# Patient Record
Sex: Male | Born: 1940
Health system: Southern US, Community
[De-identification: ages and names within clinical notes are randomized; demographics above are authoritative.]

## PROBLEM LIST (undated history)

## (undated) DIAGNOSIS — D689 Coagulation defect, unspecified: Secondary | ICD-10-CM

## (undated) DIAGNOSIS — H409 Unspecified glaucoma: Secondary | ICD-10-CM

## (undated) DIAGNOSIS — C801 Malignant (primary) neoplasm, unspecified: Secondary | ICD-10-CM

## (undated) DIAGNOSIS — E785 Hyperlipidemia, unspecified: Secondary | ICD-10-CM

## (undated) DIAGNOSIS — M1990 Unspecified osteoarthritis, unspecified site: Secondary | ICD-10-CM

## (undated) DIAGNOSIS — I509 Heart failure, unspecified: Secondary | ICD-10-CM

## (undated) DIAGNOSIS — I10 Essential (primary) hypertension: Secondary | ICD-10-CM

## (undated) DIAGNOSIS — K219 Gastro-esophageal reflux disease without esophagitis: Secondary | ICD-10-CM

## (undated) DIAGNOSIS — T7840XA Allergy, unspecified, initial encounter: Secondary | ICD-10-CM

## (undated) HISTORY — PX: TRANSURETHRAL RESECTION OF PROSTATE: SHX73

## (undated) HISTORY — DX: Coagulation defect, unspecified: D68.9

## (undated) HISTORY — PX: COLONOSCOPY: SHX174

## (undated) HISTORY — DX: Hyperlipidemia, unspecified: E78.5

## (undated) HISTORY — DX: Allergy, unspecified, initial encounter: T78.40XA

## (undated) HISTORY — PX: NECK SURGERY: SHX720

## (undated) HISTORY — PX: PROSTATE SURGERY: SHX751

## (undated) HISTORY — PX: EYE SURGERY: SHX253

## (undated) HISTORY — PX: BACK SURGERY: SHX140

## (undated) HISTORY — DX: Heart failure, unspecified: I50.9

---

## 2004-05-04 ENCOUNTER — Inpatient Hospital Stay (HOSPITAL_COMMUNITY): Admission: EM | Admit: 2004-05-04 | Discharge: 2004-05-06 | Payer: Self-pay | Admitting: Emergency Medicine

## 2004-05-13 ENCOUNTER — Encounter: Admission: RE | Admit: 2004-05-13 | Discharge: 2004-05-13 | Payer: Self-pay | Admitting: Family Medicine

## 2005-06-09 ENCOUNTER — Inpatient Hospital Stay (HOSPITAL_COMMUNITY): Admission: RE | Admit: 2005-06-09 | Discharge: 2005-06-10 | Payer: Self-pay | Admitting: Neurosurgery

## 2006-01-22 ENCOUNTER — Inpatient Hospital Stay (HOSPITAL_COMMUNITY): Admission: RE | Admit: 2006-01-22 | Discharge: 2006-01-24 | Payer: Self-pay | Admitting: Neurosurgery

## 2008-09-30 ENCOUNTER — Emergency Department (HOSPITAL_COMMUNITY): Admission: EM | Admit: 2008-09-30 | Discharge: 2008-09-30 | Payer: Self-pay | Admitting: Emergency Medicine

## 2008-10-10 ENCOUNTER — Ambulatory Visit (HOSPITAL_BASED_OUTPATIENT_CLINIC_OR_DEPARTMENT_OTHER): Admission: RE | Admit: 2008-10-10 | Discharge: 2008-10-10 | Payer: Self-pay | Admitting: Urology

## 2009-04-28 ENCOUNTER — Emergency Department (HOSPITAL_COMMUNITY): Admission: EM | Admit: 2009-04-28 | Discharge: 2009-04-28 | Payer: Self-pay | Admitting: Emergency Medicine

## 2009-11-06 ENCOUNTER — Ambulatory Visit (HOSPITAL_BASED_OUTPATIENT_CLINIC_OR_DEPARTMENT_OTHER): Admission: RE | Admit: 2009-11-06 | Discharge: 2009-11-06 | Payer: Self-pay | Admitting: Surgery

## 2009-11-06 HISTORY — PX: HERNIA REPAIR: SHX51

## 2010-07-24 LAB — CBC AND DIFFERENTIAL
HCT: 46 % (ref 41–53)
Hemoglobin: 15.1 g/dL (ref 13.5–17.5)
Platelets: 247 10*3/uL (ref 150–399)

## 2010-07-24 LAB — BASIC METABOLIC PANEL
Glucose: 87 mg/dL
Sodium: 139 mmol/L (ref 137–147)

## 2010-07-24 LAB — LIPID PANEL
HDL: 72 mg/dL — AB (ref 35–70)
LDL Cholesterol: 137 mg/dL
Triglycerides: 74 mg/dL (ref 40–160)

## 2010-07-24 LAB — VITAMIN D 25 HYDROXY (VIT D DEFICIENCY, FRACTURES): Vit D, 25-Hydroxy: 37

## 2010-07-24 LAB — PSA, TOTAL AND FREE: PSA: 5.7 ng/mL

## 2010-07-24 LAB — HEPATIC FUNCTION PANEL
AST: 16 U/L (ref 14–40)
Alkaline Phosphatase: 107 U/L (ref 25–125)

## 2010-11-25 ENCOUNTER — Ambulatory Visit
Admission: RE | Admit: 2010-11-25 | Discharge: 2010-12-17 | Payer: Self-pay | Source: Home / Self Care | Attending: Radiation Oncology | Admitting: Radiation Oncology

## 2010-11-25 ENCOUNTER — Ambulatory Visit (HOSPITAL_COMMUNITY): Admission: RE | Admit: 2010-11-25 | Payer: Self-pay | Source: Home / Self Care | Admitting: Radiation Oncology

## 2010-12-13 ENCOUNTER — Encounter
Admission: RE | Admit: 2010-12-13 | Discharge: 2010-12-13 | Payer: Self-pay | Source: Home / Self Care | Attending: Urology | Admitting: Urology

## 2010-12-18 ENCOUNTER — Ambulatory Visit: Payer: Medicare Other | Admitting: Radiation Oncology

## 2010-12-31 LAB — CBC
HCT: 45.4 % (ref 39.0–52.0)
Hemoglobin: 16 g/dL (ref 13.0–17.0)
MCH: 32.8 pg (ref 26.0–34.0)
MCV: 93 fL (ref 78.0–100.0)
RBC: 4.88 MIL/uL (ref 4.22–5.81)

## 2010-12-31 LAB — COMPREHENSIVE METABOLIC PANEL
ALT: 19 U/L (ref 0–53)
Albumin: 4 g/dL (ref 3.5–5.2)
BUN: 17 mg/dL (ref 6–23)
CO2: 30 mEq/L (ref 19–32)
Calcium: 10.5 mg/dL (ref 8.4–10.5)
Chloride: 103 mEq/L (ref 96–112)
Creatinine, Ser: 1.28 mg/dL (ref 0.4–1.5)
Potassium: 4.7 mEq/L (ref 3.5–5.1)
Sodium: 141 mEq/L (ref 135–145)
Total Protein: 7.4 g/dL (ref 6.0–8.3)

## 2010-12-31 LAB — PROTIME-INR: Prothrombin Time: 13.2 seconds (ref 11.6–15.2)

## 2011-01-07 ENCOUNTER — Ambulatory Visit (HOSPITAL_BASED_OUTPATIENT_CLINIC_OR_DEPARTMENT_OTHER)
Admission: RE | Admit: 2011-01-07 | Discharge: 2011-01-07 | Disposition: A | Discharge status: Home / Self Care | Payer: Medicare Other | Attending: Urology | Admitting: Urology

## 2011-01-07 ENCOUNTER — Ambulatory Visit (HOSPITAL_COMMUNITY): Payer: Medicare Other | Attending: Urology

## 2011-01-07 ENCOUNTER — Ambulatory Visit (HOSPITAL_COMMUNITY): Payer: Medicare Other

## 2011-01-07 DIAGNOSIS — Z01812 Encounter for preprocedural laboratory examination: Secondary | ICD-10-CM | POA: Insufficient documentation

## 2011-01-07 DIAGNOSIS — J9819 Other pulmonary collapse: Secondary | ICD-10-CM | POA: Insufficient documentation

## 2011-01-07 DIAGNOSIS — Z9889 Other specified postprocedural states: Secondary | ICD-10-CM | POA: Insufficient documentation

## 2011-01-07 DIAGNOSIS — Z01818 Encounter for other preprocedural examination: Secondary | ICD-10-CM | POA: Insufficient documentation

## 2011-01-07 DIAGNOSIS — C61 Malignant neoplasm of prostate: Secondary | ICD-10-CM | POA: Insufficient documentation

## 2011-01-09 ENCOUNTER — Emergency Department (HOSPITAL_COMMUNITY): Payer: Medicare Other

## 2011-01-09 ENCOUNTER — Emergency Department (HOSPITAL_COMMUNITY)
Admission: EM | Admit: 2011-01-09 | Discharge: 2011-01-09 | Disposition: A | Discharge status: Home / Self Care | Payer: Medicare Other | Attending: Emergency Medicine | Admitting: Emergency Medicine

## 2011-01-09 DIAGNOSIS — I749 Embolism and thrombosis of unspecified artery: Secondary | ICD-10-CM | POA: Insufficient documentation

## 2011-01-09 DIAGNOSIS — I10 Essential (primary) hypertension: Secondary | ICD-10-CM | POA: Insufficient documentation

## 2011-01-09 DIAGNOSIS — R079 Chest pain, unspecified: Secondary | ICD-10-CM | POA: Insufficient documentation

## 2011-01-09 DIAGNOSIS — C61 Malignant neoplasm of prostate: Secondary | ICD-10-CM | POA: Insufficient documentation

## 2011-01-09 DIAGNOSIS — Y849 Medical procedure, unspecified as the cause of abnormal reaction of the patient, or of later complication, without mention of misadventure at the time of the procedure: Secondary | ICD-10-CM | POA: Insufficient documentation

## 2011-01-09 DIAGNOSIS — R911 Solitary pulmonary nodule: Secondary | ICD-10-CM | POA: Insufficient documentation

## 2011-01-09 DIAGNOSIS — R05 Cough: Secondary | ICD-10-CM | POA: Insufficient documentation

## 2011-01-09 DIAGNOSIS — T85898A Other specified complication of other internal prosthetic devices, implants and grafts, initial encounter: Secondary | ICD-10-CM | POA: Insufficient documentation

## 2011-01-09 DIAGNOSIS — R059 Cough, unspecified: Secondary | ICD-10-CM | POA: Insufficient documentation

## 2011-01-09 LAB — URINALYSIS, ROUTINE W REFLEX MICROSCOPIC
Bilirubin Urine: NEGATIVE
Ketones, ur: NEGATIVE mg/dL
Leukocytes, UA: NEGATIVE
Nitrite: NEGATIVE
Protein, ur: NEGATIVE mg/dL
Specific Gravity, Urine: 1.012 (ref 1.005–1.030)
Urine Glucose, Fasting: NEGATIVE mg/dL
Urobilinogen, UA: 0.2 mg/dL (ref 0.0–1.0)
pH: 7 (ref 5.0–8.0)

## 2011-01-09 LAB — CBC
HCT: 44.3 % (ref 39.0–52.0)
Hemoglobin: 15.1 g/dL (ref 13.0–17.0)
MCH: 32.3 pg (ref 26.0–34.0)
MCHC: 34.1 g/dL (ref 30.0–36.0)
MCV: 94.9 fL (ref 78.0–100.0)
Platelets: 225 K/uL (ref 150–400)
RBC: 4.67 MIL/uL (ref 4.22–5.81)
RDW: 14.7 % (ref 11.5–15.5)
WBC: 10 K/uL (ref 4.0–10.5)

## 2011-01-09 LAB — DIFFERENTIAL
Eosinophils Absolute: 0.2 10*3/uL (ref 0.0–0.7)
Neutrophils Relative %: 60 % (ref 43–77)

## 2011-01-09 LAB — LIPASE, BLOOD: Lipase: 17 U/L (ref 11–59)

## 2011-01-09 LAB — COMPREHENSIVE METABOLIC PANEL WITH GFR
ALT: 17 U/L (ref 0–53)
AST: 24 U/L (ref 0–37)
Albumin: 3.6 g/dL (ref 3.5–5.2)
Alkaline Phosphatase: 87 U/L (ref 39–117)
BUN: 11 mg/dL (ref 6–23)
CO2: 29 meq/L (ref 19–32)
Calcium: 9.6 mg/dL (ref 8.4–10.5)
Chloride: 103 meq/L (ref 96–112)
Creatinine, Ser: 1.16 mg/dL (ref 0.4–1.5)
GFR calc Af Amer: >60 mL/min (ref >60)
GFR calc non Af Amer: >60 mL/min (ref >60)
Glucose, Bld: 88 mg/dL (ref 70–99)
Potassium: 3.9 meq/L (ref 3.5–5.1)
Sodium: 138 meq/L (ref 135–145)
Total Bilirubin: 1 mg/dL (ref 0.3–1.2)
Total Protein: 7.3 g/dL (ref 6.0–8.3)

## 2011-01-09 LAB — URINE MICROSCOPIC-ADD ON

## 2011-01-09 MED ORDER — IOHEXOL 300 MG/ML  SOLN: 100.0000 mL | Freq: Once | INTRAMUSCULAR | Status: DC | PRN

## 2011-01-09 NOTE — Op Note (Signed)
NAME:  Joshua Compton, Joshua Compton NO.:  [id number]  MEDICAL RECORD NO.:  [id number]           PATIENT TYPE:  LOCATION:                                 FACILITY:  PHYSICIAN:  Excell Seltzer. Annabell Howells, M.D.    DATE OF BIRTH:  1941/06/02  DATE OF PROCEDURE:  01/08/2011 DATE OF DISCHARGE:                              OPERATIVE REPORT   PROCEDURE:  Prostate seed implantation.  PREOPERATIVE DIAGNOSIS:  T2A Gleason 6 adenocarcinoma of the prostate.  POSTOPERATIVE DIAGNOSIS:  T2A Gleason 6 adenocarcinoma of the prostate.  SURGEON:  Excell Seltzer. Annabell Howells, M.D.  ANESTHESIA:  General.  DRAIN:  Foley catheter.  COMPLICATIONS:  None.  INDICATIONS:  Mr. Joshua Compton is a 70 year old African-American male with a T28 Gleason 6 adenocarcinoma the prostate.  His prostate measures 40 cc and he has elected seed implantation.  FINDINGS OF PROCEDURE:  He was given Cipro and taken to the operating room where he was fitted with PAS hose.  A general anesthetic wasinduced.  He was placed in a lithotomy position.  His genitalia was prepped with Betadine solution and a Foley catheter was inserted.  The balloon was filled with dilute contrast.  A dressing was applied to the genitalia to reflect the scrotum out of the perineum.  The perineum was then clipped and a red rubber rectal catheter was placed.  The ultrasound probe was assembled and inserted and secured to the fixed base.  Once in proper position, the perineum was prepped with Betadine solution and a sterile needle grid was placed on the ultrasound carrier.  At this point, Dr. Roselind Messier and the radiation physicist performed prostate imaging with contouring of the prostate to perform the treatment plan with a Nucletron.  During this procedure, there were some issues imaging the base the prostate, so I was asked to assist.  The bladder was filled with approximately 100 cc of fluid which helped to better delineate the base of the prostate.  He had an  intravesical protrusion that was anterior from the prostate which made imaging of the base difficult until the fluid was in the bladder.  However, once this maneuver had been completed, the contour was performed successfully and the treatment plan was created.  At this point, the patient was draped in usual sterile fashion and the seed implant was performed.  Of note, stabilization needles had been placed prior to contouring.  A total of 26 needles were used with total of 71 iodine-125 seeds were placed according to the intraoperative treatment plan.  The implant was uneventful.  At completion of the implant, the stabilization needles were removed. The ultrasound probe was removed and fluoroscopic imaging was obtained to determine the initial seed placement.  Once this had been completed, the genital drapes were removed.  The penis was reprepped after Foley had been removed and cystoscopy was performed.  This study revealed a normal urethra.  The external sphincter was intact.  The prostatic urethra had no evidence of bleeding or seeds.  There was some bilobar hyperplasia with mild obstruction and an anterior middle lobe.  Examination of bladder revealed mild trabeculation.  No tumors, stones or retained  seeds were noted. Ureteral orifices were unremarkable.  The cystoscope was removed and a fresh 16-French Foley catheter was inserted.  Balloon was filled with 10 cc of sterile fluid and the catheter was placed to straight drainage.  At this point, the patient was taken down from lithotomy position.  His anesthetic was reversed. He was taken to recovery room in stable condition.  There were no complications during the procedure but the imaging was somewhat difficult requiring the maneuver noted above.     Excell Seltzer. Annabell Howells, M.D.     JJW/MEDQ  D:  01/08/2011  T:  01/08/2011  Job:  [id number]  cc:   Billie Lade, M.D. Fax: [phone]  Electronically Signed by Bjorn Pippin M.D. on  01/09/2011 08:20:07 AM

## 2011-02-03 ENCOUNTER — Ambulatory Visit: Payer: Medicare Other | Attending: Radiation Oncology | Admitting: Radiation Oncology

## 2011-02-04 ENCOUNTER — Encounter: Payer: Self-pay | Admitting: Family Medicine

## 2011-02-04 DIAGNOSIS — C61 Malignant neoplasm of prostate: Secondary | ICD-10-CM | POA: Insufficient documentation

## 2011-02-04 DIAGNOSIS — K922 Gastrointestinal hemorrhage, unspecified: Secondary | ICD-10-CM | POA: Insufficient documentation

## 2011-02-04 DIAGNOSIS — I10 Essential (primary) hypertension: Secondary | ICD-10-CM | POA: Insufficient documentation

## 2011-02-04 DIAGNOSIS — Z8546 Personal history of malignant neoplasm of prostate: Secondary | ICD-10-CM | POA: Insufficient documentation

## 2011-02-04 DIAGNOSIS — IMO0002 Reserved for concepts with insufficient information to code with codable children: Secondary | ICD-10-CM | POA: Insufficient documentation

## 2011-02-04 DIAGNOSIS — D649 Anemia, unspecified: Secondary | ICD-10-CM | POA: Insufficient documentation

## 2011-02-17 ENCOUNTER — Ambulatory Visit: Payer: Medicare Other | Attending: Radiation Oncology | Admitting: Radiation Oncology

## 2011-02-17 DIAGNOSIS — C61 Malignant neoplasm of prostate: Secondary | ICD-10-CM | POA: Insufficient documentation

## 2011-02-17 DIAGNOSIS — J984 Other disorders of lung: Secondary | ICD-10-CM | POA: Insufficient documentation

## 2011-02-17 LAB — BASIC METABOLIC PANEL
BUN: 14 mg/dL (ref 6–23)
CO2: 29 mEq/L (ref 19–32)
Calcium: 9.7 mg/dL (ref 8.4–10.5)
Chloride: 100 mEq/L (ref 96–112)
Creatinine, Ser: 0.99 mg/dL (ref 0.4–1.5)
GFR calc Af Amer: >60 mL/min (ref >60)
GFR calc non Af Amer: >60 mL/min (ref >60)
Glucose, Bld: 96 mg/dL (ref 70–99)
Potassium: 4.6 mEq/L (ref 3.5–5.1)
Sodium: 137 mEq/L (ref 135–145)

## 2011-02-17 LAB — POCT HEMOGLOBIN-HEMACUE: Hemoglobin: 14.1 g/dL (ref 13.0–17.0)

## 2011-04-01 NOTE — Op Note (Signed)
NAME:  Joshua Compton, Joshua Compton           ACCOUNT NO.:  [id number]   MEDICAL RECORD NO.:  [id number]          PATIENT TYPE:  AMB   LOCATION:  [facility]                         FACILITY:  [facility]   PHYSICIAN:  Excell Seltzer. Annabell Howells, M.D.    DATE OF BIRTH:  August 07, 1941   DATE OF PROCEDURE:  10/10/2008  DATE OF DISCHARGE:                               OPERATIVE REPORT   PROCEDURE:  Transurethral electrovaporization of the prostate.   PREOPERATIVE DIAGNOSES:  1. Benign prostatic hypertrophy.  2. Bladder outlet obstruction.   POSTOPERATIVE DIAGNOSES:  1. Benign prostatic hypertrophy.  2. Bladder outlet obstruction.   SURGEON:  Dr. Bjorn Pippin.   ANESTHESIA:  General.   DRAIN:  A 20-French Foley catheter.   COMPLICATIONS:  None.   INDICATIONS:  Joshua Compton is a 70 year old African American male with  urinary retention who was found to have a tight bladder neck with  obstruction, and it was felt that he would benefit from TUIP or possibly  TURP.  He elected surgical therapy over medical therapy.   FINDINGS AND PROCEDURE:  The patient was taken to the operating room.  He was given Cipro.  A general anesthetic was induced.  He was placed in  lithotomy position.  His Foley catheter was removed.  He was prepped  with Betadine solution and draped in the usual sterile fashion.  His  urethra was calibrated to 30-French with Sissy Hoff sounds and a 28-  French continuous flow resectoscope sheath was inserted without  difficulty.  This was fitted with an Wandra Scot handle with a Gyrus button  electrode and a 12 degrees lens.  The irrigant was saline.  Inspection  revealed a tight bladder neck as noted above.  He had some catheter  irritation in the bladder, particularly on the posterior wall.  The  ureteral orifices were well away from the bladder neck.   Once inspection had been completed, the resection was initiated with an  initial pass at 6 o'clock.  This was carried down to the bladder neck  fibers and the  lateral lobes then fell in and I felt simple TUIP was not  to be adequate for opening the channel.  He then underwent  electrovaporization of the right and left lateral lobes, sufficient to  create an adequate channel for urine flow.  The posterior channel was  deepened from the bladder neck fibers and carried out to alongside the  verumontanum on each side.  Once an adequate channel had been created  with electrovaporization, the ablated surface was cauterized on the  cautery setting to ensure hemostasis.  At this point, the resectoscope  was removed and pressure on the bladder produced an excellent stream.  A  20-French Foley catheter was inserted.  The  balloon was filled with 10 mL of sterile fluid.  The catheter was  irrigated with an Asepto syringe with clear return.  A B and O  suppository was placed.  The patient was taken down from lithotomy  position.  His anesthetic was reversed.  He was moved to the recovery  room in stable condition.  There were no complications.  Excell Seltzer. Annabell Howells, M.D.  Electronically Signed     JJW/MEDQ  D:  10/10/2008  T:  10/11/2008  Job:  [id number]

## 2011-08-20 LAB — URINALYSIS, ROUTINE W REFLEX MICROSCOPIC
Bilirubin Urine: NEGATIVE
Glucose, UA: NEGATIVE
Hgb urine dipstick: NEGATIVE
Ketones, ur: NEGATIVE
Nitrite: NEGATIVE
Protein, ur: NEGATIVE
Specific Gravity, Urine: 1.014
Urobilinogen, UA: 0.2
pH: 6.5

## 2011-08-20 LAB — DIFFERENTIAL
Basophils Absolute: 0.1
Basophils Relative: 1
Eosinophils Absolute: 0.2
Eosinophils Relative: 3
Lymphocytes Relative: 36
Lymphs Abs: 2.5
Monocytes Absolute: 0.5
Monocytes Relative: 7
Neutro Abs: 3.8
Neutrophils Relative %: 53

## 2011-08-20 LAB — COMPREHENSIVE METABOLIC PANEL
ALT: 14
AST: 20
Albumin: 3.9
Alkaline Phosphatase: 90
BUN: 9
CO2: 26
Calcium: 9.9
Chloride: 102
Creatinine, Ser: 1.15
GFR calc Af Amer: >60
GFR calc non Af Amer: >60
Glucose, Bld: 93
Potassium: 3.8
Sodium: 137
Total Bilirubin: 0.9
Total Protein: 6.9

## 2011-08-20 LAB — LIPASE, BLOOD: Lipase: 15

## 2011-08-20 LAB — CBC
HCT: 43.5
Hemoglobin: 14.3
MCHC: 32.8
MCV: 96.3
Platelets: 278
RBC: 4.52
RDW: 14.7
WBC: 7.2

## 2011-08-20 LAB — POCT I-STAT, CHEM 8
BUN: 16
Calcium, Ion: 1.17
Chloride: 104
Creatinine, Ser: 1.1
Glucose, Bld: 84
HCT: 45
Hemoglobin: 15.3
Potassium: 4
Sodium: 140
TCO2: 29

## 2011-08-25 ENCOUNTER — Ambulatory Visit: Payer: Medicare Other | Admitting: Radiation Oncology

## 2011-10-27 DIAGNOSIS — H401190 Primary open-angle glaucoma, unspecified eye, stage unspecified: Secondary | ICD-10-CM | POA: Insufficient documentation

## 2012-03-26 DIAGNOSIS — H2510 Age-related nuclear cataract, unspecified eye: Secondary | ICD-10-CM | POA: Insufficient documentation

## 2012-03-26 DIAGNOSIS — H409 Unspecified glaucoma: Secondary | ICD-10-CM | POA: Insufficient documentation

## 2012-04-29 ENCOUNTER — Other Ambulatory Visit: Payer: Self-pay | Admitting: Neurosurgery

## 2012-04-29 DIAGNOSIS — IMO0002 Reserved for concepts with insufficient information to code with codable children: Secondary | ICD-10-CM

## 2012-05-12 ENCOUNTER — Ambulatory Visit
Admission: RE | Admit: 2012-05-12 | Discharge: 2012-05-12 | Disposition: A | Discharge status: Home / Self Care | Payer: Medicare Other | Source: Ambulatory Visit | Attending: Neurosurgery | Admitting: Neurosurgery

## 2012-05-12 DIAGNOSIS — IMO0002 Reserved for concepts with insufficient information to code with codable children: Secondary | ICD-10-CM

## 2012-05-12 MED ORDER — GADOBENATE DIMEGLUMINE 529 MG/ML IV SOLN
19.0000 mL | Freq: Once | INTRAVENOUS | Status: AC | PRN
  Administered 2012-05-12: 19 mL via INTRAVENOUS

## 2012-05-17 ENCOUNTER — Other Ambulatory Visit: Payer: Self-pay | Admitting: Neurosurgery

## 2012-06-03 ENCOUNTER — Encounter (HOSPITAL_COMMUNITY)
Admission: RE | Admit: 2012-06-03 | Discharge: 2012-06-03 | Disposition: A | Discharge status: Home / Self Care | Payer: Medicare Other | Source: Ambulatory Visit | Attending: Neurosurgery | Admitting: Neurosurgery

## 2012-06-03 ENCOUNTER — Encounter (HOSPITAL_COMMUNITY): Payer: Self-pay

## 2012-06-03 ENCOUNTER — Other Ambulatory Visit: Payer: Self-pay | Admitting: Neurosurgery

## 2012-06-03 DIAGNOSIS — M48061 Spinal stenosis, lumbar region without neurogenic claudication: Secondary | ICD-10-CM

## 2012-06-03 DIAGNOSIS — M47817 Spondylosis without myelopathy or radiculopathy, lumbosacral region: Secondary | ICD-10-CM

## 2012-06-03 DIAGNOSIS — M5137 Other intervertebral disc degeneration, lumbosacral region: Secondary | ICD-10-CM

## 2012-06-03 DIAGNOSIS — M51379 Other intervertebral disc degeneration, lumbosacral region without mention of lumbar back pain or lower extremity pain: Secondary | ICD-10-CM

## 2012-06-03 HISTORY — DX: Unspecified glaucoma: H40.9

## 2012-06-03 HISTORY — DX: Essential (primary) hypertension: I10

## 2012-06-03 HISTORY — DX: Malignant (primary) neoplasm, unspecified: C80.1

## 2012-06-03 HISTORY — DX: Unspecified osteoarthritis, unspecified site: M19.90

## 2012-06-03 LAB — TYPE AND SCREEN
ABO/RH(D): O POS
Antibody Screen: NEGATIVE

## 2012-06-03 LAB — CBC
HCT: 42.4 % (ref 39.0–52.0)
Hemoglobin: 14.6 g/dL (ref 13.0–17.0)
MCH: 31.4 pg (ref 26.0–34.0)
MCHC: 34.4 g/dL (ref 30.0–36.0)
MCV: 91.2 fL (ref 78.0–100.0)
Platelets: 258 10*3/uL (ref 150–400)
RBC: 4.65 MIL/uL (ref 4.22–5.81)
RDW: 14.5 % (ref 11.5–15.5)
WBC: 6.9 10*3/uL (ref 4.0–10.5)

## 2012-06-03 LAB — SURGICAL PCR SCREEN
MRSA, PCR: NEGATIVE
Staphylococcus aureus: NEGATIVE

## 2012-06-03 LAB — BASIC METABOLIC PANEL
BUN: 20 mg/dL (ref 6–23)
CO2: 26 mEq/L (ref 19–32)
Calcium: 9.8 mg/dL (ref 8.4–10.5)
Chloride: 101 mEq/L (ref 96–112)
Creatinine, Ser: 1.01 mg/dL (ref 0.50–1.35)
GFR calc Af Amer: 84 mL/min — ABNORMAL LOW (ref >90)
GFR calc non Af Amer: 73 mL/min — ABNORMAL LOW (ref >90)
Glucose, Bld: 82 mg/dL (ref 70–99)
Potassium: 4 mEq/L (ref 3.5–5.1)
Sodium: 137 mEq/L (ref 135–145)

## 2012-06-03 LAB — ABO/RH: ABO/RH(D): O POS

## 2012-06-03 NOTE — Pre-Procedure Instructions (Signed)
20 Joshua Compton  06/03/2012   Your procedure is scheduled on:  06/09/12  Report to [facility] at 630 AM.  Call this number if you have problems the morning of surgery: [phone]   Remember:   Do not eat food:After Midnight.  May have clear liquids:until Midnight .  Clear liquids include soda, tea, black coffee, apple or grape juice, broth.  Take these medicines the morning of surgery with A SIP OF WATER: plendil   Do not wear jewelry, make-up or nail polish.  Do not wear lotions, powders, or perfumes. You may wear deodorant.  Do not shave 48 hours prior to surgery. Men may shave face and neck.  Do not bring valuables to the hospital.  Contacts, dentures or bridgework may not be worn into surgery.  Leave suitcase in the car. After surgery it may be brought to your room.  For patients admitted to the hospital, checkout time is 11:00 AM the day of discharge.   Patients discharged the day of surgery will not be allowed to drive home.  Name and phone number of your driver: family  Special Instructions: CHG Shower Use Special Wash: 1/2 bottle night before surgery and 1/2 bottle morning of surgery.   Please read over the following fact sheets that you were given: Pain Booklet, Coughing and Deep Breathing, Blood Transfusion Information, MRSA Information and Surgical Site Infection Prevention

## 2012-06-04 ENCOUNTER — Ambulatory Visit
Admission: RE | Admit: 2012-06-04 | Discharge: 2012-06-04 | Disposition: A | Discharge status: Home / Self Care | Payer: Medicare Other | Source: Ambulatory Visit | Attending: Neurosurgery | Admitting: Neurosurgery

## 2012-06-04 DIAGNOSIS — M48061 Spinal stenosis, lumbar region without neurogenic claudication: Secondary | ICD-10-CM

## 2012-06-04 DIAGNOSIS — M5137 Other intervertebral disc degeneration, lumbosacral region: Secondary | ICD-10-CM

## 2012-06-04 DIAGNOSIS — M47817 Spondylosis without myelopathy or radiculopathy, lumbosacral region: Secondary | ICD-10-CM

## 2012-06-08 MED ORDER — CEFAZOLIN SODIUM-DEXTROSE 2-3 GM-% IV SOLR
2.0000 g | INTRAVENOUS | Status: AC
  Administered 2012-06-09 (×2): 2 g via INTRAVENOUS
  Filled 2012-06-08: qty 50

## 2012-06-09 ENCOUNTER — Encounter (HOSPITAL_COMMUNITY)
Admission: RE | Disposition: A | Discharge status: Home / Self Care | Payer: Self-pay | Source: Ambulatory Visit | Attending: Neurosurgery

## 2012-06-09 ENCOUNTER — Encounter (HOSPITAL_COMMUNITY): Payer: Self-pay | Admitting: Certified Registered"

## 2012-06-09 ENCOUNTER — Inpatient Hospital Stay (HOSPITAL_COMMUNITY)
Admission: RE | Admit: 2012-06-09 | Discharge: 2012-06-10 | DRG: 460 | Disposition: A | Discharge status: Home / Self Care | Payer: Medicare Other | Source: Ambulatory Visit | Attending: Neurosurgery | Admitting: Neurosurgery

## 2012-06-09 ENCOUNTER — Encounter (HOSPITAL_COMMUNITY): Payer: Self-pay | Admitting: *Deleted

## 2012-06-09 ENCOUNTER — Inpatient Hospital Stay (HOSPITAL_COMMUNITY): Payer: Medicare Other

## 2012-06-09 ENCOUNTER — Inpatient Hospital Stay (HOSPITAL_COMMUNITY): Payer: Medicare Other | Admitting: Certified Registered"

## 2012-06-09 DIAGNOSIS — K219 Gastro-esophageal reflux disease without esophagitis: Secondary | ICD-10-CM | POA: Diagnosis present

## 2012-06-09 DIAGNOSIS — Z01812 Encounter for preprocedural laboratory examination: Secondary | ICD-10-CM

## 2012-06-09 DIAGNOSIS — I10 Essential (primary) hypertension: Secondary | ICD-10-CM | POA: Diagnosis present

## 2012-06-09 DIAGNOSIS — Z8546 Personal history of malignant neoplasm of prostate: Secondary | ICD-10-CM

## 2012-06-09 DIAGNOSIS — Z87891 Personal history of nicotine dependence: Secondary | ICD-10-CM

## 2012-06-09 DIAGNOSIS — M5137 Other intervertebral disc degeneration, lumbosacral region: Principal | ICD-10-CM | POA: Diagnosis present

## 2012-06-09 DIAGNOSIS — M51379 Other intervertebral disc degeneration, lumbosacral region without mention of lumbar back pain or lower extremity pain: Principal | ICD-10-CM | POA: Diagnosis present

## 2012-06-09 SURGERY — POSTERIOR LUMBAR FUSION 1 LEVEL
Anesthesia: General | Site: Back | Wound class: Clean

## 2012-06-09 MED ORDER — ENALAPRIL-HYDROCHLOROTHIAZIDE 5-12.5 MG PO TABS: 1.0000 | ORAL_TABLET | Freq: Every day | ORAL | Status: DC

## 2012-06-09 MED ORDER — KETOROLAC TROMETHAMINE 30 MG/ML IJ SOLN
INTRAMUSCULAR | Status: AC
  Filled 2012-06-09: qty 1

## 2012-06-09 MED ORDER — PHENYLEPHRINE HCL 10 MG/ML IJ SOLN
INTRAMUSCULAR | Status: DC | PRN
  Administered 2012-06-09 (×4): 40 ug via INTRAVENOUS

## 2012-06-09 MED ORDER — HETASTARCH-ELECTROLYTES 6 % IV SOLN
INTRAVENOUS | Status: DC | PRN
  Administered 2012-06-09: 11:00:00 via INTRAVENOUS

## 2012-06-09 MED ORDER — OXYCODONE HCL 5 MG PO TABS: 5.0000 mg | ORAL_TABLET | ORAL | Status: DC | PRN

## 2012-06-09 MED ORDER — TIMOLOL HEMIHYDRATE 0.5 % OP SOLN
1.0000 [drp] | Freq: Every day | OPHTHALMIC | Status: DC
Start: 2012-06-09 — End: 2012-06-09

## 2012-06-09 MED ORDER — LIDOCAINE HCL (CARDIAC) 20 MG/ML IV SOLN
INTRAVENOUS | Status: DC | PRN
  Administered 2012-06-09: 60 mg via INTRAVENOUS

## 2012-06-09 MED ORDER — FENTANYL CITRATE 0.05 MG/ML IJ SOLN
INTRAMUSCULAR | Status: DC | PRN
Start: 2012-06-09 — End: 2012-06-09
  Administered 2012-06-09: 50 ug via INTRAVENOUS
  Administered 2012-06-09: 100 ug via INTRAVENOUS
  Administered 2012-06-09 (×4): 50 ug via INTRAVENOUS
  Administered 2012-06-09: 100 ug via INTRAVENOUS
  Administered 2012-06-09 (×2): 50 ug via INTRAVENOUS

## 2012-06-09 MED ORDER — ENALAPRIL MALEATE 5 MG PO TABS
5.0000 mg | ORAL_TABLET | Freq: Every day | ORAL | Status: DC
  Filled 2012-06-09: qty 1

## 2012-06-09 MED ORDER — BACITRACIN 50000 UNITS IM SOLR
INTRAMUSCULAR | Status: AC
  Filled 2012-06-09: qty 1

## 2012-06-09 MED ORDER — THROMBIN 20000 UNITS EX KIT
PACK | CUTANEOUS | Status: DC | PRN
  Administered 2012-06-09: 20000 [IU] via TOPICAL

## 2012-06-09 MED ORDER — OXYCODONE-ACETAMINOPHEN 5-325 MG PO TABS: 1.0000 | ORAL_TABLET | ORAL | Status: DC | PRN

## 2012-06-09 MED ORDER — SODIUM CHLORIDE 0.9 % IJ SOLN: 3.0000 mL | INTRAMUSCULAR | Status: DC | PRN

## 2012-06-09 MED ORDER — ACETAMINOPHEN 325 MG PO TABS: 650.0000 mg | ORAL_TABLET | ORAL | Status: DC | PRN

## 2012-06-09 MED ORDER — ALUM & MAG HYDROXIDE-SIMETH 200-200-20 MG/5ML PO SUSP
30.0000 mL | Freq: Four times a day (QID) | ORAL | Status: DC | PRN
  Administered 2012-06-10: 30 mL via ORAL
  Filled 2012-06-09: qty 30

## 2012-06-09 MED ORDER — PROPOFOL 10 MG/ML IV EMUL
INTRAVENOUS | Status: DC | PRN
  Administered 2012-06-09: 200 mg via INTRAVENOUS

## 2012-06-09 MED ORDER — SODIUM CHLORIDE 0.9 % IV SOLN
INTRAVENOUS | Status: AC
  Filled 2012-06-09: qty 500

## 2012-06-09 MED ORDER — ACETAMINOPHEN 10 MG/ML IV SOLN
INTRAVENOUS | Status: AC
  Filled 2012-06-09: qty 100

## 2012-06-09 MED ORDER — HYDROCHLOROTHIAZIDE 25 MG PO TABS
12.5000 mg | ORAL_TABLET | Freq: Every day | ORAL | Status: DC
  Filled 2012-06-09: qty 0.5

## 2012-06-09 MED ORDER — SODIUM CHLORIDE 0.9 % IR SOLN
Status: DC | PRN
  Administered 2012-06-09: 09:00:00

## 2012-06-09 MED ORDER — PHENOL 1.4 % MT LIQD: 1.0000 | OROMUCOSAL | Status: DC | PRN

## 2012-06-09 MED ORDER — HYDROXYZINE HCL 25 MG PO TABS: 50.0000 mg | ORAL_TABLET | ORAL | Status: DC | PRN

## 2012-06-09 MED ORDER — LACTATED RINGERS IV SOLN
INTRAVENOUS | Status: DC | PRN
  Administered 2012-06-09 (×2): via INTRAVENOUS

## 2012-06-09 MED ORDER — MAGNESIUM HYDROXIDE 400 MG/5ML PO SUSP: 30.0000 mL | Freq: Every day | ORAL | Status: DC | PRN

## 2012-06-09 MED ORDER — HYDROMORPHONE HCL PF 1 MG/ML IJ SOLN
INTRAMUSCULAR | Status: AC
  Filled 2012-06-09: qty 1

## 2012-06-09 MED ORDER — HYDROMORPHONE HCL PF 1 MG/ML IJ SOLN
0.2500 mg | INTRAMUSCULAR | Status: DC | PRN
  Administered 2012-06-09: 0.5 mg via INTRAVENOUS

## 2012-06-09 MED ORDER — BISACODYL 10 MG RE SUPP: 10.0000 mg | Freq: Every day | RECTAL | Status: DC | PRN

## 2012-06-09 MED ORDER — KETOROLAC TROMETHAMINE 30 MG/ML IJ SOLN
30.0000 mg | Freq: Once | INTRAMUSCULAR | Status: AC
  Administered 2012-06-09: 30 mg via INTRAVENOUS

## 2012-06-09 MED ORDER — SIMVASTATIN 5 MG PO TABS
5.0000 mg | ORAL_TABLET | Freq: Every day | ORAL | Status: DC
  Filled 2012-06-09: qty 1

## 2012-06-09 MED ORDER — MORPHINE SULFATE 4 MG/ML IJ SOLN: 4.0000 mg | INTRAMUSCULAR | Status: DC | PRN

## 2012-06-09 MED ORDER — HYDROXYZINE HCL 50 MG/ML IM SOLN: 50.0000 mg | INTRAMUSCULAR | Status: DC | PRN

## 2012-06-09 MED ORDER — CEFAZOLIN SODIUM-DEXTROSE 2-3 GM-% IV SOLR
INTRAVENOUS | Status: AC
  Filled 2012-06-09: qty 50

## 2012-06-09 MED ORDER — FELODIPINE ER 5 MG PO TB24
5.0000 mg | ORAL_TABLET | Freq: Every day | ORAL | Status: DC
  Filled 2012-06-09: qty 1

## 2012-06-09 MED ORDER — SODIUM CHLORIDE 0.9 % IV SOLN
INTRAVENOUS | Status: DC | PRN
  Administered 2012-06-09: 09:00:00 via INTRAVENOUS

## 2012-06-09 MED ORDER — CYCLOBENZAPRINE HCL 10 MG PO TABS: 10.0000 mg | ORAL_TABLET | Freq: Three times a day (TID) | ORAL | Status: DC | PRN

## 2012-06-09 MED ORDER — BIMATOPROST 0.01 % OP SOLN
1.0000 [drp] | Freq: Every day | OPHTHALMIC | Status: DC
  Administered 2012-06-09: 1 [drp] via OPHTHALMIC
  Filled 2012-06-09: qty 2.5

## 2012-06-09 MED ORDER — BIMATOPROST 0.03 % OP SOLN
1.0000 [drp] | Freq: Every day | OPHTHALMIC | Status: DC
  Filled 2012-06-09: qty 2.5

## 2012-06-09 MED ORDER — ACETAMINOPHEN 10 MG/ML IV SOLN
1000.0000 mg | Freq: Four times a day (QID) | INTRAVENOUS | Status: DC
  Administered 2012-06-09 – 2012-06-10 (×3): 1000 mg via INTRAVENOUS
  Filled 2012-06-09 (×4): qty 100

## 2012-06-09 MED ORDER — ACETAMINOPHEN 650 MG RE SUPP: 650.0000 mg | RECTAL | Status: DC | PRN

## 2012-06-09 MED ORDER — ACETAMINOPHEN 10 MG/ML IV SOLN
INTRAVENOUS | Status: DC | PRN
  Administered 2012-06-09: 1000 mg via INTRAVENOUS

## 2012-06-09 MED ORDER — KCL IN DEXTROSE-NACL 20-5-0.45 MEQ/L-%-% IV SOLN
INTRAVENOUS | Status: DC
  Administered 2012-06-09: 1000 mL via INTRAVENOUS
  Filled 2012-06-09 (×5): qty 1000

## 2012-06-09 MED ORDER — BRIMONIDINE TARTRATE 0.2 % OP SOLN
1.0000 [drp] | Freq: Every day | OPHTHALMIC | Status: DC
  Administered 2012-06-09 – 2012-06-10 (×2): 1 [drp] via OPHTHALMIC
  Filled 2012-06-09: qty 5

## 2012-06-09 MED ORDER — MIDAZOLAM HCL 5 MG/5ML IJ SOLN
INTRAMUSCULAR | Status: DC | PRN
  Administered 2012-06-09 (×2): 1 mg via INTRAVENOUS

## 2012-06-09 MED ORDER — ROCURONIUM BROMIDE 100 MG/10ML IV SOLN
INTRAVENOUS | Status: DC | PRN
  Administered 2012-06-09: 20 mg via INTRAVENOUS
  Administered 2012-06-09 (×2): 50 mg via INTRAVENOUS
  Administered 2012-06-09: 20 mg via INTRAVENOUS

## 2012-06-09 MED ORDER — MENTHOL 3 MG MT LOZG: 1.0000 | LOZENGE | OROMUCOSAL | Status: DC | PRN

## 2012-06-09 MED ORDER — KETOROLAC TROMETHAMINE 30 MG/ML IJ SOLN
30.0000 mg | Freq: Four times a day (QID) | INTRAMUSCULAR | Status: DC
  Administered 2012-06-09 – 2012-06-10 (×3): 30 mg via INTRAVENOUS
  Filled 2012-06-09 (×7): qty 1

## 2012-06-09 MED ORDER — KCL IN DEXTROSE-NACL 20-5-0.45 MEQ/L-%-% IV SOLN
INTRAVENOUS | Status: AC
  Filled 2012-06-09: qty 1000

## 2012-06-09 MED ORDER — HEMOSTATIC AGENTS (NO CHARGE) OPTIME
TOPICAL | Status: DC | PRN
  Administered 2012-06-09: 1 via TOPICAL

## 2012-06-09 MED ORDER — GLYCOPYRROLATE 0.2 MG/ML IJ SOLN
INTRAMUSCULAR | Status: DC | PRN
  Administered 2012-06-09: .7 mg via INTRAVENOUS

## 2012-06-09 MED ORDER — SODIUM CHLORIDE 0.9 % IJ SOLN
3.0000 mL | Freq: Two times a day (BID) | INTRAMUSCULAR | Status: DC
  Administered 2012-06-09 – 2012-06-10 (×2): 3 mL via INTRAVENOUS

## 2012-06-09 MED ORDER — TIMOLOL MALEATE 0.5 % OP SOLN
1.0000 [drp] | Freq: Every day | OPHTHALMIC | Status: DC
  Administered 2012-06-09 – 2012-06-10 (×2): 1 [drp] via OPHTHALMIC
  Filled 2012-06-09: qty 5

## 2012-06-09 MED ORDER — SODIUM CHLORIDE 0.9 % IV SOLN: 250.0000 mL | INTRAVENOUS | Status: DC

## 2012-06-09 MED ORDER — LIDOCAINE-EPINEPHRINE 1 %-1:100000 IJ SOLN
INTRAMUSCULAR | Status: DC | PRN
  Administered 2012-06-09: 20 mL

## 2012-06-09 MED ORDER — BUPIVACAINE HCL (PF) 0.5 % IJ SOLN
INTRAMUSCULAR | Status: DC | PRN
  Administered 2012-06-09: 50 mL

## 2012-06-09 MED ORDER — ONDANSETRON HCL 4 MG/2ML IJ SOLN
INTRAMUSCULAR | Status: DC | PRN
  Administered 2012-06-09: 4 mg via INTRAVENOUS

## 2012-06-09 MED ORDER — ZOLPIDEM TARTRATE 5 MG PO TABS: 5.0000 mg | ORAL_TABLET | Freq: Every evening | ORAL | Status: DC | PRN

## 2012-06-09 MED ORDER — HYDROCODONE-ACETAMINOPHEN 5-325 MG PO TABS: 1.0000 | ORAL_TABLET | ORAL | Status: DC | PRN

## 2012-06-09 MED ORDER — NEOSTIGMINE METHYLSULFATE 1 MG/ML IJ SOLN
INTRAMUSCULAR | Status: DC | PRN
  Administered 2012-06-09: 4 mg via INTRAVENOUS

## 2012-06-09 MED ORDER — 0.9 % SODIUM CHLORIDE (POUR BTL) OPTIME
TOPICAL | Status: DC | PRN
  Administered 2012-06-09: 1000 mL

## 2012-06-09 SURGICAL SUPPLY — 80 items
BAG DECANTER FOR FLEXI CONT (MISCELLANEOUS) ×2 IMPLANT
BLADE SURG ROTATE 9660 (MISCELLANEOUS) IMPLANT
BRUSH SCRUB EZ PLAIN DRY (MISCELLANEOUS) ×2 IMPLANT
BUR ACRON 5.0MM COATED (BURR) ×4 IMPLANT
BUR MATCHSTICK NEURO 3.0 LAGG (BURR) ×2 IMPLANT
CANISTER SUCTION 2500CC (MISCELLANEOUS) ×2 IMPLANT
CAP LCK SPNE (Orthopedic Implant) ×4 IMPLANT
CAP LOCK SPINE RADIUS (Orthopedic Implant) ×4 IMPLANT
CAP LOCKING (Orthopedic Implant) ×4 IMPLANT
CLOTH BEACON ORANGE TIMEOUT ST (SAFETY) ×2 IMPLANT
CONT SPEC 4OZ CLIKSEAL STRL BL (MISCELLANEOUS) ×2 IMPLANT
COVER BACK TABLE 24X17X13 BIG (DRAPES) IMPLANT
COVER TABLE BACK 60X90 (DRAPES) ×2 IMPLANT
CROSSLINK MEDIUM (Orthopedic Implant) ×2 IMPLANT
CROSSLINK VARIABLE M-A (Orthopedic Implant) ×2 IMPLANT
DERMABOND ADVANCED (GAUZE/BANDAGES/DRESSINGS) ×3
DERMABOND ADVANCED .7 DNX12 (GAUZE/BANDAGES/DRESSINGS) ×3 IMPLANT
DRAPE C-ARM 42X72 X-RAY (DRAPES) ×4 IMPLANT
DRAPE LAPAROTOMY 100X72X124 (DRAPES) ×2 IMPLANT
DRAPE MICROSCOPE LEICA (MISCELLANEOUS) ×2 IMPLANT
DRAPE POUCH INSTRU U-SHP 10X18 (DRAPES) ×2 IMPLANT
DRAPE PROXIMA HALF (DRAPES) IMPLANT
DRSG EMULSION OIL 3X3 NADH (GAUZE/BANDAGES/DRESSINGS) IMPLANT
DURAFORM COLLAGEN 1X1 5-PACK (Neuro Prosthesis/Implant) ×2 IMPLANT
ELECT REM PT RETURN 9FT ADLT (ELECTROSURGICAL) ×2
ELECTRODE REM PT RTRN 9FT ADLT (ELECTROSURGICAL) ×1 IMPLANT
GAUZE SPONGE 4X4 16PLY XRAY LF (GAUZE/BANDAGES/DRESSINGS) ×2 IMPLANT
GLOVE BIO SURGEON STRL SZ8 (GLOVE) ×4 IMPLANT
GLOVE BIOGEL PI IND STRL 8 (GLOVE) ×2 IMPLANT
GLOVE BIOGEL PI INDICATOR 8 (GLOVE) ×2
GLOVE ECLIPSE 7.5 STRL STRAW (GLOVE) ×10 IMPLANT
GLOVE EXAM NITRILE LRG STRL (GLOVE) IMPLANT
GLOVE EXAM NITRILE MD LF STRL (GLOVE) ×4 IMPLANT
GLOVE EXAM NITRILE XL STR (GLOVE) IMPLANT
GLOVE EXAM NITRILE XS STR PU (GLOVE) IMPLANT
GLOVE INDICATOR 7.0 STRL GRN (GLOVE) ×2 IMPLANT
GLOVE INDICATOR 7.5 STRL GRN (GLOVE) ×6 IMPLANT
GLOVE SKINSENSE NS SZ6.5 (GLOVE) ×2
GLOVE SKINSENSE STRL SZ6.5 (GLOVE) ×2 IMPLANT
GOWN BRE IMP SLV AUR LG STRL (GOWN DISPOSABLE) ×2 IMPLANT
GOWN BRE IMP SLV AUR XL STRL (GOWN DISPOSABLE) ×8 IMPLANT
GOWN STRL REIN 2XL LVL4 (GOWN DISPOSABLE) ×4 IMPLANT
KIT BASIN OR (CUSTOM PROCEDURE TRAY) ×2 IMPLANT
KIT INFUSE SMALL (Orthopedic Implant) ×2 IMPLANT
KIT ROOM TURNOVER OR (KITS) ×2 IMPLANT
MILL MEDIUM DISP (BLADE) ×2 IMPLANT
NEEDLE BONE MARROW 8GAX6 (NEEDLE) ×2 IMPLANT
NEEDLE HYPO 25X1 1.5 SAFETY (NEEDLE) ×2 IMPLANT
NEEDLE SPNL 18GX3.5 QUINCKE PK (NEEDLE) IMPLANT
NS IRRIG 1000ML POUR BTL (IV SOLUTION) ×2 IMPLANT
PACK LAMINECTOMY NEURO (CUSTOM PROCEDURE TRAY) ×2 IMPLANT
PACK VITOSS BIOACTIVE 10CC (Neuro Prosthesis/Implant) ×2 IMPLANT
PACK VITOSS BIOACTIVE 5CC (Neuro Prosthesis/Implant) ×2 IMPLANT
PAD ARMBOARD 7.5X6 YLW CONV (MISCELLANEOUS) ×6 IMPLANT
PATTIES SURGICAL .5 X.5 (GAUZE/BANDAGES/DRESSINGS) ×2 IMPLANT
PATTIES SURGICAL .5 X1 (DISPOSABLE) IMPLANT
PATTIES SURGICAL 1X1 (DISPOSABLE) ×2 IMPLANT
PEEK PLIF AVS 12X25X4 (Peek) ×2 IMPLANT
PEEK PLIF AVS 9X25X4 (Peek) ×2 IMPLANT
ROD RADIUS 40MM (Neuro Prosthesis/Implant) ×1 IMPLANT
ROD RADIUS 45MM (Rod) ×1 IMPLANT
ROD SPNL 40X5.5XNS TI RDS (Neuro Prosthesis/Implant) ×1 IMPLANT
ROD SPNL 45X5.5XNS TI RDS (Rod) ×1 IMPLANT
RUBBERBAND STERILE (MISCELLANEOUS) ×2 IMPLANT
SCREW 5.75X50MM (Screw) ×4 IMPLANT
SPONGE GAUZE 4X4 12PLY (GAUZE/BANDAGES/DRESSINGS) ×2 IMPLANT
SPONGE LAP 4X18 X RAY DECT (DISPOSABLE) IMPLANT
SPONGE NEURO XRAY DETECT 1X3 (DISPOSABLE) IMPLANT
SPONGE SURGIFOAM ABS GEL 100 (HEMOSTASIS) ×2 IMPLANT
SUT PROLENE 6 0 BV (SUTURE) ×4 IMPLANT
SUT VIC AB 1 CT1 18XBRD ANBCTR (SUTURE) ×2 IMPLANT
SUT VIC AB 1 CT1 8-18 (SUTURE) ×2
SUT VIC AB 2-0 CP2 18 (SUTURE) ×6 IMPLANT
SYR 20ML ECCENTRIC (SYRINGE) ×2 IMPLANT
SYR CONTROL 10ML LL (SYRINGE) ×2 IMPLANT
TAPE CLOTH SURG 4X10 WHT LF (GAUZE/BANDAGES/DRESSINGS) ×2 IMPLANT
TOWEL OR 17X24 6PK STRL BLUE (TOWEL DISPOSABLE) ×2 IMPLANT
TOWEL OR 17X26 10 PK STRL BLUE (TOWEL DISPOSABLE) ×2 IMPLANT
TRAY FOLEY CATH 14FRSI W/METER (CATHETERS) ×2 IMPLANT
WATER STERILE IRR 1000ML POUR (IV SOLUTION) ×2 IMPLANT

## 2012-06-09 NOTE — Anesthesia Procedure Notes (Signed)
Procedure Name: Intubation Date/Time: 06/09/2012 8:39 AM Performed by: Ellin Goodie Pre-anesthesia Checklist: Patient identified, Emergency Drugs available, Suction available, Patient being monitored and Timeout performed Patient Re-evaluated:Patient Re-evaluated prior to inductionOxygen Delivery Method: Circle system utilized Preoxygenation: Pre-oxygenation with 100% oxygen Intubation Type: IV induction Ventilation: Mask ventilation without difficulty Laryngoscope Size: Mac and 4 Grade View: Grade I Tube type: Oral Tube size: 7.5 mm Number of attempts: 1 Airway Equipment and Method: Stylet Placement Confirmation: ETT inserted through vocal cords under direct vision,  positive ETCO2 and breath sounds checked- equal and bilateral Secured at: 23 cm Tube secured with: Tape Dental Injury: Teeth and Oropharynx as per pre-operative assessment

## 2012-06-09 NOTE — Transfer of Care (Addendum)
Immediate Anesthesia Transfer of Care Note  Patient: Joshua Compton  Procedure(s) Performed: Procedure(s) (LRB): POSTERIOR LUMBAR FUSION 1 LEVEL (N/A)  Patient Location: PACU  Anesthesia Type: General  Level of Consciousness: awake  Airway & Oxygen Therapy: Patient Spontanous Breathing  Post-op Assessment: Report given to PACU RN  Post vital signs: Reviewed  Complications: No apparent anesthesia complications

## 2012-06-09 NOTE — Preoperative (Signed)
Beta Blockers   Reason not to administer Beta Blockers:Not Applicable 

## 2012-06-09 NOTE — Progress Notes (Signed)
Filed Vitals:   06/09/12 1345 06/09/12 1400 06/09/12 1430 06/09/12 1605  BP: 125/65 131/73 110/71 125/61  Pulse: 64 65 59 80  Temp:  97 F (36.1 C) 97.7 F (36.5 C) 97.4 F (36.3 C)  TempSrc:   Oral Oral  Resp: 12 19 18 20   SpO2: 96% 93% 96% 96%     Patient comfortable. Has already ambulated in the halls. Foley DC'd at 1530, no voided yet, nursing staff to monitor with bladder scan is in PVRs as needed. Moving all 4 short as well. Dressing clean and dry.   Plan: Doing well for the initial postoperative course, continue progress.  Hewitt Shorts, MD 06/09/2012, 7:41 PM

## 2012-06-09 NOTE — Anesthesia Preprocedure Evaluation (Addendum)
Anesthesia Evaluation  Patient identified by MRN, date of birth, ID band Patient awake    Reviewed: Allergy & Precautions, H&P , NPO status , Patient's Chart, lab work & pertinent test results, reviewed documented beta blocker date and time   Airway Mallampati: II TM Distance: <3 FB     Dental  (+) Teeth Intact and Dental Advisory Given   Pulmonary neg pulmonary ROS, former smoker,  breath sounds clear to auscultation        Cardiovascular hypertension, Pt. on medications Rhythm:Regular Rate:Normal     Neuro/Psych negative neurological ROS     GI/Hepatic Neg liver ROS, GERD-  Controlled,  Endo/Other  negative endocrine ROS  Renal/GU negative Renal ROS     Musculoskeletal   Abdominal (+)  Abdomen: soft. Bowel sounds: normal.  Peds  Hematology   Anesthesia Other Findings   Reproductive/Obstetrics                         Anesthesia Physical Anesthesia Plan  ASA: III  Anesthesia Plan:    Post-op Pain Management:    Induction: Intravenous  Airway Management Planned: Oral ETT  Additional Equipment:   Intra-op Plan:   Post-operative Plan: Extubation in OR  Informed Consent: I have reviewed the patients History and Physical, chart, labs and discussed the procedure including the risks, benefits and alternatives for the proposed anesthesia with the patient or authorized representative who has indicated his/her understanding and acceptance.     Plan Discussed with: CRNA, Anesthesiologist and Surgeon  Anesthesia Plan Comments:         Anesthesia Quick Evaluation

## 2012-06-09 NOTE — H&P (Signed)
Subjective: Patient is a 71 y.o. male who is admitted for treatment of multifactorial severe lumbar stenosis at the L4-5 level, above the previous L5-S1 arthrodesis. The patient presented with progressively worsening neurogenic claudication with pain in the buttocks, posterior thigh, and legs bilaterally. He has been bothersome with standing. Less so with walking, and is typically relieved by sitting or laying. CT shows a solid fusion at the previous L5-S1 level.   Patient Active Problem List   Diagnosis Date Noted  . Hypertension 02/04/2011  . Degenerative disc disease 02/04/2011  . Gastrointestinal bleed 02/04/2011  . Anemia 02/04/2011  . Prostate cancer 02/04/2011   Past Medical History  Diagnosis Date  . Arthritis   . Glaucoma   . Cancer     prostate ca   seed implants  . Hypertension     dr Modesto Charon     Caroleen Hamman    Past Surgical History  Procedure Date  . Back surgery     x2  . Neck surgery     Prescriptions prior to admission  Medication Sig Dispense Refill  . bimatoprost (LUMIGAN) 0.03 % ophthalmic solution Place 1 drop into both eyes at bedtime.      . brimonidine (ALPHAGAN) 0.2 % ophthalmic solution Place 1 drop into both eyes daily.      . Enalapril-Hydrochlorothiazide 5-12.5 MG per tablet Take 1 tablet by mouth daily.      . felodipine (PLENDIL) 5 MG 24 hr tablet Take 5 mg by mouth daily.        . pravastatin (PRAVACHOL) 10 MG tablet Take 10 mg by mouth at bedtime.      . timolol (BETIMOL) 0.5 % ophthalmic solution Place 1 drop into both eyes daily.       No Known Allergies  History  Substance Use Topics  . Smoking status: Former Smoker    Types: Cigarettes    Quit date: 06/04/2011  . Smokeless tobacco: Not on file  . Alcohol Use: No    History reviewed. No pertinent family history.   Review of Systems A comprehensive review of systems was negative.  Objective: Vital signs in last 24 hours: Temp:  [98 F (36.7 C)] 98 F (36.7 C) (07/24 0644) Pulse  Rate:  [70] 70  (07/24 0644) Resp:  [18] 18  (07/24 0644) BP: (121)/(76) 121/76 mmHg (07/24 0644) SpO2:  [95 %] 95 % (07/24 0644)  EXAM: Patient is a well-developed well-nourished black male in no acute distress. Lungs are clear to auscultation , the patient has symmetrical respiratory excursion. Heart has a regular rate and rhythm normal S1 and S2 no murmur.   Abdomen is soft nontender nondistended bowel sounds are present. Extremity examination shows no clubbing cyanosis or edema. Musculoskeletal examination shows noticed a patient of the lumbar spinous process her car musculature. He is able to flex to 90 and able to extend to 10. Straight leg raising is negative bilaterally. Motor examination shows 5 over 5 strength in the lower extremities including the iliopsoas quadriceps dorsiflexor extensor hallicus  longus and plantar flexor bilaterally. Sensation is intact to pinprick in the distal lower extremities. Reflexes are symmetrical bilaterally. No pathologic reflexes are present. Patient has a normal gait and stance.   Data Review:CBC    Component Value Date/Time   WBC 6.9 06/03/2012 1020   WBC 6.8 07/24/2010   RBC 4.65 06/03/2012 1020   HGB 14.6 06/03/2012 1020   HCT 42.4 06/03/2012 1020   PLT 258 06/03/2012 1020  MCV 91.2 06/03/2012 1020   MCH 31.4 06/03/2012 1020   MCHC 34.4 06/03/2012 1020   RDW 14.5 06/03/2012 1020   LYMPHSABS 2.9 01/09/2011 1251   MONOABS 0.8 01/09/2011 1251   EOSABS 0.2 01/09/2011 1251   BASOSABS 0.1 01/09/2011 1251                          BMET    Component Value Date/Time   NA 137 06/03/2012 1020   NA 139 07/24/2010   K 4.0 06/03/2012 1020   CL 101 06/03/2012 1020   CO2 26 06/03/2012 1020   GLUCOSE 82 06/03/2012 1020   BUN 20 06/03/2012 1020   BUN 18 07/24/2010   CREATININE 1.01 06/03/2012 1020   CREATININE 1.0 07/24/2010   CALCIUM 9.8 06/03/2012 1020   GFRNONAA 73* 06/03/2012 1020   GFRAA 84* 06/03/2012 1020     Assessment/Plan:  Patient with severe lumbar stenosis  at the L4-5 level. Patient had 4 decompression and stabilization at the L4-5 level. We'll plan an L4-5 lumbar decompression, posterior lumbar interbody arthrodesis, and posterior lateral arthrodesis. I've discussed with the patient the nature of his condition, the nature the surgical procedure, the typical length of surgery, hospital stay, and overall recuperation, the limitations postoperatively, and risks of surgery. I discussed risks including risks of infection, bleeding, possibly need for transfusion, the risk of nerve root dysfunction with pain, weakness, numbness, or paresthesias, the risk of dural tear and CSF leakage and possible need for further surgery, the risk of failure of the arthrodesis and possibly for further surgery, the risk of anesthetic complications including myocardial infarction, stroke, pneumonia, and death. We discussed the need for postoperative immobilization in a lumbar brace. Understanding all this the patient does wish to proceed with surgery and is admitted for such.     Hewitt Shorts, MD 06/09/2012 8:17 AM

## 2012-06-09 NOTE — Op Note (Signed)
06/09/2012  1:31 PM  PATIENT:  Joshua Compton  71 y.o. male  PRE-OPERATIVE DIAGNOSIS:  lumbar stenosis, lumbar spondylosis, lumbar degenerative disc disease, neurogenic claudication  POST-OPERATIVE DIAGNOSIS:  lumbar stenosis, lumbar spondylosis, lumbar degenerative disc disease, neurogenic claudication  PROCEDURE:  Procedure(s): POSTERIOR LUMBAR FUSION 1 LEVEL: Bilateral L4-5 lumbar decompression including laminectomy, facetectomy, and foraminotomy, with decompression of the exiting L4 and L5 nerve roots bilaterally, with decompression beyond that required for interbody arthrodesis, with microdissection, microsurgical technique, and the operating microscope, bilateral L4-5 posterior lumbar interbody arthrodesis with AVS peek interbody implants, Vitoss BA with bone marrow aspirate, and infuse, and bilateral L4-5 posterolateral arthrodesis with radius posterior instrumentation, Vitoss BA with bone marrow aspirate, and infuse, with removal of the previously placed screws at S1 bilaterally.  SURGEON:  Surgeon(s): Hewitt Shorts, MD Mariam Dollar, MD  ASSISTANTS: Donalee Citrin, M.D.  ANESTHESIA:   general  EBL:  Total I/O In: 3375 [I.V.:2700; Blood:175; IV Piggyback:500] Out: 1235 [Urine:735; Blood:500]  BLOOD ADMINISTERED:175 CC CELLSAVER  COUNT: Correct per nursing staff  DICTATION: Patient is brought to the operating room placed under general endotracheal anesthesia. The patient was turned to prone position the lumbar region was prepped with Betadine soap and solution and draped in a sterile fashion. The midline was infiltrated with local anesthesia with epinephrine. A midline incision was made extending the previous midline incision rostrally and was carried down through the subcutaneous tissue, bipolar cautery and electrocautery were used to maintain hemostasis. Dissection was carried down to the lumbar fascia. The fascia was incised bilaterally and the paraspinal muscles were dissected  with a spinous process and lamina in a subperiosteal fashion. Dissection was then carried out laterally and we identified the patient's previous hardware at the L5-S1 level. We dissected around the previous instrumentation and were able to remove the locking caps and rods, and then removed the screws at S1. The screw holes were packed with Gelfoam with thrombin. We identified the L4-5 interlaminar space and dissected laterally over the facet complex and the transverse processes of L4 and L5 were exposed and decorticated. Decompression via laminectomy was performed using the high-speed drill and Kerrison punches. Dissection was carried out laterally including facetectomy and foraminotomies with decompression of the stenotic compression of the L4 and L5 nerve roots. Once the decompression stenotic compression of the thecal sac and exiting nerve roots was completed we proceeded with the posterior lumbar interbody arthrodesis. The annulus was incised bilaterally and the disc space entered. A thorough discectomy was performed using pituitary rongeurs and curettes. Once the discectomy was completed we began to prepare the endplate surfaces removing the cartilaginous endplates surface with paddle curettes. While doing the decompression, dural incursion occurred. The operating microscope was draped, the edges of the dura were identified, and the incursion was closed with 2 figure-of-eight 6-0 Prolene sutures. We tested the closure against a Valsalva to 30 cm of water, and no CSF leakage was seen. At the conclusion of the procedure we laid a pledget of DuraGen over the dural closure. We then measured the height of the intervertebral disc space. There is a significant discrepancy in the intravertebral height between the left and right side consistent with his degenerative scoliosis. We selected a 12 x 25 x 4 mm AVS peek interbody implant for the left side and a 9 x 25 x 4 mm AVS peek in the right implant for the right  side.  The C-arm fluoroscope was then draped and brought in the field and we identified  the pedicle entry points bilaterally at the L4 level. Each of the 2 pedicles was probed, we aspirated bone marrow aspirate from the vertebral bodies, this was injected over a 10 cc and 5 cc strip of Vitoss BA. Then each of the pedicles was examined with the ball probe good bony surfaces were found and no bony cuts were found. Each of the pedicles was then tapped with a 5.25 mm tap, again examined with the ball probe good threading was found and no bony cuts were found. We then placed 5.75 by 50 millimeter screws bilaterally at each level.  We then packed the AVS peek interbody implants with Vitoss BA with bone marrow aspirateand infuse, and then placed the first implant on the right side, carefully retracting the thecal sac and nerve root medially. We then went back to the left side and packed the midline with additional Vitoss with bone marrow aspirate and infuse and then placed a second implant and on the left side again retracting the thecal sac and nerve root medially. Additional Vitoss BA with bone marrow aspirate and infuse was packed lateral to the implants.  We then packed the lateral gutter over the transverse processes and intertransverse space with Vitoss BA with bone marrow aspirate and infuse. We then selected a 45 mm rod for the left side and a 40 mm rod for the right side, they were placed within the screw heads and secured with locking caps once all 4 locking caps were placed final tightening was performed against a counter torque. We then selected a variable multi-angle cross connector which was secured to the rods and each side, and then the 2 center locking collars were tightened down.  The wound had been irrigated multiple times during the procedure with saline solution and bacitracin solution, good hemostasis was established with a combination of bipolar cautery and Gelfoam with thrombin. Once good  hemostasis was confirmed we proceeded with closure paraspinal muscles deep fascia and Scarpa's fascia were closed with interrupted undyed 1 Vicryl sutures the subcutaneous and subcuticular closed with interrupted inverted 2-0 undyed Vicryl sutures the skin edges were approximated with Dermabond.  Following surgery the patient was turned back to the supine position to be reversed and the anesthetic extubated and transferred to the recovery room for further care.   PLAN OF CARE: Admit to inpatient   PATIENT DISPOSITION:  PACU - hemodynamically stable.   Delay start of Pharmacological VTE agent (>24hrs) due to surgical blood loss or risk of bleeding:  yes

## 2012-06-09 NOTE — Anesthesia Postprocedure Evaluation (Signed)
  Anesthesia Post-op Note  Patient: Joshua Compton  Procedure(s) Performed: Procedure(s) (LRB): POSTERIOR LUMBAR FUSION 1 LEVEL (N/A)  Patient Location: PACU  Anesthesia Type: General  Level of Consciousness: awake  Airway and Oxygen Therapy: Patient Spontanous Breathing  Post-op Pain: mild  Post-op Assessment: Post-op Vital signs reviewed  Post-op Vital Signs: Reviewed  Complications: No apparent anesthesia complications

## 2012-06-10 MED ORDER — OXYCODONE-ACETAMINOPHEN 5-325 MG PO TABS: 1.0000 | ORAL_TABLET | ORAL | Status: AC | PRN

## 2012-06-10 MED ORDER — HYDROCHLOROTHIAZIDE 12.5 MG PO CAPS
12.5000 mg | ORAL_CAPSULE | Freq: Every day | ORAL | Status: DC
  Filled 2012-06-10: qty 1

## 2012-06-10 MED FILL — Heparin Sodium (Porcine) Inj 1000 Unit/ML: INTRAMUSCULAR | Qty: 30 | Status: AC

## 2012-06-10 MED FILL — Sodium Chloride IV Soln 0.9%: INTRAVENOUS | Qty: 2000 | Status: AC

## 2012-06-10 NOTE — Progress Notes (Signed)
Pt and wife given D/C instructions with Rx, Pt verbalized understanding. Pt D/C'd home via wheelchair @ 1100 per MD order. Rema Fendt, RN

## 2012-06-10 NOTE — Discharge Summary (Signed)
Physician Discharge Summary  Patient ID: Joshua Compton MRN: [id number] DOB/AGE: January 17, 1941 71 y.o.  Admit date: 06/09/2012 Discharge date: 06/10/2012  Admission Diagnoses:  Lumbar stenosis, lumbar spondylosis, lumbar degenerative disc disease, neurogenic claudication  Discharge Diagnoses:  Lumbar stenosis, lumbar spondylosis, lumbar degenerative disc disease, neurogenic claudication  Discharged Condition: good  Hospital Course: Patient was admitted underwent a bilateral L4-5 lumbar decompression and PLIF and PLA. He has done well following surgery and he's been up and ambulating actively, he is not use any pain medication other than for IV acetaminophen and Toradol. He is voiding well. He is asking to be discharged to home. He has been given instructions regarding wound care and activities. He is to return for followup with me in 3 weeks. Dressing was removed and the wound is clean and dry.  Discharge Exam: Blood pressure 106/66, pulse 58, temperature 98.6 F (37 C), temperature source Oral, resp. rate 16, SpO2 92.00%.  Disposition: Home   Medication List  As of 06/10/2012 10:17 AM   TAKE these medications         bimatoprost 0.03 % ophthalmic solution   Commonly known as: LUMIGAN   Place 1 drop into both eyes at bedtime.      brimonidine 0.2 % ophthalmic solution   Commonly known as: ALPHAGAN   Place 1 drop into both eyes daily.      Enalapril-Hydrochlorothiazide 5-12.5 MG per tablet   Take 1 tablet by mouth daily.      felodipine 5 MG 24 hr tablet   Commonly known as: PLENDIL   Take 5 mg by mouth daily.      oxyCODONE-acetaminophen 5-325 MG per tablet   Commonly known as: PERCOCET/ROXICET   Take 1-2 tablets by mouth every 4 (four) hours as needed for pain.      pravastatin 10 MG tablet   Commonly known as: PRAVACHOL   Take 10 mg by mouth at bedtime.      timolol 0.5 % ophthalmic solution   Commonly known as: BETIMOL   Place 1 drop into both eyes daily.             Signed: Hewitt Shorts, MD 06/10/2012, 10:17 AM

## 2012-06-10 NOTE — Progress Notes (Signed)
UR COMPLETED  

## 2013-02-09 ENCOUNTER — Ambulatory Visit (INDEPENDENT_AMBULATORY_CARE_PROVIDER_SITE_OTHER): Payer: Medicare Other | Admitting: Physician Assistant

## 2013-02-09 VITALS — BP 162/80 | HR 66 | Temp 97.2°F | Ht 72.0 in | Wt 206.0 lb

## 2013-02-09 DIAGNOSIS — N23 Unspecified renal colic: Secondary | ICD-10-CM

## 2013-02-09 DIAGNOSIS — R109 Unspecified abdominal pain: Secondary | ICD-10-CM

## 2013-02-09 DIAGNOSIS — E1059 Type 1 diabetes mellitus with other circulatory complications: Secondary | ICD-10-CM

## 2013-02-09 LAB — POCT URINALYSIS DIPSTICK
Glucose, UA: NEGATIVE
Ketones, UA: NEGATIVE
Spec Grav, UA: >=1.03

## 2013-02-09 LAB — POCT UA - MICROALBUMIN: Microalbumin Ur, POC: 50 mg/dL

## 2013-02-09 MED ORDER — TAMSULOSIN HCL 0.4 MG PO CAPS: 0.4000 mg | ORAL_CAPSULE | Freq: Two times a day (BID) | ORAL | Status: DC | PRN

## 2013-02-09 MED ORDER — HYDROCODONE-ACETAMINOPHEN 5-300 MG PO TABS: ORAL_TABLET | ORAL | Status: DC

## 2013-02-09 NOTE — Progress Notes (Signed)
  Subjective:    Patient ID: Joshua Compton, male    DOB: Mar 22, 1941, 72 y.o.   MRN: [id number]  HPI    Review of Systems  Genitourinary:       Left flank pain started 1 day ago; difficulty sleeping  Musculoskeletal:       Pain in left groin for several weeks  All other systems reviewed and are negative.       Objective:   Physical Exam  Vitals reviewed. Constitutional: He appears well-developed and well-nourished.  HENT:  Head: Normocephalic and atraumatic.  Right Ear: External ear normal.  Left Ear: External ear normal.  Nose: Nose normal.  Mouth/Throat: Oropharynx is clear and moist.  Eyes: Conjunctivae and EOM are normal. Pupils are equal, round, and reactive to light.  Cardiovascular: Normal rate, regular rhythm and normal heart sounds.   No murmur heard. Pulmonary/Chest: Effort normal and breath sounds normal.  Abdominal: Soft. Bowel sounds are normal.  Genitourinary:  Hematuria Left CVA tenderness  Musculoskeletal:  Left groin ache  Skin: Skin is warm and dry.          Assessment & Plan:  Flank pain - Plan: POCT UA - Microalbumin, POCT urinalysis dipstick, tamsulosin (FLOMAX) 0.4 MG CAPS, Hydrocodone-Acetaminophen 5-300 MG TABS  Renal colic - Plan: tamsulosin (FLOMAX) 0.4 MG CAPS, Hydrocodone-Acetaminophen 5-300 MG TABS  Type I (juvenile type) diabetes mellitus with peripheral circulatory disorders, not stated as uncontrolled(250.71) - Plan: Microalbumin, urine

## 2013-02-10 LAB — MICROALBUMIN, URINE: Microalb, Ur: 3.14 mg/dL — ABNORMAL HIGH (ref 0.00–1.89)

## 2013-02-11 ENCOUNTER — Encounter: Payer: Self-pay | Admitting: *Deleted

## 2013-02-15 ENCOUNTER — Other Ambulatory Visit (INDEPENDENT_AMBULATORY_CARE_PROVIDER_SITE_OTHER): Payer: Medicare Other

## 2013-02-15 DIAGNOSIS — C61 Malignant neoplasm of prostate: Secondary | ICD-10-CM

## 2013-02-15 NOTE — Progress Notes (Signed)
Patient came in for labs ordered by dr.wrenn but seen here by dr.wong

## 2013-02-16 LAB — PSA: PSA: 0.37 ng/mL (ref <=4.00)

## 2013-02-16 NOTE — Progress Notes (Signed)
Quick Note:  Call patient. Labs normal. No change in plan. Copy of PSA to his Urologist please. ______

## 2013-02-28 ENCOUNTER — Ambulatory Visit: Payer: Self-pay | Admitting: Family Medicine

## 2013-04-29 ENCOUNTER — Emergency Department (HOSPITAL_COMMUNITY): Payer: Medicare Other

## 2013-04-29 ENCOUNTER — Emergency Department (HOSPITAL_COMMUNITY)
Admission: EM | Admit: 2013-04-29 | Discharge: 2013-04-29 | Disposition: A | Discharge status: Home / Self Care | Payer: Medicare Other | Attending: Emergency Medicine | Admitting: Emergency Medicine

## 2013-04-29 ENCOUNTER — Encounter (HOSPITAL_COMMUNITY): Payer: Self-pay | Admitting: Emergency Medicine

## 2013-04-29 DIAGNOSIS — Z87891 Personal history of nicotine dependence: Secondary | ICD-10-CM | POA: Insufficient documentation

## 2013-04-29 DIAGNOSIS — R109 Unspecified abdominal pain: Secondary | ICD-10-CM

## 2013-04-29 DIAGNOSIS — M129 Arthropathy, unspecified: Secondary | ICD-10-CM | POA: Insufficient documentation

## 2013-04-29 DIAGNOSIS — Z8546 Personal history of malignant neoplasm of prostate: Secondary | ICD-10-CM | POA: Insufficient documentation

## 2013-04-29 DIAGNOSIS — Z79899 Other long term (current) drug therapy: Secondary | ICD-10-CM | POA: Insufficient documentation

## 2013-04-29 DIAGNOSIS — R1012 Left upper quadrant pain: Secondary | ICD-10-CM | POA: Insufficient documentation

## 2013-04-29 DIAGNOSIS — R52 Pain, unspecified: Secondary | ICD-10-CM | POA: Insufficient documentation

## 2013-04-29 DIAGNOSIS — H409 Unspecified glaucoma: Secondary | ICD-10-CM | POA: Insufficient documentation

## 2013-04-29 DIAGNOSIS — I10 Essential (primary) hypertension: Secondary | ICD-10-CM | POA: Insufficient documentation

## 2013-04-29 LAB — COMPREHENSIVE METABOLIC PANEL
AST: 19 U/L (ref 0–37)
Albumin: 3.6 g/dL (ref 3.5–5.2)
Alkaline Phosphatase: 98 U/L (ref 39–117)
Chloride: 106 mEq/L (ref 96–112)
Potassium: 3.6 mEq/L (ref 3.5–5.1)
Total Bilirubin: 0.4 mg/dL (ref 0.3–1.2)

## 2013-04-29 LAB — CBC WITH DIFFERENTIAL/PLATELET
Basophils Relative: 1 % (ref 0–1)
Eosinophils Absolute: 0.3 10*3/uL (ref 0.0–0.7)
HCT: 36.1 % — ABNORMAL LOW (ref 39.0–52.0)
Hemoglobin: 12.4 g/dL — ABNORMAL LOW (ref 13.0–17.0)
MCH: 30.8 pg (ref 26.0–34.0)
MCHC: 34.3 g/dL (ref 30.0–36.0)
Monocytes Absolute: 0.5 10*3/uL (ref 0.1–1.0)
Monocytes Relative: 8 % (ref 3–12)
Neutro Abs: 3.2 10*3/uL (ref 1.7–7.7)

## 2013-04-29 LAB — URINALYSIS, ROUTINE W REFLEX MICROSCOPIC
Bilirubin Urine: NEGATIVE
Glucose, UA: NEGATIVE mg/dL
Ketones, ur: NEGATIVE mg/dL
pH: 6 (ref 5.0–8.0)

## 2013-04-29 MED ORDER — ONDANSETRON HCL 4 MG/2ML IJ SOLN
4.0000 mg | Freq: Once | INTRAMUSCULAR | Status: DC | PRN
  Filled 2013-04-29: qty 2

## 2013-04-29 MED ORDER — IOHEXOL 300 MG/ML  SOLN
100.0000 mL | Freq: Once | INTRAMUSCULAR | Status: AC | PRN
  Administered 2013-04-29: 100 mL via INTRAVENOUS

## 2013-04-29 MED ORDER — HYDROMORPHONE HCL PF 1 MG/ML IJ SOLN
0.5000 mg | INTRAMUSCULAR | Status: DC | PRN
  Filled 2013-04-29: qty 1

## 2013-04-29 MED ORDER — IOHEXOL 300 MG/ML  SOLN
25.0000 mL | Freq: Once | INTRAMUSCULAR | Status: AC | PRN
  Administered 2013-04-29: 25 mL via ORAL

## 2013-04-29 MED ORDER — SODIUM CHLORIDE 0.9 % IV SOLN
1000.0000 mL | INTRAVENOUS | Status: DC
  Administered 2013-04-29: 1000 mL via INTRAVENOUS

## 2013-04-29 MED ORDER — SODIUM CHLORIDE 0.9 % IV SOLN
1000.0000 mL | Freq: Once | INTRAVENOUS | Status: AC
  Administered 2013-04-29: 1000 mL via INTRAVENOUS

## 2013-04-29 NOTE — ED Provider Notes (Signed)
History    CSN: [id number] Arrival date & time 04/29/13  0356  First MD Initiated Contact with Patient 04/29/13 0408      Chief Complaint  Patient presents with  . Abdominal Pain    Patient is a 72 y.o. male presenting with abdominal pain. The history is provided by the patient.  Abdominal Pain This is a new problem. The current episode started 1 to 2 hours ago. The problem occurs constantly. The problem has not changed since onset.Associated symptoms include abdominal pain. Pertinent negatives include no chest pain and no shortness of breath. Nothing aggravates the symptoms. Nothing relieves the symptoms. He has tried nothing for the symptoms.   Pt went to bed last night and felt fine.  He woke up this morning with acute pain that woke him up.No prior history of same.  THe pain is in the LUQ.  The pain seems to be getting better at this point.  He does have history of kidney stones but this does not seem similar.  Past Medical History  Diagnosis Date  . Arthritis   . Glaucoma   . Cancer     prostate ca   seed implants  . Hypertension     dr Modesto Charon     Caroleen Hamman    Past Surgical History  Procedure Laterality Date  . Back surgery      x2  . Neck surgery      Family History  Problem Relation Age of Onset  . Cancer Mother     breast  . Heart disease Father     History  Substance Use Topics  . Smoking status: Former Smoker    Types: Cigarettes    Quit date: 06/04/2011  . Smokeless tobacco: Not on file  . Alcohol Use: No      Review of Systems  Respiratory: Negative for shortness of breath.   Cardiovascular: Negative for chest pain.  Gastrointestinal: Positive for abdominal pain.  All other systems reviewed and are negative.    Allergies  Review of patient's allergies indicates no known allergies.  Home Medications   Current Outpatient Rx  Name  Route  Sig  Dispense  Refill  . bimatoprost (LUMIGAN) 0.03 % ophthalmic solution   Both Eyes   Place 1 drop  into both eyes at bedtime.         . brimonidine (ALPHAGAN) 0.2 % ophthalmic solution   Both Eyes   Place 1 drop into both eyes daily.         . Enalapril-Hydrochlorothiazide 5-12.5 MG per tablet   Oral   Take 1 tablet by mouth daily.         . felodipine (PLENDIL) 5 MG 24 hr tablet   Oral   Take 5 mg by mouth daily.           Marland Kitchen ibuprofen (ADVIL,MOTRIN) 200 MG tablet   Oral   Take 200 mg by mouth every 6 (six) hours as needed for pain.         Marland Kitchen timolol (BETIMOL) 0.5 % ophthalmic solution   Both Eyes   Place 1 drop into both eyes daily.           BP 159/85  Pulse 54  Temp(Src) 97.5 F (36.4 C) (Oral)  Resp 19  SpO2 94%  Physical Exam  Nursing note and vitals reviewed. Constitutional: He appears well-developed and well-nourished. No distress.  HENT:  Head: Normocephalic and atraumatic.  Right Ear: External ear normal.  Left Ear: External ear normal.  Eyes: Conjunctivae are normal. Right eye exhibits no discharge. Left eye exhibits no discharge. No scleral icterus.  Neck: Neck supple. No tracheal deviation present.  Cardiovascular: Normal rate, regular rhythm and intact distal pulses.   Pulmonary/Chest: Effort normal and breath sounds normal. No stridor. No respiratory distress. He has no wheezes. He has no rales.  Abdominal: Soft. Bowel sounds are normal. He exhibits no distension. There is no tenderness. There is no rebound and no guarding.  Musculoskeletal: He exhibits no edema and no tenderness.  Neurological: He is alert. He has normal strength. No sensory deficit. Cranial nerve deficit:  no gross defecits noted. He exhibits normal muscle tone. He displays no seizure activity. Coordination normal.  Skin: Skin is warm and dry. No rash noted.  Psychiatric: He has a normal mood and affect.    ED Course  Procedures (including critical care time)  Labs Reviewed  COMPREHENSIVE METABOLIC PANEL - Abnormal; Notable for the following:    GFR calc non Af Amer  71 (*)    GFR calc Af Amer 82 (*)    All other components within normal limits  CBC WITH DIFFERENTIAL - Abnormal; Notable for the following:    RBC 4.02 (*)    Hemoglobin 12.4 (*)    HCT 36.1 (*)    All other components within normal limits  LIPASE, BLOOD  URINALYSIS, ROUTINE W REFLEX MICROSCOPIC   No results found.   MDM  Pt without obvious etiology for his pain.  Considering his age and the severity of the symptoms.  Will ct to evaluate further.        Celene Kras, MD 04/29/13 [phone]

## 2013-04-29 NOTE — ED Notes (Addendum)
Per Pt: Pt states he started having abdominal pain in the L quadrant of his abdomen. Pt states the pain started at 0315 and came on all of the sudden. Pt's abdomen is tender to palpation. States pain was initially 10/10 but is now a 5/10. Pt had some associated nausea. Pt is alertx4, NAD.

## 2013-04-29 NOTE — ED Notes (Signed)
Patient transported to CT 

## 2013-04-29 NOTE — ED Notes (Signed)
Family at bedside. 

## 2013-04-29 NOTE — ED Notes (Signed)
Pt given a urinal and aware urine sample is needed. Unable to void at this time.

## 2013-04-29 NOTE — ED Provider Notes (Signed)
BP 157/74  Pulse 58  Temp(Src) 97.5 F (36.4 C) (Oral)  Resp 20  SpO2 94% Pt improved He has no further abd pain and no focal tenderness Discussed CT findings Discussed strict return precautions Stable for d/c Pt denies active CP/SOB  Joya Gaskins, MD 04/29/13 0900

## 2013-04-29 NOTE — ED Notes (Signed)
Reminded pt we needed a urine sample, still unable to go.

## 2013-04-29 NOTE — ED Notes (Signed)
Returned from ct scan 

## 2013-05-13 ENCOUNTER — Telehealth: Payer: Self-pay | Admitting: Family Medicine

## 2013-05-16 MED ORDER — ENALAPRIL-HYDROCHLOROTHIAZIDE 5-12.5 MG PO TABS: 1.0000 | ORAL_TABLET | Freq: Every day | ORAL | Status: DC

## 2013-05-16 MED ORDER — FELODIPINE ER 5 MG PO TB24: 5.0000 mg | ORAL_TABLET | Freq: Every day | ORAL | Status: DC

## 2013-05-16 NOTE — Telephone Encounter (Signed)
done

## 2013-06-17 ENCOUNTER — Ambulatory Visit: Payer: Medicare Other | Admitting: Family Medicine

## 2013-07-08 ENCOUNTER — Ambulatory Visit: Payer: Self-pay | Admitting: Family Medicine

## 2013-07-14 ENCOUNTER — Encounter (INDEPENDENT_AMBULATORY_CARE_PROVIDER_SITE_OTHER): Payer: Medicare Other

## 2013-07-25 ENCOUNTER — Encounter: Payer: Self-pay | Admitting: Family Medicine

## 2013-07-25 ENCOUNTER — Ambulatory Visit (INDEPENDENT_AMBULATORY_CARE_PROVIDER_SITE_OTHER): Payer: Medicare Other | Admitting: Family Medicine

## 2013-07-25 VITALS — BP 136/79 | HR 62 | Temp 97.7°F | Ht 72.0 in | Wt 203.6 lb

## 2013-07-25 DIAGNOSIS — M1990 Unspecified osteoarthritis, unspecified site: Secondary | ICD-10-CM | POA: Insufficient documentation

## 2013-07-25 DIAGNOSIS — E785 Hyperlipidemia, unspecified: Secondary | ICD-10-CM | POA: Insufficient documentation

## 2013-07-25 DIAGNOSIS — I10 Essential (primary) hypertension: Secondary | ICD-10-CM

## 2013-07-25 DIAGNOSIS — M129 Arthropathy, unspecified: Secondary | ICD-10-CM

## 2013-07-25 DIAGNOSIS — C61 Malignant neoplasm of prostate: Secondary | ICD-10-CM

## 2013-07-25 DIAGNOSIS — IMO0002 Reserved for concepts with insufficient information to code with codable children: Secondary | ICD-10-CM

## 2013-07-25 DIAGNOSIS — K922 Gastrointestinal hemorrhage, unspecified: Secondary | ICD-10-CM

## 2013-07-25 DIAGNOSIS — C801 Malignant (primary) neoplasm, unspecified: Secondary | ICD-10-CM | POA: Insufficient documentation

## 2013-07-25 DIAGNOSIS — D649 Anemia, unspecified: Secondary | ICD-10-CM

## 2013-07-25 LAB — POCT CBC
Granulocyte percent: 53.1 %G (ref 37–80)
HCT, POC: 45.2 % (ref 43.5–53.7)
Hemoglobin: 14.9 g/dL (ref 14.1–18.1)
Lymph, poc: 2.2 (ref 0.6–3.4)
MCH, POC: 30.3 pg (ref 27–31.2)
MCHC: 33.1 g/dL (ref 31.8–35.4)
MCV: 91.7 fL (ref 80–97)
MPV: 8.2 fL (ref 0–99.8)
POC Granulocyte: 2.9 (ref 2–6.9)
POC LYMPH PERCENT: 40.3 %L (ref 10–50)
Platelet Count, POC: 228 10*3/uL (ref 142–424)
RBC: 4.9 M/uL (ref 4.69–6.13)
RDW, POC: 14.9 %
WBC: 5.4 10*3/uL (ref 4.6–10.2)

## 2013-07-25 MED ORDER — FELODIPINE ER 5 MG PO TB24: 5.0000 mg | ORAL_TABLET | Freq: Every day | ORAL | Status: DC

## 2013-07-25 MED ORDER — ENALAPRIL-HYDROCHLOROTHIAZIDE 5-12.5 MG PO TABS: 1.0000 | ORAL_TABLET | Freq: Every day | ORAL | Status: DC

## 2013-07-25 NOTE — Progress Notes (Signed)
Patient ID: Joshua Compton, male   DOB: [birth year], 72 y.o.   MRN: [id number] SUBJECTIVE: CC: Chief Complaint  Patient presents with  . Follow-up    refills / bp ck no complaints per pt    HPI:  Patient is here for follow up of hypertension: denies Headache;deniesChest Pain;denies weakness;denies Shortness of Breath or Orthopnea;denies Visual changes;denies palpitations;denies cough;denies pedal edema;denies symptoms of TIA or stroke; admits to Compliance with medications. denies Problems with medications.  Prostate cancer,; he needs a PSA  Done and sent to Dr Annabell Howells.  Hyperlipidemia: asymptomatic.  Former smoker: not smoking.   Past Medical History  Diagnosis Date  . Arthritis   . Glaucoma   . Cancer     prostate ca   seed implants  . Hypertension     dr Mod[place] Charon     Caroleen Hamman   Past Surgical History  Procedure Laterality Date  . Back surgery      x2  . Neck surgery    . Transurethral resection of prostate    . Prostate surgery      prsatated cancer  . Hernia repair  11-06-2009    left inguinal repair    History   Social History  . Marital Status: Married    Spouse Name: N/A    Number of Children: N/A  . Years of Education: N/A   Occupational History  . Not on file.   Social History Main Topics  . Smoking status: Former Smoker    Types: Cigarettes    Quit date: 06/04/2011  . Smokeless tobacco: Not on file  . Alcohol Use: No  . Drug Use: No  . Sexual Activity: Not on file   Other Topics Concern  . Not on file   Social History Narrative  . No narrative on file   Family History  Problem Relation Age of Onset  . Cancer Mother     breast  . Heart disease Father    Current Outpatient Prescriptions on File Prior to Visit  Medication Sig Dispense Refill  . bimatoprost (LUMIGAN) 0.03 % ophthalmic solution Place 1 drop into both eyes at bedtime.      . brimonidine (ALPHAGAN) 0.2 % ophthalmic solution Place 1 drop into both eyes daily.      .  Enalapril-Hydrochlorothiazide 5-12.5 MG per tablet Take 1 tablet by mouth daily.  30 tablet  2  . felodipine (PLENDIL) 5 MG 24 hr tablet Take 1 tablet (5 mg total) by mouth daily.  30 tablet  2  . ibuprofen (ADVIL,MOTRIN) 200 MG tablet Take 200 mg by mouth every 6 (six) hours as needed for pain.      Marland Kitchen timolol (BETIMOL) 0.5 % ophthalmic solution Place 1 drop into both eyes daily.       No current facility-administered medications on file prior to visit.   No Known Allergies  There is no immunization history on file for this patient. Prior to Admission medications   Medication Sig Start Date End Date Taking? Authorizing Provider  bimatoprost (LUMIGAN) 0.03 % ophthalmic solution Place 1 drop into both eyes at bedtime.   Yes Historical Provider, MD  brimonidine (ALPHAGAN) 0.2 % ophthalmic solution Place 1 drop into both eyes daily.   Yes Historical Provider, MD  Enalapril-Hydrochlorothiazide 5-12.5 MG per tablet Take 1 tablet by mouth daily. 05/16/13  Yes Ernestina Penna, MD  felodipine (PLENDIL) 5 MG 24 hr tablet Take 1 tablet (5 mg total) by mouth daily. 05/16/13  Yes Suella Grove  Christell Constant, MD  ibuprofen (ADVIL,MOTRIN) 200 MG tablet Take 200 mg by mouth every 6 (six) hours as needed for pain.   Yes Historical Provider, MD  timolol (BETIMOL) 0.5 % ophthalmic solution Place 1 drop into both eyes daily.   Yes Historical Provider, MD     ROS: As above in the HPI. All other systems are stable or negative.  OBJECTIVE: APPEARANCE:  Patient in no acute distress.The patient appeared well nourished and normally developed. Acyanotic. Waist: VITAL SIGNS:BP 136/79  Pulse 62  Temp(Src) 97.7 F (36.5 C) (Oral)  Ht 6' (1.829 m)  Wt 203 lb 9.6 oz (92.352 kg)  BMI 27.61 kg/m2 AAM   SKIN: warm and  Dry without overt rashes, tattoos and scars  HEAD and Neck: without JVD, Head and scalp: normal Eyes:No scleral icterus. Fundi normal, eye movements normal. Ears: Auricle normal, canal normal, Tympanic  membranes normal, insufflation normal. Nose: normal Throat: normal Neck & thyroid: normal  CHEST & LUNGS: Chest wall: normal Lungs: Clear  CVS: Reveals the PMI to be normally located. Regular rhythm, First and Second Heart sounds are normal,  absence of murmurs, rubs or gallops. Peripheral vasculature: Radial pulses: normal Dorsal pedis pulses: normal Posterior pulses: normal  ABDOMEN:  Appearance: normal Benign, no organomegaly, no masses, no Abdominal Aortic enlargement. No Guarding , no rebound. No Bruits. Bowel sounds: normal  RECTAL: N/A GU: N/A  EXTREMETIES: nonedematous.  MUSCULOSKELETAL:  Spine: normal Joints: intact  NEUROLOGIC: oriented to time,place and person; nonfocal. Strength is normal Sensory is normal Reflexes are normal Cranial Nerves are normal.   Results for orders placed during the hospital encounter of 04/29/13  COMPREHENSIVE METABOLIC PANEL      Result Value Range   Sodium 140  135 - 145 mEq/L   Potassium 3.6  3.5 - 5.1 mEq/L   Chloride 106  96 - 112 mEq/L   CO2 25  19 - 32 mEq/L   Glucose, Bld 95  70 - 99 mg/dL   BUN 20  6 - 23 mg/dL   Creatinine, Ser 0.98  0.50 - 1.35 mg/dL   Calcium 9.1  8.4 - 11.9 mg/dL   Total Protein 6.8  6.0 - 8.3 g/dL   Albumin 3.6  3.5 - 5.2 g/dL   AST 19  0 - 37 U/L   ALT 17  0 - 53 U/L   Alkaline Phosphatase 98  39 - 117 U/L   Total Bilirubin 0.4  0.3 - 1.2 mg/dL   GFR calc non Af Amer 71 (*) >90 mL/min   GFR calc Af Amer 82 (*) >90 mL/min  LIPASE, BLOOD      Result Value Range   Lipase 20  11 - 59 U/L  URINALYSIS, ROUTINE W REFLEX MICROSCOPIC      Result Value Range   Color, Urine YELLOW  YELLOW   APPearance CLEAR  CLEAR   Specific Gravity, Urine 1.016  1.005 - 1.030   pH 6.0  5.0 - 8.0   Glucose, UA NEGATIVE  NEGATIVE mg/dL   Hgb urine dipstick SMALL (*) NEGATIVE   Bilirubin Urine NEGATIVE  NEGATIVE   Ketones, ur NEGATIVE  NEGATIVE mg/dL   Protein, ur NEGATIVE  NEGATIVE mg/dL   Urobilinogen,  UA 0.2  0.0 - 1.0 mg/dL   Nitrite NEGATIVE  NEGATIVE   Leukocytes, UA NEGATIVE  NEGATIVE  CBC WITH DIFFERENTIAL      Result Value Range   WBC 5.7  4.0 - 10.5 K/uL   RBC 4.02 (*) 4.22 -  5.81 MIL/uL   Hemoglobin 12.4 (*) 13.0 - 17.0 g/dL   HCT 91.4 (*) 78.2 - 95.6 %   MCV 89.8  78.0 - 100.0 fL   MCH 30.8  26.0 - 34.0 pg   MCHC 34.3  30.0 - 36.0 g/dL   RDW 21.3  08.6 - 57.8 %   Platelets 219  150 - 400 K/uL   Neutrophils Relative % 56  43 - 77 %   Neutro Abs 3.2  1.7 - 7.7 K/uL   Lymphocytes Relative 31  12 - 46 %   Lymphs Abs 1.8  0.7 - 4.0 K/uL   Monocytes Relative 8  3 - 12 %   Monocytes Absolute 0.5  0.1 - 1.0 K/uL   Eosinophils Relative 4  0 - 5 %   Eosinophils Absolute 0.3  0.0 - 0.7 K/uL   Basophils Relative 1  0 - 1 %   Basophils Absolute 0.1  0.0 - 0.1 K/uL  URINE MICROSCOPIC-ADD ON      Result Value Range   Squamous Epithelial / LPF RARE  RARE   RBC / HPF 0-2  <3 RBC/hpf   Bacteria, UA RARE  RARE    ASSESSMENT: Hypertension - Plan: Enalapril-Hydrochlorothiazide 5-12.5 MG per tablet, felodipine (PLENDIL) 5 MG 24 hr tablet, CMP14+EGFR  Anemia - Plan: POCT CBC  Arthritis  Degenerative disc disease  Gastrointestinal bleed  Hyperlipidemia - Plan: CMP14+EGFR, NMR, lipoprofile  Prostate cancer - Plan: PSA, total and free   PLAN:  Orders Placed This Encounter  Procedures  . CMP14+EGFR  . NMR, lipoprofile  . PSA, total and free  . POCT CBC    Meds ordered this encounter  Medications  . Enalapril-Hydrochlorothiazide 5-12.5 MG per tablet    Sig: Take 1 tablet by mouth daily.    Dispense:  30 tablet    Refill:  5  . felodipine (PLENDIL) 5 MG 24 hr tablet    Sig: Take 1 tablet (5 mg total) by mouth daily.    Dispense:  30 tablet    Refill:  5        Dr Woodroe Mode Recommendations  For nutrition information, I recommend books:  1).Eat to Live by Dr Monico Hoar. 2).Prevent and Reverse Heart Disease by Dr Suzzette Righter. 3) Dr Katherina Right  Book:  Program to Reverse Diabetes  Exercise recommendations are:  If unable to walk, then the patient can exercise in a chair 3 times a day. By flapping arms like a bird gently and raising legs outwards to the front.  If ambulatory, the patient can go for walks for 30 minutes 3 times a week. Then increase the intensity and duration as tolerated.  Goal is to try to attain exercise frequency to 5 times a week.  If applicable: Best to perform resistance exercises (machines or weights) 2 days a week and cardio type exercises 3 days per week. Marland Kitchen DASH DIET handout in the AVS.  Return in about 4 months (around 11/24/2013) for Recheck medical problems.  Rechelle Niebla P. [place] Charon, M.D.

## 2013-07-25 NOTE — Patient Instructions (Signed)
    Dr Bellamarie Pflug's Recommendations  For nutrition information, I recommend books:  1).Eat to Live by Dr Joel Fuhrman. 2).Prevent and Reverse Heart Disease by Dr Caldwell Esselstyn. 3) Dr Neal Barnard's Book:  Program to Reverse Diabetes  Exercise recommendations are:  If unable to walk, then the patient can exercise in a chair 3 times a day. By flapping arms like a bird gently and raising legs outwards to the front.  If ambulatory, the patient can go for walks for 30 minutes 3 times a week. Then increase the intensity and duration as tolerated.  Goal is to try to attain exercise frequency to 5 times a week.  If applicable: Best to perform resistance exercises (machines or weights) 2 days a week and cardio type exercises 3 days per week.  DASH Diet The DASH diet stands for "Dietary Approaches to Stop Hypertension." It is a healthy eating plan that has been shown to reduce high blood pressure (hypertension) in as little as 14 days, while also possibly providing other significant health benefits. These other health benefits include reducing the risk of breast cancer after menopause and reducing the risk of type 2 diabetes, heart disease, colon cancer, and stroke. Health benefits also include weight loss and slowing kidney failure in patients with chronic kidney disease.  DIET GUIDELINES  Limit salt (sodium). Your diet should contain less than 1500 mg of sodium daily.  Limit refined or processed carbohydrates. Your diet should include mostly whole grains. Desserts and added sugars should be used sparingly.  Include small amounts of heart-healthy fats. These types of fats include nuts, oils, and tub margarine. Limit saturated and trans fats. These fats have been shown to be harmful in the body. CHOOSING FOODS  The following food groups are based on a 2000 calorie diet. See your Registered Dietitian for individual calorie needs. Grains and Grain Products (6 to 8 servings daily)  Eat More  Often: Whole-wheat bread, brown rice, whole-grain or wheat pasta, quinoa, popcorn without added fat or salt (air popped).  Eat Less Often: White bread, white pasta, white rice, cornbread. Vegetables (4 to 5 servings daily)  Eat More Often: Fresh, frozen, and canned vegetables. Vegetables may be raw, steamed, roasted, or grilled with a minimal amount of fat.  Eat Less Often/Avoid: Creamed or fried vegetables. Vegetables in a cheese sauce. Fruit (4 to 5 servings daily)  Eat More Often: All fresh, canned (in natural juice), or frozen fruits. Dried fruits without added sugar. One hundred percent fruit juice ( cup [237 mL] daily).  Eat Less Often: Dried fruits with added sugar. Canned fruit in light or heavy syrup. Lean Meats, Fish, and Poultry (2 servings or less daily. One serving is 3 to 4 oz [85-114 g]).  Eat More Often: Ninety percent or leaner ground beef, tenderloin, sirloin. Round cuts of beef, chicken breast, turkey breast. All fish. Grill, bake, or broil your meat. Nothing should be fried.  Eat Less Often/Avoid: Fatty cuts of meat, turkey, or chicken leg, thigh, or wing. Fried cuts of meat or fish. Dairy (2 to 3 servings)  Eat More Often: Low-fat or fat-free milk, low-fat plain or light yogurt, reduced-fat or part-skim cheese.  Eat Less Often/Avoid: Milk (whole, 2%).Whole milk yogurt. Full-fat cheeses. Nuts, Seeds, and Legumes (4 to 5 servings per week)  Eat More Often: All without added salt.  Eat Less Often/Avoid: Salted nuts and seeds, canned beans with added salt. Fats and Sweets (limited)  Eat More Often: Vegetable oils, tub margarines   without trans fats, sugar-free gelatin. Mayonnaise and salad dressings.  Eat Less Often/Avoid: Coconut oils, palm oils, butter, stick margarine, cream, half and half, cookies, candy, pie. FOR MORE INFORMATION The Dash Diet Eating Plan: www.dashdiet.org Document Released: 10/23/2011 Document Revised: 01/26/2012 Document Reviewed:  10/23/2011 [employer] Patient Information 2014 [employer].  

## 2013-07-27 ENCOUNTER — Other Ambulatory Visit: Payer: Self-pay | Admitting: Family Medicine

## 2013-07-27 LAB — CMP14+EGFR
ALT: 19 IU/L (ref 0–44)
AST: 20 IU/L (ref 0–40)
Albumin/Globulin Ratio: 1.7 (ref 1.1–2.5)
Albumin: 4.5 g/dL (ref 3.5–4.8)
Alkaline Phosphatase: 103 IU/L (ref 39–117)
BUN/Creatinine Ratio: 17 (ref 10–22)
BUN: 17 mg/dL (ref 8–27)
CO2: 26 mmol/L (ref 18–29)
Calcium: 10 mg/dL (ref 8.6–10.2)
Chloride: 98 mmol/L (ref 97–108)
Creatinine, Ser: 1.02 mg/dL (ref 0.76–1.27)
GFR calc Af Amer: 84 mL/min/{1.73_m2} (ref >59)
GFR calc non Af Amer: 73 mL/min/{1.73_m2} (ref >59)
Globulin, Total: 2.6 g/dL (ref 1.5–4.5)
Glucose: 87 mg/dL (ref 65–99)
Potassium: 4.3 mmol/L (ref 3.5–5.2)
Sodium: 139 mmol/L (ref 134–144)
Total Bilirubin: 0.4 mg/dL (ref 0.0–1.2)
Total Protein: 7.1 g/dL (ref 6.0–8.5)

## 2013-07-27 LAB — NMR, LIPOPROFILE
Cholesterol: 220 mg/dL — ABNORMAL HIGH (ref <200)
HDL Cholesterol by NMR: 49 mg/dL (ref >=40)
HDL Particle Number: 34.4 umol/L (ref >=30.5)
LDL Particle Number: 1869 nmol/L — ABNORMAL HIGH (ref <1000)
LDL Size: 21 nm (ref >20.5)
LDLC SERPL CALC-MCNC: 136 mg/dL — ABNORMAL HIGH (ref <100)
LP-IR Score: 43 (ref <=45)
Small LDL Particle Number: 806 nmol/L — ABNORMAL HIGH (ref <=527)
Triglycerides by NMR: 175 mg/dL — ABNORMAL HIGH (ref <150)

## 2013-07-27 LAB — PSA, TOTAL AND FREE
PSA, Free Pct: 16.7 %
PSA, Free: 0.05 ng/mL
PSA: 0.3 ng/mL (ref 0.0–4.0)

## 2013-07-27 MED ORDER — PRAVASTATIN SODIUM 20 MG PO TABS: 20.0000 mg | ORAL_TABLET | Freq: Every day | ORAL | Status: DC

## 2013-09-13 ENCOUNTER — Telehealth: Payer: Self-pay | Admitting: Family Medicine

## 2013-09-13 ENCOUNTER — Other Ambulatory Visit: Payer: Self-pay | Admitting: Family Medicine

## 2013-09-19 ENCOUNTER — Ambulatory Visit (INDEPENDENT_AMBULATORY_CARE_PROVIDER_SITE_OTHER): Payer: Medicare Other | Admitting: Family Medicine

## 2013-09-19 DIAGNOSIS — Z23 Encounter for immunization: Secondary | ICD-10-CM

## 2013-11-25 ENCOUNTER — Ambulatory Visit (INDEPENDENT_AMBULATORY_CARE_PROVIDER_SITE_OTHER): Payer: Medicare HMO | Admitting: Family Medicine

## 2013-11-25 ENCOUNTER — Ambulatory Visit: Payer: Medicare Other | Admitting: Family Medicine

## 2013-11-25 ENCOUNTER — Telehealth: Payer: Self-pay | Admitting: Family Medicine

## 2013-11-25 ENCOUNTER — Encounter: Payer: Self-pay | Admitting: Family Medicine

## 2013-11-25 VITALS — BP 112/65 | HR 68 | Temp 98.8°F | Ht 72.0 in | Wt 200.0 lb

## 2013-11-25 DIAGNOSIS — J209 Acute bronchitis, unspecified: Secondary | ICD-10-CM

## 2013-11-25 MED ORDER — HYDROCODONE-HOMATROPINE 5-1.5 MG/5ML PO SYRP: 5.0000 mL | ORAL_SOLUTION | Freq: Three times a day (TID) | ORAL | Status: DC | PRN

## 2013-11-25 MED ORDER — AZITHROMYCIN 250 MG PO TABS: ORAL_TABLET | ORAL | Status: DC

## 2013-11-25 NOTE — Patient Instructions (Signed)

## 2013-11-25 NOTE — Telephone Encounter (Signed)
appt scheduled

## 2013-11-25 NOTE — Progress Notes (Signed)
   Subjective:    Patient ID: Sabatino Williard, male    DOB: [birth year], 74 y.o.   MRN: [id number]  HPI This 73 y.o. male presents for evaluation of URI sx's.  He has been having persistent cough.   Review of Systems No chest pain, SOB, HA, dizziness, vision change, N/V, diarrhea, constipation, dysuria, urinary urgency or frequency, myalgias, arthralgias or rash.     Objective:   Physical Exam Vital signs noted  Well developed well nourished male.  HEENT - Head atraumatic Normocephalic                Eyes - PERRLA, Conjuctiva - clear Sclera- Clear EOMI                Ears - EAC's Wnl TM's Wnl Gross Hearing WNL                 Throat - oropharanx wnl Respiratory - Lungs CTA bilateral Cardiac - RRR S1 and S2 without murmur GI - Abdomen soft Nontender and bowel sounds active x 4 Extremities - No edema. Neuro - Grossly intact.       Assessment & Plan:  Acute bronchitis - Plan: azithromycin (ZITHROMAX) 250 MG tablet, HYDROcodone-homatropine (HYCODAN) 5-1.5 MG/5ML syrup  Push po fluids, rest, tylenol and motrin otc prn as directed for fever, arthralgias, and myalgias.  Follow up prn if sx's continue or persist.  Lysbeth Penner FNP

## 2013-11-28 ENCOUNTER — Ambulatory Visit: Payer: Self-pay | Admitting: Sports Medicine

## 2013-12-08 ENCOUNTER — Ambulatory Visit (INDEPENDENT_AMBULATORY_CARE_PROVIDER_SITE_OTHER): Payer: Medicare HMO | Admitting: Family Medicine

## 2013-12-08 ENCOUNTER — Ambulatory Visit (INDEPENDENT_AMBULATORY_CARE_PROVIDER_SITE_OTHER): Payer: Medicare HMO

## 2013-12-08 ENCOUNTER — Encounter: Payer: Self-pay | Admitting: Family Medicine

## 2013-12-08 VITALS — BP 145/87 | HR 65 | Temp 98.4°F | Ht 72.0 in | Wt 200.6 lb

## 2013-12-08 DIAGNOSIS — I10 Essential (primary) hypertension: Secondary | ICD-10-CM

## 2013-12-08 DIAGNOSIS — H409 Unspecified glaucoma: Secondary | ICD-10-CM

## 2013-12-08 DIAGNOSIS — IMO0002 Reserved for concepts with insufficient information to code with codable children: Secondary | ICD-10-CM

## 2013-12-08 DIAGNOSIS — D649 Anemia, unspecified: Secondary | ICD-10-CM

## 2013-12-08 DIAGNOSIS — R059 Cough, unspecified: Secondary | ICD-10-CM

## 2013-12-08 DIAGNOSIS — C61 Malignant neoplasm of prostate: Secondary | ICD-10-CM

## 2013-12-08 DIAGNOSIS — R05 Cough: Secondary | ICD-10-CM

## 2013-12-08 DIAGNOSIS — E785 Hyperlipidemia, unspecified: Secondary | ICD-10-CM

## 2013-12-08 DIAGNOSIS — J310 Chronic rhinitis: Secondary | ICD-10-CM

## 2013-12-08 DIAGNOSIS — D492 Neoplasm of unspecified behavior of bone, soft tissue, and skin: Secondary | ICD-10-CM

## 2013-12-08 LAB — POCT CBC
Granulocyte percent: 62.3 %G (ref 37–80)
HCT, POC: 43.5 % (ref 43.5–53.7)
Hemoglobin: 14 g/dL — AB (ref 14.1–18.1)
Lymph, poc: 2.4 (ref 0.6–3.4)
MCH, POC: 29.8 pg (ref 27–31.2)
MCHC: 32.2 g/dL (ref 31.8–35.4)
MCV: 92.7 fL (ref 80–97)
MPV: 7.9 fL (ref 0–99.8)
POC Granulocyte: 4.5 (ref 2–6.9)
POC LYMPH PERCENT: 33.2 %L (ref 10–50)
Platelet Count, POC: 321 10*3/uL (ref 142–424)
RBC: 4.7 M/uL (ref 4.69–6.13)
RDW, POC: 14.6 %
WBC: 7.3 10*3/uL (ref 4.6–10.2)

## 2013-12-08 MED ORDER — FELODIPINE ER 5 MG PO TB24: 5.0000 mg | ORAL_TABLET | Freq: Every day | ORAL | Status: DC

## 2013-12-08 MED ORDER — ENALAPRIL-HYDROCHLOROTHIAZIDE 5-12.5 MG PO TABS: 1.0000 | ORAL_TABLET | Freq: Every day | ORAL | Status: DC

## 2013-12-08 MED ORDER — FLUTICASONE PROPIONATE 50 MCG/ACT NA SUSP: 2.0000 | Freq: Every day | NASAL | Status: DC

## 2013-12-08 NOTE — Progress Notes (Signed)
Patient ID: Joshua Compton, male   DOB: 30-May-1941, 73 y.o.   MRN: [id number] SUBJECTIVE: CC: Chief Complaint  Patient presents with  . Follow-up    4 month follow up chronic problems check mole on rt occipital area      HPI:  Patient is here for follow up of hyperlipidemia/HTN/arthritis: denies Headache;denies Chest Pain;denies weakness;denies Shortness of Breath and orthopnea;denies Visual changes;denies palpitations;denies cough;denies pedal edema;denies symptoms of TIA or stroke;deniesClaudication symptoms. admits to Compliance with medications; denies Problems with medications.  Bronchitis residual cough especially in the evening  Prostate cancer: doing fine. Has appointment next month with Dr Jeffie Pollock.   Past Medical History  Diagnosis Date  . Hypertension     dr Jacelyn Grip     [place]   fm  . Hyperlipidemia   . Glaucoma   . Cancer     prostate ca   seed implants  . Arthritis    Past Surgical History  Procedure Laterality Date  . Back surgery      x2  . Neck surgery    . Transurethral resection of prostate    . Prostate surgery      prsatated cancer  . Hernia repair  11-06-2009    left inguinal repair    History   Social History  . Marital Status: Married    Spouse Name: N/A    Number of Children: N/A  . Years of Education: N/A   Occupational History  . Not on file.   Social History Main Topics  . Smoking status: Former Smoker    Types: Cigarettes    Quit date: 06/04/2011  . Smokeless tobacco: Not on file  . Alcohol Use: No  . Drug Use: No  . Sexual Activity: Not on file   Other Topics Concern  . Not on file   Social History Narrative  . No narrative on file   Family History  Problem Relation Age of Onset  . Cancer Mother     breast  . Heart disease Father    Current Outpatient Prescriptions on File Prior to Visit  Medication Sig Dispense Refill  . azithromycin (ZITHROMAX) 250 MG tablet Take 2 po first day and then one po qd x 4 days  6  tablet  1  . bimatoprost (LUMIGAN) 0.03 % ophthalmic solution Place 1 drop into both eyes at bedtime.      . brimonidine (ALPHAGAN) 0.2 % ophthalmic solution Place 1 drop into both eyes daily.      Marland Kitchen HYDROcodone-homatropine (HYCODAN) 5-1.5 MG/5ML syrup Take 5 mLs by mouth every 8 (eight) hours as needed for cough.  120 mL  0  . ibuprofen (ADVIL,MOTRIN) 200 MG tablet Take 200 mg by mouth every 6 (six) hours as needed for pain.      Marland Kitchen timolol (BETIMOL) 0.5 % ophthalmic solution Place 1 drop into both eyes daily.       No current facility-administered medications on file prior to visit.   Allergies  Allergen Reactions  . Pravastatin Sodium     Muscle aches   Immunization History  Administered Date(s) Administered  . Influenza,inj,Quad PF,36+ Mos 09/19/2013  . Pneumococcal Polysaccharide-23 07/19/2011  . Tdap 07/19/2011   Prior to Admission medications   Medication Sig Start Date End Date Taking? Authorizing Provider  azithromycin (ZITHROMAX) 250 MG tablet Take 2 po first day and then one po qd x 4 days 11/25/13   Lysbeth Penner, FNP  bimatoprost (LUMIGAN) 0.03 % ophthalmic solution Place 1 drop into both  eyes at bedtime.    Historical Provider, MD  brimonidine (ALPHAGAN) 0.2 % ophthalmic solution Place 1 drop into both eyes daily.    Historical Provider, MD  Enalapril-Hydrochlorothiazide 5-12.5 MG per tablet Take 1 tablet by mouth daily. 07/25/13   Vernie Shanks, MD  felodipine (PLENDIL) 5 MG 24 hr tablet Take 1 tablet (5 mg total) by mouth daily. 07/25/13   Vernie Shanks, MD  HYDROcodone-homatropine [facility]) 5-1.5 MG/5ML syrup Take 5 mLs by mouth every 8 (eight) hours as needed for cough. 11/25/13   Lysbeth Penner, FNP  ibuprofen (ADVIL,MOTRIN) 200 MG tablet Take 200 mg by mouth every 6 (six) hours as needed for pain.    Historical Provider, MD  timolol (BETIMOL) 0.5 % ophthalmic solution Place 1 drop into both eyes daily.    Historical Provider, MD     ROS: As above in the HPI. All  other systems are stable or negative.  OBJECTIVE: APPEARANCE:  Patient in no acute distress.The patient appeared well nourished and normally developed. Acyanotic. Waist: VITAL SIGNS:BP 145/87  Pulse 65  Temp(Src) 98.4 F (36.9 C) (Oral)  Ht 6' (1.829 m)  Wt 200 lb 9.6 oz (90.992 kg)  BMI 27.20 kg/m2 AAM  SKIN: warm and  Dry without overt rashes, tattoos and scars. Thee is a 3 mm verrucous lesion of the right occiput.  HEAD and Neck: without JVD, Head and scalp: normal Eyes:No scleral icterus. Fundi normal, eye movements normal. Ears: Auricle normal, canal normal, Tympanic membranes normal, insufflation normal. Nose: normal Throat: normal Neck & thyroid: normal  CHEST & LUNGS: Chest wall: normal Lungs: Clear  CVS: Reveals the PMI to be normally located. Regular rhythm, First and Second Heart sounds are normal,  absence of murmurs, rubs or gallops. Peripheral vasculature: Radial pulses: normal Dorsal pedis pulses: normal Posterior pulses: normal  ABDOMEN:  Appearance: normal Benign, no organomegaly, no masses, no Abdominal Aortic enlargement. No Guarding , no rebound. No Bruits. Bowel sounds: normal  RECTAL: N/A GU: N/A  EXTREMETIES: nonedematous.  MUSCULOSKELETAL:  Spine: normal Joints: intact  NEUROLOGIC: oriented to time,place and person; nonfocal. Strength is normal Sensory is normal Reflexes are normal Cranial Nerves are normal. Results for orders placed in visit on 12/08/13  POCT CBC      Result Value Range   WBC 7.3  4.6 - 10.2 K/uL   Lymph, poc 2.4  0.6 - 3.4   POC LYMPH PERCENT 33.2  10 - 50 %L   POC Granulocyte 4.5  2 - 6.9   Granulocyte percent 62.3  37 - 80 %G   RBC 4.7  4.69 - 6.13 M/uL   Hemoglobin 14.0 (*) 14.1 - 18.1 g/dL   HCT, POC 43.5  43.5 - 53.7 %   MCV 92.7  80 - 97 fL   MCH, POC 29.8  27 - 31.2 pg   MCHC 32.2  31.8 - 35.4 g/dL   RDW, POC 14.6     Platelet Count, POC 321.0  142 - 424 K/uL   MPV 7.9  0 - 99.8 fL     ASSESSMENT:  Cough - Plan: DG Chest 2 View  Anemia - Plan: POCT CBC  Degenerative disc disease  Glaucoma  Hyperlipidemia - Plan: CMP14+EGFR, NMR, lipoprofile  Hypertension - Plan: CMP14+EGFR, Enalapril-Hydrochlorothiazide 5-12.5 MG per tablet, felodipine (PLENDIL) 5 MG 24 hr tablet  Prostate cancer  Rhinitis - Plan: fluticasone (FLONASE) 50 MCG/ACT nasal spray  Neoplasm of scalp  PLAN:      Dr Dub Mikes  Joshua Compton's Recommendations  For nutrition information, I recommend books:  1).Eat to Live by Dr Excell Seltzer. 2).Prevent and Reverse Heart Disease by Dr Karl Luke. 3) Dr Janene Harvey Book:  Program to Reverse Diabetes  Exercise recommendations are:  If unable to walk, then the patient can exercise in a chair 3 times a day. By flapping arms like a bird gently and raising legs outwards to the front.  If ambulatory, the patient can go for walks for 30 minutes 3 times a week. Then increase the intensity and duration as tolerated.  Goal is to try to attain exercise frequency to 5 times a week.  If applicable: Best to perform resistance exercises (machines or weights) 2 days a week and cardio type exercises 3 days per week.  Orders Placed This Encounter  Procedures  . DG Chest 2 View    Standing Status: Future     Number of Occurrences: 1     Standing Expiration Date: 02/06/2015    Order Specific Question:  Reason for Exam (SYMPTOM  OR DIAGNOSIS REQUIRED)    Answer:  cough    Order Specific Question:  Preferred imaging location?    Answer:  Internal  . CMP14+EGFR  . NMR, lipoprofile  . POCT CBC  [facility] reading (PRIMARY) by  Dr. Jacelyn Grip: chronic changes.                                   Meds ordered this encounter  Medications  . Enalapril-Hydrochlorothiazide 5-12.5 MG per tablet    Sig: Take 1 tablet by mouth daily.    Dispense:  30 tablet    Refill:  5  . felodipine (PLENDIL) 5 MG 24 hr tablet    Sig: Take 1 tablet (5 mg total) by mouth daily.     Dispense:  30 tablet    Refill:  5  . fluticasone (FLONASE) 50 MCG/ACT nasal spray    Sig: Place 2 sprays into both nostrils daily.    Dispense:  16 g    Refill:  3   Medications Discontinued During This Encounter  Medication Reason  . Enalapril-Hydrochlorothiazide 5-12.5 MG per tablet Reorder  . felodipine (PLENDIL) 5 MG 24 hr tablet Reorder   Return in about 2 weeks (around 12/22/2013) for to excise skin tumor of scalp, 2 line appointment.  Joshua Compton P. Jacelyn Grip, M.D.

## 2013-12-08 NOTE — Patient Instructions (Signed)
      Dr Jourdyn Ferrin's Recommendations  For nutrition information, I recommend books:  1).Eat to Live by Dr Joel Fuhrman. 2).Prevent and Reverse Heart Disease by Dr Caldwell Esselstyn. 3) Dr Neal Barnard's Book:  Program to Reverse Diabetes  Exercise recommendations are:  If unable to walk, then the patient can exercise in a chair 3 times a day. By flapping arms like a bird gently and raising legs outwards to the front.  If ambulatory, the patient can go for walks for 30 minutes 3 times a week. Then increase the intensity and duration as tolerated.  Goal is to try to attain exercise frequency to 5 times a week.  If applicable: Best to perform resistance exercises (machines or weights) 2 days a week and cardio type exercises 3 days per week.  

## 2013-12-09 ENCOUNTER — Other Ambulatory Visit: Payer: Self-pay | Admitting: Family Medicine

## 2013-12-09 ENCOUNTER — Telehealth: Payer: Self-pay | Admitting: Family Medicine

## 2013-12-09 DIAGNOSIS — J209 Acute bronchitis, unspecified: Secondary | ICD-10-CM

## 2013-12-09 MED ORDER — HYDROCODONE-HOMATROPINE 5-1.5 MG/5ML PO SYRP: 5.0000 mL | ORAL_SOLUTION | Freq: Three times a day (TID) | ORAL | Status: DC | PRN

## 2013-12-09 NOTE — Telephone Encounter (Signed)
Rx ready for pick up. 

## 2013-12-09 NOTE — Telephone Encounter (Signed)
Pt aware rx ready for pick up. 

## 2013-12-10 LAB — CMP14+EGFR
ALT: 18 IU/L (ref 0–44)
AST: 21 IU/L (ref 0–40)
Albumin/Globulin Ratio: 1.4 (ref 1.1–2.5)
Albumin: 4.4 g/dL (ref 3.5–4.8)
Alkaline Phosphatase: 104 IU/L (ref 39–117)
BUN/Creatinine Ratio: 13 (ref 10–22)
BUN: 13 mg/dL (ref 8–27)
CO2: 22 mmol/L (ref 18–29)
Calcium: 9.9 mg/dL (ref 8.6–10.2)
Chloride: 100 mmol/L (ref 97–108)
Creatinine, Ser: 1.01 mg/dL (ref 0.76–1.27)
GFR calc Af Amer: 86 mL/min/{1.73_m2} (ref >59)
GFR calc non Af Amer: 74 mL/min/{1.73_m2} (ref >59)
Globulin, Total: 3.1 g/dL (ref 1.5–4.5)
Glucose: 80 mg/dL (ref 65–99)
Potassium: 4.1 mmol/L (ref 3.5–5.2)
Sodium: 139 mmol/L (ref 134–144)
Total Bilirubin: 0.5 mg/dL (ref 0.0–1.2)
Total Protein: 7.5 g/dL (ref 6.0–8.5)

## 2013-12-10 LAB — NMR, LIPOPROFILE
Cholesterol: 193 mg/dL (ref <200)
HDL Cholesterol by NMR: 45 mg/dL (ref >=40)
HDL Particle Number: 28.1 umol/L — ABNORMAL LOW (ref >=30.5)
LDL Particle Number: 1532 nmol/L — ABNORMAL HIGH (ref <1000)
LDL Size: 21.1 nm (ref >20.5)
LDLC SERPL CALC-MCNC: 118 mg/dL — ABNORMAL HIGH (ref <100)
LP-IR Score: 45 (ref <=45)
Small LDL Particle Number: 578 nmol/L — ABNORMAL HIGH (ref <=527)
Triglycerides by NMR: 152 mg/dL — ABNORMAL HIGH (ref <150)

## 2013-12-27 ENCOUNTER — Encounter: Payer: Self-pay | Admitting: Family Medicine

## 2013-12-27 ENCOUNTER — Ambulatory Visit (INDEPENDENT_AMBULATORY_CARE_PROVIDER_SITE_OTHER): Payer: Medicare HMO | Admitting: Family Medicine

## 2013-12-27 VITALS — BP 148/72 | HR 65 | Temp 97.5°F | Ht 72.0 in | Wt 203.2 lb

## 2013-12-27 DIAGNOSIS — G893 Neoplasm related pain (acute) (chronic): Secondary | ICD-10-CM

## 2013-12-27 DIAGNOSIS — L989 Disorder of the skin and subcutaneous tissue, unspecified: Secondary | ICD-10-CM

## 2013-12-27 NOTE — Progress Notes (Signed)
Patient ID: Joshua Compton, male   DOB: [birth year], 73 y.o.   MRN: [id number] SUBJECTIVE: CC: Chief Complaint  Patient presents with  . Follow-up    wants scalp lesion excised    HPI: Here to have scalp lesion removed.  Past Medical History  Diagnosis Date  . Hypertension     dr Jacelyn Grip     [place]   fm  . Hyperlipidemia   . Glaucoma   . Cancer     prostate ca   seed implants  . Arthritis    Past Surgical History  Procedure Laterality Date  . Back surgery      x2  . Neck surgery    . Transurethral resection of prostate    . Prostate surgery      prsatated cancer  . Hernia repair  11-06-2009    left inguinal repair    History   Social History  . Marital Status: Married    Spouse Name: N/A    Number of Children: N/A  . Years of Education: N/A   Occupational History  . Not on file.   Social History Main Topics  . Smoking status: Former Smoker    Types: Cigarettes    Quit date: 06/04/2011  . Smokeless tobacco: Not on file  . Alcohol Use: No  . Drug Use: No  . Sexual Activity: Not on file   Other Topics Concern  . Not on file   Social History Narrative  . No narrative on file   Family History  Problem Relation Age of Onset  . Cancer Mother     breast  . Heart disease Father    Current Outpatient Prescriptions on File Prior to Visit  Medication Sig Dispense Refill  . bimatoprost (LUMIGAN) 0.03 % ophthalmic solution Place 1 drop into both eyes at bedtime.      . brimonidine (ALPHAGAN) 0.2 % ophthalmic solution Place 1 drop into both eyes daily.      . Enalapril-Hydrochlorothiazide 5-12.5 MG per tablet Take 1 tablet by mouth daily.  30 tablet  5  . felodipine (PLENDIL) 5 MG 24 hr tablet Take 1 tablet (5 mg total) by mouth daily.  30 tablet  5  . fluticasone (FLONASE) 50 MCG/ACT nasal spray Place 2 sprays into both nostrils daily.  16 g  3  . HYDROcodone-homatropine (HYCODAN) 5-1.5 MG/5ML syrup Take 5 mLs by mouth every 8 (eight) hours as needed for  cough.  120 mL  0  . ibuprofen (ADVIL,MOTRIN) 200 MG tablet Take 200 mg by mouth every 6 (six) hours as needed for pain.      Marland Kitchen timolol (BETIMOL) 0.5 % ophthalmic solution Place 1 drop into both eyes daily.      Marland Kitchen azithromycin (ZITHROMAX) 250 MG tablet Take 2 po first day and then one po qd x 4 days  6 tablet  1   No current facility-administered medications on file prior to visit.   Allergies  Allergen Reactions  . Pravastatin Sodium     Muscle aches   Immunization History  Administered Date(s) Administered  . Influenza,inj,Quad PF,36+ Mos 09/19/2013  . Pneumococcal Polysaccharide-23 07/19/2011  . Tdap 07/19/2011   Prior to Admission medications   Medication Sig Start Date End Date Taking? Authorizing Provider  bimatoprost (LUMIGAN) 0.03 % ophthalmic solution Place 1 drop into both eyes at bedtime.   Yes Historical Provider, MD  brimonidine (ALPHAGAN) 0.2 % ophthalmic solution Place 1 drop into both eyes daily.   Yes Historical Provider, MD  Enalapril-Hydrochlorothiazide 5-12.5 MG per tablet Take 1 tablet by mouth daily. 12/08/13  Yes Vernie Shanks, MD  felodipine (PLENDIL) 5 MG 24 hr tablet Take 1 tablet (5 mg total) by mouth daily. 12/08/13  Yes Vernie Shanks, MD  fluticasone (FLONASE) 50 MCG/ACT nasal spray Place 2 sprays into both nostrils daily. 12/08/13  Yes Vernie Shanks, MD  HYDROcodone-homatropine [facility]) 5-1.5 MG/5ML syrup Take 5 mLs by mouth every 8 (eight) hours as needed for cough. 12/09/13  Yes Vernie Shanks, MD  ibuprofen (ADVIL,MOTRIN) 200 MG tablet Take 200 mg by mouth every 6 (six) hours as needed for pain.   Yes Historical Provider, MD  timolol (BETIMOL) 0.5 % ophthalmic solution Place 1 drop into both eyes daily.   Yes Historical Provider, MD  timolol (TIMOPTIC) 0.5 % ophthalmic solution  12/20/13  Yes Historical Provider, MD  azithromycin (ZITHROMAX) 250 MG tablet Take 2 po first day and then one po qd x 4 days 11/25/13   Lysbeth Penner, FNP     ROS: As above  in the HPI. All other systems are stable or negative.  OBJECTIVE: APPEARANCE:  Patient in no acute distress.The patient appeared well nourished and normally developed. Acyanotic. Waist: VITAL SIGNS:BP 148/72  Pulse 65  Temp(Src) 97.5 F (36.4 C) (Oral)  Ht 6' (1.829 m)  Wt 203 lb 3.2 oz (92.171 kg)  BMI 27.55 kg/m2 AAM   SKIN: warm and  Dry without overt rashes, tattoos and scars. 5 mm fleshy papular tumor on the right parieto-occipital area Area was cleaned and draped with sterile drape. 2cc of Lido with epi was used for local anesthesia with excellent effect. A punch Biopsy was used to remove the lesion in total and the specimen sent for pathology. Silver nitrate was used for initial hemostasis temporarily while 2 3-0 sutres was used to cles the wound with excellent closure and hemostasis.   Patient tolerATED THE PROCEDURE WITHOUT DISCOMFORT EXCEPT FOR THE INITIAL ANESTHESIA.  BLOOD LOSS FEW DROPS. bacitacin applied to wound.  ASSESSMENT: Neoplasm related pain (acute) (chronic) - Plan: Pathology  PLAN: Specimen sent  To pathology. Keep clean and dry. If Washing scalp with shampoo and water just running through and pad dry. Do not irritate the sutures to avoid them coming apart. Call if any problems.  No orders of the defined types were placed in this encounter.   Meds ordered this encounter  Medications  . timolol (TIMOPTIC) 0.5 % ophthalmic solution    Sig:    There are no discontinued medications. Return in about 9 days (around 01/05/2014) for suture removal.  Apostolos Blagg P. Jacelyn Grip, M.D.

## 2013-12-29 LAB — PATHOLOGY

## 2013-12-31 NOTE — Progress Notes (Signed)
Quick Note:  Call Patient Labs that are abnormal: The excisional biopsy is a wart, not a cancer Recommendations: No changes.   ______

## 2014-01-06 ENCOUNTER — Encounter (INDEPENDENT_AMBULATORY_CARE_PROVIDER_SITE_OTHER): Payer: Self-pay

## 2014-01-06 ENCOUNTER — Ambulatory Visit (INDEPENDENT_AMBULATORY_CARE_PROVIDER_SITE_OTHER): Payer: Medicare HMO | Admitting: Family Medicine

## 2014-01-06 DIAGNOSIS — Z4802 Encounter for removal of sutures: Secondary | ICD-10-CM

## 2014-01-06 NOTE — Progress Notes (Signed)
Patient ID: Joshua Compton, male   DOB: [birth year], 73 y.o.   MRN: [id number] SUBJECTIVE: CC: Chief Complaint  Patient presents with  . Suture / Staple Removal    HPI: Had a punch excisional Bx of the the scalp. The area has healed nicely. No problems.  Past Medical History  Diagnosis Date  . Hypertension     dr Jacelyn Grip     [place]   fm  . Hyperlipidemia   . Glaucoma   . Cancer     prostate ca   seed implants  . Arthritis    Past Surgical History  Procedure Laterality Date  . Back surgery      x2  . Neck surgery    . Transurethral resection of prostate    . Prostate surgery      prsatated cancer  . Hernia repair  11-06-2009    left inguinal repair    History   Social History  . Marital Status: Married    Spouse Name: N/A    Number of Children: N/A  . Years of Education: N/A   Occupational History  . Not on file.   Social History Main Topics  . Smoking status: Former Smoker    Types: Cigarettes    Quit date: 06/04/2011  . Smokeless tobacco: Not on file  . Alcohol Use: No  . Drug Use: No  . Sexual Activity: Not on file   Other Topics Concern  . Not on file   Social History Narrative  . No narrative on file   Family History  Problem Relation Age of Onset  . Cancer Mother     breast  . Heart disease Father    Current Outpatient Prescriptions on File Prior to Visit  Medication Sig Dispense Refill  . azithromycin (ZITHROMAX) 250 MG tablet Take 2 po first day and then one po qd x 4 days  6 tablet  1  . bimatoprost (LUMIGAN) 0.03 % ophthalmic solution Place 1 drop into both eyes at bedtime.      . brimonidine (ALPHAGAN) 0.2 % ophthalmic solution Place 1 drop into both eyes daily.      . Enalapril-Hydrochlorothiazide 5-12.5 MG per tablet Take 1 tablet by mouth daily.  30 tablet  5  . felodipine (PLENDIL) 5 MG 24 hr tablet Take 1 tablet (5 mg total) by mouth daily.  30 tablet  5  . fluticasone (FLONASE) 50 MCG/ACT nasal spray Place 2 sprays into both  nostrils daily.  16 g  3  . HYDROcodone-homatropine (HYCODAN) 5-1.5 MG/5ML syrup Take 5 mLs by mouth every 8 (eight) hours as needed for cough.  120 mL  0  . ibuprofen (ADVIL,MOTRIN) 200 MG tablet Take 200 mg by mouth every 6 (six) hours as needed for pain.      Marland Kitchen timolol (BETIMOL) 0.5 % ophthalmic solution Place 1 drop into both eyes daily.      . timolol (TIMOPTIC) 0.5 % ophthalmic solution        No current facility-administered medications on file prior to visit.   Allergies  Allergen Reactions  . Pravastatin Sodium     Muscle aches   Immunization History  Administered Date(s) Administered  . Influenza,inj,Quad PF,36+ Mos 09/19/2013  . Pneumococcal Polysaccharide-23 07/19/2011  . Tdap 07/19/2011   Prior to Admission medications   Medication Sig Start Date End Date Taking? Authorizing Provider  azithromycin (ZITHROMAX) 250 MG tablet Take 2 po first day and then one po qd x 4 days 11/25/13   Gwyndolyn Saxon  J Oxford, FNP  bimatoprost (LUMIGAN) 0.03 % ophthalmic solution Place 1 drop into both eyes at bedtime.    Historical Provider, MD  brimonidine (ALPHAGAN) 0.2 % ophthalmic solution Place 1 drop into both eyes daily.    Historical Provider, MD  Enalapril-Hydrochlorothiazide 5-12.5 MG per tablet Take 1 tablet by mouth daily. 12/08/13   Vernie Shanks, MD  felodipine (PLENDIL) 5 MG 24 hr tablet Take 1 tablet (5 mg total) by mouth daily. 12/08/13   Vernie Shanks, MD  fluticasone (FLONASE) 50 MCG/ACT nasal spray Place 2 sprays into both nostrils daily. 12/08/13   Vernie Shanks, MD  HYDROcodone-homatropine [facility]) 5-1.5 MG/5ML syrup Take 5 mLs by mouth every 8 (eight) hours as needed for cough. 12/09/13   Vernie Shanks, MD  ibuprofen (ADVIL,MOTRIN) 200 MG tablet Take 200 mg by mouth every 6 (six) hours as needed for pain.    Historical Provider, MD  timolol (BETIMOL) 0.5 % ophthalmic solution Place 1 drop into both eyes daily.    Historical Provider, MD  timolol (TIMOPTIC) 0.5 % ophthalmic  solution  12/20/13   Historical Provider, MD     ROS: As above in the HPI. All other systems are stable or negative.  OBJECTIVE: APPEARANCE:  Patient in no acute distress.The patient appeared well nourished and normally developed. Acyanotic. Waist: VITAL SIGNS:There were no vitals taken for this visit. AAM NAD  SKIN: warm and  Dry without overt rashes, tattoos. The area of the biopsy has healed and the 2 sutures were removed. Without problems.   HEAD and Neck: without JVD, Head and scalp: normal   Results for orders placed in visit on 12/27/13  PATHOLOGY      Result Value Ref Range   PATH REPORT.SITE OF ORIGIN SPEC Comment     PATH REPORT.FINAL DX SPEC Comment     PATH REPORT.FINAL DX SPEC Comment     SIGNED OUT BY: Comment     GROSS DESCRIPTION: Comment     PATHOLOGIST PROVIDED ICD9: Comment     PAYMENT PROCEDURE Comment     Verrucous wart   ASSESSMENT:  Encounter for removal of sutures  PLAN: Sutures removed. Routine follow up.  No orders of the defined types were placed in this encounter.   No orders of the defined types were placed in this encounter.   There are no discontinued medications. Return in about 10 weeks (around 03/17/2014) for Recheck medical problems.  Auri Jahnke P. Jacelyn Grip, M.D.

## 2014-03-17 ENCOUNTER — Ambulatory Visit (INDEPENDENT_AMBULATORY_CARE_PROVIDER_SITE_OTHER): Payer: Medicare HMO | Admitting: Family Medicine

## 2014-03-17 ENCOUNTER — Encounter: Payer: Self-pay | Admitting: Family Medicine

## 2014-03-17 VITALS — BP 135/80 | HR 54 | Temp 97.0°F | Ht 72.0 in | Wt 200.0 lb

## 2014-03-17 DIAGNOSIS — E785 Hyperlipidemia, unspecified: Secondary | ICD-10-CM

## 2014-03-17 DIAGNOSIS — IMO0002 Reserved for concepts with insufficient information to code with codable children: Secondary | ICD-10-CM

## 2014-03-17 DIAGNOSIS — I10 Essential (primary) hypertension: Secondary | ICD-10-CM

## 2014-03-17 DIAGNOSIS — H409 Unspecified glaucoma: Secondary | ICD-10-CM

## 2014-03-17 MED ORDER — FELODIPINE ER 5 MG PO TB24: 5.0000 mg | ORAL_TABLET | Freq: Every day | ORAL | Status: DC

## 2014-03-17 MED ORDER — ENALAPRIL-HYDROCHLOROTHIAZIDE 5-12.5 MG PO TABS: 1.0000 | ORAL_TABLET | Freq: Every day | ORAL | Status: DC

## 2014-03-17 NOTE — Patient Instructions (Signed)
DASH Diet  The DASH diet stands for "Dietary Approaches to Stop Hypertension." It is a healthy eating plan that has been shown to reduce high blood pressure (hypertension) in as little as 14 days, while also possibly providing other significant health benefits. These other health benefits include reducing the risk of breast cancer after menopause and reducing the risk of type 2 diabetes, heart disease, colon cancer, and stroke. Health benefits also include weight loss and slowing kidney failure in patients with chronic kidney disease.   DIET GUIDELINES  · Limit salt (sodium). Your diet should contain less than 1500 mg of sodium daily.  · Limit refined or processed carbohydrates. Your diet should include mostly whole grains. Desserts and added sugars should be used sparingly.  · Include small amounts of heart-healthy fats. These types of fats include nuts, oils, and tub margarine. Limit saturated and trans fats. These fats have been shown to be harmful in the body.  CHOOSING FOODS   The following food groups are based on a 2000 calorie diet. See your Registered Dietitian for individual calorie needs.  Grains and Grain Products (6 to 8 servings daily)  · Eat More Often: Whole-wheat bread, brown rice, whole-grain or wheat pasta, quinoa, popcorn without added fat or salt (air popped).  · Eat Less Often: White bread, white pasta, white rice, cornbread.  Vegetables (4 to 5 servings daily)  · Eat More Often: Fresh, frozen, and canned vegetables. Vegetables may be raw, steamed, roasted, or grilled with a minimal amount of fat.  · Eat Less Often/Avoid: Creamed or fried vegetables. Vegetables in a cheese sauce.  Fruit (4 to 5 servings daily)  · Eat More Often: All fresh, canned (in natural juice), or frozen fruits. Dried fruits without added sugar. One hundred percent fruit juice (½ cup [237 mL] daily).  · Eat Less Often: Dried fruits with added sugar. Canned fruit in light or heavy syrup.  Lean Meats, Fish, and Poultry (2  servings or less daily. One serving is 3 to 4 oz [85-114 g]).  · Eat More Often: Ninety percent or leaner ground beef, tenderloin, sirloin. Round cuts of beef, chicken breast, turkey breast. All fish. Grill, bake, or broil your meat. Nothing should be fried.  · Eat Less Often/Avoid: Fatty cuts of meat, turkey, or chicken leg, thigh, or wing. Fried cuts of meat or fish.  Dairy (2 to 3 servings)  · Eat More Often: Low-fat or fat-free milk, low-fat plain or light yogurt, reduced-fat or part-skim cheese.  · Eat Less Often/Avoid: Milk (whole, 2%). Whole milk yogurt. Full-fat cheeses.  Nuts, Seeds, and Legumes (4 to 5 servings per week)  · Eat More Often: All without added salt.  · Eat Less Often/Avoid: Salted nuts and seeds, canned beans with added salt.  Fats and Sweets (limited)  · Eat More Often: Vegetable oils, tub margarines without trans fats, sugar-free gelatin. Mayonnaise and salad dressings.  · Eat Less Often/Avoid: Coconut oils, palm oils, butter, stick margarine, cream, half and half, cookies, candy, pie.  FOR MORE INFORMATION  The Dash Diet Eating Plan: www.dashdiet.org  Document Released: 10/23/2011 Document Revised: 01/26/2012 Document Reviewed: 10/23/2011  [employer]® Patient Information ©2014 [employer].

## 2014-03-17 NOTE — Progress Notes (Signed)
Patient ID: Joshua Compton, male   DOB: 11-Mar-1941, 73 y.o.   MRN: [id number] SUBJECTIVE: CC: Chief Complaint  Patient presents with  . Medical Management of Chronic Issues    10 week f/u on chronic medical conditions    HPI: Patient is here for follow up of hyperlipidemia/HTN: denies Headache;denies Chest Pain;denies weakness;denies Shortness of Breath and orthopnea;denies Visual changes;denies palpitations;denies cough;denies pedal edema;denies symptoms of TIA or stroke;deniesClaudication symptoms. admits to Compliance with medications; denies Problems with medications.   doing well. No complaints.  Glaucoma  Doing Okay.  Mild back ache today due to rain and cold weather.  Past Medical History  Diagnosis Date  . Hypertension     dr Jacelyn Grip     [place]   fm  . Hyperlipidemia   . Glaucoma   . Cancer     prostate ca   seed implants  . Arthritis    Past Surgical History  Procedure Laterality Date  . Back surgery      x2  . Neck surgery    . Transurethral resection of prostate    . Prostate surgery      prsatated cancer  . Hernia repair  11-06-2009    left inguinal repair    History   Social History  . Marital Status: Married    Spouse Name: N/A    Number of Children: N/A  . Years of Education: N/A   Occupational History  . Not on file.   Social History Main Topics  . Smoking status: Former Smoker    Types: Cigarettes    Quit date: 06/04/2011  . Smokeless tobacco: Not on file  . Alcohol Use: No  . Drug Use: No  . Sexual Activity: Not on file   Other Topics Concern  . Not on file   Social History Narrative  . No narrative on file   Family History  Problem Relation Age of Onset  . Cancer Mother     breast  . Heart disease Father    Current Outpatient Prescriptions on File Prior to Visit  Medication Sig Dispense Refill  . bimatoprost (LUMIGAN) 0.03 % ophthalmic solution Place 1 drop into both eyes at bedtime.      . brimonidine (ALPHAGAN) 0.2 %  ophthalmic solution Place 1 drop into both eyes daily.      Marland Kitchen ibuprofen (ADVIL,MOTRIN) 200 MG tablet Take 200 mg by mouth every 6 (six) hours as needed for pain.      Marland Kitchen timolol (BETIMOL) 0.5 % ophthalmic solution Place 1 drop into both eyes daily.      . timolol (TIMOPTIC) 0.5 % ophthalmic solution        No current facility-administered medications on file prior to visit.   Allergies  Allergen Reactions  . Pravastatin Sodium     Muscle aches   Immunization History  Administered Date(s) Administered  . Influenza,inj,Quad PF,36+ Mos 09/19/2013  . Pneumococcal Polysaccharide-23 07/19/2011  . Tdap 07/19/2011   Prior to Admission medications   Medication Sig Start Date End Date Taking? Authorizing Provider  bimatoprost (LUMIGAN) 0.03 % ophthalmic solution Place 1 drop into both eyes at bedtime.   Yes Historical Provider, MD  brimonidine (ALPHAGAN) 0.2 % ophthalmic solution Place 1 drop into both eyes daily.   Yes Historical Provider, MD  Enalapril-Hydrochlorothiazide 5-12.5 MG per tablet Take 1 tablet by mouth daily. 12/08/13  Yes Vernie Shanks, MD  felodipine (PLENDIL) 5 MG 24 hr tablet Take 1 tablet (5 mg total) by mouth daily. 12/08/13  Yes Vernie Shanks, MD  ibuprofen (ADVIL,MOTRIN) 200 MG tablet Take 200 mg by mouth every 6 (six) hours as needed for pain.   Yes Historical Provider, MD  timolol (BETIMOL) 0.5 % ophthalmic solution Place 1 drop into both eyes daily.   Yes Historical Provider, MD  timolol (TIMOPTIC) 0.5 % ophthalmic solution  12/20/13  Yes Historical Provider, MD     ROS: As above in the HPI. All other systems are stable or negative.  OBJECTIVE: APPEARANCE:  Patient in no acute distress.The patient appeared well nourished and normally developed. Acyanotic. Waist: VITAL SIGNS:BP 135/80  Pulse 54  Temp(Src) 97 F (36.1 C) (Oral)  Ht 6' (1.829 m)  Wt 200 lb (90.719 kg)  BMI 27.12 kg/m2 AAM   SKIN: warm and  Dry without overt rashes, tattoos and scars  HEAD  and Neck: without JVD, Head and scalp: normal Eyes:No scleral icterus. Fundi normal, eye movements normal. Ears: Auricle normal, canal normal, Tympanic membranes normal, insufflation normal. Nose: normal Throat: normal Neck & thyroid: normal  CHEST & LUNGS: Chest wall: normal Lungs: Clear  CVS: Reveals the PMI to be normally located. Regular rhythm, First and Second Heart sounds are normal,  absence of murmurs, rubs or gallops. Peripheral vasculature: Radial pulses: normal Dorsal pedis pulses: normal Posterior pulses: normal  ABDOMEN:  Appearance: normal Benign, no organomegaly, no masses, no Abdominal Aortic enlargement. No Guarding , no rebound. No Bruits. Bowel sounds: normal  RECTAL: N/A GU: N/A  EXTREMETIES: nonedematous.  MUSCULOSKELETAL:  Spine: normal Joints: intact  NEUROLOGIC: oriented to time,place and person; nonfocal. Strength is normal Sensory is normal Reflexes are normal Cranial Nerves are normal.   ASSESSMENT: Hypertension - Plan: CMP14+EGFR, Enalapril-Hydrochlorothiazide 5-12.5 MG per tablet, felodipine (PLENDIL) 5 MG 24 hr tablet  Hyperlipidemia - Plan: NMR, lipoprofile  Glaucoma  Degenerative disc disease Stable  PLAN: Dash diet in the AVS.  Orders Placed This Encounter  Procedures  . CMP14+EGFR  . NMR, lipoprofile   Meds ordered this encounter  Medications  . Enalapril-Hydrochlorothiazide 5-12.5 MG per tablet    Sig: Take 1 tablet by mouth daily.    Dispense:  30 tablet    Refill:  5  . felodipine (PLENDIL) 5 MG 24 hr tablet    Sig: Take 1 tablet (5 mg total) by mouth daily.    Dispense:  30 tablet    Refill:  5   Medications Discontinued During This Encounter  Medication Reason  . azithromycin (ZITHROMAX) 250 MG tablet Completed Course  . fluticasone (FLONASE) 50 MCG/ACT nasal spray Patient has not taken in last 30 days  . HYDROcodone-homatropine (HYCODAN) 5-1.5 MG/5ML syrup Completed Course  .  Enalapril-Hydrochlorothiazide 5-12.5 MG per tablet Reorder  . felodipine (PLENDIL) 5 MG 24 hr tablet Reorder   Return in about 3 months (around 06/17/2014) for Recheck medical problems.  Burna Atlas P. Jacelyn Grip, M.D.

## 2014-03-18 LAB — CMP14+EGFR
ALT: 17 IU/L (ref 0–44)
AST: 18 IU/L (ref 0–40)
Albumin/Globulin Ratio: 1.7 (ref 1.1–2.5)
Albumin: 4.7 g/dL (ref 3.5–4.8)
Alkaline Phosphatase: 108 IU/L (ref 39–117)
BUN/Creatinine Ratio: 16 (ref 10–22)
BUN: 18 mg/dL (ref 8–27)
CO2: 25 mmol/L (ref 18–29)
Calcium: 9.8 mg/dL (ref 8.6–10.2)
Chloride: 98 mmol/L (ref 97–108)
Creatinine, Ser: 1.11 mg/dL (ref 0.76–1.27)
GFR calc Af Amer: 76 mL/min/{1.73_m2} (ref >59)
GFR calc non Af Amer: 66 mL/min/{1.73_m2} (ref >59)
Globulin, Total: 2.7 g/dL (ref 1.5–4.5)
Glucose: 85 mg/dL (ref 65–99)
Potassium: 4 mmol/L (ref 3.5–5.2)
Sodium: 139 mmol/L (ref 134–144)
Total Bilirubin: 0.4 mg/dL (ref 0.0–1.2)
Total Protein: 7.4 g/dL (ref 6.0–8.5)

## 2014-03-18 LAB — NMR, LIPOPROFILE
Cholesterol: 225 mg/dL — ABNORMAL HIGH (ref <200)
HDL Cholesterol by NMR: 52 mg/dL (ref >=40)
HDL Particle Number: 33.2 umol/L (ref >=30.5)
LDL Particle Number: 1862 nmol/L — ABNORMAL HIGH (ref <1000)
LDL Size: 21.2 nm (ref >20.5)
LDLC SERPL CALC-MCNC: 144 mg/dL — ABNORMAL HIGH (ref <100)
LP-IR Score: 65 — ABNORMAL HIGH (ref <=45)
Small LDL Particle Number: 679 nmol/L — ABNORMAL HIGH (ref <=527)
Triglycerides by NMR: 143 mg/dL (ref <150)

## 2014-03-19 NOTE — Progress Notes (Signed)
Quick Note:  Lipids are not at goal. He had a problem with pravastatin Need to see Clinical Pharmacist/Tammy for patient education, medication review and adjustment to achieve goals. ______

## 2014-03-28 ENCOUNTER — Telehealth: Payer: Self-pay | Admitting: Family Medicine

## 2014-03-28 NOTE — Telephone Encounter (Signed)
Message copied by Cline Crock on Tue Mar 28, 2014  2:08 PM ------      Message from: Vernie Shanks      Created: Sun Mar 19, 2014  8:52 PM       Lipids are not at goal. He had a problem with pravastatin      Need to see  Clinical Pharmacist/Tammy for patient education, medication review and adjustment to achieve goals. ------

## 2014-04-13 ENCOUNTER — Encounter: Payer: Self-pay | Admitting: *Deleted

## 2014-07-26 ENCOUNTER — Ambulatory Visit (INDEPENDENT_AMBULATORY_CARE_PROVIDER_SITE_OTHER): Payer: Medicare Other | Admitting: Family Medicine

## 2014-07-26 ENCOUNTER — Encounter: Payer: Self-pay | Admitting: Family Medicine

## 2014-07-26 VITALS — BP 137/76 | HR 58 | Temp 97.6°F | Ht 72.0 in | Wt 204.0 lb

## 2014-07-26 DIAGNOSIS — I10 Essential (primary) hypertension: Secondary | ICD-10-CM

## 2014-07-26 DIAGNOSIS — M25519 Pain in unspecified shoulder: Secondary | ICD-10-CM

## 2014-07-26 DIAGNOSIS — Z125 Encounter for screening for malignant neoplasm of prostate: Secondary | ICD-10-CM

## 2014-07-26 DIAGNOSIS — E785 Hyperlipidemia, unspecified: Secondary | ICD-10-CM

## 2014-07-26 MED ORDER — ENALAPRIL-HYDROCHLOROTHIAZIDE 5-12.5 MG PO TABS: 1.0000 | ORAL_TABLET | Freq: Every day | ORAL | Status: DC

## 2014-07-26 MED ORDER — FELODIPINE ER 5 MG PO TB24: 5.0000 mg | ORAL_TABLET | Freq: Every day | ORAL | Status: DC

## 2014-07-26 NOTE — Progress Notes (Signed)
   Subjective:    Patient ID: Joshua Compton, male    DOB: 1941/01/09, 73 y.o.   MRN: [id number]  HPI  73 year old gentleman who is here to check his blood pressure and left shoulder pain. He has no side effects from his blood pressure medicine it is doing well and I think we should probably continue same. Regarding his shoulder, he was playing golf and felt some discomfort. There is no history of fall or trauma. Since he felt the symptom initially it has improved. He can now raise his arms above shoulder height.    Review of Systems  Musculoskeletal:       L shoulder pain  All other systems reviewed and are negative.      Objective:   Physical Exam  Constitutional: He is oriented to person, place, and time. He appears well-developed and well-nourished.  HENT:  Head: Normocephalic.  Right Ear: External ear normal.  Left Ear: External ear normal.  Nose: Nose normal.  Mouth/Throat: Oropharynx is clear and moist.  Eyes: Conjunctivae and EOM are normal. Pupils are equal, round, and reactive to light.  Neck: Normal range of motion. Neck supple.  Cardiovascular: Normal rate, regular rhythm, normal heart sounds and intact distal pulses.   Pulmonary/Chest: Effort normal and breath sounds normal.  Abdominal: Soft. Bowel sounds are normal.  Musculoskeletal: Normal range of motion.  L shoulder  Nl ROM, strength  Neurological: He is alert and oriented to person, place, and time.  Skin: Skin is warm and dry.  Psychiatric: He has a normal mood and affect. His behavior is normal. Judgment and thought content normal.    BP 137/76  Pulse 58  Temp(Src) 97.6 F (36.4 C) (Oral)  Ht 6' (1.829 m)  Wt 204 lb (92.534 kg)  BMI 27.66 kg/m2      Assessment & Plan:  1. Essential hypertension Doing well; no change needed  2. Hyperlipidemia Will follow at next visit; no med  3. Shoulder pain Probable deltoid tendon strain; imprved; will follow  Wardell Honour MD

## 2014-07-26 NOTE — Addendum Note (Signed)
Addended by: Pollyann Kennedy F on: 07/26/2014 03:14 PM   Modules accepted: Orders

## 2014-07-27 LAB — BMP8+EGFR
BUN/Creatinine Ratio: 19 (ref 10–22)
BUN: 22 mg/dL (ref 8–27)
CALCIUM: 10 mg/dL (ref 8.6–10.2)
CHLORIDE: 101 mmol/L (ref 97–108)
CO2: 24 mmol/L (ref 18–29)
CREATININE: 1.17 mg/dL (ref 0.76–1.27)
GFR calc Af Amer: 71 mL/min/{1.73_m2} (ref >59)
GFR calc non Af Amer: 61 mL/min/{1.73_m2} (ref >59)
GLUCOSE: 90 mg/dL (ref 65–99)
Potassium: 4.4 mmol/L (ref 3.5–5.2)
Sodium: 141 mmol/L (ref 134–144)

## 2014-07-27 LAB — LIPID PANEL
CHOL/HDL RATIO: 4.9 ratio (ref 0.0–5.0)
Cholesterol, Total: 247 mg/dL — ABNORMAL HIGH (ref 100–199)
HDL: 50 mg/dL (ref >39)
LDL CALC: 169 mg/dL — AB (ref 0–99)
TRIGLYCERIDES: 141 mg/dL (ref 0–149)
VLDL CHOLESTEROL CAL: 28 mg/dL (ref 5–40)

## 2014-07-27 LAB — PSA, TOTAL AND FREE
PSA, Free Pct: >10 %
PSA, Free: 0.01 ng/mL

## 2014-07-27 LAB — HEPATIC FUNCTION PANEL
ALBUMIN: 4.4 g/dL (ref 3.5–4.8)
ALT: 20 IU/L (ref 0–44)
AST: 19 IU/L (ref 0–40)
Alkaline Phosphatase: 93 IU/L (ref 39–117)
BILIRUBIN TOTAL: 0.4 mg/dL (ref 0.0–1.2)
Bilirubin, Direct: 0.11 mg/dL (ref 0.00–0.40)
TOTAL PROTEIN: 7.4 g/dL (ref 6.0–8.5)

## 2014-08-11 ENCOUNTER — Ambulatory Visit (INDEPENDENT_AMBULATORY_CARE_PROVIDER_SITE_OTHER): Payer: Medicare Other | Admitting: Family Medicine

## 2014-08-11 ENCOUNTER — Encounter: Payer: Self-pay | Admitting: Family Medicine

## 2014-08-11 VITALS — BP 101/62 | HR 60 | Temp 98.6°F | Ht 72.0 in | Wt 204.8 lb

## 2014-08-11 DIAGNOSIS — M25519 Pain in unspecified shoulder: Secondary | ICD-10-CM

## 2014-08-11 DIAGNOSIS — M25512 Pain in left shoulder: Secondary | ICD-10-CM

## 2014-08-11 NOTE — Progress Notes (Signed)
Patient ID: Joshua Compton, male   DOB: [birth year], 73 y.o.   MRN: [id number] S: L shoulder pain: at last visit complained of pain but stated that symptoms were improving   Now pain is worse  Seems to be in anterolat aspect  O: L shoulder nl ROM strength; nl int/ext rotation                         Drop arm negative  A: Likekly tendonitis vs bursitis  P: Inject with 1 cc depoMedrol and Marcaine. Pt tolerated     Do gentle ROM and stretching     If no better, consider MRI  Wardell Honour MD

## 2014-08-30 ENCOUNTER — Other Ambulatory Visit: Payer: Self-pay | Admitting: Family Medicine

## 2014-08-30 ENCOUNTER — Telehealth: Payer: Self-pay | Admitting: Family Medicine

## 2014-08-30 MED ORDER — EZETIMIBE 10 MG PO TABS: 10.0000 mg | ORAL_TABLET | Freq: Every day | ORAL | Status: DC

## 2014-08-30 NOTE — Telephone Encounter (Signed)
Message copied by Waverly Ferrari on Wed Aug 30, 2014  2:08 PM ------      Message from: Wardell Honour      Created: Wed Aug 30, 2014  1:06 PM       Could try Zetia 10 mg/day ------

## 2014-10-26 ENCOUNTER — Ambulatory Visit: Payer: Medicare Other | Admitting: Nurse Practitioner

## 2014-11-02 ENCOUNTER — Ambulatory Visit (INDEPENDENT_AMBULATORY_CARE_PROVIDER_SITE_OTHER): Payer: Medicare Other | Admitting: Family Medicine

## 2014-11-02 ENCOUNTER — Encounter: Payer: Self-pay | Admitting: Family Medicine

## 2014-11-02 VITALS — BP 139/79 | HR 71 | Temp 98.2°F | Ht 72.0 in | Wt 202.2 lb

## 2014-11-02 DIAGNOSIS — I10 Essential (primary) hypertension: Secondary | ICD-10-CM

## 2014-11-02 DIAGNOSIS — S43422D Sprain of left rotator cuff capsule, subsequent encounter: Secondary | ICD-10-CM

## 2014-11-02 MED ORDER — EZETIMIBE 10 MG PO TABS: 10.0000 mg | ORAL_TABLET | Freq: Every day | ORAL | Status: DC

## 2014-11-02 MED ORDER — FELODIPINE ER 5 MG PO TB24: 5.0000 mg | ORAL_TABLET | Freq: Every day | ORAL | Status: DC

## 2014-11-02 MED ORDER — ENALAPRIL-HYDROCHLOROTHIAZIDE 5-12.5 MG PO TABS: 1.0000 | ORAL_TABLET | Freq: Every day | ORAL | Status: DC

## 2014-11-02 NOTE — Progress Notes (Signed)
   Subjective:    Patient ID: Joshua Compton, male    DOB: 12-11-1940, 73 y.o.   MRN: [id number]  HPI follow-up visit for blood pressure and lipids. He has been on Zetia for about one month. Would like to repeat lipids at next visit. Still complaining of left shoulder pain. Has seen chiropractor recently without relief. It does affect his quality of sleep at night.    Review of Systems  HENT: Negative.   Respiratory: Negative.   Cardiovascular: Negative.   Gastrointestinal: Negative.   Genitourinary: Negative.   Musculoskeletal: Positive for arthralgias.  Psychiatric/Behavioral: Negative.        Objective:   Physical Exam  Musculoskeletal:  L shoulder: pain with int rotATION; no efeect from injection Suspect rotator cuff injury  Skin: There is pallor.   BP 139/79 mmHg  Pulse 71  Temp(Src) 98.2 F (36.8 C) (Oral)  Ht 6' (1.829 m)  Wt 202 lb 3.2 oz (91.717 kg)  BMI 27.42 kg/m2       Assessment & Plan:  Rotator cuff (capsule) sprain, left, subsequent encounter - Plan: Ambulatory referral to Sports Medicine  Essential hypertension

## 2014-11-14 ENCOUNTER — Encounter: Payer: Self-pay | Admitting: Sports Medicine

## 2014-11-14 ENCOUNTER — Ambulatory Visit (INDEPENDENT_AMBULATORY_CARE_PROVIDER_SITE_OTHER): Payer: Medicare Other | Admitting: Sports Medicine

## 2014-11-14 VITALS — BP 137/68 | HR 57 | Ht 69.0 in | Wt 204.0 lb

## 2014-11-14 DIAGNOSIS — M25512 Pain in left shoulder: Secondary | ICD-10-CM

## 2014-11-14 NOTE — Progress Notes (Signed)
   Subjective:    Patient ID: Joshua Compton, male    DOB: Apr 18, 1941, 73 y.o.   MRN: [id number]  HPI chief complaint: Left shoulder pain  Very pleasant 73 year old right-hand-dominant male comes in today complaining of 2 months of left shoulder pain. Pain began suddenly while golfing. While on his back swing he felt a pop and some pain in the posterior aspect of his shoulder. Over the past 2 months his pain has improved but not resolved. He describes catching and painful popping in the shoulder with certain motions. He is having difficulty reaching out away from his body or reaching overhead. Some difficulty sleeping at night as well. He did not notice any swelling or bruising after his injury. He denies any problems with his shoulder prior to his injury. No neck pain. No associated numbness or tingling. He has had x-rays done at a local chiropractor's office which showed some "mild arthritis "per his report. He has also had a subacromial cortisone injection administered by his primary care physician which did help him temporarily.  Past medical history reviewed Medications reviewed Allergies reviewed    Review of Systems     Objective:   Physical Exam Well-developed, well-nourished. No acute distress. Sitting In the exam room. Vital signs reviewed.  Left shoulder: Full range of motion. No tenderness to palpation along the clavicle nor over the ac joint. No tenderness over the bicipital groove. No gross deformity. Patient has 4/5 strength with resisted supraspinatus but only mild pain. 5/5 strength with resisted internal rotation and external rotation. Positive O'Brien. Positive clunk. No significant crepitus with passive internal and external rotation. Neurovascularly intact distally.  Right shoulder: Full painless range of motion. Rotator cuff strength 5/5. No signs of impingement. Negative O'Brien's. Neurovascularly intact distally.       Assessment & Plan:  Left shoulder pain  likely secondary to degenerative labral tear versus glenohumeral osteoarthritis-rule out rotator cuff tear  Patient has failed conservative treatment to date including a cortisone injection. Going to proceed with an MRI scan to rule out rotator cuff pathology as well as a possible labral tear. Patient will follow-up with me in the office 1-2 days after that study to discuss those results and delineate further treatment.

## 2014-11-17 HISTORY — PX: ROTATOR CUFF REPAIR: SHX139

## 2014-11-25 ENCOUNTER — Other Ambulatory Visit: Payer: Medicare Other

## 2014-11-25 ENCOUNTER — Ambulatory Visit
Admission: RE | Admit: 2014-11-25 | Discharge: 2014-11-25 | Disposition: A | Discharge status: Home / Self Care | Payer: Medicare Other | Source: Ambulatory Visit | Attending: Sports Medicine | Admitting: Sports Medicine

## 2014-11-25 DIAGNOSIS — M19012 Primary osteoarthritis, left shoulder: Secondary | ICD-10-CM | POA: Diagnosis not present

## 2014-11-25 DIAGNOSIS — M25512 Pain in left shoulder: Secondary | ICD-10-CM

## 2014-11-25 DIAGNOSIS — M7592 Shoulder lesion, unspecified, left shoulder: Secondary | ICD-10-CM | POA: Diagnosis not present

## 2014-11-25 DIAGNOSIS — M75112 Incomplete rotator cuff tear or rupture of left shoulder, not specified as traumatic: Secondary | ICD-10-CM | POA: Diagnosis not present

## 2014-11-27 ENCOUNTER — Encounter: Payer: Self-pay | Admitting: Sports Medicine

## 2014-11-27 ENCOUNTER — Ambulatory Visit (INDEPENDENT_AMBULATORY_CARE_PROVIDER_SITE_OTHER): Payer: Medicare Other | Admitting: Sports Medicine

## 2014-11-27 VITALS — BP 107/66 | Ht 72.0 in | Wt 204.0 lb

## 2014-11-27 DIAGNOSIS — M75102 Unspecified rotator cuff tear or rupture of left shoulder, not specified as traumatic: Secondary | ICD-10-CM | POA: Diagnosis not present

## 2014-11-27 NOTE — Progress Notes (Signed)
Patient ID: Joshua Compton, male   DOB: [birth year], 74 y.o.   MRN: [id number]   Patient comes in today to discuss MRI findings of his left shoulder. MRI does show a full-thickness tear of the supraspinatus tendon 2 cm from the peripheral insertion. There is 13 mm of retraction. No obvious labral tear. Significant AC joint DJD and mild glenohumeral OA. Based on these findings I recommended consultation with Dr.Landau to discuss treatment options. Patient is very active and wants to be able to return to golf. I will defer further treatment to the discretion of Dr. Mardelle Matte.

## 2014-11-27 NOTE — Patient Instructions (Signed)
DR [facility] [place] [phone] [facility] 11/29/14 AT 930AM ARRIVE AT 915AM

## 2014-11-29 DIAGNOSIS — M75102 Unspecified rotator cuff tear or rupture of left shoulder, not specified as traumatic: Secondary | ICD-10-CM | POA: Diagnosis not present

## 2014-12-05 ENCOUNTER — Encounter: Payer: Self-pay | Admitting: *Deleted

## 2014-12-14 DIAGNOSIS — S46012A Strain of muscle(s) and tendon(s) of the rotator cuff of left shoulder, initial encounter: Secondary | ICD-10-CM | POA: Diagnosis not present

## 2014-12-14 DIAGNOSIS — M7541 Impingement syndrome of right shoulder: Secondary | ICD-10-CM | POA: Diagnosis not present

## 2014-12-14 DIAGNOSIS — M75122 Complete rotator cuff tear or rupture of left shoulder, not specified as traumatic: Secondary | ICD-10-CM | POA: Diagnosis not present

## 2014-12-14 DIAGNOSIS — Y929 Unspecified place or not applicable: Secondary | ICD-10-CM | POA: Diagnosis not present

## 2014-12-14 DIAGNOSIS — M19012 Primary osteoarthritis, left shoulder: Secondary | ICD-10-CM | POA: Diagnosis not present

## 2014-12-14 DIAGNOSIS — S46011A Strain of muscle(s) and tendon(s) of the rotator cuff of right shoulder, initial encounter: Secondary | ICD-10-CM | POA: Diagnosis not present

## 2014-12-14 DIAGNOSIS — M7542 Impingement syndrome of left shoulder: Secondary | ICD-10-CM | POA: Diagnosis not present

## 2014-12-14 DIAGNOSIS — G8918 Other acute postprocedural pain: Secondary | ICD-10-CM | POA: Diagnosis not present

## 2014-12-14 DIAGNOSIS — X58XXXA Exposure to other specified factors, initial encounter: Secondary | ICD-10-CM | POA: Diagnosis not present

## 2014-12-21 DIAGNOSIS — M75122 Complete rotator cuff tear or rupture of left shoulder, not specified as traumatic: Secondary | ICD-10-CM | POA: Diagnosis not present

## 2015-01-02 DIAGNOSIS — H4011X3 Primary open-angle glaucoma, severe stage: Secondary | ICD-10-CM | POA: Diagnosis not present

## 2015-01-02 DIAGNOSIS — H47232 Glaucomatous optic atrophy, left eye: Secondary | ICD-10-CM | POA: Diagnosis not present

## 2015-01-18 DIAGNOSIS — M75122 Complete rotator cuff tear or rupture of left shoulder, not specified as traumatic: Secondary | ICD-10-CM | POA: Diagnosis not present

## 2015-01-23 ENCOUNTER — Ambulatory Visit: Payer: Medicare Other | Attending: Orthopedic Surgery | Admitting: Physical Therapy

## 2015-01-23 ENCOUNTER — Encounter: Payer: Self-pay | Admitting: Physical Therapy

## 2015-01-23 DIAGNOSIS — M25612 Stiffness of left shoulder, not elsewhere classified: Secondary | ICD-10-CM | POA: Diagnosis not present

## 2015-01-23 DIAGNOSIS — M25512 Pain in left shoulder: Secondary | ICD-10-CM

## 2015-01-23 NOTE — Therapy (Signed)
[facility] [place], [place], [address] Phone: [phone]   Fax:  [phone]  Physical Therapy Evaluation  Patient Details  Name: Joshua Compton MRN: [id number] Date of Birth: April 21, 1941 Referring Provider:  Marchia Bond, MD  Encounter Date: 01/23/2015      PT End of Session - 01/23/15 1420    Visit Number 1   Number of Visits 12   Date for PT Re-Evaluation 03/20/15   PT Start Time 0150   PT Stop Time 0220   PT Time Calculation (min) 30 min   Activity Tolerance Patient tolerated treatment well   Behavior During Therapy [facility] for tasks assessed/performed      History reviewed. No pertinent past medical history.  History reviewed. No pertinent past surgical history.  There were no vitals taken for this visit.  Visit Diagnosis:  Left shoulder pain - Plan: PT plan of care cert/re-cert  Shoulder stiffness, left - Plan: PT plan of care cert/re-cert      Subjective Assessment - 01/23/15 1355    Symptoms 12/14/14 surgery date  Been doing home exercises.  Lifting arm alot.  Cautioned him to avoid to as to not reinjure.   Patient Stated Goals Return to golf.   Currently in Pain? Yes   Pain Score 2    Pain Location Shoulder   Pain Orientation Left   Pain Descriptors / Indicators Aching   Pain Type Surgical pain   Pain Onset More than a month ago   Pain Frequency Intermittent   Aggravating Factors  Moving arm against gravity into abduction which I instructed him to avoid.   Pain Relieving Factors Rest.   Effect of Pain on Daily Activities Cannot golf.   Multiple Pain Sites Yes          [facility] PT Assessment - 01/23/15 0001    Assessment   Medical Diagnosis Left shoulder s/p RTC repair   Onset Date 12/14/14   Precautions   Precautions Shoulder   Type of Shoulder Precautions Per Dr. Luanna Cole protocol.   Balance Screen   Has the patient fallen in the past 6 months No   Has the patient had a decrease in  activity level because of a fear of falling?  No   Is the patient reluctant to leave their home because of a fear of falling?  No   Posture/Postural Control   Posture Comments Not wearing sling today.   ROM / Strength   AROM / PROM / Strength PROM   PROM   PROM Assessment Site Shoulder   Right/Left Shoulder Left   Left Shoulder Flexion 105 Degrees   Left Shoulder External Rotation 58 Degrees   Palpation   Palpation --  No palpable pain today.                  [facility] Adult PT Treatment/Exercise - 01/23/15 0001    Manual Therapy   Manual Therapy Passive ROM   Passive ROM Gentle PROM into left shoulder flexion and ER x 8 minutes.                     PT Long Term Goals - 01/23/15 1427    PT LONG TERM GOAL #1   Title Ind with HEP.   Time 8   Period Weeks   Status New   PT LONG TERM GOAL #2   Title ncrease left shoulder active flexion to 155 degrees.   Time 8   Period Weeks  Status New   PT LONG TERM GOAL #3   Title Increase left shoulder ER to 80 degrees   Time 8   Period Weeks   Status New   PT LONG TERM GOAL #4   Title Touch L2 with left index finger.   Time 8   Period Weeks   Status New   PT LONG TERM GOAL #5   Title Increase left shoulder strength to 5/5   Time 8   Period Weeks   Status New   Additional Long Term Goals   Additional Long Term Goals Yes   PT LONG TERM GOAL #6   Title Perform ADL's with pain not > 2/10   Time 8   Period Weeks   Status New               Plan - 02/05/2015 1421    Clinical Impression Statement The patient underwent a left shoulder RTC repair surgery on 12/14/14.  He has been using his arm actively against gravity.  I instructed him to avoid this as we willfollow a protocol provided by Dr. Mardelle Matte.  Patient's resting pain-level is a low 2/10.   Pt will benefit from skilled therapeutic intervention in order to improve on the following deficits Impaired flexibility;Decreased activity tolerance;Pain   Rehab  Potential Excellent   PT Frequency 2x / week   PT Duration 8 weeks   PT Treatment/Interventions ADLs/Self Care Home Management;Cryotherapy;Electrical Stimulation;Moist Heat;Therapeutic activities;Patient/family education;Passive range of motion;Therapeutic exercise   PT Next Visit Plan PROM to increase left shoulder flexion and ER.  Supine cane exercises.  Please follow protocol.   Consulted and Agree with Plan of Care Patient          G-Codes - 02/05/2015 1430    Functional Assessment Tool Used FOTO   Functional Limitation Mobility: Walking and moving around   Mobility: Walking and Moving Around Current Status [phone]) At least 20 percent but less than 40 percent impaired, limited or restricted   Mobility: Walking and Moving Around Goal Status (Q3009) At least 1 percent but less than 20 percent impaired, limited or restricted       Problem List There are no active problems to display for this patient.   APPLEGATE, [place] MPT February 05, 2015, 2:33 PM  [facility] [address] [place], [place], [address] Phone: [phone]   Fax:  [phone]

## 2015-01-30 ENCOUNTER — Encounter: Payer: Medicare Other | Admitting: Physical Therapy

## 2015-01-31 ENCOUNTER — Ambulatory Visit: Payer: Medicare Other | Admitting: Physical Therapy

## 2015-01-31 DIAGNOSIS — M25612 Stiffness of left shoulder, not elsewhere classified: Secondary | ICD-10-CM

## 2015-01-31 DIAGNOSIS — M25512 Pain in left shoulder: Secondary | ICD-10-CM | POA: Diagnosis not present

## 2015-01-31 NOTE — Therapy (Signed)
[facility] [place], [place], [address] Phone: [phone]   Fax:  [phone]  Physical Therapy Treatment  Patient Details  Name: Joshua Compton MRN: [id number] Date of Birth: [birth year] Referring Provider:  Marchia Bond, MD  Encounter Date: 01/31/2015      PT End of Session - 01/31/15 1153    Visit Number 2   Number of Visits 12   Date for PT Re-Evaluation 03/20/15   PT Start Time 1324   PT Stop Time 1157   PT Time Calculation (min) 33 min   Activity Tolerance Patient tolerated treatment well   Behavior During Therapy [facility] for tasks assessed/performed      No past medical history on file.  No past surgical history on file.  There were no vitals filed for this visit.  Visit Diagnosis:  Left shoulder pain  Shoulder stiffness, left      Subjective Assessment - 01/31/15 1128    Symptoms no pain reports just sore.pt said no sling per MD   Patient Stated Goals Return to golf.   Currently in Pain? No/denies            [facility] PT Assessment - 01/31/15 0001    PROM   PROM Assessment Site Shoulder   Right/Left Shoulder Left   Left Shoulder Flexion 135 Degrees   Left Shoulder External Rotation 75 Degrees                   [facility] Adult PT Treatment/Exercise - 01/31/15 0001    Exercises   Exercises Shoulder   Shoulder Exercises: Supine   Other Supine Exercises cane ex's for flexion AAROM 2x10   Shoulder Exercises: Standing   Other Standing Exercises UE ranger for elevation and circles 2x10   Shoulder Exercises: Pulleys   Flexion 3 minutes   Manual Therapy   Manual Therapy Passive ROM   Passive ROM Gentle PROM into left shoulder flexion and ER x 8 minutes.                     PT Long Term Goals - 01/23/15 1427    PT LONG TERM GOAL #1   Title Ind with HEP.   Time 8   Period Weeks   Status New   PT LONG TERM GOAL #2   Title ncrease left shoulder active flexion to 155  degrees.   Time 8   Period Weeks   Status New   PT LONG TERM GOAL #3   Title Increase left shoulder ER to 80 degrees   Time 8   Period Weeks   Status New   PT LONG TERM GOAL #4   Title Touch L2 with left index finger.   Time 8   Period Weeks   Status New   PT LONG TERM GOAL #5   Title Increase left shoulder strength to 5/5   Time 8   Period Weeks   Status New   Additional Long Term Goals   Additional Long Term Goals Yes   PT LONG TERM GOAL #6   Title Perform ADL's with pain not > 2/10   Time 8   Period Weeks   Status New               Plan - 01/31/15 1154    Clinical Impression Statement pt tolerated tx with no pain increase, has improved PROM today. pt understands the importance of RTC protocol to prevent re-injury.Goals ongoing. started AAROM per MPT -  pt tolerated well( surgury date1/28/16)   Pt will benefit from skilled therapeutic intervention in order to improve on the following deficits Impaired flexibility;Decreased activity tolerance;Pain   Rehab Potential Excellent   PT Frequency 2x / week   PT Duration 8 weeks   PT Treatment/Interventions ADLs/Self Care Home Management;Cryotherapy;Electrical Stimulation;Moist Heat;Therapeutic activities;Patient/family education;Passive range of motion;Therapeutic exercise   PT Next Visit Plan cont with POC per MPT/protocol   Consulted and Agree with Plan of Care Patient        Problem List There are no active problems to display for this patient.   Phillips Climes, PTA 01/31/2015, 11:59 AM  [facility] [place], [place], [address] Phone: [phone]   Fax:  [phone]

## 2015-02-06 ENCOUNTER — Ambulatory Visit: Payer: Medicare Other | Admitting: *Deleted

## 2015-02-06 ENCOUNTER — Encounter: Payer: Self-pay | Admitting: *Deleted

## 2015-02-06 DIAGNOSIS — M25512 Pain in left shoulder: Secondary | ICD-10-CM

## 2015-02-06 DIAGNOSIS — M25612 Stiffness of left shoulder, not elsewhere classified: Secondary | ICD-10-CM | POA: Diagnosis not present

## 2015-02-06 NOTE — Therapy (Addendum)
[facility] [place], [place], [address] Phone: [phone]   Fax:  [phone]  Physical Therapy Treatment  Patient Details  Name: Joshua Compton MRN: [id number] Date of Birth: 1941/09/25 Referring Provider:  Marchia Bond, MD  Encounter Date: 02/06/2015    Past Medical History:  Diagnosis Date  . Arthritis   . Cancer [facility])    prostate ca   seed implants  . Glaucoma   . Hyperlipidemia   . Hypertension    dr Jacelyn Grip     [place]   fm    Past Surgical History:  Procedure Laterality Date  . BACK SURGERY     x2  . HERNIA REPAIR  11-06-2009   left inguinal repair   . NECK SURGERY    . PROSTATE SURGERY     prsatated cancer  . ROTATOR CUFF REPAIR Left jan 2016  . TRANSURETHRAL RESECTION OF PROSTATE      There were no vitals filed for this visit.  Visit Diagnosis:  Left shoulder pain  Shoulder stiffness, left                                  PT Long Term Goals - 01/23/15 1427      PT LONG TERM GOAL #1   Title Ind with HEP.   Time 8   Period Weeks   Status New     PT LONG TERM GOAL #2   Title ncrease left shoulder active flexion to 155 degrees.   Time 8   Period Weeks   Status New     PT LONG TERM GOAL #3   Title Increase left shoulder ER to 80 degrees   Time 8   Period Weeks   Status New     PT LONG TERM GOAL #4   Title Touch L2 with left index finger.   Time 8   Period Weeks   Status New     PT LONG TERM GOAL #5   Title Increase left shoulder strength to 5/5   Time 8   Period Weeks   Status New     Additional Long Term Goals   Additional Long Term Goals Yes     PT LONG TERM GOAL #6   Title Perform ADL's with pain not > 2/10   Time 8   Period Weeks   Status New               Problem List Patient Active Problem List   Diagnosis Date Noted  . Neoplasm of scalp 12/08/2013  . Rhinitis 12/08/2013  . Cough 12/08/2013  . Hyperlipidemia   .  Glaucoma   . Cancer ([place])   . Arthritis   . Hypertension 02/04/2011  . Degenerative disc disease 02/04/2011  . Gastrointestinal bleed 02/04/2011  . Anemia 02/04/2011  . Prostate cancer ([place]) 02/04/2011    APPLEGATE, [place], PTA 09/15/2016, 6:54 PM  [facility] [address] [place], [place], [address] Phone: [phone]   Fax:  [phone]  PHYSICAL THERAPY DISCHARGE SUMMARY  Visits from Start of Care: 3.  Current functional level related to goals / functional outcomes: Please see above.   Remaining deficits: Continued left shoulder pain.   Education / Equipment: HEP. Plan: Patient agrees to discharge.  Patient goals were not met. Patient is being discharged due to not returning since the last visit.  ?????  Mali Applegate MPT

## 2015-02-11 ENCOUNTER — Encounter: Payer: Self-pay | Admitting: Sports Medicine

## 2015-02-20 ENCOUNTER — Ambulatory Visit: Payer: Self-pay | Admitting: Family Medicine

## 2015-02-21 DIAGNOSIS — S46012D Strain of muscle(s) and tendon(s) of the rotator cuff of left shoulder, subsequent encounter: Secondary | ICD-10-CM | POA: Diagnosis not present

## 2015-02-22 ENCOUNTER — Ambulatory Visit: Payer: Medicare Other | Admitting: Family Medicine

## 2015-03-08 ENCOUNTER — Encounter: Payer: Self-pay | Admitting: Family Medicine

## 2015-03-08 ENCOUNTER — Ambulatory Visit (INDEPENDENT_AMBULATORY_CARE_PROVIDER_SITE_OTHER): Payer: Medicare Other | Admitting: Family Medicine

## 2015-03-08 VITALS — BP 112/68 | HR 66 | Temp 98.2°F | Ht 72.0 in | Wt 203.0 lb

## 2015-03-08 DIAGNOSIS — E785 Hyperlipidemia, unspecified: Secondary | ICD-10-CM | POA: Diagnosis not present

## 2015-03-08 DIAGNOSIS — I10 Essential (primary) hypertension: Secondary | ICD-10-CM | POA: Diagnosis not present

## 2015-03-08 MED ORDER — ATORVASTATIN CALCIUM 20 MG PO TABS: ORAL_TABLET | ORAL | Status: DC

## 2015-03-08 NOTE — Progress Notes (Signed)
   Subjective:    Patient ID: Joshua Compton, male    DOB: Jul 17, 1941, 74 y.o.   MRN: [id number]  HPI 74 year old gentleman here to follow-up rotator cuff tear. At his last visit he was thought to have rotator cuff tear was sent to sports medicine and then ordered MRI and send him to orthopedist for repair.  He had some physical therapy but seemed to stop prematurely as he was not seeing benefit.  Also his coronary heart disease risk is greater than 10% and LDL cholesterol when last checked was 169. He had been taken Zetia rather than pravastatin, the latter causing some myalgias. He cannot really afford the Zetia.  Chief Complaint  Patient presents with  . Hypertension  . Hyperlipidemia     Patient Active Problem List   Diagnosis Date Noted  . Neoplasm of scalp 12/08/2013  . Rhinitis 12/08/2013  . Cough 12/08/2013  . Hyperlipidemia   . Glaucoma   . Cancer   . Arthritis   . Hypertension 02/04/2011  . Degenerative disc disease 02/04/2011  . Gastrointestinal bleed 02/04/2011  . Anemia 02/04/2011  . Prostate cancer 02/04/2011   Outpatient Encounter Prescriptions as of 03/08/2015  Medication Sig  . bimatoprost (LUMIGAN) 0.03 % ophthalmic solution Place 1 drop into both eyes at bedtime.  . brimonidine (ALPHAGAN) 0.2 % ophthalmic solution Place 1 drop into both eyes daily.  . COMBIGAN 0.2-0.5 % ophthalmic solution   . Enalapril-Hydrochlorothiazide 5-12.5 MG per tablet Take 1 tablet by mouth daily.  Marland Kitchen ezetimibe (ZETIA) 10 MG tablet Take 1 tablet (10 mg total) by mouth daily.  . felodipine (PLENDIL) 5 MG 24 hr tablet Take 1 tablet (5 mg total) by mouth daily.  Marland Kitchen ibuprofen (ADVIL,MOTRIN) 200 MG tablet Take 200 mg by mouth every 6 (six) hours as needed for pain.  Marland Kitchen timolol (BETIMOL) 0.5 % ophthalmic solution Place 1 drop into both eyes daily.  . timolol (TIMOPTIC) 0.5 % ophthalmic solution   . [DISCONTINUED] FLUZONE HIGH-DOSE 0.5 ML SUSY   . [DISCONTINUED] PREVNAR 13 SUSP injection         Review of Systems  Constitutional: Negative.   Respiratory: Negative.   Cardiovascular: Negative.   Gastrointestinal: Negative.   Genitourinary: Negative for frequency.  Psychiatric/Behavioral: Negative.        Objective:   Physical Exam  Constitutional: He is oriented to person, place, and time. He appears well-developed and well-nourished.  Cardiovascular: Normal rate and regular rhythm.   Pulmonary/Chest: Effort normal and breath sounds normal.  Musculoskeletal: Normal range of motion.  Neurological: He is alert and oriented to person, place, and time.  Psychiatric: He has a normal mood and affect. His behavior is normal.    BP 112/68 mmHg  Pulse 66  Temp(Src) 98.2 F (36.8 C) (Oral)  Ht 6' (1.829 m)  Wt 203 lb (92.08 kg)  BMI 27.53 kg/m2       Assessment & Plan:  1. Essential hypertension Blood pressure is well controlled with enalapril hydrochlorothiazide continue same  2. Hyperlipidemia As noted in present illness with increase CHD risk and level of LDL at 169 I think he would benefit from [place]. He did not tolerate older statin but will try atorvastatin 20 mg every other day and repeat lipids in 2 months

## 2015-03-29 ENCOUNTER — Encounter: Payer: Self-pay | Admitting: *Deleted

## 2015-03-29 ENCOUNTER — Ambulatory Visit (INDEPENDENT_AMBULATORY_CARE_PROVIDER_SITE_OTHER): Payer: Medicare Other | Admitting: *Deleted

## 2015-03-29 VITALS — BP 111/70 | HR 53 | Ht 71.5 in | Wt 203.0 lb

## 2015-03-29 DIAGNOSIS — Z0000 Encounter for general adult medical examination without abnormal findings: Secondary | ICD-10-CM | POA: Diagnosis not present

## 2015-03-29 DIAGNOSIS — Z1211 Encounter for screening for malignant neoplasm of colon: Secondary | ICD-10-CM

## 2015-03-29 NOTE — Progress Notes (Signed)
Patient ID: Joshua Compton, male   DOB: 03/28/1941, 74 y.o.   MRN: [id number]    Subjective:   Vernor Monnig is a 74 y.o. male who presents for an Initial Medicare Annual Wellness Visit. Mr Savant is married and lives at home with his wife. He has a history of prostate cancer and is seen by Dr Jeffie Pollock every 6 months. He is also followed by Dr Herbert Deaner every 6 months for glaucoma.   Review of Systems   Cardiac Risk Factors include: advanced age (>72men, >72 women);hypertension;male gender;dyslipidemia Musculoskeletal:Right shoulder pain and stiffness. Noticed this after starting Lipitor.  All other systems negative   Objective:    Today's Vitals   03/29/15 1138  BP: 111/70  Pulse: 53  Height: 5' 11.5" (1.816 m)  Weight: 203 lb (92.08 kg)  BMI            27.92  Current Medications (verified) Outpatient Encounter Prescriptions as of 03/29/2015  Medication Sig  . atorvastatin (LIPITOR) 20 MG tablet Take 1 tab qod(HS)  . bimatoprost (LUMIGAN) 0.03 % ophthalmic solution Place 1 drop into both eyes at bedtime.  . brimonidine (ALPHAGAN) 0.2 % ophthalmic solution Place 1 drop into both eyes daily.  . COMBIGAN 0.2-0.5 % ophthalmic solution   . Enalapril-Hydrochlorothiazide 5-12.5 MG per tablet Take 1 tablet by mouth daily.  . felodipine (PLENDIL) 5 MG 24 hr tablet Take 1 tablet (5 mg total) by mouth daily.  Marland Kitchen ibuprofen (ADVIL,MOTRIN) 200 MG tablet Take 200 mg by mouth every 6 (six) hours as needed for pain.  Marland Kitchen timolol (TIMOPTIC) 0.5 % ophthalmic solution   . [DISCONTINUED] timolol (BETIMOL) 0.5 % ophthalmic solution Place 1 drop into both eyes daily.  . [DISCONTINUED] ezetimibe (ZETIA) 10 MG tablet Take 1 tablet (10 mg total) by mouth daily.   No facility-administered encounter medications on file as of 03/29/2015.    Allergies (verified) Pravastatin sodium -Myalgia  History: Past Medical History  Diagnosis Date  . Hypertension     dr Jacelyn Grip     [place]   fm  . Hyperlipidemia     . Glaucoma   . Cancer     prostate ca   seed implants  . Arthritis    Past Surgical History  Procedure Laterality Date  . Back surgery      x2  . Neck surgery    . Transurethral resection of prostate    . Prostate surgery      prsatated cancer  . Hernia repair  11-06-2009    left inguinal repair   . Rotator cuff repair Left jan 2016   Family History  Problem Relation Age of Onset  . Cancer Mother     breast  . Heart disease Father    Social History   Occupational History  . Not on file.   Social History Main Topics  . Smoking status: Former Smoker    Quit date: 03/28/2005  . Smokeless tobacco: Never Used  . Alcohol Use: No  . Drug Use: No  . Sexual Activity: Not on file    Activities of Daily Living In your present state of health, do you have any difficulty performing the following activities: 03/29/2015  Hearing? N  Vision? Y  Difficulty concentrating or making decisions? N  Walking or climbing stairs? N  Dressing or bathing? N  Doing errands, shopping? N  Preparing Food and eating ? N  Using the Toilet? N  In the past six months, have you accidently leaked urine? N  Do you have problems with loss of bowel control? N  Managing your Medications? N  Managing your Finances? N  Housekeeping or managing your Housekeeping? N    Immunizations and Health Maintenance Immunization History  Administered Date(s) Administered  . Influenza, High Dose Seasonal PF 08/15/2014  . Influenza,inj,Quad PF,36+ Mos 09/19/2013  . Pneumococcal Conjugate-13 08/15/2014  . Pneumococcal Polysaccharide-23 07/19/2011  . Tdap 07/19/2011   Health Maintenance Due  Topic Date Due  . ZOSTAVAX  02/03/2001  . COLONOSCOPY  11/17/2013   Colonoscopy referral to Dr Collene Mares placed. Last one was in 2005 and was normal. We will let him know if she finds it necessary to repeat this.   Patient Care Team: Wardell Honour, MD as PCP - General (Family Medicine) Juanita Craver, MD as Consulting  Physician (Gastroenterology) Jovita Gamma, MD as Consulting Physician (Neurosurgery) Irine Seal, MD as Attending Physician (Urology) Monna Fam, MD as Consulting Physician (Opthalmology)  Assessment:   This is a routine wellness examination for Joshua Compton.   Hearing/Vision screen No hearing or vision deficit noted during visit  Dietary issues and exercise activities discussed: Current Exercise Habits:: Home exercise routine, Type of exercise: treadmill, Time (Minutes): 40, Frequency (Times/Week): 6, Weekly Exercise (Minutes/Week): 240, Intensity: Moderate Patient hasn't used treadmill since having rotator cuff surgery but intends on resuming this routine.  Goals    . Exercise 150 minutes per week (moderate activity)     Walk for at least 30 minutes 3 times per week      Depression Screen PHQ 2/9 Scores 03/29/2015 03/08/2015 11/14/2014 12/08/2013  PHQ - 2 Score 0 0 0 0    Fall Risk Fall Risk  03/29/2015 11/14/2014 12/08/2013  Falls in the past year? No No No    Cognitive Function: MMSE - Mini Mental State Exam 03/29/2015  Orientation to time 5  Orientation to Place 5  Registration 3  Attention/ Calculation 5  Recall 2  Language- name 2 objects 2  Language- repeat 1  Language- follow 3 step command 3  Language- read & follow direction 1  Write a sentence 1  Copy design 1  Total score 29    Screening Tests Health Maintenance  Topic Date Due  . ZOSTAVAX  02/03/2001  . COLONOSCOPY  11/17/2013  . INFLUENZA VACCINE  06/18/2015  . TETANUS/TDAP  07/18/2021  . PNA vac Low Risk Adult  Completed        Plan:  Keep follow up with Dr Sabra Heck on 05/08/15. Evaluate continued need for hypertension meds.  Keep scheduled follow up with Dr Herbert Deaner for eye exam Keep scheduled follow up appointment with Dr Jeffie Pollock in Aug 2016. Patient would like to hold Lipitor for one week and see if arm/shoulder pain improves. He will call back with this information.   During the course of the  visit [place] was educated and counseled about the following appropriate screening and preventive services:   Vaccines to include Pneumoccal-complete, Influenza-suggested annually, Tdap-up to date, Zostavax-declined  Electrocardiogram-Last one was in 2013. Suggested repeat at next office visit.   Colorectal cancer screening-FOBT given. Will see if repeat colonoscopy is needed. Last 2005.  Cardiovascular disease screening-Lipids done at routine office visit  Diabetes screening-Glucose checked at routine office visit  Nutrition counseling-Balanced diet with lean protein, fruits, and vegetables  Exercise-Start back with walking at least 30 minutes 3 days per week  Patient Instructions (the written plan) were given to the patient.   Chong Sicilian, RN   03/29/2015  I have reviewed and agree with the above AWV documentation.  [name], M.D.

## 2015-03-29 NOTE — Patient Instructions (Addendum)
Keep follow up appointment with Dr Sabra Heck on 05/08/15 at 10:00  Follow up scheduled with Dr Jeffie Pollock in the [place] office for 07/06/15 at 8:15 [address], [place] [phone]  We will contact you about your colonoscopy referral.  Walk for at least 30 minutes 3 times per week Eat a balanced diet with lean proteins, fruits, and vegetables   Call back in one week and report if shoulder pain improves   Health Maintenance A healthy lifestyle and preventative care can promote health and wellness.  Maintain regular health, dental, and eye exams.  Eat a healthy diet. Foods like vegetables, fruits, whole grains, low-fat dairy products, and lean protein foods contain the nutrients you need and are low in calories. Decrease your intake of foods high in solid fats, added sugars, and salt. Get information about a proper diet from your health care provider, if necessary.  Regular physical exercise is one of the most important things you can do for your health. Most adults should get at least 150 minutes of moderate-intensity exercise (any activity that increases your heart rate and causes you to sweat) each week. In addition, most adults need muscle-strengthening exercises on 2 or more days a week.   Maintain a healthy weight. The body mass index (BMI) is a screening tool to identify possible weight problems. It provides an estimate of body fat based on height and weight. Your health care provider can find your BMI and can help you achieve or maintain a healthy weight. For males 20 years and older:  A BMI below 18.5 is considered underweight.  A BMI of 18.5 to 24.9 is normal.  A BMI of 25 to 29.9 is considered overweight.  A BMI of 30 and above is considered obese.  Maintain normal blood lipids and cholesterol by exercising and minimizing your intake of saturated fat. Eat a balanced diet with plenty of fruits and vegetables. Blood tests for lipids and cholesterol should begin  at age 69 and be repeated every 5 years. If your lipid or cholesterol levels are high, you are over age 64, or you are at high risk for heart disease, you may need your cholesterol levels checked more frequently.Ongoing high lipid and cholesterol levels should be treated with medicines if diet and exercise are not working.  If you smoke, find out from your health care provider how to quit. If you do not use tobacco, do not start.  Lung cancer screening is recommended for adults aged 40-80 years who are at high risk for developing lung cancer because of a history of smoking. A yearly low-dose CT scan of the lungs is recommended for people who have at least a 30-pack-year history of smoking and are current smokers or have quit within the past 15 years. A pack year of smoking is smoking an average of 1 pack of cigarettes a day for 1 year (for example, a 30-pack-year history of smoking could mean smoking 1 pack a day for 30 years or 2 packs a day for 15 years). Yearly screening should continue until the smoker has stopped smoking for at least 15 years. Yearly screening should be stopped for people who develop a health problem that would prevent them from having lung cancer treatment.  If you choose to drink alcohol, do not have more than 2 drinks per day. One drink is considered to be 12 oz (360 mL) of beer, 5 oz (150 mL) of wine, or 1.5 oz (45 mL) of liquor.  Avoid the use of street drugs. Do not share needles with anyone. Ask for help if you need support or instructions about stopping the use of drugs.  High blood pressure causes heart disease and increases the risk of stroke. Blood pressure should be checked at least every 1-2 years. Ongoing high blood pressure should be treated with medicines if weight loss and exercise are not effective.  If you are 48-50 years old, ask your health care provider if you should take aspirin to prevent heart disease.  Diabetes screening involves taking a blood sample to  check your fasting blood sugar level. This should be done once every 3 years after age 72 if you are at a normal weight and without risk factors for diabetes. Testing should be considered at a younger age or be carried out more frequently if you are overweight and have at least 1 risk factor for diabetes.  Colorectal cancer can be detected and often prevented. Most routine colorectal cancer screening begins at the age of 59 and continues through age 63. However, your health care provider may recommend screening at an earlier age if you have risk factors for colon cancer. On a yearly basis, your health care provider may provide home test kits to check for hidden blood in the stool. A small camera at the end of a tube may be used to directly examine the colon (sigmoidoscopy or colonoscopy) to detect the earliest forms of colorectal cancer. Talk to your health care provider about this at age 62 when routine screening begins. A direct exam of the colon should be repeated every 5-10 years through age 30, unless early forms of precancerous polyps or small growths are found.  People who are at an increased risk for hepatitis B should be screened for this virus. You are considered at high risk for hepatitis B if:  You were born in a country where hepatitis B occurs often. Talk with your health care provider about which countries are considered high risk.  Your parents were born in a high-risk country and you have not received a shot to protect against hepatitis B (hepatitis B vaccine).  You have HIV or AIDS.  You use needles to inject street drugs.  You live with, or have sex with, someone who has hepatitis B.  You are a man who has sex with other men (MSM).  You get hemodialysis treatment.  You take certain medicines for conditions like cancer, organ transplantation, and autoimmune conditions.  Hepatitis C blood testing is recommended for all people born from [birth year] through 1965 and any individual with  known risk factors for hepatitis C.  Healthy men should no longer receive prostate-specific antigen (PSA) blood tests as part of routine cancer screening. Talk to your health care provider about prostate cancer screening.  Testicular cancer screening is not recommended for adolescents or adult males who have no symptoms. Screening includes self-exam, a health care provider exam, and other screening tests. Consult with your health care provider about any symptoms you have or any concerns you have about testicular cancer.  Practice safe sex. Use condoms and avoid high-risk sexual practices to reduce the spread of sexually transmitted infections (STIs).  You should be screened for STIs, including gonorrhea and chlamydia if:  You are sexually active and are younger than 24 years.  You are older than 24 years, and your health care provider tells you that you are at risk for this type of infection.  Your sexual activity has changed since you  were last screened, and you are at an increased risk for chlamydia or gonorrhea. Ask your health care provider if you are at risk.  If you are at risk of being infected with HIV, it is recommended that you take a prescription medicine daily to prevent HIV infection. This is called pre-exposure prophylaxis (PrEP). You are considered at risk if:  You are a man who has sex with other men (MSM).  You are a heterosexual man who is sexually active with multiple partners.  You take drugs by injection.  You are sexually active with a partner who has HIV.  Talk with your health care provider about whether you are at high risk of being infected with HIV. If you choose to begin PrEP, you should first be tested for HIV. You should then be tested every 3 months for as long as you are taking PrEP.  Use sunscreen. Apply sunscreen liberally and repeatedly throughout the day. You should seek shade when your shadow is shorter than you. Protect yourself by wearing long  sleeves, pants, a wide-brimmed hat, and sunglasses year round whenever you are outdoors.  Tell your health care provider of new moles or changes in moles, especially if there is a change in shape or color. Also, tell your health care provider if a mole is larger than the size of a pencil eraser.  A one-time screening for abdominal aortic aneurysm (AAA) and surgical repair of large AAAs by ultrasound is recommended for men aged 50-75 years who are current or former smokers.  Stay current with your vaccines (immunizations). Document Released: 05/01/2008 Document Revised: 11/08/2013 Document Reviewed: 03/31/2011 [facility] Patient Information 2015 [place], [place]. This information is not intended to replace advice given to you by your health care provider. Make sure you discuss any questions you have with your health care provider.

## 2015-04-23 ENCOUNTER — Other Ambulatory Visit: Payer: Self-pay | Admitting: *Deleted

## 2015-04-23 MED ORDER — ATORVASTATIN CALCIUM 20 MG PO TABS: ORAL_TABLET | ORAL | Status: DC

## 2015-05-08 ENCOUNTER — Encounter (INDEPENDENT_AMBULATORY_CARE_PROVIDER_SITE_OTHER): Payer: Self-pay

## 2015-05-08 ENCOUNTER — Encounter: Payer: Self-pay | Admitting: Family Medicine

## 2015-05-08 ENCOUNTER — Ambulatory Visit (INDEPENDENT_AMBULATORY_CARE_PROVIDER_SITE_OTHER): Payer: Medicare Other | Admitting: Family Medicine

## 2015-05-08 VITALS — BP 111/68 | HR 62 | Temp 98.4°F | Ht 71.5 in | Wt 204.0 lb

## 2015-05-08 DIAGNOSIS — I10 Essential (primary) hypertension: Secondary | ICD-10-CM

## 2015-05-08 DIAGNOSIS — E785 Hyperlipidemia, unspecified: Secondary | ICD-10-CM

## 2015-05-08 NOTE — Progress Notes (Signed)
   Subjective:    Patient ID: Joshua Compton, male    DOB: 08/18/1941, 74 y.o.   MRN: [id number]  HPI 74 year old gentleman with hypertension and hyperlipidemia. Blood pressure has been well controlled on enalapril and felodipine but we've had problems finding a statin that he tolerates. When last checked in September, his LDL was 169. He had previously tried pravastatin and most recently a atorvastatin and suffered myalgias with both.  Patient Active Problem List   Diagnosis Date Noted  . Neoplasm of scalp 12/08/2013  . Rhinitis 12/08/2013  . Cough 12/08/2013  . Hyperlipidemia   . Glaucoma   . Cancer   . Arthritis   . Hypertension 02/04/2011  . Degenerative disc disease 02/04/2011  . Gastrointestinal bleed 02/04/2011  . Anemia 02/04/2011  . Prostate cancer 02/04/2011   Outpatient Encounter Prescriptions as of 05/08/2015  Medication Sig  . atorvastatin (LIPITOR) 20 MG tablet Take 1 tab qod(HS)  . bimatoprost (LUMIGAN) 0.03 % ophthalmic solution Place 1 drop into both eyes at bedtime.  . brimonidine (ALPHAGAN) 0.2 % ophthalmic solution Place 1 drop into both eyes daily.  . COMBIGAN 0.2-0.5 % ophthalmic solution   . Enalapril-Hydrochlorothiazide 5-12.5 MG per tablet Take 1 tablet by mouth daily.  . felodipine (PLENDIL) 5 MG 24 hr tablet Take 1 tablet (5 mg total) by mouth daily.  Marland Kitchen ibuprofen (ADVIL,MOTRIN) 200 MG tablet Take 200 mg by mouth every 6 (six) hours as needed for pain.  Marland Kitchen timolol (TIMOPTIC) 0.5 % ophthalmic solution    No facility-administered encounter medications on file as of 05/08/2015.       Review of Systems  Constitutional: Negative.   Respiratory: Negative.   Cardiovascular: Negative.   Gastrointestinal: Negative.   Musculoskeletal: Positive for myalgias.  Psychiatric/Behavioral: Negative.        Objective:   Physical Exam  Constitutional: He is oriented to person, place, and time. He appears well-developed and well-nourished.  Cardiovascular: Normal  rate and regular rhythm.   Pulmonary/Chest: Effort normal and breath sounds normal.  Abdominal: Soft. There is no tenderness.  Musculoskeletal: Normal range of motion.  Neurological: He is alert and oriented to person, place, and time.    BP 111/68 mmHg  Pulse 62  Temp(Src) 98.4 F (36.9 C) (Oral)  Ht 5' 11.5" (1.816 m)  Wt 204 lb (92.534 kg)  BMI 28.06 kg/m2       Assessment & Plan:   1. Essential hypertension Blood pressure is well controlled on current regimen and he should continue same  2. Hyperlipidemia His surgeon for statin that he can take I have given him samples of Livalo.  He will call back after 2 weeks and let me know how that's tolerated if this does not work I would try Zetia or possibly cholestyramine  Wardell Honour MD

## 2015-05-08 NOTE — Patient Instructions (Signed)
Appt with Dr Jeffie Pollock in the [place] office if 07/06/15 at 8:15. [facility] [place], [address] [phone]

## 2015-07-06 ENCOUNTER — Ambulatory Visit (INDEPENDENT_AMBULATORY_CARE_PROVIDER_SITE_OTHER): Payer: Medicare Other | Admitting: Urology

## 2015-07-06 DIAGNOSIS — C61 Malignant neoplasm of prostate: Secondary | ICD-10-CM | POA: Diagnosis not present

## 2015-07-06 DIAGNOSIS — Z8546 Personal history of malignant neoplasm of prostate: Secondary | ICD-10-CM

## 2015-07-06 DIAGNOSIS — N5201 Erectile dysfunction due to arterial insufficiency: Secondary | ICD-10-CM | POA: Diagnosis not present

## 2015-07-06 DIAGNOSIS — R312 Other microscopic hematuria: Secondary | ICD-10-CM

## 2015-07-06 DIAGNOSIS — R3912 Poor urinary stream: Secondary | ICD-10-CM

## 2015-07-06 DIAGNOSIS — N401 Enlarged prostate with lower urinary tract symptoms: Secondary | ICD-10-CM

## 2015-07-10 DIAGNOSIS — H2513 Age-related nuclear cataract, bilateral: Secondary | ICD-10-CM | POA: Diagnosis not present

## 2015-07-10 DIAGNOSIS — H4011X3 Primary open-angle glaucoma, severe stage: Secondary | ICD-10-CM | POA: Diagnosis not present

## 2015-07-10 DIAGNOSIS — H47232 Glaucomatous optic atrophy, left eye: Secondary | ICD-10-CM | POA: Diagnosis not present

## 2015-07-10 DIAGNOSIS — H04123 Dry eye syndrome of bilateral lacrimal glands: Secondary | ICD-10-CM | POA: Diagnosis not present

## 2015-08-07 DIAGNOSIS — H4011X3 Primary open-angle glaucoma, severe stage: Secondary | ICD-10-CM | POA: Diagnosis not present

## 2015-08-09 ENCOUNTER — Other Ambulatory Visit: Payer: Self-pay | Admitting: Family Medicine

## 2015-08-16 ENCOUNTER — Encounter: Payer: Self-pay | Admitting: Family Medicine

## 2015-08-16 ENCOUNTER — Ambulatory Visit (INDEPENDENT_AMBULATORY_CARE_PROVIDER_SITE_OTHER): Payer: Medicare Other | Admitting: Family Medicine

## 2015-08-16 VITALS — BP 115/70 | HR 58 | Temp 97.1°F | Ht 71.5 in | Wt 206.0 lb

## 2015-08-16 DIAGNOSIS — E785 Hyperlipidemia, unspecified: Secondary | ICD-10-CM

## 2015-08-16 DIAGNOSIS — I10 Essential (primary) hypertension: Secondary | ICD-10-CM

## 2015-08-16 MED ORDER — LORAZEPAM 1 MG PO TABS: 1.0000 mg | ORAL_TABLET | Freq: Every day | ORAL | Status: DC

## 2015-08-16 MED ORDER — FELODIPINE ER 5 MG PO TB24: 5.0000 mg | ORAL_TABLET | Freq: Every day | ORAL | Status: DC

## 2015-08-16 NOTE — Patient Instructions (Signed)
Medicare Annual Wellness Visit  [place] and the medical providers at [facility] strive to bring you the best medical care.  In doing so we not only want to address your current medical conditions and concerns but also to detect new conditions early and prevent illness, disease and health-related problems.    Medicare offers a yearly Wellness Visit which allows our clinical staff to assess your need for preventative services including immunizations, lifestyle education, counseling to decrease risk of preventable diseases and screening for fall risk and other medical concerns.    This visit is provided free of charge (no copay) for all Medicare recipients. The clinical pharmacists at [facility] have begun to conduct these Wellness Visits which will also include a thorough review of all your medications.    As you primary medical provider recommend that you make an appointment for your Annual Wellness Visit if you have not done so already this year.  You may set up this appointment before you leave today or you may call back ([phone]) and schedule an appointment.  Please make sure when you call that you mention that you are scheduling your Annual Wellness Visit with the clinical pharmacist so that the appointment may be made for the proper length of time.     Continue current medications. Continue good therapeutic lifestyle changes which include good diet and exercise. Fall precautions discussed with patient. If an FOBT was given today- please return it to our front desk. If you are over 50 years old - you may need Prevnar 13 or the adult Pneumonia vaccine.  **Flu shots will be available soon--- please call and schedule a FLU-CLINIC appointment**  After your visit with us today you will receive a survey in the mail or online from [employer] regarding your care with us. Please take a moment to fill this out. Your feedback is  very important to us as you can help us better understand your patient needs as well as improve your experience and satisfaction. WE CARE ABOUT YOU!!!   **Please join us SEPT.[email or web address], 2016 from 5:00 to 7:00pm for our OPEN HOUSE! Come out and meet our NEW providers**  

## 2015-08-16 NOTE — Progress Notes (Signed)
Subjective:    Patient ID: Joshua Compton, male    DOB: [birth year], 74 y.o.   MRN: [id number]  HPI Pt here for follow up and management of chronic medical problems which includes hypertension and hyperlipidemia. He is taking medication regularly.  He is not taking medicine for cholesterol as he had some myalgias. Had given him samples of a new statin but does not cause muscle aches but he stopped taking after he ran out of the sample. He probably should be on cholesterol medicine with LDL of almost 170. Blood pressure is doing good today 115/70. He takes Plendil. His chief complaint today is trouble sleeping. By history he goes to sleep but wakes up and then cannot go back to sleep. He admits to some naps in the daytime and I cautioned him about napping. He formerly had some sort of sleeping pill or aid that he really felt helped and he says he would like that again.      Patient Active Problem List   Diagnosis Date Noted  . Neoplasm of scalp 12/08/2013  . Rhinitis 12/08/2013  . Cough 12/08/2013  . Hyperlipidemia   . Glaucoma   . Cancer   . Arthritis   . Hypertension 02/04/2011  . Degenerative disc disease 02/04/2011  . Gastrointestinal bleed 02/04/2011  . Anemia 02/04/2011  . Prostate cancer 02/04/2011   Outpatient Encounter Prescriptions as of 08/16/2015  Medication Sig  . atorvastatin (LIPITOR) 20 MG tablet Take 1 tab qod(HS)  . bimatoprost (LUMIGAN) 0.03 % ophthalmic solution Place 1 drop into both eyes at bedtime.  . brimonidine (ALPHAGAN) 0.2 % ophthalmic solution Place 1 drop into both eyes daily.  . COMBIGAN 0.2-0.5 % ophthalmic solution   . felodipine (PLENDIL) 5 MG 24 hr tablet Take 1 tablet (5 mg total) by mouth daily.  Marland Kitchen ibuprofen (ADVIL,MOTRIN) 200 MG tablet Take 200 mg by mouth every 6 (six) hours as needed for pain.  Marland Kitchen timolol (TIMOPTIC) 0.5 % ophthalmic solution   . [DISCONTINUED] Enalapril-Hydrochlorothiazide 5-12.5 MG per tablet TAKE ONE TABLET BY MOUTH ONE  TIME DAILY  . [DISCONTINUED] felodipine (PLENDIL) 5 MG 24 hr tablet Take 1 tablet (5 mg total) by mouth daily.   No facility-administered encounter medications on file as of 08/16/2015.     Review of Systems  Constitutional: Negative.        Trouble sleeping  HENT: Negative.   Eyes: Negative.   Respiratory: Negative.   Cardiovascular: Negative.   Gastrointestinal: Negative.   Endocrine: Negative.   Genitourinary: Negative.   Musculoskeletal: Negative.   Skin: Negative.   Allergic/Immunologic: Negative.   Neurological: Negative.   Hematological: Negative.   Psychiatric/Behavioral: Negative.        Objective:   Physical Exam  Constitutional: He is oriented to person, place, and time. He appears well-developed and well-nourished.  Cardiovascular: Normal rate.   Pulmonary/Chest: Effort normal and breath sounds normal.  Neurological: He is alert and oriented to person, place, and time.  Psychiatric: He has a normal mood and affect. Thought content normal.    BP 115/70 mmHg  Pulse 58  Temp(Src) 97.1 F (36.2 C) (Oral)  Ht 5' 11.5" (1.816 m)  Wt 206 lb (93.441 kg)  BMI 28.33 kg/m2       Assessment & Plan:  1. Essential hypertension Blood pressure is well controlled with current medicine  2. Hyperlipidemia Soft medicine but if LDL is still as elevated as before would like to consider Livalo - Lipid panel Wardell Honour MD

## 2015-08-17 LAB — LIPID PANEL
CHOL/HDL RATIO: 4.3 ratio (ref 0.0–5.0)
CHOLESTEROL TOTAL: 220 mg/dL — AB (ref 100–199)
HDL: 51 mg/dL (ref >39)
LDL CALC: 145 mg/dL — AB (ref 0–99)
TRIGLYCERIDES: 122 mg/dL (ref 0–149)
VLDL Cholesterol Cal: 24 mg/dL (ref 5–40)

## 2015-10-23 DIAGNOSIS — H401133 Primary open-angle glaucoma, bilateral, severe stage: Secondary | ICD-10-CM | POA: Diagnosis not present

## 2015-11-08 ENCOUNTER — Other Ambulatory Visit: Payer: Self-pay | Admitting: Family Medicine

## 2015-11-16 ENCOUNTER — Telehealth: Payer: Self-pay | Admitting: Family Medicine

## 2015-11-16 NOTE — Telephone Encounter (Signed)
Pt advised to try otw delsym or robitussin for cough that pt would need to be seen for the cough. Pt also advised that he needed to go to the urgent care if he needed to be seen or it gets worse.

## 2015-11-20 ENCOUNTER — Ambulatory Visit (INDEPENDENT_AMBULATORY_CARE_PROVIDER_SITE_OTHER): Payer: Medicare Other | Admitting: Nurse Practitioner

## 2015-11-20 VITALS — BP 141/75 | HR 56 | Temp 97.8°F | Ht 71.0 in | Wt 207.0 lb

## 2015-11-20 DIAGNOSIS — J208 Acute bronchitis due to other specified organisms: Secondary | ICD-10-CM | POA: Diagnosis not present

## 2015-11-20 MED ORDER — HYDROCODONE-HOMATROPINE 5-1.5 MG/5ML PO SYRP: 5.0000 mL | ORAL_SOLUTION | Freq: Four times a day (QID) | ORAL | Status: DC | PRN

## 2015-11-20 MED ORDER — METHYLPREDNISOLONE ACETATE 80 MG/ML IJ SUSP
80.0000 mg | Freq: Once | INTRAMUSCULAR | Status: AC
  Administered 2015-11-20: 80 mg via INTRAMUSCULAR

## 2015-11-20 NOTE — Progress Notes (Signed)
  Subjective:     Joshua Compton is a 75 y.o. male here for evaluation of a cough. Onset of symptoms was 5 days ago. Symptoms have been gradually worsening since that time. The cough is barky, hoarse and productive and is aggravated by nothing. Associated symptoms include: chills, fever, shortness of breath and sputum production. Patient does not have a history of asthma. Patient does not have a history of environmental allergens. Patient has not traveled recently. Patient does not have a history of smoking. Patient has had a previous chest x-ray. Patient has not had a PPD done.  The following portions of the patient's history were reviewed and updated as appropriate: allergies, current medications, past family history, past medical history, past social history, past surgical history and problem list.  Review of Systems Pertinent items are noted in HPI.    Objective:     BP 141/75 mmHg  Pulse 56  Temp(Src) 97.8 F (36.6 C) (Oral)  Ht 5\' 11"  (1.803 m)  Wt 207 lb (93.895 kg)  BMI 28.88 kg/m2 General appearance: alert and cooperative Eyes: conjunctivae/corneas clear. PERRL, EOM's intact. Fundi benign. Ears: normal TM's and external ear canals both ears Nose: clear discharge, moderate congestion, turbinates red Neck: no adenopathy, no carotid bruit, no JVD, supple, symmetrical, trachea midline and thyroid not enlarged, symmetric, no tenderness/mass/nodules Lungs: clear to auscultation bilaterally and deep dry cough Heart: regular rate and rhythm, S1, S2 normal, no murmur, click, rub or gallop    Assessment:    Acute Bronchitis    Plan:   1. Take meds as prescribed 2. Use a cool mist humidifier especially during the winter months and when heat has been humid. 3. Use saline nose sprays frequently 4. Saline irrigations of the nose can be very helpful if done frequently.  * 4X daily for 1 week*  * Use of a nettie pot can be helpful with this. Follow directions with this* 5. Drink  plenty of fluids 6. Keep thermostat turn down low 7.For any cough or congestion  Use plain Mucinex- regular strength or max strength is fine   * Children- consult with Pharmacist for dosing 8. For fever or aces or pains- take tylenol or ibuprofen appropriate for age and weight.  * for fevers greater than 101 orally you may alternate ibuprofen and tylenol every  3 hours.   Meds ordered this encounter  Medications  . HYDROcodone-homatropine (HYCODAN) 5-1.5 MG/5ML syrup    Sig: Take 5 mLs by mouth every 6 (six) hours as needed for cough.    Dispense:  120 mL    Refill:  0    Order Specific Question:  Supervising Provider    Answer:  Chipper Herb [[id number]]  . methylPREDNISolone acetate (DEPO-MEDROL) injection 80 mg    Sig:    Mary-Margaret Hassell Done, FNP

## 2015-11-20 NOTE — Patient Instructions (Signed)

## 2015-11-22 ENCOUNTER — Encounter: Payer: Self-pay | Admitting: Family Medicine

## 2015-11-22 ENCOUNTER — Ambulatory Visit (INDEPENDENT_AMBULATORY_CARE_PROVIDER_SITE_OTHER): Payer: Medicare Other | Admitting: Family Medicine

## 2015-11-22 VITALS — BP 137/84 | HR 64 | Temp 98.3°F | Ht 71.0 in | Wt 206.0 lb

## 2015-11-22 DIAGNOSIS — I10 Essential (primary) hypertension: Secondary | ICD-10-CM | POA: Diagnosis not present

## 2015-11-22 NOTE — Progress Notes (Signed)
   Subjective:    Patient ID: Joshua Compton, male    DOB: Apr 30, 1941, 75 y.o.   MRN: [id number]  HPI 75 year old gentleman here to follow-up prostate cancer, hypertension, and more recent cough and congestion for the past 2 weeks. He was seen in this office 2 days ago and given injection and some cough medicine and feels like he may be improving some cough is productive of white or clear sputum. There is some head congestion and he has not heard any wheezing or experienced any shortness of breath.  Patient Active Problem List   Diagnosis Date Noted  . Neoplasm of scalp 12/08/2013  . Rhinitis 12/08/2013  . Cough 12/08/2013  . Hyperlipidemia   . Glaucoma   . Cancer ([place])   . Arthritis   . Hypertension 02/04/2011  . Degenerative disc disease 02/04/2011  . Gastrointestinal bleed 02/04/2011  . Anemia 02/04/2011  . Prostate cancer ([place]) 02/04/2011   Outpatient Encounter Prescriptions as of 11/22/2015  Medication Sig  . bimatoprost (LUMIGAN) 0.03 % ophthalmic solution Place 1 drop into both eyes at bedtime.  . brimonidine (ALPHAGAN) 0.2 % ophthalmic solution Place 1 drop into both eyes daily.  . COMBIGAN 0.2-0.5 % ophthalmic solution   . Enalapril-Hydrochlorothiazide 5-12.5 MG tablet TAKE ONE TABLET BY MOUTH ONE  TIME DAILY  . HYDROcodone-homatropine (HYCODAN) 5-1.5 MG/5ML syrup Take 5 mLs by mouth every 6 (six) hours as needed for cough.  Marland Kitchen ibuprofen (ADVIL,MOTRIN) 200 MG tablet Take 200 mg by mouth every 6 (six) hours as needed for pain.  Marland Kitchen timolol (TIMOPTIC) 0.5 % ophthalmic solution   . atorvastatin (LIPITOR) 20 MG tablet Take 1 tab qod(HS) (Patient not taking: Reported on 11/22/2015)   No facility-administered encounter medications on file as of 11/22/2015.      Review of Systems  Constitutional: Negative.   Respiratory: Positive for cough.   Cardiovascular: Negative.   Genitourinary: Negative.   Neurological: Negative.        Objective:   Physical Exam  Constitutional: He  is oriented to person, place, and time. He appears well-developed and well-nourished.  HENT:  Head: Normocephalic.  Mouth/Throat: Oropharynx is clear and moist.  Cardiovascular: Normal rate and regular rhythm.   Pulmonary/Chest: Effort normal.  Neurological: He is alert and oriented to person, place, and time.          Assessment & Plan:  1. Essential hypertension Blood pressure is well controlled on current regimen of felodipine and enalapril. Will continue same line may check his lipids when he returns in 4 months. LDL was not quite at goal when last checked in September 2016   2 prostate cancer This is followed by urology. He has been seen in the past 3 or 4 months. He is checked yearly mammogram and his last PSA was barely detectable.  Wardell Honour MD

## 2016-02-14 ENCOUNTER — Other Ambulatory Visit: Payer: Self-pay | Admitting: *Deleted

## 2016-02-14 MED ORDER — FELODIPINE ER 5 MG PO TB24: 5.0000 mg | ORAL_TABLET | Freq: Every day | ORAL | Status: DC

## 2016-02-14 MED ORDER — ENALAPRIL-HYDROCHLOROTHIAZIDE 5-12.5 MG PO TABS: ORAL_TABLET | ORAL | Status: DC

## 2016-03-27 ENCOUNTER — Ambulatory Visit (INDEPENDENT_AMBULATORY_CARE_PROVIDER_SITE_OTHER): Payer: Medicare Other | Admitting: Family Medicine

## 2016-03-27 ENCOUNTER — Encounter: Payer: Self-pay | Admitting: Family Medicine

## 2016-03-27 VITALS — BP 150/81 | HR 68 | Temp 98.1°F | Ht 71.0 in | Wt 203.0 lb

## 2016-03-27 DIAGNOSIS — E785 Hyperlipidemia, unspecified: Secondary | ICD-10-CM | POA: Diagnosis not present

## 2016-03-27 DIAGNOSIS — I10 Essential (primary) hypertension: Secondary | ICD-10-CM

## 2016-03-27 NOTE — Progress Notes (Signed)
   Subjective:    Patient ID: Joshua Compton, male    DOB: Apr 10, 1941, 75 y.o.   MRN: [id number]  HPI Pt here for follow up and management of chronic medical problems which includes hypertension and hyperlipidemia. He is taking medications regularly. Blood pressure is up some today but he was out fishing last night at 2:00aM.  He sees urologist once a year for follow-up prostate cancer. He has not had any symptoms such as incontinence. He had brachytherapy. There are no other complaints or symptoms today     Patient Active Problem List   Diagnosis Date Noted  . Neoplasm of scalp 12/08/2013  . Rhinitis 12/08/2013  . Cough 12/08/2013  . Hyperlipidemia   . Glaucoma   . Cancer ([place])   . Arthritis   . Hypertension 02/04/2011  . Degenerative disc disease 02/04/2011  . Gastrointestinal bleed 02/04/2011  . Anemia 02/04/2011  . Prostate cancer ([place]) 02/04/2011   Outpatient Encounter Prescriptions as of 03/27/2016  Medication Sig  . bimatoprost (LUMIGAN) 0.03 % ophthalmic solution Place 1 drop into both eyes at bedtime.  . brimonidine (ALPHAGAN) 0.2 % ophthalmic solution Place 1 drop into both eyes daily.  . COMBIGAN 0.2-0.5 % ophthalmic solution   . Enalapril-Hydrochlorothiazide 5-12.5 MG tablet TAKE ONE TABLET BY MOUTH ONE  TIME DAILY  . felodipine (PLENDIL) 5 MG 24 hr tablet Take 1 tablet (5 mg total) by mouth daily.  Marland Kitchen ibuprofen (ADVIL,MOTRIN) 200 MG tablet Take 200 mg by mouth every 6 (six) hours as needed for pain.  Marland Kitchen timolol (TIMOPTIC) 0.5 % ophthalmic solution   . [DISCONTINUED] atorvastatin (LIPITOR) 20 MG tablet Take 1 tab qod(HS) (Patient not taking: Reported on 11/22/2015)  . [DISCONTINUED] HYDROcodone-homatropine (HYCODAN) 5-1.5 MG/5ML syrup Take 5 mLs by mouth every 6 (six) hours as needed for cough.   No facility-administered encounter medications on file as of 03/27/2016.     Review of Systems  Constitutional: Negative.   HENT: Negative.   Eyes: Negative.     Respiratory: Negative.   Cardiovascular: Negative.   Gastrointestinal: Negative.   Endocrine: Negative.   Genitourinary: Negative.   Musculoskeletal: Negative.   Skin: Negative.   Allergic/Immunologic: Negative.   Neurological: Negative.   Hematological: Negative.   Psychiatric/Behavioral: Negative.        Objective:   Physical Exam  Constitutional: He is oriented to person, place, and time. He appears well-developed and well-nourished.  HENT:  Head: Normocephalic.  Cardiovascular: Normal rate, regular rhythm, normal heart sounds and intact distal pulses.   Pulmonary/Chest: Effort normal and breath sounds normal.  Musculoskeletal: Normal range of motion.  Neurological: He is alert and oriented to person, place, and time.    BP 150/81 mmHg  Pulse 68  Temp(Src) 98.1 F (36.7 C) (Oral)  Ht '5\' 11"'$  (1.803 m)  Wt 203 lb (92.08 kg)  BMI 28.33 kg/m2       Assessment & Plan:  1. Essential hypertension Sugars usually well controlled on current regimen. Continue same - CMP14+EGFR  2. Hyperlipidemia Patient patient could not tolerate statins has been off for some time if LDL has gone back up consider twice-weekly atorvastatin  Wardell Honour MD - Lipid panel

## 2016-03-27 NOTE — Patient Instructions (Signed)
Medicare Annual Wellness Visit  [place] and the medical providers at [facility] strive to bring you the best medical care.  In doing so we not only want to address your current medical conditions and concerns but also to detect new conditions early and prevent illness, disease and health-related problems.    Medicare offers a yearly Wellness Visit which allows our clinical staff to assess your need for preventative services including immunizations, lifestyle education, counseling to decrease risk of preventable diseases and screening for fall risk and other medical concerns.    This visit is provided free of charge (no copay) for all Medicare recipients. The clinical pharmacists at [facility] have begun to conduct these Wellness Visits which will also include a thorough review of all your medications.    As you primary medical provider recommend that you make an appointment for your Annual Wellness Visit if you have not done so already this year.  You may set up this appointment before you leave today or you may call back ([phone]) and schedule an appointment.  Please make sure when you call that you mention that you are scheduling your Annual Wellness Visit with the clinical pharmacist so that the appointment may be made for the proper length of time.     Continue current medications. Continue good therapeutic lifestyle changes which include good diet and exercise. Fall precautions discussed with patient. If an FOBT was given today- please return it to our front desk. If you are over 50 years old - you may need Prevnar 13 or the adult Pneumonia vaccine.  **Flu shots are available--- please call and schedule a FLU-CLINIC appointment**  After your visit with us today you will receive a survey in the mail or online from [employer] regarding your care with us. Please take a moment to fill this out. Your feedback is very  important to us as you can help us better understand your patient needs as well as improve your experience and satisfaction. WE CARE ABOUT YOU!!!    

## 2016-03-28 LAB — CMP14+EGFR
ALBUMIN: 4.4 g/dL (ref 3.5–4.8)
ALK PHOS: 95 IU/L (ref 39–117)
ALT: 17 IU/L (ref 0–44)
AST: 17 IU/L (ref 0–40)
Albumin/Globulin Ratio: 1.6 (ref 1.2–2.2)
BILIRUBIN TOTAL: 0.3 mg/dL (ref 0.0–1.2)
BUN / CREAT RATIO: 18 (ref 10–24)
BUN: 21 mg/dL (ref 8–27)
CHLORIDE: 99 mmol/L (ref 96–106)
CO2: 23 mmol/L (ref 18–29)
Calcium: 9.9 mg/dL (ref 8.6–10.2)
Creatinine, Ser: 1.15 mg/dL (ref 0.76–1.27)
GFR calc Af Amer: 72 mL/min/{1.73_m2} (ref >59)
GFR calc non Af Amer: 62 mL/min/{1.73_m2} (ref >59)
GLUCOSE: 121 mg/dL — AB (ref 65–99)
Globulin, Total: 2.8 g/dL (ref 1.5–4.5)
Potassium: 4 mmol/L (ref 3.5–5.2)
Sodium: 140 mmol/L (ref 134–144)
Total Protein: 7.2 g/dL (ref 6.0–8.5)

## 2016-03-28 LAB — LIPID PANEL
CHOL/HDL RATIO: 4 ratio (ref 0.0–5.0)
Cholesterol, Total: 215 mg/dL — ABNORMAL HIGH (ref 100–199)
HDL: 54 mg/dL (ref >39)
LDL CALC: 133 mg/dL — AB (ref 0–99)
Triglycerides: 138 mg/dL (ref 0–149)
VLDL CHOLESTEROL CAL: 28 mg/dL (ref 5–40)

## 2016-05-12 ENCOUNTER — Other Ambulatory Visit: Payer: Self-pay | Admitting: Family Medicine

## 2016-06-12 DIAGNOSIS — H401123 Primary open-angle glaucoma, left eye, severe stage: Secondary | ICD-10-CM | POA: Diagnosis not present

## 2016-06-12 DIAGNOSIS — H40052 Ocular hypertension, left eye: Secondary | ICD-10-CM | POA: Diagnosis not present

## 2016-06-12 DIAGNOSIS — H401113 Primary open-angle glaucoma, right eye, severe stage: Secondary | ICD-10-CM | POA: Diagnosis not present

## 2016-07-11 ENCOUNTER — Ambulatory Visit (INDEPENDENT_AMBULATORY_CARE_PROVIDER_SITE_OTHER): Payer: Medicare Other | Admitting: Urology

## 2016-07-11 ENCOUNTER — Other Ambulatory Visit (HOSPITAL_COMMUNITY)
Admission: AD | Admit: 2016-07-11 | Discharge: 2016-07-11 | Disposition: A | Discharge status: Home / Self Care | Payer: Medicare Other | Source: Other Acute Inpatient Hospital | Attending: Urology | Admitting: Urology

## 2016-07-11 DIAGNOSIS — R319 Hematuria, unspecified: Secondary | ICD-10-CM | POA: Insufficient documentation

## 2016-07-11 DIAGNOSIS — R3121 Asymptomatic microscopic hematuria: Secondary | ICD-10-CM

## 2016-07-11 DIAGNOSIS — Z8546 Personal history of malignant neoplasm of prostate: Secondary | ICD-10-CM | POA: Diagnosis not present

## 2016-07-11 DIAGNOSIS — N5201 Erectile dysfunction due to arterial insufficiency: Secondary | ICD-10-CM

## 2016-07-11 LAB — URINALYSIS, ROUTINE W REFLEX MICROSCOPIC
BILIRUBIN URINE: NEGATIVE
Glucose, UA: NEGATIVE mg/dL
Ketones, ur: NEGATIVE mg/dL
LEUKOCYTES UA: NEGATIVE
NITRITE: NEGATIVE
PH: 5 (ref 5.0–8.0)
Protein, ur: NEGATIVE mg/dL

## 2016-07-11 LAB — URINE MICROSCOPIC-ADD ON

## 2016-07-23 ENCOUNTER — Encounter: Payer: Self-pay | Admitting: Family Medicine

## 2016-07-23 ENCOUNTER — Ambulatory Visit (INDEPENDENT_AMBULATORY_CARE_PROVIDER_SITE_OTHER): Payer: Medicare Other | Admitting: Family Medicine

## 2016-07-23 VITALS — BP 126/65 | HR 56 | Temp 97.7°F | Ht 71.0 in | Wt 203.0 lb

## 2016-07-23 DIAGNOSIS — I10 Essential (primary) hypertension: Secondary | ICD-10-CM

## 2016-07-23 DIAGNOSIS — E785 Hyperlipidemia, unspecified: Secondary | ICD-10-CM

## 2016-07-23 MED ORDER — ENALAPRIL-HYDROCHLOROTHIAZIDE 5-12.5 MG PO TABS: 1.0000 | ORAL_TABLET | Freq: Every day | ORAL | 3 refills | Status: DC

## 2016-07-23 NOTE — Patient Instructions (Signed)
Medicare Annual Wellness Visit  [place] and the medical providers at [facility] strive to bring you the best medical care.  In doing so we not only want to address your current medical conditions and concerns but also to detect new conditions early and prevent illness, disease and health-related problems.    Medicare offers a yearly Wellness Visit which allows our clinical staff to assess your need for preventative services including immunizations, lifestyle education, counseling to decrease risk of preventable diseases and screening for fall risk and other medical concerns.    This visit is provided free of charge (no copay) for all Medicare recipients. The clinical pharmacists at [facility] have begun to conduct these Wellness Visits which will also include a thorough review of all your medications.    As you primary medical provider recommend that you make an appointment for your Annual Wellness Visit if you have not done so already this year.  You may set up this appointment before you leave today or you may call back ([phone]) and schedule an appointment.  Please make sure when you call that you mention that you are scheduling your Annual Wellness Visit with the clinical pharmacist so that the appointment may be made for the proper length of time.     Continue current medications. Continue good therapeutic lifestyle changes which include good diet and exercise. Fall precautions discussed with patient. If an FOBT was given today- please return it to our front desk. If you are over 50 years old - you may need Prevnar 13 or the adult Pneumonia vaccine.  After your visit with us today you will receive a survey in the mail or online from [employer] regarding your care with us. Please take a moment to fill this out. Your feedback is very important to us as you can help us better understand your patient needs as well as improve  your experience and satisfaction. WE CARE ABOUT YOU!!!    

## 2016-07-23 NOTE — Progress Notes (Signed)
   Subjective:    Patient ID: Joshua Compton, male    DOB: 01-12-1941, 75 y.o.   MRN: [id number]  HPI Pt here for follow up and management of chronic medical problems which includes hypertension and hyperlipidemia. He is taking medications regularly.  He saw his urologist last week and PSA is good 0.1. He has no aches or other complaints today. There are no side effects or issues with his medications. In addition to his blood pressure pills he has some eyedrops but complains about the cost of those so I'm not sure which ones he is actually taking. Labs were checked 4 months ago and were all in order. Cholesterol is borderline elevated. He had previously tried a cholesterol pill but was intolerant of statin.   Patient Active Problem List   Diagnosis Date Noted  . Neoplasm of scalp 12/08/2013  . Rhinitis 12/08/2013  . Cough 12/08/2013  . Hyperlipidemia   . Glaucoma   . Cancer ([place])   . Arthritis   . Hypertension 02/04/2011  . Degenerative disc disease 02/04/2011  . Gastrointestinal bleed 02/04/2011  . Anemia 02/04/2011  . Prostate cancer ([place]) 02/04/2011   Outpatient Encounter Prescriptions as of 07/23/2016  Medication Sig  . bimatoprost (LUMIGAN) 0.03 % ophthalmic solution Place 1 drop into both eyes at bedtime.  . brimonidine (ALPHAGAN) 0.2 % ophthalmic solution Place 1 drop into both eyes daily.  . COMBIGAN 0.2-0.5 % ophthalmic solution   . Enalapril-Hydrochlorothiazide 5-12.5 MG tablet TAKE ONE TABLET BY MOUTH ONE TIME DAILY  . felodipine (PLENDIL) 5 MG 24 hr tablet TAKE 1 TABLET DAILY  . ibuprofen (ADVIL,MOTRIN) 200 MG tablet Take 200 mg by mouth every 6 (six) hours as needed for pain.  Marland Kitchen timolol (TIMOPTIC) 0.5 % ophthalmic solution    No facility-administered encounter medications on file as of 07/23/2016.       Review of Systems  Constitutional: Negative.   HENT: Negative.   Eyes: Negative.   Respiratory: Negative.   Cardiovascular: Negative.   Gastrointestinal:  Negative.   Endocrine: Negative.   Genitourinary: Negative.   Musculoskeletal: Negative.   Skin: Negative.   Allergic/Immunologic: Negative.   Neurological: Negative.   Hematological: Negative.   Psychiatric/Behavioral: Negative.        Objective:   Physical Exam  Constitutional: He is oriented to person, place, and time. He appears well-developed and well-nourished.  Cardiovascular: Normal rate, regular rhythm and normal heart sounds.   Pulmonary/Chest: Effort normal and breath sounds normal.  Neurological: He is alert and oriented to person, place, and time.  Psychiatric: He has a normal mood and affect. His behavior is normal.   BP 126/65 (BP Location: Left Arm)   Pulse (!) 56   Temp 97.7 F (36.5 C) (Oral)   Ht 5\' 11"  (1.803 m)   Wt 203 lb (92.1 kg)   BMI 28.31 kg/m         Assessment & Plan:  1. Essential hypertension Continue enalapril hydrochlorothiazide and Plendil  2. Hyperlipidemia We'll follow lipids LDL at 133 is borderline elevated he has a sufficient level of cholesterol.  Wardell Honour MD

## 2016-08-28 ENCOUNTER — Ambulatory Visit (INDEPENDENT_AMBULATORY_CARE_PROVIDER_SITE_OTHER): Payer: Medicare Other | Admitting: Family Medicine

## 2016-08-28 ENCOUNTER — Encounter: Payer: Self-pay | Admitting: Family Medicine

## 2016-08-28 VITALS — BP 110/66 | HR 77 | Temp 98.6°F | Ht 71.0 in | Wt 201.0 lb

## 2016-08-28 DIAGNOSIS — J0100 Acute maxillary sinusitis, unspecified: Secondary | ICD-10-CM

## 2016-08-28 MED ORDER — AZITHROMYCIN 250 MG PO TABS: ORAL_TABLET | ORAL | 0 refills | Status: DC

## 2016-08-28 MED ORDER — HYDROCODONE-HOMATROPINE 5-1.5 MG/5ML PO SYRP: 5.0000 mL | ORAL_SOLUTION | Freq: Three times a day (TID) | ORAL | 0 refills | Status: DC | PRN

## 2016-08-28 NOTE — Progress Notes (Signed)
   Subjective:    Patient ID: Joshua Compton, male    DOB: 12/06/1940, 75 y.o.   MRN: [id number]  HPI  75 year old gentleman whose had cough almost continually and congestion for the past 3-4 days. He denies any fever or chills. He is a nonsmoker. Sputum is described as yellow.  Patient Active Problem List   Diagnosis Date Noted  . Neoplasm of scalp 12/08/2013  . Rhinitis 12/08/2013  . Cough 12/08/2013  . Hyperlipidemia   . Glaucoma   . Cancer ([place])   . Arthritis   . Hypertension 02/04/2011  . Degenerative disc disease 02/04/2011  . Gastrointestinal bleed 02/04/2011  . Anemia 02/04/2011  . Prostate cancer ([place]) 02/04/2011   Outpatient Encounter Prescriptions as of 08/28/2016  Medication Sig  . bimatoprost (LUMIGAN) 0.03 % ophthalmic solution Place 1 drop into both eyes at bedtime.  . brimonidine (ALPHAGAN) 0.2 % ophthalmic solution Place 1 drop into both eyes daily.  . COMBIGAN 0.2-0.5 % ophthalmic solution   . dorzolamide-timolol (COSOPT) 22.3-6.8 MG/ML ophthalmic solution   . Enalapril-Hydrochlorothiazide 5-12.5 MG tablet Take 1 tablet by mouth daily.  . felodipine (PLENDIL) 5 MG 24 hr tablet TAKE 1 TABLET DAILY  . ibuprofen (ADVIL,MOTRIN) 200 MG tablet Take 200 mg by mouth every 6 (six) hours as needed for pain.  Marland Kitchen timolol (TIMOPTIC) 0.5 % ophthalmic solution    No facility-administered encounter medications on file as of 08/28/2016.       Review of Systems  Constitutional: Negative.   HENT: Positive for congestion and postnasal drip.   Respiratory: Positive for cough.   Cardiovascular: Negative.        Objective:   Physical Exam  Constitutional: He is oriented to person, place, and time. He appears well-developed and well-nourished.  HENT:  Mouth/Throat: Oropharynx is clear and moist.  Eyes: Pupils are equal, round, and reactive to light.  Cardiovascular: Normal rate and regular rhythm.   Pulmonary/Chest: Effort normal and breath sounds normal.    Neurological: He is alert and oriented to person, place, and time.   BP 110/66   Pulse 77   Temp 98.6 F (37 C) (Oral)   Ht 5\' 11"  (1.803 m)   Wt 201 lb (91.2 kg)   BMI 28.03 kg/m         Assessment & Plan:  1. Acute maxillary sinusitis, recurrence not specified We will treat with Z-Pak, Mucinex, and Hycodan to take at bedtime for cough suppression. Also encouraged to drink plenty of fluids.  Wardell Honour MD

## 2016-09-09 ENCOUNTER — Ambulatory Visit (INDEPENDENT_AMBULATORY_CARE_PROVIDER_SITE_OTHER): Payer: Medicare Other | Admitting: *Deleted

## 2016-09-09 VITALS — BP 122/71 | HR 60 | Ht 71.0 in | Wt 202.0 lb

## 2016-09-09 DIAGNOSIS — Z0000 Encounter for general adult medical examination without abnormal findings: Secondary | ICD-10-CM | POA: Diagnosis not present

## 2016-09-09 NOTE — Progress Notes (Signed)
Subjective:   Jamori Hegewald is a 75 y.o. male who presents for an Subsequent Medicare Annual Wellness Visit. Mr Sentman is retired from [employer]. He enjoys playing golf twice a week and is very active around his home. He is an active member of his church and participates in many church groups and activities. He lives at home with his wife. He has 2 adult children and 6 grandchildren.   Review of Systems   Cardiac Risk Factors include: advanced age (>74men, >22 women);hypertension;male gender  All systems negative.  Patient reports that his health is about the same as last year.    Objective:    Today's Vitals   09/09/16 1004  BP: 122/71  Pulse: 60  Weight: 202 lb (91.6 kg)  Height: 5\' 11"  (1.803 m)   Body mass index is 28.17 kg/m.  Current Medications (verified) Outpatient Encounter Prescriptions as of 09/09/2016  Medication Sig  . dorzolamide-timolol (COSOPT) 22.3-6.8 MG/ML ophthalmic solution   . Enalapril-Hydrochlorothiazide 5-12.5 MG tablet Take 1 tablet by mouth daily.  Marland Kitchen ibuprofen (ADVIL,MOTRIN) 200 MG tablet Take 200 mg by mouth every 6 (six) hours as needed for pain.  . [DISCONTINUED] azithromycin (ZITHROMAX Z-PAK) 250 MG tablet As directed  . [DISCONTINUED] bimatoprost (LUMIGAN) 0.03 % ophthalmic solution Place 1 drop into both eyes at bedtime.  . [DISCONTINUED] brimonidine (ALPHAGAN) 0.2 % ophthalmic solution Place 1 drop into both eyes daily.  . [DISCONTINUED] COMBIGAN 0.2-0.5 % ophthalmic solution   . [DISCONTINUED] felodipine (PLENDIL) 5 MG 24 hr tablet TAKE 1 TABLET DAILY  . [DISCONTINUED] HYDROcodone-homatropine (HYCODAN) 5-1.5 MG/5ML syrup Take 5 mLs by mouth every 8 (eight) hours as needed for cough.  . [DISCONTINUED] timolol (TIMOPTIC) 0.5 % ophthalmic solution    No facility-administered encounter medications on file as of 09/09/2016.     Allergies (verified) Pravastatin sodium   History: Past Medical History:  Diagnosis Date  . Arthritis   .  Cancer [facility])    prostate ca   seed implants  . Glaucoma   . Hyperlipidemia   . Hypertension    dr Jacelyn Grip     Willeen Niece   fm   Past Surgical History:  Procedure Laterality Date  . BACK SURGERY     x2  . HERNIA REPAIR  11-06-2009   left inguinal repair   . NECK SURGERY    . PROSTATE SURGERY     prsatated cancer  . ROTATOR CUFF REPAIR Left jan 2016  . TRANSURETHRAL RESECTION OF PROSTATE     Family History  Problem Relation Age of Onset  . Cancer Mother     breast  . Heart disease Father   . Cancer Brother   . Cancer Brother    Social History   Occupational History  . Not on file.   Social History Main Topics  . Smoking status: Former Smoker    Quit date: 03/28/2005  . Smokeless tobacco: Never Used  . Alcohol use No  . Drug use: No  . Sexual activity: Not on file   Tobacco Counseling No tobacco use  Activities of Daily Living In your present state of health, do you have any difficulty performing the following activities: 09/09/2016  Hearing? Y  Vision? Y  Difficulty concentrating or making decisions? N  Walking or climbing stairs? N  Dressing or bathing? N  Doing errands, shopping? N  Preparing Food and eating ? N  Using the Toilet? N  In the past six months, have you accidently leaked urine? N  Do  you have problems with loss of bowel control? N  Managing your Medications? N  Managing your Finances? N  Housekeeping or managing your Housekeeping? N  Some recent data might be hidden    Immunizations and Health Maintenance Immunization History  Administered Date(s) Administered  . Influenza, High Dose Seasonal PF 08/15/2014  . Influenza,inj,Quad PF,36+ Mos 09/19/2013  . Pneumococcal Conjugate-13 08/15/2014  . Pneumococcal Polysaccharide-23 07/19/2011  . Tdap 07/19/2011    Patient Care Team: Wardell Honour, MD as PCP - General (Family Medicine) Juanita Craver, MD as Consulting Physician (Gastroenterology) Jovita Gamma, MD as Consulting Physician  (Neurosurgery) Irine Seal, MD as Attending Physician (Urology) Monna Fam, MD as Consulting Physician (Ophthalmology)      Assessment:   This is a routine wellness examination for Joshua Compton.   Hearing/Vision screen Vision exam up to date. No apparent deficits noted in hearing or vision. Patient does report some decrease in hearing but does not wish to f/u with audiology at this time. Will schedule if it worsens.   Dietary issues and exercise activities discussed: Current Exercise Habits: Home exercise routine, Frequency (Times/Week): 2, Intensity: Moderate, Exercise limited by: None identified   Mr Aubry reports eating 1 good meal on most days. He does eat lighter meals or snacks throughout the day. He eats to satisfy hunger and feels this is sufficient for him.   Goals Continue to stay active and incorporate walking into daily routine  Depression Screen PHQ 2/9 Scores 09/09/2016 08/28/2016 07/23/2016 03/27/2016  PHQ - 2 Score 0 0 0 0    Fall Risk Fall Risk  09/09/2016 07/23/2016 03/27/2016 08/16/2015 05/08/2015  Falls in the past year? No No No No No      Screening Tests Health Maintenance  Topic Date Due  . ZOSTAVAX  10/27/2016 (Originally 02/03/2001)  . INFLUENZA VACCINE  02/14/2017 (Originally 06/17/2016)  . COLONOSCOPY  09/09/2017 (Originally 11/17/2013)  . TETANUS/TDAP  07/18/2021  . PNA vac Low Risk Adult  Completed        Plan:  CPE scheduled with Dr Sabra Heck for 01/20/17.  During the course of the visit Kaci was educated and counseled about the following appropriate screening and preventive services:   Vaccines: Pneumoccal-up to date, Influenza,-declined today but will call back to schedule, Tdap-Up to date, Zostavax-declined due to cost  Colorectal cancer screening-declined  Diabetes screening-with routine labs   Glaucoma screening-yearly with Dr Herbert Deaner. Most recent visit was 04/2016.  Nutrition counseling  Patient Instructions (the written plan) were given  to the patient.   Chong Sicilian, RN   09/09/2016     I have reviewed and agree with the above AWV documentation.  Wardell Honour MD

## 2016-09-09 NOTE — Patient Instructions (Signed)
  Joshua Compton , Thank you for taking time to come for your Medicare Wellness Visit. I appreciate your ongoing commitment to your health goals. Please review the following plan we discussed and let me know if I can assist you in the future.   These are the goals we discussed: Goals    None      This is a list of the screening recommended for you and due dates:  Health Maintenance  Topic Date Due  . Shingles Vaccine  10/27/2016*  . Flu Shot  02/14/2017*  . Colon Cancer Screening  09/09/2017*  . Tetanus Vaccine  07/18/2021  . Pneumonia vaccines  Completed  *Topic was postponed. The date shown is not the original due date.

## 2016-10-13 ENCOUNTER — Ambulatory Visit (INDEPENDENT_AMBULATORY_CARE_PROVIDER_SITE_OTHER): Payer: Medicare Other

## 2016-10-13 DIAGNOSIS — Z23 Encounter for immunization: Secondary | ICD-10-CM

## 2016-10-16 DIAGNOSIS — H04123 Dry eye syndrome of bilateral lacrimal glands: Secondary | ICD-10-CM | POA: Diagnosis not present

## 2016-10-16 DIAGNOSIS — H40052 Ocular hypertension, left eye: Secondary | ICD-10-CM | POA: Diagnosis not present

## 2016-10-16 DIAGNOSIS — H401133 Primary open-angle glaucoma, bilateral, severe stage: Secondary | ICD-10-CM | POA: Diagnosis not present

## 2016-11-06 ENCOUNTER — Other Ambulatory Visit: Payer: Self-pay | Admitting: Family Medicine

## 2016-11-06 ENCOUNTER — Telehealth: Payer: Self-pay | Admitting: *Deleted

## 2016-11-06 MED ORDER — FELODIPINE ER 5 MG PO TB24: 5.0000 mg | ORAL_TABLET | Freq: Every day | ORAL | 0 refills | Status: DC

## 2016-11-06 MED ORDER — ENALAPRIL-HYDROCHLOROTHIAZIDE 5-12.5 MG PO TABS: 1.0000 | ORAL_TABLET | Freq: Every day | ORAL | 0 refills | Status: DC

## 2016-11-06 NOTE — Telephone Encounter (Signed)
done

## 2017-01-08 ENCOUNTER — Telehealth: Payer: Self-pay | Admitting: Family Medicine

## 2017-01-08 NOTE — Telephone Encounter (Signed)
What symptoms do you have? Head congestion, cough   How long have you been sick? One week  Have you been seen for this problem? No   If your provider decides to give you a prescription, which pharmacy would you like for it to be sent to? [facility].   Patient informed that this information will be sent to the clinical staff for review and that they should receive a follow up call.

## 2017-01-08 NOTE — Telephone Encounter (Signed)
Appointment made for 01/09/17 8:15 am with Dr. Evette Doffing

## 2017-01-09 ENCOUNTER — Encounter: Payer: Self-pay | Admitting: Pediatrics

## 2017-01-09 ENCOUNTER — Ambulatory Visit (INDEPENDENT_AMBULATORY_CARE_PROVIDER_SITE_OTHER): Payer: Medicare Other | Admitting: Pediatrics

## 2017-01-09 VITALS — BP 133/77 | HR 79 | Temp 97.7°F | Ht 71.0 in | Wt 203.2 lb

## 2017-01-09 DIAGNOSIS — J069 Acute upper respiratory infection, unspecified: Secondary | ICD-10-CM | POA: Diagnosis not present

## 2017-01-09 DIAGNOSIS — J019 Acute sinusitis, unspecified: Secondary | ICD-10-CM

## 2017-01-09 MED ORDER — AZITHROMYCIN 250 MG PO TABS: ORAL_TABLET | ORAL | 0 refills | Status: DC

## 2017-01-09 MED ORDER — FLUTICASONE PROPIONATE 50 MCG/ACT NA SUSP: 2.0000 | Freq: Every day | NASAL | 6 refills | Status: DC

## 2017-01-09 MED ORDER — CETIRIZINE HCL 10 MG PO TABS: 10.0000 mg | ORAL_TABLET | Freq: Every day | ORAL | 11 refills | Status: DC

## 2017-01-09 NOTE — Progress Notes (Signed)
  Subjective:   Patient ID: Joshua Compton, male    DOB: [birth year], 76 y.o.   MRN: [id number] CC: Cough and Nasal Congestion  HPI: Joshua Compton is a 76 y.o. male presenting for Cough and Nasal Congestion  Ongoing for 7 days No fevers Appetite normal Feels congested in head Coughing some, sometimes coughing up white-clear sputum Some headaches mostly in the temples Not feeling worse over the past few days, just not feeling better No sore throat Had some pressure in his ears a few days ago, none now  Relevant past medical, surgical, family and social history reviewed. Allergies and medications reviewed and updated. History  Smoking Status  . Former Smoker  . Quit date: 03/28/2005  Smokeless Tobacco  . Never Used   ROS: Per HPI   Objective:    BP 133/77   Pulse 79   Temp 97.7 F (36.5 C) (Oral)   Ht 5\' 11"  (1.803 m)   Wt 203 lb 3.2 oz (92.2 kg)   BMI 28.34 kg/m   Wt Readings from Last 3 Encounters:  01/09/17 203 lb 3.2 oz (92.2 kg)  09/09/16 202 lb (91.6 kg)  08/28/16 201 lb (91.2 kg)    Gen: NAD, alert, cooperative with exam, NCAT EYES: EOMI, no conjunctival injection, or no icterus ENT:  TMs slightly pink, nl LR, OP with mild erythema, no TTP over sinuses LYMPH: no cervical LAD CV: NRRR, normal S1/S2, no murmur, distal pulses 2+ b/l Resp: CTABL, no wheezes, normal WOB Abd: +BS, soft, NTND. no guarding or organomegaly Ext: No edema, warm Neuro: Alert and oriented, strength equal b/l UE and LE, coordination grossly normal MSK: normal muscle bulk  Assessment & Plan:  Joshua Compton was seen today for cough and nasal congestion.  Diagnoses and all orders for this visit:  Acute URI Ongoing apprx a week No worsening, no improvement Discussed symptomatic care -     cetirizine (ZYRTEC) 10 MG tablet; Take 1 tablet (10 mg total) by mouth daily. -     fluticasone (FLONASE) 50 MCG/ACT nasal spray; Place 2 sprays into both nostrils daily.  Acute sinusitis,  recurrence not specified, unspecified location If not improving over next few days or if feeling worse start below -     azithromycin (ZITHROMAX) 250 MG tablet; Take 2 the first day and then one each day after.   Follow up plan: prn Assunta Found, MD [place]

## 2017-01-09 NOTE — Patient Instructions (Signed)
Netipot with distilled water 2-3 times a day to clear out sinuses Or Normal saline nasal spray Flonase steroid nasal spray Antihistamine daily such as cetirizine Tylenol as needed Lots of fluids  Start antibiotic if feeling worse over the next 2-3 days

## 2017-01-19 NOTE — Progress Notes (Signed)
Subjective:    Patient ID: Joshua Compton, male    DOB: November 30, 1940, 76 y.o.   MRN: [id number]  HPI 76 year old gentleman here to follow-up hypertension. He also has a history of prostate cancer. He questions PSA today. He does not think urologist is checking that. He is asymptomatic with no complaints today.  Patient Active Problem List   Diagnosis Date Noted  . Neoplasm of scalp 12/08/2013  . Rhinitis 12/08/2013  . Cough 12/08/2013  . Hyperlipidemia   . Glaucoma   . Cancer ([place])   . Arthritis   . Hypertension 02/04/2011  . Degenerative disc disease 02/04/2011  . Gastrointestinal bleed 02/04/2011  . Anemia 02/04/2011  . Prostate cancer ([place]) 02/04/2011   Outpatient Encounter Prescriptions as of 01/20/2017  Medication Sig  . cetirizine (ZYRTEC) 10 MG tablet Take 1 tablet (10 mg total) by mouth daily.  . Enalapril-Hydrochlorothiazide 5-12.5 MG tablet Take 1 tablet by mouth daily.  . felodipine (PLENDIL) 5 MG 24 hr tablet Take 1 tablet (5 mg total) by mouth daily.  . fluticasone (FLONASE) 50 MCG/ACT nasal spray Place 2 sprays into both nostrils daily.  . [DISCONTINUED] Enalapril-Hydrochlorothiazide 5-12.5 MG tablet Take 1 tablet by mouth daily.  . [DISCONTINUED] felodipine (PLENDIL) 5 MG 24 hr tablet Take 1 tablet (5 mg total) by mouth daily.  . dorzolamide-timolol (COSOPT) 22.3-6.8 MG/ML ophthalmic solution   . ibuprofen (ADVIL,MOTRIN) 200 MG tablet Take 200 mg by mouth every 6 (six) hours as needed for pain.  . [DISCONTINUED] azithromycin (ZITHROMAX) 250 MG tablet Take 2 the first day and then one each day after.   No facility-administered encounter medications on file as of 01/20/2017.       Review of Systems  Constitutional: Negative.   HENT: Negative.   Eyes: Negative.   Respiratory: Negative.  Negative for shortness of breath.   Cardiovascular: Negative.  Negative for chest pain and leg swelling.  Gastrointestinal: Negative.   Genitourinary: Negative.     Musculoskeletal: Negative.   Skin: Negative.   Neurological: Negative.   Psychiatric/Behavioral: Negative.   All other systems reviewed and are negative.      Objective:   Physical Exam  Constitutional: He is oriented to person, place, and time. He appears well-developed and well-nourished.  HENT:  Right Ear: External ear normal.  Left Ear: External ear normal.  Mouth/Throat: Oropharynx is clear and moist.  Cardiovascular: Normal rate, regular rhythm and normal heart sounds.   Pulmonary/Chest: Effort normal and breath sounds normal.  Musculoskeletal: Normal range of motion.  Neurological: He is alert and oriented to person, place, and time.  Psychiatric: He has a normal mood and affect.   BP 137/75   Pulse 65   Temp 97.7 F (36.5 C) (Oral)   Ht 5' 11" (1.803 m)   Wt 202 lb 6.4 oz (91.8 kg)   BMI 28.23 kg/m         Assessment & Plan:  1. Prostate cancer [facility]) Patient had brachytherapy about 10 years ago - PSA, total and free  2. Hyperlipidemia, unspecified hyperlipidemia type Lipids were in the borderline range. Need to follow - Lipid panel  3. Essential hypertension Patient is on ace and diuretic and calcium channel blocker - CMP14+EGFR  4. Annual physical exam Exam within normal limits.  5. Cold feet Pulses are present. Sensation is intact. - CBC with Differential/Platelet  Wardell Honour MD

## 2017-01-20 ENCOUNTER — Encounter: Payer: Self-pay | Admitting: Family Medicine

## 2017-01-20 ENCOUNTER — Ambulatory Visit (INDEPENDENT_AMBULATORY_CARE_PROVIDER_SITE_OTHER): Payer: Medicare Other | Admitting: Family Medicine

## 2017-01-20 VITALS — BP 137/75 | HR 65 | Temp 97.7°F | Ht 71.0 in | Wt 202.4 lb

## 2017-01-20 DIAGNOSIS — R209 Unspecified disturbances of skin sensation: Secondary | ICD-10-CM

## 2017-01-20 DIAGNOSIS — I10 Essential (primary) hypertension: Secondary | ICD-10-CM

## 2017-01-20 DIAGNOSIS — Z0000 Encounter for general adult medical examination without abnormal findings: Secondary | ICD-10-CM

## 2017-01-20 DIAGNOSIS — E785 Hyperlipidemia, unspecified: Secondary | ICD-10-CM

## 2017-01-20 DIAGNOSIS — C61 Malignant neoplasm of prostate: Secondary | ICD-10-CM

## 2017-01-20 MED ORDER — ENALAPRIL-HYDROCHLOROTHIAZIDE 5-12.5 MG PO TABS: 1.0000 | ORAL_TABLET | Freq: Every day | ORAL | 1 refills | Status: DC

## 2017-01-20 MED ORDER — FELODIPINE ER 5 MG PO TB24: 5.0000 mg | ORAL_TABLET | Freq: Every day | ORAL | 1 refills | Status: DC

## 2017-01-21 LAB — CBC WITH DIFFERENTIAL/PLATELET
BASOS: 1 %
Basophils Absolute: 0.1 10*3/uL (ref 0.0–0.2)
EOS (ABSOLUTE): 0.2 10*3/uL (ref 0.0–0.4)
EOS: 4 %
HEMATOCRIT: 41.8 % (ref 37.5–51.0)
HEMOGLOBIN: 13.9 g/dL (ref 13.0–17.7)
IMMATURE GRANS (ABS): 0 10*3/uL (ref 0.0–0.1)
Immature Granulocytes: 1 %
LYMPHS ABS: 2.3 10*3/uL (ref 0.7–3.1)
LYMPHS: 37 %
MCH: 30.3 pg (ref 26.6–33.0)
MCHC: 33.3 g/dL (ref 31.5–35.7)
MCV: 91 fL (ref 79–97)
MONOCYTES: 7 %
Monocytes Absolute: 0.5 10*3/uL (ref 0.1–0.9)
NEUTROS ABS: 3.1 10*3/uL (ref 1.4–7.0)
Neutrophils: 50 %
Platelets: 326 10*3/uL (ref 150–379)
RBC: 4.59 x10E6/uL (ref 4.14–5.80)
RDW: 14.9 % (ref 12.3–15.4)
WBC: 6.2 10*3/uL (ref 3.4–10.8)

## 2017-01-21 LAB — PSA, TOTAL AND FREE

## 2017-01-21 LAB — CMP14+EGFR
ALBUMIN: 4.1 g/dL (ref 3.5–4.8)
ALK PHOS: 93 IU/L (ref 39–117)
ALT: 20 IU/L (ref 0–44)
AST: 21 IU/L (ref 0–40)
Albumin/Globulin Ratio: 1.2 (ref 1.2–2.2)
BILIRUBIN TOTAL: 0.4 mg/dL (ref 0.0–1.2)
BUN / CREAT RATIO: 13 (ref 10–24)
BUN: 16 mg/dL (ref 8–27)
CHLORIDE: 97 mmol/L (ref 96–106)
CO2: 24 mmol/L (ref 18–29)
Calcium: 9.7 mg/dL (ref 8.6–10.2)
Creatinine, Ser: 1.2 mg/dL (ref 0.76–1.27)
GFR calc non Af Amer: 59 mL/min/{1.73_m2} — ABNORMAL LOW (ref >59)
GFR, EST AFRICAN AMERICAN: 68 mL/min/{1.73_m2} (ref >59)
GLOBULIN, TOTAL: 3.3 g/dL (ref 1.5–4.5)
GLUCOSE: 91 mg/dL (ref 65–99)
Potassium: 4.2 mmol/L (ref 3.5–5.2)
SODIUM: 138 mmol/L (ref 134–144)
TOTAL PROTEIN: 7.4 g/dL (ref 6.0–8.5)

## 2017-01-21 LAB — LIPID PANEL
CHOLESTEROL TOTAL: 186 mg/dL (ref 100–199)
Chol/HDL Ratio: 4.2 ratio units (ref 0.0–5.0)
HDL: 44 mg/dL (ref >39)
LDL CALC: 123 mg/dL — AB (ref 0–99)
Triglycerides: 94 mg/dL (ref 0–149)
VLDL CHOLESTEROL CAL: 19 mg/dL (ref 5–40)

## 2017-04-15 DIAGNOSIS — H401123 Primary open-angle glaucoma, left eye, severe stage: Secondary | ICD-10-CM | POA: Diagnosis not present

## 2017-04-15 DIAGNOSIS — H04123 Dry eye syndrome of bilateral lacrimal glands: Secondary | ICD-10-CM | POA: Diagnosis not present

## 2017-04-15 DIAGNOSIS — H40052 Ocular hypertension, left eye: Secondary | ICD-10-CM | POA: Diagnosis not present

## 2017-04-15 DIAGNOSIS — H401112 Primary open-angle glaucoma, right eye, moderate stage: Secondary | ICD-10-CM | POA: Diagnosis not present

## 2017-04-24 ENCOUNTER — Ambulatory Visit (INDEPENDENT_AMBULATORY_CARE_PROVIDER_SITE_OTHER): Payer: Medicare Other | Admitting: Pediatrics

## 2017-04-24 ENCOUNTER — Encounter: Payer: Self-pay | Admitting: Pediatrics

## 2017-04-24 VITALS — BP 135/66 | HR 73 | Temp 98.3°F | Ht 71.0 in | Wt 202.4 lb

## 2017-04-24 DIAGNOSIS — G4700 Insomnia, unspecified: Secondary | ICD-10-CM | POA: Diagnosis not present

## 2017-04-24 DIAGNOSIS — J069 Acute upper respiratory infection, unspecified: Secondary | ICD-10-CM

## 2017-04-24 MED ORDER — FLUTICASONE PROPIONATE 50 MCG/ACT NA SUSP: 2.0000 | Freq: Every day | NASAL | 6 refills | Status: DC

## 2017-04-24 MED ORDER — CETIRIZINE HCL 10 MG PO TABS: 10.0000 mg | ORAL_TABLET | Freq: Every day | ORAL | 11 refills | Status: DC

## 2017-04-24 MED ORDER — AMOXICILLIN 500 MG PO CAPS: 500.0000 mg | ORAL_CAPSULE | Freq: Two times a day (BID) | ORAL | 0 refills | Status: DC

## 2017-04-24 MED ORDER — MELATONIN 5 MG PO TABS: 5.0000 mg | ORAL_TABLET | Freq: Every evening | ORAL | 0 refills | Status: DC | PRN

## 2017-04-24 NOTE — Patient Instructions (Signed)
Work on sleep hygiene changes as well, no TV after 8 pm, no naps during the day, no TV, cell phone or tablet in the bedroom. If not falling asleep within 15-20 minutes should get up and do something that makes you sleepy, like reading. No TV. Set alarm every morning for the same time, get up every morning same time whether you feel like you got sleep the night before or not so you are retraining yourself to sleep at the right times.   Can try melatonin 6-9mg  30 min before you lie down

## 2017-04-24 NOTE — Progress Notes (Signed)
  Subjective:   Patient ID: Joshua Compton, male    DOB: 1941/01/29, 76 y.o.   MRN: [id number] CC: Cough and Nasal Congestion  HPI: Joshua Compton is a 76 y.o. male presenting for Cough and Nasal Congestion  Past three days coughing more, no sore throat Sometimes coughing things up Ongoing hoarseness since started feeling sick No fevers Eating fine [place] much [place] No abd pain  Having trouble sleeping sometimes at night Rarely naps during day, no more than 10-3min Stays fairly active during the day until it gets hot in the summer  Relevant past medical, surgical, family and social history reviewed. Allergies and medications reviewed and updated. History  Smoking Status  . Former Smoker  . Quit date: 03/28/2005  Smokeless Tobacco  . Never Used   ROS: Per HPI   Objective:    BP 135/66   Pulse 73   Temp 98.3 F (36.8 C) (Oral)   Ht 5\' 11"  (1.803 m)   Wt 202 lb 6.4 oz (91.8 kg)   BMI 28.23 kg/m   Wt Readings from Last 3 Encounters:  04/24/17 202 lb 6.4 oz (91.8 kg)  01/20/17 202 lb 6.4 oz (91.8 kg)  01/09/17 203 lb 3.2 oz (92.2 kg)    Gen: NAD, alert, cooperative with exam, NCAT, congested EYES: EOMI, no conjunctival injection, or no icterus ENT:  TMs slightly pink b/l, OP without erythema LYMPH: no cervical LAD CV: NRRR, normal S1/S2, no murmur, distal pulses 2+ b/l Resp: CTABL, no wheezes, normal WOB Abd: soft, NTND. Ext: No edema, warm Neuro: Alert and oriented, strength equal b/l UE and LE, coordination grossly normal MSK: normal muscle bulk  Assessment & Plan:  Joshua Compton was seen today for cough and nasal congestion.  Diagnoses and all orders for this visit:  Insomnia, unspecified type Discussed sleep hygiene Trial of below -     Melatonin 5 MG TABS; Take 1 tablet (5 mg total) by mouth at bedtime as needed.  Acute URI Discussed symptom care If not improving over next few days can start below antibiotic -     cetirizine  (ZYRTEC) 10 MG tablet; Take 1 tablet (10 mg total) by mouth daily. -     fluticasone (FLONASE) 50 MCG/ACT nasal spray; Place 2 sprays into both nostrils daily.  Other orders -     amoxicillin (AMOXIL) 500 MG capsule; Take 1 capsule (500 mg total) by mouth 2 (two) times daily.   Follow up plan: As scheduled Assunta Found, MD [place]

## 2017-05-08 ENCOUNTER — Telehealth: Payer: Self-pay | Admitting: Family Medicine

## 2017-05-08 NOTE — Telephone Encounter (Signed)
Has refills, he is aware

## 2017-05-19 ENCOUNTER — Encounter: Payer: Self-pay | Admitting: Family Medicine

## 2017-05-19 ENCOUNTER — Ambulatory Visit (INDEPENDENT_AMBULATORY_CARE_PROVIDER_SITE_OTHER): Payer: Medicare Other | Admitting: Family Medicine

## 2017-05-19 VITALS — BP 124/68 | HR 59 | Temp 97.6°F | Ht 71.0 in | Wt 202.0 lb

## 2017-05-19 DIAGNOSIS — E010 Iodine-deficiency related diffuse (endemic) goiter: Secondary | ICD-10-CM

## 2017-05-19 DIAGNOSIS — I10 Essential (primary) hypertension: Secondary | ICD-10-CM

## 2017-05-19 DIAGNOSIS — M1990 Unspecified osteoarthritis, unspecified site: Secondary | ICD-10-CM | POA: Diagnosis not present

## 2017-05-19 DIAGNOSIS — E785 Hyperlipidemia, unspecified: Secondary | ICD-10-CM | POA: Diagnosis not present

## 2017-05-19 DIAGNOSIS — C61 Malignant neoplasm of prostate: Secondary | ICD-10-CM

## 2017-05-19 NOTE — Progress Notes (Signed)
Subjective:  Patient ID: Joshua Compton,  male    DOB: 1941/10/20  Age: 76 y.o.    CC: Follow-up (pt here today for routine follow up of his chronic medical conditions)   HPI Joshua Compton presents for  follow-up of hypertension. Patient has no history of headache chest pain or shortness of breath or recent cough. Patient also denies symptoms of TIA such as numbness weakness lateralizing. Patient checks  blood pressure at home. Recent readings have been good Patient denies side effects from medication. States taking it regularly.  Patient also  in for follow-up of elevated cholesterol. Doing well without complaints on current medication. Denies side effects of statin including myalgia and arthralgia and nausea. Also in today for liver function testing. Currently no chest pain, shortness of breath or other cardiovascular related symptoms noted.  History Joshua Compton has a past medical history of Arthritis; Cancer ([place]); Glaucoma; Hyperlipidemia; and Hypertension.   He has a past surgical history that includes Back surgery; Neck surgery; Transurethral resection of prostate; Prostate surgery; Hernia repair (11-06-2009); and Rotator cuff repair (Left, jan 2016).   His family history includes Cancer in his brother, brother, and mother; Heart disease in his father.He reports that he quit smoking about 12 years ago. He has never used smokeless tobacco. He reports that he does not drink alcohol or use drugs.  Current Outpatient Prescriptions on File Prior to Visit  Medication Sig Dispense Refill  . cetirizine (ZYRTEC) 10 MG tablet Take 1 tablet (10 mg total) by mouth daily. 30 tablet 11  . dorzolamide-timolol (COSOPT) 22.3-6.8 MG/ML ophthalmic solution     . Enalapril-Hydrochlorothiazide 5-12.5 MG tablet Take 1 tablet by mouth daily. 90 tablet 1  . felodipine (PLENDIL) 5 MG 24 hr tablet Take 1 tablet (5 mg total) by mouth daily. 90 tablet 1  . fluticasone (FLONASE) 50 MCG/ACT nasal spray  Place 2 sprays into both nostrils daily. 16 g 6  . ibuprofen (ADVIL,MOTRIN) 200 MG tablet Take 200 mg by mouth every 6 (six) hours as needed for pain.    . Melatonin 5 MG TABS Take 1 tablet (5 mg total) by mouth at bedtime as needed. 30 each 0   No current facility-administered medications on file prior to visit.     ROS Review of Systems  Constitutional: Negative for chills, diaphoresis, fever and unexpected weight change.  HENT: Positive for postnasal drip. Negative for congestion, hearing loss, rhinorrhea and sore throat.   Eyes: Negative for visual disturbance.  Respiratory: Negative for cough and shortness of breath.   Cardiovascular: Negative for chest pain.  Gastrointestinal: Negative for abdominal pain, constipation and diarrhea.  Genitourinary: Negative for dysuria and flank pain.  Musculoskeletal: Negative for arthralgias and joint swelling.  Skin: Negative for rash.  Neurological: Negative for dizziness and headaches.  Psychiatric/Behavioral: Negative for dysphoric mood and sleep disturbance.    Objective:  BP 124/68   Pulse (!) 59   Temp 97.6 F (36.4 C) (Oral)   Ht 5' 11" (1.803 m)   Wt 202 lb (91.6 kg)   BMI 28.17 kg/m   BP Readings from Last 3 Encounters:  05/19/17 124/68  04/24/17 135/66  01/20/17 137/75    Wt Readings from Last 3 Encounters:  05/19/17 202 lb (91.6 kg)  04/24/17 202 lb 6.4 oz (91.8 kg)  01/20/17 202 lb 6.4 oz (91.8 kg)     Physical Exam  Constitutional: He is oriented to person, place, and time. He appears well-developed and well-nourished. No distress.  HENT:  Head: Normocephalic and atraumatic.  Right Ear: External ear normal.  Left Ear: External ear normal.  Nose: Nose normal.  Mouth/Throat: Oropharynx is clear and moist.  Eyes: Conjunctivae and EOM are normal. Pupils are equal, round, and reactive to light.  Neck: Normal range of motion. Neck supple. Thyromegaly (R>L 1 cm approx) present.  Cardiovascular: Normal rate, regular  rhythm and normal heart sounds.   No murmur heard. Pulmonary/Chest: Effort normal and breath sounds normal. No respiratory distress. He has no wheezes. He has no rales.  Abdominal: Soft. Bowel sounds are normal. He exhibits no distension. There is no tenderness.  Lymphadenopathy:    He has no cervical adenopathy.  Neurological: He is alert and oriented to person, place, and time. He has normal reflexes.  Skin: Skin is warm and dry.  Psychiatric: He has a normal mood and affect. His behavior is normal. Judgment and thought content normal.    Diabetic Foot Exam - Simple   No data filed        Assessment & Plan:   Joshua Compton was seen today for follow-up.  Diagnoses and all orders for this visit:  Essential hypertension -     CBC with Differential/Platelet  Hyperlipidemia, unspecified hyperlipidemia type  Arthritis -     CBC with Differential/Platelet  Thyromegaly -     TSH -     US SOFT TISSUE HEAD AND NECK; Future  Prostate cancer ([place]) -     PSA Total (Reflex To Free)  Other orders -     Cancel: CMP14+EGFR -     Cancel: Lipid panel   I have discontinued Joshua Compton amoxicillin. I am also having him maintain his ibuprofen, dorzolamide-timolol, felodipine, Enalapril-Hydrochlorothiazide, cetirizine, fluticasone, and Melatonin.  No orders of the defined types were placed in this encounter.    Follow-up: Return in about 6 months (around 11/19/2017).  [name], M.D.

## 2017-05-20 LAB — CBC WITH DIFFERENTIAL/PLATELET
BASOS ABS: 0.1 10*3/uL (ref 0.0–0.2)
Basos: 2 %
EOS (ABSOLUTE): 0.2 10*3/uL (ref 0.0–0.4)
EOS: 4 %
HEMATOCRIT: 41.7 % (ref 37.5–51.0)
Hemoglobin: 14.1 g/dL (ref 13.0–17.7)
IMMATURE GRANULOCYTES: 0 %
Immature Grans (Abs): 0 10*3/uL (ref 0.0–0.1)
LYMPHS ABS: 2.3 10*3/uL (ref 0.7–3.1)
Lymphs: 42 %
MCH: 31.1 pg (ref 26.6–33.0)
MCHC: 33.8 g/dL (ref 31.5–35.7)
MCV: 92 fL (ref 79–97)
MONOCYTES: 8 %
Monocytes Absolute: 0.5 10*3/uL (ref 0.1–0.9)
NEUTROS PCT: 44 %
Neutrophils Absolute: 2.5 10*3/uL (ref 1.4–7.0)
Platelets: 238 10*3/uL (ref 150–379)
RBC: 4.54 x10E6/uL (ref 4.14–5.80)
RDW: 15.6 % — ABNORMAL HIGH (ref 12.3–15.4)
WBC: 5.5 10*3/uL (ref 3.4–10.8)

## 2017-05-20 LAB — TSH: TSH: 1.39 u[IU]/mL (ref 0.450–4.500)

## 2017-05-20 LAB — PSA TOTAL (REFLEX TO FREE)

## 2017-05-25 ENCOUNTER — Ambulatory Visit (HOSPITAL_COMMUNITY): Payer: Medicare Other

## 2017-05-28 ENCOUNTER — Ambulatory Visit (HOSPITAL_COMMUNITY)
Admission: RE | Admit: 2017-05-28 | Discharge: 2017-05-28 | Disposition: A | Discharge status: Home / Self Care | Payer: Medicare Other | Source: Ambulatory Visit | Attending: Family Medicine | Admitting: Family Medicine

## 2017-05-28 DIAGNOSIS — E041 Nontoxic single thyroid nodule: Secondary | ICD-10-CM | POA: Diagnosis not present

## 2017-05-28 DIAGNOSIS — E010 Iodine-deficiency related diffuse (endemic) goiter: Secondary | ICD-10-CM

## 2017-06-01 ENCOUNTER — Other Ambulatory Visit: Payer: Self-pay | Admitting: Family Medicine

## 2017-06-01 DIAGNOSIS — E010 Iodine-deficiency related diffuse (endemic) goiter: Secondary | ICD-10-CM

## 2017-06-03 NOTE — Progress Notes (Signed)
Order for US guided biopsy entered in Epic Pt aware of results per Dr Livia Snellen

## 2017-06-11 ENCOUNTER — Telehealth: Payer: Self-pay

## 2017-06-11 NOTE — Telephone Encounter (Signed)
Patient thought he was suppose to get a thyroid BX?    Calling to see whats going on

## 2017-06-12 ENCOUNTER — Other Ambulatory Visit: Payer: Self-pay

## 2017-06-12 ENCOUNTER — Other Ambulatory Visit: Payer: Self-pay | Admitting: Family Medicine

## 2017-06-12 DIAGNOSIS — E041 Nontoxic single thyroid nodule: Secondary | ICD-10-CM

## 2017-06-12 NOTE — Telephone Encounter (Signed)
Please contact the patient . I have ordered the test. Should be done in the next week. Thanks, WS

## 2017-06-12 NOTE — Telephone Encounter (Signed)
Aware. 

## 2017-06-18 ENCOUNTER — Encounter (HOSPITAL_COMMUNITY): Payer: Self-pay

## 2017-06-18 ENCOUNTER — Ambulatory Visit (HOSPITAL_COMMUNITY)
Admission: RE | Admit: 2017-06-18 | Discharge: 2017-06-18 | Disposition: A | Discharge status: Home / Self Care | Payer: Medicare Other | Source: Ambulatory Visit | Attending: Family Medicine | Admitting: Family Medicine

## 2017-06-18 DIAGNOSIS — E0789 Other specified disorders of thyroid: Secondary | ICD-10-CM | POA: Diagnosis not present

## 2017-06-18 DIAGNOSIS — E041 Nontoxic single thyroid nodule: Secondary | ICD-10-CM | POA: Insufficient documentation

## 2017-06-18 MED ORDER — LIDOCAINE HCL (PF) 2 % IJ SOLN
INTRAMUSCULAR | Status: AC
  Administered 2017-06-18: 10 mL
  Filled 2017-06-18: qty 10

## 2017-06-18 NOTE — Discharge Instructions (Signed)
Thyroid Biopsy °The thyroid gland is a butterfly-shaped gland located in the front of the neck. It produces hormones that affect metabolism, growth and development, and body temperature. Thyroid biopsy is a procedure in which small samples of tissue or fluid are removed from the thyroid gland. The samples are then looked at under a microscope to check for abnormalities. This procedure is done to determine the cause of thyroid problems. It may be done to check for infection, cancer, or other thyroid problems. °Two methods may be used for a thyroid biopsy. In one method, a thin needle is inserted through the skin and into the thyroid gland. In the other method, an open incision is made through the skin. °Tell a health care provider about: °· Any allergies you have. °· All medicines you are taking, including vitamins, herbs, eye drops, creams, and over-the-counter medicines. °· Any problems you or family members have had with anesthetic medicines. °· Any blood disorders you have. °· Any surgeries you have had. °· Any medical conditions you have. °What are the risks? °Generally, this is a safe procedure. However, problems can occur and include: °· Bleeding from the procedure site. °· Infection. °· Injury to structures near the thyroid gland. ° °What happens before the procedure? °· Ask your health care provider about: °? Changing or stopping your regular medicines. This is especially important if you are taking diabetes medicines or blood thinners. °? Taking medicines such as aspirin and ibuprofen. These medicines can thin your blood. Do not take these medicines before your procedure if your health care provider asks you not to. °· Do not eat or drink anything after midnight on the night before the procedure or as directed by your health care provider. °· You may have a blood sample taken. °What happens during the procedure? °Either of these methods may be used to perform a thyroid biopsy: °· Fine needle biopsy. You may  be given medicine to help you relax (sedative). You will be asked to lie on your back with your head tipped backward to extend your neck. An area on your neck will be cleaned. A needle will then be inserted through the skin of your neck. You may be asked to avoid coughing, talking, swallowing, or making sounds during some portions of the procedure. The needle will be withdrawn once the tissue or fluid samples have been removed. Pressure may be applied to your neck to reduce swelling and ensure that bleeding has stopped. The samples will be sent to a lab for examination. °· Open biopsy. You will be given medicine to make you sleep (general anesthetic). An incision will be made in your neck. A sample of thyroid tissue will be removed using surgical tools. The tissue sample will be sent for examination. In some cases, the sample may be examined during the biopsy. If that is done and cancer cells are found, some or all of the thyroid gland may be removed. The incision will be closed with stitches. ° °What happens after the procedure? °· Your recovery will be assessed and monitored. °· You may have soreness and tenderness at the site of the biopsy. This should go away after a few days. °· If you had an open biopsy, you may have a hoarse voice or sore throat for a couple days. °· It is your responsibility to get your test results. °This information is not intended to replace advice given to you by your health care provider. Make sure you discuss any questions you have with   your health care provider. °Document Released: 08/31/2007 Document Revised: 07/06/2016 Document Reviewed: 01/26/2014 °Elsevier Interactive Patient Education © 2018 [employer] ° °

## 2017-08-06 ENCOUNTER — Other Ambulatory Visit: Payer: Self-pay | Admitting: Family Medicine

## 2017-08-14 ENCOUNTER — Ambulatory Visit (INDEPENDENT_AMBULATORY_CARE_PROVIDER_SITE_OTHER): Payer: Medicare Other | Admitting: Urology

## 2017-08-14 ENCOUNTER — Other Ambulatory Visit (HOSPITAL_COMMUNITY)
Admission: AD | Admit: 2017-08-14 | Discharge: 2017-08-14 | Disposition: A | Discharge status: Home / Self Care | Payer: Medicare Other | Source: Other Acute Inpatient Hospital | Attending: Urology | Admitting: Urology

## 2017-08-14 DIAGNOSIS — R3121 Asymptomatic microscopic hematuria: Secondary | ICD-10-CM | POA: Insufficient documentation

## 2017-08-14 DIAGNOSIS — N5201 Erectile dysfunction due to arterial insufficiency: Secondary | ICD-10-CM

## 2017-08-14 DIAGNOSIS — Z8546 Personal history of malignant neoplasm of prostate: Secondary | ICD-10-CM | POA: Insufficient documentation

## 2017-08-14 LAB — URINALYSIS, COMPLETE (UACMP) WITH MICROSCOPIC
Bacteria, UA: NONE SEEN
Bilirubin Urine: NEGATIVE
GLUCOSE, UA: NEGATIVE mg/dL
Ketones, ur: NEGATIVE mg/dL
Leukocytes, UA: NEGATIVE
Nitrite: NEGATIVE
PH: 5 (ref 5.0–8.0)
Protein, ur: NEGATIVE mg/dL
Specific Gravity, Urine: 1.018 (ref 1.005–1.030)

## 2017-09-10 ENCOUNTER — Ambulatory Visit (INDEPENDENT_AMBULATORY_CARE_PROVIDER_SITE_OTHER): Payer: Medicare Other

## 2017-09-10 DIAGNOSIS — Z23 Encounter for immunization: Secondary | ICD-10-CM | POA: Diagnosis not present

## 2017-10-20 DIAGNOSIS — H40052 Ocular hypertension, left eye: Secondary | ICD-10-CM | POA: Diagnosis not present

## 2017-10-20 DIAGNOSIS — H04123 Dry eye syndrome of bilateral lacrimal glands: Secondary | ICD-10-CM | POA: Diagnosis not present

## 2017-10-20 DIAGNOSIS — H401112 Primary open-angle glaucoma, right eye, moderate stage: Secondary | ICD-10-CM | POA: Diagnosis not present

## 2017-10-20 DIAGNOSIS — H401123 Primary open-angle glaucoma, left eye, severe stage: Secondary | ICD-10-CM | POA: Diagnosis not present

## 2017-11-09 ENCOUNTER — Emergency Department (HOSPITAL_COMMUNITY): Admission: EM | Admit: 2017-11-09 | Payer: Medicare Other | Source: Home / Self Care

## 2017-11-11 ENCOUNTER — Ambulatory Visit (INDEPENDENT_AMBULATORY_CARE_PROVIDER_SITE_OTHER): Payer: Medicare Other | Admitting: Family Medicine

## 2017-11-11 ENCOUNTER — Encounter: Payer: Self-pay | Admitting: Family Medicine

## 2017-11-11 VITALS — BP 119/61 | HR 62 | Temp 97.7°F | Ht 71.0 in | Wt 202.0 lb

## 2017-11-11 DIAGNOSIS — M545 Low back pain: Secondary | ICD-10-CM | POA: Diagnosis not present

## 2017-11-11 DIAGNOSIS — M549 Dorsalgia, unspecified: Secondary | ICD-10-CM | POA: Diagnosis not present

## 2017-11-11 LAB — URINALYSIS
Bilirubin, UA: NEGATIVE
Glucose, UA: NEGATIVE
KETONES UA: NEGATIVE
LEUKOCYTES UA: NEGATIVE
NITRITE UA: NEGATIVE
Protein, UA: NEGATIVE
SPEC GRAV UA: 1.02 (ref 1.005–1.030)
Urobilinogen, Ur: 0.2 mg/dL (ref 0.2–1.0)
pH, UA: 6.5 (ref 5.0–7.5)

## 2017-11-11 MED ORDER — CYCLOBENZAPRINE HCL 5 MG PO TABS: 5.0000 mg | ORAL_TABLET | Freq: Three times a day (TID) | ORAL | 0 refills | Status: DC | PRN

## 2017-11-11 MED ORDER — PREDNISONE 10 MG (48) PO TBPK: ORAL_TABLET | ORAL | 0 refills | Status: DC

## 2017-11-11 NOTE — Progress Notes (Signed)
Chief Complaint  Patient presents with  . Back Pain    pt here today c/o back pain     HPI  Patient presents today for Onset of left lower back pain 8 days ago. Getting worse over time. He describes it as sharp. It does not cramp. It is sore. Moderate intensity. Increases with movements, particularly twisting torso side to side. There is no known injury. Pt. Denies dysuria. Some radiation anteriorly, but not past the midaxillary line.   PMH: Smoking status noted ROS: Per HPI  Objective: BP 119/61   Pulse 62   Temp 97.7 F (36.5 C) (Oral)   Ht 5\' 11"  (1.803 m)   Wt 202 lb (91.6 kg)   BMI 28.17 kg/m  Gen: NAD, alert, cooperative with exam HEENT: NCAT, EOMI, PERRL CV: RRR, good S1/S2, no murmur Resp: CTABL, no wheezes, non-labored Abd: SNTND, BS present, no guarding or organomegaly Ext: No edema, warm. Mild to moderate left lumbar tenderness. No palpable spasm. Neuro: Alert and oriented, No gross deficits  Assessment and plan:  1. Musculoskeletal back pain     Meds ordered this encounter  Medications  . predniSONE (STERAPRED UNI-PAK 48 TAB) 10 MG (48) TBPK tablet    Sig: Take as directed    Dispense:  48 tablet    Refill:  0  . cyclobenzaprine (FLEXERIL) 5 MG tablet    Sig: Take 1 tablet (5 mg total) by mouth 3 (three) times daily as needed for muscle spasms.    Dispense:  90 tablet    Refill:  0    Orders Placed This Encounter  Procedures  . Urinalysis    Follow up as needed.  [name], MD

## 2017-11-11 NOTE — Patient Instructions (Signed)

## 2017-11-17 ENCOUNTER — Encounter: Payer: Self-pay | Admitting: Family Medicine

## 2017-11-18 ENCOUNTER — Encounter: Payer: Self-pay | Admitting: Family Medicine

## 2017-11-18 ENCOUNTER — Ambulatory Visit (INDEPENDENT_AMBULATORY_CARE_PROVIDER_SITE_OTHER): Payer: Medicare Other | Admitting: Family Medicine

## 2017-11-18 VITALS — BP 128/67 | HR 66 | Temp 96.9°F | Ht 71.0 in | Wt 203.0 lb

## 2017-11-18 DIAGNOSIS — I10 Essential (primary) hypertension: Secondary | ICD-10-CM | POA: Diagnosis not present

## 2017-11-18 DIAGNOSIS — M1990 Unspecified osteoarthritis, unspecified site: Secondary | ICD-10-CM | POA: Diagnosis not present

## 2017-11-18 LAB — CMP14+EGFR
ALT: 26 IU/L (ref 0–44)
AST: 19 IU/L (ref 0–40)
Albumin/Globulin Ratio: 1.4 (ref 1.2–2.2)
Albumin: 4.3 g/dL (ref 3.5–4.8)
Alkaline Phosphatase: 100 IU/L (ref 39–117)
BUN/Creatinine Ratio: 19 (ref 10–24)
BUN: 19 mg/dL (ref 8–27)
Bilirubin Total: 0.4 mg/dL (ref 0.0–1.2)
CALCIUM: 10.1 mg/dL (ref 8.6–10.2)
CO2: 25 mmol/L (ref 20–29)
CREATININE: 1.02 mg/dL (ref 0.76–1.27)
Chloride: 98 mmol/L (ref 96–106)
GFR, EST AFRICAN AMERICAN: 82 mL/min/{1.73_m2} (ref >59)
GFR, EST NON AFRICAN AMERICAN: 71 mL/min/{1.73_m2} (ref >59)
GLUCOSE: 85 mg/dL (ref 65–99)
Globulin, Total: 3.1 g/dL (ref 1.5–4.5)
Potassium: 4.3 mmol/L (ref 3.5–5.2)
Sodium: 139 mmol/L (ref 134–144)
TOTAL PROTEIN: 7.4 g/dL (ref 6.0–8.5)

## 2017-11-18 LAB — CBC WITH DIFFERENTIAL/PLATELET
BASOS: 0 %
Basophils Absolute: 0 10*3/uL (ref 0.0–0.2)
EOS (ABSOLUTE): 0.1 10*3/uL (ref 0.0–0.4)
Eos: 1 %
Hematocrit: 42.6 % (ref 37.5–51.0)
Hemoglobin: 14.4 g/dL (ref 13.0–17.7)
IMMATURE GRANS (ABS): 0.1 10*3/uL (ref 0.0–0.1)
IMMATURE GRANULOCYTES: 1 %
LYMPHS: 26 %
Lymphocytes Absolute: 2.4 10*3/uL (ref 0.7–3.1)
MCH: 30.6 pg (ref 26.6–33.0)
MCHC: 33.8 g/dL (ref 31.5–35.7)
MCV: 90 fL (ref 79–97)
MONOS ABS: 0.9 10*3/uL (ref 0.1–0.9)
Monocytes: 10 %
Neutrophils Absolute: 5.8 10*3/uL (ref 1.4–7.0)
Neutrophils: 62 %
PLATELETS: 261 10*3/uL (ref 150–379)
RBC: 4.71 x10E6/uL (ref 4.14–5.80)
RDW: 14.9 % (ref 12.3–15.4)
WBC: 9.4 10*3/uL (ref 3.4–10.8)

## 2017-11-18 MED ORDER — FELODIPINE ER 5 MG PO TB24: 5.0000 mg | ORAL_TABLET | Freq: Every day | ORAL | 1 refills | Status: DC

## 2017-11-18 MED ORDER — ENALAPRIL-HYDROCHLOROTHIAZIDE 5-12.5 MG PO TABS: 1.0000 | ORAL_TABLET | Freq: Every day | ORAL | 1 refills | Status: DC

## 2017-11-18 NOTE — Progress Notes (Signed)
Subjective:  Patient ID: Joshua Guiles., male    DOB: Jul 25, 1941  Age: 77 y.o. MRN: [id number]  CC: Hypertension (pt here today for routine follow up of his chronic medical conditions and also has questions regarding what he thinks is "moles" on his face)   HPI Joshua Compton. presents for  follow-up of hypertension. Patient has no history of headache chest pain or shortness of breath or recent cough. Patient also denies symptoms of TIA such as focal numbness or weakness. Patient checks  blood pressure at home. Readings recently have been pretty good.  In fact so good that he wonders if he really needs the blood pressure medicine and has considered taking himself off of it.  Patient denies side effects from medication. States taking it regularly. Patient seen recently for back problems still having some soreness on the left side near the flank.  See my note of approximately 2 weeks ago.  That was reviewed with him.  Of note is there is no radiation along a dermatomal pattern.  Slight radiation toward the flank from the left lower back is noted.  History Joshua Compton has a past medical history of Arthritis, Cancer ([place]), Glaucoma, Hyperlipidemia, and Hypertension.   He has a past surgical history that includes Back surgery; Neck surgery; Transurethral resection of prostate; Prostate surgery; Hernia repair (11-06-2009); and Rotator cuff repair (Left, jan 2016).   His family history includes Cancer in his brother, brother, and mother; Heart disease in his father.He reports that he quit smoking about 12 years ago. he has never used smokeless tobacco. He reports that he does not drink alcohol or use drugs.  Current Outpatient Medications on File Prior to Visit  Medication Sig Dispense Refill  . cetirizine (ZYRTEC) 10 MG tablet Take 1 tablet (10 mg total) by mouth daily. 30 tablet 11  . cyclobenzaprine (FLEXERIL) 5 MG tablet Take 1 tablet (5 mg total) by mouth 3 (three) times daily as  needed for muscle spasms. 90 tablet 0  . dorzolamide-timolol (COSOPT) 22.3-6.8 MG/ML ophthalmic solution     . fluticasone (FLONASE) 50 MCG/ACT nasal spray Place 2 sprays into both nostrils daily. 16 g 6  . ibuprofen (ADVIL,MOTRIN) 200 MG tablet Take 200 mg by mouth every 6 (six) hours as needed for pain.    . Melatonin 5 MG TABS Take 1 tablet (5 mg total) by mouth at bedtime as needed. 30 each 0  . predniSONE (STERAPRED UNI-PAK 48 TAB) 10 MG (48) TBPK tablet Take as directed 48 tablet 0   No current facility-administered medications on file prior to visit.     ROS Review of Systems  Constitutional: Negative for chills, diaphoresis, fever and unexpected weight change.  HENT: Negative for congestion, hearing loss, rhinorrhea and sore throat.   Eyes: Negative for visual disturbance.  Respiratory: Negative for cough and shortness of breath.   Cardiovascular: Negative for chest pain.  Gastrointestinal: Negative for abdominal pain, constipation and diarrhea.  Genitourinary: Negative for dysuria and flank pain.  Musculoskeletal: Positive for arthralgias (He has been told he had spinal arthritis in the past..  Currently occasional joint discomfort with back as noted above etc.) and back pain. Negative for gait problem and joint swelling.  Skin: Negative for rash.  Neurological: Negative for dizziness and headaches.  Psychiatric/Behavioral: Negative for dysphoric mood and sleep disturbance.    Objective:  BP 128/67   Pulse 66   Temp (!) 96.9 F (36.1 C) (Oral)   Ht '5\' 11"'  (1.803 m)  Wt 203 lb (92.1 kg)   BMI 28.31 kg/m   BP Readings from Last 3 Encounters:  11/18/17 128/67  11/11/17 119/61  06/18/17 134/72    Wt Readings from Last 3 Encounters:  11/18/17 203 lb (92.1 kg)  11/11/17 202 lb (91.6 kg)  05/19/17 202 lb (91.6 kg)     Physical Exam  Constitutional: He is oriented to person, place, and time. He appears well-developed and well-nourished. No distress.  HENT:  Head:  Normocephalic and atraumatic.  Right Ear: External ear normal.  Left Ear: External ear normal.  Nose: Nose normal.  Mouth/Throat: Oropharynx is clear and moist.  Eyes: Conjunctivae and EOM are normal. Pupils are equal, round, and reactive to light.  Neck: Normal range of motion. Neck supple. No thyromegaly present.  Cardiovascular: Normal rate, regular rhythm and normal heart sounds.  No murmur heard. Pulmonary/Chest: Effort normal and breath sounds normal. No respiratory distress. He has no wheezes. He has no rales.  Abdominal: Soft. Bowel sounds are normal. He exhibits no distension. There is no tenderness.  Lymphadenopathy:    He has no cervical adenopathy.  Neurological: He is alert and oriented to person, place, and time. He has normal reflexes.  Skin: Skin is warm and dry.  Psychiatric: He has a normal mood and affect. His behavior is normal. Judgment and thought content normal.      Assessment & Plan:   Joshua Compton was seen today for hypertension.  Diagnoses and all orders for this visit:  Essential hypertension -     CBC with Differential/Platelet -     CMP14+EGFR  Arthritis  Other orders -     Enalapril-Hydrochlorothiazide 5-12.5 MG tablet; Take 1 tablet by mouth daily. -     felodipine (PLENDIL) 5 MG 24 hr tablet; Take 1 tablet (5 mg total) by mouth daily.   Allergies as of 11/18/2017      Reactions   Pravastatin Sodium    Muscle aches      Medication List        Accurate as of 11/18/17  9:47 AM. Always use your most recent med list.          cetirizine 10 MG tablet Commonly known as:  ZYRTEC Take 1 tablet (10 mg total) by mouth daily.   cyclobenzaprine 5 MG tablet Commonly known as:  FLEXERIL Take 1 tablet (5 mg total) by mouth 3 (three) times daily as needed for muscle spasms.   dorzolamide-timolol 22.3-6.8 MG/ML ophthalmic solution Commonly known as:  COSOPT   Enalapril-Hydrochlorothiazide 5-12.5 MG tablet Take 1 tablet by mouth daily.     felodipine 5 MG 24 hr tablet Commonly known as:  PLENDIL Take 1 tablet (5 mg total) by mouth daily.   fluticasone 50 MCG/ACT nasal spray Commonly known as:  FLONASE Place 2 sprays into both nostrils daily.   ibuprofen 200 MG tablet Commonly known as:  ADVIL,MOTRIN Take 200 mg by mouth every 6 (six) hours as needed for pain.   Melatonin 5 MG Tabs Take 1 tablet (5 mg total) by mouth at bedtime as needed.   predniSONE 10 MG (48) Tbpk tablet Commonly known as:  STERAPRED UNI-PAK 48 TAB Take as directed       Meds ordered this encounter  Medications  . Enalapril-Hydrochlorothiazide 5-12.5 MG tablet    Sig: Take 1 tablet by mouth daily.    Dispense:  90 tablet    Refill:  1  . felodipine (PLENDIL) 5 MG 24 hr tablet    Sig:  Take 1 tablet (5 mg total) by mouth daily.    Dispense:  90 tablet    Refill:  1    Patient to continue meds as is for now.  However if he wants to try to wean off of his blood pressure medicines he should first commit to a DASH eating program.  He should start with the enalapril HCTZ going to 1/2 pill with a first.  Continues to commit to checking his blood pressure daily at home and monitoring for arise from baseline after monitoring it daily for at least a week on his current regimen.  Follow-up: Return in about 6 months (around 05/18/2018).  [name], M.D.

## 2017-11-25 IMAGING — US US SOFT TISSUE HEAD/NECK
1 series · 13 of 25 positions shown · non-contrast
Comparison: None.

CLINICAL DATA: Feels something in throat x years

EXAM:
THYROID ULTRASOUND
TECHNIQUE: Ultrasound examination of the thyroid gland and adjacent soft
tissues was performed.

[Series 1: us soft tissue head/neck · 0.07mm/px · 13 of 55 slices shown]
[im 1/55]
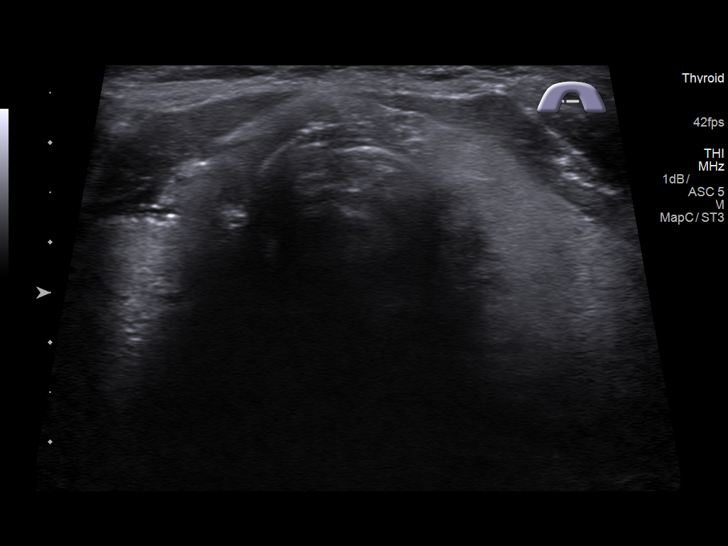
[im 5/55]
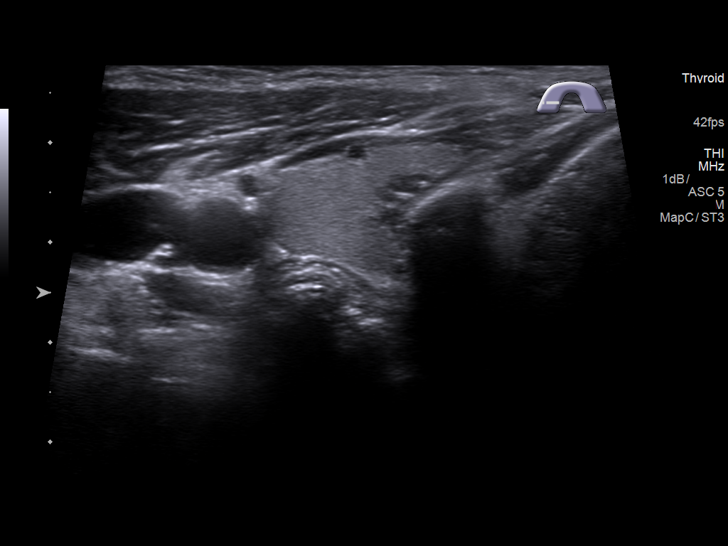
[im 10/55]
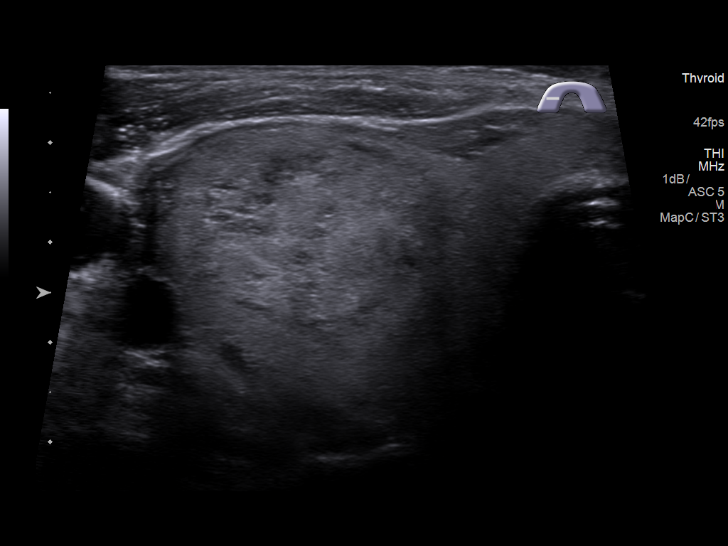
[im 14/55]
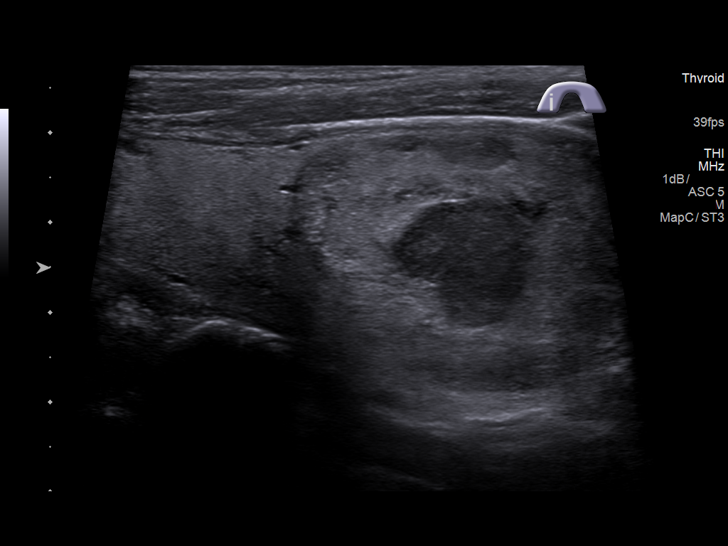
[im 19/55]
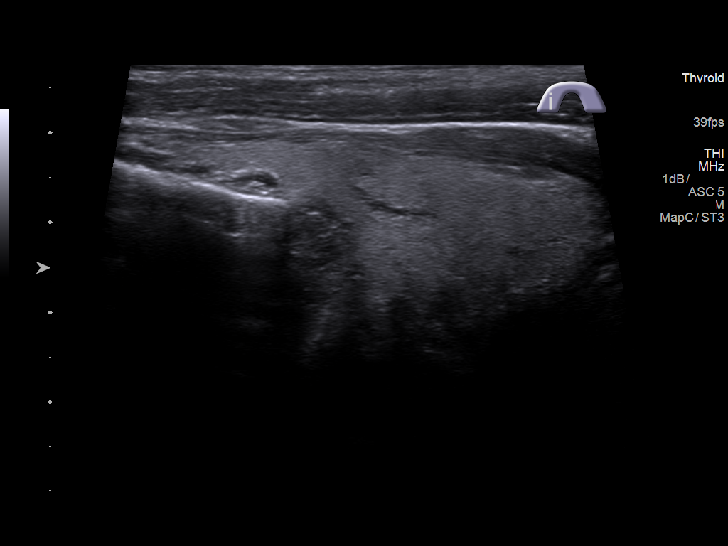
[im 23/55]
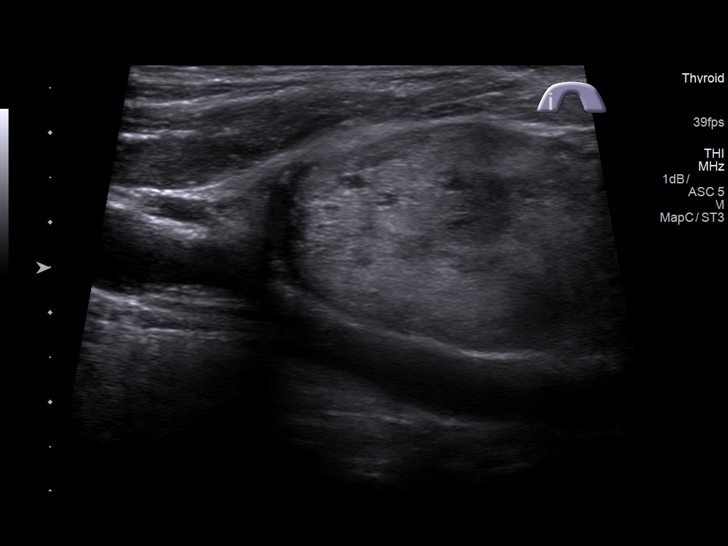
[im 28/55]
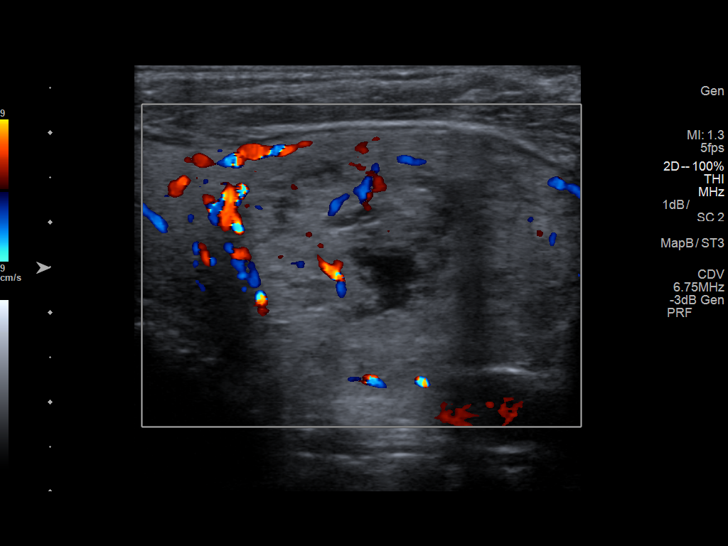
[im 32/55]
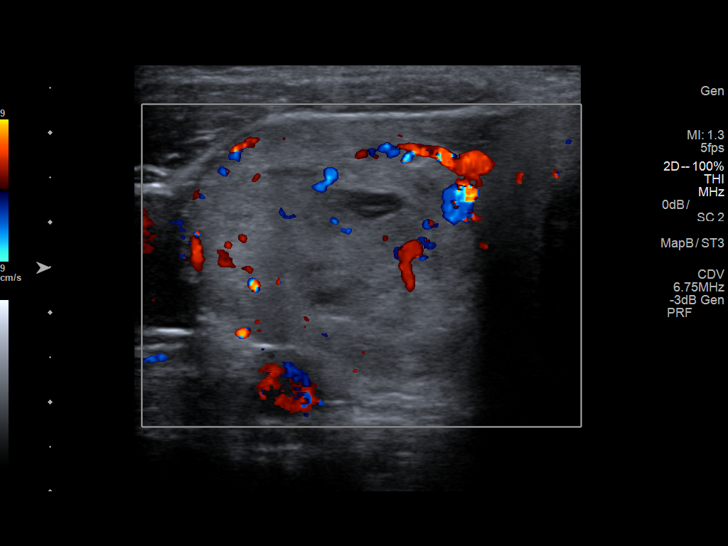
[im 37/55]
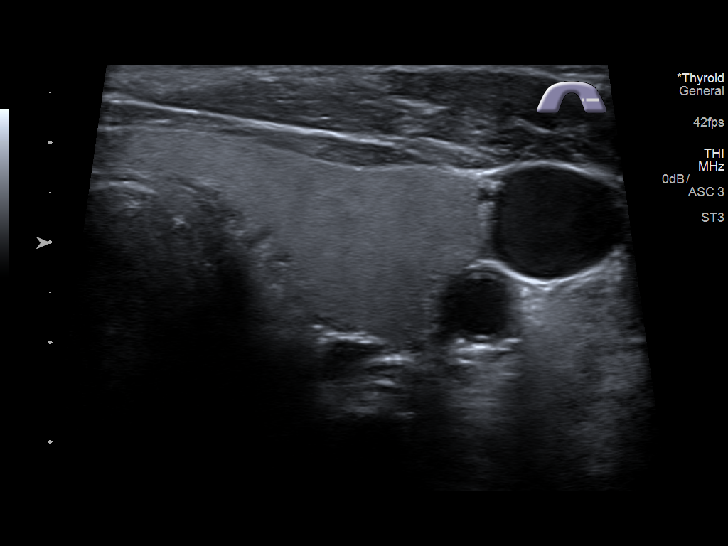
[im 41/55]
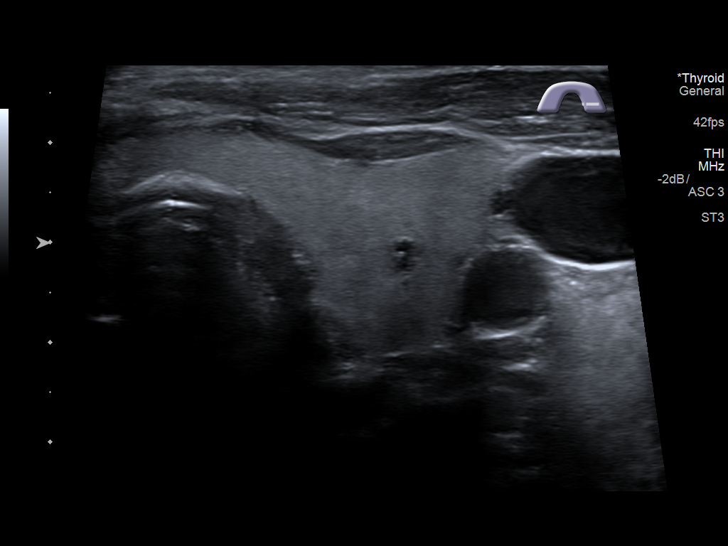
[im 46/55]
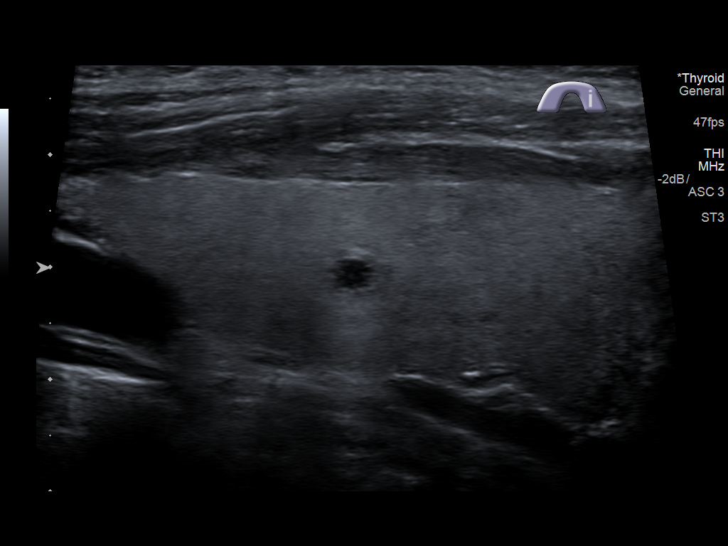
[im 50/55]
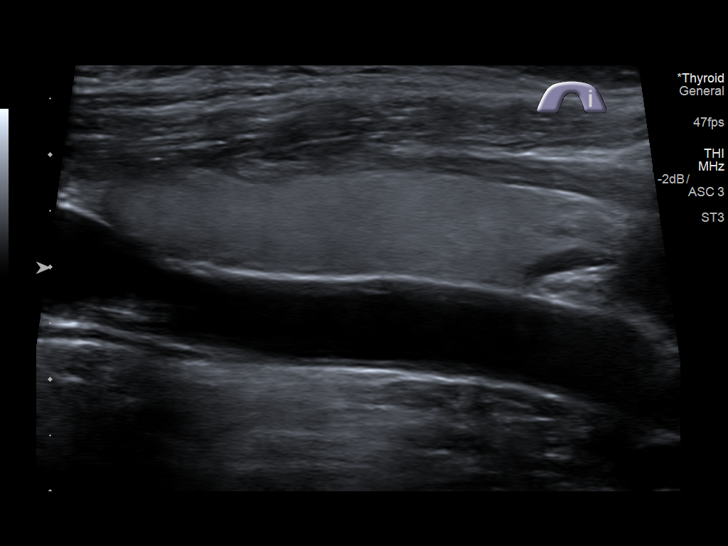
[im 55/55]
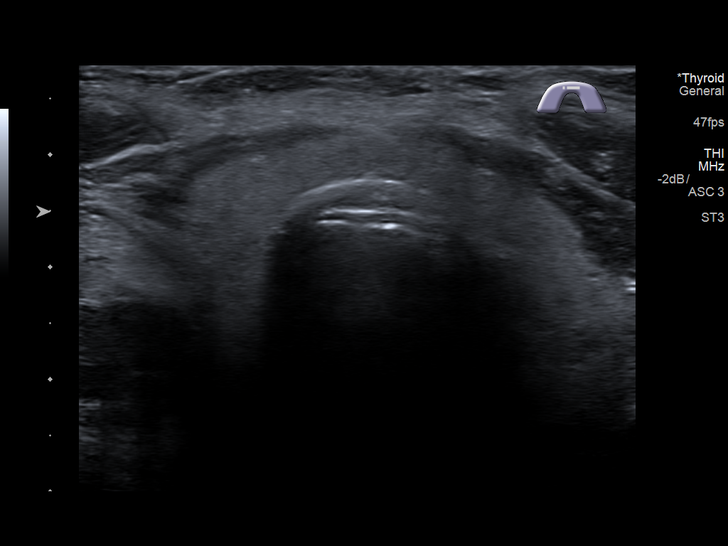

[13 of 25 positions shown; findings below may reference images not displayed]

FINDINGS: Parenchymal Echotexture: Mildly heterogenous

Isthmus: 0.4 cm thickness

Right lobe: 7.8 x 4.1 x 3.9 cm

Left lobe: 5.4 x 1.7 x 2.5 cm

_________________________________________________________

Estimated total number of nodules >/= 1 cm: 1

Number of spongiform nodules >/=  2 cm not described below (TR1): 0

Number of mixed cystic and solid nodules >/= 1.5 cm not described
below (TR2): 0

_________________________________________________________

Nodule # 1:

Location: Right; Inferior

Maximum size: 4.2 cm; Other 2 dimensions: 2.7 x 3.6 cm

Composition: solid/almost completely solid (2)

Echogenicity: isoechoic (1)

Shape: not taller-than-wide (0)

Margins: ill-defined (0)

Echogenic foci: none (0)

ACR TI-RADS total points: 3.

ACR TI-RADS risk category: TR3 (3 points).

ACR TI-RADS recommendations:

**Given size (>/= 2.5 cm) and appearance, fine needle aspiration of
this mildly suspicious nodule should be considered based on TI-RADS
criteria.

_________________________________________________________
IMPRESSION: 1. Thyromegaly with solitary 4.2 cm inferior right nodule, mildly
suspicious. Recommend FNA biopsy.

The above is in keeping with the ACR TI-RADS recommendations - [HOSPITAL] 7127;[DATE].

## 2018-04-20 DIAGNOSIS — H401112 Primary open-angle glaucoma, right eye, moderate stage: Secondary | ICD-10-CM | POA: Diagnosis not present

## 2018-04-20 DIAGNOSIS — H40052 Ocular hypertension, left eye: Secondary | ICD-10-CM | POA: Diagnosis not present

## 2018-04-20 DIAGNOSIS — H401123 Primary open-angle glaucoma, left eye, severe stage: Secondary | ICD-10-CM | POA: Diagnosis not present

## 2018-05-25 ENCOUNTER — Ambulatory Visit (INDEPENDENT_AMBULATORY_CARE_PROVIDER_SITE_OTHER): Payer: Medicare Other | Admitting: Family Medicine

## 2018-05-25 ENCOUNTER — Other Ambulatory Visit: Payer: Self-pay | Admitting: Family Medicine

## 2018-05-25 ENCOUNTER — Encounter: Payer: Self-pay | Admitting: Family Medicine

## 2018-05-25 ENCOUNTER — Ambulatory Visit (INDEPENDENT_AMBULATORY_CARE_PROVIDER_SITE_OTHER): Payer: Medicare Other

## 2018-05-25 VITALS — BP 113/63 | HR 58 | Temp 97.0°F | Ht 71.0 in | Wt 200.0 lb

## 2018-05-25 DIAGNOSIS — Z125 Encounter for screening for malignant neoplasm of prostate: Secondary | ICD-10-CM | POA: Diagnosis not present

## 2018-05-25 DIAGNOSIS — K2100 Gastro-esophageal reflux disease with esophagitis, without bleeding: Secondary | ICD-10-CM

## 2018-05-25 DIAGNOSIS — M1990 Unspecified osteoarthritis, unspecified site: Secondary | ICD-10-CM

## 2018-05-25 DIAGNOSIS — M25552 Pain in left hip: Secondary | ICD-10-CM

## 2018-05-25 DIAGNOSIS — M1612 Unilateral primary osteoarthritis, left hip: Secondary | ICD-10-CM | POA: Diagnosis not present

## 2018-05-25 DIAGNOSIS — E782 Mixed hyperlipidemia: Secondary | ICD-10-CM | POA: Diagnosis not present

## 2018-05-25 DIAGNOSIS — I10 Essential (primary) hypertension: Secondary | ICD-10-CM | POA: Diagnosis not present

## 2018-05-25 DIAGNOSIS — E010 Iodine-deficiency related diffuse (endemic) goiter: Secondary | ICD-10-CM

## 2018-05-25 MED ORDER — PANTOPRAZOLE SODIUM 40 MG PO TBEC: 40.0000 mg | DELAYED_RELEASE_TABLET | Freq: Every day | ORAL | 1 refills | Status: DC

## 2018-05-25 MED ORDER — FELODIPINE ER 5 MG PO TB24: 5.0000 mg | ORAL_TABLET | Freq: Every day | ORAL | 1 refills | Status: DC

## 2018-05-25 MED ORDER — ENALAPRIL-HYDROCHLOROTHIAZIDE 5-12.5 MG PO TABS: 1.0000 | ORAL_TABLET | Freq: Every day | ORAL | 1 refills | Status: DC

## 2018-05-25 MED ORDER — MELOXICAM 15 MG PO TABS: 15.0000 mg | ORAL_TABLET | Freq: Every day | ORAL | 1 refills | Status: DC

## 2018-05-25 NOTE — Progress Notes (Signed)
Subjective:  Patient ID: Joshua Compton., male    DOB: October 10, 1941  Age: 77 y.o. MRN: [id number]  CC: Medical Management of Chronic Issues   HPI Eisen Robenson. presents for  follow-up of hypertension. Patient has no history of headache chest pain or shortness of breath or recent cough. Patient also denies symptoms of TIA such as focal numbness or weakness. Patient denies side effects from medication. States taking it regularly. Patient in for follow-up of elevated cholesterol. Doing well without complaints on current medication. Denies side effects of statin including myalgia and arthralgia and nausea. Also in today for liver function testing. Currently no chest pain, shortness of breath or other cardiovascular related symptoms noted.  Patient is seeing his prostate doctor periodically.  He is due for PSA.  So far no recurrence is been detected.  Patient has a significant pain in the left hip.  He is having aches and pains elsewhere.  It interferes with his ambulation.  History [name] has a past medical history of Arthritis, Cancer ([place]), Glaucoma, Hyperlipidemia, and Hypertension.   He has a past surgical history that includes Back surgery; Neck surgery; Transurethral resection of prostate; Prostate surgery; Hernia repair (11-06-2009); and Rotator cuff repair (Left, jan 2016).   His family history includes Cancer in his brother, brother, and mother; Heart disease in his father.He reports that he quit smoking about 13 years ago. He has never used smokeless tobacco. He reports that he does not drink alcohol or use drugs.  Current Outpatient Medications on File Prior to Visit  Medication Sig Dispense Refill  . dorzolamide-timolol (COSOPT) 22.3-6.8 MG/ML ophthalmic solution      No current facility-administered medications on file prior to visit.     ROS Review of Systems  Constitutional: Negative.   HENT: Negative.   Eyes: Negative for visual disturbance.  Respiratory:  Negative for cough and shortness of breath.   Cardiovascular: Negative for chest pain and leg swelling.  Gastrointestinal: Positive for abdominal pain (He reports frequent heartburn generally exacerbated by foods he gives the example of sweet potatoes being a common source.). Negative for diarrhea, nausea and vomiting.  Genitourinary: Negative for difficulty urinating.  Musculoskeletal: Positive for arthralgias. Negative for myalgias.  Skin: Negative for rash.  Neurological: Negative for headaches.  Psychiatric/Behavioral: Negative for sleep disturbance.    Objective:  BP 113/63   Pulse (!) 58   Temp (!) 97 F (36.1 C) (Oral)   Ht '5\' 11"'  (1.803 m)   Wt 200 lb (90.7 kg)   BMI 27.89 kg/m   BP Readings from Last 3 Encounters:  05/25/18 113/63  11/18/17 128/67  11/11/17 119/61    Wt Readings from Last 3 Encounters:  05/25/18 200 lb (90.7 kg)  11/18/17 203 lb (92.1 kg)  11/11/17 202 lb (91.6 kg)     Physical Exam  Constitutional: He is oriented to person, place, and time. He appears well-developed and well-nourished. No distress.  HENT:  Head: Normocephalic and atraumatic.  Right Ear: External ear normal.  Left Ear: External ear normal.  Nose: Nose normal.  Mouth/Throat: Oropharynx is clear and moist.  Eyes: Pupils are equal, round, and reactive to light. Conjunctivae and EOM are normal.  Neck: Normal range of motion. Neck supple.  Cardiovascular: Normal rate, regular rhythm and normal heart sounds.  No murmur heard. Pulmonary/Chest: Effort normal and breath sounds normal. No respiratory distress. He has no wheezes. He has no rales.  Abdominal: Soft. There is no tenderness.  Musculoskeletal: Normal range of  motion. He exhibits tenderness.  Neurological: He is alert and oriented to person, place, and time. He has normal reflexes.  Skin: Skin is warm and dry.  Psychiatric: He has a normal mood and affect. His behavior is normal. Judgment and thought content normal.       Assessment & Plan:   Garrus was seen today for medical management of chronic issues.  Diagnoses and all orders for this visit:  Essential hypertension  Thyromegaly  Mixed hyperlipidemia -     CBC with Differential/Platelet -     CMP14+EGFR -     Lipid panel  Special screening for malignant neoplasm of prostate -     PSA, total and free  Left hip pain -     DG HIP UNILAT W OR W/O PELVIS 2-3 VIEWS LEFT; Future  Arthritis  Gastroesophageal reflux disease with esophagitis  Other orders -     Enalapril-hydroCHLOROthiazide 5-12.5 MG tablet; Take 1 tablet by mouth daily. -     felodipine (PLENDIL) 5 MG 24 hr tablet; Take 1 tablet (5 mg total) by mouth daily. -     meloxicam (MOBIC) 15 MG tablet; Take 1 tablet (15 mg total) by mouth daily. -     pantoprazole (PROTONIX) 40 MG tablet; Take 1 tablet (40 mg total) by mouth daily.   Allergies as of 05/25/2018      Reactions   Pravastatin Sodium    Muscle aches      Medication List        Accurate as of 05/25/18 10:12 PM. Always use your most recent med list.          dorzolamide-timolol 22.3-6.8 MG/ML ophthalmic solution Commonly known as:  COSOPT   Enalapril-hydroCHLOROthiazide 5-12.5 MG tablet Take 1 tablet by mouth daily.   felodipine 5 MG 24 hr tablet Commonly known as:  PLENDIL Take 1 tablet (5 mg total) by mouth daily.   meloxicam 15 MG tablet Commonly known as:  MOBIC Take 1 tablet (15 mg total) by mouth daily.   pantoprazole 40 MG tablet Commonly known as:  PROTONIX Take 1 tablet (40 mg total) by mouth daily.       Meds ordered this encounter  Medications  . Enalapril-hydroCHLOROthiazide 5-12.5 MG tablet    Sig: Take 1 tablet by mouth daily.    Dispense:  90 tablet    Refill:  1  . felodipine (PLENDIL) 5 MG 24 hr tablet    Sig: Take 1 tablet (5 mg total) by mouth daily.    Dispense:  90 tablet    Refill:  1  . meloxicam (MOBIC) 15 MG tablet    Sig: Take 1 tablet (15 mg total) by mouth  daily.    Dispense:  90 tablet    Refill:  1  . pantoprazole (PROTONIX) 40 MG tablet    Sig: Take 1 tablet (40 mg total) by mouth daily.    Dispense:  90 tablet    Refill:  1    Follow-up: Return in about 6 months (around 11/25/2018).  [name], M.D.

## 2018-05-25 NOTE — Patient Instructions (Signed)
Do not use Advil!!!  Tylenol is okay.    Heart-Healthy Eating Plan Heart-healthy meal planning includes:  Limiting unhealthy fats.  Increasing healthy fats.  Making other small dietary changes.  You may need to talk with your doctor or a diet specialist (dietitian) to create an eating plan that is right for you. What types of fat should I choose?  Choose healthy fats. These include olive oil and canola oil, flaxseeds, walnuts, almonds, and seeds.  Eat more omega-3 fats. These include salmon, mackerel, sardines, tuna, flaxseed oil, and ground flaxseeds. Try to eat fish at least twice each week.  Limit saturated fats. ? Saturated fats are often found in animal products, such as meats, butter, and cream. ? Plant sources of saturated fats include palm oil, palm kernel oil, and coconut oil.  Avoid foods with partially hydrogenated oils in them. These include stick margarine, some tub margarines, cookies, crackers, and other baked goods. These contain trans fats. What general guidelines do I need to follow?  Check food labels carefully. Identify foods with trans fats or high amounts of saturated fat.  Fill one half of your plate with vegetables and green salads. Eat 4-5 servings of vegetables per day. A serving of vegetables is: ? 1 cup of raw leafy vegetables. ?  cup of raw or cooked cut-up vegetables. ?  cup of vegetable juice.  Fill one fourth of your plate with whole grains. Look for the word "whole" as the first word in the ingredient list.  Fill one fourth of your plate with lean protein foods.  Eat 4-5 servings of fruit per day. A serving of fruit is: ? One medium whole fruit. ?  cup of dried fruit. ?  cup of fresh, frozen, or canned fruit. ?  cup of 100% fruit juice.  Eat more foods that contain soluble fiber. These include apples, broccoli, carrots, beans, peas, and barley. Try to get 20-30 g of fiber per day.  Eat more home-cooked food. Eat less restaurant,  buffet, and fast food.  Limit or avoid alcohol.  Limit foods high in starch and sugar.  Avoid fried foods.  Avoid frying your food. Try baking, boiling, grilling, or broiling it instead. You can also reduce fat by: ? Removing the skin from poultry. ? Removing all visible fats from meats. ? Skimming the fat off of stews, soups, and gravies before serving them. ? Steaming vegetables in water or broth.  Lose weight if you are overweight.  Eat 4-5 servings of nuts, legumes, and seeds per week: ? One serving of dried beans or legumes equals  cup after being cooked. ? One serving of nuts equals 1 ounces. ? One serving of seeds equals  ounce or one tablespoon.  You may need to keep track of how much salt or sodium you eat. This is especially true if you have high blood pressure. Talk with your doctor or dietitian to get more information. What foods can I eat? Grains Breads, including [place], white, pita, wheat, raisin, rye, oatmeal, and [place]. Tortillas that are neither fried nor made with lard or trans fat. Low-fat rolls, including hotdog and hamburger buns and English muffins. Biscuits. Muffins. Waffles. Pancakes. Light popcorn. Whole-grain cereals. Flatbread. Melba toast. Pretzels. Breadsticks. Rusks. Low-fat snacks. Low-fat crackers, including oyster, saltine, matzo, graham, animal, and rye. Rice and pasta, including brown rice and pastas that are made with whole wheat. Vegetables All vegetables. Fruits All fruits, but limit coconut. Meats and Other Protein Sources Lean, well-trimmed beef, veal, pork,  and lamb. Chicken and [place] without skin. All fish and shellfish. Wild duck, rabbit, pheasant, and venison. Egg whites or low-cholesterol egg substitutes. Dried beans, peas, lentils, and tofu. Seeds and most nuts. Dairy Low-fat or nonfat cheeses, including ricotta, string, and mozzarella. Skim or 1% milk that is liquid, powdered, or evaporated. Buttermilk that is made with low-fat  milk. Nonfat or low-fat yogurt. Beverages Mineral water. Diet carbonated beverages. Sweets and Desserts Sherbets and fruit ices. Honey, jam, marmalade, jelly, and syrups. Meringues and gelatins. Pure sugar candy, such as hard candy, jelly beans, gumdrops, mints, marshmallows, and small amounts of dark chocolate. [employer] Eat all sweets and desserts in moderation. Fats and Oils Nonhydrogenated (trans-free) margarines. Vegetable oils, including soybean, sesame, sunflower, olive, peanut, safflower, corn, canola, and cottonseed. Salad dressings or mayonnaise made with a vegetable oil. Limit added fats and oils that you use for cooking, baking, salads, and as spreads. Other Cocoa powder. Coffee and tea. All seasonings and condiments. The items listed above may not be a complete list of recommended foods or beverages. Contact your dietitian for more options. What foods are not recommended? Grains Breads that are made with saturated or trans fats, oils, or whole milk. Croissants. Butter rolls. Cheese breads. Sweet rolls. Donuts. Buttered popcorn. Chow mein noodles. High-fat crackers, such as cheese or butter crackers. Meats and Other Protein Sources Fatty meats, such as hotdogs, short ribs, sausage, spareribs, bacon, rib eye roast or steak, and mutton. High-fat deli meats, such as salami and bologna. Caviar. Domestic duck and goose. Organ meats, such as kidney, liver, sweetbreads, and heart. Dairy Cream, sour cream, cream cheese, and creamed cottage cheese. Whole-milk cheeses, including blue (bleu), Monte[place] Jack, [place], [place], American, [place], Swiss, cheddar, [place], and [place]. Whole or 2% milk that is liquid, evaporated, or condensed. Whole buttermilk. Cream sauce or high-fat cheese sauce. Yogurt that is made from whole milk. Beverages Regular sodas and juice drinks with added sugar. Sweets and Desserts Frosting. Pudding. Cookies. Cakes other than angel food cake. Candy that has milk  chocolate or white chocolate, hydrogenated fat, butter, coconut, or unknown ingredients. Buttered syrups. Full-fat ice cream or ice cream drinks. Fats and Oils Gravy that has suet, meat fat, or shortening. Cocoa butter, hydrogenated oils, palm oil, coconut oil, palm kernel oil. These can often be found in baked products, candy, fried foods, nondairy creamers, and whipped toppings. Solid fats and shortenings, including bacon fat, salt pork, lard, and butter. Nondairy cream substitutes, such as coffee creamers and sour cream substitutes. Salad dressings that are made of unknown oils, cheese, or sour cream. The items listed above may not be a complete list of foods and beverages to avoid. Contact your dietitian for more information. This information is not intended to replace advice given to you by your health care provider. Make sure you discuss any questions you have with your health care provider. Document Released: 05/04/2012 Document Revised: 04/10/2016 Document Reviewed: 04/27/2014 Elsevier Interactive Patient Education  [employer].

## 2018-05-26 ENCOUNTER — Other Ambulatory Visit: Payer: Self-pay | Admitting: Family Medicine

## 2018-05-26 LAB — CMP14+EGFR
A/G RATIO: 1.4 (ref 1.2–2.2)
ALK PHOS: 91 IU/L (ref 39–117)
ALT: 17 IU/L (ref 0–44)
AST: 18 IU/L (ref 0–40)
Albumin: 4.4 g/dL (ref 3.5–4.8)
BUN/Creatinine Ratio: 19 (ref 10–24)
BUN: 22 mg/dL (ref 8–27)
Bilirubin Total: 0.5 mg/dL (ref 0.0–1.2)
CO2: 26 mmol/L (ref 20–29)
CREATININE: 1.18 mg/dL (ref 0.76–1.27)
Calcium: 10 mg/dL (ref 8.6–10.2)
Chloride: 100 mmol/L (ref 96–106)
GFR calc Af Amer: 68 mL/min/{1.73_m2} (ref >59)
GFR calc non Af Amer: 59 mL/min/{1.73_m2} — ABNORMAL LOW (ref >59)
GLOBULIN, TOTAL: 3.2 g/dL (ref 1.5–4.5)
Glucose: 90 mg/dL (ref 65–99)
POTASSIUM: 4.1 mmol/L (ref 3.5–5.2)
SODIUM: 140 mmol/L (ref 134–144)
Total Protein: 7.6 g/dL (ref 6.0–8.5)

## 2018-05-26 LAB — CBC WITH DIFFERENTIAL/PLATELET
Basophils Absolute: 0.1 10*3/uL (ref 0.0–0.2)
Basos: 1 %
EOS (ABSOLUTE): 0.2 10*3/uL (ref 0.0–0.4)
EOS: 4 %
HEMATOCRIT: 43.1 % (ref 37.5–51.0)
HEMOGLOBIN: 13.9 g/dL (ref 13.0–17.7)
Immature Grans (Abs): 0 10*3/uL (ref 0.0–0.1)
Immature Granulocytes: 0 %
LYMPHS ABS: 2.1 10*3/uL (ref 0.7–3.1)
Lymphs: 43 %
MCH: 30.1 pg (ref 26.6–33.0)
MCHC: 32.3 g/dL (ref 31.5–35.7)
MCV: 93 fL (ref 79–97)
MONOCYTES: 7 %
Monocytes Absolute: 0.4 10*3/uL (ref 0.1–0.9)
NEUTROS ABS: 2.2 10*3/uL (ref 1.4–7.0)
Neutrophils: 45 %
Platelets: 252 10*3/uL (ref 150–450)
RBC: 4.62 x10E6/uL (ref 4.14–5.80)
RDW: 15 % (ref 12.3–15.4)
WBC: 4.9 10*3/uL (ref 3.4–10.8)

## 2018-05-26 LAB — LIPID PANEL
CHOLESTEROL TOTAL: 216 mg/dL — AB (ref 100–199)
Chol/HDL Ratio: 4.1 ratio (ref 0.0–5.0)
HDL: 53 mg/dL (ref >39)
LDL Calculated: 145 mg/dL — ABNORMAL HIGH (ref 0–99)
TRIGLYCERIDES: 91 mg/dL (ref 0–149)
VLDL Cholesterol Cal: 18 mg/dL (ref 5–40)

## 2018-05-26 LAB — PSA, TOTAL AND FREE: Prostate Specific Ag, Serum: <0.1 ng/mL (ref 0.0–4.0)

## 2018-05-26 MED ORDER — ROSUVASTATIN CALCIUM 5 MG PO TABS: 5.0000 mg | ORAL_TABLET | Freq: Every day | ORAL | 3 refills | Status: DC

## 2018-06-01 ENCOUNTER — Encounter: Payer: Self-pay | Admitting: *Deleted

## 2018-06-01 ENCOUNTER — Ambulatory Visit (INDEPENDENT_AMBULATORY_CARE_PROVIDER_SITE_OTHER): Payer: Medicare Other | Admitting: *Deleted

## 2018-06-01 VITALS — BP 140/71 | HR 52 | Ht 71.0 in | Wt 202.0 lb

## 2018-06-01 DIAGNOSIS — Z0000 Encounter for general adult medical examination without abnormal findings: Secondary | ICD-10-CM | POA: Diagnosis not present

## 2018-06-01 NOTE — Progress Notes (Addendum)
Subjective:   Joshua Compton. is a 77 y.o. male who presents for a Medicare Annual Wellness Visit. [name]. 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 He is active playing golf twice a week and works in his garden. He has a treadmill and used to walk on it twice a day for an hour each but has not done that recently. He has four adult daughters and one adult son.    Review of Systems    Patient reports that his overall health is unchanged compared to last year.  Cardiac Risk Factors include: male gender;hypertension;sedentary lifestyle;smoking/ tobacco exposure;advanced age (>55men, >56 women)  GI: Lower abdominal pain today. Denies any dysuria or frequency or abnormal BMs.   All other systems negative       Current Medications (verified) Outpatient Encounter Medications as of 06/01/2018  Medication Sig  . dorzolamide-timolol (COSOPT) 22.3-6.8 MG/ML ophthalmic solution   . Enalapril-hydroCHLOROthiazide 5-12.5 MG tablet Take 1 tablet by mouth daily.  . felodipine (PLENDIL) 5 MG 24 hr tablet Take 1 tablet (5 mg total) by mouth daily.  . meloxicam (MOBIC) 15 MG tablet Take 1 tablet (15 mg total) by mouth daily.  . pantoprazole (PROTONIX) 40 MG tablet Take 1 tablet (40 mg total) by mouth daily.  . rosuvastatin (CRESTOR) 5 MG tablet Take 1 tablet (5 mg total) by mouth daily. For cholesterol   No facility-administered encounter medications on file as of 06/01/2018.     Allergies (verified) Pravastatin sodium   History: Past Medical History:  Diagnosis Date  . Arthritis   . Cancer [facility])    prostate ca   seed implants  . Glaucoma   . Hyperlipidemia   . Hypertension    dr Jacelyn Grip     [facility] Niece   fm   Past Surgical History:  Procedure Laterality Date  . BACK SURGERY     x2  . HERNIA REPAIR  11-06-2009   left inguinal repair   . NECK SURGERY    . PROSTATE SURGERY     prsatated cancer  . ROTATOR CUFF REPAIR Left jan 2016  . TRANSURETHRAL  RESECTION OF PROSTATE     Family History  Problem Relation Age of Onset  . Cancer Mother        breast  . Heart disease Father   . Cancer Brother   . Cancer Brother    Social History   Socioeconomic History  . Marital status: Married    Spouse name: Not on file  . Number of children: 5  . Years of education: 102  . Highest education level: 11th grade  Occupational History  . Occupation: Artist: retired  Scientific laboratory technician  . Financial resource strain: Not hard at all  . Food insecurity:    Worry: Never true    Inability: Never true  . Transportation needs:    Medical: No    Non-medical: No  Tobacco Use  . Smoking status: Former Smoker    Packs/day: 0.50    Years: 30.00    Pack years: 15.00    Types: Cigarettes    Last attempt to quit: 03/28/2005    Years since quitting: 13.1  . Smokeless tobacco: Never Used  Substance and Sexual Activity  . Alcohol use: No    Alcohol/week: 0.0 oz  . Drug use: No  . Sexual activity: Yes  Lifestyle  . Physical activity:    Days per week: 0 days  Minutes per session: 0 min  . Stress: Not at all  Relationships  . Social connections:    Talks on phone: More than three times a week    Gets together: More than three times a week    Attends religious service: More than 4 times per year    Active member of club or organization: Yes    Attends meetings of clubs or organizations: More than 4 times per year    Relationship status: Married  Other Topics Concern  . Not on file  Social History Narrative   ** Merged History Encounter **        Tobacco Use No.  Clinical Intake:     Pain : No/denies pain     Nutritional Status: BMI 25 -29 Overweight Diabetes: No  How often do you need to have someone help you when you read instructions, pamphlets, or other written materials from your doctor or pharmacy?: 1 - Never What is the last grade level you completed in school?: 11     Information entered by ::  Chong Sicilian, RN  Activities of Daily Living In your present state of health, do you have any difficulty performing the following activities: 06/01/2018  Hearing? N  Comment Has some difficulty with hearing recenlty, especially when people talk softly  Vision? N  Comment Has routine eye exams. Sees Dr every 6 months for glaucoma  Difficulty concentrating or making decisions? N  Walking or climbing stairs? N  Dressing or bathing? N  Doing errands, shopping? N  Preparing Food and eating ? N  Using the Toilet? N  In the past six months, have you accidently leaked urine? N  Do you have problems with loss of bowel control? N  Managing your Medications? N  Managing your Finances? N  Housekeeping or managing your Housekeeping? N  Some recent data might be hidden     Diet 2 cups of coffee for breakfast, snack at lunch, and eats supper. Mostly at home but eats out occassionally.  Drinks lemonade and water  Exercise Current Exercise Habits: The patient does not participate in regular exercise at present(works in garden and golfs twice a week. does not exercise routinely now but used to), Exercise limited by: None identified    Depression Screen PHQ 2/9 Scores 06/01/2018 05/25/2018 11/18/2017 11/11/2017  PHQ - 2 Score 0 0 0 0     Fall Risk Fall Risk  06/01/2018 05/25/2018 11/18/2017 11/11/2017 05/19/2017  Falls in the past year? No No No No No    Safety Is the patient's home free of loose throw rugs in walkways, pet beds, electrical cords, etc?   no      Grab bars in the bathroom? no      Walkin shower? yes      Shower Seat? no      Handrails on the stairs?   yes      Adequate lighting?   yes  Patient Care Team: [name], MD as PCP - General (Family Medicine) Juanita Craver, MD as Consulting Physician (Gastroenterology) Jovita Gamma, MD as Consulting Physician (Neurosurgery) Irine Seal, MD as Attending Physician (Urology) Monna Fam, MD as Consulting Physician  (Ophthalmology) Irine Seal, MD as Attending Physician (Urology)  Hospitalizations, surgeries, and ER visits in previous 12 months No hospitalizations, ER visits, or surgeries this past year.  Objective:    Today's Vitals   06/01/18 1521  BP: 140/71  Pulse: (!) 52  Weight: 202 lb (91.6 kg)  Height: 5\' 11"  (  1.803 m)   Body mass index is 28.17 kg/m.  Advanced Directives 06/01/2018 03/29/2015 02/06/2015 01/23/2015 11/14/2014 06/09/2012 06/03/2012  Does Patient Have a Medical Advance Directive? No No No No No Patient does not have advance directive;Patient would like information -  Would patient like information on creating a medical advance directive? No - Patient declined Yes - Scientist, clinical (histocompatibility and immunogenetics) given - - No - patient declined information Advance directive packet given -  Pre-existing out of facility DNR order (yellow form or pink MOST form) - - - - - - No    Hearing/Vision  normal or No deficits noted during visit.   Cognitive Function: MMSE - Mini Mental State Exam 06/01/2018 03/29/2015  Orientation to time 5 5  Orientation to Place 4 5  Registration 3 3  Attention/ Calculation 4 5  Recall 2 2  Language- name 2 objects 2 2  Language- repeat 1 1  Language- follow 3 step command 3 3  Language- read & follow direction 1 1  Write a sentence 1 1  Copy design 1 1  Total score 27 29       Normal Cognitive Function Screening: Yes    Immunizations and Health Maintenance Immunization History  Administered Date(s) Administered  . Influenza, High Dose Seasonal PF 08/15/2014, 10/13/2016, 09/10/2017  . Influenza,inj,Quad PF,6+ Mos 09/19/2013  . Pneumococcal Conjugate-13 08/15/2014  . Pneumococcal Polysaccharide-23 07/19/2011  . Tdap 07/19/2011   There are no preventive care reminders to display for this patient. Health Maintenance  Topic Date Due  . INFLUENZA VACCINE  06/17/2018  . TETANUS/TDAP  07/18/2021  . PNA vac Low Risk Adult  Completed        Assessment:   This  is a routine wellness examination for Maleik.      Plan:    Goals    . Exercise 150 min/wk Moderate Activity        Health Maintenance Recommendations: No recommendations today  Additional Screening Recommendations: Lung: Low Dose CT Chest recommended if Age 68-80 years, 30 pack-year currently smoking OR have quit w/in 15years. Patient does not qualify. Hepatitis C Screening recommended: no  Today's Orders No orders of the defined types were placed in this encounter.   Keep f/u with [name], MD and any other specialty appointments you may have Continue current medications Move carefully to avoid falls. Use assistive devices like a cane or walker if needed. Resume exercise. Aim for at least 150 minutes of moderate activity a week. Using the treadmill indoors during the summer months is a good way to exercise and avoid the heat.  Reading or puzzles are a good way to exercise your brain Stay connected with friends and family. Social connections are beneficial to your emotional and mental health.   I have personally reviewed and noted the following in the patient's chart:   . Medical and social history . Use of alcohol, tobacco or illicit drugs  . Current medications and supplements . Functional ability and status . Nutritional status . Physical activity . Advanced directives . List of other physicians . Hospitalizations, surgeries, and ER visits in previous 12 months . Vitals . Screenings to include cognitive, depression, and falls . Referrals and appointments  In addition, I have reviewed and discussed with patient certain preventive protocols, quality metrics, and best practice recommendations. A written personalized care plan for preventive services as well as general preventive health recommendations were provided to patient.     Chong Sicilian, RN   06/01/2018  I have reviewed and agree with the above AWV documentation.  [name], M.D.

## 2018-06-01 NOTE — Patient Instructions (Addendum)
  Joshua Compton , Thank you for taking time to come for your Medicare Wellness Visit. I appreciate your ongoing commitment to your health goals. Please review the following plan we discussed and let me know if I can assist you in the future.   These are the goals we discussed: Goals    . Exercise 150 min/wk Moderate Activity       This is a list of the screening recommended for you and due dates:  Health Maintenance  Topic Date Due  . Flu Shot  06/17/2018  . Tetanus Vaccine  07/18/2021  . Pneumonia vaccines  Completed

## 2018-10-19 ENCOUNTER — Ambulatory Visit (INDEPENDENT_AMBULATORY_CARE_PROVIDER_SITE_OTHER): Payer: Medicare Other | Admitting: *Deleted

## 2018-10-19 DIAGNOSIS — Z23 Encounter for immunization: Secondary | ICD-10-CM | POA: Diagnosis not present

## 2018-10-28 DIAGNOSIS — H401112 Primary open-angle glaucoma, right eye, moderate stage: Secondary | ICD-10-CM | POA: Diagnosis not present

## 2018-10-28 DIAGNOSIS — H2513 Age-related nuclear cataract, bilateral: Secondary | ICD-10-CM | POA: Diagnosis not present

## 2018-10-28 DIAGNOSIS — H40052 Ocular hypertension, left eye: Secondary | ICD-10-CM | POA: Diagnosis not present

## 2018-10-28 DIAGNOSIS — H401123 Primary open-angle glaucoma, left eye, severe stage: Secondary | ICD-10-CM | POA: Diagnosis not present

## 2018-11-04 ENCOUNTER — Other Ambulatory Visit: Payer: Self-pay | Admitting: Family Medicine

## 2018-11-23 ENCOUNTER — Ambulatory Visit (INDEPENDENT_AMBULATORY_CARE_PROVIDER_SITE_OTHER): Payer: Medicare Other | Admitting: Family Medicine

## 2018-11-23 ENCOUNTER — Telehealth: Payer: Self-pay | Admitting: Family Medicine

## 2018-11-23 ENCOUNTER — Encounter: Payer: Self-pay | Admitting: Family Medicine

## 2018-11-23 VITALS — BP 176/80 | HR 62 | Temp 97.4°F | Ht 71.0 in | Wt 202.2 lb

## 2018-11-23 DIAGNOSIS — J40 Bronchitis, not specified as acute or chronic: Secondary | ICD-10-CM

## 2018-11-23 MED ORDER — PANTOPRAZOLE SODIUM 40 MG PO TBEC: 40.0000 mg | DELAYED_RELEASE_TABLET | Freq: Every day | ORAL | 0 refills | Status: DC

## 2018-11-23 MED ORDER — FELODIPINE ER 5 MG PO TB24: 5.0000 mg | ORAL_TABLET | Freq: Every day | ORAL | 1 refills | Status: DC

## 2018-11-23 MED ORDER — FLUTICASONE PROPIONATE 50 MCG/ACT NA SUSP: 1.0000 | Freq: Two times a day (BID) | NASAL | 6 refills | Status: DC | PRN

## 2018-11-23 MED ORDER — ALBUTEROL SULFATE HFA 108 (90 BASE) MCG/ACT IN AERS: 2.0000 | INHALATION_SPRAY | Freq: Four times a day (QID) | RESPIRATORY_TRACT | 0 refills | Status: DC | PRN

## 2018-11-23 MED ORDER — ROSUVASTATIN CALCIUM 5 MG PO TABS: 5.0000 mg | ORAL_TABLET | Freq: Every day | ORAL | 3 refills | Status: DC

## 2018-11-23 MED ORDER — MELOXICAM 15 MG PO TABS: 15.0000 mg | ORAL_TABLET | Freq: Every day | ORAL | 0 refills | Status: DC

## 2018-11-23 MED ORDER — HYDROCODONE-HOMATROPINE 5-1.5 MG/5ML PO SYRP: 5.0000 mL | ORAL_SOLUTION | Freq: Four times a day (QID) | ORAL | 0 refills | Status: DC | PRN

## 2018-11-23 NOTE — Progress Notes (Signed)
BP (!) 176/80   Pulse 62   Temp (!) 97.4 F (36.3 C) (Oral)   Ht 5\' 11"  (1.803 m)   Wt 202 lb 3.2 oz (91.7 kg)   SpO2 94%   BMI 28.20 kg/m    Subjective:    Patient ID: Joshua Guiles., male    DOB: 01-27-1941, 78 y.o.   MRN: [id number]  HPI: Joshua Moga. is a 78 y.o. male presenting on 11/23/2018 for Cough (x 2 weeks) and Nasal Congestion   HPI Cough and congestion Patient comes in today complaining of cough and chest congestion is been going on for couple weeks.  He says that he gets like this usually about once a year and this time it started just after Christmas and he feels like it has gotten down into his chest and is just not able to clear it.  He denies any fevers or chills or shortness of breath or wheezing.  He denies any worsening but is just not improving.  His cough will especially keep him up at night and then he goes into these coughing spells.  He feels like it is like glue in his chest and he just cannot clear it up.  He has tried Mucinex but did not get any relief from Mucinex.  He has tried a sinus and cold medication without any relief as well.  He says he usually gets like this until he finally comes to the doctor and is treated usually with a cough medicine and then it improves.  He does admit to having a smoking history but not currently  Relevant past medical, surgical, family and social history reviewed and updated as indicated. Interim medical history since our last visit reviewed. Allergies and medications reviewed and updated.  Review of Systems  Constitutional: Negative for chills and fever.  HENT: Positive for congestion, postnasal drip, rhinorrhea and sore throat. Negative for ear discharge, ear pain, sinus pressure, sneezing and voice change.   Eyes: Negative for pain, discharge, redness and visual disturbance.  Respiratory: Positive for cough. Negative for shortness of breath and wheezing.   Cardiovascular: Negative for chest pain and leg  swelling.  Musculoskeletal: Negative for gait problem.  Skin: Negative for rash.  All other systems reviewed and are negative.   Per HPI unless specifically indicated above   Allergies as of 11/23/2018      Reactions   Pravastatin Sodium    Muscle aches      Medication List       Accurate as of November 23, 2018  4:21 PM. Always use your most recent med list.        albuterol 108 (90 Base) MCG/ACT inhaler Commonly known as:  PROVENTIL HFA;VENTOLIN HFA Inhale 2 puffs into the lungs every 6 (six) hours as needed for wheezing or shortness of breath.   dorzolamide-timolol 22.3-6.8 MG/ML ophthalmic solution Commonly known as:  COSOPT   Enalapril-hydroCHLOROthiazide 5-12.5 MG tablet Take 1 tablet by mouth daily.   felodipine 5 MG 24 hr tablet Commonly known as:  PLENDIL Take 1 tablet (5 mg total) by mouth daily.   fluticasone 50 MCG/ACT nasal spray Commonly known as:  FLONASE Place 1 spray into both nostrils 2 (two) times daily as needed for allergies or rhinitis.   HYDROcodone-homatropine 5-1.5 MG/5ML syrup Commonly known as:  HYCODAN Take 5 mLs by mouth every 6 (six) hours as needed for cough.   meloxicam 15 MG tablet Commonly known as:  MOBIC Take 1 tablet (15 mg  total) by mouth daily.   pantoprazole 40 MG tablet Commonly known as:  PROTONIX Take 1 tablet (40 mg total) by mouth daily.   rosuvastatin 5 MG tablet Commonly known as:  CRESTOR Take 1 tablet (5 mg total) by mouth daily. For cholesterol          Objective:    BP (!) 176/80   Pulse 62   Temp (!) 97.4 F (36.3 C) (Oral)   Ht 5\' 11"  (1.803 m)   Wt 202 lb 3.2 oz (91.7 kg)   SpO2 94%   BMI 28.20 kg/m   Wt Readings from Last 3 Encounters:  11/23/18 202 lb 3.2 oz (91.7 kg)  06/01/18 202 lb (91.6 kg)  05/25/18 200 lb (90.7 kg)    Physical Exam Vitals signs and nursing note reviewed.  Constitutional:      General: He is not in acute distress.    Appearance: He is well-developed. He is not  diaphoretic.  HENT:     Right Ear: Tympanic membrane, ear canal and external ear normal.     Left Ear: Tympanic membrane, ear canal and external ear normal.     Nose: Mucosal edema and rhinorrhea present.     Right Sinus: Maxillary sinus tenderness present. No frontal sinus tenderness.     Left Sinus: Maxillary sinus tenderness present. No frontal sinus tenderness.     Mouth/Throat:     Pharynx: Uvula midline. No oropharyngeal exudate or posterior oropharyngeal erythema.     Tonsils: No tonsillar abscesses.  Eyes:     General: No scleral icterus.       Right eye: No discharge.     Conjunctiva/sclera: Conjunctivae normal.     Pupils: Pupils are equal, round, and reactive to light.  Neck:     Musculoskeletal: Neck supple.     Thyroid: No thyromegaly.  Cardiovascular:     Rate and Rhythm: Normal rate and regular rhythm.     Heart sounds: Normal heart sounds. No murmur.  Pulmonary:     Effort: Pulmonary effort is normal. No respiratory distress.     Breath sounds: Rhonchi (Rhonchi throughout) present. No wheezing or rales.  Musculoskeletal: Normal range of motion.  Lymphadenopathy:     Cervical: No cervical adenopathy.  Skin:    General: Skin is warm and dry.     Findings: No rash.  Neurological:     Mental Status: He is alert and oriented to person, place, and time.     Coordination: Coordination normal.  Psychiatric:        Behavior: Behavior normal.         Assessment & Plan:   Problem List Items Addressed This Visit    None    Visit Diagnoses    Bronchitis    -  Primary   Relevant Medications   HYDROcodone-homatropine (HYCODAN) 5-1.5 MG/5ML syrup   fluticasone (FLONASE) 50 MCG/ACT nasal spray   albuterol (PROVENTIL HFA;VENTOLIN HFA) 108 (90 Base) MCG/ACT inhaler       Follow up plan: Return if symptoms worsen or fail to improve.  Counseling provided for all of the vaccine components No orders of the defined types were placed in this encounter.   Caryl Pina, MD [facility] 11/23/2018, 4:21 PM

## 2018-11-23 NOTE — Telephone Encounter (Signed)
PT is scheduled to see Dr Livia Snellen on 1/20 for 6 month recheck pt is out of medicine can we send in refills until his apt? felodipine (PLENDIL) 5 MG 24 hr tablet pantoprazole (PROTONIX) 40 MG tablet rosuvastatin (CRESTOR) 5 MG tablet meloxicam (MOBIC) 15 MG tablet   [place]

## 2018-11-23 NOTE — Telephone Encounter (Signed)
meds refilled at [facility]

## 2018-11-26 ENCOUNTER — Ambulatory Visit: Payer: Medicare Other | Admitting: Family Medicine

## 2018-11-26 DIAGNOSIS — H401123 Primary open-angle glaucoma, left eye, severe stage: Secondary | ICD-10-CM | POA: Diagnosis not present

## 2018-11-26 DIAGNOSIS — H2512 Age-related nuclear cataract, left eye: Secondary | ICD-10-CM | POA: Diagnosis not present

## 2018-11-26 DIAGNOSIS — H401112 Primary open-angle glaucoma, right eye, moderate stage: Secondary | ICD-10-CM | POA: Diagnosis not present

## 2018-11-26 DIAGNOSIS — H2513 Age-related nuclear cataract, bilateral: Secondary | ICD-10-CM | POA: Diagnosis not present

## 2018-11-26 DIAGNOSIS — H25013 Cortical age-related cataract, bilateral: Secondary | ICD-10-CM | POA: Diagnosis not present

## 2018-12-06 ENCOUNTER — Encounter: Payer: Self-pay | Admitting: Family Medicine

## 2018-12-06 ENCOUNTER — Ambulatory Visit (INDEPENDENT_AMBULATORY_CARE_PROVIDER_SITE_OTHER): Payer: Medicare Other | Admitting: Family Medicine

## 2018-12-06 VITALS — BP 136/60 | HR 66 | Temp 97.0°F | Ht 71.0 in | Wt 202.4 lb

## 2018-12-06 DIAGNOSIS — I10 Essential (primary) hypertension: Secondary | ICD-10-CM | POA: Diagnosis not present

## 2018-12-06 DIAGNOSIS — D508 Other iron deficiency anemias: Secondary | ICD-10-CM

## 2018-12-06 DIAGNOSIS — J40 Bronchitis, not specified as acute or chronic: Secondary | ICD-10-CM

## 2018-12-06 DIAGNOSIS — E782 Mixed hyperlipidemia: Secondary | ICD-10-CM

## 2018-12-06 MED ORDER — PANTOPRAZOLE SODIUM 40 MG PO TBEC: 40.0000 mg | DELAYED_RELEASE_TABLET | Freq: Every day | ORAL | 1 refills | Status: DC

## 2018-12-06 MED ORDER — FLUTICASONE PROPIONATE 50 MCG/ACT NA SUSP: 1.0000 | Freq: Two times a day (BID) | NASAL | 6 refills | Status: DC | PRN

## 2018-12-06 MED ORDER — ALBUTEROL SULFATE HFA 108 (90 BASE) MCG/ACT IN AERS: 2.0000 | INHALATION_SPRAY | Freq: Four times a day (QID) | RESPIRATORY_TRACT | 5 refills | Status: DC | PRN

## 2018-12-06 MED ORDER — ENALAPRIL-HYDROCHLOROTHIAZIDE 5-12.5 MG PO TABS: 1.0000 | ORAL_TABLET | Freq: Every day | ORAL | 1 refills | Status: DC

## 2018-12-06 MED ORDER — MELOXICAM 15 MG PO TABS: 15.0000 mg | ORAL_TABLET | Freq: Every day | ORAL | 1 refills | Status: DC

## 2018-12-06 NOTE — Progress Notes (Signed)
Subjective:  Patient ID: Joshua Guiles., male    DOB: 1941-08-15  Age: 78 y.o. MRN: [id number]  CC: Medical Management of Chronic Issues   HPI Joshua Compton. presents for  follow-up of hypertension. Patient has no history of headache chest pain or shortness of breath or recent cough. Patient also denies symptoms of TIA such as focal numbness or weakness. Patient denies side effects from medication. States taking it regularly. Patient in for follow-up of elevated cholesterol. Doing well without complaints on current medication. Denies side effects of statin including myalgia and arthralgia and nausea. Also in today for liver function testing. Currently no chest pain, shortness of breath or other cardiovascular related symptoms noted.  Patient also has a history of cataract and he is having that repaired surgically in 2 days.  Patient also requesting handicap sticker.  This is due to his arthritis and difficulty walking extended distances.  He does get short of breath occasionally.  He could not afford his inhalers this month.  He only uses it as needed. History [place] has a past medical history of Arthritis, Cancer ([place]), Glaucoma, Hyperlipidemia, and Hypertension.   He has a past surgical history that includes Back surgery; Neck surgery; Transurethral resection of prostate; Prostate surgery; Hernia repair (11-06-2009); and Rotator cuff repair (Left, jan 2016).   His family history includes Cancer in his brother, brother, and mother; Heart disease in his father.He reports that he quit smoking about 13 years ago. His smoking use included cigarettes. He has a 15.00 pack-year smoking history. He has never used smokeless tobacco. He reports that he does not drink alcohol or use drugs.  Current Outpatient Medications on File Prior to Visit  Medication Sig Dispense Refill  . dorzolamide-timolol (COSOPT) 22.3-6.8 MG/ML ophthalmic solution     . felodipine (PLENDIL) 5 MG 24 hr tablet  Take 1 tablet (5 mg total) by mouth daily. 90 tablet 1  . rosuvastatin (CRESTOR) 5 MG tablet Take 1 tablet (5 mg total) by mouth daily. For cholesterol 90 tablet 3   No current facility-administered medications on file prior to visit.     ROS Review of Systems  Constitutional: Negative.  Negative for activity change, appetite change, chills and fever.  HENT: Positive for congestion and postnasal drip. Negative for ear discharge, ear pain, hearing loss, nosebleeds, rhinorrhea, sinus pressure, sneezing and trouble swallowing.   Eyes: Negative for visual disturbance.  Respiratory: Negative for cough, chest tightness and shortness of breath.   Cardiovascular: Negative for chest pain, palpitations and leg swelling.  Gastrointestinal: Negative for abdominal pain, diarrhea, nausea and vomiting.  Genitourinary: Negative for difficulty urinating.  Musculoskeletal: Negative for arthralgias and myalgias.  Skin: Negative for rash.  Neurological: Negative for headaches.  Psychiatric/Behavioral: Negative for sleep disturbance.    Objective:  BP 136/60   Pulse 66   Temp (!) 97 F (36.1 C) (Oral)   Ht _0  (1.803 m)   Wt 202 lb 6 oz (91.8 kg)   BMI 28.23 kg/m   BP Readings from Last 3 Encounters:  12/06/18 136/60  11/23/18 (!) 176/80  06/01/18 140/71    Wt Readings from Last 3 Encounters:  12/06/18 202 lb 6 oz (91.8 kg)  11/23/18 202 lb 3.2 oz (91.7 kg)  06/01/18 202 lb (91.6 kg)     Physical Exam Constitutional:      General: He is not in acute distress.    Appearance: He is well-developed.  HENT:     Head: Normocephalic and atraumatic.  Right Ear: External ear normal.     Left Ear: External ear normal.     Nose: Nose normal.  Eyes:     Conjunctiva/sclera: Conjunctivae normal.     Pupils: Pupils are equal, round, and reactive to light.  Neck:     Musculoskeletal: Normal range of motion and neck supple.  Cardiovascular:     Rate and Rhythm: Normal rate and regular  rhythm.     Heart sounds: Normal heart sounds. No murmur.  Pulmonary:     Effort: Pulmonary effort is normal. No respiratory distress.     Breath sounds: Normal breath sounds. No wheezing or rales.  Abdominal:     Palpations: Abdomen is soft.     Tenderness: There is no abdominal tenderness.  Musculoskeletal: Normal range of motion.  Skin:    General: Skin is warm and dry.  Neurological:     Mental Status: He is alert and oriented to person, place, and time.     Deep Tendon Reflexes: Reflexes are normal and symmetric.  Psychiatric:        Behavior: Behavior normal.        Thought Content: Thought content normal.        Judgment: Judgment normal.       Assessment & Plan:   Joshua Compton was seen today for medical management of chronic issues.  Diagnoses and all orders for this visit:  Mixed hyperlipidemia -     CBC with Differential/Platelet -     CMP14+EGFR -     Lipid panel  Bronchitis -     albuterol (PROVENTIL HFA;VENTOLIN HFA) 108 (90 Base) MCG/ACT inhaler; Inhale 2 puffs into the lungs every 6 (six) hours as needed for wheezing or shortness of breath. -     fluticasone (FLONASE) 50 MCG/ACT nasal spray; Place 1 spray into both nostrils 2 (two) times daily as needed for allergies or rhinitis.  Essential hypertension  Other iron deficiency anemia  Other orders -     Enalapril-hydroCHLOROthiazide 5-12.5 MG tablet; Take 1 tablet by mouth daily. -     meloxicam (MOBIC) 15 MG tablet; Take 1 tablet (15 mg total) by mouth daily. -     pantoprazole (PROTONIX) 40 MG tablet; Take 1 tablet (40 mg total) by mouth daily.   Allergies as of 12/06/2018      Reactions   Pravastatin Sodium    Muscle aches      Medication List       Accurate as of December 06, 2018  4:13 PM. Always use your most recent med list.        albuterol 108 (90 Base) MCG/ACT inhaler Commonly known as:  PROVENTIL HFA;VENTOLIN HFA Inhale 2 puffs into the lungs every 6 (six) hours as needed for wheezing  or shortness of breath.   dorzolamide-timolol 22.3-6.8 MG/ML ophthalmic solution Commonly known as:  COSOPT   Enalapril-hydroCHLOROthiazide 5-12.5 MG tablet Take 1 tablet by mouth daily.   felodipine 5 MG 24 hr tablet Commonly known as:  PLENDIL Take 1 tablet (5 mg total) by mouth daily.   fluticasone 50 MCG/ACT nasal spray Commonly known as:  FLONASE Place 1 spray into both nostrils 2 (two) times daily as needed for allergies or rhinitis.   meloxicam 15 MG tablet Commonly known as:  MOBIC Take 1 tablet (15 mg total) by mouth daily.   pantoprazole 40 MG tablet Commonly known as:  PROTONIX Take 1 tablet (40 mg total) by mouth daily.   rosuvastatin 5 MG tablet Commonly known  as:  CRESTOR Take 1 tablet (5 mg total) by mouth daily. For cholesterol       Meds ordered this encounter  Medications  . albuterol (PROVENTIL HFA;VENTOLIN HFA) 108 (90 Base) MCG/ACT inhaler    Sig: Inhale 2 puffs into the lungs every 6 (six) hours as needed for wheezing or shortness of breath.    Dispense:  1 Inhaler    Refill:  5  . fluticasone (FLONASE) 50 MCG/ACT nasal spray    Sig: Place 1 spray into both nostrils 2 (two) times daily as needed for allergies or rhinitis.    Dispense:  16 g    Refill:  6  . Enalapril-hydroCHLOROthiazide 5-12.5 MG tablet    Sig: Take 1 tablet by mouth daily.    Dispense:  90 tablet    Refill:  1  . meloxicam (MOBIC) 15 MG tablet    Sig: Take 1 tablet (15 mg total) by mouth daily.    Dispense:  90 tablet    Refill:  1  . pantoprazole (PROTONIX) 40 MG tablet    Sig: Take 1 tablet (40 mg total) by mouth daily.    Dispense:  90 tablet    Refill:  1   Handicap sticker completed for the patient. Follow-up: Return in about 6 months (around 06/06/2019) for hypertension, cholesterol.  [name], M.D.

## 2018-12-07 ENCOUNTER — Other Ambulatory Visit: Payer: Medicare Other

## 2018-12-07 DIAGNOSIS — E782 Mixed hyperlipidemia: Secondary | ICD-10-CM | POA: Diagnosis not present

## 2018-12-07 LAB — LIPID PANEL
Chol/HDL Ratio: 2.4 ratio (ref 0.0–5.0)
Cholesterol, Total: 140 mg/dL (ref 100–199)
HDL: 59 mg/dL (ref >39)
LDL Calculated: 67 mg/dL (ref 0–99)
Triglycerides: 71 mg/dL (ref 0–149)
VLDL Cholesterol Cal: 14 mg/dL (ref 5–40)

## 2018-12-07 LAB — CBC WITH DIFFERENTIAL/PLATELET
Basophils Absolute: 0.1 10*3/uL (ref 0.0–0.2)
Basos: 2 %
EOS (ABSOLUTE): 0.3 10*3/uL (ref 0.0–0.4)
Eos: 5 %
Hematocrit: 43.7 % (ref 37.5–51.0)
Hemoglobin: 14.4 g/dL (ref 13.0–17.7)
Immature Grans (Abs): 0 10*3/uL (ref 0.0–0.1)
Immature Granulocytes: 0 %
Lymphocytes Absolute: 2.2 10*3/uL (ref 0.7–3.1)
Lymphs: 37 %
MCH: 30.6 pg (ref 26.6–33.0)
MCHC: 33 g/dL (ref 31.5–35.7)
MCV: 93 fL (ref 79–97)
Monocytes Absolute: 0.6 10*3/uL (ref 0.1–0.9)
Monocytes: 10 %
Neutrophils Absolute: 2.9 10*3/uL (ref 1.4–7.0)
Neutrophils: 46 %
PLATELETS: 271 10*3/uL (ref 150–450)
RBC: 4.7 x10E6/uL (ref 4.14–5.80)
RDW: 13 % (ref 11.6–15.4)
WBC: 6.1 10*3/uL (ref 3.4–10.8)

## 2018-12-07 LAB — CMP14+EGFR
A/G RATIO: 1.5 (ref 1.2–2.2)
ALT: 19 IU/L (ref 0–44)
AST: 20 IU/L (ref 0–40)
Albumin: 4.2 g/dL (ref 3.7–4.7)
Alkaline Phosphatase: 102 IU/L (ref 39–117)
BUN/Creatinine Ratio: 17 (ref 10–24)
BUN: 20 mg/dL (ref 8–27)
Bilirubin Total: 0.6 mg/dL (ref 0.0–1.2)
CO2: 26 mmol/L (ref 20–29)
Calcium: 10.2 mg/dL (ref 8.6–10.2)
Chloride: 101 mmol/L (ref 96–106)
Creatinine, Ser: 1.17 mg/dL (ref 0.76–1.27)
GFR calc Af Amer: 69 mL/min/{1.73_m2} (ref >59)
GFR calc non Af Amer: 60 mL/min/{1.73_m2} (ref >59)
Globulin, Total: 2.8 g/dL (ref 1.5–4.5)
Glucose: 91 mg/dL (ref 65–99)
Potassium: 4.6 mmol/L (ref 3.5–5.2)
Sodium: 143 mmol/L (ref 134–144)
Total Protein: 7 g/dL (ref 6.0–8.5)

## 2018-12-08 DIAGNOSIS — H2512 Age-related nuclear cataract, left eye: Secondary | ICD-10-CM | POA: Diagnosis not present

## 2018-12-08 DIAGNOSIS — H401122 Primary open-angle glaucoma, left eye, moderate stage: Secondary | ICD-10-CM | POA: Diagnosis not present

## 2018-12-08 DIAGNOSIS — H25812 Combined forms of age-related cataract, left eye: Secondary | ICD-10-CM | POA: Diagnosis not present

## 2018-12-08 DIAGNOSIS — H401123 Primary open-angle glaucoma, left eye, severe stage: Secondary | ICD-10-CM | POA: Diagnosis not present

## 2018-12-08 DIAGNOSIS — H5703 Miosis: Secondary | ICD-10-CM | POA: Diagnosis not present

## 2018-12-08 NOTE — Progress Notes (Signed)
Hello Bladimir,  Your lab result is normal.Some minor variations that are not significant are commonly marked abnormal, but do not represent any medical problem for you.  Best regards, [name], M.D.

## 2018-12-17 ENCOUNTER — Encounter: Payer: Self-pay | Admitting: *Deleted

## 2019-01-31 ENCOUNTER — Other Ambulatory Visit: Payer: Self-pay | Admitting: Podiatry

## 2019-01-31 ENCOUNTER — Ambulatory Visit: Payer: Medicare Other | Admitting: Podiatry

## 2019-01-31 ENCOUNTER — Ambulatory Visit (INDEPENDENT_AMBULATORY_CARE_PROVIDER_SITE_OTHER): Payer: Medicare Other

## 2019-01-31 ENCOUNTER — Encounter: Payer: Self-pay | Admitting: Podiatry

## 2019-01-31 ENCOUNTER — Other Ambulatory Visit: Payer: Self-pay

## 2019-01-31 VITALS — BP 161/80 | HR 54

## 2019-01-31 DIAGNOSIS — M722 Plantar fascial fibromatosis: Secondary | ICD-10-CM

## 2019-01-31 DIAGNOSIS — M79671 Pain in right foot: Secondary | ICD-10-CM

## 2019-01-31 MED ORDER — MELOXICAM 15 MG PO TABS: 15.0000 mg | ORAL_TABLET | Freq: Every day | ORAL | 1 refills | Status: DC

## 2019-02-02 NOTE — Progress Notes (Signed)
   Subjective: 78 year old male presenting today as a new patient with a chief complaint of throbbing, sharp pain of the posterior and plantar right heel that began 2-3 weeks ago. He reports associated tightness of the heel in the morning time. Walking and flexing the foot increases the pain. He has not done anything for treatment. Patient is here for further evaluation and treatment.   Past Medical History:  Diagnosis Date  . Arthritis   . Cancer [facility])    prostate ca   seed implants  . Glaucoma   . Hyperlipidemia   . Hypertension    dr Jacelyn Grip     [facility] Niece   fm     Objective: Physical Exam General: The patient is alert and oriented x3 in no acute distress.  Dermatology: Skin is warm, dry and supple bilateral lower extremities. Negative for open lesions or macerations bilateral.   Vascular: Dorsalis Pedis and Posterior Tibial pulses palpable bilateral.  Capillary fill time is immediate to all digits.  Neurological: Epicritic and protective threshold intact bilateral.   Musculoskeletal: Tenderness to palpation to the plantar aspect of the right heel along the plantar fascia. All other joints range of motion within normal limits bilateral. Strength 5/5 in all groups bilateral.   Radiographic exam: Normal osseous mineralization. Joint spaces preserved. No fracture/dislocation/boney destruction. No other soft tissue abnormalities or radiopaque foreign bodies.   Assessment: 1. Plantar fasciitis right  Plan of Care:  1. Patient evaluated. Xrays reviewed.   2. Injection of 0.5cc Celestone soluspan injected into the right plantar fascia  3. Rx for Meloxicam ordered for patient. 4. Plantar fascial band(s) dispensed 5. Instructed patient regarding therapies and modalities at home to alleviate symptoms.  6. Recommended good shoe gear.  7. Return to clinic in 4 weeks.     Edrick Kins, DPM [facility]  Dr. Edrick Kins, DPM    [address], [place] [address]                Office [phone]  Fax [phone]

## 2019-03-02 ENCOUNTER — Ambulatory Visit: Payer: Medicare Other | Admitting: Podiatry

## 2019-03-05 ENCOUNTER — Ambulatory Visit: Payer: Medicare Other | Admitting: Podiatry

## 2019-04-07 ENCOUNTER — Telehealth: Payer: Self-pay | Admitting: Family Medicine

## 2019-04-07 ENCOUNTER — Ambulatory Visit (INDEPENDENT_AMBULATORY_CARE_PROVIDER_SITE_OTHER): Payer: Medicare Other | Admitting: Family

## 2019-04-07 ENCOUNTER — Encounter: Payer: Self-pay | Admitting: Family

## 2019-04-07 ENCOUNTER — Other Ambulatory Visit: Payer: Self-pay

## 2019-04-07 DIAGNOSIS — J40 Bronchitis, not specified as acute or chronic: Secondary | ICD-10-CM | POA: Diagnosis not present

## 2019-04-07 MED ORDER — PREDNISONE 10 MG (21) PO TBPK: ORAL_TABLET | ORAL | 0 refills | Status: DC

## 2019-04-07 MED ORDER — FLUTICASONE PROPIONATE 50 MCG/ACT NA SUSP: 1.0000 | Freq: Two times a day (BID) | NASAL | 6 refills | Status: DC | PRN

## 2019-04-07 NOTE — Progress Notes (Signed)
   Virtual Visit via telephone Note  I connected with Joshua Compton. on 04/07/19 at 9:35 AM  by telephone and verified that I am speaking with the correct person using two identifiers. Joshua Compton. is currently located at home and wife is currently with her during visit. The provider, Evelina Dun, FNP is located in their office at time of visit.  I discussed the limitations, risks, security and privacy concerns of performing an evaluation and management service by telephone and the availability of in person appointments. I also discussed with the patient that there may be a patient responsible charge related to this service. The patient expressed understanding and agreed to proceed.   History and Present Illness:  Cough  This is a new problem. The current episode started in the past 7 days. The problem has been unchanged. The problem occurs every few minutes. The cough is productive of sputum and productive of purulent sputum. Associated symptoms include chills. Pertinent negatives include no ear congestion, ear pain, fever, headaches, myalgias, nasal congestion, postnasal drip, rhinorrhea, sore throat, shortness of breath or wheezing. The symptoms are aggravated by lying down. He has tried rest (tylenol) for the symptoms. The treatment provided mild relief.      Review of Systems  Constitutional: Positive for chills. Negative for fever.  HENT: Negative for ear pain, postnasal drip, rhinorrhea and sore throat.   Respiratory: Positive for cough. Negative for shortness of breath and wheezing.   Musculoskeletal: Negative for myalgias.  Neurological: Negative for headaches.     Observations/Objective: No SOB or distress noted, hoarse voice noted  Assessment and Plan: 1. Bronchitis - Take meds as prescribed - Use a cool mist humidifier  -Use saline nose sprays frequently -Force fluids -For any cough or congestion  Use plain Mucinex- regular strength or max strength is  fine -For fever or aces or pains- take tylenol or ibuprofen. -Throat lozenges if help -Call office if symptoms worsens or do not improve over the next 2-3 days - fluticasone (FLONASE) 50 MCG/ACT nasal spray; Place 1 spray into both nostrils 2 (two) times daily as needed for allergies or rhinitis.  Dispense: 16 g; Refill: 6 - predniSONE (STERAPRED UNI-PAK 21 TAB) 10 MG (21) TBPK tablet; Use as directed  Dispense: 21 tablet; Refill: 0     I discussed the assessment and treatment plan with the patient. The patient was provided an opportunity to ask questions and all were answered. The patient agreed with the plan and demonstrated an understanding of the instructions.   The patient was advised to call back or seek an in-person evaluation if the symptoms worsen or if the condition fails to improve as anticipated.  The above assessment and management plan was discussed with the patient. The patient verbalized understanding of and has agreed to the management plan. Patient is aware to call the clinic if symptoms persist or worsen. Patient is aware when to return to the clinic for a follow-up visit. Patient educated on when it is appropriate to go to the emergency department.   Time call ended:  9:44 AM  I provided 9 minutes of non-face-to-face time during this encounter.    Evelina Dun, FNP

## 2019-04-07 NOTE — Telephone Encounter (Signed)
TTC the pt and didn't get an answer, so called the pharmacy and advised per [facility] note today pt was to get Plain Mucinex (regular or max strength) for cough/congestion. She wasn't sending in rx cough medicine. Rhonda at the pharmacy states she will let the pt know since he was in the pharmacy.

## 2019-04-08 MED ORDER — PROMETHAZINE-DM 6.25-15 MG/5ML PO SYRP: 5.0000 mL | ORAL_SOLUTION | Freq: Three times a day (TID) | ORAL | 0 refills | Status: DC | PRN

## 2019-04-08 NOTE — Telephone Encounter (Signed)
Patient aware.

## 2019-04-08 NOTE — Telephone Encounter (Signed)
Cough syrup Prescription sent to pharmacy  

## 2019-04-15 ENCOUNTER — Other Ambulatory Visit: Payer: Self-pay

## 2019-04-15 ENCOUNTER — Encounter: Payer: Self-pay | Admitting: Family Medicine

## 2019-04-15 ENCOUNTER — Ambulatory Visit (INDEPENDENT_AMBULATORY_CARE_PROVIDER_SITE_OTHER): Payer: Medicare Other | Admitting: Family Medicine

## 2019-04-15 DIAGNOSIS — J40 Bronchitis, not specified as acute or chronic: Secondary | ICD-10-CM | POA: Diagnosis not present

## 2019-04-15 DIAGNOSIS — J329 Chronic sinusitis, unspecified: Secondary | ICD-10-CM | POA: Diagnosis not present

## 2019-04-15 MED ORDER — FELODIPINE ER 5 MG PO TB24: 5.0000 mg | ORAL_TABLET | Freq: Every day | ORAL | 1 refills | Status: DC

## 2019-04-15 MED ORDER — PANTOPRAZOLE SODIUM 40 MG PO TBEC: 40.0000 mg | DELAYED_RELEASE_TABLET | Freq: Every day | ORAL | 1 refills | Status: DC

## 2019-04-15 MED ORDER — LEVOFLOXACIN 500 MG PO TABS: 500.0000 mg | ORAL_TABLET | Freq: Every day | ORAL | 0 refills | Status: DC

## 2019-04-15 NOTE — Progress Notes (Signed)
No chief complaint on file.   HPI  Patient presents today for Patient presents with upper respiratory congestion. Hoarse.  There is moderate sore throat. Patient reports coughing frequently as well.  Scant sputum noted. There is no fever, chills or sweats. The patient denies being short of breath. Onset was 3-5 days ago. Gradually worsening. Tried prednisone. No better.    Due for check up, but almost out of meds for BP, heartburn - they work well, denies adverse effect.  PMH: Smoking status noted ROS: Per HPI  Objective: There were no vitals taken for this visit. Gen: NAD, alert, Very hoarse over phone Exam deferred. Pt. Harboring due to COVID 19. Phone visit performed.  Assessment and plan:  1. Sinobronchitis     Meds ordered this encounter  Medications  . levofloxacin (LEVAQUIN) 500 MG tablet    Sig: Take 1 tablet (500 mg total) by mouth daily.    Dispense:  7 tablet    Refill:  0  . felodipine (PLENDIL) 5 MG 24 hr tablet    Sig: Take 1 tablet (5 mg total) by mouth daily. For blood pressure    Dispense:  90 tablet    Refill:  1  . pantoprazole (PROTONIX) 40 MG tablet    Sig: Take 1 tablet (40 mg total) by mouth daily. For heartburn    Dispense:  90 tablet    Refill:  1    No orders of the defined types were placed in this encounter.   Follow up as needed.  [name], MD

## 2019-05-03 DIAGNOSIS — H401123 Primary open-angle glaucoma, left eye, severe stage: Secondary | ICD-10-CM | POA: Diagnosis not present

## 2019-05-03 DIAGNOSIS — H401112 Primary open-angle glaucoma, right eye, moderate stage: Secondary | ICD-10-CM | POA: Diagnosis not present

## 2019-05-30 ENCOUNTER — Other Ambulatory Visit: Payer: Self-pay | Admitting: Family Medicine

## 2019-05-30 DIAGNOSIS — J40 Bronchitis, not specified as acute or chronic: Secondary | ICD-10-CM

## 2019-05-30 MED ORDER — ENALAPRIL-HYDROCHLOROTHIAZIDE 5-12.5 MG PO TABS: 1.0000 | ORAL_TABLET | Freq: Every day | ORAL | 0 refills | Status: DC

## 2019-05-30 MED ORDER — ALBUTEROL SULFATE HFA 108 (90 BASE) MCG/ACT IN AERS: 2.0000 | INHALATION_SPRAY | Freq: Four times a day (QID) | RESPIRATORY_TRACT | 0 refills | Status: DC | PRN

## 2019-05-30 NOTE — Telephone Encounter (Signed)
Refills sent to pharmacy.  Patient aware to keep appointment

## 2019-06-01 ENCOUNTER — Ambulatory Visit (INDEPENDENT_AMBULATORY_CARE_PROVIDER_SITE_OTHER): Payer: Medicare Other | Admitting: Family Medicine

## 2019-06-01 ENCOUNTER — Encounter: Payer: Self-pay | Admitting: Family Medicine

## 2019-06-01 DIAGNOSIS — M19029 Primary osteoarthritis, unspecified elbow: Secondary | ICD-10-CM

## 2019-06-01 DIAGNOSIS — M1612 Unilateral primary osteoarthritis, left hip: Secondary | ICD-10-CM | POA: Diagnosis not present

## 2019-06-01 DIAGNOSIS — K219 Gastro-esophageal reflux disease without esophagitis: Secondary | ICD-10-CM | POA: Diagnosis not present

## 2019-06-01 DIAGNOSIS — I10 Essential (primary) hypertension: Secondary | ICD-10-CM | POA: Diagnosis not present

## 2019-06-01 DIAGNOSIS — E782 Mixed hyperlipidemia: Secondary | ICD-10-CM

## 2019-06-01 MED ORDER — FELODIPINE ER 5 MG PO TB24: 5.0000 mg | ORAL_TABLET | Freq: Every day | ORAL | 1 refills | Status: DC

## 2019-06-01 MED ORDER — PANTOPRAZOLE SODIUM 40 MG PO TBEC: 40.0000 mg | DELAYED_RELEASE_TABLET | Freq: Every day | ORAL | 1 refills | Status: DC

## 2019-06-01 MED ORDER — ENALAPRIL-HYDROCHLOROTHIAZIDE 5-12.5 MG PO TABS: 1.0000 | ORAL_TABLET | Freq: Every day | ORAL | 1 refills | Status: DC

## 2019-06-01 NOTE — Progress Notes (Signed)
Subjective:    Patient ID: Joshua Guiles., male    DOB: [birth year], 78 y.o.   MRN: [id number]   HPI: Joshua Bruso. is a 78 y.o. male presenting for  in for follow-up of elevated cholesterol. Doing well without complaints on current medication. Denies side effects of statin including myalgia and arthralgia and nausea. Currently no chest pain, shortness of breath or other cardiovascular related symptoms noted.   presents for  follow-up of hypertension. Patient has no history of headache chest pain or shortness of breath or recent cough. Patient also denies symptoms of TIA such as focal numbness or weakness. Patient denies side effects from medication. States taking it regularly.    Depression screen [facility] 2/9 12/06/2018 11/23/2018 06/01/2018 05/25/2018 11/18/2017  Decreased Interest 0 0 0 0 0  Down, Depressed, Hopeless 0 0 0 0 0  PHQ - 2 Score 0 0 0 0 0     Relevant past medical, surgical, family and social history reviewed and updated as indicated.  Interim medical history since our last visit reviewed. Allergies and medications reviewed and updated.  ROS:  Review of Systems   Social History   Tobacco Use  Smoking Status Former Smoker  . Packs/day: 0.50  . Years: 30.00  . Pack years: 15.00  . Types: Cigarettes  . Quit date: 03/28/2005  . Years since quitting: 14.1  Smokeless Tobacco Never Used       Objective:     Wt Readings from Last 3 Encounters:  12/06/18 202 lb 6 oz (91.8 kg)  11/23/18 202 lb 3.2 oz (91.7 kg)  06/01/18 202 lb (91.6 kg)     Exam deferred. Pt. Harboring due to COVID 19. Phone visit performed.   Assessment & Plan:   1. Essential hypertension   2. Mixed hyperlipidemia   3. Gastroesophageal reflux disease, esophagitis presence not specified   4. Elbow arthritis, left   5. Arthritis of left hip     Meds ordered this encounter  Medications  . Enalapril-hydroCHLOROthiazide 5-12.5 MG tablet    Sig: Take 1 tablet by mouth daily.   Dispense:  90 tablet    Refill:  1  . felodipine (PLENDIL) 5 MG 24 hr tablet    Sig: Take 1 tablet (5 mg total) by mouth daily. For blood pressure    Dispense:  90 tablet    Refill:  1  . pantoprazole (PROTONIX) 40 MG tablet    Sig: Take 1 tablet (40 mg total) by mouth daily. For heartburn    Dispense:  90 tablet    Refill:  1    Orders Placed This Encounter  Procedures  . Ambulatory referral to Orthopedics    Referral Priority:   Routine    Referral Type:   Consultation    Number of Visits Requested:   1      Diagnoses and all orders for this visit:  Essential hypertension -     Enalapril-hydroCHLOROthiazide 5-12.5 MG tablet; Take 1 tablet by mouth daily. -     felodipine (PLENDIL) 5 MG 24 hr tablet; Take 1 tablet (5 mg total) by mouth daily. For blood pressure  Mixed hyperlipidemia  Gastroesophageal reflux disease, esophagitis presence not specified -     pantoprazole (PROTONIX) 40 MG tablet; Take 1 tablet (40 mg total) by mouth daily. For heartburn  Elbow arthritis, left -     Ambulatory referral to Orthopedics  Arthritis of left hip -     Ambulatory referral to Orthopedics  Virtual Visit via telephone Note  I discussed the limitations, risks, security and privacy concerns of performing an evaluation and management service by telephone and the availability of in person appointments. The patient was identified with two identifiers. Pt.expressed understanding and agreed to proceed. Pt. Is at home. Dr. Livia Snellen is in his office.  Follow Up Instructions:   I discussed the assessment and treatment plan with the patient. The patient was provided an opportunity to ask questions and all were answered. The patient agreed with the plan and demonstrated an understanding of the instructions.   The patient was advised to call back or seek an in-person evaluation if the symptoms worsen or if the condition fails to improve as anticipated.   Total minutes including chart review  and phone contact time: 20   Follow up plan: Return in about 6 months (around 12/02/2019) for CPE, Arthritis, cholesterol.  [name], MD [place]

## 2019-06-06 ENCOUNTER — Ambulatory Visit: Payer: Medicare Other | Admitting: Family Medicine

## 2019-06-07 ENCOUNTER — Ambulatory Visit: Payer: Self-pay

## 2019-06-07 ENCOUNTER — Ambulatory Visit (INDEPENDENT_AMBULATORY_CARE_PROVIDER_SITE_OTHER): Payer: Medicare Other | Admitting: Orthopaedic Surgery

## 2019-06-07 ENCOUNTER — Encounter: Payer: Self-pay | Admitting: Orthopaedic Surgery

## 2019-06-07 VITALS — Ht 71.0 in | Wt 202.4 lb

## 2019-06-07 DIAGNOSIS — M25522 Pain in left elbow: Secondary | ICD-10-CM

## 2019-06-07 DIAGNOSIS — M1612 Unilateral primary osteoarthritis, left hip: Secondary | ICD-10-CM | POA: Diagnosis not present

## 2019-06-07 MED ORDER — CELECOXIB 200 MG PO CAPS: 200.0000 mg | ORAL_CAPSULE | Freq: Two times a day (BID) | ORAL | 3 refills | Status: DC

## 2019-06-07 NOTE — Progress Notes (Signed)
Office Visit Note   Patient: Joshua Compton.           Date of Birth: 05/22/1941           MRN: [id number] Visit Date: 06/07/2019              Requested by: [name], MD [place],  [place] [id number] PCP: [name], MD   Assessment & Plan: Visit Diagnoses:  1. Primary osteoarthritis of left hip   2. Pain in left elbow     Plan: Impression is left hip advanced degenerative joint disease and mild left elbow osteoarthritis.  Fortunately his quality of life is still well-preserved despite the advanced hip arthrosis.  He is not really interested in cortisone injections at this time.  He will continue to take Advil but would like to try Celebrex.  He knows that he has option of getting a hip injection in the future if his pain worsens.  Questions encouraged and answered today.  We will see him back as needed.  Follow-Up Instructions: Return if symptoms worsen or fail to improve.   Orders:  Orders Placed This Encounter  Procedures  . XR Elbow 2 Views Left  . XR HIP UNILAT W OR W/O PELVIS 2-3 VIEWS LEFT   Meds ordered this encounter  Medications  . celecoxib (CELEBREX) 200 MG capsule    Sig: Take 1 capsule (200 mg total) by mouth 2 (two) times daily.    Dispense:  30 capsule    Refill:  3      Procedures: No procedures performed   Clinical Data: No additional findings.   Subjective: Chief Complaint  Patient presents with  . Left Elbow - Pain  . Left Hip - Pain    Mr. Joshua Compton is a very active 78 year old gentleman comes in for evaluation of left hip and left elbow pain.  In regards to the left hip he states that this is been bothering him for several years but the pain is relatively mild and he has still able to be very active.  Occasionally will make him limp and he will get some sharp pain when he is walking but Advil does relieve the pain.  He denies any back pain or radicular symptoms.  In regards to the left elbow he denies any injuries he  states that he feels stiff when he flexes and extends the elbow.  He denies any point tenderness.  The pain and discomfort is deep-seated within the elbow.  Denies any numbness and tingling.   Review of Systems  Constitutional: Negative.   All other systems reviewed and are negative.    Objective: Vital Signs: Ht 5\' 11"  (1.803 m)   Wt 202 lb 6.1 oz (91.8 kg)   BMI 28.23 kg/m   Physical Exam Vitals signs and nursing note reviewed.  Constitutional:      Appearance: He is well-developed.  HENT:     Head: Normocephalic and atraumatic.  Eyes:     Pupils: Pupils are equal, round, and reactive to light.  Neck:     Musculoskeletal: Neck supple.  Pulmonary:     Effort: Pulmonary effort is normal.  Abdominal:     Palpations: Abdomen is soft.  Musculoskeletal: Normal range of motion.  Skin:    General: Skin is warm.  Neurological:     Mental Status: He is alert and oriented to person, place, and time.  Psychiatric:        Behavior: Behavior normal.  Thought Content: Thought content normal.        Judgment: Judgment normal.     Ortho Exam Left hip exam shows positive FADIR and positive Stinchfield.  Trochanteric bursa is nontender.  He is able to ambulate with a very mild limp. Left elbow exam shows full range of motion with some discomfort with flexion extension.  No point tenderness.  Collaterals are stable. Specialty Comments:  No specialty comments available.  Imaging: Xr Elbow 2 Views Left  Result Date: 06/07/2019 Mild osteoarthritis  Xr Hip Unilat W Or W/o Pelvis 2-3 Views Left  Result Date: 06/07/2019 Advanced bone-on-bone joint space narrowing of the left hip joint.     PMFS History: Patient Active Problem List   Diagnosis Date Noted  . Thyromegaly 05/19/2017  . Neoplasm of scalp 12/08/2013  . Rhinitis 12/08/2013  . Cough 12/08/2013  . Hyperlipidemia   . Cancer ([place])   . Arthritis   . Severe stage glaucoma 03/26/2012  . Nuclear sclerosis  03/26/2012  . Primary open angle glaucoma 10/27/2011  . Essential hypertension 02/04/2011  . Degenerative disc disease 02/04/2011  . Gastrointestinal bleed 02/04/2011  . Anemia 02/04/2011  . Prostate cancer ([place]) 02/04/2011   Past Medical History:  Diagnosis Date  . Arthritis   . Cancer [facility])    prostate ca   seed implants  . Glaucoma   . Hyperlipidemia   . Hypertension    dr Jacelyn Grip     [place] Niece   fm    Family History  Problem Relation Age of Onset  . Cancer Mother        breast  . Heart disease Father   . Cancer Brother   . Cancer Brother     Past Surgical History:  Procedure Laterality Date  . BACK SURGERY     x2  . HERNIA REPAIR  11-06-2009   left inguinal repair   . NECK SURGERY    . PROSTATE SURGERY     prsatated cancer  . ROTATOR CUFF REPAIR Left jan 2016  . TRANSURETHRAL RESECTION OF PROSTATE     Social History   Occupational History  . Occupation: Artist: retired  Tobacco Use  . Smoking status: Former Smoker    Packs/day: 0.50    Years: 30.00    Pack years: 15.00    Types: Cigarettes    Quit date: 03/28/2005    Years since quitting: 14.2  . Smokeless tobacco: Never Used  Substance and Sexual Activity  . Alcohol use: No    Alcohol/week: 0.0 standard drinks  . Drug use: No  . Sexual activity: Yes

## 2019-08-12 ENCOUNTER — Ambulatory Visit (INDEPENDENT_AMBULATORY_CARE_PROVIDER_SITE_OTHER): Payer: Medicare Other

## 2019-08-12 ENCOUNTER — Other Ambulatory Visit: Payer: Self-pay

## 2019-08-12 DIAGNOSIS — Z23 Encounter for immunization: Secondary | ICD-10-CM

## 2019-11-01 LAB — HM DIABETES EYE EXAM

## 2019-11-28 ENCOUNTER — Other Ambulatory Visit: Payer: Self-pay | Admitting: Family Medicine

## 2019-12-15 ENCOUNTER — Ambulatory Visit: Payer: Self-pay

## 2019-12-16 ENCOUNTER — Other Ambulatory Visit: Payer: Self-pay

## 2019-12-16 ENCOUNTER — Encounter: Payer: Self-pay | Admitting: Family Medicine

## 2019-12-16 ENCOUNTER — Ambulatory Visit (INDEPENDENT_AMBULATORY_CARE_PROVIDER_SITE_OTHER): Payer: Medicare Other | Admitting: Family Medicine

## 2019-12-16 VITALS — BP 136/74 | HR 68 | Temp 97.8°F | Ht 71.0 in | Wt 205.0 lb

## 2019-12-16 DIAGNOSIS — I739 Peripheral vascular disease, unspecified: Secondary | ICD-10-CM | POA: Diagnosis not present

## 2019-12-16 DIAGNOSIS — G4769 Other sleep related movement disorders: Secondary | ICD-10-CM | POA: Diagnosis not present

## 2019-12-16 MED ORDER — ROPINIROLE HCL 1 MG PO TABS: 1.0000 mg | ORAL_TABLET | Freq: Every day | ORAL | 5 refills | Status: DC

## 2019-12-16 NOTE — Progress Notes (Signed)
Subjective:  Patient ID: Joshua Compton., male    DOB: 03-14-1941  Age: 79 y.o. MRN: [id number]  CC: Cold Feet (Bilateral)   HPI Joshua Compton. presents for increasing coldness in his toes.  They feel stiff at times.  Cramps going up the legs particularly at night.  The calves will wake him and he will have to walk it off.  This is been increasing for several months.  He denies any swelling or lesion.  He says he put a heating pad at the bottom of his bed last night and that helped some but was insufficient.  He does have ramping through the day but he does not related back to ambulation.  He is a bit vague at this point  Depression screen [facility] 2/9 12/16/2019 12/06/2018 11/23/2018  Decreased Interest 0 0 0  Down, Depressed, Hopeless 0 0 0  PHQ - 2 Score 0 0 0    History Joshua Compton has a past medical history of Arthritis, Cancer ([place]), Glaucoma, Hyperlipidemia, and Hypertension.   He has a past surgical history that includes Back surgery; Neck surgery; Transurethral resection of prostate; Prostate surgery; Hernia repair (11-06-2009); and Rotator cuff repair (Left, jan 2016).   His family history includes Cancer in his brother, brother, and mother; Heart disease in his father.He reports that he quit smoking about 14 years ago. His smoking use included cigarettes. He has a 15.00 pack-year smoking history. He has never used smokeless tobacco. He reports that he does not drink alcohol or use drugs.    ROS Review of Systems  Constitutional: Negative for fever.  Respiratory: Negative for shortness of breath.   Cardiovascular: Negative for chest pain.  Musculoskeletal: Negative for arthralgias.  Skin: Negative for rash.    Objective:  BP 136/74   Pulse 68   Temp 97.8 F (36.6 C) (Temporal)   Ht 5\' 11"  (1.803 m)   Wt 205 lb (93 kg)   BMI 28.59 kg/m   BP Readings from Last 3 Encounters:  12/16/19 136/74  01/31/19 (!) 161/80  12/06/18 136/60    Wt Readings from Last 3  Encounters:  12/16/19 205 lb (93 kg)  06/07/19 202 lb 6.1 oz (91.8 kg)  12/06/18 202 lb 6 oz (91.8 kg)     Physical Exam Vitals reviewed.  Constitutional:      Appearance: He is well-developed.  HENT:     Head: Normocephalic and atraumatic.     Right Ear: External ear normal.     Left Ear: External ear normal.     Mouth/Throat:     Pharynx: No oropharyngeal exudate or posterior oropharyngeal erythema.  Eyes:     Pupils: Pupils are equal, round, and reactive to light.  Cardiovascular:     Rate and Rhythm: Normal rate and regular rhythm.     Heart sounds: No murmur.  Pulmonary:     Effort: No respiratory distress.     Breath sounds: Normal breath sounds.  Musculoskeletal:        General: No swelling, tenderness, deformity or signs of injury. Normal range of motion.     Cervical back: Normal range of motion and neck supple.  Neurological:     Mental Status: He is alert and oriented to person, place, and time.       Assessment & Plan:   [name] was seen today for cold feet.  Diagnoses and all orders for this visit:  Intermittent claudication ([place]) -     VAS [place] ABI WITH/WO TBI;  Future  Nocturnal myoclonus -     rOPINIRole (REQUIP) 1 MG tablet; Take 1 tablet (1 mg total) by mouth at bedtime. For leg cramps   Although this sounds more like myoclonus is an element of cold feet which may be coming from circular.  Therefore with the cramping in the vague history I think it best to evaluate him for claudication see if that is helpful as well.    I am having Joshua Compton. start on rOPINIRole. I am also having him maintain his dorzolamide-timolol, fluticasone, Enalapril-hydroCHLOROthiazide, felodipine, pantoprazole, celecoxib, and rosuvastatin.  Allergies as of 12/16/2019      Reactions   Pravastatin Sodium    Muscle aches      Medication List       Accurate as of December 16, 2019  7:03 PM. If you have any questions, ask your nurse or doctor.          celecoxib 200 MG capsule Commonly known as: CELEBREX Take 1 capsule (200 mg total) by mouth 2 (two) times daily.   dorzolamide-timolol 22.3-6.8 MG/ML ophthalmic solution Commonly known as: COSOPT   Enalapril-hydroCHLOROthiazide 5-12.5 MG tablet Take 1 tablet by mouth daily.   felodipine 5 MG 24 hr tablet Commonly known as: PLENDIL Take 1 tablet (5 mg total) by mouth daily. For blood pressure   fluticasone 50 MCG/ACT nasal spray Commonly known as: FLONASE Place 1 spray into both nostrils 2 (two) times daily as needed for allergies or rhinitis.   pantoprazole 40 MG tablet Commonly known as: PROTONIX Take 1 tablet (40 mg total) by mouth daily. For heartburn   rOPINIRole 1 MG tablet Commonly known as: REQUIP Take 1 tablet (1 mg total) by mouth at bedtime. For leg cramps Started by: [name], MD   rosuvastatin 5 MG tablet Commonly known as: CRESTOR Take 1 tablet (5 mg total) by mouth daily. (Needs to be seen before next refill)        Follow-up: Return in about 1 month (around 01/15/2020).  [name], M.D.

## 2019-12-22 ENCOUNTER — Ambulatory Visit: Payer: Medicare Other | Attending: Internal Medicine

## 2019-12-22 DIAGNOSIS — Z23 Encounter for immunization: Secondary | ICD-10-CM

## 2019-12-22 NOTE — Progress Notes (Signed)
   [id number]  Name:  Cade Swayzer.    MRN: [id number] DOB: 16-Aug-1941  12/22/2019  Mr. Metzker was observed post Covid-19 immunization for 15 minutes without incidence. He was provided with Vaccine Information Sheet and instruction to access the V-Safe system.   Mr. Pitoniak was instructed to call 911 with any severe reactions post vaccine: Marland Kitchen Difficulty breathing  . Swelling of your face and throat  . A fast heartbeat  . A bad rash all over your body  . Dizziness and weakness    Immunizations Administered    Name Date Dose VIS Date Route   Moderna COVID-19 Vaccine 12/22/2019 10:30 AM 0.5 mL 10/18/2019 Intramuscular   Manufacturer: Moderna   Lot: [id number]   [id number]

## 2019-12-28 ENCOUNTER — Telehealth: Payer: Self-pay | Admitting: Family Medicine

## 2019-12-28 NOTE — Telephone Encounter (Signed)
REFERRAL REQUEST Telephone Note  What type of referral do you need? Vein Specialist  Have you been seen at our office for this problem? Yes, last week (Advise that they will likely need an appointment with their PCP before a referral can be done)  Is there a particular doctor or location that you prefer? [place]  Patient notified that referrals can take up to a week or longer to process. If they haven't heard anything within a week they should call back and speak with the referral department.   Flow of feet to legs.  Stacks' pt.  Please call him tomorrow.

## 2019-12-29 ENCOUNTER — Other Ambulatory Visit: Payer: Self-pay | Admitting: Family Medicine

## 2019-12-29 DIAGNOSIS — I739 Peripheral vascular disease, unspecified: Secondary | ICD-10-CM

## 2020-01-13 ENCOUNTER — Other Ambulatory Visit: Payer: Self-pay

## 2020-01-16 ENCOUNTER — Encounter: Payer: Self-pay | Admitting: Family Medicine

## 2020-01-16 ENCOUNTER — Other Ambulatory Visit: Payer: Self-pay

## 2020-01-16 ENCOUNTER — Ambulatory Visit (INDEPENDENT_AMBULATORY_CARE_PROVIDER_SITE_OTHER): Payer: Medicare Other | Admitting: Family Medicine

## 2020-01-16 DIAGNOSIS — G4769 Other sleep related movement disorders: Secondary | ICD-10-CM | POA: Diagnosis not present

## 2020-01-16 MED ORDER — ROPINIROLE HCL 1 MG PO TABS: 1.0000 mg | ORAL_TABLET | Freq: Two times a day (BID) | ORAL | 5 refills | Status: DC

## 2020-01-16 NOTE — Progress Notes (Signed)
Subjective:  Patient ID: Joshua Compton., male    DOB: 09-02-1941  Age: 79 y.o. MRN: [id number]  CC: Follow-up (1 month)   HPI Joshua Compton. presents for Patient is waking up in the mornings and getting up and for the first half dozen steps his legs will be sore.  He notes that when he moves around in bed he will have cramps in his legs as well.  Mostly through the day if he sits still for a while he will get sore when he gets up to move around.  No overt cramps during the day at this point.  He is not highly active though.  He does get more soreness later in the day as well.  No numbness or weakness noted.  He has an appointment with vascular surgery, Dr. Sherren Mocha early, for about 1 month from now.   Depression screen [facility] 2/9 01/16/2020 12/16/2019 12/06/2018  Decreased Interest 0 0 0  Down, Depressed, Hopeless 0 0 0  PHQ - 2 Score 0 0 0    History Khaaliq has a past medical history of Arthritis, Cancer ([place]), Glaucoma, Hyperlipidemia, and Hypertension.   He has a past surgical history that includes Back surgery; Neck surgery; Transurethral resection of prostate; Prostate surgery; Hernia repair (11-06-2009); and Rotator cuff repair (Left, jan 2016).   His family history includes Cancer in his brother, brother, and mother; Heart disease in his father.He reports that he quit smoking about 14 years ago. His smoking use included cigarettes. He has a 15.00 pack-year smoking history. He has never used smokeless tobacco. He reports that he does not drink alcohol or use drugs.    ROS Review of Systems  Constitutional: Negative for fever.  Respiratory: Negative for shortness of breath.   Cardiovascular: Negative for chest pain.  Musculoskeletal: Negative for arthralgias.  Skin: Negative for rash.    Objective:  BP 130/73   Pulse 66   Temp 98.2 F (36.8 C) (Temporal)   Ht 5\' 11"  (1.803 m)   Wt 205 lb 9.6 oz (93.3 kg)   BMI 28.68 kg/m   BP Readings from Last 3 Encounters:    01/16/20 130/73  12/16/19 136/74  01/31/19 (!) 161/80    Wt Readings from Last 3 Encounters:  01/16/20 205 lb 9.6 oz (93.3 kg)  12/16/19 205 lb (93 kg)  06/07/19 202 lb 6.1 oz (91.8 kg)     Physical Exam Vitals reviewed.  Constitutional:      Appearance: He is well-developed.  HENT:     Head: Normocephalic and atraumatic.     Right Ear: External ear normal.     Left Ear: External ear normal.     Mouth/Throat:     Pharynx: No oropharyngeal exudate or posterior oropharyngeal erythema.  Eyes:     Pupils: Pupils are equal, round, and reactive to light.  Cardiovascular:     Rate and Rhythm: Normal rate and regular rhythm.     Heart sounds: No murmur.  Pulmonary:     Effort: No respiratory distress.     Breath sounds: Normal breath sounds.  Musculoskeletal:     Cervical back: Normal range of motion and neck supple.  Neurological:     Mental Status: He is alert and oriented to person, place, and time.       Assessment & Plan:   [name] was seen today for follow-up.  Diagnoses and all orders for this visit:  Nocturnal myoclonus -     rOPINIRole (REQUIP) 1 MG  tablet; Take 1 tablet (1 mg total) by mouth in the morning and at bedtime. For leg cramps       I have changed [name]'s rOPINIRole. I am also having him maintain his dorzolamide-timolol, fluticasone, Enalapril-hydroCHLOROthiazide, felodipine, pantoprazole, celecoxib, and rosuvastatin.  Allergies as of 01/16/2020      Reactions   Pravastatin Sodium    Muscle aches      Medication List       Accurate as of January 16, 2020  4:25 PM. If you have any questions, ask your nurse or doctor.        celecoxib 200 MG capsule Commonly known as: CELEBREX Take 1 capsule (200 mg total) by mouth 2 (two) times daily.   dorzolamide-timolol 22.3-6.8 MG/ML ophthalmic solution Commonly known as: COSOPT   Enalapril-hydroCHLOROthiazide 5-12.5 MG tablet Take 1 tablet by mouth daily.   felodipine 5 MG 24  hr tablet Commonly known as: PLENDIL Take 1 tablet (5 mg total) by mouth daily. For blood pressure   fluticasone 50 MCG/ACT nasal spray Commonly known as: FLONASE Place 1 spray into both nostrils 2 (two) times daily as needed for allergies or rhinitis.   pantoprazole 40 MG tablet Commonly known as: PROTONIX Take 1 tablet (40 mg total) by mouth daily. For heartburn   rOPINIRole 1 MG tablet Commonly known as: REQUIP Take 1 tablet (1 mg total) by mouth in the morning and at bedtime. For leg cramps What changed: when to take this Changed by: [name], MD   rosuvastatin 5 MG tablet Commonly known as: CRESTOR Take 1 tablet (5 mg total) by mouth daily. (Needs to be seen before next refill)      Patient seems to have improved significantly this month based on decreased cramping and the soreness seems to have decreased as well.  There is still significant cramping during the night though.  As result, I advised him to increase the ropinirole to twice daily.  That should help with both soreness and the cramping.  In the meantime he has an appoint with Dr. Sherren Mocha early for evaluation from the vascular standpoint to make sure this pain is not from claudication of the arteries.  That appointment is set for April 6.  Follow-up: Return in about 3 months (around 04/17/2020).  [name], M.D.

## 2020-01-20 ENCOUNTER — Telehealth: Payer: Self-pay | Admitting: Family Medicine

## 2020-01-20 NOTE — Telephone Encounter (Signed)
Pt aware you are out of office today and that it would be Monday before you see this and he is ok with that.

## 2020-01-23 ENCOUNTER — Ambulatory Visit: Payer: Medicare Other | Attending: Internal Medicine

## 2020-01-23 DIAGNOSIS — Z23 Encounter for immunization: Secondary | ICD-10-CM

## 2020-01-23 NOTE — Progress Notes (Signed)
   [id number]  Name:  Obinna Francke.    MRN: [id number] DOB: May 20, 1941  01/23/2020  Mr. Norcia was observed post Covid-19 immunization for 15 minutes without incident. He was provided with Vaccine Information Sheet and instruction to access the V-Safe system.   Mr. Boeh was instructed to call 911 with any severe reactions post vaccine: Marland Kitchen Difficulty breathing  . Swelling of face and throat  . A fast heartbeat  . A bad rash all over body  . Dizziness and weakness   Immunizations Administered    Name Date Dose VIS Date Route   Moderna COVID-19 Vaccine 01/23/2020 10:49 AM 0.5 mL 10/18/2019 Intramuscular   Manufacturer: Moderna   Lot: [id number]   [place]:[id number]

## 2020-01-23 NOTE — Telephone Encounter (Signed)
Patient states he has been taking it BID and it is not helping.

## 2020-01-23 NOTE — Telephone Encounter (Signed)
Please contact the patient He should increase the ropinirole to img BID (From q day) Thanks, WS

## 2020-01-29 ENCOUNTER — Other Ambulatory Visit: Payer: Self-pay

## 2020-01-29 ENCOUNTER — Emergency Department (HOSPITAL_COMMUNITY)
Admission: EM | Admit: 2020-01-29 | Discharge: 2020-01-29 | Disposition: A | Discharge status: Home / Self Care | Payer: Medicare Other | Attending: Emergency Medicine | Admitting: Emergency Medicine

## 2020-01-29 ENCOUNTER — Emergency Department (HOSPITAL_COMMUNITY): Payer: Medicare Other

## 2020-01-29 DIAGNOSIS — Z79899 Other long term (current) drug therapy: Secondary | ICD-10-CM | POA: Diagnosis not present

## 2020-01-29 DIAGNOSIS — M25552 Pain in left hip: Secondary | ICD-10-CM | POA: Diagnosis not present

## 2020-01-29 DIAGNOSIS — Z87891 Personal history of nicotine dependence: Secondary | ICD-10-CM | POA: Insufficient documentation

## 2020-01-29 DIAGNOSIS — I10 Essential (primary) hypertension: Secondary | ICD-10-CM | POA: Diagnosis not present

## 2020-01-29 DIAGNOSIS — S79912A Unspecified injury of left hip, initial encounter: Secondary | ICD-10-CM | POA: Diagnosis not present

## 2020-01-29 MED ORDER — HYDROMORPHONE HCL 1 MG/ML IJ SOLN
0.5000 mg | Freq: Once | INTRAMUSCULAR | Status: AC
  Administered 2020-01-29: 0.5 mg via INTRAMUSCULAR
  Filled 2020-01-29: qty 1

## 2020-01-29 MED ORDER — PREDNISONE 10 MG PO TABS: 10.0000 mg | ORAL_TABLET | Freq: Every day | ORAL | 0 refills | Status: DC

## 2020-01-29 MED ORDER — NAPROXEN 375 MG PO TABS: 375.0000 mg | ORAL_TABLET | Freq: Two times a day (BID) | ORAL | 0 refills | Status: DC

## 2020-01-29 NOTE — ED Notes (Signed)
Patient transported to X-ray 

## 2020-01-29 NOTE — ED Provider Notes (Addendum)
[facility]Y [facility]MENT Provider Note   CSN: [id number] Arrival date & time: 01/29/20  1120     History Chief Complaint  Patient presents with  . Hip Pain    Joshua Compton. is a 79 y.o. male.  Persistent left hip pain for several months.  No trauma or injury.  He was seen by Dr. Erlinda Hong on 06/07/2019 with a diagnosis of osteoarthritis of the left hip.  He was offered a steroid injection at that time, but did not take it.  Patient is able to ambulate but with pain.  No fever or chills.        Past Medical History:  Diagnosis Date  . Arthritis   . Cancer [facility])    prostate ca   seed implants  . Glaucoma   . Hyperlipidemia   . Hypertension    dr Mariane Duval   fm    Patient Active Problem List   Diagnosis Date Noted  . Thyromegaly 05/19/2017  . Neoplasm of scalp 12/08/2013  . Rhinitis 12/08/2013  . Cough 12/08/2013  . Hyperlipidemia   . Cancer ([place])   . Arthritis   . Severe stage glaucoma 03/26/2012  . Nuclear sclerosis 03/26/2012  . Primary open angle glaucoma 10/27/2011  . Essential hypertension 02/04/2011  . Degenerative disc disease 02/04/2011  . Gastrointestinal bleed 02/04/2011  . Anemia 02/04/2011  . Prostate cancer ([place]) 02/04/2011    Past Surgical History:  Procedure Laterality Date  . BACK SURGERY     x2  . HERNIA REPAIR  11-06-2009   left inguinal repair   . NECK SURGERY    . PROSTATE SURGERY     prsatated cancer  . ROTATOR CUFF REPAIR Left jan 2016  . TRANSURETHRAL RESECTION OF PROSTATE         Family History  Problem Relation Age of Onset  . Cancer Mother        breast  . Heart disease Father   . Cancer Brother   . Cancer Brother     Social History   Tobacco Use  . Smoking status: Former Smoker    Packs/day: 0.50    Years: 30.00    Pack years: 15.00    Types: Cigarettes    Quit date: 03/28/2005    Years since quitting: 14.8  . Smokeless tobacco: Never Used  Substance Use Topics  .  Alcohol use: No    Alcohol/week: 0.0 standard drinks  . Drug use: No    Home Medications Prior to Admission medications   Medication Sig Start Date End Date Taking? Authorizing Provider  celecoxib (CELEBREX) 200 MG capsule Take 1 capsule (200 mg total) by mouth 2 (two) times daily. 06/07/19   Leandrew Koyanagi, MD  dorzolamide-timolol (COSOPT) 22.3-6.8 MG/ML ophthalmic solution  08/04/16   [provider]  Enalapril-hydroCHLOROthiazide 5-12.5 MG tablet Take 1 tablet by mouth daily. 06/01/19   [name], MD  felodipine (PLENDIL) 5 MG 24 hr tablet Take 1 tablet (5 mg total) by mouth daily. For blood pressure 06/01/19   [name], MD  fluticasone [facility]) 50 MCG/ACT nasal spray Place 1 spray into both nostrils 2 (two) times daily as needed for allergies or rhinitis. 04/07/19   Evelina Dun A, FNP  naproxen (NAPROSYN) 375 MG tablet Take 1 tablet (375 mg total) by mouth 2 (two) times daily. 01/29/20   Nat Christen, MD  pantoprazole (PROTONIX) 40 MG tablet Take 1 tablet (40 mg total) by mouth daily. For  heartburn 06/01/19   [name], MD  predniSONE (DELTASONE) 10 MG tablet Take 1 tablet (10 mg total) by mouth daily with breakfast. 2 tablets for 4 days, 1 tablet for 4 days 01/29/20   Nat Christen, MD  rOPINIRole (REQUIP) 1 MG tablet Take 1 tablet (1 mg total) by mouth in the morning and at bedtime. For leg cramps 01/16/20   [name], MD  rosuvastatin (CRESTOR) 5 MG tablet Take 1 tablet (5 mg total) by mouth daily. (Needs to be seen before next refill) 11/29/19   [name], MD    Allergies    Pravastatin sodium  Review of Systems   Review of Systems  All other systems reviewed and are negative.   Physical Exam Updated Vital Signs BP (!) 159/74 (BP Location: Right Arm)   Pulse 71   Temp 98.3 F (36.8 C) (Oral)   Resp 16   Ht 6' (1.829 m)   Wt 93 kg   SpO2 93%   BMI 27.80 kg/m   Physical Exam Vitals and nursing note reviewed.  Constitutional:      Appearance:  He is well-developed.  HENT:     Head: Normocephalic and atraumatic.  Eyes:     Conjunctiva/sclera: Conjunctivae normal.  Cardiovascular:     Rate and Rhythm: Normal rate.  Pulmonary:     Effort: Pulmonary effort is normal.  Musculoskeletal:     Cervical back: Neck supple.     Comments: Left hip: Tender in the posterior lateral area of the hip.  Minimal pain with range of motion.  Skin:    General: Skin is warm and dry.  Neurological:     General: No focal deficit present.     Mental Status: He is alert and oriented to person, place, and time.  Psychiatric:        Behavior: Behavior normal.     ED Results / Procedures / Treatments   Labs (all labs ordered are listed, but only abnormal results are displayed) Labs Reviewed - No data to display  EKG None  Radiology DG Hip Unilat W or Wo Pelvis 2-3 Views Left  Result Date: 01/29/2020 CLINICAL DATA:  Fall with left hip and leg pain EXAM: DG HIP (WITH OR WITHOUT PELVIS) 2-3V LEFT COMPARISON:  05/25/2018 FINDINGS: Advanced left hip osteoarthritis with superolateral joint narrowing and subchondral cysts that have developed since prior. No evidence of fracture or bone lesion. Prostate brachytherapy seeds. Lower lumbar fusion IMPRESSION: 1. No acute finding. 2. Advanced left hip osteoarthritis that has progressed from 2019. Electronically Signed   By: Monte Fantasia M.D.   On: 01/29/2020 12:58    Procedures Procedures (including critical care time)  Medications Ordered in ED Medications  HYDROmorphone (DILAUDID) injection 0.5 mg (0.5 mg Intramuscular Given 01/29/20 1418)    ED Course  I have reviewed the triage vital signs and the nursing notes.  Pertinent labs & imaging results that were available during my care of the patient were reviewed by me and considered in my medical decision making (see chart for details).    MDM Rules/Calculators/A&P                      Suspect persistent arthritis in the left hip joint.  Will  obtain plain films.  Referral to orthopedics.  1415: X-rays reviewed.  Suspect worsening osteoarthritis of left hip.  Will recommend a steroid injection via his orthopedic surgeon.  Discharge medication oral prednisone and Naprosyn 375 mg Final Clinical Impression(s) /  ED Diagnoses Final diagnoses:  Left hip pain    Rx / DC Orders ED Discharge Orders         Ordered    predniSONE (DELTASONE) 10 MG tablet  Daily with breakfast     01/29/20 1419    naproxen (NAPROSYN) 375 MG tablet  2 times daily     01/29/20 1419           Nat Christen, MD 01/29/20 1223    Nat Christen, MD 01/29/20 1424

## 2020-01-29 NOTE — ED Triage Notes (Signed)
From home, left hip pain x2 months. Denies recent trauma. Has been seen by PCP for same, pt states no fracture hx. Pt ambulatory. No obvious deformity noted. Has ortho appt in April.

## 2020-01-29 NOTE — Discharge Instructions (Addendum)
You have persistent arthritis in your left hip.  Will need an injection in the hip joint.  This can be coordinated by your orthopedic doctor (Dr. Erlinda Hong).  Call for an appointment.  Prescription for pain medicine and anti-inflammatory medicine called to your pharmacy

## 2020-02-15 ENCOUNTER — Encounter: Payer: Self-pay | Admitting: *Deleted

## 2020-02-16 ENCOUNTER — Other Ambulatory Visit: Payer: Self-pay | Admitting: *Deleted

## 2020-02-16 DIAGNOSIS — M25569 Pain in unspecified knee: Secondary | ICD-10-CM

## 2020-02-20 ENCOUNTER — Telehealth (HOSPITAL_COMMUNITY): Payer: Self-pay

## 2020-02-20 NOTE — Telephone Encounter (Signed)

## 2020-02-21 ENCOUNTER — Ambulatory Visit (INDEPENDENT_AMBULATORY_CARE_PROVIDER_SITE_OTHER): Payer: Medicare Other | Admitting: Vascular Surgery

## 2020-02-21 ENCOUNTER — Other Ambulatory Visit: Payer: Self-pay

## 2020-02-21 ENCOUNTER — Encounter: Payer: Self-pay | Admitting: Vascular Surgery

## 2020-02-21 ENCOUNTER — Ambulatory Visit (HOSPITAL_COMMUNITY)
Admission: RE | Admit: 2020-02-21 | Discharge: 2020-02-21 | Disposition: A | Discharge status: Home / Self Care | Payer: Medicare Other | Source: Ambulatory Visit | Attending: Vascular Surgery | Admitting: Vascular Surgery

## 2020-02-21 VITALS — BP 138/73 | HR 67 | Temp 98.0°F | Resp 20 | Ht 72.0 in | Wt 203.0 lb

## 2020-02-21 DIAGNOSIS — M25569 Pain in unspecified knee: Secondary | ICD-10-CM

## 2020-02-21 NOTE — Progress Notes (Signed)
[facility]ist of [place]  Patient name: Joshua [facility]. MRN: [id number] DOB: [birth year] Sex: male  REASON FOR CONSULT: Evaluate lower extremity discomfort  HPI: [name]. is a 79 y.o. male, who is here today for evaluation of lower extremity discomfort.  He reports having left hip discomfort in August 2020 and was felt to have a primary arthritic discomfort.  This has resolved with ibuprofen.  He reports that over the past several weeks he is having discomfort in both lower extremities.  He reprise this as muscle soreness bilaterally in his thighs extending into his calf.  He does not feel that this is related to activity.  He has no history of lower extremity tissue loss  Past Medical History:  Diagnosis Date  . Arthritis   . Cancer [facility])    prostate ca   seed implants  . Glaucoma   . Hyperlipidemia   . Hypertension    dr Jacelyn Grip     Willeen Niece   fm    Family History  Problem Relation Age of Onset  . Cancer Mother        breast  . Heart disease Father   . Cancer Brother   . Cancer Brother     SOCIAL HISTORY: Social History   Socioeconomic History  . Marital status: Married    Spouse name: Not on file  . Number of children: 5  . Years of education: 34  . Highest education level: 11th grade  Occupational History  . Occupation: Artist: retired  Tobacco Use  . Smoking status: Former Smoker    Packs/day: 0.50    Years: 30.00    Pack years: 15.00    Types: Cigarettes    Quit date: 03/28/2005    Years since quitting: 14.9  . Smokeless tobacco: Never Used  Substance and Sexual Activity  . Alcohol use: No    Alcohol/week: 0.0 standard drinks  . Drug use: No  . Sexual activity: Yes  Other Topics Concern  . Not on file  Social History Narrative   ** Merged History Encounter **       Social Determinants of Health   Financial Resource Strain:   . Difficulty of Paying Living  Expenses:   Food Insecurity:   . Worried About Charity fundraiser in the Last Year:   . Arboriculturist in the Last Year:   Transportation Needs:   . Film/video editor (Medical):   Marland Kitchen Lack of Transportation (Non-Medical):   Physical Activity:   . Days of Exercise per Week:   . Minutes of Exercise per Session:   Stress:   . Feeling of Stress :   Social Connections:   . Frequency of Communication with Friends and Family:   . Frequency of Social Gatherings with Friends and Family:   . Attends Religious Services:   . Active Member of Clubs or Organizations:   . Attends Archivist Meetings:   Marland Kitchen Marital Status:   Intimate Partner Violence:   . Fear of Current or Ex-Partner:   . Emotionally Abused:   Marland Kitchen Physically Abused:   . Sexually Abused:     Allergies  Allergen Reactions  . Pravastatin Sodium     Muscle aches    Current Outpatient Medications  Medication Sig Dispense Refill  . celecoxib (CELEBREX) 200 MG capsule Take 1 capsule (200 mg total) by mouth 2 (two) times daily. 30 capsule 3  . dorzolamide-timolol (  COSOPT) 22.3-6.8 MG/ML ophthalmic solution     . Enalapril-hydroCHLOROthiazide 5-12.5 MG tablet Take 1 tablet by mouth daily. 90 tablet 1  . felodipine (PLENDIL) 5 MG 24 hr tablet Take 1 tablet (5 mg total) by mouth daily. For blood pressure 90 tablet 1  . fluticasone (FLONASE) 50 MCG/ACT nasal spray Place 1 spray into both nostrils 2 (two) times daily as needed for allergies or rhinitis. 16 g 6  . naproxen (NAPROSYN) 375 MG tablet Take 1 tablet (375 mg total) by mouth 2 (two) times daily. 20 tablet 0  . pantoprazole (PROTONIX) 40 MG tablet Take 1 tablet (40 mg total) by mouth daily. For heartburn 90 tablet 1  . predniSONE (DELTASONE) 10 MG tablet Take 1 tablet (10 mg total) by mouth daily with breakfast. 2 tablets for 4 days, 1 tablet for 4 days 12 tablet 0  . rOPINIRole (REQUIP) 1 MG tablet Take 1 tablet (1 mg total) by mouth in the morning and at bedtime.  For leg cramps 60 tablet 5  . rosuvastatin (CRESTOR) 5 MG tablet Take 1 tablet (5 mg total) by mouth daily. (Needs to be seen before next refill) 30 tablet 0   No current facility-administered medications for this visit.    REVIEW OF SYSTEMS:  [X]  denotes positive finding, [ ]  denotes negative finding Cardiac  Comments:  Chest pain or chest pressure:    Shortness of breath upon exertion:    Short of breath when lying flat:    Irregular heart rhythm:        Vascular    Pain in calf, thigh, or hip brought on by ambulation:    Pain in feet at night that wakes you up from your sleep:     Blood clot in your veins:    Leg swelling:         Pulmonary    Oxygen at home:    Productive cough:     Wheezing:         Neurologic    Sudden weakness in arms or legs:     Sudden numbness in arms or legs:     Sudden onset of difficulty speaking or slurred speech:    Temporary loss of vision in one eye:     Problems with dizziness:         Gastrointestinal    Blood in stool:     Vomited blood:         Genitourinary    Burning when urinating:     Blood in urine:        Psychiatric    Major depression:         Hematologic    Bleeding problems:    Problems with blood clotting too easily:        Skin    Rashes or ulcers:        Constitutional    Fever or chills:      PHYSICAL EXAM: Vitals:   02/21/20 0918  BP: 138/73  Pulse: 67  Resp: 20  Temp: 98 F (36.7 C)  SpO2: 93%  Weight: 203 lb (92.1 kg)  Height: 6' (1.829 m)    GENERAL: The patient is a well-nourished male, in no acute distress. The vital signs are documented above. CARDIOVASCULAR: 2+ radial pulses bilaterally.  Carotid arteries without bruits bilaterally.  He has 2+ femoral 2+ popliteal 2+ dorsalis pedis and 2+ posterior tibial pulses bilaterally PULMONARY: There is good air exchange  ABDOMEN: Soft and non-tender  MUSCULOSKELETAL: There  are no major deformities or cyanosis. NEUROLOGIC: No focal weakness or  paresthesias are detected. SKIN: There are no ulcers or rashes noted. PSYCHIATRIC: The patient has a normal affect.  DATA:  He underwent noninvasive laboratory studies in our office today.  This revealed normal ankle arm index bilaterally and normal triphasic waveforms in his tibial arteries bilaterally  MEDICAL ISSUES: Gust these findings with the patient.  Feel that we have ruled out arterial cause of his symptoms.  He was reassured with this discussion will see [place] again on an as-needed basis   Rosetta Posner, MD [facility] Tel [phone] Pager [phone]

## 2020-02-23 ENCOUNTER — Telehealth: Payer: Self-pay | Admitting: Family Medicine

## 2020-02-23 DIAGNOSIS — M79606 Pain in leg, unspecified: Secondary | ICD-10-CM

## 2020-02-23 NOTE — Telephone Encounter (Signed)
Patient willing to go to orthopedics.  His pain is bad.  Would like referral to some one close by but will take first appointment available.

## 2020-02-23 NOTE — Telephone Encounter (Signed)
  REFERRAL REQUEST Telephone Note 02/23/2020  What type of referral do you need? Hip pain--had a referral to see specialist on 02/21/2020. No relief and was not given pain medication  Have you been seen at our office for this problem? Yes--he says that he has talked to [facility] about this (Advise that they may need an appointment with their PCP before a referral can be done)  Is there a particular doctor or location that you prefer? No  Patient notified that referrals can take up to a week or longer to process. If they haven't heard anything within a week they should call back and speak with the referral department.

## 2020-02-23 NOTE — Telephone Encounter (Signed)
Please contact the patient. He should see ortho next. The doctor he saw, Dr. Donnetta Hutching said his circulation is good. Next step is to check out the joints and ligaments, etc.

## 2020-02-24 NOTE — Telephone Encounter (Signed)
Covering PCP- please advise  

## 2020-02-28 ENCOUNTER — Telehealth: Payer: Self-pay | Admitting: Family Medicine

## 2020-02-28 NOTE — Chronic Care Management (AMB) (Signed)
  Chronic Care Management   Note  02/28/2020 Name: Waylyn Tenbrink. MRN: [id number] DOB: 1941-11-03  Osamu Olguin. is a 79 y.o. year old male who is a primary care patient of [facility], Cletus Gash, MD. I reached out to [employer]. by phone today in response to a referral sent by Mr. [name]'s health plan.     Mr. Solivan was given information about Chronic Care Management services today including:  1. CCM service includes personalized support from designated clinical staff supervised by his physician, including individualized plan of care and coordination with other care providers 2. 24/7 contact phone numbers for assistance for urgent and routine care needs. 3. Service will only be billed when office clinical staff spend 20 minutes or more in a month to coordinate care. 4. Only one practitioner may furnish and bill the service in a calendar month. 5. The patient may stop CCM services at any time (effective at the end of the month) by phone call to the office staff. 6. The patient will be responsible for cost sharing (co-pay) of up to 20% of the service fee (after annual deductible is met).  Patient agreed to services and verbal consent obtained.   Follow up plan: Telephone appointment with care management team member scheduled for: 03/28/2020  [facility]  [place], [place] [address] Direct Dial: [email or web address] Website: [email or web address]

## 2020-03-02 ENCOUNTER — Ambulatory Visit (INDEPENDENT_AMBULATORY_CARE_PROVIDER_SITE_OTHER): Payer: Medicare Other | Admitting: Orthopaedic Surgery

## 2020-03-02 ENCOUNTER — Other Ambulatory Visit: Payer: Self-pay | Admitting: Family Medicine

## 2020-03-02 ENCOUNTER — Other Ambulatory Visit: Payer: Self-pay

## 2020-03-02 ENCOUNTER — Encounter: Payer: Self-pay | Admitting: Orthopaedic Surgery

## 2020-03-02 ENCOUNTER — Ambulatory Visit: Payer: Self-pay

## 2020-03-02 ENCOUNTER — Telehealth: Payer: Self-pay | Admitting: Orthopaedic Surgery

## 2020-03-02 DIAGNOSIS — M160 Bilateral primary osteoarthritis of hip: Secondary | ICD-10-CM | POA: Diagnosis not present

## 2020-03-02 NOTE — Progress Notes (Signed)
Office Visit Note   Patient: Joshua [facility].           Date of Birth: August 28, 1941           MRN: [id number] Visit Date: 03/02/2020              Requested by: Chevis Pretty, [place] [address] [place],  [place] [address] PCP: [name], MD   Assessment & Plan: Visit Diagnoses:  1. Bilateral primary osteoarthritis of hip     Plan: Impression is bilateral hip advanced degenerative joint disease and lumbar radiculopathy.  I believe the majority of the patient symptoms are coming from his hip.  The right does appear to be worse even though radiographically the left is more arthritic.  We will refer him to Dr. Junius Roads for ultrasound-guided cortisone injection to the right hip today.  We will follow up with [place] as needed.  Follow-Up Instructions: Return if symptoms worsen or fail to improve.   Orders:  Orders Placed This Encounter  Procedures  . XR HIP UNILAT W OR W/O PELVIS 2-3 VIEWS RIGHT  . US Guided Needle Placement - No Linked Charges   No orders of the defined types were placed in this encounter.     Procedures: No procedures performed   Clinical Data: No additional findings.   Subjective: Chief Complaint  Patient presents with  . Right Hip - Pain  . Left Hip - Pain    HPI patient is a very pleasant 79 year old gentleman who presents our clinic today with bilateral hip pain right greater than left.  No known injury or change in activity.  He does have a history of advanced degenerative changes to the left hip.  The pain he is having is primarily to the groin and anterior thigh but occasionally notes pain down the entire right leg and into the foot.  He has recently noticed weakness to the right lower extremity.  His pain is aggravated with going from a seated to standing position as well as when he is up moving around for period of time.  He has been taking Advil with mild relief of symptoms.  He does note occasional burning and tingling sensations to  the right lower extremity.  He also notes 2-3 previous lumbar surgeries by Dr. Lise Auer years ago.  He denies any bowel or bladder change and no saddle paresthesias.  Review of Systems as detailed in HPI.  All others reviewed and are negative.   Objective: Vital Signs: There were no vitals taken for this visit.  Physical Exam well-developed well-nourished gentleman in no acute distress.  Alert and oriented x3.  Ortho Exam examination of both hips reveals a positive logroll and positive FADIR.  Negative straight leg raise.  He is neurovascular intact distally.  Specialty Comments:  No specialty comments available.  Imaging: XR HIP UNILAT W OR W/O PELVIS 2-3 VIEWS RIGHT  Result Date: 03/02/2020 X-rays demonstrate marked degenerative changes to both hips worse on the left    PMFS History: Patient Active Problem List   Diagnosis Date Noted  . Thyromegaly 05/19/2017  . Neoplasm of scalp 12/08/2013  . Rhinitis 12/08/2013  . Cough 12/08/2013  . Hyperlipidemia   . Cancer ([place])   . Arthritis   . Severe stage glaucoma 03/26/2012  . Nuclear sclerosis 03/26/2012  . Primary open angle glaucoma 10/27/2011  . Essential hypertension 02/04/2011  . Degenerative disc disease 02/04/2011  . Gastrointestinal bleed 02/04/2011  . Anemia 02/04/2011  . Prostate cancer ([place])  02/04/2011   Past Medical History:  Diagnosis Date  . Arthritis   . Cancer [facility])    prostate ca   seed implants  . Glaucoma   . Hyperlipidemia   . Hypertension    dr Jacelyn Grip     Willeen Niece   fm    Family History  Problem Relation Age of Onset  . Cancer Mother        breast  . Heart disease Father   . Cancer Brother   . Cancer Brother     Past Surgical History:  Procedure Laterality Date  . BACK SURGERY     x2  . HERNIA REPAIR  11-06-2009   left inguinal repair   . NECK SURGERY    . PROSTATE SURGERY     prsatated cancer  . ROTATOR CUFF REPAIR Left jan 2016  . TRANSURETHRAL RESECTION OF PROSTATE     Social  History   Occupational History  . Occupation: Artist: retired  Tobacco Use  . Smoking status: Former Smoker    Packs/day: 0.50    Years: 30.00    Pack years: 15.00    Types: Cigarettes    Quit date: 03/28/2005    Years since quitting: 14.9  . Smokeless tobacco: Never Used  Substance and Sexual Activity  . Alcohol use: No    Alcohol/week: 0.0 standard drinks  . Drug use: No  . Sexual activity: Yes

## 2020-03-02 NOTE — Progress Notes (Signed)
Subjective: Patient is here for ultrasound-guided intra-articular right hip injection.   Severe groin pain due to DJD.  Objective:  Very limited ROM.  Procedure: Ultrasound-guided right hip injection: After sterile prep with Betadine, injected 8 cc 1% lidocaine without epinephrine and 40 mg methylprednisolone using a 22-gauge spinal needle, passing the needle through the iliofemoral ligament into the femoral head/neck junction.  Injectate seen filling joint capsule.  Return as directed.

## 2020-03-02 NOTE — Telephone Encounter (Signed)
Patient called. He says he needs some pain medication called in for him. Asked patient what pain medication he would like, he states any kind would do. His call back number is [phone]

## 2020-03-05 ENCOUNTER — Other Ambulatory Visit: Payer: Self-pay | Admitting: Physician Assistant

## 2020-03-05 MED ORDER — TRAMADOL HCL 50 MG PO TABS: 50.0000 mg | ORAL_TABLET | Freq: Three times a day (TID) | ORAL | 1 refills | Status: DC | PRN

## 2020-03-05 NOTE — Telephone Encounter (Signed)
Sent in tramadol

## 2020-03-05 NOTE — Telephone Encounter (Signed)
Called patient no answer LMOM-rx sent into pharm.

## 2020-03-12 DIAGNOSIS — M25551 Pain in right hip: Secondary | ICD-10-CM | POA: Diagnosis not present

## 2020-03-27 ENCOUNTER — Ambulatory Visit: Payer: Medicare Other | Admitting: Nurse Practitioner

## 2020-03-28 ENCOUNTER — Telehealth: Payer: Self-pay | Admitting: *Deleted

## 2020-03-28 ENCOUNTER — Ambulatory Visit (INDEPENDENT_AMBULATORY_CARE_PROVIDER_SITE_OTHER): Payer: Medicare Other | Admitting: Licensed Clinical Social Worker

## 2020-03-28 DIAGNOSIS — E782 Mixed hyperlipidemia: Secondary | ICD-10-CM

## 2020-03-28 DIAGNOSIS — I10 Essential (primary) hypertension: Secondary | ICD-10-CM

## 2020-03-28 DIAGNOSIS — D508 Other iron deficiency anemias: Secondary | ICD-10-CM

## 2020-03-28 DIAGNOSIS — C801 Malignant (primary) neoplasm, unspecified: Secondary | ICD-10-CM

## 2020-03-28 NOTE — Chronic Care Management (AMB) (Signed)
Chronic Care Management    Clinical Social Work Follow Up Note  03/28/2020 Name: Joshua Compton. MRN: [id number] DOB: 1941/01/30  Joshua Folkerts. is a 79 y.o. year old male who is a primary care patient of Stacks, Cletus Gash, MD. The CCM team was consulted for assistance with [employer] .   Review of patient status, including review of consultants reports, other relevant assessments, and collaboration with appropriate care team members and the patient's provider was performed as part of comprehensive patient evaluation and provision of chronic care management services.    SDOH (Social Determinants of Health) assessments performed: Yes; risk for depression; risk for stress    Chronic Care Management from 03/28/2020 in [place]  PHQ-9 Total Score  5     GAD 7 : Generalized Anxiety Score 03/28/2020  Nervous, Anxious, on Edge 0  Control/stop worrying 0  Worry too much - different things 0  Trouble relaxing 1  Restless 1  Easily annoyed or irritable 0  Afraid - awful might happen 0  Total GAD 7 Score 2  Anxiety Difficulty Somewhat difficult    Outpatient Encounter Medications as of 03/28/2020  Medication Sig Note  . celecoxib (CELEBREX) 200 MG capsule Take 1 capsule (200 mg total) by mouth 2 (two) times daily.   . dorzolamide-timolol (COSOPT) 22.3-6.8 MG/ML ophthalmic solution  08/28/2016: Received from: External Pharmacy  . Enalapril-hydroCHLOROthiazide 5-12.5 MG tablet Take 1 tablet by mouth daily.   . felodipine (PLENDIL) 5 MG 24 hr tablet Take 1 tablet (5 mg total) by mouth daily. For blood pressure   . fluticasone (FLONASE) 50 MCG/ACT nasal spray Place 1 spray into both nostrils 2 (two) times daily as needed for allergies or rhinitis.   . naproxen (NAPROSYN) 375 MG tablet Take 1 tablet (375 mg total) by mouth 2 (two) times daily.   . pantoprazole (PROTONIX) 40 MG tablet Take 1 tablet (40 mg total) by mouth daily. For heartburn   .  predniSONE (DELTASONE) 10 MG tablet Take 1 tablet (10 mg total) by mouth daily with breakfast. 2 tablets for 4 days, 1 tablet for 4 days   . rOPINIRole (REQUIP) 1 MG tablet Take 1 tablet (1 mg total) by mouth in the morning and at bedtime. For leg cramps   . rosuvastatin (CRESTOR) 5 MG tablet Take 1 tablet (5 mg total) by mouth daily. (Needs to be seen before next refill)   . traMADol (ULTRAM) 50 MG tablet Take 1 tablet (50 mg total) by mouth 3 (three) times daily as needed.    No facility-administered encounter medications on file as of 03/28/2020.    Goals Addressed            This Visit's Progress   . Client will talk with LCSW in next 30 days to discuss pain issues of client and client completion of daily activities (pt-stated)       [place] (see longitudinal plan of care for additional care plan information)  Current Barriers:  . Pain issues(pain in right leg) of client with Chronic Diagnoses of HLD, HTN, DDD, anemia, Cancer . Sleep issues  Clinical Social Work Clinical Goal(s):  Marland Kitchen LCSW will call client in next 30 days to discuss client pain issues and client completion of ADLs  Interventions: . Talked with client about CCM program . Talked with client about client appetite . Talked with client about pain issues (client said he has pain in groin area and pain in right leg) . Talked with  client about social support network (has support from his spouse and brother) . Talked with Joshua Compton about his upcoming medical appointments . Talked with Joshua Compton about DME in his home . Encouraged Joshua Compton to talk with [facility] about nursing needs of client . Talked with Joshua Compton about ambulation needs of client . Talked with Joshua Compton about right leg issues (he said his right leg feels heavy and it is more difficult to dress or drive sometimes due to right leg mobility.) . Talked with client about medication procurement (receives  prescribed medications  through [facility] in [place],  [place])  [place] with [facility] Triage Nurse about nursing needs of client   Patient Self Care Activities:  Does ADLs independently Takes medications as prescribed  Patient Self Care Deficits:  . Pain issues . Sleeping issues  Initial goal documentation      Follow Up Plan: LCSW to call client in next 4 weeks to talk with client about pain management issues for client and to talk about client completion of daily ADLs  [name].Tauno Falotico MSW, LCSW Licensed Clinical Social Worker [facility] Care Management [phone]

## 2020-03-28 NOTE — Patient Instructions (Addendum)
Licensed Clinical Social Worker Visit Information  Goals we discussed today:  Goals Addressed            This Visit's Progress   . Client will talk with LCSW in next 30 days to discuss pain issues of client and client completion of daily activities (pt-stated)       [place] (see longitudinal plan of care for additional care plan information)  Current Barriers:  . Pain issues(pain in right leg) of client with Chronic Diagnoses of HLD, HTN, DDD, Anemia, Cancer . Sleep issues  Clinical Social Work Clinical Goal(s):  Marland Kitchen LCSW will call client in next 30 days to discuss client pain issues and client completion of ADLs  Interventions: . Talked with client about CCM program . Talked with client about client appetite . Talked with client about pain issues (client said he has pain in groin area and pain in right leg) . Talked with client about social support network (has support from his spouse and brother) . Talked with [name] about his upcoming medical appointments . Talked with [name] about DME in his home . Encouraged [name] to talk with [facility] about nursing needs of client . Talked with [name] about ambulation needs of client . Talked with [name] about right leg issues (he said his right leg feels heavy and it is more difficult to dress or drive sometimes due to right leg mobility.) . Talked with client about medication procurement (receives  prescribed medications  through [facility] in [place], [place])  [place] with [facility] Triage Nurse about nursing needs of client   Patient Self Care Activities:  Does ADLs independently Takes medications as prescribed  Patient Self Care Deficits:  . Pain issues . Sleeping issues  Initial goal documentation        Materials Provided: No  Follow Up Plan: LCSW to call client in next 4 weeks to talk with client about pain management challenges of client and to talk with him about his completion of ADLs.  The patient verbalized  understanding of instructions provided today and declined a print copy of patient instruction materials.   [name] MSW, LCSW Licensed Clinical Social Worker [facility] Care Management [phone]

## 2020-03-28 NOTE — Telephone Encounter (Signed)
Received phone call from clinical social worker [facility] stating the he spoke with patient today and patient is complaining of right groin and right leg pain along with weakness/heaviness right leg.  Patient is having trouble bathing, dressing and sleeping due to pain.  Patient is also having difficulties driving when coming to a stop he has to physically pick up leg with hand and move over to brakes.  Patient has recently seen vascular and ortho.  Called patient to see if he had any further information for this nurse.  Patient states that he has seen vascular and ortho.  Joshua Compton states that this pain and heaviness has been going on for over a month.  Patient has an appt with PCP in 3 weeks and will call ortho to see if he can be seen in the meantime.

## 2020-03-28 NOTE — Telephone Encounter (Signed)
We can definitely see him. He may benefit from a hip replacement.  Thanks.

## 2020-04-04 ENCOUNTER — Encounter: Payer: Self-pay | Admitting: Orthopaedic Surgery

## 2020-04-04 ENCOUNTER — Other Ambulatory Visit: Payer: Self-pay

## 2020-04-04 ENCOUNTER — Ambulatory Visit: Payer: Medicare Other | Admitting: Orthopaedic Surgery

## 2020-04-04 VITALS — Ht 72.0 in | Wt 203.0 lb

## 2020-04-04 DIAGNOSIS — M1611 Unilateral primary osteoarthritis, right hip: Secondary | ICD-10-CM

## 2020-04-04 NOTE — Progress Notes (Signed)
Office Visit Note   Patient: Joshua Compton.           Date of Birth: 1941-01-27           MRN: [id number] Visit Date: 04/04/2020              Requested by: [name], MD [place],  [place] [address] PCP: [name], MD   Assessment & Plan: Visit Diagnoses:  1. Primary osteoarthritis of right hip     Plan: Impression is end-stage right hip DJD.  Unfortunately he has not received much relief from cortisone injections and any other form of conservative treatment.  After a thorough discussion on treatment options he has elected to proceed with a right total hip replacement in the near future.  We discussed at length the risk benefits rehab recovery.  All questions answered to his satisfaction.  We look forward to treating him in the operating room.  Follow-Up Instructions: Return for 2 week postop visit.   Orders:  No orders of the defined types were placed in this encounter.  No orders of the defined types were placed in this encounter.     Procedures: No procedures performed   Clinical Data: No additional findings.   Subjective: Chief Complaint  Patient presents with  . Right Hip - Pain    Joshua Compton comes back today with his wife for follow-up of bilateral hip pain worse on the right.  His previous cortisone injection did not help significantly.  Continues to have severe pain in the groin that radiates down the thigh.  Walking makes it worse and the pain is severe and constant.  No lower back pain.  The pain is causing his like to feel weak.   Review of Systems  Constitutional: Negative.   All other systems reviewed and are negative.    Objective: Vital Signs: Ht 6' (1.829 m)   Wt 203 lb (92.1 kg)   BMI 27.53 kg/m   Physical Exam Vitals and nursing note reviewed.  Constitutional:      Appearance: He is well-developed.  Pulmonary:     Effort: Pulmonary effort is normal.  Abdominal:     Palpations: Abdomen is soft.  Skin:  General: Skin is warm.  Neurological:     Mental Status: He is alert and oriented to person, place, and time.  Psychiatric:        Behavior: Behavior normal.        Thought Content: Thought content normal.        Judgment: Judgment normal.     Ortho Exam Right hip exam is unchanged. Specialty Comments:  No specialty comments available.  Imaging: No results found.   PMFS History: Patient Active Problem List   Diagnosis Date Noted  . Primary osteoarthritis of right hip 04/04/2020  . Thyromegaly 05/19/2017  . Neoplasm of scalp 12/08/2013  . Rhinitis 12/08/2013  . Cough 12/08/2013  . Hyperlipidemia   . Cancer ([place])   . Arthritis   . Severe stage glaucoma 03/26/2012  . Nuclear sclerosis 03/26/2012  . Primary open angle glaucoma 10/27/2011  . Essential hypertension 02/04/2011  . Degenerative disc disease 02/04/2011  . Gastrointestinal bleed 02/04/2011  . Anemia 02/04/2011  . Prostate cancer ([place]) 02/04/2011   Past Medical History:  Diagnosis Date  . Arthritis   . Cancer [facility])    prostate ca   seed implants  . Glaucoma   . Hyperlipidemia   . Hypertension    dr Jacelyn Grip  [place]   fm    Family History  Problem Relation Age of Onset  . Cancer Mother        breast  . Heart disease Father   . Cancer Brother   . Cancer Brother     Past Surgical History:  Procedure Laterality Date  . BACK SURGERY     x2  . HERNIA REPAIR  11-06-2009   left inguinal repair   . NECK SURGERY    . PROSTATE SURGERY     prsatated cancer  . ROTATOR CUFF REPAIR Left jan 2016  . TRANSURETHRAL RESECTION OF PROSTATE     Social History   Occupational History  . Occupation: Artist: retired  Tobacco Use  . Smoking status: Former Smoker    Packs/day: 0.50    Years: 30.00    Pack years: 15.00    Types: Cigarettes    Quit date: 03/28/2005    Years since quitting: 15.0  . Smokeless tobacco: Never Used  Substance and Sexual Activity  . Alcohol use: No     Alcohol/week: 0.0 standard drinks  . Drug use: No  . Sexual activity: Yes

## 2020-04-05 ENCOUNTER — Telehealth: Payer: Self-pay | Admitting: Orthopaedic Surgery

## 2020-04-05 MED ORDER — TRAMADOL HCL 50 MG PO TABS: 50.0000 mg | ORAL_TABLET | Freq: Every day | ORAL | 0 refills | Status: DC | PRN

## 2020-04-05 NOTE — Telephone Encounter (Signed)
Patient called asked if Dr Erlinda Hong called him any pain medicine? The number to contact patient is [phone] or [phone]

## 2020-04-05 NOTE — Telephone Encounter (Signed)
Pls advise. Thanks.  

## 2020-04-06 ENCOUNTER — Other Ambulatory Visit: Payer: Self-pay

## 2020-04-11 ENCOUNTER — Other Ambulatory Visit: Payer: Self-pay | Admitting: Family

## 2020-04-13 NOTE — Progress Notes (Signed)
[place], [address] [place] [address] Phone: [phone] Fax: [place], [address] [place] [address] Phone: [phone] Fax: [phone]      Your procedure is scheduled on Friday 04/20/2020.  Report to [facility] Main Entrance "A" at 11:00 A.M., and check in at the Admitting office.  Call this number if you have problems the morning of surgery:  [phone]  Call [phone] if you have any questions prior to your surgery date Monday-Friday 8am-4pm    Remember:  Do not eat after midnight the night before your surgery  You may drink clear liquids until 10:00 the morning of your surgery.   Clear liquids allowed are: Water, Non-Citrus Juices (without pulp), Carbonated Beverages, Clear Tea, Black Coffee Only, and Gatorade   Enhanced Recovery after Surgery for Orthopedics Enhanced Recovery after Surgery is a protocol used to improve the stress on your body and your recovery after surgery.  Patient Instructions   The night before surgery:  o No food after midnight. ONLY clear liquids after midnight     The day of surgery (if you do NOT have diabetes):  o Drink ONE (1) Pre-Surgery Clear Ensure as directed.   o This drink was given to you during your hospital  pre-op appointment visit. o The pre-op nurse will instruct you on the time to drink the  Pre-Surgery Ensure depending on your surgery time. o Finish the drink at the designated time by the pre-op nurse.  o Nothing else to drink after completing the  Pre-Surgery Clear Ensure.          If you have questions, please contact your surgeons office.     Take these medicines the morning of surgery with A SIP OF: Dorzolamide-timolol (Cosopt) Felodipine (Plendil) Pantoprazole (Protonix) Rosuvastatin (Crestor)  As of today, STOP taking any Aspirin (unless otherwise instructed by your surgeon)  and Aspirin containing products, Aleve, Naproxen, Ibuprofen, Motrin, Advil, Goody's, BC's, all herbal medications, fish oil, and all vitamins.                      Do not wear jewelry            Do not wear lotions, powders, colognes, or deodorant.            Men may shave face and neck.            Do not bring valuables to the hospital.            [facility] is not responsible for any belongings or valuables.  Do NOT Smoke (Tobacco/Vapping) or drink Alcohol 24 hours prior to your procedure  If you use a CPAP at night, you may bring all equipment for your overnight stay.   Contacts, glasses, dentures or bridgework may not be worn into surgery.      For patients admitted to the hospital, discharge time will be determined by your treatment team.   Patients discharged the day of surgery will not be allowed to drive home, and someone needs to stay with them for 24 hours.    Special instructions:   [facility]- Preparing For Surgery  Before surgery, you can play an important role. Because skin is not sterile, your skin needs to be as free of germs as possible. You can reduce the number of germs on your skin by washing with CHG (chlorahexidine  gluconate) Soap before surgery.  CHG is an antiseptic cleaner which kills germs and bonds with the skin to continue killing germs even after washing.    Oral Hygiene is also important to reduce your risk of infection.  Remember - BRUSH YOUR TEETH THE MORNING OF SURGERY WITH YOUR REGULAR TOOTHPASTE  Please do not use if you have an allergy to CHG or antibacterial soaps. If your skin becomes reddened/irritated stop using the CHG.  Do not shave (including legs and underarms) for at least 48 hours prior to first CHG shower. It is OK to shave your face.  Please follow these instructions carefully.   1. Shower the NIGHT BEFORE SURGERY and the MORNING OF SURGERY with CHG Soap.   2. If you chose to wash your hair, wash your hair first as usual with your  normal shampoo.  3. After you shampoo, rinse your hair and body thoroughly to remove the shampoo.  4. Use CHG as you would any other liquid soap. You can apply CHG directly to the skin and wash gently with a scrungie or a clean washcloth.   5. Apply the CHG Soap to your body ONLY FROM THE NECK DOWN.  Do not use on open wounds or open sores. Avoid contact with your eyes, ears, mouth and genitals (private parts). Wash Face and genitals (private parts)  with your normal soap.   6. Wash thoroughly, paying special attention to the area where your surgery will be performed.  7. Thoroughly rinse your body with warm water from the neck down.  8. DO NOT shower/wash with your normal soap after using and rinsing off the CHG Soap.  9. Pat yourself dry with a CLEAN TOWEL.  10. Wear CLEAN PAJAMAS to bed the night before surgery, wear comfortable clothes the morning of surgery  11. Place CLEAN SHEETS on your bed the night of your first shower and DO NOT SLEEP WITH PETS.   Day of Surgery: Shower with CHG soap as directed Do not apply any deodorants/lotions.  Please wear clean clothes to the hospital/surgery center.   Remember to brush your teeth WITH YOUR REGULAR TOOTHPASTE.   Please read over the following fact sheets that you were given.

## 2020-04-17 ENCOUNTER — Encounter (HOSPITAL_COMMUNITY): Payer: Self-pay

## 2020-04-17 ENCOUNTER — Other Ambulatory Visit: Payer: Self-pay

## 2020-04-17 ENCOUNTER — Other Ambulatory Visit (HOSPITAL_COMMUNITY)
Admission: RE | Admit: 2020-04-17 | Discharge: 2020-04-17 | Disposition: A | Discharge status: Home / Self Care | Payer: Medicare Other | Source: Ambulatory Visit | Attending: Orthopaedic Surgery | Admitting: Orthopaedic Surgery

## 2020-04-17 ENCOUNTER — Encounter (HOSPITAL_COMMUNITY)
Admission: RE | Admit: 2020-04-17 | Discharge: 2020-04-17 | Disposition: A | Discharge status: Home / Self Care | Payer: Medicare Other | Source: Ambulatory Visit | Attending: Orthopaedic Surgery | Admitting: Orthopaedic Surgery

## 2020-04-17 ENCOUNTER — Ambulatory Visit (HOSPITAL_COMMUNITY)
Admission: RE | Admit: 2020-04-17 | Discharge: 2020-04-17 | Disposition: A | Discharge status: Home / Self Care | Payer: Medicare Other | Source: Ambulatory Visit | Attending: Family | Admitting: Family

## 2020-04-17 DIAGNOSIS — N62 Hypertrophy of breast: Secondary | ICD-10-CM | POA: Diagnosis not present

## 2020-04-17 DIAGNOSIS — Z01818 Encounter for other preprocedural examination: Secondary | ICD-10-CM | POA: Diagnosis not present

## 2020-04-17 DIAGNOSIS — Z01811 Encounter for preprocedural respiratory examination: Secondary | ICD-10-CM

## 2020-04-17 DIAGNOSIS — Z20822 Contact with and (suspected) exposure to covid-19: Secondary | ICD-10-CM | POA: Insufficient documentation

## 2020-04-17 HISTORY — DX: Gastro-esophageal reflux disease without esophagitis: K21.9

## 2020-04-17 LAB — CBC
HCT: 45.4 % (ref 39.0–52.0)
Hemoglobin: 14.3 g/dL (ref 13.0–17.0)
MCH: 30.3 pg (ref 26.0–34.0)
MCHC: 31.5 g/dL (ref 30.0–36.0)
MCV: 96.2 fL (ref 80.0–100.0)
Platelets: 268 10*3/uL (ref 150–400)
RBC: 4.72 MIL/uL (ref 4.22–5.81)
RDW: 14.1 % (ref 11.5–15.5)
WBC: 6.7 10*3/uL (ref 4.0–10.5)
nRBC: 0 % (ref 0.0–0.2)

## 2020-04-17 LAB — PROTIME-INR
INR: 1.1 (ref 0.8–1.2)
Prothrombin Time: 13.4 seconds (ref 11.4–15.2)

## 2020-04-17 LAB — APTT: aPTT: 33 seconds (ref 24–36)

## 2020-04-17 LAB — URINALYSIS, ROUTINE W REFLEX MICROSCOPIC
Bacteria, UA: NONE SEEN
Bilirubin Urine: NEGATIVE
Glucose, UA: NEGATIVE mg/dL
Ketones, ur: NEGATIVE mg/dL
Leukocytes,Ua: NEGATIVE
Nitrite: NEGATIVE
Protein, ur: 30 mg/dL — AB
Specific Gravity, Urine: 1.015 (ref 1.005–1.030)
pH: 6 (ref 5.0–8.0)

## 2020-04-17 LAB — COMPREHENSIVE METABOLIC PANEL
ALT: 22 U/L (ref 0–44)
AST: 23 U/L (ref 15–41)
Albumin: 4 g/dL (ref 3.5–5.0)
Alkaline Phosphatase: 101 U/L (ref 38–126)
Anion gap: 13 (ref 5–15)
BUN: 15 mg/dL (ref 8–23)
CO2: 26 mmol/L (ref 22–32)
Calcium: 9.8 mg/dL (ref 8.9–10.3)
Chloride: 103 mmol/L (ref 98–111)
Creatinine, Ser: 1.24 mg/dL (ref 0.61–1.24)
GFR calc Af Amer: >60 mL/min (ref >60)
GFR calc non Af Amer: 55 mL/min — ABNORMAL LOW (ref >60)
Glucose, Bld: 98 mg/dL (ref 70–99)
Potassium: 3.3 mmol/L — ABNORMAL LOW (ref 3.5–5.1)
Sodium: 142 mmol/L (ref 135–145)
Total Bilirubin: 0.6 mg/dL (ref 0.3–1.2)
Total Protein: 7.8 g/dL (ref 6.5–8.1)

## 2020-04-17 LAB — SURGICAL PCR SCREEN
MRSA, PCR: NEGATIVE
Staphylococcus aureus: NEGATIVE

## 2020-04-17 LAB — SARS CORONAVIRUS 2 (TAT 6-24 HRS): SARS Coronavirus 2: NEGATIVE

## 2020-04-17 NOTE — Progress Notes (Signed)
Your procedure is scheduled on Friday 04/20/2020.  Report to [facility] Main Entrance "A" at 11:00 A.M., and check in at the Admitting office.  Call this number if you have problems the morning of surgery:  [phone]  Call [phone] if you have any questions prior to your surgery date Monday-Friday 8am-4pm    Remember:  Do not eat after midnight the night before your surgery  You may drink clear liquids until 10:00 the morning of your surgery.   Clear liquids allowed are: Water, Non-Citrus Juices (without pulp), Carbonated Beverages, Clear Tea, Black Coffee Only, and Gatorade   Enhanced Recovery after Surgery for Orthopedics Enhanced Recovery after Surgery is a protocol used to improve the stress on your body and your recovery after surgery.  Patient Instructions  . The night before surgery:  o No food after midnight. ONLY clear liquids after midnight  .  Marland Kitchen The day of surgery (if you do NOT have diabetes):  o Drink ONE (1) Pre-Surgery Clear Ensure as directed.   o This drink was given to you during your hospital  pre-op appointment visit. o The pre-op nurse will instruct you on the time to drink the  Pre-Surgery Ensure depending on your surgery time. o Finish the drink at the designated time by the pre-op nurse.  o Nothing else to drink after completing the  Pre-Surgery Clear Ensure.          If you have questions, please contact your surgeon's office.     Take these medicines the morning of surgery with A SIP OF: Dorzolamide-timolol (Cosopt) Felodipine (Plendil) Pantoprazole (Protonix) Rosuvastatin (Crestor)  As of today, STOP taking any Aspirin (unless otherwise instructed by your surgeon) and Aspirin containing products, Aleve, Naproxen, Ibuprofen, Motrin, Advil, Goody's, BC's, all herbal medications, fish oil, and all vitamins.       Special instructions:   [place]- Preparing For Surgery  Before surgery, you can play an important role. Because skin  is not sterile, your skin needs to be as free of germs as possible. You can reduce the number of germs on your skin by washing with CHG (chlorahexidine gluconate) Soap before surgery.  CHG is an antiseptic cleaner which kills germs and bonds with the skin to continue killing germs even after washing.    Oral Hygiene is also important to reduce your risk of infection.  Remember - BRUSH YOUR TEETH THE MORNING OF SURGERY WITH YOUR REGULAR TOOTHPASTE  Please do not use if you have an allergy to CHG or antibacterial soaps. If your skin becomes reddened/irritated stop using the CHG.  Do not shave (including legs and underarms) for at least 48 hours prior to first CHG shower. It is OK to shave your face.  Please follow these instructions carefully.   1. Shower the NIGHT BEFORE SURGERY and the MORNING OF SURGERY with CHG Soap.   2. If you chose to wash your hair, wash your hair first as usual with your normal shampoo.  3. After you shampoo,wash your face and private area with the soap you use at home, then rinse your hair and body thoroughly to remove the shampoo and soap  4. Use CHG as you would any other liquid soap. You can apply CHG directly to the skin and wash gently with a scrungie or a clean washcloth.   Apply the CHG Soap to your body ONLY FROM THE NECK DOWN.  Do not use on open wounds or open sores. Avoid contact with your  eyes, ears, mouth and genitals (private parts).  5. Wash thoroughly, paying special attention to the area where your surgery will be performed.  6. Thoroughly rinse your body with warm water from the neck down.  7. DO NOT shower/wash with your normal soap after using and rinsing off the CHG Soap.  8. Pat yourself dry with a CLEAN TOWEL.  9. Wear CLEAN PAJAMAS to bed the night before surgery, wear comfortable clothes the morning of surgery  Place CLEAN SHEETS on your bed the night of your first shower and DO NOT SLEEP WITH PETS.  Day of Surgery: Shower as instructed  above. Do not apply any deodorants/lotions.  Please wear clean clothes to the hospital/surgery center.   Remember to brush your teeth WITH YOUR REGULAR TOOTHPASTE. Do Not wear jewelry, makeup. Do not bring any valuable to the hospital with you [facility] is not responsible for valuables or personnel belongings.  Contacts, Dentures, Partials, Hearing Aids are not allowed in the Operating Room, Please bring a case for glasses/ contacts.  Please read over the following fact sheets that you were given.

## 2020-04-17 NOTE — Progress Notes (Addendum)
PCP - Dr, [name]  Cardiologist - no  Chest x-ray - 04/17/2020  EKG - 04/17/2020  Stress Test - no   ECHO - no  Cardiac Cath - no  Sleep Study - no CPAP - no  LABS- CBC, CMP,  PT, PTT, UA, PCR  ASA-no  ERAS- Yes-   HA1C-NA Fasting Blood Sugar - NA Checks Blood Sugar ___NA__ times a day  Anesthesia-  Pt denies having chest pain, sob, or fever at this time. All instructions explained to the pt, with a verbal understanding of the material. Pt agrees to go over the instructions while at home for a better understanding. Pt also instructed to self quarantine after being tested for COVID-19. The opportunity to ask questions was provided.

## 2020-04-18 ENCOUNTER — Ambulatory Visit (INDEPENDENT_AMBULATORY_CARE_PROVIDER_SITE_OTHER): Payer: Medicare Other | Admitting: Family Medicine

## 2020-04-18 ENCOUNTER — Encounter: Payer: Self-pay | Admitting: Family Medicine

## 2020-04-18 VITALS — BP 131/74 | HR 69 | Temp 98.0°F | Ht 72.0 in | Wt 200.6 lb

## 2020-04-18 DIAGNOSIS — I10 Essential (primary) hypertension: Secondary | ICD-10-CM

## 2020-04-18 DIAGNOSIS — E782 Mixed hyperlipidemia: Secondary | ICD-10-CM | POA: Diagnosis not present

## 2020-04-18 DIAGNOSIS — K219 Gastro-esophageal reflux disease without esophagitis: Secondary | ICD-10-CM | POA: Diagnosis not present

## 2020-04-18 DIAGNOSIS — M1611 Unilateral primary osteoarthritis, right hip: Secondary | ICD-10-CM | POA: Diagnosis not present

## 2020-04-18 DIAGNOSIS — M1990 Unspecified osteoarthritis, unspecified site: Secondary | ICD-10-CM | POA: Diagnosis not present

## 2020-04-18 MED ORDER — ROSUVASTATIN CALCIUM 5 MG PO TABS: 5.0000 mg | ORAL_TABLET | Freq: Every day | ORAL | 0 refills | Status: DC

## 2020-04-18 MED ORDER — FELODIPINE ER 5 MG PO TB24: 5.0000 mg | ORAL_TABLET | Freq: Every day | ORAL | 1 refills | Status: DC

## 2020-04-18 MED ORDER — PANTOPRAZOLE SODIUM 40 MG PO TBEC: 40.0000 mg | DELAYED_RELEASE_TABLET | Freq: Every day | ORAL | 1 refills | Status: DC

## 2020-04-18 MED ORDER — ENALAPRIL-HYDROCHLOROTHIAZIDE 5-12.5 MG PO TABS: 1.0000 | ORAL_TABLET | Freq: Every day | ORAL | 1 refills | Status: DC

## 2020-04-18 NOTE — Progress Notes (Signed)
Subjective:  Patient ID: Joshua Guiles., male    DOB: 1941-05-23  Age: 79 y.o. MRN: [id number]  CC: Follow-up (3 month)   HPI Joshua Compton. presents for  follow-up of hypertension. Patient has no history of headache chest pain or shortness of breath or recent cough. Patient also denies symptoms of TIA such as focal numbness or weakness. Patient denies side effects from medication. States taking it regularly except that he ran out of one..  Right hip replacement coming up in just 2 days.  He has discontinued all of his NSAIDs etc.  He does continue to take his antihypertensives and is cholesterol pill, Crestor as well as the Protonix.  Everything else has been DC'd.  He got a clear report from vascular with regard to claudication and was further referred to Ortho and they found that his pain was related to arthritis in the hips therefore surgery was planned.  He will eventually get a left hip replacement as well.  Patient in for follow-up of GERD. Currently asymptomatic taking  PPI daily. There is no chest pain or heartburn. No hematemesis and no melena. No dysphagia or choking. Onset is remote. Progression is stable. Complicating factors, none.  Patient in for follow-up of elevated cholesterol. Doing well without complaints on current medication. Denies side effects of statin including myalgia and arthralgia and nausea. Also in today for liver function testing. Currently no chest pain, shortness of breath or other cardiovascular related symptoms noted.Marland Kitchen   History Joshua Compton has a past medical history of Arthritis, Cancer ([place]), GERD (gastroesophageal reflux disease), Glaucoma, Hyperlipidemia, and Hypertension.   He has a past surgical history that includes Back surgery; Neck surgery; Transurethral resection of prostate; Hernia repair (11-06-2009); Rotator cuff repair (Left, jan 2016); and Colonoscopy.   His family history includes Cancer in his brother, brother, and mother; Heart  disease in his father.He reports that he quit smoking about 15 years ago. His smoking use included cigarettes. He has a 15.00 pack-year smoking history. He has never used smokeless tobacco. He reports that he does not drink alcohol or use drugs.  Current Outpatient Medications on File Prior to Visit  Medication Sig Dispense Refill  . dorzolamide-timolol (COSOPT) 22.3-6.8 MG/ML ophthalmic solution Place 1 drop into both eyes daily.     . fluticasone (FLONASE) 50 MCG/ACT nasal spray Place 1 spray into both nostrils 2 (two) times daily as needed for allergies or rhinitis. (Patient not taking: Reported on 04/10/2020) 16 g 6   No current facility-administered medications on file prior to visit.    ROS Review of Systems  Constitutional: Negative for fever.  Respiratory: Negative for shortness of breath.   Cardiovascular: Negative for chest pain.  Gastrointestinal: Negative for abdominal pain.  Musculoskeletal: Positive for arthralgias (hip and leg pain).  Skin: Negative for rash.    Objective:  BP 131/74   Pulse 69   Temp 98 F (36.7 C) (Temporal)   Ht 6' (1.829 m)   Wt 200 lb 9.6 oz (91 kg)   BMI 27.21 kg/m   BP Readings from Last 3 Encounters:  04/18/20 131/74  04/17/20 (!) 144/71  02/21/20 138/73    Wt Readings from Last 3 Encounters:  04/18/20 200 lb 9.6 oz (91 kg)  04/17/20 198 lb 6.4 oz (90 kg)  04/04/20 203 lb (92.1 kg)     Physical Exam Constitutional:      General: He is not in acute distress.    Appearance: He is well-developed.  HENT:  Head: Normocephalic and atraumatic.     Right Ear: External ear normal.     Left Ear: External ear normal.     Nose: Nose normal.  Eyes:     Conjunctiva/sclera: Conjunctivae normal.     Pupils: Pupils are equal, round, and reactive to light.  Cardiovascular:     Rate and Rhythm: Normal rate and regular rhythm.     Heart sounds: Normal heart sounds. No murmur.  Pulmonary:     Effort: Pulmonary effort is normal. No  respiratory distress.     Breath sounds: Normal breath sounds. No wheezing or rales.  Abdominal:     Palpations: Abdomen is soft.     Tenderness: There is no abdominal tenderness.  Musculoskeletal:     Cervical back: Normal range of motion and neck supple.     Comments: Antalgic gait with limp   Skin:    General: Skin is warm and dry.  Neurological:     Mental Status: He is alert and oriented to person, place, and time.     Deep Tendon Reflexes: Reflexes are normal and symmetric.  Psychiatric:        Behavior: Behavior normal.        Thought Content: Thought content normal.        Judgment: Judgment normal.       Assessment & Plan:   Kell was seen today for follow-up.  Diagnoses and all orders for this visit:  Essential hypertension -     Enalapril-hydroCHLOROthiazide 5-12.5 MG tablet; Take 1 tablet by mouth daily. -     felodipine (PLENDIL) 5 MG 24 hr tablet; Take 1 tablet (5 mg total) by mouth daily. For blood pressure  Mixed hyperlipidemia  Primary osteoarthritis of right hip  Arthritis  Gastroesophageal reflux disease -     pantoprazole (PROTONIX) 40 MG tablet; Take 1 tablet (40 mg total) by mouth daily. For heartburn  Other orders -     rosuvastatin (CRESTOR) 5 MG tablet; Take 1 tablet (5 mg total) by mouth daily. (Needs to be seen before next refill)   Allergies as of 04/18/2020      Reactions   Pravastatin Sodium    Muscle aches      Medication List       Accurate as of April 18, 2020  8:44 AM. If you have any questions, ask your nurse or doctor.        STOP taking these medications   celecoxib 200 MG capsule Commonly known as: CELEBREX Stopped by: [name], MD   naproxen 375 MG tablet Commonly known as: NAPROSYN Stopped by: [name], MD   predniSONE 10 MG tablet Commonly known as: DELTASONE Stopped by: [name], MD   rOPINIRole 1 MG tablet Commonly known as: REQUIP Stopped by: [name], MD   traMADol 50 MG  tablet Commonly known as: ULTRAM Stopped by: [name], MD     TAKE these medications   dorzolamide-timolol 22.3-6.8 MG/ML ophthalmic solution Commonly known as: COSOPT Place 1 drop into both eyes daily.   Enalapril-hydroCHLOROthiazide 5-12.5 MG tablet Take 1 tablet by mouth daily.   felodipine 5 MG 24 hr tablet Commonly known as: PLENDIL Take 1 tablet (5 mg total) by mouth daily. For blood pressure   fluticasone 50 MCG/ACT nasal spray Commonly known as: FLONASE Place 1 spray into both nostrils 2 (two) times daily as needed for allergies or rhinitis.   pantoprazole 40 MG tablet Commonly known as: PROTONIX Take 1 tablet (40 mg total)  by mouth daily. For heartburn   rosuvastatin 5 MG tablet Commonly known as: CRESTOR Take 1 tablet (5 mg total) by mouth daily. (Needs to be seen before next refill)       Meds ordered this encounter  Medications  . Enalapril-hydroCHLOROthiazide 5-12.5 MG tablet    Sig: Take 1 tablet by mouth daily.    Dispense:  90 tablet    Refill:  1  . felodipine (PLENDIL) 5 MG 24 hr tablet    Sig: Take 1 tablet (5 mg total) by mouth daily. For blood pressure    Dispense:  90 tablet    Refill:  1  . pantoprazole (PROTONIX) 40 MG tablet    Sig: Take 1 tablet (40 mg total) by mouth daily. For heartburn    Dispense:  90 tablet    Refill:  1  . rosuvastatin (CRESTOR) 5 MG tablet    Sig: Take 1 tablet (5 mg total) by mouth daily. (Needs to be seen before next refill)    Dispense:  30 tablet    Refill:  0    His hip replacement should make a big difference for his pain.  We will follow him up in 6 weeks to reassess whether he will need arthritis medicines and look again at his use of tramadol.  Follow-up: Return in about 6 weeks (around 05/30/2020).  [name], M.D.

## 2020-04-19 ENCOUNTER — Telehealth: Payer: Self-pay | Admitting: *Deleted

## 2020-04-19 ENCOUNTER — Other Ambulatory Visit: Payer: Self-pay | Admitting: Orthopaedic Surgery

## 2020-04-19 MED ORDER — ASPIRIN EC 81 MG PO TBEC
81.0000 mg | DELAYED_RELEASE_TABLET | Freq: Two times a day (BID) | ORAL | 0 refills | Status: DC
Start: 2020-04-19 — End: 2020-12-11

## 2020-04-19 MED ORDER — METHOCARBAMOL 750 MG PO TABS
750.0000 mg | ORAL_TABLET | Freq: Two times a day (BID) | ORAL | 3 refills | Status: DC | PRN
Start: 2020-04-19 — End: 2020-05-04

## 2020-04-19 MED ORDER — ONDANSETRON HCL 4 MG PO TABS: 4.0000 mg | ORAL_TABLET | Freq: Three times a day (TID) | ORAL | 0 refills | Status: DC | PRN

## 2020-04-19 MED ORDER — OXYCODONE HCL 5 MG PO TABS: 5.0000 mg | ORAL_TABLET | Freq: Three times a day (TID) | ORAL | 0 refills | Status: DC | PRN

## 2020-04-19 MED ORDER — BUPIVACAINE LIPOSOME 1.3 % IJ SUSP
20.0000 mL | Freq: Once | INTRAMUSCULAR | Status: DC
  Filled 2020-04-19: qty 20

## 2020-04-19 MED ORDER — TRANEXAMIC ACID 1000 MG/10ML IV SOLN
2000.0000 mg | INTRAVENOUS | Status: DC
  Filled 2020-04-19: qty 20

## 2020-04-19 NOTE — Care Plan (Signed)
RNCM call to patient pre-operatively to discuss his upcoming Right THA with Dr. Erlinda Hong. Patient is an Ortho bundle patient through [facility] and is agreeable to case management. Reviewed all post-op care instructions with him and his wife. His wife will be available to assist him at home after a brief hospital stay. Anticipate HHPT will be needed. Referral made [facility] after choice provided. Patient has handicap toilets, but will need a FWW. Referral made to [facility]. Will continue to follow for all CM needs.

## 2020-04-19 NOTE — Discharge Instructions (Signed)

## 2020-04-19 NOTE — Telephone Encounter (Signed)
Ortho bundle pre-op call completed. 

## 2020-04-20 ENCOUNTER — Observation Stay (HOSPITAL_COMMUNITY)
Admission: RE | Admit: 2020-04-20 | Discharge: 2020-04-21 | Disposition: A | Discharge status: Home Health | Payer: Medicare Other | Attending: Orthopaedic Surgery | Admitting: Orthopaedic Surgery

## 2020-04-20 ENCOUNTER — Other Ambulatory Visit: Payer: Self-pay | Admitting: *Deleted

## 2020-04-20 ENCOUNTER — Ambulatory Visit (HOSPITAL_COMMUNITY): Payer: Medicare Other | Admitting: Anesthesiology

## 2020-04-20 ENCOUNTER — Encounter (HOSPITAL_COMMUNITY): Payer: Self-pay | Admitting: Orthopaedic Surgery

## 2020-04-20 ENCOUNTER — Ambulatory Visit (HOSPITAL_COMMUNITY): Payer: Medicare Other

## 2020-04-20 ENCOUNTER — Observation Stay (HOSPITAL_COMMUNITY): Payer: Medicare Other

## 2020-04-20 ENCOUNTER — Other Ambulatory Visit: Payer: Self-pay

## 2020-04-20 ENCOUNTER — Encounter (HOSPITAL_COMMUNITY)
Admission: RE | Disposition: A | Discharge status: Home Health | Payer: Self-pay | Source: Home / Self Care | Attending: Orthopaedic Surgery

## 2020-04-20 DIAGNOSIS — M169 Osteoarthritis of hip, unspecified: Secondary | ICD-10-CM | POA: Diagnosis present

## 2020-04-20 DIAGNOSIS — Z8546 Personal history of malignant neoplasm of prostate: Secondary | ICD-10-CM | POA: Insufficient documentation

## 2020-04-20 DIAGNOSIS — Z87891 Personal history of nicotine dependence: Secondary | ICD-10-CM | POA: Diagnosis not present

## 2020-04-20 DIAGNOSIS — M1611 Unilateral primary osteoarthritis, right hip: Secondary | ICD-10-CM

## 2020-04-20 DIAGNOSIS — I10 Essential (primary) hypertension: Secondary | ICD-10-CM | POA: Insufficient documentation

## 2020-04-20 DIAGNOSIS — Z471 Aftercare following joint replacement surgery: Secondary | ICD-10-CM | POA: Diagnosis not present

## 2020-04-20 DIAGNOSIS — Z79899 Other long term (current) drug therapy: Secondary | ICD-10-CM | POA: Insufficient documentation

## 2020-04-20 DIAGNOSIS — H409 Unspecified glaucoma: Secondary | ICD-10-CM | POA: Diagnosis not present

## 2020-04-20 DIAGNOSIS — Z7982 Long term (current) use of aspirin: Secondary | ICD-10-CM | POA: Insufficient documentation

## 2020-04-20 DIAGNOSIS — Z96641 Presence of right artificial hip joint: Secondary | ICD-10-CM | POA: Diagnosis present

## 2020-04-20 DIAGNOSIS — D62 Acute posthemorrhagic anemia: Secondary | ICD-10-CM | POA: Diagnosis not present

## 2020-04-20 DIAGNOSIS — D649 Anemia, unspecified: Secondary | ICD-10-CM | POA: Diagnosis not present

## 2020-04-20 DIAGNOSIS — Z96649 Presence of unspecified artificial hip joint: Secondary | ICD-10-CM

## 2020-04-20 DIAGNOSIS — Z419 Encounter for procedure for purposes other than remedying health state, unspecified: Secondary | ICD-10-CM

## 2020-04-20 DIAGNOSIS — K219 Gastro-esophageal reflux disease without esophagitis: Secondary | ICD-10-CM | POA: Diagnosis not present

## 2020-04-20 DIAGNOSIS — E785 Hyperlipidemia, unspecified: Secondary | ICD-10-CM | POA: Diagnosis not present

## 2020-04-20 HISTORY — PX: TOTAL HIP ARTHROPLASTY: SHX124

## 2020-04-20 SURGERY — ARTHROPLASTY, HIP, TOTAL, ANTERIOR APPROACH
Anesthesia: Spinal | Site: Hip | Laterality: Right

## 2020-04-20 MED ORDER — ASPIRIN 81 MG PO CHEW
81.0000 mg | CHEWABLE_TABLET | Freq: Two times a day (BID) | ORAL | Status: DC
  Administered 2020-04-21: 81 mg via ORAL
  Filled 2020-04-20: qty 1

## 2020-04-20 MED ORDER — OXYCODONE HCL 5 MG PO TABS: 5.0000 mg | ORAL_TABLET | Freq: Once | ORAL | Status: DC | PRN

## 2020-04-20 MED ORDER — DIPHENHYDRAMINE HCL 12.5 MG/5ML PO ELIX
25.0000 mg | ORAL_SOLUTION | ORAL | Status: DC | PRN
  Administered 2020-04-20: 25 mg via ORAL
  Filled 2020-04-20: qty 10

## 2020-04-20 MED ORDER — MENTHOL 3 MG MT LOZG: 1.0000 | LOZENGE | OROMUCOSAL | Status: DC | PRN

## 2020-04-20 MED ORDER — METOCLOPRAMIDE HCL 5 MG PO TABS: 5.0000 mg | ORAL_TABLET | Freq: Three times a day (TID) | ORAL | Status: DC | PRN

## 2020-04-20 MED ORDER — HYDROMORPHONE HCL 1 MG/ML IJ SOLN
INTRAMUSCULAR | Status: AC
  Filled 2020-04-20: qty 1

## 2020-04-20 MED ORDER — GABAPENTIN 300 MG PO CAPS
300.0000 mg | ORAL_CAPSULE | Freq: Three times a day (TID) | ORAL | Status: DC
  Administered 2020-04-20 – 2020-04-21 (×2): 300 mg via ORAL
  Filled 2020-04-20 (×2): qty 1

## 2020-04-20 MED ORDER — ROSUVASTATIN CALCIUM 5 MG PO TABS
5.0000 mg | ORAL_TABLET | Freq: Every day | ORAL | Status: DC
  Administered 2020-04-21: 5 mg via ORAL
  Filled 2020-04-20: qty 1

## 2020-04-20 MED ORDER — PHENYLEPHRINE HCL-NACL 10-0.9 MG/250ML-% IV SOLN
INTRAVENOUS | Status: DC | PRN
Start: 2020-04-20 — End: 2020-04-20
  Administered 2020-04-20: 20 ug/min via INTRAVENOUS

## 2020-04-20 MED ORDER — BUPIVACAINE HCL (PF) 0.5 % IJ SOLN
INTRAMUSCULAR | Status: DC | PRN
  Administered 2020-04-20: 20 mL

## 2020-04-20 MED ORDER — POLYETHYLENE GLYCOL 3350 17 G PO PACK: 17.0000 g | PACK | Freq: Every day | ORAL | Status: DC | PRN

## 2020-04-20 MED ORDER — ACETAMINOPHEN 325 MG PO TABS
325.0000 mg | ORAL_TABLET | Freq: Four times a day (QID) | ORAL | Status: DC | PRN
  Administered 2020-04-20: 650 mg via ORAL

## 2020-04-20 MED ORDER — OXYCODONE HCL 5 MG PO TABS: 5.0000 mg | ORAL_TABLET | ORAL | Status: DC | PRN

## 2020-04-20 MED ORDER — PROPOFOL 500 MG/50ML IV EMUL
INTRAVENOUS | Status: DC | PRN
  Administered 2020-04-20: 75 ug/kg/min via INTRAVENOUS

## 2020-04-20 MED ORDER — PROPOFOL 10 MG/ML IV BOLUS
INTRAVENOUS | Status: AC
  Filled 2020-04-20: qty 20

## 2020-04-20 MED ORDER — ACETAMINOPHEN 500 MG PO TABS
1000.0000 mg | ORAL_TABLET | Freq: Four times a day (QID) | ORAL | Status: DC
  Administered 2020-04-20 – 2020-04-21 (×3): 1000 mg via ORAL
  Filled 2020-04-20 (×3): qty 2

## 2020-04-20 MED ORDER — SORBITOL 70 % SOLN: 30.0000 mL | Freq: Every day | Status: DC | PRN

## 2020-04-20 MED ORDER — PANTOPRAZOLE SODIUM 40 MG PO TBEC
40.0000 mg | DELAYED_RELEASE_TABLET | Freq: Every day | ORAL | Status: DC
  Administered 2020-04-21: 40 mg via ORAL
  Filled 2020-04-20: qty 1

## 2020-04-20 MED ORDER — ENALAPRIL-HYDROCHLOROTHIAZIDE 5-12.5 MG PO TABS: 1.0000 | ORAL_TABLET | Freq: Every day | ORAL | Status: DC

## 2020-04-20 MED ORDER — TRANEXAMIC ACID 1000 MG/10ML IV SOLN
INTRAVENOUS | Status: DC | PRN
  Administered 2020-04-20: 2000 mg via TOPICAL

## 2020-04-20 MED ORDER — OXYCODONE HCL 5 MG PO TABS
5.0000 mg | ORAL_TABLET | Freq: Once | ORAL | Status: DC | PRN
Start: 2020-04-20 — End: 2020-04-20

## 2020-04-20 MED ORDER — CEFAZOLIN SODIUM-DEXTROSE 2-4 GM/100ML-% IV SOLN
2.0000 g | Freq: Four times a day (QID) | INTRAVENOUS | Status: AC
  Administered 2020-04-20 – 2020-04-21 (×3): 2 g via INTRAVENOUS
  Filled 2020-04-20 (×3): qty 100

## 2020-04-20 MED ORDER — BUPIVACAINE LIPOSOME 1.3 % IJ SUSP
20.0000 mL | Freq: Once | INTRAMUSCULAR | Status: AC
  Administered 2020-04-20: 20 mL
  Filled 2020-04-20: qty 20

## 2020-04-20 MED ORDER — SODIUM CHLORIDE 0.9 % IR SOLN
Status: DC | PRN
  Administered 2020-04-20: 3000 mL

## 2020-04-20 MED ORDER — OXYCODONE HCL 5 MG/5ML PO SOLN
5.0000 mg | Freq: Once | ORAL | Status: DC | PRN
Start: 2020-04-20 — End: 2020-04-20

## 2020-04-20 MED ORDER — DOCUSATE SODIUM 100 MG PO CAPS
100.0000 mg | ORAL_CAPSULE | Freq: Two times a day (BID) | ORAL | Status: DC
  Administered 2020-04-20 – 2020-04-21 (×2): 100 mg via ORAL
  Filled 2020-04-20 (×2): qty 1

## 2020-04-20 MED ORDER — 0.9 % SODIUM CHLORIDE (POUR BTL) OPTIME
TOPICAL | Status: DC | PRN
  Administered 2020-04-20: 1000 mL

## 2020-04-20 MED ORDER — TRANEXAMIC ACID-NACL 1000-0.7 MG/100ML-% IV SOLN
1000.0000 mg | INTRAVENOUS | Status: AC
  Administered 2020-04-20: 1000 mg via INTRAVENOUS

## 2020-04-20 MED ORDER — VANCOMYCIN HCL 1 G IV SOLR
INTRAVENOUS | Status: DC | PRN
  Administered 2020-04-20: 1000 mg via TOPICAL

## 2020-04-20 MED ORDER — HYDROMORPHONE HCL 1 MG/ML IJ SOLN: 0.5000 mg | INTRAMUSCULAR | Status: DC | PRN

## 2020-04-20 MED ORDER — DEXAMETHASONE SODIUM PHOSPHATE 10 MG/ML IJ SOLN
10.0000 mg | Freq: Once | INTRAMUSCULAR | Status: AC
  Administered 2020-04-21: 10 mg via INTRAVENOUS
  Filled 2020-04-20: qty 1

## 2020-04-20 MED ORDER — SODIUM CHLORIDE 0.9% FLUSH
INTRAVENOUS | Status: DC | PRN
  Administered 2020-04-20: 20 mL

## 2020-04-20 MED ORDER — ENALAPRIL MALEATE 5 MG PO TABS
5.0000 mg | ORAL_TABLET | Freq: Every day | ORAL | Status: DC
  Administered 2020-04-21: 5 mg via ORAL
  Filled 2020-04-20: qty 1

## 2020-04-20 MED ORDER — PHENOL 1.4 % MT LIQD: 1.0000 | OROMUCOSAL | Status: DC | PRN

## 2020-04-20 MED ORDER — POVIDONE-IODINE 10 % EX SWAB: 2.0000 "application " | Freq: Once | CUTANEOUS | Status: DC

## 2020-04-20 MED ORDER — CHLORHEXIDINE GLUCONATE 0.12 % MT SOLN
OROMUCOSAL | Status: AC
  Administered 2020-04-20: 15 mL via OROMUCOSAL
  Filled 2020-04-20: qty 15

## 2020-04-20 MED ORDER — CEFAZOLIN SODIUM-DEXTROSE 2-4 GM/100ML-% IV SOLN
INTRAVENOUS | Status: AC
  Filled 2020-04-20: qty 100

## 2020-04-20 MED ORDER — TRANEXAMIC ACID-NACL 1000-0.7 MG/100ML-% IV SOLN
INTRAVENOUS | Status: AC
  Filled 2020-04-20: qty 100

## 2020-04-20 MED ORDER — ONDANSETRON HCL 4 MG PO TABS: 4.0000 mg | ORAL_TABLET | Freq: Four times a day (QID) | ORAL | Status: DC | PRN

## 2020-04-20 MED ORDER — HYDROMORPHONE HCL 1 MG/ML IJ SOLN
0.2500 mg | INTRAMUSCULAR | Status: DC | PRN
  Administered 2020-04-20 (×2): 1 mg via INTRAVENOUS

## 2020-04-20 MED ORDER — OXYCODONE HCL 5 MG/5ML PO SOLN: 5.0000 mg | Freq: Once | ORAL | Status: DC | PRN

## 2020-04-20 MED ORDER — IRRISEPT - 450ML BOTTLE WITH 0.05% CHG IN STERILE WATER, USP 99.95% OPTIME
TOPICAL | Status: DC | PRN
  Administered 2020-04-20: 450 mL

## 2020-04-20 MED ORDER — VANCOMYCIN HCL 1000 MG IV SOLR
INTRAVENOUS | Status: AC
  Filled 2020-04-20: qty 1000

## 2020-04-20 MED ORDER — HYDROCHLOROTHIAZIDE 12.5 MG PO CAPS
12.5000 mg | ORAL_CAPSULE | Freq: Every day | ORAL | Status: DC
  Administered 2020-04-21: 12.5 mg via ORAL
  Filled 2020-04-20: qty 1

## 2020-04-20 MED ORDER — LACTATED RINGERS IV SOLN: INTRAVENOUS | Status: DC

## 2020-04-20 MED ORDER — PROMETHAZINE HCL 25 MG/ML IJ SOLN
INTRAMUSCULAR | Status: AC
  Filled 2020-04-20: qty 1

## 2020-04-20 MED ORDER — CEFAZOLIN SODIUM-DEXTROSE 2-4 GM/100ML-% IV SOLN
2.0000 g | INTRAVENOUS | Status: AC
  Administered 2020-04-20: 2 g via INTRAVENOUS

## 2020-04-20 MED ORDER — CHLORHEXIDINE GLUCONATE 0.12 % MT SOLN: 15.0000 mL | Freq: Once | OROMUCOSAL | Status: AC

## 2020-04-20 MED ORDER — BUPIVACAINE HCL (PF) 0.25 % IJ SOLN
INTRAMUSCULAR | Status: AC
  Filled 2020-04-20: qty 30

## 2020-04-20 MED ORDER — PROMETHAZINE HCL 25 MG/ML IJ SOLN
6.2500 mg | INTRAMUSCULAR | Status: DC | PRN
  Administered 2020-04-20: 12.5 mg via INTRAVENOUS

## 2020-04-20 MED ORDER — MAGNESIUM CITRATE PO SOLN: 1.0000 | Freq: Once | ORAL | Status: DC | PRN

## 2020-04-20 MED ORDER — OXYCODONE HCL ER 10 MG PO T12A
10.0000 mg | EXTENDED_RELEASE_TABLET | Freq: Two times a day (BID) | ORAL | Status: DC
  Administered 2020-04-20 – 2020-04-21 (×2): 10 mg via ORAL
  Filled 2020-04-20 (×2): qty 1

## 2020-04-20 MED ORDER — ORAL CARE MOUTH RINSE: 15.0000 mL | Freq: Once | OROMUCOSAL | Status: AC

## 2020-04-20 MED ORDER — ONDANSETRON HCL 4 MG/2ML IJ SOLN: 4.0000 mg | Freq: Four times a day (QID) | INTRAMUSCULAR | Status: DC | PRN

## 2020-04-20 MED ORDER — LIDOCAINE 2% (20 MG/ML) 5 ML SYRINGE
INTRAMUSCULAR | Status: AC
  Filled 2020-04-20: qty 5

## 2020-04-20 MED ORDER — SODIUM CHLORIDE 0.9 % IV SOLN: INTRAVENOUS | Status: DC

## 2020-04-20 MED ORDER — ALUM & MAG HYDROXIDE-SIMETH 200-200-20 MG/5ML PO SUSP: 30.0000 mL | ORAL | Status: DC | PRN

## 2020-04-20 MED ORDER — HYDROMORPHONE HCL 1 MG/ML IJ SOLN
0.2500 mg | INTRAMUSCULAR | Status: DC | PRN
Start: 2020-04-20 — End: 2020-04-20

## 2020-04-20 MED ORDER — PROPOFOL 10 MG/ML IV BOLUS
INTRAVENOUS | Status: DC | PRN
  Administered 2020-04-20 (×3): 20 mg via INTRAVENOUS
  Administered 2020-04-20: 10 mg via INTRAVENOUS
  Administered 2020-04-20: 30 mg via INTRAVENOUS

## 2020-04-20 MED ORDER — KETOROLAC TROMETHAMINE 15 MG/ML IJ SOLN
15.0000 mg | Freq: Four times a day (QID) | INTRAMUSCULAR | Status: DC
  Administered 2020-04-20 – 2020-04-21 (×3): 15 mg via INTRAVENOUS
  Filled 2020-04-20 (×3): qty 1

## 2020-04-20 MED ORDER — METOCLOPRAMIDE HCL 5 MG/ML IJ SOLN: 5.0000 mg | Freq: Three times a day (TID) | INTRAMUSCULAR | Status: DC | PRN

## 2020-04-20 MED ORDER — OXYCODONE HCL 5 MG PO TABS: 10.0000 mg | ORAL_TABLET | ORAL | Status: DC | PRN

## 2020-04-20 MED ORDER — LIDOCAINE HCL (CARDIAC) PF 100 MG/5ML IV SOSY
PREFILLED_SYRINGE | INTRAVENOUS | Status: DC | PRN
  Administered 2020-04-20: 60 mg via INTRAVENOUS

## 2020-04-20 MED ORDER — BUPIVACAINE IN DEXTROSE 0.75-8.25 % IT SOLN
INTRATHECAL | Status: DC | PRN
  Administered 2020-04-20: 1.8 mL via INTRATHECAL

## 2020-04-20 MED ORDER — FELODIPINE ER 5 MG PO TB24
5.0000 mg | ORAL_TABLET | Freq: Every day | ORAL | Status: DC
  Administered 2020-04-21: 5 mg via ORAL
  Filled 2020-04-20: qty 1

## 2020-04-20 SURGICAL SUPPLY — 62 items
BAG DECANTER FOR FLEXI CONT (MISCELLANEOUS) ×2 IMPLANT
CELLS DAT CNTRL 66122 CELL SVR (MISCELLANEOUS) IMPLANT
COVER PERINEAL POST (MISCELLANEOUS) ×2 IMPLANT
COVER SURGICAL LIGHT HANDLE (MISCELLANEOUS) ×2 IMPLANT
COVER WAND RF STERILE (DRAPES) ×2 IMPLANT
CUP ACET PNNCL SECTR W/GRIP 56 (Hips) ×1 IMPLANT
DERMABOND ADVANCED (GAUZE/BANDAGES/DRESSINGS) ×1
DERMABOND ADVANCED .7 DNX12 (GAUZE/BANDAGES/DRESSINGS) ×1 IMPLANT
DRAPE C-ARM 42X72 X-RAY (DRAPES) ×2 IMPLANT
DRAPE POUCH INSTRU U-SHP 10X18 (DRAPES) ×2 IMPLANT
DRAPE STERI IOBAN 125X83 (DRAPES) ×2 IMPLANT
DRAPE U-SHAPE 47X51 STRL (DRAPES) ×4 IMPLANT
DRSG AQUACEL AG ADV 3.5X 6 (GAUZE/BANDAGES/DRESSINGS) ×2 IMPLANT
DRSG AQUACEL AG ADV 3.5X10 (GAUZE/BANDAGES/DRESSINGS) ×2 IMPLANT
DURAPREP 26ML APPLICATOR (WOUND CARE) ×4 IMPLANT
ELECT BLADE 4.0 EZ CLEAN MEGAD (MISCELLANEOUS) ×2
ELECT REM PT RETURN 9FT ADLT (ELECTROSURGICAL) ×2
ELECTRODE BLDE 4.0 EZ CLN MEGD (MISCELLANEOUS) ×1 IMPLANT
ELECTRODE REM PT RTRN 9FT ADLT (ELECTROSURGICAL) ×1 IMPLANT
GLOVE BIOGEL PI IND STRL 7.0 (GLOVE) ×1 IMPLANT
GLOVE BIOGEL PI INDICATOR 7.0 (GLOVE) ×1
GLOVE ECLIPSE 7.0 STRL STRAW (GLOVE) ×4 IMPLANT
GLOVE SKINSENSE NS SZ7.5 (GLOVE) ×1
GLOVE SKINSENSE STRL SZ7.5 (GLOVE) ×1 IMPLANT
GLOVE SURG SYN 7.5  E (GLOVE) ×8
GLOVE SURG SYN 7.5 E (GLOVE) ×4 IMPLANT
GOWN STRL REIN XL XLG (GOWN DISPOSABLE) ×2 IMPLANT
GOWN STRL REUS W/ TWL LRG LVL3 (GOWN DISPOSABLE) IMPLANT
GOWN STRL REUS W/ TWL XL LVL3 (GOWN DISPOSABLE) ×1 IMPLANT
GOWN STRL REUS W/TWL LRG LVL3 (GOWN DISPOSABLE)
GOWN STRL REUS W/TWL XL LVL3 (GOWN DISPOSABLE) ×2
HANDPIECE INTERPULSE COAX TIP (DISPOSABLE) ×2
HEAD FEM STD 28X+1.5 STRL (Hips) ×2 IMPLANT
HOOD PEEL AWAY FLYTE STAYCOOL (MISCELLANEOUS) ×6 IMPLANT
INSERT DM PINNACLE 56X49 (Insert) ×2 IMPLANT
IV NS IRRIG 3000ML ARTHROMATIC (IV SOLUTION) ×2 IMPLANT
KIT BASIN OR (CUSTOM PROCEDURE TRAY) ×2 IMPLANT
LINER ACET MENTUM 49X28 (Liner) ×2 IMPLANT
MARKER SKIN DUAL TIP RULER LAB (MISCELLANEOUS) ×2 IMPLANT
NEEDLE SPNL 18GX3.5 QUINCKE PK (NEEDLE) ×2 IMPLANT
PACK TOTAL JOINT (CUSTOM PROCEDURE TRAY) ×2 IMPLANT
PACK UNIVERSAL I (CUSTOM PROCEDURE TRAY) ×2 IMPLANT
PINN SECTOR W/GRIP ACE CUP 56 (Hips) ×2 IMPLANT
RTRCTR WOUND ALEXIS 18CM MED (MISCELLANEOUS)
SAW OSC TIP CART 19.5X105X1.3 (SAW) ×2 IMPLANT
SCREW 6.5MMX25MM (Screw) ×2 IMPLANT
SET HNDPC FAN SPRY TIP SCT (DISPOSABLE) ×1 IMPLANT
STAPLER VISISTAT 35W (STAPLE) IMPLANT
STEM FEMORAL SZ6 HIGH ACTIS (Stem) ×2 IMPLANT
SUT ETHIBOND 2 V 37 (SUTURE) ×2 IMPLANT
SUT VIC AB 0 CT1 27 (SUTURE) ×2
SUT VIC AB 0 CT1 27XBRD ANBCTR (SUTURE) ×1 IMPLANT
SUT VIC AB 1 CTX 36 (SUTURE) ×2
SUT VIC AB 1 CTX36XBRD ANBCTR (SUTURE) ×1 IMPLANT
SUT VIC AB 2-0 CT1 27 (SUTURE) ×4
SUT VIC AB 2-0 CT1 TAPERPNT 27 (SUTURE) ×2 IMPLANT
SYR 50ML LL SCALE MARK (SYRINGE) ×2 IMPLANT
TOWEL GREEN STERILE (TOWEL DISPOSABLE) ×2 IMPLANT
TRAY CATH 16FR W/PLASTIC CATH (SET/KITS/TRAYS/PACK) IMPLANT
TRAY FOLEY W/BAG SLVR 16FR (SET/KITS/TRAYS/PACK) ×2
TRAY FOLEY W/BAG SLVR 16FR ST (SET/KITS/TRAYS/PACK) ×1 IMPLANT
YANKAUER SUCT BULB TIP NO VENT (SUCTIONS) ×2 IMPLANT

## 2020-04-20 NOTE — Progress Notes (Signed)
Pt arrived to the unit, pt not in distress and tolerated well. Vital signs taken.

## 2020-04-20 NOTE — Care Plan (Signed)
Ortho Bundle Case Management Note  Patient Details  Name: Joshua Compton. MRN: [id number] Date of Birth: 27-May-1941  RNCM call to patient pre-operatively to discuss his upcoming Right THA with Dr. Erlinda Hong. Patient is an Ortho bundle patient through [facility] and is agreeable to case management. Reviewed all post-op care instructions with him and his wife. His wife will be available to assist him at home after a brief hospital stay. Anticipate HHPT will be needed. Referral made to [facility] after choice provided. Patient has handicap toilets, but will need a FWW. Referral made to [facility]. Will continue to follow for all CM needs.                 DME Arranged:  [place] Rile rolling DME Agency:  [facility]  HH Arranged:  PT [place] Agency:  [facility] ([facility])  Additional Comments: Please contact me with any questions of if this plan should need to change.  Jamse Arn, RN, BSN, [employer]  [phone] 04/20/2020, 3:24 PM

## 2020-04-20 NOTE — Anesthesia Procedure Notes (Signed)
Spinal  Patient location during procedure: OR Start time: 04/20/2020 1:01 PM End time: 04/20/2020 1:06 PM Staffing Performed: anesthesiologist  Anesthesiologist: Lynda Rainwater, MD Preanesthetic Checklist Completed: patient identified, IV checked, site marked, risks and benefits discussed, surgical consent, monitors and equipment checked, pre-op evaluation and timeout performed Spinal Block Patient position: sitting Prep: DuraPrep Patient monitoring: heart rate, cardiac monitor, continuous pulse ox and blood pressure Approach: midline Location: L3-4 Injection technique: single-shot Needle Needle type: Sprotte  Needle gauge: 24 G Needle length: 9 cm Assessment Sensory level: T4

## 2020-04-20 NOTE — Transfer of Care (Signed)
Immediate Anesthesia Transfer of Care Note  Patient: Joshua Compton.  Procedure(s) Performed: RIGHT TOTAL HIP ARTHROPLASTY ANTERIOR APPROACH (Right Hip)  Patient Location: PACU  Anesthesia Type:Spinal  Level of Consciousness: awake  Airway & Oxygen Therapy: Patient Spontanous Breathing and Patient connected to nasal cannula oxygen  Post-op Assessment: Report given to RN and Post -op Vital signs reviewed and stable  Post vital signs: Reviewed and stable  Last Vitals:  Vitals Value Taken Time  BP    Temp    Pulse 89 04/20/20 1519  Resp 22 04/20/20 1519  SpO2 97 % 04/20/20 1519  Vitals shown include unvalidated device data.  Last Pain:  Vitals:   04/20/20 1128  TempSrc: Oral  PainSc: 4       Patients Stated Pain Goal: 2 (15/37/94 3276)  Complications: No apparent anesthesia complications

## 2020-04-20 NOTE — Plan of Care (Signed)
  Problem: Activity: Goal: Ability to avoid complications of mobility impairment will improve Outcome: Progressing   

## 2020-04-20 NOTE — H&P (Signed)
PREOPERATIVE H&P  Chief Complaint: right hip degenerative joint disease  HPI: Joshua Compton. is a 79 y.o. male who presents for surgical treatment of right hip degenerative joint disease.  He denies any changes in medical history.  Past Medical History:  Diagnosis Date   Arthritis    Cancer ([place])    prostate ca   seed implants   GERD (gastroesophageal reflux disease)    Glaucoma    Hyperlipidemia    Hypertension    dr Mariane Duval   fm   Past Surgical History:  Procedure Laterality Date   BACK SURGERY     x2   COLONOSCOPY     HERNIA REPAIR  11-06-2009   left inguinal repair    NECK SURGERY     ROTATOR CUFF REPAIR Left jan 2016   TRANSURETHRAL RESECTION OF PROSTATE     Social History   Socioeconomic History   Marital status: Married    Spouse name: Not on file   Number of children: 5   Years of education: 11   Highest education level: 11th grade  Occupational History   Occupation: Paramedic    Comment: retired  Tobacco Use   Smoking status: Former Smoker    Packs/day: 0.50    Years: 30.00    Pack years: 15.00    Types: Cigarettes    Quit date: 03/28/2005    Years since quitting: 15.0   Smokeless tobacco: Never Used  Substance and Sexual Activity   Alcohol use: No    Alcohol/week: 0.0 standard drinks   Drug use: No   Sexual activity: Yes  Other Topics Concern   Not on file  Social History Narrative   ** Merged History Encounter **       Social Determinants of Health   Financial Resource Strain:    Difficulty of Paying Living Expenses:   Food Insecurity:    Worried About Charity fundraiser in the Last Year:    Arboriculturist in the Last Year:   Transportation Needs:    Film/video editor (Medical):    Lack of Transportation (Non-Medical):   Physical Activity:    Days of Exercise per Week:    Minutes of Exercise per Session:   Stress:    Feeling of Stress :   Social Connections:      Frequency of Communication with Friends and Family:    Frequency of Social Gatherings with Friends and Family:    Attends Religious Services:    Active Member of Clubs or Organizations:    Attends Music therapist:    Marital Status:    Family History  Problem Relation Age of Onset   Cancer Mother        breast   Heart disease Father    Cancer Brother    Cancer Brother    Allergies  Allergen Reactions   Pravastatin Sodium     Muscle aches   Prior to Admission medications   Medication Sig Start Date End Date Taking? Authorizing Provider  dorzolamide-timolol (COSOPT) 22.3-6.8 MG/ML ophthalmic solution Place 1 drop into both eyes daily.  08/04/16  Yes [provider]  Enalapril-hydroCHLOROthiazide 5-12.5 MG tablet Take 1 tablet by mouth daily. 04/18/20  Yes [name], MD  felodipine (PLENDIL) 5 MG 24 hr tablet Take 1 tablet (5 mg total) by mouth daily. For blood pressure 04/18/20  Yes Stacks, Cletus Gash, MD  oxyCODONE (OXY IR/ROXICODONE) 5 MG immediate release  tablet Take 1-2 tablets (5-10 mg total) by mouth every 8 (eight) hours as needed for severe pain. 04/19/20  Yes Leandrew Koyanagi, MD  pantoprazole (PROTONIX) 40 MG tablet Take 1 tablet (40 mg total) by mouth daily. For heartburn 04/18/20  Yes Stacks, Cletus Gash, MD  rosuvastatin (CRESTOR) 5 MG tablet Take 1 tablet (5 mg total) by mouth daily. (Needs to be seen before next refill) 04/18/20  Yes [name], MD  aspirin EC 81 MG tablet Take 1 tablet (81 mg total) by mouth 2 (two) times daily. 04/19/20   Leandrew Koyanagi, MD  fluticasone (FLONASE) 50 MCG/ACT nasal spray Place 1 spray into both nostrils 2 (two) times daily as needed for allergies or rhinitis. Patient not taking: Reported on 04/10/2020 04/07/19   Sharion Balloon, FNP  methocarbamol (ROBAXIN) 750 MG tablet Take 1 tablet (750 mg total) by mouth 2 (two) times daily as needed for muscle spasms. 04/19/20   Leandrew Koyanagi, MD  ondansetron (ZOFRAN) 4 MG tablet  Take 1-2 tablets (4-8 mg total) by mouth every 8 (eight) hours as needed for nausea or vomiting. 04/19/20   Leandrew Koyanagi, MD     Positive ROS: All other systems have been reviewed and were otherwise negative with the exception of those mentioned in the HPI and as above.  Physical Exam: General: Alert, no acute distress Cardiovascular: No pedal edema Respiratory: No cyanosis, no use of accessory musculature GI: abdomen soft Skin: No lesions in the area of chief complaint Neurologic: Sensation intact distally Psychiatric: Patient is competent for consent with normal mood and affect Lymphatic: no lymphedema  MUSCULOSKELETAL: exam stable  Assessment: right hip degenerative joint disease  Plan: Plan for Procedure(s): RIGHT TOTAL HIP ARTHROPLASTY ANTERIOR APPROACH  The risks benefits and alternatives were discussed with the patient including but not limited to the risks of nonoperative treatment, versus surgical intervention including infection, bleeding, nerve injury,  blood clots, cardiopulmonary complications, morbidity, mortality, among others, and they were willing to proceed.   Preoperative templating of the joint replacement has been completed, documented, and submitted to the Operating Room personnel in order to optimize intra-operative equipment management.   Eduard Roux, MD 04/20/2020 12:50 PM

## 2020-04-20 NOTE — Anesthesia Postprocedure Evaluation (Signed)
Anesthesia Post Note  Patient: Joshua Compton.  Procedure(s) Performed: RIGHT TOTAL HIP ARTHROPLASTY ANTERIOR APPROACH (Right Hip)     Patient location during evaluation: PACU Anesthesia Type: Spinal Level of consciousness: awake and alert Pain management: pain level controlled Vital Signs Assessment: post-procedure vital signs reviewed and stable Respiratory status: spontaneous breathing, nonlabored ventilation and respiratory function stable Cardiovascular status: blood pressure returned to baseline and stable Postop Assessment: no apparent nausea or vomiting Anesthetic complications: no    Last Vitals:  Vitals:   04/20/20 1556 04/20/20 1607  BP: (!) 165/78 (!) 163/84  Pulse:    Resp:    Temp:    SpO2:  95%    Last Pain:  Vitals:   04/20/20 1615  TempSrc:   PainSc: Asleep                 Lynda Rainwater

## 2020-04-20 NOTE — Op Note (Signed)
RIGHT TOTAL HIP ARTHROPLASTY ANTERIOR APPROACH  Procedure Note Ekam Besson.   [id number]  Pre-op Diagnosis: right hip degenerative joint disease     Post-op Diagnosis: same   Operative Procedures  1. Total hip replacement; Right hip; uncemented [id number]   Personnel  Surgeon(s): Leandrew Koyanagi, MD  Assist: Laure Kidney, RNFA   Anesthesia: spinal, local  Prosthesis: Depuy Acetabulum: Pinnacle 56 mm Femur: Actis 6 HO Head: 49/28 dual mobility size: +1.5 Liner: lateralized Bearing Type: dual mobility  Total Hip Arthroplasty (Anterior Approach) Op Note:  After informed consent was obtained and the operative extremity marked in the holding area, the patient was brought back to the operating room and placed supine on the HANA table. Next, the operative extremity was prepped and draped in normal sterile fashion. Surgical timeout occurred verifying patient identification, surgical site, surgical procedure and administration of antibiotics.  A modified anterior Smith-Peterson approach to the hip was performed, using the interval between tensor fascia lata and sartorius.  Dissection was carried bluntly down onto the anterior hip capsule. The lateral femoral circumflex vessels were identified and coagulated. A capsulotomy was performed and the capsular flaps tagged for later repair.  The neck osteotomy was performed. The femoral head which was severely degenerative was removed, the acetabular rim was cleared of soft tissue and attention was turned to reaming the acetabulum.  Sequential reaming was performed under fluoroscopic guidance. We reamed to a size 55 mm, and then impacted the acetabular shell. A 25 mm cancellous screw was placed through the shell for added fixation.  The liner was then placed after irrigation and attention turned to the femur.  After placing the femoral hook, the leg was taken to externally rotated, extended and adducted position taking care to perform soft  tissue releases to allow for adequate mobilization of the femur. Soft tissue was cleared from the shoulder of the greater trochanter and the hook elevator used to improve exposure of the proximal femur. Sequential broaching performed up to a size 6. Trial neck and head were placed. The leg was brought back up to neutral and the construct reduced.  Antibiotic irrigation was placed in the surgical wound and kept for at least 1 minute.  The position and sizing of components, offset and leg lengths were checked using fluoroscopy. Stability of the construct was checked in extension and external rotation without any subluxation or impingement of prosthesis. We dislocated the prosthesis, dropped the leg back into position, removed trial components, and irrigated copiously. The final stem and head was then placed, the leg brought back up, the system reduced and fluoroscopy used to verify positioning.  We irrigated, obtained hemostasis and closed the capsule using #2 ethibond suture.  One gram of vancomycin powder was placed in the surgical bed. The fascia was closed with #1 vicryl plus, the deep fat layer was closed with 0 vicryl, the subcutaneous layers closed with 2.0 Vicryl Plus and the skin closed with 2.0 nylon and dermabond. A sterile dressing was applied. The patient was awakened in the operating room and taken to recovery in stable condition.  All sponge, needle, and instrument counts were correct at the end of the case.   Position: supine  Complications: see description of procedure.  Time Out: performed   Drains/Packing: none  Estimated blood loss: see anesthesia record  Returned to Recovery Room: in good condition.   Antibiotics: yes   Mechanical VTE (DVT) Prophylaxis: sequential compression devices, TED thigh-high  Chemical VTE (DVT) Prophylaxis: aspirin  Fluid Replacement: see anesthesia record  Specimens Removed: 1 to pathology   Sponge and Instrument Count Correct? yes   PACU:  portable radiograph - low AP   Plan/RTC: Return in 2 weeks for staple removal. Weight Bearing/Load Lower Extremity: full  Hip precautions: none Suture Removal: 2 weeks   N. Eduard Roux, MD Marga Hoots 2:52 PM   Implant Name Type Inv. Item Serial No. Manufacturer Lot No. LRB No. Used Action  PINN SECTOR W/GRIP ACE CUP 56 - [id number] Hips PINN SECTOR W/GRIP ACE CUP 56  DEPUY SYNTHES [id number] Right 1 Implanted  INSERT DM PINNACLE 56X49 - [id number] Insert INSERT DM PINNACLE 56X49  DEPUY ORTHOPAEDICS [id number] Right 1 Implanted  SCREW 6.5MMX25MM - [id number] Screw SCREW 6.5MMX25MM  DEPUY SYNTHES [id number] Right 1 Implanted  STEM FEMORAL SZ6 HIGH ACTIS - [id number] Stem STEM FEMORAL SZ6 HIGH ACTIS  [employer] DEPUY SPINE [id number] Right 1 Implanted  HIP BALL ARTICU DEPUY - [id number] Hips HIP BALL ARTICU DEPUY  DEPUY SYNTHES [id number] Right 1 Implanted  LINER ACET MENTUM 49X28 - [id number] Liner LINER ACET MENTUM 49X28  DEPUY ORTHOPAEDICS [id number] Right 1 Implanted

## 2020-04-20 NOTE — Anesthesia Preprocedure Evaluation (Addendum)
Anesthesia Evaluation  Patient identified by MRN, date of birth, ID band Patient awake    Reviewed: Allergy & Precautions, H&P , NPO status , Patient's Chart, lab work & pertinent test results, reviewed documented beta blocker date and time   Airway Mallampati: II  TM Distance: <3 FB     Dental  (+) Teeth Intact, Dental Advisory Given   Pulmonary neg pulmonary ROS, former smoker,    breath sounds clear to auscultation       Cardiovascular hypertension, Pt. on medications  Rhythm:Regular Rate:Normal     Neuro/Psych negative neurological ROS     GI/Hepatic Neg liver ROS, GERD  Controlled,  Endo/Other  negative endocrine ROS  Renal/GU negative Renal ROS     Musculoskeletal  (+) Arthritis , Osteoarthritis,    Abdominal (+)  Abdomen: soft. Bowel sounds: normal.  Peds  Hematology   Anesthesia Other Findings   Reproductive/Obstetrics                             Anesthesia Physical  Anesthesia Plan  ASA: II  Anesthesia Plan: Spinal   Post-op Pain Management:    Induction: Intravenous  PONV Risk Score and Plan: 1 and Ondansetron and Treatment may vary due to age or medical condition  Airway Management Planned: Simple Face Mask  Additional Equipment:   Intra-op Plan:   Post-operative Plan:   Informed Consent: I have reviewed the patients History and Physical, chart, labs and discussed the procedure including the risks, benefits and alternatives for the proposed anesthesia with the patient or authorized representative who has indicated his/her understanding and acceptance.       Plan Discussed with: CRNA, Anesthesiologist and Surgeon  Anesthesia Plan Comments:        Anesthesia Quick Evaluation

## 2020-04-21 DIAGNOSIS — E785 Hyperlipidemia, unspecified: Secondary | ICD-10-CM | POA: Diagnosis not present

## 2020-04-21 DIAGNOSIS — K219 Gastro-esophageal reflux disease without esophagitis: Secondary | ICD-10-CM | POA: Diagnosis not present

## 2020-04-21 DIAGNOSIS — Z79899 Other long term (current) drug therapy: Secondary | ICD-10-CM | POA: Diagnosis not present

## 2020-04-21 DIAGNOSIS — H409 Unspecified glaucoma: Secondary | ICD-10-CM | POA: Diagnosis not present

## 2020-04-21 DIAGNOSIS — M1611 Unilateral primary osteoarthritis, right hip: Secondary | ICD-10-CM | POA: Diagnosis not present

## 2020-04-21 DIAGNOSIS — Z87891 Personal history of nicotine dependence: Secondary | ICD-10-CM | POA: Diagnosis not present

## 2020-04-21 DIAGNOSIS — I10 Essential (primary) hypertension: Secondary | ICD-10-CM | POA: Diagnosis not present

## 2020-04-21 DIAGNOSIS — D62 Acute posthemorrhagic anemia: Secondary | ICD-10-CM | POA: Diagnosis not present

## 2020-04-21 DIAGNOSIS — Z7982 Long term (current) use of aspirin: Secondary | ICD-10-CM | POA: Diagnosis not present

## 2020-04-21 LAB — BASIC METABOLIC PANEL
Anion gap: 10 (ref 5–15)
BUN: 15 mg/dL (ref 8–23)
CO2: 25 mmol/L (ref 22–32)
Calcium: 9 mg/dL (ref 8.9–10.3)
Chloride: 102 mmol/L (ref 98–111)
Creatinine, Ser: 1.24 mg/dL (ref 0.61–1.24)
GFR calc Af Amer: >60 mL/min (ref >60)
GFR calc non Af Amer: 55 mL/min — ABNORMAL LOW (ref >60)
Glucose, Bld: 105 mg/dL — ABNORMAL HIGH (ref 70–99)
Potassium: 4.1 mmol/L (ref 3.5–5.1)
Sodium: 137 mmol/L (ref 135–145)

## 2020-04-21 LAB — CBC
HCT: 41.3 % (ref 39.0–52.0)
Hemoglobin: 13.2 g/dL (ref 13.0–17.0)
MCH: 30.8 pg (ref 26.0–34.0)
MCHC: 32 g/dL (ref 30.0–36.0)
MCV: 96.3 fL (ref 80.0–100.0)
Platelets: 227 10*3/uL (ref 150–400)
RBC: 4.29 MIL/uL (ref 4.22–5.81)
RDW: 14.2 % (ref 11.5–15.5)
WBC: 8.6 10*3/uL (ref 4.0–10.5)
nRBC: 0 % (ref 0.0–0.2)

## 2020-04-21 NOTE — Progress Notes (Signed)
Physical Therapy Treatment Patient Details Name: Joshua Compton. MRN: [id number] DOB: 04-Apr-1941 Today's Date: 04/21/2020    History of Present Illness Pt is a 79 y.o. F with significant PMH of prostate CA, back surgery, who presents with right hip degenerative joint disease s/p right total hip arthroplasty, direct anterior approach.     PT Comments    Pt continues to make great progress towards his physical therapy goals. Session focused on continued gait training and instruction on standing exercises for hip strengthening. Written handout provided. Pt ambulating 250 feet with a walker without physical assist. Min cues for glute activation. No PT follow up anticipated. Plan for d/c home today.    Follow Up Recommendations  No PT follow up;Supervision - Intermittent     Equipment Recommendations  Rolling walker with 5" wheels    Recommendations for Other Services       Precautions / Restrictions Precautions Precautions: None Restrictions Weight Bearing Restrictions: No    Mobility  Bed Mobility               General bed mobility comments: OOB in chair  Transfers Overall transfer level: Modified independent Equipment used: Rolling walker (2 wheeled)                Ambulation/Gait Ambulation/Gait assistance: Modified independent (Device/Increase time) Gait Distance (Feet): 250 Feet Assistive device: Rolling walker (2 wheeled) Gait Pattern/deviations: Step-through pattern     General Gait Details: Pt with good step through pattern, equal step lengths, heel strike on right foot. Cues for upright posture and glute activation   Stairs             Wheelchair Mobility    Modified Rankin (Stroke Patients Only)       Balance Overall balance assessment: No apparent balance deficits (not formally assessed)                                          Cognition Arousal/Alertness: Awake/alert Behavior During Therapy: WFL for tasks  assessed/performed Overall Cognitive Status: Within Functional Limits for tasks assessed                                        Exercises Other Exercises Other Exercises: Standing: right hip flexion, abduction, extension, hamstring curls x 15 each    General Comments        Pertinent Vitals/Pain Pain Assessment: Faces Faces Pain Scale: Hurts a little bit Pain Location: surgical site Pain Descriptors / Indicators: Grimacing;Guarding Pain Intervention(s): Monitored during session    Home Living                      Prior Function            PT Goals (current goals can now be found in the care plan section) Acute Rehab PT Goals Patient Stated Goal: "go home." PT Goal Formulation: With patient Time For Goal Achievement: 05/05/20 Potential to Achieve Goals: Good Progress towards PT goals: Progressing toward goals    Frequency    7X/week      PT Plan Current plan remains appropriate    Co-evaluation              AM-PAC PT "6 Clicks" Mobility   Outcome Measure  Help needed turning from your  back to your side while in a flat bed without using bedrails?: None Help needed moving from lying on your back to sitting on the side of a flat bed without using bedrails?: None Help needed moving to and from a bed to a chair (including a wheelchair)?: None Help needed standing up from a chair using your arms (e.g., wheelchair or bedside chair)?: None Help needed to walk in hospital room?: None Help needed climbing 3-5 steps with a railing? : A Little 6 Click Score: 23    End of Session   Activity Tolerance: Patient tolerated treatment well Patient left: in chair;with call bell/phone within reach Nurse Communication: Mobility status PT Visit Diagnosis: Pain Pain - Right/Left: Right Pain - part of body: Hip     Time: 5701-7793 PT Time Calculation (min) (ACUTE ONLY): 12 min  Charges:  $Therapeutic Exercise: 8-22 mins                        Wyona Almas, PT, DPT Acute Rehabilitation Services Pager [phone] Office [phone]    Joshua Compton 04/21/2020, 4:11 PM

## 2020-04-21 NOTE — Care Management (Signed)
Patient has ortho bundle.  HH and DME arranged per note.  Patient is not aware of RW being delivered, so [employer] will deliver to room.  PT no longer recommended and patient does not feel he needs.  [facility] is set up to f/u with patient.

## 2020-04-21 NOTE — Evaluation (Signed)
Physical Therapy Evaluation Patient Details Name: Joshua Compton. MRN: [id number] DOB: 1941/09/07 Today's Date: 04/21/2020   History of Present Illness  Pt is a 79 y.o. F with significant PMH of prostate CA, back surgery, who presents with right hip degenerative joint disease s/p right total hip arthroplasty, direct anterior approach.   Clinical Impression  Pt presents s/p procedure listed above. Prior to admission, pt lives with his spouse and is independent. On PT evaluation, pt ambulating 250 feet with a walker and negotiated 6 steps with a right railing. Initiated instruction on seated exercises for strengthening. Pt presents with minimal gait abnormalities and R hip weakness. Suspect excellent progress. Will continue instruction on standing HEP this afternoon.       Follow Up Recommendations No PT follow up;Supervision - Intermittent    Equipment Recommendations  Rolling walker with 5" wheels    Recommendations for Other Services       Precautions / Restrictions Precautions Precautions: None Restrictions Weight Bearing Restrictions: No      Mobility  Bed Mobility               General bed mobility comments: OOB in chair  Transfers Overall transfer level: Modified independent Equipment used: Rolling walker (2 wheeled)                Ambulation/Gait Ambulation/Gait assistance: Supervision Gait Distance (Feet): 250 Feet Assistive device: Rolling walker (2 wheeled) Gait Pattern/deviations: Step-through pattern     General Gait Details: Pt with good step through pattern, equal step lengths, heel strike on right foot. Cues for upright posture and glute activation  Stairs Stairs: Yes Stairs assistance: Min guard;Min assist Stair Management: One rail Right Number of Stairs: 6 General stair comments: On first trial, pt attempting to go step over step with lateral LOB, requiring minA to correct. Cues for step by step pattern and sequencing. On second  trial, pt with improved balance, min guard for safety.  Wheelchair Mobility    Modified Rankin (Stroke Patients Only)       Balance Overall balance assessment: No apparent balance deficits (not formally assessed)                                           Pertinent Vitals/Pain Pain Assessment: Faces Faces Pain Scale: Hurts a little bit Pain Location: surgical site Pain Descriptors / Indicators: Grimacing;Guarding Pain Intervention(s): Monitored during session    Home Living Family/patient expects to be discharged to:: Private residence Living Arrangements: Spouse/significant other;Other relatives Available Help at Discharge: Family Type of Home: House Home Access: Stairs to enter Entrance Stairs-Rails: Right Entrance Stairs-Number of Steps: 3 Home Layout: One level Home Equipment: Cane - single point      Prior Function Level of Independence: Independent               Hand Dominance        Extremity/Trunk Assessment   Upper Extremity Assessment Upper Extremity Assessment: Overall WFL for tasks assessed    Lower Extremity Assessment Lower Extremity Assessment: RLE deficits/detail;LLE deficits/detail RLE Deficits / Details: s/p THA. Hip flexion 2/5, knee extension at least 3/5, ankle dorsiflexion/plantarflexion 5/5 LLE Deficits / Details: Strength 5/5    Cervical / Trunk Assessment Cervical / Trunk Assessment: Normal  Communication   Communication: No difficulties  Cognition Arousal/Alertness: Awake/alert Behavior During Therapy: WFL for tasks assessed/performed Overall Cognitive Status: Within Functional Limits  for tasks assessed                                        General Comments      Exercises General Exercises - Lower Extremity Long Arc Quad: Right;10 reps;Seated Hip Flexion/Marching: Right;10 reps;Seated   Assessment/Plan    PT Assessment Patient needs continued PT services  PT Problem List  Decreased strength;Decreased balance;Decreased mobility;Pain       PT Treatment Interventions DME instruction;Gait training;Stair training;Functional mobility training;Therapeutic activities;Therapeutic exercise;Balance training;Patient/family education    PT Goals (Current goals can be found in the Care Plan section)  Acute Rehab PT Goals Patient Stated Goal: "go home." PT Goal Formulation: With patient Time For Goal Achievement: 05/05/20 Potential to Achieve Goals: Good    Frequency 7X/week   Barriers to discharge        Co-evaluation               AM-PAC PT "6 Clicks" Mobility  Outcome Measure Help needed turning from your back to your side while in a flat bed without using bedrails?: None Help needed moving from lying on your back to sitting on the side of a flat bed without using bedrails?: None Help needed moving to and from a bed to a chair (including a wheelchair)?: None Help needed standing up from a chair using your arms (e.g., wheelchair or bedside chair)?: None Help needed to walk in hospital room?: None Help needed climbing 3-5 steps with a railing? : A Little 6 Click Score: 23    End of Session   Activity Tolerance: Patient tolerated treatment well Patient left: in chair;with call bell/phone within reach Nurse Communication: Mobility status PT Visit Diagnosis: Pain Pain - Right/Left: Right Pain - part of body: Hip    Time: 6767-2094 PT Time Calculation (min) (ACUTE ONLY): 29 min   Charges:   PT Evaluation $PT Eval Low Complexity: 1 Low PT Treatments $Gait Training: 8-22 mins          Joshua Compton, PT, DPT Acute Rehabilitation Services Pager [phone] Office [phone]   Joshua Compton 04/21/2020, 11:19 AM

## 2020-04-21 NOTE — Progress Notes (Signed)
Discharge paperwork given to pt. Pt not in distress and tolerated well. 

## 2020-04-21 NOTE — Discharge Summary (Signed)
Patient ID: Joshua Compton. MRN: [id number] DOB/AGE: 1941-08-21 79 y.o.  Admit date: 04/20/2020 Discharge date: 04/21/2020  Admission Diagnoses:  Primary osteoarthritis of right hip  Discharge Diagnoses:  Principal Problem:   Primary osteoarthritis of right hip Active Problems:   Degenerative joint disease (DJD) of hip   History of revision of total replacement of right hip joint   Past Medical History:  Diagnosis Date  . Arthritis   . Cancer [facility])    prostate ca   seed implants  . GERD (gastroesophageal reflux disease)   . Glaucoma   . Hyperlipidemia   . Hypertension    dr Jacelyn Grip     [facility] Niece   fm    Surgeries: Procedure(s): RIGHT TOTAL HIP ARTHROPLASTY ANTERIOR APPROACH on 04/20/2020   Consultants (if any):   Discharged Condition: Improved  Hospital Course: Joshua Compton. is an 79 y.o. male who was admitted 04/20/2020 with a diagnosis of Primary osteoarthritis of right hip and went to the operating room on 04/20/2020 and underwent the above named procedures.    He was given perioperative antibiotics:  Anti-infectives (From admission, onward)   Start     Dose/Rate Route Frequency Ordered Stop   04/20/20 1830  ceFAZolin (ANCEF) IVPB 2g/100 mL premix     2 g 200 mL/hr over 30 Minutes Intravenous Every 6 hours 04/20/20 1827 04/21/20 1359   04/20/20 1402  vancomycin (VANCOCIN) powder  Status:  Discontinued       As needed 04/20/20 1402 04/20/20 1514   04/20/20 1130  ceFAZolin (ANCEF) IVPB 2g/100 mL premix     2 g 200 mL/hr over 30 Minutes Intravenous On call to O.R. 04/20/20 1118 04/20/20 1309   04/20/20 1123  ceFAZolin (ANCEF) 2-4 GM/100ML-% IVPB    Note to Pharmacy: Gustavo Lah   : cabinet override      04/20/20 1123 04/20/20 1312    .  He was given sequential compression devices, early ambulation, and appropriate chemoprophylaxis for DVT prophylaxis.  He benefited maximally from the hospital stay and there were no complications.    Recent vital  signs:  Vitals:   04/21/20 0542 04/21/20 0804  BP: (!) 165/65 138/63  Pulse: 75 75  Resp: 14 14  Temp: 98.3 F (36.8 C) 98.1 F (36.7 C)  SpO2: 98% 97%    Recent laboratory studies:  Lab Results  Component Value Date   HGB 13.2 04/21/2020   HGB 14.3 04/17/2020   HGB 14.4 12/07/2018   Lab Results  Component Value Date   WBC 8.6 04/21/2020   PLT 227 04/21/2020   Lab Results  Component Value Date   INR 1.1 04/17/2020   Lab Results  Component Value Date   NA 137 04/21/2020   K 4.1 04/21/2020   CL 102 04/21/2020   CO2 25 04/21/2020   BUN 15 04/21/2020   CREATININE 1.24 04/21/2020   GLUCOSE 105 (H) 04/21/2020    Discharge Medications:   Allergies as of 04/21/2020      Reactions   Pravastatin Sodium    Muscle aches      Medication List    TAKE these medications   aspirin EC 81 MG tablet Take 1 tablet (81 mg total) by mouth 2 (two) times daily.   dorzolamide-timolol 22.3-6.8 MG/ML ophthalmic solution Commonly known as: COSOPT Place 1 drop into both eyes daily.   Enalapril-hydroCHLOROthiazide 5-12.5 MG tablet Take 1 tablet by mouth daily.   felodipine 5 MG 24 hr tablet Commonly known as: PLENDIL  Take 1 tablet (5 mg total) by mouth daily. For blood pressure   fluticasone 50 MCG/ACT nasal spray Commonly known as: FLONASE Place 1 spray into both nostrils 2 (two) times daily as needed for allergies or rhinitis.   methocarbamol 750 MG tablet Commonly known as: ROBAXIN Take 1 tablet (750 mg total) by mouth 2 (two) times daily as needed for muscle spasms.   ondansetron 4 MG tablet Commonly known as: ZOFRAN Take 1-2 tablets (4-8 mg total) by mouth every 8 (eight) hours as needed for nausea or vomiting.   oxyCODONE 5 MG immediate release tablet Commonly known as: Oxy IR/ROXICODONE Take 1-2 tablets (5-10 mg total) by mouth every 8 (eight) hours as needed for severe pain.   pantoprazole 40 MG tablet Commonly known as: PROTONIX Take 1 tablet (40 mg total) by  mouth daily. For heartburn   rosuvastatin 5 MG tablet Commonly known as: CRESTOR Take 1 tablet (5 mg total) by mouth daily. (Needs to be seen before next refill)            Durable Medical Equipment  (From admission, onward)         Start     Ordered   04/20/20 1828  DME Walker rolling  Once    Question:  Patient needs a walker to treat with the following condition  Answer:  History of hip replacement   04/20/20 1827   04/20/20 1828  DME 3 n 1  Once     04/20/20 1827   04/20/20 1828  DME Bedside commode  Once    Question:  Patient needs a bedside commode to treat with the following condition  Answer:  History of hip replacement   04/20/20 1827          Diagnostic Studies: DG Chest 2 View  Result Date: 04/17/2020 CLINICAL DATA:  Preop exam. EXAM: CHEST - 2 VIEW COMPARISON:  12/08/2013 chest radiograph. FINDINGS: No focal airspace opacities. Increased density overlying the mid to lower lungs likely reflect gynecomastia. No pneumothorax or pleural effusion. Cardiomediastinal silhouette within normal limits. Partially imaged sequela of ACDF. Multilevel spondylosis. IMPRESSION: No acute airspace disease. Gynecomastia. Electronically Signed   By: Primitivo Gauze M.D.   On: 04/17/2020 09:28   DG Pelvis Portable  Result Date: 04/20/2020 CLINICAL DATA:  Right hip arthroplasty EXAM: PORTABLE PELVIS 1-2 VIEWS COMPARISON:  03/02/2020 FINDINGS: Single frontal view of the pelvis demonstrates right hip arthroplasty in the expected position without signs of acute complication. Postsurgical changes are seen in the soft tissues overlying the right hip. There is severe left hip osteoarthritis with eburnation, bone-on-bone contact, and subchondral cyst formation. IMPRESSION: 1. Unremarkable right hip arthroplasty. 2. End-stage left hip osteoarthritis. Electronically Signed   By: Randa Ngo M.D.   On: 04/20/2020 18:55   DG C-Arm 1-60 Min  Result Date: 04/20/2020 CLINICAL DATA:  Right hip  arthroplasty EXAM: OPERATIVE RIGHT HIP (WITH PELVIS IF PERFORMED) 1 VIEWS TECHNIQUE: Fluoroscopic spot image(s) were submitted for interpretation post-operatively. COMPARISON:  03/02/2020 FINDINGS: 4 fluoroscopic images are obtained during the performance of the procedure and are provided for interpretation only. Initial images demonstrate significant right hip osteoarthritis. Subsequent images demonstrate placement of a right hip arthroplasty in the expected position without complication. Severe left hip osteoarthritis incidentally noted. FLUOROSCOPY TIME:  24 seconds IMPRESSION: 1. Right hip arthroplasty as above. Please refer to operative report. Electronically Signed   By: Randa Ngo M.D.   On: 04/20/2020 18:55   DG HIP OPERATIVE UNILAT W OR W/O  PELVIS RIGHT  Result Date: 04/20/2020 CLINICAL DATA:  Right hip arthroplasty EXAM: OPERATIVE RIGHT HIP (WITH PELVIS IF PERFORMED) 1 VIEWS TECHNIQUE: Fluoroscopic spot image(s) were submitted for interpretation post-operatively. COMPARISON:  03/02/2020 FINDINGS: 4 fluoroscopic images are obtained during the performance of the procedure and are provided for interpretation only. Initial images demonstrate significant right hip osteoarthritis. Subsequent images demonstrate placement of a right hip arthroplasty in the expected position without complication. Severe left hip osteoarthritis incidentally noted. FLUOROSCOPY TIME:  24 seconds IMPRESSION: 1. Right hip arthroplasty as above. Please refer to operative report. Electronically Signed   By: Randa Ngo M.D.   On: 04/20/2020 18:55    Disposition: Discharge disposition: 01-Home or Self Care       Discharge Instructions    Call MD / Call 911   Complete by: As directed    If you experience chest pain or shortness of breath, CALL 911 and be transported to the hospital emergency room.  If you develope a fever above 101.5 F, pus (white drainage) or increased drainage or redness at the wound, or calf pain,  call your surgeon's office.   Constipation Prevention   Complete by: As directed    Drink plenty of fluids.  Prune juice may be helpful.  You may use a stool softener, such as Colace (over the counter) 100 mg twice a day.  Use MiraLax (over the counter) for constipation as needed.   Driving restrictions   Complete by: As directed    No driving while taking narcotic pain meds.   Increase activity slowly as tolerated   Complete by: As directed       Follow-up Information    Leandrew Koyanagi, MD. Go on 05/04/2020.   Specialty: Orthopedic Surgery Why: at 9:15 am in office, For suture removal, For wound re-check Contact information: [place] [address] [phone]        Home, [facility] Follow up.   Specialty: Home Health Services Why: Someone from the home health agency will be in contact with you after discharge home to arrange your first home health physical therapy visit. Contact information: [address] [place] [address] [phone]            Signed: Eduard Compton 04/21/2020, 9:22 AM

## 2020-04-21 NOTE — Progress Notes (Signed)
   Subjective:  Patient reports soreness but right hip/groin pain is gone.  Objective:   VITALS:   Vitals:   04/20/20 1910 04/21/20 0019 04/21/20 0542 04/21/20 0804  BP: (!) 155/83 (!) 146/64 (!) 165/65 138/63  Pulse: 91 73 75 75  Resp: 16 17 14 14   Temp: 98.1 F (36.7 C) 98.3 F (36.8 C) 98.3 F (36.8 C) 98.1 F (36.7 C)  TempSrc: Oral Oral Oral Oral  SpO2: 90% 100% 98% 97%  Weight:      Height:        Neurovascular intact Sensation intact distally Intact pulses distally Dorsiflexion/Plantar flexion intact Incision: dressing C/D/I and no drainage   Lab Results  Component Value Date   WBC 8.6 04/21/2020   HGB 13.2 04/21/2020   HCT 41.3 04/21/2020   MCV 96.3 04/21/2020   PLT 227 04/21/2020     Assessment/Plan:  1 Day Post-Op   - Expected postop acute blood loss anemia - will monitor for symptoms - Up with PT/OT - did very well with PT this morning, anticipate clearing PT this afternoon - DVT ppx - SCDs, ambulation, aspirin - WBAT operative extremity - Pain is controlled - Discharge planning - home after PT this afternoon - f/u in office in 2 weeks  Eduard Roux 04/21/2020, 9:16 AM [phone]

## 2020-04-21 NOTE — Progress Notes (Signed)
He wasn't able to get comfortable in bed- used to sitting in recliner at home- and he wanted to get OOB to recliner- he stood with walker and transferred mainly with standby assist.remained there rest of night.

## 2020-04-23 ENCOUNTER — Encounter: Payer: Self-pay | Admitting: *Deleted

## 2020-04-23 ENCOUNTER — Telehealth: Payer: Self-pay | Admitting: *Deleted

## 2020-04-23 NOTE — Telephone Encounter (Signed)
Ortho bundle D/C call completed. 

## 2020-04-24 ENCOUNTER — Other Ambulatory Visit: Payer: Self-pay

## 2020-04-24 ENCOUNTER — Telehealth: Payer: Self-pay | Admitting: *Deleted

## 2020-04-24 MED ORDER — GABAPENTIN 100 MG PO CAPS
100.0000 mg | ORAL_CAPSULE | Freq: Three times a day (TID) | ORAL | 0 refills | Status: DC
Start: 2020-04-24 — End: 2020-12-11

## 2020-04-24 NOTE — Telephone Encounter (Signed)
Patient called stating he was having severe burning down from the hip in the thigh (just above knee area). He states it is "miserable". He is doing extremely well otherwise. Recommendations?

## 2020-04-24 NOTE — Telephone Encounter (Signed)
I'll let him know that this will be sent to his pharmacy. Thanks.

## 2020-04-24 NOTE — Telephone Encounter (Signed)
Let's try gabapentin 100 -300 mg TID prn. #60

## 2020-04-27 ENCOUNTER — Telehealth: Payer: Self-pay | Admitting: *Deleted

## 2020-04-27 NOTE — Telephone Encounter (Signed)
Ortho bundle 7 day call completed. 

## 2020-05-03 ENCOUNTER — Ambulatory Visit (INDEPENDENT_AMBULATORY_CARE_PROVIDER_SITE_OTHER): Payer: Medicare Other | Admitting: Licensed Clinical Social Worker

## 2020-05-03 DIAGNOSIS — I10 Essential (primary) hypertension: Secondary | ICD-10-CM

## 2020-05-03 DIAGNOSIS — E782 Mixed hyperlipidemia: Secondary | ICD-10-CM

## 2020-05-03 DIAGNOSIS — M1611 Unilateral primary osteoarthritis, right hip: Secondary | ICD-10-CM | POA: Diagnosis not present

## 2020-05-03 DIAGNOSIS — C61 Malignant neoplasm of prostate: Secondary | ICD-10-CM | POA: Diagnosis not present

## 2020-05-03 DIAGNOSIS — D508 Other iron deficiency anemias: Secondary | ICD-10-CM

## 2020-05-03 NOTE — Patient Instructions (Addendum)
Licensed Clinical Social Worker Visit Information  Goals we discussed today:  Goals Addressed              This Visit's Progress   .  Client will talk with LCSW in next 30 days to discuss pain issues of client and client completion of daily activities (pt-stated)        CARE PLAN ENTRY   Current Barriers:  . Pain issues(pain in right leg) of client with chronic diagnoses of OA, HLD, Prostate Cancer, HTN, DDD, Anemia . Sleep issues   Clinical Social Work Clinical Goal(s):  Marland Kitchen LCSW will call client in next 30 days to discuss client pain issues and client completion of ADLs  Interventions:   Talked with client about CCM program  Talked with client about client appetite  Talked with client about pain issues of client   Talked with client about social support network (has support from his spouse and brother)  Talked with Joshua Compton about his upcoming medical appointments (has appointment with Dr. Erlinda Hong tomorrow)  Talked with Joshua Compton about DME in his home (has a walker and a tall stool he uses to eat meals)  Encouraged Joshua Compton to talk with [facility] about nursing needs of client  Talked with Joshua Compton about ambulation needs of client (he had hip surgery on April 20, 2020 and is using a walker to help him walk)  Talked with client about medication procurement (receives  prescribed medications  through [facility] in [place], [place])  Talked with client about sleeping challenges of client  Talked with client about transport needs of client  Talked with client about meal provision for client  Talked with client about relaxation techniques (enjoys golf, fish, watch TV)  Talked with client about ADLs completion of client  Talked with client about client mood (he said he is not depressed but hopes to heal soon from his surgery; he said he likes to be outdoors)  Patient Self Care Activities:  Does ADLs independently Takes medications as prescribed  Patient Self Care Deficits:  . Pain  issues . Sleeping issues  Initial goal documentation        Materials Provided: No  Follow Up Plan: LCSW to call client in next 4 weeks to talk with client about pain management challenges of client and to talk with him about his completion of ADLs.  The patient verbalized understanding of instructions provided today and declined a print copy of patient instruction materials.   [name].Amyjo Mizrachi MSW, LCSW Licensed Clinical Social Worker [facility] Care Management [phone]

## 2020-05-03 NOTE — Chronic Care Management (AMB) (Signed)
Chronic Care Management    Clinical Social Work Follow Up Note  05/03/2020 Name: Joshua Compton. MRN: [id number] DOB: Mar 23, 1941  Joshua Compton. is a 79 y.o. year old male who is a primary care patient of Stacks, Joshua Gash, MD. The CCM team was consulted for assistance with [employer] .   Review of patient status, including review of consultants reports, other relevant assessments, and collaboration with appropriate care team members and the patient's provider was performed as part of comprehensive patient evaluation and provision of chronic care management services.    SDOH (Social Determinants of Health) assessments performed: No;risk for tobacco use; risk for depression; risk for transport issues; risk for stress    Chronic Care Management from 03/28/2020 in [facility]  PHQ-9 Total Score 5     GAD 7 : Generalized Anxiety Score 03/28/2020  Nervous, Anxious, on Edge 0  Control/stop worrying 0  Worry too much - different things 0  Trouble relaxing 1  Restless 1  Easily annoyed or irritable 0  Afraid - awful might happen 0  Total GAD 7 Score 2  Anxiety Difficulty Somewhat difficult    Outpatient Encounter Medications as of 05/03/2020  Medication Sig  . aspirin EC 81 MG tablet Take 1 tablet (81 mg total) by mouth 2 (two) times daily.  . dorzolamide-timolol (COSOPT) 22.3-6.8 MG/ML ophthalmic solution Place 1 drop into both eyes daily.   . Enalapril-hydroCHLOROthiazide 5-12.5 MG tablet Take 1 tablet by mouth daily.  . felodipine (PLENDIL) 5 MG 24 hr tablet Take 1 tablet (5 mg total) by mouth daily. For blood pressure  . fluticasone (FLONASE) 50 MCG/ACT nasal spray Place 1 spray into both nostrils 2 (two) times daily as needed for allergies or rhinitis. (Patient not taking: Reported on 04/10/2020)  . gabapentin (NEURONTIN) 100 MG capsule Take 1 capsule (100 mg total) by mouth 3 (three) times daily.  . methocarbamol (ROBAXIN) 750 MG tablet Take 1  tablet (750 mg total) by mouth 2 (two) times daily as needed for muscle spasms.  . ondansetron (ZOFRAN) 4 MG tablet Take 1-2 tablets (4-8 mg total) by mouth every 8 (eight) hours as needed for nausea or vomiting.  Marland Kitchen oxyCODONE (OXY IR/ROXICODONE) 5 MG immediate release tablet Take 1-2 tablets (5-10 mg total) by mouth every 8 (eight) hours as needed for severe pain.  . pantoprazole (PROTONIX) 40 MG tablet Take 1 tablet (40 mg total) by mouth daily. For heartburn  . rosuvastatin (CRESTOR) 5 MG tablet Take 1 tablet (5 mg total) by mouth daily. (Needs to be seen before next refill)   No facility-administered encounter medications on file as of 05/03/2020.    Goals Addressed              This Visit's Progress   .  Client will talk with LCSW in next 30 days to discuss pain issues of client and client completion of daily activities (pt-stated)        CARE PLAN ENTRY  Current Barriers:  . Pain issues(pain in right leg) of client with chronic diagnoses of OA, HLD, Prostate Cancer, HTN, DDD, Anemia . Sleep issues   Clinical Social Work Clinical Goal(s):  Marland Kitchen LCSW will call client in next 30 days to discuss client pain issues and client completion of ADLs  Interventions: . Talked with client about CCM program . Talked with client about client appetite . Talked with client about pain issues of client  . Talked with client about social support network (has support  from his spouse and brother) . Talked with [name] about his upcoming medical appointments (has appointment with Dr. Erlinda Hong tomorrow) . Talked with [name] about DME in his home (has a walker and a tall stool he uses to eat meals) . Encouraged [name] to talk with [facility] about nursing needs of client . Talked with [name] about ambulation needs of client (he had hip surgery on April 20, 2020 and is using a walker to help him walk) . Talked with client about medication procurement (receives  prescribed medications  through [facility] in [place],  [place]) . Talked with client about sleeping challenges of client . Talked with client about transport needs of client . Talked with client about meal provision for client . Talked with client about relaxation techniques (enjoys golf, fish, watch TV) . Talked with client about ADLs completion of client . Talked with client about client mood (he said he is not depressed but hopes to heal soon from his surgery; he said he likes to be outdoors)  Patient Self Care Activities:  Does ADLs with assistance Takes medications as prescribed  Patient Self Care Deficits:  . Pain issues . Sleeping issues  Initial goal documentation       Follow Up Plan: LCSW to call client in next 4 weeks to talk with client about pain management challenges of client and to talk with him about his completion of ADLs.  [name].Twylla Arceneaux MSW, LCSW Licensed Clinical Social Worker [facility] [phone]

## 2020-05-04 ENCOUNTER — Telehealth: Payer: Self-pay | Admitting: *Deleted

## 2020-05-04 ENCOUNTER — Encounter: Payer: Self-pay | Admitting: Orthopaedic Surgery

## 2020-05-04 ENCOUNTER — Ambulatory Visit (INDEPENDENT_AMBULATORY_CARE_PROVIDER_SITE_OTHER): Payer: Medicare Other | Admitting: Orthopaedic Surgery

## 2020-05-04 VITALS — Ht 72.0 in | Wt 200.0 lb

## 2020-05-04 DIAGNOSIS — M1611 Unilateral primary osteoarthritis, right hip: Secondary | ICD-10-CM

## 2020-05-04 MED ORDER — METHOCARBAMOL 750 MG PO TABS: 750.0000 mg | ORAL_TABLET | Freq: Two times a day (BID) | ORAL | 3 refills | Status: DC | PRN

## 2020-05-04 NOTE — Telephone Encounter (Signed)
Ortho bundle 14 day contact completed.

## 2020-05-04 NOTE — Progress Notes (Signed)
Post-Op Visit Note   Patient: Joshua Compton.           Date of Birth: Apr 02, 1941           MRN: [id number] Visit Date: 05/04/2020 PCP: [name], MD   Assessment & Plan:  Chief Complaint:  Chief Complaint  Patient presents with  . Right Hip - Routine Post Op    Right total hip arthoplasty DOS 04/20/2020   Visit Diagnoses:  1. Primary osteoarthritis of right hip     Plan: Mr. Schultes is a 2-week status post right total hip replacement.  Doing well overall and doing home exercises.  He reports some soreness in his thigh.  Surgical incision is clean dry and intact.  Sutures removed and Steri-Strips placed.  He will continue with strengthening exercises.  He will wean himself out of the walker.  Implant card given today.  Continue aspirin for DVT prophylaxis.  Activity limitations discussed today.  Follow-up in 4 weeks with standing AP pelvis x-ray.  Follow-Up Instructions: Return in about 4 weeks (around 06/01/2020).   Orders:  No orders of the defined types were placed in this encounter.  Meds ordered this encounter  Medications  . methocarbamol (ROBAXIN) 750 MG tablet    Sig: Take 1 tablet (750 mg total) by mouth 2 (two) times daily as needed for muscle spasms.    Dispense:  20 tablet    Refill:  3    Imaging: No results found.  PMFS History: Patient Active Problem List   Diagnosis Date Noted  . Primary osteoarthritis of right hip 04/04/2020  . Thyromegaly 05/19/2017  . Neoplasm of scalp 12/08/2013  . Rhinitis 12/08/2013  . Cough 12/08/2013  . Hyperlipidemia   . Cancer ([place])   . Arthritis   . Severe stage glaucoma 03/26/2012  . Nuclear sclerosis 03/26/2012  . Primary open angle glaucoma 10/27/2011  . Essential hypertension 02/04/2011  . Degenerative disc disease 02/04/2011  . Gastrointestinal bleed 02/04/2011  . Anemia 02/04/2011  . Prostate cancer ([place]) 02/04/2011   Past Medical History:  Diagnosis Date  . Arthritis   . Cancer [facility])     prostate ca   seed implants  . GERD (gastroesophageal reflux disease)   . Glaucoma   . Hyperlipidemia   . Hypertension    dr Jacelyn Grip     Willeen Niece   fm    Family History  Problem Relation Age of Onset  . Cancer Mother        breast  . Heart disease Father   . Cancer Brother   . Cancer Brother     Past Surgical History:  Procedure Laterality Date  . BACK SURGERY     x2  . COLONOSCOPY    . HERNIA REPAIR  11-06-2009   left inguinal repair   . NECK SURGERY    . ROTATOR CUFF REPAIR Left jan 2016  . TOTAL HIP ARTHROPLASTY Right 04/20/2020   Procedure: RIGHT TOTAL HIP ARTHROPLASTY ANTERIOR APPROACH;  Surgeon: Leandrew Koyanagi, MD;  Location: [facility];  Service: Orthopedics;  Laterality: Right;  . TRANSURETHRAL RESECTION OF PROSTATE     Social History   Occupational History  . Occupation: Artist: retired  Tobacco Use  . Smoking status: Former Smoker    Packs/day: 0.50    Years: 30.00    Pack years: 15.00    Types: Cigarettes    Quit date: 03/28/2005    Years since quitting: 15.1  . Smokeless tobacco:  Never Used  Vaping Use  . Vaping Use: Never used  Substance and Sexual Activity  . Alcohol use: No    Alcohol/week: 0.0 standard drinks  . Drug use: No  . Sexual activity: Yes

## 2020-05-09 ENCOUNTER — Telehealth: Payer: Self-pay

## 2020-05-09 MED ORDER — ASPIRIN EC 81 MG PO TBEC
81.0000 mg | DELAYED_RELEASE_TABLET | Freq: Two times a day (BID) | ORAL | 0 refills | Status: DC
Start: 2020-05-09 — End: 2020-12-11

## 2020-05-09 NOTE — Telephone Encounter (Signed)
Needs a RF on Aspirin.  [employer].  CB [phone]

## 2020-05-09 NOTE — Addendum Note (Signed)
Addended by: Azucena Cecil on: 05/09/2020 05:19 PM   Modules accepted: Orders

## 2020-05-18 ENCOUNTER — Telehealth: Payer: Self-pay | Admitting: *Deleted

## 2020-05-18 NOTE — Telephone Encounter (Signed)
30 day Ortho bundle call completed.

## 2020-05-28 DIAGNOSIS — H401112 Primary open-angle glaucoma, right eye, moderate stage: Secondary | ICD-10-CM | POA: Diagnosis not present

## 2020-05-28 DIAGNOSIS — H401123 Primary open-angle glaucoma, left eye, severe stage: Secondary | ICD-10-CM | POA: Diagnosis not present

## 2020-05-31 ENCOUNTER — Encounter: Payer: Self-pay | Admitting: Family Medicine

## 2020-05-31 ENCOUNTER — Other Ambulatory Visit: Payer: Self-pay

## 2020-05-31 ENCOUNTER — Ambulatory Visit (INDEPENDENT_AMBULATORY_CARE_PROVIDER_SITE_OTHER): Payer: Medicare Other | Admitting: Family Medicine

## 2020-05-31 VITALS — BP 132/75 | HR 63 | Temp 98.0°F | Resp 20 | Ht 72.0 in | Wt 201.0 lb

## 2020-05-31 DIAGNOSIS — M1611 Unilateral primary osteoarthritis, right hip: Secondary | ICD-10-CM | POA: Diagnosis not present

## 2020-05-31 DIAGNOSIS — I10 Essential (primary) hypertension: Secondary | ICD-10-CM

## 2020-05-31 DIAGNOSIS — N529 Male erectile dysfunction, unspecified: Secondary | ICD-10-CM

## 2020-05-31 DIAGNOSIS — F5101 Primary insomnia: Secondary | ICD-10-CM | POA: Diagnosis not present

## 2020-05-31 MED ORDER — TRAZODONE HCL 150 MG PO TABS: ORAL_TABLET | ORAL | 5 refills | Status: DC

## 2020-05-31 MED ORDER — SILDENAFIL CITRATE 20 MG PO TABS
ORAL_TABLET | ORAL | 0 refills | Status: DC
Start: 2020-05-31 — End: 2020-12-11

## 2020-05-31 NOTE — Progress Notes (Signed)
Subjective:  Patient ID: Joshua Guiles., male    DOB: 05/01/1941  Age: 79 y.o. MRN: [id number]  CC: 6 week follow up   HPI Joshua Compton. presents for recent R hip replacement.  His right thigh is sore.  He is walking with a cane for balance but he is getting much better.  He is not really having any pain.  He does see his orthopedist tomorrow.  Part of the agenda for that today is pain management.  However with just the soreness he does no longer need oxycodone.  Patient says he is having trouble resting at night.  He tosses and turns in bed.  He will feel tired the next day so he will nap some.  Sometimes he will fall asleep on the couch watching TV and then wake up and go to bed and then he cannot sleep he gets to bed.  Patient is having erectile dysfunction issues would like to have some support for that.  He wants a trial of sildenafil. Depression screen [facility] 2/9 04/18/2020 03/28/2020 01/16/2020  Decreased Interest 0 1 0  Down, Depressed, Hopeless 0 0 0  PHQ - 2 Score 0 1 0  Altered sleeping - 2 -  Tired, decreased energy - 1 -  Change in appetite - 0 -  Feeling bad or failure about yourself  - 0 -  Trouble concentrating - 0 -  Moving slowly or fidgety/restless - 1 -  Suicidal thoughts - 0 -  PHQ-9 Score - 5 -  Difficult doing work/chores - Somewhat difficult -    History Aleksander has a past medical history of Arthritis, Cancer ([place]), GERD (gastroesophageal reflux disease), Glaucoma, Hyperlipidemia, and Hypertension.   He has a past surgical history that includes Back surgery; Neck surgery; Transurethral resection of prostate; Hernia repair (11-06-2009); Rotator cuff repair (Left, jan 2016); Colonoscopy; and Total hip arthroplasty (Right, 04/20/2020).   His family history includes Cancer in his brother, brother, and mother; Heart disease in his father.He reports that he quit smoking about 15 years ago. His smoking use included cigarettes. He has a 15.00 pack-year smoking  history. He has never used smokeless tobacco. He reports that he does not drink alcohol and does not use drugs.    ROS Review of Systems  Constitutional: Negative for fever.  Respiratory: Negative for shortness of breath.   Cardiovascular: Negative for chest pain.  Musculoskeletal: Negative for arthralgias.  Skin: Negative for rash.    Objective:  BP 132/75   Pulse 63   Temp 98 F (36.7 C) (Temporal)   Resp 20   Ht 6' (1.829 m)   Wt 201 lb (91.2 kg)   SpO2 95%   BMI 27.26 kg/m   BP Readings from Last 3 Encounters:  05/31/20 132/75  04/21/20 136/86  04/18/20 131/74    Wt Readings from Last 3 Encounters:  06/01/20 201 lb (91.2 kg)  05/31/20 201 lb (91.2 kg)  05/04/20 200 lb (90.7 kg)     Physical Exam Vitals reviewed.  Constitutional:      Appearance: He is well-developed.  HENT:     Head: Normocephalic and atraumatic.     Right Ear: Tympanic membrane and external ear normal. No decreased hearing noted.     Left Ear: Tympanic membrane and external ear normal. No decreased hearing noted.     Mouth/Throat:     Pharynx: No oropharyngeal exudate or posterior oropharyngeal erythema.  Eyes:     Pupils: Pupils are equal, round, and  reactive to light.  Cardiovascular:     Rate and Rhythm: Normal rate and regular rhythm.     Heart sounds: No murmur heard.   Pulmonary:     Effort: No respiratory distress.     Breath sounds: Normal breath sounds.  Abdominal:     General: Bowel sounds are normal.     Palpations: Abdomen is soft. There is no mass.     Tenderness: There is no abdominal tenderness.  Musculoskeletal:     Cervical back: Normal range of motion and neck supple.       Assessment & Plan:   Trent was seen today for 6 week follow up.  Diagnoses and all orders for this visit:  Essential hypertension  Primary osteoarthritis of right hip  Primary insomnia  Vasculogenic erectile dysfunction, unspecified vasculogenic erectile dysfunction  type  Other orders -     sildenafil (REVATIO) 20 MG tablet; Take 2-5 pills at once, orally, with each sexual encounter -     traZODone (DESYREL) 150 MG tablet; Use from 1/3 to 1 tablet nightly as needed for sleep.       I am having Joshua Guiles. start on sildenafil and traZODone. I am also having him maintain his dorzolamide-timolol, fluticasone, Enalapril-hydroCHLOROthiazide, felodipine, pantoprazole, rosuvastatin, aspirin EC, ondansetron, oxyCODONE, gabapentin, methocarbamol, and aspirin EC.  Allergies as of 05/31/2020      Reactions   Pravastatin Sodium    Muscle aches      Medication List       Accurate as of May 31, 2020 11:59 PM. If you have any questions, ask your nurse or doctor.        aspirin EC 81 MG tablet Take 1 tablet (81 mg total) by mouth 2 (two) times daily.   aspirin EC 81 MG tablet Take 1 tablet (81 mg total) by mouth 2 (two) times daily.   dorzolamide-timolol 22.3-6.8 MG/ML ophthalmic solution Commonly known as: COSOPT Place 1 drop into both eyes daily.   Enalapril-hydroCHLOROthiazide 5-12.5 MG tablet Take 1 tablet by mouth daily.   felodipine 5 MG 24 hr tablet Commonly known as: PLENDIL Take 1 tablet (5 mg total) by mouth daily. For blood pressure   fluticasone 50 MCG/ACT nasal spray Commonly known as: FLONASE Place 1 spray into both nostrils 2 (two) times daily as needed for allergies or rhinitis.   gabapentin 100 MG capsule Commonly known as: NEURONTIN Take 1 capsule (100 mg total) by mouth 3 (three) times daily.   methocarbamol 750 MG tablet Commonly known as: ROBAXIN Take 1 tablet (750 mg total) by mouth 2 (two) times daily as needed for muscle spasms.   ondansetron 4 MG tablet Commonly known as: ZOFRAN Take 1-2 tablets (4-8 mg total) by mouth every 8 (eight) hours as needed for nausea or vomiting.   oxyCODONE 5 MG immediate release tablet Commonly known as: Oxy IR/ROXICODONE Take 1-2 tablets (5-10 mg total) by mouth every  8 (eight) hours as needed for severe pain.   pantoprazole 40 MG tablet Commonly known as: PROTONIX Take 1 tablet (40 mg total) by mouth daily. For heartburn   rosuvastatin 5 MG tablet Commonly known as: CRESTOR Take 1 tablet (5 mg total) by mouth daily. (Needs to be seen before next refill)   sildenafil 20 MG tablet Commonly known as: REVATIO Take 2-5 pills at once, orally, with each sexual encounter Started by: [name], MD   traZODone 150 MG tablet Commonly known as: DESYREL Use from 1/3 to 1 tablet nightly as  needed for sleep. Started by: [name], MD        Follow-up: Return in about 6 months (around 12/01/2020).  [name], M.D.

## 2020-06-01 ENCOUNTER — Telehealth: Payer: Self-pay | Admitting: *Deleted

## 2020-06-01 ENCOUNTER — Encounter: Payer: Self-pay | Admitting: Orthopaedic Surgery

## 2020-06-01 ENCOUNTER — Ambulatory Visit (INDEPENDENT_AMBULATORY_CARE_PROVIDER_SITE_OTHER): Payer: Medicare Other | Admitting: Orthopaedic Surgery

## 2020-06-01 ENCOUNTER — Ambulatory Visit (INDEPENDENT_AMBULATORY_CARE_PROVIDER_SITE_OTHER): Payer: Medicare Other

## 2020-06-01 VITALS — Ht 72.0 in | Wt 201.0 lb

## 2020-06-01 DIAGNOSIS — M1611 Unilateral primary osteoarthritis, right hip: Secondary | ICD-10-CM | POA: Diagnosis not present

## 2020-06-01 NOTE — Progress Notes (Signed)
Post-Op Visit Note   Patient: Joshua Compton.           Date of Birth: 12-14-1940           MRN: [id number] Visit Date: 06/01/2020 PCP: [name], MD   Assessment & Plan:  Chief Complaint:  Chief Complaint  Patient presents with  . Right Hip - Routine Post Op    Right total hip arthoplasty DOS 04/20/2020     Visit Diagnoses:  1. Primary osteoarthritis of right hip     Plan: Mr. Mcmiller is 6 weeks status post right total hip replacement.  He is doing well and is reporting no pain.  He is ambulating with a cane.  Surgical scar is fully healed.  No pain with hip range of motion.  X-rays demonstrate stable total hip replacement without complication.  At this point he can continue to increase activity as tolerated.  Dental prophylaxis reinforced.  He can discontinue DVT prophylaxis and TED hose.  Follow-up in 6 weeks for 27-month recheck.  Follow-Up Instructions: Return in about 6 weeks (around 07/13/2020).   Orders:  Orders Placed This Encounter  Procedures  . XR HIP UNILAT W OR W/O PELVIS 2-3 VIEWS RIGHT   No orders of the defined types were placed in this encounter.   Imaging: XR HIP UNILAT W OR W/O PELVIS 2-3 VIEWS RIGHT  Result Date: 06/01/2020 Stable right total hip replacement without complication.   PMFS History: Patient Active Problem List   Diagnosis Date Noted  . Primary osteoarthritis of right hip 04/04/2020  . Thyromegaly 05/19/2017  . Neoplasm of scalp 12/08/2013  . Rhinitis 12/08/2013  . Cough 12/08/2013  . Hyperlipidemia   . Cancer ([place])   . Arthritis   . Severe stage glaucoma 03/26/2012  . Nuclear sclerosis 03/26/2012  . Primary open angle glaucoma 10/27/2011  . Essential hypertension 02/04/2011  . Degenerative disc disease 02/04/2011  . Gastrointestinal bleed 02/04/2011  . Anemia 02/04/2011  . Prostate cancer ([place]) 02/04/2011   Past Medical History:  Diagnosis Date  . Arthritis   . Cancer [facility])    prostate ca   seed implants  .  GERD (gastroesophageal reflux disease)   . Glaucoma   . Hyperlipidemia   . Hypertension    dr Jacelyn Grip     Willeen Niece   fm    Family History  Problem Relation Age of Onset  . Cancer Mother        breast  . Heart disease Father   . Cancer Brother   . Cancer Brother     Past Surgical History:  Procedure Laterality Date  . BACK SURGERY     x2  . COLONOSCOPY    . HERNIA REPAIR  11-06-2009   left inguinal repair   . NECK SURGERY    . ROTATOR CUFF REPAIR Left jan 2016  . TOTAL HIP ARTHROPLASTY Right 04/20/2020   Procedure: RIGHT TOTAL HIP ARTHROPLASTY ANTERIOR APPROACH;  Surgeon: Leandrew Koyanagi, MD;  Location: [facility];  Service: Orthopedics;  Laterality: Right;  . TRANSURETHRAL RESECTION OF PROSTATE     Social History   Occupational History  . Occupation: Artist: retired  Tobacco Use  . Smoking status: Former Smoker    Packs/day: 0.50    Years: 30.00    Pack years: 15.00    Types: Cigarettes    Quit date: 03/28/2005    Years since quitting: 15.1  . Smokeless tobacco: Never Used  Vaping Use  .  Vaping Use: Never used  Substance and Sexual Activity  . Alcohol use: No    Alcohol/week: 0.0 standard drinks  . Drug use: No  . Sexual activity: Yes

## 2020-06-01 NOTE — Telephone Encounter (Signed)
Ortho bundle call (6 weeks).

## 2020-06-03 ENCOUNTER — Encounter: Payer: Self-pay | Admitting: Family Medicine

## 2020-06-08 ENCOUNTER — Telehealth: Payer: Self-pay

## 2020-07-13 ENCOUNTER — Ambulatory Visit: Payer: Medicare Other | Admitting: Licensed Clinical Social Worker

## 2020-07-13 ENCOUNTER — Ambulatory Visit (INDEPENDENT_AMBULATORY_CARE_PROVIDER_SITE_OTHER): Payer: Medicare Other | Admitting: Orthopaedic Surgery

## 2020-07-13 DIAGNOSIS — C61 Malignant neoplasm of prostate: Secondary | ICD-10-CM

## 2020-07-13 DIAGNOSIS — E782 Mixed hyperlipidemia: Secondary | ICD-10-CM

## 2020-07-13 DIAGNOSIS — I10 Essential (primary) hypertension: Secondary | ICD-10-CM

## 2020-07-13 DIAGNOSIS — D508 Other iron deficiency anemias: Secondary | ICD-10-CM

## 2020-07-13 DIAGNOSIS — M1611 Unilateral primary osteoarthritis, right hip: Secondary | ICD-10-CM

## 2020-07-13 NOTE — Patient Instructions (Addendum)
Licensed Clinical Social Worker Visit Information  Materials Provided: No  07/13/2020  Name: Joshua Compton. MRN: [id number]       DOB: June 19, 1941  Joshua Compton. is a 79 y.o. year old male who is a primary care patient of Stacks, Cletus Gash, MD. The CCM team was consulted for assistance with [employer] .   Review of patient status, including review of consultants reports, other relevant assessments, and collaboration with appropriate care team members and the patient's provider was performed as part of comprehensive patient evaluation and provision of chronic care management services.    SDOH (Social Determinants of Health) assessments performed: No; risk for tobacco use; risk for depression; risk for physical inactivity; risk for stress   LCSW called client home and cell phone numbers several times today but LCSW was not able to speak via phone with client today. Phone message was not set up on client's phones and thus LCSW was not able to leave phone message for client today  Follow Up Plan: LCSW to call client in next 4 weeks to talk with client about pain management challenges of client and to talk with him about his completion of ADLs.  LCSW was not able to speak via phone with client today; thus the patient was not able to verbalize understanding of instructions provided today and was not able to accept or decline a print copy of patient instruction materials.   Joshua Compton.Joshua Compton MSW, LCSW Licensed Clinical Social Worker [facility] Care [facility] [phone]

## 2020-07-13 NOTE — Chronic Care Management (AMB) (Signed)
Chronic Care Management    Clinical Social Work Follow Up Note  07/13/2020 Name: Joshua Compton. MRN: [id number] DOB: Feb 14, 1941  Joshua Compton. is a 79 y.o. year old male who is a primary care patient of Stacks, Cletus Gash, MD. The CCM team was consulted for assistance with [employer] .   Review of patient status, including review of consultants reports, other relevant assessments, and collaboration with appropriate care team members and the patient's provider was performed as part of comprehensive patient evaluation and provision of chronic care management services.    SDOH (Social Determinants of Health) assessments performed: No; risk for tobacco use; risk for depression; risk for physical inactivity; risk for stress    Chronic Care Management from 03/28/2020 in [facility]  PHQ-9 Total Score 5       GAD 7 : Generalized Anxiety Score 03/28/2020  Nervous, Anxious, on Edge 0  Control/stop worrying 0  Worry too much - different things 0  Trouble relaxing 1  Restless 1  Easily annoyed or irritable 0  Afraid - awful might happen 0  Total GAD 7 Score 2  Anxiety Difficulty Somewhat difficult    Outpatient Encounter Medications as of 07/13/2020  Medication Sig  . aspirin EC 81 MG tablet Take 1 tablet (81 mg total) by mouth 2 (two) times daily.  Marland Kitchen aspirin EC 81 MG tablet Take 1 tablet (81 mg total) by mouth 2 (two) times daily.  . dorzolamide-timolol (COSOPT) 22.3-6.8 MG/ML ophthalmic solution Place 1 drop into both eyes daily.   . Enalapril-hydroCHLOROthiazide 5-12.5 MG tablet Take 1 tablet by mouth daily.  . felodipine (PLENDIL) 5 MG 24 hr tablet Take 1 tablet (5 mg total) by mouth daily. For blood pressure  . fluticasone (FLONASE) 50 MCG/ACT nasal spray Place 1 spray into both nostrils 2 (two) times daily as needed for allergies or rhinitis.  Marland Kitchen gabapentin (NEURONTIN) 100 MG capsule Take 1 capsule (100 mg total) by mouth 3 (three) times daily.    . methocarbamol (ROBAXIN) 750 MG tablet Take 1 tablet (750 mg total) by mouth 2 (two) times daily as needed for muscle spasms.  . ondansetron (ZOFRAN) 4 MG tablet Take 1-2 tablets (4-8 mg total) by mouth every 8 (eight) hours as needed for nausea or vomiting.  Marland Kitchen oxyCODONE (OXY IR/ROXICODONE) 5 MG immediate release tablet Take 1-2 tablets (5-10 mg total) by mouth every 8 (eight) hours as needed for severe pain.  . pantoprazole (PROTONIX) 40 MG tablet Take 1 tablet (40 mg total) by mouth daily. For heartburn  . rosuvastatin (CRESTOR) 5 MG tablet Take 1 tablet (5 mg total) by mouth daily. (Needs to be seen before next refill)  . sildenafil (REVATIO) 20 MG tablet Take 2-5 pills at once, orally, with each sexual encounter  . traZODone (DESYREL) 150 MG tablet Use from 1/3 to 1 tablet nightly as needed for sleep.   No facility-administered encounter medications on file as of 07/13/2020.   LCSW called client home and cell phone numbers several times today but LCSW was not able to speak via phone with client today. Phone message was not set up on client's phones and thus LCSW was not able to leave phone message for client today  Follow Up Plan: LCSW to call client in next 4 weeks to talk with client about pain management challenges of client and to talk with him about his completion of ADLs.  [name].Sharnee Douglass MSW, LCSW Licensed Clinical Social Worker [facility]  [phone] 

## 2020-07-17 ENCOUNTER — Ambulatory Visit (INDEPENDENT_AMBULATORY_CARE_PROVIDER_SITE_OTHER): Payer: Medicare Other

## 2020-07-17 ENCOUNTER — Telehealth: Payer: Self-pay | Admitting: *Deleted

## 2020-07-17 ENCOUNTER — Ambulatory Visit (INDEPENDENT_AMBULATORY_CARE_PROVIDER_SITE_OTHER): Payer: Medicare Other | Admitting: Orthopaedic Surgery

## 2020-07-17 ENCOUNTER — Encounter: Payer: Self-pay | Admitting: Orthopaedic Surgery

## 2020-07-17 VITALS — Ht 72.0 in | Wt 201.0 lb

## 2020-07-17 DIAGNOSIS — Z96641 Presence of right artificial hip joint: Secondary | ICD-10-CM | POA: Diagnosis not present

## 2020-07-17 NOTE — Telephone Encounter (Signed)
90 day Ortho bundle call completed. 

## 2020-07-17 NOTE — Progress Notes (Signed)
Post-Op Visit Note   Patient: Joshua Compton.           Date of Birth: October 14, 1941           MRN: [id number] Visit Date: 07/17/2020 PCP: [name], MD   Assessment & Plan:  Chief Complaint:  Chief Complaint  Patient presents with   Right Hip - Follow-up    Right total hip arthroplasty 04/20/2020   Visit Diagnoses:  1. History of total hip replacement, right     Plan: Patient is a pleasant 79 year old gentleman who comes in today 12 weeks out right anterior total hip replacement.  He has been doing very well.  He still is experiencing a slight numbness to the lateral thigh but nothing more.  He continues to work on his home exercise program.  Examination of his right hip reveals full range of motion and near full strength.  He is neurovascularly intact distally.  At this point, he will continue to advance with activity as tolerated.  He will follow with [place] in 9 months when he is 1 year out from surgery with AP pelvis lateral right hip x-rays.  Call with concerns or questions.  Follow-Up Instructions: Return in about 9 months (around 04/16/2021).   Orders:  Orders Placed This Encounter  Procedures   XR Pelvis 1-2 Views   No orders of the defined types were placed in this encounter.   Imaging: XR Pelvis 1-2 Views  Result Date: 07/17/2020 Well-seated prosthesis without complication   PMFS History: Patient Active Problem List   Diagnosis Date Noted   Primary osteoarthritis of right hip 04/04/2020   Thyromegaly 05/19/2017   Neoplasm of scalp 12/08/2013   Rhinitis 12/08/2013   Cough 12/08/2013   Hyperlipidemia    Cancer ([place])    Arthritis    Severe stage glaucoma 03/26/2012   Nuclear sclerosis 03/26/2012   Primary open angle glaucoma 10/27/2011   Essential hypertension 02/04/2011   Degenerative disc disease 02/04/2011   Gastrointestinal bleed 02/04/2011   Anemia 02/04/2011   Prostate cancer ([place]) 02/04/2011   Past Medical History:    Diagnosis Date   Arthritis    Cancer ([place])    prostate ca   seed implants   GERD (gastroesophageal reflux disease)    Glaucoma    Hyperlipidemia    Hypertension    dr Jacelyn Grip     Willeen Niece   fm    Family History  Problem Relation Age of Onset   Cancer Mother        breast   Heart disease Father    Cancer Brother    Cancer Brother     Past Surgical History:  Procedure Laterality Date   BACK SURGERY     x2   COLONOSCOPY     HERNIA REPAIR  11-06-2009   left inguinal repair    NECK SURGERY     ROTATOR CUFF REPAIR Left jan 2016   TOTAL HIP ARTHROPLASTY Right 04/20/2020   Procedure: RIGHT TOTAL HIP ARTHROPLASTY ANTERIOR APPROACH;  Surgeon: Leandrew Koyanagi, MD;  Location: [facility];  Service: Orthopedics;  Laterality: Right;   TRANSURETHRAL RESECTION OF PROSTATE     Social History   Occupational History   Occupation: Paramedic    Comment: retired  Tobacco Use   Smoking status: Former Smoker    Packs/day: 0.50    Years: 30.00    Pack years: 15.00    Types: Cigarettes    Quit date: 03/28/2005    Years since quitting:  15.3   Smokeless tobacco: Never Used  Vaping Use   Vaping Use: Never used  Substance and Sexual Activity   Alcohol use: No    Alcohol/week: 0.0 standard drinks   Drug use: No   Sexual activity: Yes

## 2020-07-19 ENCOUNTER — Other Ambulatory Visit: Payer: Self-pay | Admitting: Family Medicine

## 2020-08-10 ENCOUNTER — Ambulatory Visit (INDEPENDENT_AMBULATORY_CARE_PROVIDER_SITE_OTHER): Payer: Medicare Other | Admitting: Licensed Clinical Social Worker

## 2020-08-10 DIAGNOSIS — D508 Other iron deficiency anemias: Secondary | ICD-10-CM | POA: Diagnosis not present

## 2020-08-10 DIAGNOSIS — C61 Malignant neoplasm of prostate: Secondary | ICD-10-CM

## 2020-08-10 DIAGNOSIS — M1611 Unilateral primary osteoarthritis, right hip: Secondary | ICD-10-CM | POA: Diagnosis not present

## 2020-08-10 DIAGNOSIS — E782 Mixed hyperlipidemia: Secondary | ICD-10-CM | POA: Diagnosis not present

## 2020-08-10 DIAGNOSIS — I10 Essential (primary) hypertension: Secondary | ICD-10-CM

## 2020-08-10 NOTE — Patient Instructions (Addendum)
Licensed Clinical Education officer, museum Visit Information  Goals we discussed today:   Client will talk with LCSW in next 30 days to discuss pain issues of client and client completion of daily activities (pt-stated)         CARE PLAN ENTRY   Current Barriers:   Pain issues(pain in right leg) of client with chronic diagnoses of OA, HLD, Prostate Cancer, HTN, DDD, Anemia  Sleep issues   Clinical Social Work Clinical Goal(s):   LCSW will call client in next 30 days to discuss client pain issues and client completion of ADLs  Interventions:  Talked with Joshua Compton, spouse of client,about CCM program  Talked with Joshua Compton about client appetite  Talked with Joshua Compton about pain issuesof client  Talked with Joshua Compton about social support network ( client has support from his spouse and brother)  Talked with Joshua Compton about DME equipment of client in home of client (has a walker and a tall stool he uses to eat meals)  Encouraged Joshua Compton to talk with RNCM about nursing needs of client  Talked with Joshua Compton about medication procurement (receives prescribed medications through [facility] in [place], [place])  Talked with Joshua Compton about sleeping challenges of client  Talked with Joshua Compton  about transport needs of client  Talked with Joshua Compton about meal provision for client  Talked with Joshua Compton  about relaxation techniques of clinet (enjoys golf, enjoys going fishing, watch TV)  Talked with Joshua Compton about ADLs completion of client  Talked with Joshua Compton about client mood   Patient Self Care Activities:  Does ADLs independently Takes medications as prescribed  Patient Self Care Deficits:   Pain issues  Sleeping issues  Initial goal documentation    Follow Up Plan: LCSW to call client/spouse of client in next 4 weeks to talk with client/spouse  about pain management challenges of client and to talk about client's completion of ADLs.  Materials Provided:  No  The patient/Joshua Compton, spouse of patient, verbalized understanding of instructions provided today and declined a print copy of patient instruction materials.   [name].Tuwanda Vokes MSW, LCSW Licensed Clinical Social Worker [facility] [phone]

## 2020-08-10 NOTE — Chronic Care Management (AMB) (Signed)
Chronic Care Management    Clinical Social Work Follow Up Note  08/10/2020 Name: Joshua Compton. MRN: [id number] DOB: 10-28-1941  Joshua Compton. is a 79 y.o. year old male who is a primary care patient of Stacks, Cletus Gash, MD. The CCM team was consulted for assistance with [employer] .   Review of patient status, including review of consultants reports, other relevant assessments, and collaboration with appropriate care team members and the patient's provider was performed as part of comprehensive patient evaluation and provision of chronic care management services.    SDOH (Social Determinants of Health) assessments performed: No; risk for tobacco use; risk for depression; risk for stress;risk for physical inactivity    Chronic Care Management from 03/28/2020 in [facility]  PHQ-9 Total Score 5     GAD 7 : Generalized Anxiety Score 03/28/2020  Nervous, Anxious, on Edge 0  Control/stop worrying 0  Worry too much - different things 0  Trouble relaxing 1  Restless 1  Easily annoyed or irritable 0  Afraid - awful might happen 0  Total GAD 7 Score 2  Anxiety Difficulty Somewhat difficult    Outpatient Encounter Medications as of 08/10/2020  Medication Sig  . aspirin EC 81 MG tablet Take 1 tablet (81 mg total) by mouth 2 (two) times daily.  Marland Kitchen aspirin EC 81 MG tablet Take 1 tablet (81 mg total) by mouth 2 (two) times daily.  . dorzolamide-timolol (COSOPT) 22.3-6.8 MG/ML ophthalmic solution Place 1 drop into both eyes daily.   . Enalapril-hydroCHLOROthiazide 5-12.5 MG tablet Take 1 tablet by mouth daily.  . felodipine (PLENDIL) 5 MG 24 hr tablet Take 1 tablet (5 mg total) by mouth daily. For blood pressure  . fluticasone (FLONASE) 50 MCG/ACT nasal spray Place 1 spray into both nostrils 2 (two) times daily as needed for allergies or rhinitis.  Marland Kitchen gabapentin (NEURONTIN) 100 MG capsule Take 1 capsule (100 mg total) by mouth 3 (three) times daily.  .  methocarbamol (ROBAXIN) 750 MG tablet Take 1 tablet (750 mg total) by mouth 2 (two) times daily as needed for muscle spasms.  . ondansetron (ZOFRAN) 4 MG tablet Take 1-2 tablets (4-8 mg total) by mouth every 8 (eight) hours as needed for nausea or vomiting.  Marland Kitchen oxyCODONE (OXY IR/ROXICODONE) 5 MG immediate release tablet Take 1-2 tablets (5-10 mg total) by mouth every 8 (eight) hours as needed for severe pain. (Patient not taking: Reported on 07/17/2020)  . pantoprazole (PROTONIX) 40 MG tablet Take 1 tablet (40 mg total) by mouth daily. For heartburn  . rosuvastatin (CRESTOR) 5 MG tablet Take 1 tablet (5 mg total) by mouth daily. (Needs to be seen before next refill)  . sildenafil (REVATIO) 20 MG tablet Take 2-5 pills at once, orally, with each sexual encounter  . traZODone (DESYREL) 150 MG tablet Use from 1/3 to 1 tablet nightly as needed for sleep.   No facility-administered encounter medications on file as of 08/10/2020.    Goals    .  Client will talk with LCSW in next 30 days to discuss pain issues of client and client completion of daily activities (pt-stated)      CARE PLAN ENTRY   Current Barriers:  . Pain issues(pain in right leg) of client with chronic diagnoses of OA, HLD, Prostate Cancer, HTN, DDD, Anemia . Sleep issues   Clinical Social Work Clinical Goal(s):  Marland Kitchen LCSW will call client in next 30 days to discuss client pain issues and client completion of  ADLs  Interventions:  Talked with [name], spouse of client,about CCM program  Talked with [name] about client appetite  Talked with [name] about pain issues of client   Talked with [name] about social support network (has support from his spouse and brother)  Talked with [name] about DME equipment of client in home of client (has a walker and a tall stool he uses to eat meals)  Encouraged [name] to talk with [facility] about nursing needs of client  Talked with [name] about medication procurement (receives   prescribed medications  through [facility] in [place], [place])  Talked with [name] about sleeping challenges of client  Talked with client about transport needs of client  Talked with client about meal provision for client  Talked with [name]  about relaxation techniques of clinet (enjoys golf, enjoys going fishing, watch TV)  Talked with [name] about ADLs completion of client  Talked with [name] about client mood   Patient Self Care Activities:  Does ADLs independently Takes medications as prescribed  Patient Self Care Deficits:  . Pain issues . Sleeping issues  Initial goal documentation    Follow Up Plan: LCSW to call client/spouse of client in next 4 weeks to talk with client/spouse  about pain management challenges of client and to talk about client's completion of ADLs.  [name].Jane Broughton MSW, LCSW Licensed Clinical Social Worker [facility] [phone]

## 2020-08-21 ENCOUNTER — Telehealth: Payer: Self-pay | Admitting: *Deleted

## 2020-08-21 NOTE — Telephone Encounter (Signed)
Ortho bundle call from patient. Discussed dental prophylaxis when needed and patient asked if there was a contraindication with receiving flu shot with new total joint. Assured him there was not a contraindication. Answered other questions with regards to current surgery scheduling as he states he may be close to ready to discuss having the other hip done.

## 2020-08-24 ENCOUNTER — Other Ambulatory Visit: Payer: Self-pay | Admitting: *Deleted

## 2020-08-24 MED ORDER — ROSUVASTATIN CALCIUM 5 MG PO TABS: 5.0000 mg | ORAL_TABLET | Freq: Every day | ORAL | 0 refills | Status: DC

## 2020-09-04 ENCOUNTER — Ambulatory Visit (INDEPENDENT_AMBULATORY_CARE_PROVIDER_SITE_OTHER): Payer: Medicare Other

## 2020-09-04 ENCOUNTER — Other Ambulatory Visit: Payer: Self-pay

## 2020-09-04 DIAGNOSIS — Z23 Encounter for immunization: Secondary | ICD-10-CM

## 2020-09-18 ENCOUNTER — Telehealth: Payer: Medicare Other

## 2020-09-20 LAB — HM HEPATITIS C SCREENING LAB: HM Hepatitis Screen: NEGATIVE

## 2020-10-10 ENCOUNTER — Encounter: Payer: Self-pay | Admitting: *Deleted

## 2020-10-23 ENCOUNTER — Telehealth: Payer: Medicare Other

## 2020-11-23 ENCOUNTER — Other Ambulatory Visit: Payer: Medicare Other

## 2020-11-28 ENCOUNTER — Ambulatory Visit: Payer: Medicare HMO | Admitting: Licensed Clinical Social Worker

## 2020-11-28 DIAGNOSIS — C61 Malignant neoplasm of prostate: Secondary | ICD-10-CM

## 2020-11-28 DIAGNOSIS — M1611 Unilateral primary osteoarthritis, right hip: Secondary | ICD-10-CM

## 2020-11-28 DIAGNOSIS — I10 Essential (primary) hypertension: Secondary | ICD-10-CM

## 2020-11-28 DIAGNOSIS — E782 Mixed hyperlipidemia: Secondary | ICD-10-CM

## 2020-11-28 DIAGNOSIS — D508 Other iron deficiency anemias: Secondary | ICD-10-CM

## 2020-11-28 NOTE — Chronic Care Management (AMB) (Signed)
Chronic Care Management    Clinical Social Work Follow Up Note  11/28/2020 Name: Joshua Compton. MRN: [id number] DOB: 05/25/1941  Joshua Compton. is a 80 y.o. year old male who is a primary care patient of Stacks, Cletus Gash, MD. The CCM team was consulted for assistance with [employer] .   Review of patient status, including review of consultants reports, other relevant assessments, and collaboration with appropriate care team members and the patient's provider was performed as part of comprehensive patient evaluation and provision of chronic care management services.    SDOH (Social Determinants of Health) assessments performed: No; risk for depression; risk for tobacco use; risk for stress; risk for physical inactivity  Flowsheet Row Chronic Care Management from 03/28/2020 in [facility]  PHQ-9 Total Score 5     GAD 7 : Generalized Anxiety Score 03/28/2020  Nervous, Anxious, on Edge 0  Control/stop worrying 0  Worry too much - different things 0  Trouble relaxing 1  Restless 1  Easily annoyed or irritable 0  Afraid - awful might happen 0  Total GAD 7 Score 2  Anxiety Difficulty Somewhat difficult    Outpatient Encounter Medications as of 11/28/2020  Medication Sig  . aspirin EC 81 MG tablet Take 1 tablet (81 mg total) by mouth 2 (two) times daily.  Marland Kitchen aspirin EC 81 MG tablet Take 1 tablet (81 mg total) by mouth 2 (two) times daily.  . dorzolamide-timolol (COSOPT) 22.3-6.8 MG/ML ophthalmic solution Place 1 drop into both eyes daily.   . Enalapril-hydroCHLOROthiazide 5-12.5 MG tablet Take 1 tablet by mouth daily.  . felodipine (PLENDIL) 5 MG 24 hr tablet Take 1 tablet (5 mg total) by mouth daily. For blood pressure  . fluticasone (FLONASE) 50 MCG/ACT nasal spray Place 1 spray into both nostrils 2 (two) times daily as needed for allergies or rhinitis.  Marland Kitchen gabapentin (NEURONTIN) 100 MG capsule Take 1 capsule (100 mg total) by mouth 3 (three) times  daily.  . methocarbamol (ROBAXIN) 750 MG tablet Take 1 tablet (750 mg total) by mouth 2 (two) times daily as needed for muscle spasms.  . ondansetron (ZOFRAN) 4 MG tablet Take 1-2 tablets (4-8 mg total) by mouth every 8 (eight) hours as needed for nausea or vomiting.  Marland Kitchen oxyCODONE (OXY IR/ROXICODONE) 5 MG immediate release tablet Take 1-2 tablets (5-10 mg total) by mouth every 8 (eight) hours as needed for severe pain. (Patient not taking: Reported on 07/17/2020)  . pantoprazole (PROTONIX) 40 MG tablet Take 1 tablet (40 mg total) by mouth daily. For heartburn  . rosuvastatin (CRESTOR) 5 MG tablet Take 1 tablet (5 mg total) by mouth daily. (Needs to be seen before next refill)  . sildenafil (REVATIO) 20 MG tablet Take 2-5 pills at once, orally, with each sexual encounter  . traZODone (DESYREL) 150 MG tablet Use from 1/3 to 1 tablet nightly as needed for sleep.   No facility-administered encounter medications on file as of 11/28/2020.    Goals    .  Client will talk with LCSW in next 30 days to discuss pain issues of client and client completion of daily activities (pt-stated)      CARE PLAN ENTRY   Current Barriers:  . Pain issues(pain in right leg) of client with chronic diagnoses of OA, HLD, Prostate Cancer, HTN, DDD, Anemia . Sleep issues   Clinical Social Work Clinical Goal(s):  Marland Kitchen LCSW will call client in next 30 days to discuss client pain issues and client completion  of ADLs  Interventions: . Talked with client about CCM program . Talked with client about client appetite . Talked with client about social support network (has support from his spouse and brother) . Talked with Joshua Compton about his upcoming medical appointments . Talked with Joshua Compton about DME in his home (have a walker and a cane) . Encouraged Joshua Compton to talk with [facility] about nursing needs of client . Talked with Joshua Compton about ambulation needs of client . Talked with client about medication procurement (receives  prescribed  medications  through [facility] in [place], [place]) . Talked with client about upcoming client appointment with orthopedist next Tuesday in [place], [place] . Talked with client about pain issues of client (client spoke of pain in his left hip) . Talked with client about relaxation techniques (likes to golf and likes to fish)  Patient Self Care Activities:  Does ADLs independently Takes medications as prescribed  Patient Self Care Deficits:  . Pain issues . Sleeping issues  Initial goal documentation     Follow Up Plan: LCSW to call client/spouse of client in next 4 weeks to talk with client/spouse  about pain management challenges of client and to talk about client's completion of ADLs.  Joshua Compton.Joshua Compton MSW, LCSW Licensed Clinical Social Worker [facility] [phone]

## 2020-11-28 NOTE — Patient Instructions (Addendum)
Licensed Clinical Education officer, museum Visit Information  Goals we discussed today:     Client will talk with LCSW in next 30 days to discuss pain issues of client and client completion of daily activities (pt-stated)        CARE PLAN ENTRY   Current Barriers:   Pain issues(pain in right leg) of client with chronic diagnoses of OA, HLD, Prostate Cancer, HTN, DDD, Anemia  Sleep issues   Clinical Social Work Clinical Goal(s):   LCSW will call client in next 30 days to discuss client pain issues and client completion of ADLs  Interventions:  Talked with client about CCM program  Talked with client about client appetite  Talked with client about social support network (has support from his spouse and brother)  Talked with [name] about his upcoming medical appointments  Talked with [name] about DME in his home (have a walker and a cane)  Encouraged [name] to talk with RNCM about nursing needs of client  Talked with [name] about ambulation needs of client  Talked with client about medication procurement (receives  prescribed medications  through [facility] in [place], [place])  Talked with client about upcoming client appointment with orthopedist next Tuesday in [place], [place]  Talked with client about pain issues of client (client spoke of pain in his left hip)  Talked with client about relaxation techniques (likes to golf and likes to fish)  Patient Self Care Activities:  Does ADLs independently Takes medications as prescribed  Patient Self Care Deficits:   Pain issues  Sleeping issues  Initial goal documentation     Follow Up Plan:LCSW to call client/spouse of clientin next 4 weeks to talk with client/spouseabout pain management challenges of client and to talk about client'scompletion of ADLs.  The patient verbalized understanding of instructions provided today and declined a print copy of patient instruction materials.   [name].Sindee Stucker MSW,  LCSW Licensed Clinical Social Worker [facility] [phone]

## 2020-11-29 ENCOUNTER — Ambulatory Visit: Payer: Medicare Other | Admitting: Family Medicine

## 2020-11-29 ENCOUNTER — Other Ambulatory Visit: Payer: Self-pay

## 2020-11-29 ENCOUNTER — Encounter: Payer: Self-pay | Admitting: Family Medicine

## 2020-11-29 DIAGNOSIS — I10 Essential (primary) hypertension: Secondary | ICD-10-CM

## 2020-11-29 DIAGNOSIS — K219 Gastro-esophageal reflux disease without esophagitis: Secondary | ICD-10-CM

## 2020-11-29 MED ORDER — ENALAPRIL-HYDROCHLOROTHIAZIDE 5-12.5 MG PO TABS: 1.0000 | ORAL_TABLET | Freq: Every day | ORAL | 1 refills | Status: DC

## 2020-11-29 MED ORDER — PANTOPRAZOLE SODIUM 40 MG PO TBEC: 40.0000 mg | DELAYED_RELEASE_TABLET | Freq: Every day | ORAL | 1 refills | Status: DC

## 2020-11-29 MED ORDER — ROSUVASTATIN CALCIUM 5 MG PO TABS: 5.0000 mg | ORAL_TABLET | Freq: Every day | ORAL | 1 refills | Status: DC

## 2020-11-29 MED ORDER — FELODIPINE ER 5 MG PO TB24: 5.0000 mg | ORAL_TABLET | Freq: Every day | ORAL | 1 refills | Status: DC

## 2020-12-04 ENCOUNTER — Ambulatory Visit: Payer: Medicare Other | Admitting: Orthopaedic Surgery

## 2020-12-04 ENCOUNTER — Ambulatory Visit (INDEPENDENT_AMBULATORY_CARE_PROVIDER_SITE_OTHER): Payer: Medicare HMO

## 2020-12-04 DIAGNOSIS — M1612 Unilateral primary osteoarthritis, left hip: Secondary | ICD-10-CM | POA: Diagnosis not present

## 2020-12-04 DIAGNOSIS — Z96641 Presence of right artificial hip joint: Secondary | ICD-10-CM | POA: Diagnosis not present

## 2020-12-04 MED ORDER — AMOXICILLIN 500 MG PO CAPS: 2000.0000 mg | ORAL_CAPSULE | Freq: Once | ORAL | 6 refills | Status: AC

## 2020-12-04 NOTE — Progress Notes (Signed)
Office Visit Note   Patient: Joshua Compton.           Date of Birth: [birth year]           MRN: [id number] Visit Date: 12/04/2020              Requested by: [name], MD [place],  [place] [address] PCP: [name], MD   Assessment & Plan: Visit Diagnoses:  1. History of total hip replacement, right   2. Primary osteoarthritis of left hip     Plan: Mr. Arizola is 7 months status post right total hip replacement and also has end-stage left hip DJD.  Right hip replacement is doing well.  Dental prophylaxis reinforced.  No issues with that.  We will see him back at the 1 year mark.  For the left hip he is at a point where conservative measures have not provided him with any significant relief and wishes to proceed with a left total hip replacement in the near future.  He does not feel comfortable going home the same day therefore we will wait until the [place] restrictions are lifted by the hospital before we schedule his surgery so that he can spend the night for pain control and PT mobilization.  Questions encouraged and answered.  Follow-Up Instructions: Return if symptoms worsen or fail to improve.   Orders:  Orders Placed This Encounter  Procedures  . XR HIP UNILAT W OR W/O PELVIS 2-3 VIEWS RIGHT   Meds ordered this encounter  Medications  . amoxicillin (AMOXIL) 500 MG capsule    Sig: Take 4 capsules (2,000 mg total) by mouth once for 1 dose.    Dispense:  4 capsule    Refill:  6      Procedures: No procedures performed   Clinical Data: No additional findings.   Subjective: Chief Complaint  Patient presents with  . Right Hip - Follow-up    Joshua Compton is a 80 year old gentleman who is approximately 7 months status post right total hip replacement.  He is doing well overall.  No real complaints other than some soreness on the lateral hip.  His left hip is causing him severe pain and discomfort and difficulty with ADLs.  This pain is exactly  reminiscent of the right hip and groin pain.  The left hip pain does not respond well to medications and injections only provide temporary relief.   Review of Systems  Constitutional: Negative.   All other systems reviewed and are negative.    Objective: Vital Signs: There were no vitals taken for this visit.  Physical Exam Vitals and nursing note reviewed.  Constitutional:      Appearance: He is well-developed and well-nourished.  Pulmonary:     Effort: Pulmonary effort is normal.  Abdominal:     Palpations: Abdomen is soft.  Skin:    General: Skin is warm.  Neurological:     Mental Status: He is alert and oriented to person, place, and time.  Psychiatric:        Mood and Affect: Mood and affect normal.        Behavior: Behavior normal.        Thought Content: Thought content normal.        Judgment: Judgment normal.     Ortho Exam Right hip shows good range of motion without pain.  Surgical scars fully healed.  Lateral hip is mildly tender to palpation.  Left hip shows severe pain with attempted range  of motion.  He essentially has no internal or external rotation of the left hip.  Antalgic gait.  Specialty Comments:  No specialty comments available.  Imaging: XR HIP UNILAT W OR W/O PELVIS 2-3 VIEWS RIGHT  Result Date: 12/04/2020 Stable right total hip replacement without complication.  Advanced left hip degenerative joint disease    PMFS History: Patient Active Problem List   Diagnosis Date Noted  . Primary osteoarthritis of right hip 04/04/2020  . Thyromegaly 05/19/2017  . Neoplasm of scalp 12/08/2013  . Rhinitis 12/08/2013  . Cough 12/08/2013  . Hyperlipidemia   . Cancer ([place])   . Arthritis   . Severe stage glaucoma 03/26/2012  . Nuclear sclerosis 03/26/2012  . Primary open angle glaucoma 10/27/2011  . Essential hypertension 02/04/2011  . Degenerative disc disease 02/04/2011  . Gastrointestinal bleed 02/04/2011  . Anemia 02/04/2011  . Prostate  cancer ([place]) 02/04/2011   Past Medical History:  Diagnosis Date  . Arthritis   . Cancer [facility])    prostate ca   seed implants  . GERD (gastroesophageal reflux disease)   . Glaucoma   . Hyperlipidemia   . Hypertension    dr Jacelyn Grip     Willeen Niece   fm    Family History  Problem Relation Age of Onset  . Cancer Mother        breast  . Heart disease Father   . Cancer Brother   . Cancer Brother     Past Surgical History:  Procedure Laterality Date  . BACK SURGERY     x2  . COLONOSCOPY    . HERNIA REPAIR  11-06-2009   left inguinal repair   . NECK SURGERY    . ROTATOR CUFF REPAIR Left jan 2016  . TOTAL HIP ARTHROPLASTY Right 04/20/2020   Procedure: RIGHT TOTAL HIP ARTHROPLASTY ANTERIOR APPROACH;  Surgeon: Leandrew Koyanagi, MD;  Location: [facility];  Service: Orthopedics;  Laterality: Right;  . TRANSURETHRAL RESECTION OF PROSTATE     Social History   Occupational History  . Occupation: Artist: retired  Tobacco Use  . Smoking status: Former Smoker    Packs/day: 0.50    Years: 30.00    Pack years: 15.00    Types: Cigarettes    Quit date: 03/28/2005    Years since quitting: 15.6  . Smokeless tobacco: Never Used  Vaping Use  . Vaping Use: Never used  Substance and Sexual Activity  . Alcohol use: No    Alcohol/week: 0.0 standard drinks  . Drug use: No  . Sexual activity: Yes

## 2020-12-11 ENCOUNTER — Encounter: Payer: Self-pay | Admitting: Family Medicine

## 2020-12-11 ENCOUNTER — Ambulatory Visit (INDEPENDENT_AMBULATORY_CARE_PROVIDER_SITE_OTHER): Payer: Medicare HMO | Admitting: Family Medicine

## 2020-12-11 ENCOUNTER — Other Ambulatory Visit: Payer: Self-pay

## 2020-12-11 ENCOUNTER — Telehealth: Payer: Self-pay

## 2020-12-11 VITALS — BP 123/63 | HR 74 | Temp 97.6°F | Ht 72.0 in | Wt 208.0 lb

## 2020-12-11 DIAGNOSIS — M1990 Unspecified osteoarthritis, unspecified site: Secondary | ICD-10-CM | POA: Diagnosis not present

## 2020-12-11 DIAGNOSIS — E782 Mixed hyperlipidemia: Secondary | ICD-10-CM

## 2020-12-11 DIAGNOSIS — C61 Malignant neoplasm of prostate: Secondary | ICD-10-CM | POA: Diagnosis not present

## 2020-12-11 DIAGNOSIS — R252 Cramp and spasm: Secondary | ICD-10-CM | POA: Insufficient documentation

## 2020-12-11 DIAGNOSIS — I10 Essential (primary) hypertension: Secondary | ICD-10-CM

## 2020-12-11 MED ORDER — METHOCARBAMOL 750 MG PO TABS: 750.0000 mg | ORAL_TABLET | Freq: Two times a day (BID) | ORAL | 3 refills | Status: DC | PRN

## 2020-12-11 MED ORDER — MIRTAZAPINE 15 MG PO TABS: 15.0000 mg | ORAL_TABLET | Freq: Every day | ORAL | 5 refills | Status: DC

## 2020-12-11 MED ORDER — TADALAFIL 20 MG PO TABS: 10.0000 mg | ORAL_TABLET | ORAL | 11 refills | Status: DC | PRN

## 2020-12-11 NOTE — Progress Notes (Signed)
Subjective:  Patient ID: Joshua Guiles., male    DOB: 02/01/1941  Age: 80 y.o. MRN: [id number]  CC: Medical Management of Chronic Issues   HPI Joshua Werber. presents for  presents for  follow-up of hypertension. Patient has no history of headache chest pain or shortness of breath or recent cough. Patient also denies symptoms of TIA such as focal numbness or weakness. Patient denies side effects from medication. States taking it regularly.  Patient says that he is not sleeping well the trazodone has not helped at all.  He took it for 3 nights and it made him feel bad so he discontinued it.  Now he is sleeping off and on with several awakenings per night.  He states that the Revatio did not help with his erectile dysfunction.  He is wondering if there is something else he could use.  Patient in for follow-up of GERD. Currently asymptomatic taking  PPI daily. There is no chest pain or heartburn. No hematemesis and no melena. No dysphagia or choking. Onset is remote. Progression is stable. Complicating factors, none.     Depression screen [facility] 2/9 12/11/2020 04/18/2020 03/28/2020  Decreased Interest 0 0 1  Down, Depressed, Hopeless 0 0 0  PHQ - 2 Score 0 0 1  Altered sleeping - - 2  Tired, decreased energy - - 1  Change in appetite - - 0  Feeling bad or failure about yourself  - - 0  Trouble concentrating - - 0  Moving slowly or fidgety/restless - - 1  Suicidal thoughts - - 0  PHQ-9 Score - - 5  Difficult doing work/chores - - Somewhat difficult    History Joshua Compton has a past medical history of Arthritis, Cancer ([place]), GERD (gastroesophageal reflux disease), Glaucoma, Hyperlipidemia, and Hypertension.   He has a past surgical history that includes Back surgery; Neck surgery; Transurethral resection of prostate; Hernia repair (11-06-2009); Rotator cuff repair (Left, jan 2016); Colonoscopy; and Total hip arthroplasty (Right, 04/20/2020).   His family history includes Cancer  in his brother, brother, and mother; Heart disease in his father.He reports that he quit smoking about 15 years ago. His smoking use included cigarettes. He has a 15.00 pack-year smoking history. He has never used smokeless tobacco. He reports that he does not drink alcohol and does not use drugs.    ROS Review of Systems  Constitutional: Negative for fever.  Respiratory: Negative for shortness of breath.   Cardiovascular: Negative for chest pain.  Musculoskeletal: Positive for arthralgias and myalgias (due for refill of muscle relaxer for leg cramps).  Skin: Negative for rash.    Objective:  BP 123/63   Pulse 74   Temp 97.6 F (36.4 C) (Temporal)   Ht 6' (1.829 m)   Wt 208 lb (94.3 kg)   BMI 28.21 kg/m   BP Readings from Last 3 Encounters:  12/11/20 123/63  05/31/20 132/75  04/21/20 136/86    Wt Readings from Last 3 Encounters:  12/11/20 208 lb (94.3 kg)  07/17/20 201 lb (91.2 kg)  06/01/20 201 lb (91.2 kg)     Physical Exam Vitals reviewed.  Constitutional:      Appearance: He is well-developed and well-nourished.  HENT:     Head: Normocephalic and atraumatic.     Right Ear: Tympanic membrane and external ear normal. No decreased hearing noted.     Left Ear: Tympanic membrane and external ear normal. No decreased hearing noted.     Mouth/Throat:  Pharynx: No oropharyngeal exudate or posterior oropharyngeal erythema.  Eyes:     Pupils: Pupils are equal, round, and reactive to light.  Cardiovascular:     Rate and Rhythm: Normal rate and regular rhythm.     Heart sounds: No murmur heard.   Pulmonary:     Effort: No respiratory distress.     Breath sounds: Normal breath sounds.  Abdominal:     General: Bowel sounds are normal.     Palpations: Abdomen is soft. There is no mass.     Tenderness: There is no abdominal tenderness.  Musculoskeletal:     Cervical back: Normal range of motion and neck supple.       Assessment & Plan:   Joshua Compton was seen  today for medical management of chronic issues.  Diagnoses and all orders for this visit:  Essential hypertension -     CBC with Differential/Platelet -     CMP14+EGFR  Mixed hyperlipidemia -     CBC with Differential/Platelet -     CMP14+EGFR -     Lipid panel  Leg cramps -     CBC with Differential/Platelet -     CMP14+EGFR  Other orders -     methocarbamol (ROBAXIN) 750 MG tablet; Take 1 tablet (750 mg total) by mouth 2 (two) times daily as needed (leg cramps). -     mirtazapine (REMERON) 15 MG tablet; Take 1 tablet (15 mg total) by mouth at bedtime. For sleep -     tadalafil (CIALIS) 20 MG tablet; Take 0.5-1 tablets (10-20 mg total) by mouth every other day as needed for erectile dysfunction.       I have discontinued Joshua Gula Jr.'s aspirin EC, ondansetron, oxyCODONE, gabapentin, aspirin EC, sildenafil, traZODone, and rosuvastatin. I have also changed his methocarbamol. Additionally, I am having him start on mirtazapine and tadalafil. Lastly, I am having him maintain his dorzolamide-timolol, fluticasone, pantoprazole, felodipine, and Enalapril-hydroCHLOROthiazide.  Allergies as of 12/11/2020      Reactions   Pravastatin Sodium    Muscle aches      Medication List       Accurate as of December 11, 2020 10:12 PM. If you have any questions, ask your nurse or doctor.        STOP taking these medications   aspirin EC 81 MG tablet Stopped by: [name], MD   gabapentin 100 MG capsule Commonly known as: NEURONTIN Stopped by: [name], MD   ondansetron 4 MG tablet Commonly known as: ZOFRAN Stopped by: [name], MD   oxyCODONE 5 MG immediate release tablet Commonly known as: Oxy IR/ROXICODONE Stopped by: [name], MD   rosuvastatin 5 MG tablet Commonly known as: CRESTOR Stopped by: [name], MD   sildenafil 20 MG tablet Commonly known as: REVATIO Stopped by: [name], MD   traZODone 150 MG tablet Commonly known as:  DESYREL Stopped by: [name], MD     TAKE these medications   dorzolamide-timolol 22.3-6.8 MG/ML ophthalmic solution Commonly known as: COSOPT Place 1 drop into both eyes daily.   Enalapril-hydroCHLOROthiazide 5-12.5 MG tablet Take 1 tablet by mouth daily.   felodipine 5 MG 24 hr tablet Commonly known as: PLENDIL Take 1 tablet (5 mg total) by mouth daily. For blood pressure   fluticasone 50 MCG/ACT nasal spray Commonly known as: FLONASE Place 1 spray into both nostrils 2 (two) times daily as needed for allergies or rhinitis.   methocarbamol 750 MG tablet Commonly known as: ROBAXIN Take 1 tablet (  750 mg total) by mouth 2 (two) times daily as needed (leg cramps). What changed: reasons to take this Changed by: [name], MD   mirtazapine 15 MG tablet Commonly known as: Remeron Take 1 tablet (15 mg total) by mouth at bedtime. For sleep Started by: [name], MD   pantoprazole 40 MG tablet Commonly known as: PROTONIX Take 1 tablet (40 mg total) by mouth daily. For heartburn   tadalafil 20 MG tablet Commonly known as: CIALIS Take 0.5-1 tablets (10-20 mg total) by mouth every other day as needed for erectile dysfunction. Started by: [name], MD        Follow-up: Return in about 6 months (around 06/10/2021), or if symptoms worsen or fail to improve, for hypertension.  [name], M.D.

## 2020-12-12 LAB — CBC WITH DIFFERENTIAL/PLATELET
Basophils Absolute: 0.1 10*3/uL (ref 0.0–0.2)
Basos: 1 %
EOS (ABSOLUTE): 0.2 10*3/uL (ref 0.0–0.4)
Eos: 4 %
Hematocrit: 43.3 % (ref 37.5–51.0)
Hemoglobin: 14.8 g/dL (ref 13.0–17.7)
Immature Grans (Abs): 0 10*3/uL (ref 0.0–0.1)
Immature Granulocytes: 1 %
Lymphocytes Absolute: 2.1 10*3/uL (ref 0.7–3.1)
Lymphs: 31 %
MCH: 31.2 pg (ref 26.6–33.0)
MCHC: 34.2 g/dL (ref 31.5–35.7)
MCV: 91 fL (ref 79–97)
Monocytes Absolute: 0.5 10*3/uL (ref 0.1–0.9)
Monocytes: 7 %
Neutrophils Absolute: 3.7 10*3/uL (ref 1.4–7.0)
Neutrophils: 56 %
Platelets: 150 10*3/uL (ref 150–450)
RBC: 4.74 x10E6/uL (ref 4.14–5.80)
RDW: 13.1 % (ref 11.6–15.4)
WBC: 6.6 10*3/uL (ref 3.4–10.8)

## 2020-12-12 LAB — CMP14+EGFR
ALT: 22 IU/L (ref 0–44)
AST: 23 IU/L (ref 0–40)
Albumin/Globulin Ratio: 1.7 (ref 1.2–2.2)
Albumin: 4.7 g/dL (ref 3.7–4.7)
Alkaline Phosphatase: 113 IU/L (ref 44–121)
BUN/Creatinine Ratio: 15 (ref 10–24)
BUN: 17 mg/dL (ref 8–27)
Bilirubin Total: 0.5 mg/dL (ref 0.0–1.2)
CO2: 26 mmol/L (ref 20–29)
Calcium: 10.1 mg/dL (ref 8.6–10.2)
Chloride: 102 mmol/L (ref 96–106)
Creatinine, Ser: 1.15 mg/dL (ref 0.76–1.27)
GFR calc Af Amer: 70 mL/min/{1.73_m2} (ref >59)
GFR calc non Af Amer: 60 mL/min/{1.73_m2} (ref >59)
Globulin, Total: 2.8 g/dL (ref 1.5–4.5)
Glucose: 99 mg/dL (ref 65–99)
Potassium: 3.8 mmol/L (ref 3.5–5.2)
Sodium: 143 mmol/L (ref 134–144)
Total Protein: 7.5 g/dL (ref 6.0–8.5)

## 2020-12-12 LAB — LIPID PANEL
Chol/HDL Ratio: 2.9 ratio (ref 0.0–5.0)
Cholesterol, Total: 152 mg/dL (ref 100–199)
HDL: 53 mg/dL (ref >39)
LDL Chol Calc (NIH): 76 mg/dL (ref 0–99)
Triglycerides: 130 mg/dL (ref 0–149)
VLDL Cholesterol Cal: 23 mg/dL (ref 5–40)

## 2020-12-12 NOTE — Telephone Encounter (Signed)
Medication added to medication list.

## 2020-12-13 ENCOUNTER — Telehealth: Payer: Self-pay | Admitting: *Deleted

## 2020-12-13 ENCOUNTER — Telehealth: Payer: Self-pay | Admitting: Orthopaedic Surgery

## 2020-12-13 NOTE — Telephone Encounter (Signed)
Error

## 2020-12-13 NOTE — Telephone Encounter (Signed)
Fax came in today for pt - he has a RX for Methocarbamol Tab 750 mg  This med is NOT on formulary and will not be covered any longer.  They did approve this recent 12/11/20 #30 day, but will not cover after.    Dx. Leg cramps, bilateral hip pain  Will attempt PA for this med.   Key: [id number]  Drug Methocarbamol 750MG  tablets Sent to Plan today

## 2020-12-13 NOTE — Progress Notes (Signed)
Hello Joshua Compton,  Your lab result is normal and/or stable.Some minor variations that are not significant are commonly marked abnormal, but do not represent any medical problem for you.  Best regards, Allan Minotti, M.D.

## 2020-12-14 ENCOUNTER — Telehealth: Payer: Self-pay

## 2020-12-14 ENCOUNTER — Other Ambulatory Visit: Payer: Self-pay

## 2020-12-14 NOTE — Telephone Encounter (Signed)
Patient states that he does not methocarbamol at this time

## 2020-12-14 NOTE — Telephone Encounter (Signed)
Denied on January 27 Your request has been denied  We denied this request under Medicare Part D because: The requested drug is not on your plan's formulary  (list of covered drugs). Your Medicare Part D drug plan was asked to cover a drug that is not on the formulary  (this is called a formulary exception)   He will be UNABLE to pick up remaining refills for this medication.

## 2020-12-14 NOTE — Telephone Encounter (Signed)
Medication list updated.  Patient states that he does not take methocarbamol and does not need this type of medication at this time

## 2020-12-14 NOTE — Telephone Encounter (Signed)
Thank you, Wilfred Curtis!

## 2020-12-14 NOTE — Telephone Encounter (Signed)
Patient states that he is not taking methocarbamol and does not need this type of medication.  Patient gave updated medication list

## 2020-12-14 NOTE — Telephone Encounter (Signed)
If pt. Is willing, and tey are on formulary, I could sent in flexeril or tizanidine or metaxalone. All are as good or better.

## 2020-12-14 NOTE — Telephone Encounter (Signed)
Please clarify

## 2020-12-17 ENCOUNTER — Telehealth: Payer: Self-pay | Admitting: Orthopaedic Surgery

## 2020-12-17 LAB — PSA, TOTAL AND FREE
PSA, Free: <0.01 ng/mL
Prostate Specific Ag, Serum: <0.1 ng/mL (ref 0.0–4.0)

## 2020-12-17 LAB — SPECIMEN STATUS REPORT

## 2020-12-17 NOTE — Telephone Encounter (Signed)
Pt called stating he has a dentist appt on 12/18/20 @ 2 and he will need antibiotics called in. Pt would like a CB to be advise how he should take the antibiotic with all his other rx  [phone]

## 2020-12-18 ENCOUNTER — Other Ambulatory Visit: Payer: Self-pay | Admitting: Physician Assistant

## 2020-12-18 MED ORDER — AMOXICILLIN 500 MG PO CAPS: ORAL_CAPSULE | ORAL | 2 refills | Status: DC

## 2020-12-18 NOTE — Telephone Encounter (Signed)
Patient aware.

## 2020-12-18 NOTE — Telephone Encounter (Signed)
Sent in.  Take 4 pills one hour prior to dental work

## 2020-12-18 NOTE — Progress Notes (Signed)
Hello Daimen,  Your lab result is normal and/or stable.Some minor variations that are not significant are commonly marked abnormal, but do not represent any medical problem for you.  Best regards, Hartlyn Reigel, M.D.

## 2020-12-24 ENCOUNTER — Telehealth: Payer: Self-pay | Admitting: Orthopaedic Surgery

## 2020-12-24 ENCOUNTER — Other Ambulatory Visit: Payer: Self-pay | Admitting: Physician Assistant

## 2020-12-24 ENCOUNTER — Ambulatory Visit (INDEPENDENT_AMBULATORY_CARE_PROVIDER_SITE_OTHER): Payer: Medicare HMO

## 2020-12-24 DIAGNOSIS — Z0000 Encounter for general adult medical examination without abnormal findings: Secondary | ICD-10-CM | POA: Diagnosis not present

## 2020-12-24 MED ORDER — AMOXICILLIN 500 MG PO CAPS: ORAL_CAPSULE | ORAL | 2 refills | Status: DC

## 2020-12-24 NOTE — Patient Instructions (Signed)
  [place] Maintenance Summary and Written Plan of Care  Joshua Compton ,  Thank you for allowing me to perform your Medicare Annual Wellness Visit and for your ongoing commitment to your health.   Health Maintenance & Immunization History Health Maintenance  Topic Date Due  . COVID-19 Vaccine (3 - Moderna risk 4-dose series) 02/20/2020  . TETANUS/TDAP  07/18/2021  . INFLUENZA VACCINE  Completed  . Hepatitis C Screening  Completed  . PNA vac Low Risk Adult  Completed   Immunization History  Administered Date(s) Administered  . Fluad Quad(high Dose 65+) 08/12/2019, 09/04/2020  . Influenza, High Dose Seasonal PF 08/15/2014, 10/13/2016, 09/10/2017, 10/19/2018  . Influenza,inj,Quad PF,6+ Mos 09/19/2013  . Moderna Sars-Covid-2 Vaccination 12/22/2019, 01/23/2020  . Pneumococcal Conjugate-13 08/15/2014  . Pneumococcal Polysaccharide-23 07/19/2011  . Tdap 07/19/2011    These are the patient goals that we discussed: Goals Addressed            This Visit's Progress   . Exercise 150 min/wk Moderate Activity   Not on track    Left hip pain. Has upcoming surgery.    . Prevent falls   On track       This is a list of Health Maintenance Items that are overdue or due now: Health Maintenance Due  Topic Date Due  . COVID-19 Vaccine (3 - Moderna risk 4-dose series) 02/20/2020     Orders/Referrals Placed Today: No orders of the defined types were placed in this encounter.  (Contact our referral department at [phone] if you have not spoken with someone about your referral appointment within the next 5 days)    Follow-up Plan  Scheduled with Dr. Livia Snellen 06/11/2021 at 10:10am.

## 2020-12-24 NOTE — Telephone Encounter (Signed)
Pt called and need the medication refilled for his dental work

## 2020-12-24 NOTE — Progress Notes (Signed)
MEDICARE ANNUAL WELLNESS VISIT  12/24/2020  Telephone Visit Disclaimer This Medicare AWV was conducted by telephone due to national recommendations for restrictions regarding the COVID-19 Pandemic (e.g. social distancing).  I verified, using two identifiers, that I am speaking with Joshua Compton. or their authorized healthcare agent. I discussed the limitations, risks, security, and privacy concerns of performing an evaluation and management service by telephone and the potential availability of an in-person appointment in the future. The patient expressed understanding and agreed to proceed.  Location of Patient: Home  Location of Provider (nurse):  [facility]  Subjective:    Joshua Compton. is a 80 y.o. male patient of Stacks, Cletus Gash, MD who had a Medicare Annual Wellness Visit today via telephone. Karmel is Retired and lives with their spouse. he has five children. he reports that he is socially active and does interact with friends/family regularly. he is minimally physically active and enjoys golf and fishing.  Patient Care Team: [name], MD as PCP - General (Family Medicine) Juanita Craver, MD as Consulting Physician (Gastroenterology) Jovita Gamma, MD as Consulting Physician (Neurosurgery) Irine Seal, MD as Attending Physician (Urology) Monna Fam, MD as Consulting Physician (Ophthalmology) Irine Seal, MD as Attending Physician (Urology) Shea Evans [name], LCSW as [place] Management (Licensed Clinical Social Worker)  Advanced Directives 12/24/2020 04/17/2020 02/21/2020 01/29/2020 06/01/2018 03/29/2015 02/06/2015  Does Patient Have a Medical Advance Directive? No No No Yes No No No  Does patient want to make changes to medical advance directive? - - - No - Patient declined - - -  Would patient like information on creating a medical advance directive? No - Patient declined Yes (MAU/Ambulatory/Procedural Areas -  Information given) No - Patient declined - No - Patient declined Yes - Educational materials given -  Pre-existing out of facility DNR order (yellow form or pink MOST form) - - - - - - -    Hospital Utilization Over the Past 12 Months: # of hospitalizations or ER visits: 1 # of surgeries: 1  Review of Systems    Patient reports that his overall health is unchanged compared to last year.  Patient Reported Readings (BP, Pulse, CBG, Weight, etc) none  Pain Assessment Pain : 0-10 Pain Score: 5  Pain Location: Hip Pain Orientation: Left Pain Descriptors / Indicators: Discomfort Pain Onset: More than a month ago Pain Frequency: Intermittent Pain Relieving Factors: OTC Advil/Tylenol  Pain Relieving Factors: OTC Advil/Tylenol  Current Medications & Allergies (verified) Allergies as of 12/24/2020      Reactions   Pravastatin Sodium    Muscle aches      Medication List       Accurate as of December 24, 2020 11:00 AM. If you have any questions, ask your nurse or doctor.        amoxicillin 500 MG capsule Commonly known as: AMOXIL Take 4 pills one hour to dental procedure   dorzolamide-timolol 22.3-6.8 MG/ML ophthalmic solution Commonly known as: COSOPT Place 1 drop into both eyes daily.   Enalapril-hydroCHLOROthiazide 5-12.5 MG tablet Take 1 tablet by mouth daily.   felodipine 5 MG 24 hr tablet Commonly known as: PLENDIL Take 1 tablet (5 mg total) by mouth daily. For blood pressure   mirtazapine 15 MG tablet Commonly known as: Remeron Take 1 tablet (15 mg total) by mouth at bedtime. For sleep   pantoprazole 40 MG tablet Commonly known as: PROTONIX Take 1 tablet (40 mg total) by mouth daily. For heartburn  rOPINIRole 1 MG tablet Commonly known as: REQUIP Take 1 mg by mouth 2 (two) times daily.   rosuvastatin 5 MG tablet Commonly known as: CRESTOR Take 5 mg by mouth daily.       History (reviewed): Past Medical History:  Diagnosis Date  . Arthritis   .  Cancer [facility])    prostate ca   seed implants  . GERD (gastroesophageal reflux disease)   . Glaucoma   . Hyperlipidemia   . Hypertension    dr Jacelyn Grip     Willeen Niece   fm   Past Surgical History:  Procedure Laterality Date  . BACK SURGERY     x2  . COLONOSCOPY    . HERNIA REPAIR  11-06-2009   left inguinal repair   . NECK SURGERY    . ROTATOR CUFF REPAIR Left jan 2016  . TOTAL HIP ARTHROPLASTY Right 04/20/2020   Procedure: RIGHT TOTAL HIP ARTHROPLASTY ANTERIOR APPROACH;  Surgeon: Leandrew Koyanagi, MD;  Location: [facility];  Service: Orthopedics;  Laterality: Right;  . TRANSURETHRAL RESECTION OF PROSTATE     Family History  Problem Relation Age of Onset  . Cancer Mother        breast  . Heart disease Father   . Cancer Brother   . Cancer Brother    Social History   Socioeconomic History  . Marital status: Married    Spouse name: Not on file  . Number of children: 5  . Years of education: 40  . Highest education level: 11th grade  Occupational History  . Occupation: Artist: retired  Tobacco Use  . Smoking status: Former Smoker    Packs/day: 0.50    Years: 30.00    Pack years: 15.00    Types: Cigarettes    Quit date: 03/28/2005    Years since quitting: 15.7  . Smokeless tobacco: Never Used  Vaping Use  . Vaping Use: Never used  Substance and Sexual Activity  . Alcohol use: No    Alcohol/week: 0.0 standard drinks  . Drug use: No  . Sexual activity: Yes  Other Topics Concern  . Not on file  Social History Narrative   ** Merged History Encounter **       Social Determinants of Health   Financial Resource Strain: Not on file  Food Insecurity: Not on file  Transportation Needs: Not on file  Physical Activity: Not on file  Stress: Not on file  Social Connections: Not on file    Activities of Daily Living In your present state of health, do you have any difficulty performing the following activities: 12/24/2020 04/17/2020  Hearing? N N  Vision? N N   Difficulty concentrating or making decisions? N N  Walking or climbing stairs? N Y  Comment - due to pain  Dressing or bathing? N N  Doing errands, shopping? N N  Preparing Food and eating ? N -  Using the Toilet? N -  In the past six months, have you accidently leaked urine? N -  Do you have problems with loss of bowel control? N -  Managing your Medications? N -  Managing your Finances? N -  Housekeeping or managing your Housekeeping? N -  Some recent data might be hidden    Patient Education/ Literacy How often do you need to have someone help you when you read instructions, pamphlets, or other written materials from your doctor or pharmacy?: 2 - Rarely What is the last grade level you  completed in school?: 11th grade  Exercise Current Exercise Habits: Home exercise routine, Type of exercise: walking, Time (Minutes): 20, Frequency (Times/Week): 5, Weekly Exercise (Minutes/Week): 100, Intensity: Mild, Exercise limited by: orthopedic condition(s)  Diet Patient reports consuming 3 meals a day and 2 snack(s) a day Patient reports that his primary diet is: Regular Patient reports that she does have regular access to food.   Depression Screen PHQ 2/9 Scores 12/24/2020 12/11/2020 04/18/2020 03/28/2020 01/16/2020 12/16/2019 12/06/2018  PHQ - 2 Score 0 0 0 1 0 0 0  PHQ- 9 Score - - - 5 - - -     Fall Risk Fall Risk  12/11/2020 05/31/2020 04/18/2020 01/16/2020 12/16/2019  Falls in the past year? 0 0 0 0 0  Number falls in past yr: 0 - 0 0 0  Injury with Fall? 0 - 0 0 0  Risk for fall due to : No Fall Risks - No Fall Risks No Fall Risks No Fall Risks  Follow up Falls evaluation completed Falls evaluation completed Falls evaluation completed Falls evaluation completed -     Objective:  Bonifacio Pruden. seemed alert and oriented and he participated appropriately during our telephone visit.  Blood Pressure Weight BMI  BP Readings from Last 3 Encounters:  12/11/20 123/63  05/31/20 132/75   04/21/20 136/86   Wt Readings from Last 3 Encounters:  12/11/20 208 lb (94.3 kg)  07/17/20 201 lb (91.2 kg)  06/01/20 201 lb (91.2 kg)   BMI Readings from Last 1 Encounters:  12/11/20 28.21 kg/m    *Unable to obtain current vital signs, weight, and BMI due to telephone visit type  Hearing/Vision  . Dempsy did not seem to have difficulty with hearing/understanding during the telephone conversation . Reports that he has had a formal eye exam by an eye care professional within the past year . Reports that he has not had a formal hearing evaluation within the past year *Unable to fully assess hearing and vision during telephone visit type  Cognitive Function: 6CIT Screen 12/24/2020  What Year? 0 points  What month? 0 points  What time? 0 points  Count back from 20 0 points  Months in reverse 0 points  Repeat phrase 0 points  Total Score 0   (Normal:0-7, Significant for Dysfunction: >8)  Normal Cognitive Function Screening: Yes   Immunization & Health Maintenance Record Immunization History  Administered Date(s) Administered  . Fluad Quad(high Dose 65+) 08/12/2019, 09/04/2020  . Influenza, High Dose Seasonal PF 08/15/2014, 10/13/2016, 09/10/2017, 10/19/2018  . Influenza,inj,Quad PF,6+ Mos 09/19/2013  . Moderna Sars-Covid-2 Vaccination 12/22/2019, 01/23/2020  . Pneumococcal Conjugate-13 08/15/2014  . Pneumococcal Polysaccharide-23 07/19/2011  . Tdap 07/19/2011    Health Maintenance  Topic Date Due  . COVID-19 Vaccine (3 - Moderna risk 4-dose series) 02/20/2020  . TETANUS/TDAP  07/18/2021  . INFLUENZA VACCINE  Completed  . Hepatitis C Screening  Completed  . PNA vac Low Risk Adult  Completed       Assessment  This is a routine wellness examination for [employer].Marland Kitchen  Health Maintenance: Due or Overdue Health Maintenance Due  Topic Date Due  . COVID-19 Vaccine (3 - Moderna risk 4-dose series) 02/20/2020    Joshua Compton. does not need a  referral for Community Assistance: Care Management:   no Social Work:    no Prescription Assistance:  no Nutrition/Diabetes Education:  no   Plan:  Personalized Goals Goals Addressed  This Visit's Progress   . Exercise 150 min/wk Moderate Activity   Not on track    Left hip pain. Has upcoming surgery.    . Prevent falls   On track     Personalized Health Maintenance & Screening Recommendations   Covid Vaccination  Lung Cancer Screening Recommended: no (Low Dose CT Chest recommended if Age 86-80 years, 30 pack-year currently smoking OR have quit w/in past 15 years) Hepatitis C Screening recommended: no HIV Screening recommended: no  Advanced Directives: Written information was not prepared per patient's request.  Referrals & Orders No orders of the defined types were placed in this encounter.   Follow-up Plan . Follow-up with [name], MD as planned . Schedule 06/11/2021    I have personally reviewed and noted the following in the patient's chart:   . Medical and social history . Use of alcohol, tobacco or illicit drugs  . Current medications and supplements . Functional ability and status . Nutritional status . Physical activity . Advanced directives . List of other physicians . Hospitalizations, surgeries, and ER visits in previous 12 months . Vitals . Screenings to include cognitive, depression, and falls . Referrals and appointments  In addition, I have reviewed and discussed with Joshua Compton. certain preventive protocols, quality metrics, and best practice recommendations. A written personalized care plan for preventive services as well as general preventive health recommendations is available and can be mailed to the patient at his request.      Maud Deed [facility]  03/22/3747

## 2020-12-24 NOTE — Telephone Encounter (Signed)
Sent in

## 2020-12-24 NOTE — Telephone Encounter (Signed)
Patient aware.

## 2021-01-01 ENCOUNTER — Telehealth: Payer: Medicare HMO

## 2021-01-02 NOTE — Progress Notes (Signed)
Surgical Instructions    Your procedure is scheduled on 01/07/21.  Report to [facility] Main Entrance "A" at 07:30 A.M., then check in with the Admitting office.  Call this number if you have problems the morning of surgery:  [phone]   If you have any questions prior to your surgery date call [phone]: Open Monday-Friday 8am-4pm    Remember:  Do not eat after midnight the night before your surgery  You may drink clear liquids until 06:30am the morning of your surgery.   Clear liquids allowed are: Water, Non-Citrus Juices (without pulp), Carbonated Beverages, Clear Tea, Black Coffee Only, and Gatorade    Take these medicines the morning of surgery with A SIP OF WATER  acetaminophen (TYLENOL) dorzolamide-timolol (COSOPT) eye drops felodipine (PLENDIL) pantoprazole (PROTONIX)  rOPINIRole (REQUIP) rosuvastatin (CRESTOR)   As of today, STOP taking any Aspirin (unless otherwise instructed by your surgeon) Aleve, Naproxen, Ibuprofen, Motrin, Advil, Goody's, BC's, all herbal medications, fish oil, and all vitamins.                     Do not wear jewelry, make up, or nail polish            Do not wear lotions, powders, perfumes/colognes, or deodorant.            Men may shave face and neck.            Do not bring valuables to the hospital.            [facility] is not responsible for any belongings or valuables.  Do NOT Smoke (Tobacco/Vaping) or drink Alcohol 24 hours prior to your procedure If you use a CPAP at night, you may bring all equipment for your overnight stay.   Contacts, glasses, dentures or bridgework may not be worn into surgery, please bring cases for these belongings   For patients admitted to the hospital, discharge time will be determined by your treatment team.   Patients discharged the day of surgery will not be allowed to drive home, and someone needs to stay with them for 24 hours.    Special instructions:   [place]- Preparing For  Surgery  Before surgery, you can play an important role. Because skin is not sterile, your skin needs to be as free of germs as possible. You can reduce the number of germs on your skin by washing with CHG (chlorahexidine gluconate) Soap before surgery.  CHG is an antiseptic cleaner which kills germs and bonds with the skin to continue killing germs even after washing.    Oral Hygiene is also important to reduce your risk of infection.  Remember - BRUSH YOUR TEETH THE MORNING OF SURGERY WITH YOUR REGULAR TOOTHPASTE  Please do not use if you have an allergy to CHG or antibacterial soaps. If your skin becomes reddened/irritated stop using the CHG.  Do not shave (including legs and underarms) for at least 48 hours prior to first CHG shower. It is OK to shave your face.  Please follow these instructions carefully.   1. Shower the NIGHT BEFORE SURGERY and the MORNING OF SURGERY  2. If you chose to wash your hair, wash your hair first as usual with your normal shampoo.  3. After you shampoo, rinse your hair and body thoroughly to remove the shampoo.  4. Wash Face and genitals (private parts) with your normal soap.   5.  Shower the NIGHT BEFORE SURGERY and the MORNING OF SURGERY with CHG  Soap.   6. Use CHG Soap as you would any other liquid soap. You can apply CHG directly to the skin and wash gently with a scrungie or a clean washcloth.   7. Apply the CHG Soap to your body ONLY FROM THE NECK DOWN.  Do not use on open wounds or open sores. Avoid contact with your eyes, ears, mouth and genitals (private parts). Wash Face and genitals (private parts)  with your normal soap.   8. Wash thoroughly, paying special attention to the area where your surgery will be performed.  9. Thoroughly rinse your body with warm water from the neck down.  10. DO NOT shower/wash with your normal soap after using and rinsing off the CHG Soap.  11. Pat yourself dry with a CLEAN TOWEL.  12. Wear CLEAN PAJAMAS to bed  the night before surgery  13. Place CLEAN SHEETS on your bed the night before your surgery  14. DO NOT SLEEP WITH PETS.   Day of Surgery: Take a shower.  Wear Clean/Comfortable clothing the morning of surgery Do not apply any deodorants/lotions.   Remember to brush your teeth WITH YOUR REGULAR TOOTHPASTE.   Please read over the following fact sheets that you were given.

## 2021-01-03 ENCOUNTER — Encounter (HOSPITAL_COMMUNITY)
Admission: RE | Admit: 2021-01-03 | Discharge: 2021-01-03 | Disposition: A | Discharge status: Home / Self Care | Payer: Medicare HMO | Source: Ambulatory Visit | Attending: Orthopaedic Surgery | Admitting: Orthopaedic Surgery

## 2021-01-03 ENCOUNTER — Other Ambulatory Visit (HOSPITAL_COMMUNITY): Payer: Medicare HMO

## 2021-01-03 ENCOUNTER — Other Ambulatory Visit: Payer: Self-pay | Admitting: Physician Assistant

## 2021-01-03 ENCOUNTER — Encounter (HOSPITAL_COMMUNITY): Payer: Self-pay

## 2021-01-03 ENCOUNTER — Other Ambulatory Visit: Payer: Self-pay

## 2021-01-03 ENCOUNTER — Ambulatory Visit (HOSPITAL_COMMUNITY)
Admission: RE | Admit: 2021-01-03 | Discharge: 2021-01-03 | Disposition: A | Discharge status: Home / Self Care | Payer: Medicare HMO | Source: Ambulatory Visit | Attending: Physician Assistant | Admitting: Physician Assistant

## 2021-01-03 DIAGNOSIS — M1612 Unilateral primary osteoarthritis, left hip: Secondary | ICD-10-CM

## 2021-01-03 DIAGNOSIS — I700 Atherosclerosis of aorta: Secondary | ICD-10-CM | POA: Diagnosis not present

## 2021-01-03 DIAGNOSIS — Z01818 Encounter for other preprocedural examination: Secondary | ICD-10-CM | POA: Insufficient documentation

## 2021-01-03 LAB — CBC WITH DIFFERENTIAL/PLATELET
Abs Immature Granulocytes: 0.02 10*3/uL (ref 0.00–0.07)
Basophils Absolute: 0.1 10*3/uL (ref 0.0–0.1)
Basophils Relative: 1 %
Eosinophils Absolute: 0.2 10*3/uL (ref 0.0–0.5)
Eosinophils Relative: 4 %
HCT: 46.8 % (ref 39.0–52.0)
Hemoglobin: 14.7 g/dL (ref 13.0–17.0)
Immature Granulocytes: 0 %
Lymphocytes Relative: 37 %
Lymphs Abs: 2.2 10*3/uL (ref 0.7–4.0)
MCH: 30.1 pg (ref 26.0–34.0)
MCHC: 31.4 g/dL (ref 30.0–36.0)
MCV: 95.9 fL (ref 80.0–100.0)
Monocytes Absolute: 0.5 10*3/uL (ref 0.1–1.0)
Monocytes Relative: 8 %
Neutro Abs: 3 10*3/uL (ref 1.7–7.7)
Neutrophils Relative %: 50 %
Platelets: 210 10*3/uL (ref 150–400)
RBC: 4.88 MIL/uL (ref 4.22–5.81)
RDW: 13.9 % (ref 11.5–15.5)
WBC: 6 10*3/uL (ref 4.0–10.5)
nRBC: 0 % (ref 0.0–0.2)

## 2021-01-03 LAB — COMPREHENSIVE METABOLIC PANEL
ALT: 24 U/L (ref 0–44)
AST: 26 U/L (ref 15–41)
Albumin: 4.1 g/dL (ref 3.5–5.0)
Alkaline Phosphatase: 91 U/L (ref 38–126)
Anion gap: 10 (ref 5–15)
BUN: 17 mg/dL (ref 8–23)
CO2: 26 mmol/L (ref 22–32)
Calcium: 9.7 mg/dL (ref 8.9–10.3)
Chloride: 103 mmol/L (ref 98–111)
Creatinine, Ser: 1.29 mg/dL — ABNORMAL HIGH (ref 0.61–1.24)
GFR, Estimated: 56 mL/min — ABNORMAL LOW (ref >60)
Glucose, Bld: 93 mg/dL (ref 70–99)
Potassium: 4 mmol/L (ref 3.5–5.1)
Sodium: 139 mmol/L (ref 135–145)
Total Bilirubin: 1.1 mg/dL (ref 0.3–1.2)
Total Protein: 7.7 g/dL (ref 6.5–8.1)

## 2021-01-03 LAB — TYPE AND SCREEN
ABO/RH(D): O POS
Antibody Screen: NEGATIVE

## 2021-01-03 LAB — URINALYSIS, ROUTINE W REFLEX MICROSCOPIC
Bacteria, UA: NONE SEEN
Bilirubin Urine: NEGATIVE
Glucose, UA: NEGATIVE mg/dL
Ketones, ur: NEGATIVE mg/dL
Leukocytes,Ua: NEGATIVE
Nitrite: NEGATIVE
Protein, ur: NEGATIVE mg/dL
Specific Gravity, Urine: 1.008 (ref 1.005–1.030)
pH: 6 (ref 5.0–8.0)

## 2021-01-03 LAB — SURGICAL PCR SCREEN
MRSA, PCR: NEGATIVE
Staphylococcus aureus: NEGATIVE

## 2021-01-03 LAB — PROTIME-INR
INR: 1 (ref 0.8–1.2)
Prothrombin Time: 13.1 seconds (ref 11.4–15.2)

## 2021-01-03 LAB — APTT: aPTT: 28 seconds (ref 24–36)

## 2021-01-03 MED ORDER — ONDANSETRON HCL 4 MG PO TABS: 4.0000 mg | ORAL_TABLET | Freq: Three times a day (TID) | ORAL | 0 refills | Status: DC | PRN

## 2021-01-03 MED ORDER — DOCUSATE SODIUM 100 MG PO CAPS: 100.0000 mg | ORAL_CAPSULE | Freq: Every day | ORAL | 2 refills | Status: DC | PRN

## 2021-01-03 MED ORDER — METHOCARBAMOL 500 MG PO TABS: 500.0000 mg | ORAL_TABLET | Freq: Two times a day (BID) | ORAL | 0 refills | Status: DC | PRN

## 2021-01-03 MED ORDER — ASPIRIN EC 81 MG PO TBEC: 81.0000 mg | DELAYED_RELEASE_TABLET | Freq: Two times a day (BID) | ORAL | 0 refills | Status: DC

## 2021-01-03 MED ORDER — OXYCODONE-ACETAMINOPHEN 5-325 MG PO TABS
1.0000 | ORAL_TABLET | Freq: Four times a day (QID) | ORAL | 0 refills | Status: DC | PRN
Start: 2021-01-03 — End: 2021-01-08

## 2021-01-03 NOTE — Progress Notes (Signed)
Surgical Instructions    Your procedure is scheduled on 01/07/21.  Report to [facility] Main Entrance "A" at 07:30 A.M., then check in with the Admitting office.  Call this number if you have problems the morning of surgery:  [phone]   If you have any questions prior to your surgery date call [phone]: Open Monday-Friday 8am-4pm    Remember:  Do not eat after midnight the night before your surgery  You may drink clear liquids until 06:30am the morning of your surgery.   Clear liquids allowed are: Water, Non-Citrus Juices (without pulp), Carbonated Beverages, Clear Tea, Black Coffee Only, and Gatorade.   Enhanced Recovery after Surgery for Orthopedics Enhanced Recovery after Surgery is a protocol used to improve the stress on your body and your recovery after surgery.  Patient Instructions  . The night before surgery:  o No food after midnight. ONLY clear liquids after midnight   . The day of surgery (if you do NOT have diabetes):  o Drink ONE (1) Pre-Surgery Clear Ensure by 6:30 A.M. the morning of surgery   o This drink was given to you during your hospital  pre-op appointment visit. o Nothing else to drink after completing the  Pre-Surgery Clear Ensure.         If you have questions, please contact your surgeon's office.     Take these medicines the morning of surgery with A SIP OF WATER  acetaminophen (TYLENOL) dorzolamide-timolol (COSOPT) eye drops felodipine (PLENDIL) pantoprazole (PROTONIX)  rOPINIRole (REQUIP) rosuvastatin (CRESTOR)   As of today, STOP taking any Aspirin (unless otherwise instructed by your surgeon) Aleve, Naproxen, Ibuprofen, Motrin, Advil, Goody's, BC's, all herbal medications, fish oil, and all vitamins.                     Do not wear jewelry, make up, or nail polish            Do not wear lotions, powders, perfumes/colognes, or deodorant.            Men may shave face and neck.            Do not bring valuables to the hospital.             [facility] is not responsible for any belongings or valuables.  Do NOT Smoke (Tobacco/Vaping) or drink Alcohol 24 hours prior to your procedure If you use a CPAP at night, you may bring all equipment for your overnight stay.   Contacts, glasses, dentures or bridgework may not be worn into surgery, please bring cases for these belongings   For patients admitted to the hospital, discharge time will be determined by your treatment team.   Patients discharged the day of surgery will not be allowed to drive home, and someone needs to stay with them for 24 hours.    Special instructions:   [place]- Preparing For Surgery  Before surgery, you can play an important role. Because skin is not sterile, your skin needs to be as free of germs as possible. You can reduce the number of germs on your skin by washing with CHG (chlorahexidine gluconate) Soap before surgery.  CHG is an antiseptic cleaner which kills germs and bonds with the skin to continue killing germs even after washing.    Oral Hygiene is also important to reduce your risk of infection.  Remember - BRUSH YOUR TEETH THE MORNING OF SURGERY WITH YOUR REGULAR TOOTHPASTE  Please do not use if you have  an allergy to CHG or antibacterial soaps. If your skin becomes reddened/irritated stop using the CHG.  Do not shave (including legs and underarms) for at least 48 hours prior to first CHG shower. It is OK to shave your face.  Please follow these instructions carefully.   1. Shower the NIGHT BEFORE SURGERY and the MORNING OF SURGERY  2. If you chose to wash your hair, wash your hair first as usual with your normal shampoo.  3. After you shampoo, rinse your hair and body thoroughly to remove the shampoo.  4. Wash Face and genitals (private parts) with your normal soap.   5.  Shower the NIGHT BEFORE SURGERY and the MORNING OF SURGERY with CHG Soap.   6. Use CHG Soap as you would any other liquid soap. You can apply CHG directly  to the skin and wash gently with a scrungie or a clean washcloth.   7. Apply the CHG Soap to your body ONLY FROM THE NECK DOWN.  Do not use on open wounds or open sores. Avoid contact with your eyes, ears, mouth and genitals (private parts). Wash Face and genitals (private parts)  with your normal soap.   8. Wash thoroughly, paying special attention to the area where your surgery will be performed.  9. Thoroughly rinse your body with warm water from the neck down.  10. DO NOT shower/wash with your normal soap after using and rinsing off the CHG Soap.  11. Pat yourself dry with a CLEAN TOWEL.  12. Wear CLEAN PAJAMAS to bed the night before surgery  13. Place CLEAN SHEETS on your bed the night before your surgery  14. DO NOT SLEEP WITH PETS.   Day of Surgery: Take a shower.  Wear Clean/Comfortable clothing the morning of surgery Do not apply any deodorants/lotions.   Remember to brush your teeth WITH YOUR REGULAR TOOTHPASTE.   Please read over the following fact sheets that you were given.

## 2021-01-03 NOTE — Progress Notes (Signed)
PCP - Dr. [name] Cardiologist - denies  Chest x-ray - 01/03/21 EKG - 01/03/21 Stress Test - 5+ years ago ECHO - denies Cardiac Cath - denies  Sleep Study - denies CPAP - denies  Blood Thinner Instructions: n/a Aspirin Instructions: n/a  ERAS Protcol - Stop clear liquids by 0630 PRE-SURGERY Ensure or G2- (1) Pre-surgical ensure provided.   COVID TEST- Will need covid test DOS. Pt had a death in the family and funeral is Saturday, 2/19.    Anesthesia review: No  Patient denies shortness of breath, fever, cough and chest pain at PAT appointment   All instructions explained to the patient, with a verbal understanding of the material. Patient agrees to go over the instructions while at home for a better understanding. Patient also instructed to self quarantine after being tested for COVID-19. The opportunity to ask questions was provided.

## 2021-01-04 MED ORDER — TRANEXAMIC ACID 1000 MG/10ML IV SOLN
2000.0000 mg | INTRAVENOUS | Status: AC
  Administered 2021-01-07: 2000 mg via TOPICAL
  Filled 2021-01-04: qty 20

## 2021-01-06 DIAGNOSIS — M1612 Unilateral primary osteoarthritis, left hip: Secondary | ICD-10-CM

## 2021-01-07 ENCOUNTER — Encounter (HOSPITAL_COMMUNITY)
Admission: RE | Disposition: A | Discharge status: Home / Self Care | Payer: Self-pay | Source: Ambulatory Visit | Attending: Orthopaedic Surgery

## 2021-01-07 ENCOUNTER — Other Ambulatory Visit: Payer: Self-pay

## 2021-01-07 ENCOUNTER — Observation Stay (HOSPITAL_COMMUNITY): Payer: Medicare HMO

## 2021-01-07 ENCOUNTER — Ambulatory Visit (HOSPITAL_COMMUNITY): Payer: Medicare HMO

## 2021-01-07 ENCOUNTER — Observation Stay (HOSPITAL_COMMUNITY)
Admission: RE | Admit: 2021-01-07 | Discharge: 2021-01-08 | Disposition: A | Discharge status: Home / Self Care | Payer: Medicare HMO | Source: Ambulatory Visit | Attending: Orthopaedic Surgery | Admitting: Orthopaedic Surgery

## 2021-01-07 ENCOUNTER — Ambulatory Visit (HOSPITAL_COMMUNITY): Payer: Medicare HMO | Admitting: Certified Registered"

## 2021-01-07 ENCOUNTER — Encounter (HOSPITAL_COMMUNITY): Payer: Self-pay | Admitting: Orthopaedic Surgery

## 2021-01-07 DIAGNOSIS — Z791 Long term (current) use of non-steroidal anti-inflammatories (NSAID): Secondary | ICD-10-CM | POA: Insufficient documentation

## 2021-01-07 DIAGNOSIS — Z7982 Long term (current) use of aspirin: Secondary | ICD-10-CM | POA: Diagnosis not present

## 2021-01-07 DIAGNOSIS — K219 Gastro-esophageal reflux disease without esophagitis: Secondary | ICD-10-CM | POA: Diagnosis not present

## 2021-01-07 DIAGNOSIS — I10 Essential (primary) hypertension: Secondary | ICD-10-CM | POA: Diagnosis not present

## 2021-01-07 DIAGNOSIS — D649 Anemia, unspecified: Secondary | ICD-10-CM | POA: Diagnosis not present

## 2021-01-07 DIAGNOSIS — M1612 Unilateral primary osteoarthritis, left hip: Secondary | ICD-10-CM | POA: Diagnosis not present

## 2021-01-07 DIAGNOSIS — E785 Hyperlipidemia, unspecified: Secondary | ICD-10-CM | POA: Diagnosis not present

## 2021-01-07 DIAGNOSIS — Z79899 Other long term (current) drug therapy: Secondary | ICD-10-CM | POA: Diagnosis not present

## 2021-01-07 DIAGNOSIS — C61 Malignant neoplasm of prostate: Secondary | ICD-10-CM | POA: Insufficient documentation

## 2021-01-07 DIAGNOSIS — Z87891 Personal history of nicotine dependence: Secondary | ICD-10-CM | POA: Insufficient documentation

## 2021-01-07 DIAGNOSIS — H409 Unspecified glaucoma: Secondary | ICD-10-CM | POA: Insufficient documentation

## 2021-01-07 DIAGNOSIS — Z419 Encounter for procedure for purposes other than remedying health state, unspecified: Secondary | ICD-10-CM

## 2021-01-07 DIAGNOSIS — Z96642 Presence of left artificial hip joint: Secondary | ICD-10-CM

## 2021-01-07 DIAGNOSIS — Z20822 Contact with and (suspected) exposure to covid-19: Secondary | ICD-10-CM | POA: Insufficient documentation

## 2021-01-07 DIAGNOSIS — Z96649 Presence of unspecified artificial hip joint: Secondary | ICD-10-CM

## 2021-01-07 DIAGNOSIS — Z471 Aftercare following joint replacement surgery: Secondary | ICD-10-CM | POA: Diagnosis not present

## 2021-01-07 HISTORY — PX: TOTAL HIP ARTHROPLASTY: SHX124

## 2021-01-07 LAB — SARS CORONAVIRUS 2 BY RT PCR (HOSPITAL ORDER, PERFORMED IN ~~LOC~~ HOSPITAL LAB): SARS Coronavirus 2: NEGATIVE

## 2021-01-07 SURGERY — ARTHROPLASTY, HIP, TOTAL, ANTERIOR APPROACH
Anesthesia: Spinal | Site: Hip | Laterality: Left

## 2021-01-07 MED ORDER — ACETAMINOPHEN 325 MG PO TABS: 325.0000 mg | ORAL_TABLET | Freq: Four times a day (QID) | ORAL | Status: DC | PRN

## 2021-01-07 MED ORDER — PHENOL 1.4 % MT LIQD: 1.0000 | OROMUCOSAL | Status: DC | PRN

## 2021-01-07 MED ORDER — ENALAPRIL-HYDROCHLOROTHIAZIDE 5-12.5 MG PO TABS: 1.0000 | ORAL_TABLET | Freq: Every day | ORAL | Status: DC

## 2021-01-07 MED ORDER — DEXAMETHASONE SODIUM PHOSPHATE 10 MG/ML IJ SOLN: 10.0000 mg | Freq: Once | INTRAMUSCULAR | Status: DC

## 2021-01-07 MED ORDER — ALUM & MAG HYDROXIDE-SIMETH 200-200-20 MG/5ML PO SUSP: 30.0000 mL | ORAL | Status: DC | PRN

## 2021-01-07 MED ORDER — METHOCARBAMOL 1000 MG/10ML IJ SOLN
500.0000 mg | Freq: Four times a day (QID) | INTRAVENOUS | Status: DC | PRN
  Filled 2021-01-07: qty 5

## 2021-01-07 MED ORDER — METOCLOPRAMIDE HCL 5 MG PO TABS: 5.0000 mg | ORAL_TABLET | Freq: Three times a day (TID) | ORAL | Status: DC | PRN

## 2021-01-07 MED ORDER — DIPHENHYDRAMINE HCL 12.5 MG/5ML PO ELIX
25.0000 mg | ORAL_SOLUTION | ORAL | Status: DC | PRN
  Filled 2021-01-07: qty 10

## 2021-01-07 MED ORDER — TRANEXAMIC ACID-NACL 1000-0.7 MG/100ML-% IV SOLN
1000.0000 mg | Freq: Once | INTRAVENOUS | Status: AC
  Administered 2021-01-07: 1000 mg via INTRAVENOUS
  Filled 2021-01-07: qty 100

## 2021-01-07 MED ORDER — LIDOCAINE 2% (20 MG/ML) 5 ML SYRINGE
INTRAMUSCULAR | Status: AC
  Filled 2021-01-07: qty 5

## 2021-01-07 MED ORDER — BUPIVACAINE-MELOXICAM ER 400-12 MG/14ML IJ SOLN
INTRAMUSCULAR | Status: AC
  Filled 2021-01-07: qty 1

## 2021-01-07 MED ORDER — SORBITOL 70 % SOLN
30.0000 mL | Freq: Every day | Status: DC | PRN
  Filled 2021-01-07: qty 30

## 2021-01-07 MED ORDER — ENALAPRIL MALEATE 5 MG PO TABS
5.0000 mg | ORAL_TABLET | Freq: Every day | ORAL | Status: DC
  Administered 2021-01-07: 5 mg via ORAL
  Filled 2021-01-07 (×2): qty 1

## 2021-01-07 MED ORDER — CHLORHEXIDINE GLUCONATE 0.12 % MT SOLN
OROMUCOSAL | Status: AC
  Administered 2021-01-07: 15 mL
  Filled 2021-01-07: qty 15

## 2021-01-07 MED ORDER — METHOCARBAMOL 500 MG PO TABS
500.0000 mg | ORAL_TABLET | Freq: Four times a day (QID) | ORAL | Status: DC | PRN
  Administered 2021-01-07: 500 mg via ORAL
  Filled 2021-01-07: qty 1

## 2021-01-07 MED ORDER — BUPIVACAINE LIPOSOME 1.3 % IJ SUSP
20.0000 mL | Freq: Once | INTRAMUSCULAR | Status: DC
  Filled 2021-01-07: qty 20

## 2021-01-07 MED ORDER — ONDANSETRON HCL 4 MG/2ML IJ SOLN
INTRAMUSCULAR | Status: DC | PRN
  Administered 2021-01-07: 4 mg via INTRAVENOUS

## 2021-01-07 MED ORDER — TRANEXAMIC ACID-NACL 1000-0.7 MG/100ML-% IV SOLN
1000.0000 mg | INTRAVENOUS | Status: AC
  Administered 2021-01-07: 1000 mg via INTRAVENOUS
  Filled 2021-01-07: qty 100

## 2021-01-07 MED ORDER — ONDANSETRON HCL 4 MG PO TABS: 4.0000 mg | ORAL_TABLET | Freq: Four times a day (QID) | ORAL | Status: DC | PRN

## 2021-01-07 MED ORDER — EPHEDRINE 5 MG/ML INJ
INTRAVENOUS | Status: AC
  Filled 2021-01-07: qty 10

## 2021-01-07 MED ORDER — POVIDONE-IODINE 10 % EX SWAB
2.0000 "application " | Freq: Once | CUTANEOUS | Status: AC
  Administered 2021-01-07: 2 via TOPICAL

## 2021-01-07 MED ORDER — LACTATED RINGERS IV SOLN: INTRAVENOUS | Status: DC

## 2021-01-07 MED ORDER — MENTHOL 3 MG MT LOZG: 1.0000 | LOZENGE | OROMUCOSAL | Status: DC | PRN

## 2021-01-07 MED ORDER — VANCOMYCIN HCL 1 G IV SOLR
INTRAVENOUS | Status: DC | PRN
  Administered 2021-01-07: 1000 mg via TOPICAL

## 2021-01-07 MED ORDER — FENTANYL CITRATE (PF) 100 MCG/2ML IJ SOLN
INTRAMUSCULAR | Status: DC | PRN
  Administered 2021-01-07 (×2): 50 ug via INTRAVENOUS

## 2021-01-07 MED ORDER — EPHEDRINE SULFATE 50 MG/ML IJ SOLN
INTRAMUSCULAR | Status: DC | PRN
  Administered 2021-01-07 (×5): 5 mg via INTRAVENOUS

## 2021-01-07 MED ORDER — MAGNESIUM CITRATE PO SOLN: 1.0000 | Freq: Once | ORAL | Status: DC | PRN

## 2021-01-07 MED ORDER — ONDANSETRON HCL 4 MG/2ML IJ SOLN: 4.0000 mg | Freq: Once | INTRAMUSCULAR | Status: DC | PRN

## 2021-01-07 MED ORDER — OXYCODONE HCL 5 MG PO TABS: 5.0000 mg | ORAL_TABLET | ORAL | Status: DC | PRN

## 2021-01-07 MED ORDER — SODIUM CHLORIDE 0.9 % IV SOLN: INTRAVENOUS | Status: DC

## 2021-01-07 MED ORDER — POLYETHYLENE GLYCOL 3350 17 G PO PACK: 17.0000 g | PACK | Freq: Every day | ORAL | Status: DC

## 2021-01-07 MED ORDER — HYDROMORPHONE HCL 1 MG/ML IJ SOLN
0.5000 mg | INTRAMUSCULAR | Status: DC | PRN
  Administered 2021-01-07: 1 mg via INTRAVENOUS
  Filled 2021-01-07: qty 1

## 2021-01-07 MED ORDER — 0.9 % SODIUM CHLORIDE (POUR BTL) OPTIME
TOPICAL | Status: DC | PRN
  Administered 2021-01-07: 1000 mL

## 2021-01-07 MED ORDER — OXYCODONE HCL 5 MG/5ML PO SOLN: 5.0000 mg | Freq: Once | ORAL | Status: DC | PRN

## 2021-01-07 MED ORDER — ROPINIROLE HCL 1 MG PO TABS: 1.0000 mg | ORAL_TABLET | Freq: Two times a day (BID) | ORAL | Status: DC

## 2021-01-07 MED ORDER — BUPIVACAINE IN DEXTROSE 0.75-8.25 % IT SOLN
INTRATHECAL | Status: DC | PRN
  Administered 2021-01-07: 2 mL via INTRATHECAL

## 2021-01-07 MED ORDER — FENTANYL CITRATE (PF) 250 MCG/5ML IJ SOLN
INTRAMUSCULAR | Status: AC
  Filled 2021-01-07: qty 5

## 2021-01-07 MED ORDER — HYDROCHLOROTHIAZIDE 12.5 MG PO CAPS: 12.5000 mg | ORAL_CAPSULE | Freq: Every day | ORAL | Status: DC

## 2021-01-07 MED ORDER — PROPOFOL 500 MG/50ML IV EMUL
INTRAVENOUS | Status: DC | PRN
  Administered 2021-01-07: 100 ug/kg/min via INTRAVENOUS

## 2021-01-07 MED ORDER — SODIUM CHLORIDE 0.9 % IR SOLN
Status: DC | PRN
  Administered 2021-01-07: 3000 mL

## 2021-01-07 MED ORDER — FELODIPINE ER 5 MG PO TB24
5.0000 mg | ORAL_TABLET | Freq: Every day | ORAL | Status: DC
  Filled 2021-01-07: qty 1

## 2021-01-07 MED ORDER — KETOROLAC TROMETHAMINE 15 MG/ML IJ SOLN
15.0000 mg | Freq: Four times a day (QID) | INTRAMUSCULAR | Status: AC
  Administered 2021-01-07 – 2021-01-08 (×4): 15 mg via INTRAVENOUS
  Filled 2021-01-07 (×4): qty 1

## 2021-01-07 MED ORDER — OXYCODONE HCL 5 MG PO TABS: 5.0000 mg | ORAL_TABLET | Freq: Once | ORAL | Status: DC | PRN

## 2021-01-07 MED ORDER — OXYCODONE HCL 5 MG PO TABS
10.0000 mg | ORAL_TABLET | ORAL | Status: DC | PRN
  Administered 2021-01-07: 10 mg via ORAL
  Filled 2021-01-07: qty 3

## 2021-01-07 MED ORDER — METOCLOPRAMIDE HCL 5 MG/ML IJ SOLN
5.0000 mg | Freq: Three times a day (TID) | INTRAMUSCULAR | Status: DC | PRN
Start: 2021-01-07 — End: 2021-01-08

## 2021-01-07 MED ORDER — ACETAMINOPHEN 500 MG PO TABS
1000.0000 mg | ORAL_TABLET | Freq: Four times a day (QID) | ORAL | Status: DC
  Administered 2021-01-07 – 2021-01-08 (×3): 1000 mg via ORAL
  Filled 2021-01-07 (×3): qty 2

## 2021-01-07 MED ORDER — IRRISEPT - 450ML BOTTLE WITH 0.05% CHG IN STERILE WATER, USP 99.95% OPTIME
TOPICAL | Status: DC | PRN
  Administered 2021-01-07: 450 mL via TOPICAL

## 2021-01-07 MED ORDER — DEXMEDETOMIDINE (PRECEDEX) IN NS 20 MCG/5ML (4 MCG/ML) IV SYRINGE
PREFILLED_SYRINGE | INTRAVENOUS | Status: AC
  Filled 2021-01-07: qty 5

## 2021-01-07 MED ORDER — BUPIVACAINE-MELOXICAM ER 400-12 MG/14ML IJ SOLN
INTRAMUSCULAR | Status: DC | PRN
  Administered 2021-01-07: 400 mg

## 2021-01-07 MED ORDER — DOCUSATE SODIUM 100 MG PO CAPS
100.0000 mg | ORAL_CAPSULE | Freq: Two times a day (BID) | ORAL | Status: DC
  Administered 2021-01-07: 100 mg via ORAL
  Filled 2021-01-07: qty 1

## 2021-01-07 MED ORDER — CEFAZOLIN SODIUM-DEXTROSE 2-4 GM/100ML-% IV SOLN
2.0000 g | INTRAVENOUS | Status: AC
  Administered 2021-01-07: 2 g via INTRAVENOUS
  Filled 2021-01-07: qty 100

## 2021-01-07 MED ORDER — ASPIRIN 81 MG PO CHEW
81.0000 mg | CHEWABLE_TABLET | Freq: Two times a day (BID) | ORAL | Status: DC
  Administered 2021-01-07: 81 mg via ORAL
  Filled 2021-01-07: qty 1

## 2021-01-07 MED ORDER — VANCOMYCIN HCL 1000 MG IV SOLR
INTRAVENOUS | Status: AC
  Filled 2021-01-07: qty 1000

## 2021-01-07 MED ORDER — CEFAZOLIN SODIUM-DEXTROSE 2-4 GM/100ML-% IV SOLN
2.0000 g | Freq: Four times a day (QID) | INTRAVENOUS | Status: AC
  Administered 2021-01-07 (×2): 2 g via INTRAVENOUS
  Filled 2021-01-07 (×2): qty 100

## 2021-01-07 MED ORDER — HYDROMORPHONE HCL 1 MG/ML IJ SOLN: 0.2500 mg | INTRAMUSCULAR | Status: DC | PRN

## 2021-01-07 MED ORDER — OXYCODONE HCL ER 10 MG PO T12A
10.0000 mg | EXTENDED_RELEASE_TABLET | Freq: Two times a day (BID) | ORAL | Status: DC
  Administered 2021-01-07 (×2): 10 mg via ORAL
  Filled 2021-01-07 (×2): qty 1

## 2021-01-07 MED ORDER — DEXMEDETOMIDINE (PRECEDEX) IN NS 20 MCG/5ML (4 MCG/ML) IV SYRINGE
PREFILLED_SYRINGE | INTRAVENOUS | Status: DC | PRN
  Administered 2021-01-07: 8 ug via INTRAVENOUS
  Administered 2021-01-07 (×2): 4 ug via INTRAVENOUS

## 2021-01-07 MED ORDER — ONDANSETRON HCL 4 MG/2ML IJ SOLN: 4.0000 mg | Freq: Four times a day (QID) | INTRAMUSCULAR | Status: DC | PRN

## 2021-01-07 SURGICAL SUPPLY — 65 items
BAG DECANTER FOR FLEXI CONT (MISCELLANEOUS) ×2 IMPLANT
CELLS DAT CNTRL 66122 CELL SVR (MISCELLANEOUS) IMPLANT
COVER PERINEAL POST (MISCELLANEOUS) ×2 IMPLANT
COVER SURGICAL LIGHT HANDLE (MISCELLANEOUS) ×2 IMPLANT
CUP SECTOR GRIPTON 58MM (Orthopedic Implant) ×2 IMPLANT
DERMABOND ADVANCED (GAUZE/BANDAGES/DRESSINGS) ×1
DERMABOND ADVANCED .7 DNX12 (GAUZE/BANDAGES/DRESSINGS) ×1 IMPLANT
DRAPE C-ARM 42X72 X-RAY (DRAPES) ×2 IMPLANT
DRAPE POUCH INSTRU U-SHP 10X18 (DRAPES) ×2 IMPLANT
DRAPE STERI IOBAN 125X83 (DRAPES) ×2 IMPLANT
DRAPE U-SHAPE 47X51 STRL (DRAPES) ×4 IMPLANT
DRSG AQUACEL AG ADV 3.5X10 (GAUZE/BANDAGES/DRESSINGS) ×2 IMPLANT
DURAPREP 26ML APPLICATOR (WOUND CARE) ×4 IMPLANT
ELECT BLADE 4.0 EZ CLEAN MEGAD (MISCELLANEOUS) ×2
ELECT REM PT RETURN 9FT ADLT (ELECTROSURGICAL) ×2
ELECTRODE BLDE 4.0 EZ CLN MEGD (MISCELLANEOUS) ×1 IMPLANT
ELECTRODE REM PT RTRN 9FT ADLT (ELECTROSURGICAL) ×1 IMPLANT
GLOVE ECLIPSE 7.0 STRL STRAW (GLOVE) ×4 IMPLANT
GLOVE SKINSENSE NS SZ7.5 (GLOVE) ×2
GLOVE SKINSENSE STRL SZ7.5 (GLOVE) ×2 IMPLANT
GLOVE SURG NEOP MICRO LF SZ7.5 (GLOVE) ×6 IMPLANT
GLOVE SURG SYN 7.5  E (GLOVE) ×8
GLOVE SURG SYN 7.5 E (GLOVE) ×4 IMPLANT
GLOVE SURG UNDER POLY LF SZ7 (GLOVE) ×2 IMPLANT
GOWN STRL REIN XL XLG (GOWN DISPOSABLE) ×2 IMPLANT
GOWN STRL REUS W/ TWL LRG LVL3 (GOWN DISPOSABLE) ×1 IMPLANT
GOWN STRL REUS W/ TWL XL LVL3 (GOWN DISPOSABLE) ×2 IMPLANT
GOWN STRL REUS W/TWL LRG LVL3 (GOWN DISPOSABLE) ×2
GOWN STRL REUS W/TWL XL LVL3 (GOWN DISPOSABLE) ×4
HANDPIECE INTERPULSE COAX TIP (DISPOSABLE) ×2
HEAD FEM STD 28X+1.5 STRL (Hips) ×2 IMPLANT
HOOD PEEL AWAY FLYTE STAYCOOL (MISCELLANEOUS) ×6 IMPLANT
INSERT DM PINNACLE 56X49 (Insert) IMPLANT
IV NS IRRIG 3000ML ARTHROMATIC (IV SOLUTION) ×2 IMPLANT
JET LAVAGE IRRISEPT WOUND (IRRIGATION / IRRIGATOR) ×2
KIT BASIN OR (CUSTOM PROCEDURE TRAY) ×2 IMPLANT
LAVAGE JET IRRISEPT WOUND (IRRIGATION / IRRIGATOR) ×1 IMPLANT
LINER ACET MENTUM 49X28 (Liner) ×2 IMPLANT
LINER DM PINN HIP 58/49 (Liner) ×2 IMPLANT
MARKER SKIN DUAL TIP RULER LAB (MISCELLANEOUS) ×2 IMPLANT
NEEDLE SPNL 18GX3.5 QUINCKE PK (NEEDLE) ×2 IMPLANT
PACK TOTAL JOINT (CUSTOM PROCEDURE TRAY) ×2 IMPLANT
PACK UNIVERSAL I (CUSTOM PROCEDURE TRAY) ×2 IMPLANT
PAD COLD SHLDR UNI WRAP-ON (PAD) ×2
PAD COLD UNI WRAP-ON (PAD) ×1 IMPLANT
RTRCTR WOUND ALEXIS 18CM MED (MISCELLANEOUS)
SAW OSC TIP CART 19.5X105X1.3 (SAW) ×2 IMPLANT
SCREW 6.5MMX25MM (Screw) ×2 IMPLANT
SET HNDPC FAN SPRY TIP SCT (DISPOSABLE) ×1 IMPLANT
STAPLER VISISTAT 35W (STAPLE) IMPLANT
STEM FEMORAL SZ6 HIGH ACTIS (Stem) ×2 IMPLANT
SUT ETHIBOND 2 V 37 (SUTURE) ×2 IMPLANT
SUT ETHILON 2 0 FS 18 (SUTURE) ×4 IMPLANT
SUT VIC AB 0 CT1 27 (SUTURE) ×2
SUT VIC AB 0 CT1 27XBRD ANBCTR (SUTURE) ×1 IMPLANT
SUT VIC AB 1 CTX 36 (SUTURE) ×2
SUT VIC AB 1 CTX36XBRD ANBCTR (SUTURE) ×1 IMPLANT
SUT VIC AB 2-0 CT1 27 (SUTURE) ×6
SUT VIC AB 2-0 CT1 TAPERPNT 27 (SUTURE) ×3 IMPLANT
SYR 50ML LL SCALE MARK (SYRINGE) ×2 IMPLANT
TOWEL GREEN STERILE (TOWEL DISPOSABLE) ×2 IMPLANT
TRAY CATH 16FR W/PLASTIC CATH (SET/KITS/TRAYS/PACK) ×2 IMPLANT
TRAY FOLEY W/BAG SLVR 16FR (SET/KITS/TRAYS/PACK) ×2
TRAY FOLEY W/BAG SLVR 16FR ST (SET/KITS/TRAYS/PACK) ×1 IMPLANT
YANKAUER SUCT BULB TIP NO VENT (SUCTIONS) ×2 IMPLANT

## 2021-01-07 NOTE — Op Note (Signed)
LEFT TOTAL HIP ARTHROPLASTY ANTERIOR APPROACH  Procedure Note Joshua Compton.   [id number]  Pre-op Diagnosis: left hip degenerative joint disease     Post-op Diagnosis: same   Operative Procedures  1. Total hip replacement; Left hip; uncemented [id number]   Surgeon: Frankey Shown, M.D.  Assist: Madalyn Rob, PA-C   Anesthesia: spinal, zynrelief  Prosthesis: Depuy Acetabulum: Pinnacle 58 mm Femur: Actis 6 HO Head: 49/28 dual mobility size: +1.5 Liner: lateralized Bearing Type: dual mobility, metal on poly  Total Hip Arthroplasty (Anterior Approach) Op Note:  After informed consent was obtained and the operative extremity marked in the holding area, the patient was brought back to the operating room and placed supine on the HANA table. Next, the operative extremity was prepped and draped in normal sterile fashion. Surgical timeout occurred verifying patient identification, surgical site, surgical procedure and administration of antibiotics.  A modified anterior Smith-Peterson approach to the hip was performed, using the interval between tensor fascia lata and sartorius.  Dissection was carried bluntly down onto the anterior hip capsule. The lateral femoral circumflex vessels were identified and coagulated. A capsulotomy was performed and the capsular flaps tagged for later repair.  The neck osteotomy was performed. The femoral head was removed which showed severe degenerative wear, the acetabular rim was cleared of soft tissue and attention was turned to reaming the acetabulum.  Sequential reaming was performed under fluoroscopic guidance. We reamed to a size 57 mm, and then impacted the acetabular shell. A 25 mm cancellous screw was placed through the shell for added fixation.  The liner was then placed after irrigation and attention turned to the femur.  After placing the femoral hook, the leg was taken to externally rotated, extended and adducted position taking care to  perform soft tissue releases to allow for adequate mobilization of the femur. Soft tissue was cleared from the shoulder of the greater trochanter and the hook elevator used to improve exposure of the proximal femur. Sequential broaching performed up to a size 6. Trial neck and head were placed. The leg was brought back up to neutral and the construct reduced.  Antibiotic irrigation was placed in the surgical wound and kept for at least 1 minute.  The position and sizing of components, offset and leg lengths were checked using fluoroscopy. Stability of the construct was checked in extension and external rotation without any subluxation or impingement of prosthesis. We dislocated the prosthesis, dropped the leg back into position, removed trial components, and irrigated copiously. The final stem and head was then placed, the leg brought back up, the system reduced and fluoroscopy used to verify positioning.  We irrigated, obtained hemostasis and closed the capsule using #2 ethibond suture.  One gram of vancomycin powder was placed in the surgical bed. A dilute solution of 20 cc of normal saline, 1.3% exparel, 0.25% bupivacaine was injected in the soft tissues.  One gram of topical tranexamic acid was injected into the joint.  The fascia was closed with #1 vicryl plus, the deep fat layer was closed with 0 vicryl, the subcutaneous layers closed with 2.0 Vicryl Plus and the skin closed with 2.0 nylon and dermabond. A sterile dressing was applied. The patient was awakened in the operating room and taken to recovery in stable condition.  All sponge, needle, and instrument counts were correct at the end of the case.   Tawanna Cooler, my PA, was a medical necessity for opening, closing, limb positioning, retracting, exposing, and overall facilitation and  timely completion of the surgery.  Position: supine  Complications: see description of procedure.  Time Out: performed   Drains/Packing: none  Estimated blood  loss: see anesthesia record  Returned to Recovery Room: in good condition.   Antibiotics: yes   Mechanical VTE (DVT) Prophylaxis: sequential compression devices, TED thigh-high  Chemical VTE (DVT) Prophylaxis: aspirin   Fluid Replacement: see anesthesia record  Specimens Removed: 1 to pathology   Sponge and Instrument Count Correct? yes   PACU: portable radiograph - low AP   Plan/RTC: Return in 2 weeks for staple removal. Weight Bearing/Load Lower Extremity: full  Hip precautions: none Suture Removal: 2 weeks   N. Eduard Roux, MD [facility] 11:18 AM   Implant Name Type Inv. Item Serial No. Manufacturer Lot No. LRB No. Used Action  CUP SECTOR GRIPTON 58MM - [id number] Orthopedic Implant CUP SECTOR GRIPTON 58MM  [employer]PU[employer] [id number] Left 1 Implanted  SCREW 6.5MMX25MM - [id number] Screw SCREW 6.5MMX25MM  [employer]PU[employer] [id number] Left 1 Implanted  PINNACLE HIP LINER    [employer]PU[employer] [id number] Left 1 Implanted  LINER ACET MENTUM 49X28 - [id number] Liner LINER ACET MENTUM 49X28  [employer] [id number] Left 1 Implanted  INSERT DM PINNACLE 56X49 - [id number] Insert INSERT DM PINNACLE 56X49  [employer] [id number] Left 1 Implanted  STEM FEMORAL SZ6 HIGH ACTIS - [id number] Stem STEM FEMORAL SZ6 HIGH ACTIS  DEPUY ORTHOPAEDICS [id number] Left 1 Implanted  HIP BALL ARTICU DEPUY - [id number] Hips HIP BALL ARTICU DEPUY  [employer]PU[employer]TH[employer] [id number] Left 1 Implanted

## 2021-01-07 NOTE — Evaluation (Signed)
Physical Therapy Evaluation Patient Details Name: Joshua Compton. MRN: [id number] DOB: 1941/05/03 Today's Date: 01/07/2021   History of Present Illness  Pt is a 80 y/o male s/p L THA, direct anterior. PMH includes prostate CA, glaucoma, HTN, and R THA.  Clinical Impression  Pt is s/p surgery above with deficits below. Pt tolerated gait training well and required min guard A with use of RW. Pt reports his wife will be able to assist as needed at d/c. Will continue to follow acutely.     Follow Up Recommendations Follow surgeon's recommendation for DC plan and follow-up therapies;Supervision for mobility/OOB    Equipment Recommendations  3in1 (PT)    Recommendations for Other Services       Precautions / Restrictions Precautions Precautions: Fall Restrictions Weight Bearing Restrictions: Yes LLE Weight Bearing: Weight bearing as tolerated      Mobility  Bed Mobility Overal bed mobility: Needs Assistance Bed Mobility: Supine to Sit     Supine to sit: Supervision     General bed mobility comments: supervision for safety. Increased time required.    Transfers Overall transfer level: Needs assistance Equipment used: Rolling walker (2 wheeled) Transfers: Sit to/from Stand Sit to Stand: Min guard         General transfer comment: Min guard for safety. Cues for hand placement.  Ambulation/Gait Ambulation/Gait assistance: Min guard Gait Distance (Feet): 100 Feet Assistive device: Rolling walker (2 wheeled) Gait Pattern/deviations: Step-to pattern;Decreased step length - right;Decreased step length - left;Decreased weight shift to left;Antalgic Gait velocity: Decreased   General Gait Details: Slow, antalgic gait. Cues for sequencing using RW and for proximity to device. Min guard for safety.  Stairs            Wheelchair Mobility    Modified Rankin (Stroke Patients Only)       Balance Overall balance assessment: Needs assistance Sitting-balance  support: No upper extremity supported;Feet supported Sitting balance-Leahy Scale: Good     Standing balance support: Bilateral upper extremity supported;During functional activity Standing balance-Leahy Scale: Poor Standing balance comment: Reliant on BUE support                             Pertinent Vitals/Pain Pain Assessment: Faces Faces Pain Scale: Hurts little more Pain Location: L hip Pain Descriptors / Indicators: Aching;Operative site guarding Pain Intervention(s): Limited activity within patient's tolerance;Monitored during session;Repositioned    Home Living Family/patient expects to be discharged to:: Private residence Living Arrangements: Spouse/significant other Available Help at Discharge: Family Type of Home: House Home Access: Stairs to enter Entrance Stairs-Rails: Right;Left;Can reach both Technical brewer of Steps: 3 Home Layout: One level Home Equipment: Environmental consultant - 2 wheels;Cane - single point      Prior Function Level of Independence: Independent               Hand Dominance        Extremity/Trunk Assessment   Upper Extremity Assessment Upper Extremity Assessment: Overall WFL for tasks assessed    Lower Extremity Assessment Lower Extremity Assessment: LLE deficits/detail LLE Deficits / Details: Deficits consistent with post op pain and weakness.    Cervical / Trunk Assessment Cervical / Trunk Assessment: Normal  Communication   Communication: No difficulties  Cognition Arousal/Alertness: Awake/alert Behavior During Therapy: WFL for tasks assessed/performed Overall Cognitive Status: Within Functional Limits for tasks assessed  General Comments General comments (skin integrity, edema, etc.): Pt's wife present during session    Exercises     Assessment/Plan    PT Assessment Patient needs continued PT services  PT Problem List Decreased strength;Decreased range  of motion;Decreased balance;Decreased activity tolerance;Decreased mobility;Decreased knowledge of use of DME;Decreased knowledge of precautions;Pain       PT Treatment Interventions DME instruction;Gait training;Stair training;Functional mobility training;Therapeutic activities;Therapeutic exercise;Balance training;Patient/family education    PT Goals (Current goals can be found in the Care Plan section)  Acute Rehab PT Goals Patient Stated Goal: to go home PT Goal Formulation: With patient Time For Goal Achievement: 01/21/21 Potential to Achieve Goals: Good    Frequency 7X/week   Barriers to discharge        Co-evaluation               AM-PAC PT "6 Clicks" Mobility  Outcome Measure Help needed turning from your back to your side while in a flat bed without using bedrails?: None Help needed moving from lying on your back to sitting on the side of a flat bed without using bedrails?: None Help needed moving to and from a bed to a chair (including a wheelchair)?: A Little Help needed standing up from a chair using your arms (e.g., wheelchair or bedside chair)?: A Little Help needed to walk in hospital room?: A Little Help needed climbing 3-5 steps with a railing? : A Lot 6 Click Score: 19    End of Session Equipment Utilized During Treatment: Gait belt Activity Tolerance: Patient tolerated treatment well Patient left: in bed;with call bell/phone within reach;with family/visitor present (sitting EOB) Nurse Communication: Mobility status PT Visit Diagnosis: Other abnormalities of gait and mobility (R26.89);Pain Pain - Right/Left: Left Pain - part of body: Hip    Time: 1455-1510 PT Time Calculation (min) (ACUTE ONLY): 15 min   Charges:   PT Evaluation $PT Eval Low Complexity: 1 Low          Lou Miner, DPT  Acute Rehabilitation Services  Pager: [phone] Office: [phone]   Rudean Hitt 01/07/2021, 3:27 PM

## 2021-01-07 NOTE — Anesthesia Procedure Notes (Signed)
Procedure Name: MAC Date/Time: 01/07/2021 9:45 AM Performed by: Georgia Duff, CRNA Pre-anesthesia Checklist: Patient identified, Emergency Drugs available, Suction available, Patient being monitored and Timeout performed Patient Re-evaluated:Patient Re-evaluated prior to induction Preoxygenation: Pre-oxygenation with 100% oxygen Induction Type: IV induction Dental Injury: Teeth and Oropharynx as per pre-operative assessment

## 2021-01-07 NOTE — Discharge Instructions (Signed)

## 2021-01-07 NOTE — Anesthesia Postprocedure Evaluation (Signed)
Anesthesia Post Note  Patient: Joshua Compton.  Procedure(s) Performed: LEFT TOTAL HIP ARTHROPLASTY ANTERIOR APPROACH (Left Hip)     Patient location during evaluation: PACU Anesthesia Type: Spinal Level of consciousness: oriented and awake and alert Pain management: pain level controlled Vital Signs Assessment: post-procedure vital signs reviewed and stable Respiratory status: spontaneous breathing, respiratory function stable and nonlabored ventilation Cardiovascular status: blood pressure returned to baseline and stable Postop Assessment: no headache, no backache, no apparent nausea or vomiting, spinal receding and patient able to bend at knees Anesthetic complications: no   No complications documented.  Last Vitals:  Vitals:   01/07/21 1234 01/07/21 1250  BP: 122/63 138/62  Pulse: (!) 48 (!) 54  Resp: 13 18  Temp:  (!) 36.2 C  SpO2: 99% 96%    Last Pain:  Vitals:   01/07/21 1250  TempSrc: Oral  PainSc:                  Braylee Lal A.

## 2021-01-07 NOTE — Anesthesia Procedure Notes (Signed)
Spinal  Patient location during procedure: OR Start time: 01/07/2021 9:41 AM End time: 01/07/2021 9:44 AM Staffing Performed: anesthesiologist  Anesthesiologist: Josephine Igo, MD Preanesthetic Checklist Completed: patient identified, IV checked, site marked, risks and benefits discussed, surgical consent, monitors and equipment checked, pre-op evaluation and timeout performed Spinal Block Patient position: sitting Prep: DuraPrep and site prepped and draped Patient monitoring: heart rate, cardiac monitor, continuous pulse ox and blood pressure Approach: midline Location: L3-4 Injection technique: single-shot Needle Needle type: Pencan  Needle gauge: 24 G Needle length: 9 cm Needle insertion depth: 6 cm Assessment Sensory level: T4 Additional Notes Patient tolerated procedure well. Adequate sensory level.

## 2021-01-07 NOTE — Transfer of Care (Signed)
Immediate Anesthesia Transfer of Care Note  Patient: Joshua Compton.  Procedure(s) Performed: LEFT TOTAL HIP ARTHROPLASTY ANTERIOR APPROACH (Left Hip)  Patient Location: PACU  Anesthesia Type:Spinal  Level of Consciousness: awake, alert  and oriented  Airway & Oxygen Therapy: Patient Spontanous Breathing and Patient connected to face mask oxygen  Post-op Assessment: Report given to RN and Post -op Vital signs reviewed and stable  Post vital signs: Reviewed and stable  Last Vitals:  Vitals Value Taken Time  BP 107/61 01/07/21 1148  Temp    Pulse 54 01/07/21 1153  Resp 15 01/07/21 1153  SpO2 98 % 01/07/21 1153  Vitals shown include unvalidated device data.  Last Pain:  Vitals:   01/07/21 0720  TempSrc:   PainSc: 4          Complications: No complications documented.

## 2021-01-07 NOTE — H&P (Signed)
PREOPERATIVE H&P  Chief Complaint: left hip degenerative joint disease  HPI: Joshua Compton. is a 80 y.o. male who presents for surgical treatment of left hip degenerative joint disease.  He denies any changes in medical history.  Past Medical History:  Diagnosis Date  . Arthritis   . Cancer [facility])    prostate ca   seed implants  . GERD (gastroesophageal reflux disease)   . Glaucoma   . Hyperlipidemia   . Hypertension    dr Jacelyn Grip     Willeen Niece   fm   Past Surgical History:  Procedure Laterality Date  . BACK SURGERY     x2  . COLONOSCOPY    . HERNIA REPAIR  11-06-2009   left inguinal repair   . NECK SURGERY    . ROTATOR CUFF REPAIR Left jan 2016  . TOTAL HIP ARTHROPLASTY Right 04/20/2020   Procedure: RIGHT TOTAL HIP ARTHROPLASTY ANTERIOR APPROACH;  Surgeon: Leandrew Koyanagi, MD;  Location: [place];  Service: Orthopedics;  Laterality: Right;  . TRANSURETHRAL RESECTION OF PROSTATE     Social History   Socioeconomic History  . Marital status: Married    Spouse name: Not on file  . Number of children: 5  . Years of education: 65  . Highest education level: 11th grade  Occupational History  . Occupation: Artist: retired  Tobacco Use  . Smoking status: Former Smoker    Packs/day: 0.50    Years: 30.00    Pack years: 15.00    Types: Cigarettes    Quit date: 03/28/2005    Years since quitting: 15.7  . Smokeless tobacco: Never Used  Vaping Use  . Vaping Use: Never used  Substance and Sexual Activity  . Alcohol use: No    Alcohol/week: 0.0 standard drinks  . Drug use: No  . Sexual activity: Yes  Other Topics Concern  . Not on file  Social History Narrative   ** Merged History Encounter **       Social Determinants of Health   Financial Resource Strain: Not on file  Food Insecurity: Not on file  Transportation Needs: Not on file  Physical Activity: Not on file  Stress: Not on file  Social Connections: Not on file   Family History   Problem Relation Age of Onset  . Cancer Mother        breast  . Heart disease Father   . Cancer Brother   . Cancer Brother    Allergies  Allergen Reactions  . Pravastatin Sodium     Muscle aches   Prior to Admission medications   Medication Sig Start Date End Date Taking? Authorizing Provider  acetaminophen (TYLENOL) 500 MG tablet Take 1,500 mg by mouth every 8 (eight) hours as needed for moderate pain or headache.   Yes [provider]  amoxicillin (AMOXIL) 500 MG capsule Take 4 pills one hour to dental procedure Patient taking differently: Take 2,000 mg by mouth See admin instructions. Take 2000 1 hour prior to dental work 12/24/20  Yes Dwana Melena L, PA-C  dorzolamide-timolol (COSOPT) 22.3-6.8 MG/ML ophthalmic solution Place 1 drop into both eyes daily.  08/04/16  Yes [provider]  Enalapril-hydroCHLOROthiazide 5-12.5 MG tablet Take 1 tablet by mouth daily. 11/29/20  Yes [name], MD  felodipine (PLENDIL) 5 MG 24 hr tablet Take 1 tablet (5 mg total) by mouth daily. For blood pressure 11/29/20  Yes [name], MD  ibuprofen (ADVIL) 200 MG tablet  Take 600 mg by mouth every 6 (six) hours as needed for moderate pain.   Yes [provider]  pantoprazole (PROTONIX) 40 MG tablet Take 1 tablet (40 mg total) by mouth daily. For heartburn 11/29/20  Yes [name], MD  aspirin EC 81 MG tablet Take 1 tablet (81 mg total) by mouth 2 (two) times daily. To be taken after surgery 01/03/21   Aundra Dubin, PA-C  docusate sodium (COLACE) 100 MG capsule Take 1 capsule (100 mg total) by mouth daily as needed. 01/03/21 01/03/22  Aundra Dubin, PA-C  methocarbamol (ROBAXIN) 500 MG tablet Take 1 tablet (500 mg total) by mouth 2 (two) times daily as needed. To be taken after surgery 01/03/21   Aundra Dubin, PA-C  mirtazapine (REMERON) 15 MG tablet Take 1 tablet (15 mg total) by mouth at bedtime. For sleep Patient not taking: Reported on 12/28/2020 12/11/20    [name], MD  ondansetron (ZOFRAN) 4 MG tablet Take 1 tablet (4 mg total) by mouth every 8 (eight) hours as needed for nausea or vomiting. 01/03/21   Aundra Dubin, PA-C  ondansetron (ZOFRAN) 4 MG tablet Take 1 tablet (4 mg total) by mouth every 8 (eight) hours as needed for nausea or vomiting. 01/03/21   Aundra Dubin, PA-C  oxyCODONE-acetaminophen (PERCOCET) 5-325 MG tablet Take 1-2 tablets by mouth every 6 (six) hours as needed. To be taken after surgery 01/03/21   Aundra Dubin, PA-C  rOPINIRole (REQUIP) 1 MG tablet Take 1 mg by mouth 2 (two) times daily. 10/25/20   [provider]  rosuvastatin (CRESTOR) 5 MG tablet Take 5 mg by mouth daily.    [provider]     Positive ROS: All other systems have been reviewed and were otherwise negative with the exception of those mentioned in the HPI and as above.  Physical Exam: General: Alert, no acute distress Cardiovascular: No pedal edema Respiratory: No cyanosis, no use of accessory musculature GI: abdomen soft Skin: No lesions in the area of chief complaint Neurologic: Sensation intact distally Psychiatric: Patient is competent for consent with normal mood and affect Lymphatic: no lymphedema  MUSCULOSKELETAL: exam stable  Assessment: left hip degenerative joint disease  Plan: Plan for Procedure(s): LEFT TOTAL HIP ARTHROPLASTY ANTERIOR APPROACH  The risks benefits and alternatives were discussed with the patient including but not limited to the risks of nonoperative treatment, versus surgical intervention including infection, bleeding, nerve injury,  blood clots, cardiopulmonary complications, morbidity, mortality, among others, and they were willing to proceed.   Preoperative templating of the joint replacement has been completed, documented, and submitted to the Operating Room personnel in order to optimize intra-operative equipment management.   Eduard Roux, MD 01/07/2021 8:38 AM

## 2021-01-07 NOTE — Anesthesia Preprocedure Evaluation (Addendum)
Anesthesia Evaluation  Patient identified by MRN, date of birth, ID band Patient awake    Reviewed: Allergy & Precautions, NPO status , Patient's Chart, lab work & pertinent test results  Airway Mallampati: II  TM Distance: >3 FB Neck ROM: Full    Dental no notable dental hx. (+) Teeth Intact, Caps,    Pulmonary former smoker,    Pulmonary exam normal breath sounds clear to auscultation       Cardiovascular hypertension, Pt. on medications Normal cardiovascular exam Rhythm:Regular Rate:Normal     Neuro/Psych Restless legs syndrome Glaucoma negative psych ROS   GI/Hepatic Neg liver ROS, GERD  Medicated and Controlled,  Endo/Other  Hyperlipidemia  Renal/GU Renal InsufficiencyRenal disease   Hx/o prostate Ca S/P TURP, Seed implants    Musculoskeletal  (+) Arthritis , Osteoarthritis,  Left hip DJD   Abdominal   Peds  Hematology  (+) anemia ,   Anesthesia Other Findings   Reproductive/Obstetrics                            Anesthesia Physical Anesthesia Plan  ASA: II  Anesthesia Plan: Spinal   Post-op Pain Management:    Induction: Intravenous  PONV Risk Score and Plan: 2 and Propofol infusion, Treatment may vary due to age or medical condition and Ondansetron  Airway Management Planned: Natural Airway and Simple Face Mask  Additional Equipment:   Intra-op Plan:   Post-operative Plan:   Informed Consent: I have reviewed the patients History and Physical, chart, labs and discussed the procedure including the risks, benefits and alternatives for the proposed anesthesia with the patient or authorized representative who has indicated his/her understanding and acceptance.     Dental advisory given  Plan Discussed with: CRNA and Anesthesiologist  Anesthesia Plan Comments:         Anesthesia Quick Evaluation

## 2021-01-08 ENCOUNTER — Other Ambulatory Visit: Payer: Self-pay | Admitting: Physician Assistant

## 2021-01-08 ENCOUNTER — Telehealth: Payer: Self-pay | Admitting: Orthopedic Surgery

## 2021-01-08 ENCOUNTER — Telehealth: Payer: Self-pay | Admitting: Orthopaedic Surgery

## 2021-01-08 DIAGNOSIS — Z791 Long term (current) use of non-steroidal anti-inflammatories (NSAID): Secondary | ICD-10-CM | POA: Diagnosis not present

## 2021-01-08 DIAGNOSIS — C61 Malignant neoplasm of prostate: Secondary | ICD-10-CM | POA: Diagnosis not present

## 2021-01-08 DIAGNOSIS — E785 Hyperlipidemia, unspecified: Secondary | ICD-10-CM | POA: Diagnosis not present

## 2021-01-08 DIAGNOSIS — H409 Unspecified glaucoma: Secondary | ICD-10-CM | POA: Diagnosis not present

## 2021-01-08 DIAGNOSIS — Z20822 Contact with and (suspected) exposure to covid-19: Secondary | ICD-10-CM | POA: Diagnosis not present

## 2021-01-08 DIAGNOSIS — K219 Gastro-esophageal reflux disease without esophagitis: Secondary | ICD-10-CM | POA: Diagnosis not present

## 2021-01-08 DIAGNOSIS — M1612 Unilateral primary osteoarthritis, left hip: Secondary | ICD-10-CM | POA: Diagnosis not present

## 2021-01-08 DIAGNOSIS — Z96642 Presence of left artificial hip joint: Secondary | ICD-10-CM | POA: Diagnosis not present

## 2021-01-08 DIAGNOSIS — Z79899 Other long term (current) drug therapy: Secondary | ICD-10-CM | POA: Diagnosis not present

## 2021-01-08 DIAGNOSIS — Z87891 Personal history of nicotine dependence: Secondary | ICD-10-CM | POA: Diagnosis not present

## 2021-01-08 DIAGNOSIS — I10 Essential (primary) hypertension: Secondary | ICD-10-CM | POA: Diagnosis not present

## 2021-01-08 LAB — CBC
HCT: 40 % (ref 39.0–52.0)
Hemoglobin: 12.4 g/dL — ABNORMAL LOW (ref 13.0–17.0)
MCH: 29.8 pg (ref 26.0–34.0)
MCHC: 31 g/dL (ref 30.0–36.0)
MCV: 96.2 fL (ref 80.0–100.0)
Platelets: 178 10*3/uL (ref 150–400)
RBC: 4.16 MIL/uL — ABNORMAL LOW (ref 4.22–5.81)
RDW: 14.1 % (ref 11.5–15.5)
WBC: 6.8 10*3/uL (ref 4.0–10.5)
nRBC: 0 % (ref 0.0–0.2)

## 2021-01-08 LAB — BASIC METABOLIC PANEL
Anion gap: 7 (ref 5–15)
BUN: 14 mg/dL (ref 8–23)
CO2: 29 mmol/L (ref 22–32)
Calcium: 8.8 mg/dL — ABNORMAL LOW (ref 8.9–10.3)
Chloride: 101 mmol/L (ref 98–111)
Creatinine, Ser: 1.44 mg/dL — ABNORMAL HIGH (ref 0.61–1.24)
GFR, Estimated: 49 mL/min — ABNORMAL LOW (ref >60)
Glucose, Bld: 112 mg/dL — ABNORMAL HIGH (ref 70–99)
Potassium: 3.9 mmol/L (ref 3.5–5.1)
Sodium: 137 mmol/L (ref 135–145)

## 2021-01-08 MED ORDER — OXYCODONE-ACETAMINOPHEN 5-325 MG PO TABS: 1.0000 | ORAL_TABLET | Freq: Four times a day (QID) | ORAL | 0 refills | Status: DC | PRN

## 2021-01-08 MED ORDER — ASPIRIN EC 81 MG PO TBEC: 81.0000 mg | DELAYED_RELEASE_TABLET | Freq: Two times a day (BID) | ORAL | 0 refills | Status: DC

## 2021-01-08 MED ORDER — METHOCARBAMOL 500 MG PO TABS: 500.0000 mg | ORAL_TABLET | Freq: Two times a day (BID) | ORAL | 0 refills | Status: DC | PRN

## 2021-01-08 MED ORDER — DOCUSATE SODIUM 100 MG PO CAPS: 100.0000 mg | ORAL_CAPSULE | Freq: Every day | ORAL | 2 refills | Status: DC | PRN

## 2021-01-08 MED ORDER — ONDANSETRON HCL 4 MG PO TABS: 4.0000 mg | ORAL_TABLET | Freq: Three times a day (TID) | ORAL | 0 refills | Status: DC | PRN

## 2021-01-08 MED ORDER — SODIUM CHLORIDE 0.9 % IV SOLN: INTRAVENOUS | Status: DC

## 2021-01-08 NOTE — Telephone Encounter (Signed)
Pt was informed as well

## 2021-01-08 NOTE — Telephone Encounter (Signed)
Pt has not gotten any of his medication for after surgery so he was wondering if you could send it to the [facility] [place] that is in his chart!

## 2021-01-08 NOTE — Plan of Care (Addendum)
Pt doing well. Pt given D/C instructions with verbal understanding. Rx's were sent to the pharmacy by MD. Pt's incision is clean and dry with no sign of infection. Pt's IV was removed prior to D/C. Pt D/C'd home via wheelchair per MD order. Pt received 3-n-1 from [facility] per MD order. Pt is stable @ D/C and has no other needs at this time. Holli Humbles, RN

## 2021-01-08 NOTE — Telephone Encounter (Signed)
Patient called. He still can not get his pain medication. He would like someone to call him. [phone].

## 2021-01-08 NOTE — Telephone Encounter (Signed)
Patient called advised the pharmacy said there is not a Rx for pain that has been called in. [facility] [address]. Patient said he has been discharged from the hospital. Patient said he does not have anything to take for the pain. The number to contact patient is [phone]

## 2021-01-08 NOTE — Telephone Encounter (Signed)
Patient aware rx sent into correct pharm

## 2021-01-08 NOTE — Progress Notes (Signed)
Subjective: 1 Day Post-Op Procedure(s) (LRB): LEFT TOTAL HIP ARTHROPLASTY ANTERIOR APPROACH (Left) Patient reports pain as mild.    Objective: Vital signs in last 24 hours: Temp:  [97 F (36.1 C)-98.2 F (36.8 C)] 98.2 F (36.8 C) (02/22 0739) Pulse Rate:  [48-75] 70 (02/22 0739) Resp:  [11-20] 18 (02/22 0739) BP: (95-139)/(53-69) 137/67 (02/22 0739) SpO2:  [91 %-99 %] 91 % (02/22 0739)  Intake/Output from previous day: 02/21 0701 - 02/22 0700 In: 1200 [I.V.:1000; IV Piggyback:200] Out: 1900 [Urine:1700; Blood:200] Intake/Output this shift: No intake/output data recorded.  Recent Labs    01/08/21 0555  HGB 12.4*   Recent Labs    01/08/21 0555  WBC 6.8  RBC 4.16*  HCT 40.0  PLT 178   Recent Labs    01/08/21 0555  NA 137  K 3.9  CL 101  CO2 29  BUN 14  CREATININE 1.44*  GLUCOSE 112*  CALCIUM 8.8*   No results for input(s): LABPT, INR in the last 72 hours.  Neurologically intact Neurovascular intact Sensation intact distally Intact pulses distally Dorsiflexion/Plantar flexion intact Incision: dressing C/D/I No cellulitis present Compartment soft   Assessment/Plan: 1 Day Post-Op Procedure(s) (LRB): LEFT TOTAL HIP ARTHROPLASTY ANTERIOR APPROACH (Left) Advance diet Up with therapy Discharge home with home health after first or second PT session depending on progression with PT WBAT LLE ABLA AKI- continue fluids at 75/hr.  Encouraged PO fluids.  D/c toradol      Aundra Dubin 01/08/2021, 8:00 AM

## 2021-01-08 NOTE — Telephone Encounter (Signed)
See previous message

## 2021-01-08 NOTE — Telephone Encounter (Signed)
Sent in to new pharmacy 

## 2021-01-08 NOTE — Discharge Summary (Signed)
Patient ID: Joshua Compton. MRN: [id number] DOB/AGE: July 02, 1941 80 y.o.  Admit date: 01/07/2021 Discharge date: 01/08/2021  Admission Diagnoses:  Principal Problem:   Primary osteoarthritis of left hip Active Problems:   Status post total replacement of left hip   Discharge Diagnoses:  Same  Past Medical History:  Diagnosis Date  . Arthritis   . Cancer [facility])    prostate ca   seed implants  . GERD (gastroesophageal reflux disease)   . Glaucoma   . Hyperlipidemia   . Hypertension    dr Jacelyn Grip     Joshua Compton   fm    Surgeries: Procedure(s): LEFT TOTAL HIP ARTHROPLASTY ANTERIOR APPROACH on 01/07/2021   Consultants:   Discharged Condition: Improved  Hospital Course: Joshua Compton. is an 80 y.o. male who was admitted 01/07/2021 for operative treatment ofPrimary osteoarthritis of left hip. Patient has severe unremitting pain that affects sleep, daily activities, and work/hobbies. After pre-op clearance the patient was taken to the operating room on 01/07/2021 and underwent  Procedure(s): LEFT TOTAL HIP ARTHROPLASTY ANTERIOR APPROACH.    Patient was given perioperative antibiotics:  Anti-infectives (From admission, onward)   Start     Dose/Rate Route Frequency Ordered Stop   01/07/21 1600  ceFAZolin (ANCEF) IVPB 2g/100 mL premix        2 g 200 mL/hr over 30 Minutes Intravenous Every 6 hours 01/07/21 1247 01/07/21 2131   01/07/21 1108  vancomycin (VANCOCIN) powder  Status:  Discontinued          As needed 01/07/21 1108 01/07/21 1154   01/07/21 0715  ceFAZolin (ANCEF) IVPB 2g/100 mL premix        2 g 200 mL/hr over 30 Minutes Intravenous On call to O.R. 01/07/21 8341 01/07/21 0943       Patient was given sequential compression devices, early ambulation, and chemoprophylaxis to prevent DVT.  Patient benefited maximally from hospital stay and there were no complications.    Recent vital signs:  Patient Vitals for the past 24 hrs:  BP Temp Temp src Pulse Resp SpO2   01/08/21 0739 137/67 98.2 F (36.8 C) Oral 70 18 91 %  01/08/21 0404 128/64 98 F (36.7 C) Oral 65 20 92 %  01/07/21 2328 (!) 127/53 98.1 F (36.7 C) Oral 70 20 93 %  01/07/21 1933 139/69 97.7 F (36.5 C) Oral 75 20 94 %  01/07/21 1545 95/64 97.9 F (36.6 C) -- 66 18 95 %  01/07/21 1250 138/62 (!) 97.2 F (36.2 C) Oral (!) 54 18 96 %  01/07/21 1234 122/63 -- -- (!) 48 13 99 %  01/07/21 1218 (!) 121/57 (!) 97.5 F (36.4 C) -- (!) 51 16 94 %  01/07/21 1203 105/61 -- -- 61 16 96 %  01/07/21 1149 107/61 (!) 97 F (36.1 C) -- (!) 59 11 99 %     Recent laboratory studies:  Recent Labs    01/08/21 0555  WBC 6.8  HGB 12.4*  HCT 40.0  PLT 178  NA 137  K 3.9  CL 101  CO2 29  BUN 14  CREATININE 1.44*  GLUCOSE 112*  CALCIUM 8.8*     Discharge Medications:   Allergies as of 01/08/2021      Reactions   Pravastatin Sodium    Muscle aches      Medication List    STOP taking these medications   acetaminophen 500 MG tablet Commonly known as: TYLENOL   ibuprofen 200 MG tablet Commonly known as: ADVIL  TAKE these medications   amoxicillin 500 MG capsule Commonly known as: AMOXIL Take 4 pills one hour to dental procedure What changed:   how much to take  how to take this  when to take this  additional instructions   aspirin EC 81 MG tablet Take 1 tablet (81 mg total) by mouth 2 (two) times daily. To be taken after surgery   docusate sodium 100 MG capsule Commonly known as: Colace Take 1 capsule (100 mg total) by mouth daily as needed.   dorzolamide-timolol 22.3-6.8 MG/ML ophthalmic solution Commonly known as: COSOPT Place 1 drop into both eyes daily.   Enalapril-hydroCHLOROthiazide 5-12.5 MG tablet Take 1 tablet by mouth daily.   felodipine 5 MG 24 hr tablet Commonly known as: PLENDIL Take 1 tablet (5 mg total) by mouth daily. For blood pressure   methocarbamol 500 MG tablet Commonly known as: Robaxin Take 1 tablet (500 mg total) by mouth 2  (two) times daily as needed. To be taken after surgery   mirtazapine 15 MG tablet Commonly known as: Remeron Take 1 tablet (15 mg total) by mouth at bedtime. For sleep   ondansetron 4 MG tablet Commonly known as: Zofran Take 1 tablet (4 mg total) by mouth every 8 (eight) hours as needed for nausea or vomiting. What changed: Another medication with the same name was removed. Continue taking this medication, and follow the directions you see here.   oxyCODONE-acetaminophen 5-325 MG tablet Commonly known as: Percocet Take 1-2 tablets by mouth every 6 (six) hours as needed. To be taken after surgery   pantoprazole 40 MG tablet Commonly known as: PROTONIX Take 1 tablet (40 mg total) by mouth daily. For heartburn   rOPINIRole 1 MG tablet Commonly known as: REQUIP Take 1 mg by mouth 2 (two) times daily.   rosuvastatin 5 MG tablet Commonly known as: CRESTOR Take 5 mg by mouth daily.            Durable Medical Equipment  (From admission, onward)         Start     Ordered   01/07/21 1248  DME Walker rolling  Once       Question:  Patient needs a walker to treat with the following condition  Answer:  History of hip replacement   01/07/21 1247   01/07/21 1248  DME 3 n 1  Once        01/07/21 1247   01/07/21 1248  DME Bedside commode  Once       Question:  Patient needs a bedside commode to treat with the following condition  Answer:  History of hip replacement   01/07/21 1247          Diagnostic Studies: DG Chest 2 View  Result Date: 01/04/2021 CLINICAL DATA:  Preoperative assessment for hip surgery EXAM: CHEST - 2 VIEW COMPARISON:  April 17, 2020 FINDINGS: No edema or airspace opacity. Heart size and pulmonary vascularity are normal. No adenopathy. There is aortic atherosclerosis. No bone lesions. Postoperative change incompletely visualized lower cervical spine. IMPRESSION: No edema or airspace opacity. Cardiac silhouette within normal limits. Aortic Atherosclerosis  (ICD10-I70.0). Electronically Signed   By: Lowella Grip III M.D.   On: 01/04/2021 14:00   DG Pelvis Portable  Result Date: 01/07/2021 CLINICAL DATA:  Status post left hip arthroplasty EXAM: PORTABLE PELVIS 1-2 VIEWS COMPARISON:  07/17/2020 pelvic radiograph FINDINGS: Previous right total hip arthroplasty. Interval left total hip arthroplasty. No evidence of hip dislocation on this frontal view. No  hardware or bone fractures. No focal osseous lesions. Partially visualized posterior spinal fusion hardware in the lower lumbar spine. Brachytherapy seeds overlie the region of the prostate. Expected soft tissue gas surrounding the left hip joint. IMPRESSION: Satisfactory immediate postoperative frontal view appearance status post interval left total hip arthroplasty. Electronically Signed   By: Ilona Sorrel M.D.   On: 01/07/2021 12:31   DG C-Arm 1-60 Min  Result Date: 01/07/2021 CLINICAL DATA:  Left total hip arthroplasty. EXAM: DG C-ARM 1-60 MIN; OPERATIVE LEFT HIP WITH PELVIS FLUOROSCOPY TIME:  Fluoroscopy Time:  34.1 seconds Radiation Exposure Index (if provided by the fluoroscopic device): 2.6535 mGy COMPARISON:  07/17/2020. FINDINGS: Two C-arm fluoroscopic images were obtained intraoperatively and submitted for post operative interpretation. These images demonstrate postsurgical changes of left total hip arthroplasty. No unexpected findings. Partially imaged prior right total hip arthroplasty. Suspected prostate radiation seeds noted. Please see the performing provider's procedural report for further detail. IMPRESSION: Left total hip arthroplasty. Electronically Signed   By: Margaretha Sheffield MD   On: 01/07/2021 11:28   DG HIP OPERATIVE UNILAT W OR W/O PELVIS LEFT  Result Date: 01/07/2021 CLINICAL DATA:  Left total hip arthroplasty. EXAM: DG C-ARM 1-60 MIN; OPERATIVE LEFT HIP WITH PELVIS FLUOROSCOPY TIME:  Fluoroscopy Time:  34.1 seconds Radiation Exposure Index (if provided by the fluoroscopic  device): 2.6535 mGy COMPARISON:  07/17/2020. FINDINGS: Two C-arm fluoroscopic images were obtained intraoperatively and submitted for post operative interpretation. These images demonstrate postsurgical changes of left total hip arthroplasty. No unexpected findings. Partially imaged prior right total hip arthroplasty. Suspected prostate radiation seeds noted. Please see the performing provider's procedural report for further detail. IMPRESSION: Left total hip arthroplasty. Electronically Signed   By: Margaretha Sheffield MD   On: 01/07/2021 11:28    Disposition: Discharge disposition: 01-Home or Self Care          Follow-up Information    Leandrew Koyanagi, MD. Schedule an appointment as soon as possible for a visit in 2 weeks.   Specialty: Orthopedic Surgery Contact information: [address] [place] [address] [phone]                Signed: Aundra Dubin 01/08/2021, 8:02 AM

## 2021-01-08 NOTE — Telephone Encounter (Signed)
Fyi  I called the pharmacy and they are just waiting on the other pharmacy to cancel their rx so they can refill the pain medication for pt. She stated she informed pt I would be a little bit of time for this to happen. They have already contacted the other pharmacy. She said nothing was need from our office.

## 2021-01-08 NOTE — Progress Notes (Signed)
Physical Therapy Treatment Patient Details Name: Joshua Compton. MRN: [id number] DOB: October 22, 1941 Today's Date: 01/08/2021    History of Present Illness Pt is a 80 y/o male s/p L THA, direct anterior. PMH includes prostate CA, glaucoma, HTN, and R THA.    PT Comments    Pt progressing towards physical therapy goals. Was able to perform transfers and ambulation with gross min guard assist to supervision for safety with RW for support. Pt was able to complete stair training and was educated on car transfer. HEP issued and reviewed with pt. Pt demonstrated seated/supine exercise and we verbally reviewed standing exercises. Will continue to follow and progress as able per POC.     Follow Up Recommendations  Follow surgeon's recommendation for DC plan and follow-up therapies;Supervision for mobility/OOB     Equipment Recommendations  3in1 (PT)    Recommendations for Other Services       Precautions / Restrictions Precautions Precautions: Fall Restrictions Weight Bearing Restrictions: Yes LLE Weight Bearing: Weight bearing as tolerated    Mobility  Bed Mobility Overal bed mobility: Modified Independent Bed Mobility: Supine to Sit           General bed mobility comments: HOB flat. Pt was able to transition to EOB without difficulty.    Transfers Overall transfer level: Needs assistance Equipment used: Rolling walker (2 wheeled) Transfers: Sit to/from Stand Sit to Stand: Supervision         General transfer comment: Light supervision for safety as pt powered up to full stand. Pt demonstrated proper hand placement on seated surface for safety.  Ambulation/Gait Ambulation/Gait assistance: Supervision Gait Distance (Feet): 250 Feet Assistive device: Rolling walker (2 wheeled) Gait Pattern/deviations: Step-through pattern;Trunk flexed;Decreased stride length Gait velocity: Decreased Gait velocity interpretation: 1.31 - 2.62 ft/sec, indicative of limited community  ambulator General Gait Details: VC's for improved posture and closer walker proximity. Pt with good heel strike and equal step/stride length.   Stairs Stairs: Yes Stairs assistance: Min guard Stair Management: Two rails;Step to pattern;Forwards Number of Stairs: 4 General stair comments: VC's for sequencing and general safety. No assist required but hands on guarding for safety as pt negotiated stairs.   Wheelchair Mobility    Modified Rankin (Stroke Patients Only)       Balance Overall balance assessment: Needs assistance Sitting-balance support: No upper extremity supported;Feet supported Sitting balance-Leahy Scale: Good     Standing balance support: Bilateral upper extremity supported;During functional activity Standing balance-Leahy Scale: Poor Standing balance comment: Reliant on BUE support                            Cognition Arousal/Alertness: Awake/alert Behavior During Therapy: WFL for tasks assessed/performed Overall Cognitive Status: Within Functional Limits for tasks assessed                                        Exercises Total Joint Exercises Ankle Circles/Pumps: 10 reps Quad Sets: 10 reps Short Arc Quad: 10 reps Heel Slides: 10 reps Hip ABduction/ADduction: 10 reps Long Arc Quad: 10 reps    General Comments        Pertinent Vitals/Pain Pain Assessment: Faces Faces Pain Scale: Hurts a little bit Pain Location: L hip Pain Descriptors / Indicators: Aching;Operative site guarding Pain Intervention(s): Limited activity within patient's tolerance;Monitored during session;Repositioned    Home Living  Prior Function            PT Goals (current goals can now be found in the care plan section) Acute Rehab PT Goals Patient Stated Goal: to go home PT Goal Formulation: With patient Time For Goal Achievement: 01/21/21 Potential to Achieve Goals: Good Progress towards PT goals: Progressing  toward goals    Frequency    7X/week      PT Plan Current plan remains appropriate    Co-evaluation              AM-PAC PT "6 Clicks" Mobility   Outcome Measure  Help needed turning from your back to your side while in a flat bed without using bedrails?: None Help needed moving from lying on your back to sitting on the side of a flat bed without using bedrails?: None Help needed moving to and from a bed to a chair (including a wheelchair)?: A Little Help needed standing up from a chair using your arms (e.g., wheelchair or bedside chair)?: A Little Help needed to walk in hospital room?: A Little Help needed climbing 3-5 steps with a railing? : A Lot 6 Click Score: 19    End of Session Equipment Utilized During Treatment: Gait belt Activity Tolerance: Patient tolerated treatment well Patient left: in bed;with call bell/phone within reach Nurse Communication: Mobility status PT Visit Diagnosis: Other abnormalities of gait and mobility (R26.89);Pain Pain - Right/Left: Left Pain - part of body: Hip     Time: 9507-2257 PT Time Calculation (min) (ACUTE ONLY): 27 min  Charges:  $Gait Training: 8-22 mins $Therapeutic Exercise: 8-22 mins                     Rolinda Roan, PT, DPT Acute Rehabilitation Services Pager: [phone] Office: [phone]    Thelma Comp 01/08/2021, 9:48 AM

## 2021-01-08 NOTE — Telephone Encounter (Signed)
This is a XU pt had a total hip yesterday.

## 2021-01-09 DIAGNOSIS — E785 Hyperlipidemia, unspecified: Secondary | ICD-10-CM | POA: Diagnosis not present

## 2021-01-09 DIAGNOSIS — H409 Unspecified glaucoma: Secondary | ICD-10-CM | POA: Diagnosis not present

## 2021-01-09 DIAGNOSIS — Z7982 Long term (current) use of aspirin: Secondary | ICD-10-CM | POA: Diagnosis not present

## 2021-01-09 DIAGNOSIS — Z471 Aftercare following joint replacement surgery: Secondary | ICD-10-CM | POA: Diagnosis not present

## 2021-01-09 DIAGNOSIS — K219 Gastro-esophageal reflux disease without esophagitis: Secondary | ICD-10-CM | POA: Diagnosis not present

## 2021-01-09 DIAGNOSIS — I10 Essential (primary) hypertension: Secondary | ICD-10-CM | POA: Diagnosis not present

## 2021-01-09 DIAGNOSIS — Z87891 Personal history of nicotine dependence: Secondary | ICD-10-CM | POA: Diagnosis not present

## 2021-01-09 DIAGNOSIS — Z8546 Personal history of malignant neoplasm of prostate: Secondary | ICD-10-CM | POA: Diagnosis not present

## 2021-01-09 DIAGNOSIS — Z96642 Presence of left artificial hip joint: Secondary | ICD-10-CM | POA: Diagnosis not present

## 2021-01-10 ENCOUNTER — Encounter (HOSPITAL_COMMUNITY): Payer: Self-pay | Admitting: Orthopaedic Surgery

## 2021-01-12 DIAGNOSIS — Z87891 Personal history of nicotine dependence: Secondary | ICD-10-CM | POA: Diagnosis not present

## 2021-01-12 DIAGNOSIS — Z8546 Personal history of malignant neoplasm of prostate: Secondary | ICD-10-CM | POA: Diagnosis not present

## 2021-01-12 DIAGNOSIS — K219 Gastro-esophageal reflux disease without esophagitis: Secondary | ICD-10-CM | POA: Diagnosis not present

## 2021-01-12 DIAGNOSIS — Z96642 Presence of left artificial hip joint: Secondary | ICD-10-CM | POA: Diagnosis not present

## 2021-01-12 DIAGNOSIS — Z7982 Long term (current) use of aspirin: Secondary | ICD-10-CM | POA: Diagnosis not present

## 2021-01-12 DIAGNOSIS — E785 Hyperlipidemia, unspecified: Secondary | ICD-10-CM | POA: Diagnosis not present

## 2021-01-12 DIAGNOSIS — H409 Unspecified glaucoma: Secondary | ICD-10-CM | POA: Diagnosis not present

## 2021-01-12 DIAGNOSIS — I10 Essential (primary) hypertension: Secondary | ICD-10-CM | POA: Diagnosis not present

## 2021-01-12 DIAGNOSIS — Z471 Aftercare following joint replacement surgery: Secondary | ICD-10-CM | POA: Diagnosis not present

## 2021-01-14 DIAGNOSIS — Z87891 Personal history of nicotine dependence: Secondary | ICD-10-CM | POA: Diagnosis not present

## 2021-01-14 DIAGNOSIS — Z7982 Long term (current) use of aspirin: Secondary | ICD-10-CM | POA: Diagnosis not present

## 2021-01-14 DIAGNOSIS — Z8546 Personal history of malignant neoplasm of prostate: Secondary | ICD-10-CM | POA: Diagnosis not present

## 2021-01-14 DIAGNOSIS — Z96642 Presence of left artificial hip joint: Secondary | ICD-10-CM | POA: Diagnosis not present

## 2021-01-14 DIAGNOSIS — E785 Hyperlipidemia, unspecified: Secondary | ICD-10-CM | POA: Diagnosis not present

## 2021-01-14 DIAGNOSIS — Z471 Aftercare following joint replacement surgery: Secondary | ICD-10-CM | POA: Diagnosis not present

## 2021-01-14 DIAGNOSIS — I10 Essential (primary) hypertension: Secondary | ICD-10-CM | POA: Diagnosis not present

## 2021-01-14 DIAGNOSIS — K219 Gastro-esophageal reflux disease without esophagitis: Secondary | ICD-10-CM | POA: Diagnosis not present

## 2021-01-14 DIAGNOSIS — H409 Unspecified glaucoma: Secondary | ICD-10-CM | POA: Diagnosis not present

## 2021-01-16 DIAGNOSIS — Z87891 Personal history of nicotine dependence: Secondary | ICD-10-CM | POA: Diagnosis not present

## 2021-01-16 DIAGNOSIS — E785 Hyperlipidemia, unspecified: Secondary | ICD-10-CM | POA: Diagnosis not present

## 2021-01-16 DIAGNOSIS — I10 Essential (primary) hypertension: Secondary | ICD-10-CM | POA: Diagnosis not present

## 2021-01-16 DIAGNOSIS — Z471 Aftercare following joint replacement surgery: Secondary | ICD-10-CM | POA: Diagnosis not present

## 2021-01-16 DIAGNOSIS — Z96642 Presence of left artificial hip joint: Secondary | ICD-10-CM | POA: Diagnosis not present

## 2021-01-16 DIAGNOSIS — H409 Unspecified glaucoma: Secondary | ICD-10-CM | POA: Diagnosis not present

## 2021-01-16 DIAGNOSIS — K219 Gastro-esophageal reflux disease without esophagitis: Secondary | ICD-10-CM | POA: Diagnosis not present

## 2021-01-16 DIAGNOSIS — Z7982 Long term (current) use of aspirin: Secondary | ICD-10-CM | POA: Diagnosis not present

## 2021-01-16 DIAGNOSIS — Z8546 Personal history of malignant neoplasm of prostate: Secondary | ICD-10-CM | POA: Diagnosis not present

## 2021-01-21 DIAGNOSIS — Z7982 Long term (current) use of aspirin: Secondary | ICD-10-CM | POA: Diagnosis not present

## 2021-01-21 DIAGNOSIS — H409 Unspecified glaucoma: Secondary | ICD-10-CM | POA: Diagnosis not present

## 2021-01-21 DIAGNOSIS — Z87891 Personal history of nicotine dependence: Secondary | ICD-10-CM | POA: Diagnosis not present

## 2021-01-21 DIAGNOSIS — E785 Hyperlipidemia, unspecified: Secondary | ICD-10-CM | POA: Diagnosis not present

## 2021-01-21 DIAGNOSIS — Z96642 Presence of left artificial hip joint: Secondary | ICD-10-CM | POA: Diagnosis not present

## 2021-01-21 DIAGNOSIS — K219 Gastro-esophageal reflux disease without esophagitis: Secondary | ICD-10-CM | POA: Diagnosis not present

## 2021-01-21 DIAGNOSIS — Z471 Aftercare following joint replacement surgery: Secondary | ICD-10-CM | POA: Diagnosis not present

## 2021-01-21 DIAGNOSIS — I10 Essential (primary) hypertension: Secondary | ICD-10-CM | POA: Diagnosis not present

## 2021-01-21 DIAGNOSIS — Z8546 Personal history of malignant neoplasm of prostate: Secondary | ICD-10-CM | POA: Diagnosis not present

## 2021-01-22 ENCOUNTER — Encounter: Payer: Self-pay | Admitting: Orthopaedic Surgery

## 2021-01-22 ENCOUNTER — Ambulatory Visit (INDEPENDENT_AMBULATORY_CARE_PROVIDER_SITE_OTHER): Payer: Medicare HMO | Admitting: Physician Assistant

## 2021-01-22 ENCOUNTER — Other Ambulatory Visit: Payer: Self-pay

## 2021-01-22 DIAGNOSIS — Z96642 Presence of left artificial hip joint: Secondary | ICD-10-CM

## 2021-01-22 NOTE — Progress Notes (Signed)
Post-Op Visit Note   Patient: Joshua Compton.           Date of Birth: 16-Aug-1941           MRN: [id number] Visit Date: 01/22/2021 PCP: [name], MD   Assessment & Plan:  Chief Complaint:  Chief Complaint  Patient presents with  . Right Hip - Pain   Visit Diagnoses:  1. Status post total hip replacement, left     Plan: Patient is a pleasant 80 year old gentleman who comes in today 2 weeks out left total hip replacement.  He has been doing well.  He has 2 more visits to home health physical therapy.  He is ambulating with a cane.  He is taking very little narcotic pain medication.  Examination of his left hip reveals a well-healed surgical incision with nylon sutures in place.  No evidence of infection or cellulitis.  He does have a small seroma.  This is firm and nontender.  No calf pain.  He is neurovascular intact distally.  Today, sutures were removed and Steri-Strips applied.  Dental prophylaxis reinforced.  He will continue with his aspirin for DVT prophylaxis for another 4 weeks.  Follow-up with [place] in 4 weeks time for repeat evaluation AP pelvis x-rays.  Follow-Up Instructions: Return in about 4 weeks (around 02/19/2021).   Orders:  No orders of the defined types were placed in this encounter.  No orders of the defined types were placed in this encounter.   Imaging: No new imaging  PMFS History: Patient Active Problem List   Diagnosis Date Noted  . Status post total replacement of left hip 01/07/2021  . Primary osteoarthritis of left hip 01/06/2021  . Leg cramps 12/11/2020  . Thyromegaly 05/19/2017  . Neoplasm of scalp 12/08/2013  . Rhinitis 12/08/2013  . Cough 12/08/2013  . Hyperlipidemia   . Cancer ([place])   . Arthritis   . Severe stage glaucoma 03/26/2012  . Nuclear sclerosis 03/26/2012  . Primary open angle glaucoma 10/27/2011  . Essential hypertension 02/04/2011  . Degenerative disc disease 02/04/2011  . Gastrointestinal bleed 02/04/2011  .  Anemia 02/04/2011  . Prostate cancer ([place]) 02/04/2011   Past Medical History:  Diagnosis Date  . Arthritis   . Cancer [facility])    prostate ca   seed implants  . GERD (gastroesophageal reflux disease)   . Glaucoma   . Hyperlipidemia   . Hypertension    dr Jacelyn Grip     Willeen Niece   fm    Family History  Problem Relation Age of Onset  . Cancer Mother        breast  . Heart disease Father   . Cancer Brother   . Cancer Brother     Past Surgical History:  Procedure Laterality Date  . BACK SURGERY     x2  . COLONOSCOPY    . HERNIA REPAIR  11-06-2009   left inguinal repair   . NECK SURGERY    . ROTATOR CUFF REPAIR Left jan 2016  . TOTAL HIP ARTHROPLASTY Right 04/20/2020   Procedure: RIGHT TOTAL HIP ARTHROPLASTY ANTERIOR APPROACH;  Surgeon: Leandrew Koyanagi, MD;  Location: [place];  Service: Orthopedics;  Laterality: Right;  . TOTAL HIP ARTHROPLASTY Left 01/07/2021   Procedure: LEFT TOTAL HIP ARTHROPLASTY ANTERIOR APPROACH;  Surgeon: Leandrew Koyanagi, MD;  Location: [place];  Service: Orthopedics;  Laterality: Left;  . TRANSURETHRAL RESECTION OF PROSTATE     Social History   Occupational History  . Occupation: Tourist information centre manager  Engineering    Comment: retired  Tobacco Use  . Smoking status: Former Smoker    Packs/day: 0.50    Years: 30.00    Pack years: 15.00    Types: Cigarettes    Quit date: 03/28/2005    Years since quitting: 15.8  . Smokeless tobacco: Never Used  Vaping Use  . Vaping Use: Never used  Substance and Sexual Activity  . Alcohol use: No    Alcohol/week: 0.0 standard drinks  . Drug use: No  . Sexual activity: Yes

## 2021-01-24 DIAGNOSIS — Z87891 Personal history of nicotine dependence: Secondary | ICD-10-CM | POA: Diagnosis not present

## 2021-01-24 DIAGNOSIS — K219 Gastro-esophageal reflux disease without esophagitis: Secondary | ICD-10-CM | POA: Diagnosis not present

## 2021-01-24 DIAGNOSIS — E785 Hyperlipidemia, unspecified: Secondary | ICD-10-CM | POA: Diagnosis not present

## 2021-01-24 DIAGNOSIS — Z96642 Presence of left artificial hip joint: Secondary | ICD-10-CM | POA: Diagnosis not present

## 2021-01-24 DIAGNOSIS — I10 Essential (primary) hypertension: Secondary | ICD-10-CM | POA: Diagnosis not present

## 2021-01-28 DIAGNOSIS — H409 Unspecified glaucoma: Secondary | ICD-10-CM | POA: Diagnosis not present

## 2021-01-28 DIAGNOSIS — Z96642 Presence of left artificial hip joint: Secondary | ICD-10-CM | POA: Diagnosis not present

## 2021-01-28 DIAGNOSIS — E785 Hyperlipidemia, unspecified: Secondary | ICD-10-CM | POA: Diagnosis not present

## 2021-01-28 DIAGNOSIS — Z471 Aftercare following joint replacement surgery: Secondary | ICD-10-CM | POA: Diagnosis not present

## 2021-01-28 DIAGNOSIS — Z87891 Personal history of nicotine dependence: Secondary | ICD-10-CM | POA: Diagnosis not present

## 2021-01-28 DIAGNOSIS — K219 Gastro-esophageal reflux disease without esophagitis: Secondary | ICD-10-CM | POA: Diagnosis not present

## 2021-01-28 DIAGNOSIS — Z8546 Personal history of malignant neoplasm of prostate: Secondary | ICD-10-CM | POA: Diagnosis not present

## 2021-01-28 DIAGNOSIS — I10 Essential (primary) hypertension: Secondary | ICD-10-CM | POA: Diagnosis not present

## 2021-01-28 DIAGNOSIS — Z7982 Long term (current) use of aspirin: Secondary | ICD-10-CM | POA: Diagnosis not present

## 2021-01-31 ENCOUNTER — Telehealth: Payer: Self-pay | Admitting: Orthopaedic Surgery

## 2021-01-31 ENCOUNTER — Telehealth: Payer: Medicare HMO

## 2021-01-31 NOTE — Telephone Encounter (Signed)
Pt called and would like Lis to give him a call! He has some questions about what Dr.Xu and him talked about last time he was here.CB [phone]

## 2021-01-31 NOTE — Telephone Encounter (Signed)
Would like to go fishing per patient he said XU said last visit pt can go in 2 weeks.

## 2021-02-01 ENCOUNTER — Telehealth: Payer: Self-pay | Admitting: Orthopaedic Surgery

## 2021-02-01 NOTE — Telephone Encounter (Signed)
Spoke to [place]. States patient Didn't have a d/c visit today its scheduled for Monday someone will call him Sunday evening to schedule a time for Monday. Called patient he is aware.

## 2021-02-01 NOTE — Telephone Encounter (Signed)
FYI Spoke with patient he advised (PT) is suppose to come out to discharge him today and he do not know what time they are suppose to come. Patient said he need to leave and wanted to let (PT) know that he will not be at home and the visit will need to be rescheduled. The number to contact patient if needed is [phone]

## 2021-02-04 DIAGNOSIS — Z8546 Personal history of malignant neoplasm of prostate: Secondary | ICD-10-CM | POA: Diagnosis not present

## 2021-02-04 DIAGNOSIS — I10 Essential (primary) hypertension: Secondary | ICD-10-CM | POA: Diagnosis not present

## 2021-02-04 DIAGNOSIS — Z471 Aftercare following joint replacement surgery: Secondary | ICD-10-CM | POA: Diagnosis not present

## 2021-02-04 DIAGNOSIS — Z7982 Long term (current) use of aspirin: Secondary | ICD-10-CM | POA: Diagnosis not present

## 2021-02-04 DIAGNOSIS — Z96642 Presence of left artificial hip joint: Secondary | ICD-10-CM | POA: Diagnosis not present

## 2021-02-04 DIAGNOSIS — Z87891 Personal history of nicotine dependence: Secondary | ICD-10-CM | POA: Diagnosis not present

## 2021-02-04 DIAGNOSIS — K219 Gastro-esophageal reflux disease without esophagitis: Secondary | ICD-10-CM | POA: Diagnosis not present

## 2021-02-04 DIAGNOSIS — E785 Hyperlipidemia, unspecified: Secondary | ICD-10-CM | POA: Diagnosis not present

## 2021-02-04 DIAGNOSIS — H409 Unspecified glaucoma: Secondary | ICD-10-CM | POA: Diagnosis not present

## 2021-02-19 ENCOUNTER — Ambulatory Visit (INDEPENDENT_AMBULATORY_CARE_PROVIDER_SITE_OTHER): Payer: Medicare HMO

## 2021-02-19 ENCOUNTER — Encounter: Payer: Self-pay | Admitting: Orthopaedic Surgery

## 2021-02-19 ENCOUNTER — Ambulatory Visit: Payer: Medicare HMO | Admitting: Orthopaedic Surgery

## 2021-02-19 VITALS — Ht 72.0 in | Wt 205.0 lb

## 2021-02-19 DIAGNOSIS — Z96642 Presence of left artificial hip joint: Secondary | ICD-10-CM

## 2021-02-19 NOTE — Progress Notes (Signed)
Post-Op Visit Note   Patient: Joshua Compton.           Date of Birth: 1941/04/23           MRN: [id number] Visit Date: 02/19/2021 PCP: [name], MD   Assessment & Plan:  Chief Complaint:  Chief Complaint  Patient presents with  . Left Hip - Follow-up    Left total hip arthroplasty 01/07/2021   Visit Diagnoses:  1. Status post total hip replacement, left     Plan:   Joshua Compton is 6-week status post left total hip replacement dual mobility.  He is doing well reports no pain or complaints.  Not taking any pain medications.  He has returned back to fishing.  Left hip shows a healed surgical scar.  No signs of infection.  Minimal swelling.  X-rays demonstrate a total hip replacement without complications.  Joshua Compton is doing very well from his recent left hip replacement.  He will continue to increase activity as tolerated.  Dental prophylaxis reinforced.  He may discontinue DVT prophylaxis.  Follow-up in 6 weeks for recheck.  Follow-Up Instructions: Return in about 6 weeks (around 04/02/2021).   Orders:  Orders Placed This Encounter  Procedures  . XR Pelvis 1-2 Views   No orders of the defined types were placed in this encounter.   Imaging: XR Pelvis 1-2 Views  Result Date: 02/19/2021 Stable total hip replacement without complications   PMFS History: Patient Active Problem List   Diagnosis Date Noted  . Status post total replacement of left hip 01/07/2021  . Primary osteoarthritis of left hip 01/06/2021  . Leg cramps 12/11/2020  . Thyromegaly 05/19/2017  . Neoplasm of scalp 12/08/2013  . Rhinitis 12/08/2013  . Cough 12/08/2013  . Hyperlipidemia   . Cancer ([place])   . Arthritis   . Severe stage glaucoma 03/26/2012  . Nuclear sclerosis 03/26/2012  . Primary open angle glaucoma 10/27/2011  . Essential hypertension 02/04/2011  . Degenerative disc disease 02/04/2011  . Gastrointestinal bleed 02/04/2011  . Anemia 02/04/2011  . Prostate cancer ([place])  02/04/2011   Past Medical History:  Diagnosis Date  . Arthritis   . Cancer [facility])    prostate ca   seed implants  . GERD (gastroesophageal reflux disease)   . Glaucoma   . Hyperlipidemia   . Hypertension    dr Jacelyn Grip     Willeen Niece   fm    Family History  Problem Relation Age of Onset  . Cancer Mother        breast  . Heart disease Father   . Cancer Brother   . Cancer Brother     Past Surgical History:  Procedure Laterality Date  . BACK SURGERY     x2  . COLONOSCOPY    . HERNIA REPAIR  11-06-2009   left inguinal repair   . NECK SURGERY    . ROTATOR CUFF REPAIR Left jan 2016  . TOTAL HIP ARTHROPLASTY Right 04/20/2020   Procedure: RIGHT TOTAL HIP ARTHROPLASTY ANTERIOR APPROACH;  Surgeon: Leandrew Koyanagi, MD;  Location: [place];  Service: Orthopedics;  Laterality: Right;  . TOTAL HIP ARTHROPLASTY Left 01/07/2021   Procedure: LEFT TOTAL HIP ARTHROPLASTY ANTERIOR APPROACH;  Surgeon: Leandrew Koyanagi, MD;  Location: [place];  Service: Orthopedics;  Laterality: Left;  . TRANSURETHRAL RESECTION OF PROSTATE     Social History   Occupational History  . Occupation: Artist: retired  Tobacco Use  . Smoking status:  Former Smoker    Packs/day: 0.50    Years: 30.00    Pack years: 15.00    Types: Cigarettes    Quit date: 03/28/2005    Years since quitting: 15.9  . Smokeless tobacco: Never Used  Vaping Use  . Vaping Use: Never used  Substance and Sexual Activity  . Alcohol use: No    Alcohol/week: 0.0 standard drinks  . Drug use: No  . Sexual activity: Yes

## 2021-02-20 ENCOUNTER — Ambulatory Visit (INDEPENDENT_AMBULATORY_CARE_PROVIDER_SITE_OTHER): Payer: Medicare HMO | Admitting: Licensed Clinical Social Worker

## 2021-02-20 DIAGNOSIS — C61 Malignant neoplasm of prostate: Secondary | ICD-10-CM

## 2021-02-20 DIAGNOSIS — E782 Mixed hyperlipidemia: Secondary | ICD-10-CM

## 2021-02-20 DIAGNOSIS — D508 Other iron deficiency anemias: Secondary | ICD-10-CM | POA: Diagnosis not present

## 2021-02-20 DIAGNOSIS — I10 Essential (primary) hypertension: Secondary | ICD-10-CM | POA: Diagnosis not present

## 2021-02-20 NOTE — Patient Instructions (Signed)
Visit Information  PATIENT GOALS: Goals Addressed            This Visit's Progress   . Complete ADLs daily as able       Timeframe:  Short-Term Goal Priority:  Medium Progress: On Track Start Date:             02/20/21                Expected End Date:           05/22/21            Follow Up Date 03/27/21   Protect My Health (Patient)  Complete ADLs daily as able   Why is this important?    Screening tests can find diseases early when they are easier to treat.   Your doctor or nurse will talk with you about which tests are important for you.   Getting shots for common diseases like the flu and shingles will help prevent them.     Patient Coping Skills/Strengths: Has support from his wife, [name] Attends scheduled medical appointments Takes medications as prescribed  Patient Deficits: Pain issues Mobility issues  Patient Goals:  Patient will talk with LCSW in next 30 days to discuss client challenges in doing ADLs daily Patient will attend scheduled medical appointments in next 30 days Patient will allow time to rest and relax in next 30 days -  Follow Up Plan: LCSW to call client or spouse of client on 03/27/21       [name].Hannelore Bova MSW, LCSW Licensed Clinical Social Worker [facility] [place] Care Management [phone]

## 2021-02-20 NOTE — Chronic Care Management (AMB) (Signed)
Chronic Care Management    Clinical Social Work Note  02/20/2021 Name: Joshua Compton. MRN: [id number] DOB: [birth year]  Joshua Compton. is a 80 y.o. year old male who is a primary care patient of Compton, Joshua Gash, MD. The CCM team was consulted to assist the patient with chronic disease management and/or care coordination needs related to: [employer] .   Engaged with patient by telephone for follow up visit in response to provider referral for social work chronic care management and care coordination services.   Consent to Services:  The patient was given information about Chronic Care Management services, agreed to services, and gave verbal consent prior to initiation of services.  Please see initial visit note for detailed documentation.   Patient agreed to services and consent obtained.   Assessment: Review of patient past medical history, allergies, medications, and health status, including review of relevant consultants reports was performed today as part of a comprehensive evaluation and provision of chronic care management and care coordination services.     SDOH (Social Determinants of Health) assessments and interventions performed:    Advanced Directives Status: See Vynca application for related entries.  CCM Care Plan  Allergies  Allergen Reactions  . Pravastatin Sodium     Muscle aches    Outpatient Encounter Medications as of 02/20/2021  Medication Sig  . amoxicillin (AMOXIL) 500 MG capsule Take 4 pills one hour to dental procedure (Patient taking differently: Take 2,000 mg by mouth See admin instructions. Take 2000 1 hour prior to dental work)  . aspirin EC 81 MG tablet Take 1 tablet (81 mg total) by mouth 2 (two) times daily. To be taken after surgery  . docusate sodium (COLACE) 100 MG capsule Take 1 capsule (100 mg total) by mouth daily as needed.  . dorzolamide-timolol (COSOPT) 22.3-6.8 MG/ML ophthalmic solution Place 1 drop into both eyes daily.    . Enalapril-hydroCHLOROthiazide 5-12.5 MG tablet Take 1 tablet by mouth daily.  . felodipine (PLENDIL) 5 MG 24 hr tablet Take 1 tablet (5 mg total) by mouth daily. For blood pressure  . methocarbamol (ROBAXIN) 500 MG tablet Take 1 tablet (500 mg total) by mouth 2 (two) times daily as needed. To be taken after surgery  . mirtazapine (REMERON) 15 MG tablet Take 1 tablet (15 mg total) by mouth at bedtime. For sleep  . ondansetron (ZOFRAN) 4 MG tablet Take 1 tablet (4 mg total) by mouth every 8 (eight) hours as needed for nausea or vomiting.  Marland Kitchen oxyCODONE-acetaminophen (PERCOCET) 5-325 MG tablet Take 1-2 tablets by mouth every 6 (six) hours as needed. To be taken after surgery  . pantoprazole (PROTONIX) 40 MG tablet Take 1 tablet (40 mg total) by mouth daily. For heartburn  . rOPINIRole (REQUIP) 1 MG tablet Take 1 mg by mouth 2 (two) times daily.  . rosuvastatin (CRESTOR) 5 MG tablet Take 5 mg by mouth daily.   No facility-administered encounter medications on file as of 02/20/2021.    Patient Active Problem List   Diagnosis Date Noted  . Status post total replacement of left hip 01/07/2021  . Primary osteoarthritis of left hip 01/06/2021  . Leg cramps 12/11/2020  . Thyromegaly 05/19/2017  . Neoplasm of scalp 12/08/2013  . Rhinitis 12/08/2013  . Cough 12/08/2013  . Hyperlipidemia   . Cancer ([place])   . Arthritis   . Severe stage glaucoma 03/26/2012  . Nuclear sclerosis 03/26/2012  . Primary open angle glaucoma 10/27/2011  . Essential hypertension 02/04/2011  . Degenerative  disc disease 02/04/2011  . Gastrointestinal bleed 02/04/2011  . Anemia 02/04/2011  . Prostate cancer ([place]) 02/04/2011    Conditions to be addressed/monitored: Monitor client completion of ADLs  Care Plan : LCSW care plan  Updates made by Joshua Cabal, LCSW since 02/20/2021 12:00 AM    Problem: Functional Decline     Goal: complete ADLs daily as able   Start Date: 02/20/2021  Expected End Date: 05/22/2021  This  Visit's Progress: On track  Priority: Medium  Note:   Current barriers:   . Patient in need of assistance with connecting to community resources for possible help in completing ADLs for client and other daily activities of client . Patient is unable to independently navigate community resource options without care coordination support . Pain issues . Mobility issues  Clinical Goals:  patient will work with SW monthly to address concerns related to ADLs completion of client  Patient will call RNCM or LCSW as needed in next 30 days for CCM program support  Clinical Interventions:  . Collaboration with Joshua Fraise, MD regarding development and update of comprehensive plan of care as evidenced by provider attestation and co-signature . Assessment of needs, barriers of client .  Talked with client about pain issues of client . Talked with client about surgery in February of 2022 on left hip . Talked with client about ambulation of client . Talked with client about his upcoming medical appointments . Talked with client about medication procurement of client . Talked with client about relaxation techniques (fishes, plays golf) . Talked with client about family support (spouse is supportive) . Talked with client about transport needs of client  . Encouraged client to call RNCM as needed for nursing support  Patient Coping Skills/Strengths: Has support from his wife, Joshua Compton Attends scheduled medical appointments Takes medications as prescribed  Patient Deficits: Pain issues Mobility issues  Patient Goals:  Patient will talk with LCSW in next 30 days to discuss client challenges in doing ADLs daily Patient will attend scheduled medical appointments in next 30 days Patient will allow time to rest and relax in next 30 days -  Follow Up Plan: LCSW to call client or spouse of client on 03/27/21     Joshua Riffle.Mathew Compton MSW, LCSW Licensed Clinical Social Worker [facility] Care  Management [phone]

## 2021-02-27 ENCOUNTER — Other Ambulatory Visit: Payer: Self-pay | Admitting: Physician Assistant

## 2021-02-27 ENCOUNTER — Ambulatory Visit (INDEPENDENT_AMBULATORY_CARE_PROVIDER_SITE_OTHER): Payer: Medicare HMO | Admitting: Family Medicine

## 2021-02-27 ENCOUNTER — Encounter: Payer: Self-pay | Admitting: Family Medicine

## 2021-02-27 VITALS — BP 141/65 | HR 81 | Ht 72.0 in | Wt 203.0 lb

## 2021-02-27 DIAGNOSIS — J069 Acute upper respiratory infection, unspecified: Secondary | ICD-10-CM | POA: Diagnosis not present

## 2021-02-27 MED ORDER — FLUTICASONE PROPIONATE 50 MCG/ACT NA SUSP: 1.0000 | Freq: Two times a day (BID) | NASAL | 6 refills | Status: DC | PRN

## 2021-02-27 MED ORDER — HYDROCODONE-HOMATROPINE 5-1.5 MG/5ML PO SYRP: 5.0000 mL | ORAL_SOLUTION | Freq: Three times a day (TID) | ORAL | 0 refills | Status: DC | PRN

## 2021-02-27 NOTE — Progress Notes (Signed)
BP (!) 141/65   Pulse 81   Ht 6' (1.829 m)   Wt 203 lb (92.1 kg)   SpO2 93%   BMI 27.53 kg/m    Subjective:   Patient ID: Joshua Guiles., male    DOB: [birth year], 80 y.o.   MRN: [id number]  HPI: Joshua Noe. is a 80 y.o. male presenting on 02/27/2021 for URI (Cough and hoarse)   HPI Patient is coming today for cough and congestion respiratory issues.  He says been having issues over the past 3 days.  He denies any fevers or chills.  He does have a lot of postnasal drainage and sinus drainage that is been bothering him.  He denies any sick contacts that he knows of.  He denies any shortness of breath or wheezing.  He has not used anything over-the-counter for it as of yet.  He says in the past he was given some Hycodan cough syrup and it worked great and he got over it real quick.  Relevant past medical, surgical, family and social history reviewed and updated as indicated. Interim medical history since our last visit reviewed. Allergies and medications reviewed and updated.  Review of Systems  Constitutional: Negative for chills and fever.  HENT: Positive for congestion, postnasal drip, rhinorrhea and voice change. Negative for ear discharge, ear pain, sinus pressure, sneezing and sore throat.   Eyes: Negative for pain, discharge, redness and visual disturbance.  Respiratory: Positive for cough. Negative for shortness of breath and wheezing.   Cardiovascular: Negative for chest pain and leg swelling.  Musculoskeletal: Negative for back pain and gait problem.  Skin: Negative for rash.  All other systems reviewed and are negative.   Per HPI unless specifically indicated above   Allergies as of 02/27/2021      Reactions   Pravastatin Sodium    Muscle aches      Medication List       Accurate as of February 27, 2021  4:38 PM. If you have any questions, ask your nurse or doctor.        STOP taking these medications   amoxicillin 500 MG capsule Commonly known  as: AMOXIL Stopped by: Fransisca Kaufmann Chardonay Scritch[place], MD   docusate sodium 100 MG capsule Commonly known as: Colace Stopped by: Worthy Rancher, MD     TAKE these medications   aspirin EC 81 MG tablet Take 1 tablet (81 mg total) by mouth 2 (two) times daily. To be taken after surgery   dorzolamide-timolol 22.3-6.8 MG/ML ophthalmic solution Commonly known as: COSOPT Place 1 drop into both eyes daily.   Enalapril-hydroCHLOROthiazide 5-12.5 MG tablet Take 1 tablet by mouth daily.   felodipine 5 MG 24 hr tablet Commonly known as: PLENDIL Take 1 tablet (5 mg total) by mouth daily. For blood pressure   fluticasone 50 MCG/ACT nasal spray Commonly known as: FLONASE Place 1 spray into both nostrils 2 (two) times daily as needed for allergies or rhinitis. Started by: Worthy Rancher, MD   HYDROcodone-homatropine 5-1.5 MG/5ML syrup Commonly known as: HYCODAN Take 5 mLs by mouth every 8 (eight) hours as needed for cough. Started by: Worthy Rancher, MD   methocarbamol 500 MG tablet Commonly known as: Robaxin Take 1 tablet (500 mg total) by mouth 2 (two) times daily as needed. To be taken after surgery   mirtazapine 15 MG tablet Commonly known as: Remeron Take 1 tablet (15 mg total) by mouth at bedtime. For sleep   ondansetron 4 MG  tablet Commonly known as: Zofran Take 1 tablet (4 mg total) by mouth every 8 (eight) hours as needed for nausea or vomiting.   oxyCODONE-acetaminophen 5-325 MG tablet Commonly known as: Percocet Take 1-2 tablets by mouth every 6 (six) hours as needed. To be taken after surgery   pantoprazole 40 MG tablet Commonly known as: PROTONIX Take 1 tablet (40 mg total) by mouth daily. For heartburn   rOPINIRole 1 MG tablet Commonly known as: REQUIP Take 1 mg by mouth 2 (two) times daily.   rosuvastatin 5 MG tablet Commonly known as: CRESTOR Take 5 mg by mouth daily.        Objective:   BP (!) 141/65   Pulse 81   Ht 6' (1.829 m)   Wt 203 lb  (92.1 kg)   SpO2 93%   BMI 27.53 kg/m   Wt Readings from Last 3 Encounters:  02/27/21 203 lb (92.1 kg)  02/19/21 205 lb (93 kg)  01/07/21 205 lb (93 kg)    Physical Exam Vitals and nursing note reviewed.  Constitutional:      General: He is not in acute distress.    Appearance: He is well-developed. He is not diaphoretic.  HENT:     Right Ear: Tympanic membrane normal.     Left Ear: Tympanic membrane normal.     Nose: Nose normal.     Mouth/Throat:     Mouth: Mucous membranes are moist.     Pharynx: No oropharyngeal exudate or posterior oropharyngeal erythema.  Eyes:     General: No scleral icterus.    Conjunctiva/sclera: Conjunctivae normal.  Neck:     Thyroid: No thyromegaly.  Cardiovascular:     Rate and Rhythm: Normal rate and regular rhythm.     Heart sounds: Normal heart sounds. No murmur heard.   Pulmonary:     Effort: Pulmonary effort is normal. No respiratory distress.     Breath sounds: Normal breath sounds. No wheezing.  Musculoskeletal:        General: Normal range of motion.     Cervical back: Neck supple.  Lymphadenopathy:     Cervical: No cervical adenopathy.  Skin:    General: Skin is warm and dry.     Findings: No rash.  Neurological:     Mental Status: He is alert and oriented to person, place, and time.     Coordination: Coordination normal.  Psychiatric:        Behavior: Behavior normal.       Assessment & Plan:   Problem List Items Addressed This Visit   None   Visit Diagnoses    Upper respiratory tract infection, unspecified type    -  Primary   Relevant Medications   HYDROcodone-homatropine (HYCODAN) 5-1.5 MG/5ML syrup   fluticasone (FLONASE) 50 MCG/ACT nasal spray   Other Relevant Orders   Novel Coronavirus, NAA ([facility])      Gave cough syrup and Flonase, likely allergic related call back if not improved. Follow up plan: Return if symptoms worsen or fail to improve.  Counseling provided for all of the vaccine  components Orders Placed This Encounter  Procedures  . Novel Coronavirus, NAA ([facility])    Caryl Pina, MD [facility] 02/27/2021, 4:38 PM

## 2021-02-27 NOTE — Addendum Note (Signed)
Addended by: Collier Bullock on: 02/27/2021 04:44 PM   Modules accepted: Orders

## 2021-02-28 LAB — NOVEL CORONAVIRUS, NAA: SARS-CoV-2, NAA: NOT DETECTED

## 2021-02-28 LAB — SARS-COV-2, NAA 2 DAY TAT

## 2021-03-05 DIAGNOSIS — H401112 Primary open-angle glaucoma, right eye, moderate stage: Secondary | ICD-10-CM | POA: Diagnosis not present

## 2021-03-05 DIAGNOSIS — H524 Presbyopia: Secondary | ICD-10-CM | POA: Diagnosis not present

## 2021-03-05 DIAGNOSIS — H2511 Age-related nuclear cataract, right eye: Secondary | ICD-10-CM | POA: Diagnosis not present

## 2021-03-05 DIAGNOSIS — H401123 Primary open-angle glaucoma, left eye, severe stage: Secondary | ICD-10-CM | POA: Diagnosis not present

## 2021-03-05 LAB — HM DIABETES EYE EXAM

## 2021-03-27 ENCOUNTER — Telehealth: Payer: Medicare HMO

## 2021-04-02 ENCOUNTER — Ambulatory Visit: Payer: Medicare HMO | Admitting: Orthopaedic Surgery

## 2021-04-19 ENCOUNTER — Telehealth: Payer: Self-pay | Admitting: *Deleted

## 2021-04-19 NOTE — Telephone Encounter (Signed)
Ortho bundle 1 year call completed. ?

## 2021-05-08 ENCOUNTER — Telehealth: Payer: Medicare HMO

## 2021-05-27 ENCOUNTER — Other Ambulatory Visit: Payer: Self-pay | Admitting: Family Medicine

## 2021-06-10 ENCOUNTER — Other Ambulatory Visit: Payer: Self-pay | Admitting: Family Medicine

## 2021-06-10 DIAGNOSIS — I10 Essential (primary) hypertension: Secondary | ICD-10-CM

## 2021-06-10 DIAGNOSIS — K219 Gastro-esophageal reflux disease without esophagitis: Secondary | ICD-10-CM

## 2021-06-11 ENCOUNTER — Ambulatory Visit (INDEPENDENT_AMBULATORY_CARE_PROVIDER_SITE_OTHER): Payer: Medicare HMO | Admitting: Family Medicine

## 2021-06-11 ENCOUNTER — Other Ambulatory Visit: Payer: Self-pay

## 2021-06-11 ENCOUNTER — Encounter: Payer: Self-pay | Admitting: Family Medicine

## 2021-06-11 VITALS — BP 140/65 | HR 61 | Temp 97.6°F | Ht 72.0 in | Wt 200.8 lb

## 2021-06-11 DIAGNOSIS — E010 Iodine-deficiency related diffuse (endemic) goiter: Secondary | ICD-10-CM

## 2021-06-11 DIAGNOSIS — K219 Gastro-esophageal reflux disease without esophagitis: Secondary | ICD-10-CM

## 2021-06-11 DIAGNOSIS — R252 Cramp and spasm: Secondary | ICD-10-CM | POA: Diagnosis not present

## 2021-06-11 DIAGNOSIS — I10 Essential (primary) hypertension: Secondary | ICD-10-CM

## 2021-06-11 DIAGNOSIS — E782 Mixed hyperlipidemia: Secondary | ICD-10-CM | POA: Diagnosis not present

## 2021-06-11 MED ORDER — ROPINIROLE HCL 1 MG PO TABS: 1.0000 mg | ORAL_TABLET | Freq: Two times a day (BID) | ORAL | 5 refills | Status: DC

## 2021-06-11 MED ORDER — ENALAPRIL-HYDROCHLOROTHIAZIDE 5-12.5 MG PO TABS: 1.0000 | ORAL_TABLET | Freq: Every day | ORAL | 3 refills | Status: DC

## 2021-06-11 MED ORDER — BELSOMRA 10 MG PO TABS: 10.0000 mg | ORAL_TABLET | Freq: Every day | ORAL | 2 refills | Status: DC

## 2021-06-11 MED ORDER — FELODIPINE ER 5 MG PO TB24: ORAL_TABLET | ORAL | 3 refills | Status: DC

## 2021-06-11 MED ORDER — PANTOPRAZOLE SODIUM 40 MG PO TBEC: 40.0000 mg | DELAYED_RELEASE_TABLET | Freq: Every day | ORAL | 3 refills | Status: DC

## 2021-06-11 NOTE — Progress Notes (Signed)
Subjective:  Patient ID: Joshua Guiles., male    DOB: 1941/05/03  Age: 80 y.o. MRN: [id number]  CC: Medical Management of Chronic Issues   HPI Joshua Compton. presents for  follow-up of hypertension. Patient has no history of headache chest pain or shortness of breath or recent cough. Patient also denies symptoms of TIA such as focal numbness or weakness. Patient denies side effects from medication. States taking it regularly. BP "Super fine" at home.   Not sleeping well. Mirtazapine not helping.  Some of this is attributed to the fact that he wakes up with cramps during the night.  He no longer has the ropinirole.  He thinks it helped when he had he would like to try again.  Patient in for follow-up of GERD. Currently asymptomatic taking  PPI daily. There is no chest pain or heartburn. No hematemesis and no melena. No dysphagia or choking. Onset is remote. Progression is stable. Complicating factors, none.  Patient in for follow-up of elevated cholesterol. Doing well without complaints on current medication. Denies side effects of statin including myalgia and arthralgia and nausea. Also in today for liver function testing. Currently no chest pain, shortness of breath or other cardiovascular related symptoms noted.  History Joshua Compton has a past medical history of Arthritis, Cancer ([place]), GERD (gastroesophageal reflux disease), Glaucoma, Hyperlipidemia, and Hypertension.   He has a past surgical history that includes Back surgery; Neck surgery; Transurethral resection of prostate; Hernia repair (11-06-2009); Rotator cuff repair (Left, jan 2016); Colonoscopy; Total hip arthroplasty (Right, 04/20/2020); and Total hip arthroplasty (Left, 01/07/2021).   His family history includes Cancer in his brother, brother, and mother; Heart disease in his father.He reports that he quit smoking about 16 years ago. His smoking use included cigarettes. He has a 15.00 pack-year smoking history. He has  never used smokeless tobacco. He reports that he does not drink alcohol and does not use drugs.  Current Outpatient Medications on File Prior to Visit  Medication Sig Dispense Refill   aspirin EC 81 MG tablet Take 1 tablet (81 mg total) by mouth 2 (two) times daily. To be taken after surgery 84 tablet 0   dorzolamide-timolol (COSOPT) 22.3-6.8 MG/ML ophthalmic solution Place 1 drop into both eyes daily.      fluticasone (FLONASE) 50 MCG/ACT nasal spray Place 1 spray into both nostrils 2 (two) times daily as needed for allergies or rhinitis. 16 g 6   ondansetron (ZOFRAN) 4 MG tablet Take 1 tablet (4 mg total) by mouth every 8 (eight) hours as needed for nausea or vomiting. 40 tablet 0   rosuvastatin (CRESTOR) 5 MG tablet Take 1 tablet (5 mg total) by mouth daily. 90 tablet 3   No current facility-administered medications on file prior to visit.    ROS Review of Systems  Constitutional: Negative.   HENT: Negative.    Eyes:  Negative for visual disturbance.  Respiratory:  Negative for cough and shortness of breath.   Cardiovascular:  Negative for chest pain and leg swelling.  Gastrointestinal:  Negative for abdominal pain, diarrhea, nausea and vomiting.  Genitourinary:  Negative for difficulty urinating.  Musculoskeletal:  Negative for arthralgias and myalgias.  Skin:  Negative for rash.  Neurological:  Negative for headaches.  Psychiatric/Behavioral:  Negative for sleep disturbance.    Objective:  BP 140/65   Pulse 61   Temp 97.6 F (36.4 C)   Ht 6' (1.829 m)   Wt 200 lb 12.8 oz (91.1 kg)   SpO2 94%  BMI 27.23 kg/m   BP Readings from Last 3 Encounters:  06/11/21 140/65  02/27/21 (!) 141/65  01/08/21 137/67    Wt Readings from Last 3 Encounters:  06/11/21 200 lb 12.8 oz (91.1 kg)  02/27/21 203 lb (92.1 kg)  02/19/21 205 lb (93 kg)     Physical Exam Constitutional:      General: He is not in acute distress.    Appearance: He is well-developed.  HENT:     Head:  Normocephalic and atraumatic.     Right Ear: External ear normal.     Left Ear: External ear normal.     Nose: Nose normal.  Eyes:     Conjunctiva/sclera: Conjunctivae normal.     Pupils: Pupils are equal, round, and reactive to light.  Cardiovascular:     Rate and Rhythm: Normal rate and regular rhythm.     Heart sounds: Normal heart sounds. No murmur heard. Pulmonary:     Effort: Pulmonary effort is normal. No respiratory distress.     Breath sounds: Normal breath sounds. No wheezing or rales.  Abdominal:     Palpations: Abdomen is soft.     Tenderness: There is no abdominal tenderness.  Musculoskeletal:        General: Normal range of motion.     Cervical back: Normal range of motion and neck supple.  Skin:    General: Skin is warm and dry.  Neurological:     Mental Status: He is alert and oriented to person, place, and time.     Deep Tendon Reflexes: Reflexes are normal and symmetric.  Psychiatric:        Behavior: Behavior normal.        Thought Content: Thought content normal.        Judgment: Judgment normal.      Assessment & Plan:   Joshua Compton was seen today for medical management of chronic issues.  Diagnoses and all orders for this visit:  Mixed hyperlipidemia -     Lipid panel  Essential hypertension -     CBC with Differential/Platelet -     CMP14+EGFR -     Enalapril-hydroCHLOROthiazide 5-12.5 MG tablet; Take 1 tablet by mouth daily. Take 1 tablet by mouth daily. -     felodipine (PLENDIL) 5 MG 24 hr tablet; TAKE 1 TABLET BY MOUTH EVERY DAY FOR BLOOD PRESSURE  Gastroesophageal reflux disease without esophagitis -     pantoprazole (PROTONIX) 40 MG tablet; Take 1 tablet (40 mg total) by mouth daily. For heartburn  Leg cramps -     Magnesium -     Phosphorus  Thyromegaly -     US SOFT TISSUE HEAD & NECK (NON-THYROID); Future  Other orders -     rOPINIRole (REQUIP) 1 MG tablet; Take 1 tablet (1 mg total) by mouth 2 (two) times daily. -      Suvorexant (BELSOMRA) 10 MG TABS; Take 10 mg by mouth at bedtime.  Allergies as of 06/11/2021       Reactions   Pravastatin Sodium    Muscle aches        Medication List        Accurate as of June 11, 2021  5:15 PM. If you have any questions, ask your nurse or doctor.          STOP taking these medications    HYDROcodone-homatropine 5-1.5 MG/5ML syrup Commonly known as: HYCODAN Stopped by: [name], MD   methocarbamol 500 MG tablet Commonly known as:  Robaxin Stopped by: [name], MD   mirtazapine 15 MG tablet Commonly known as: Remeron Stopped by: [name], MD   oxyCODONE-acetaminophen 5-325 MG tablet Commonly known as: Percocet Stopped by: [name], MD       TAKE these medications    aspirin EC 81 MG tablet Take 1 tablet (81 mg total) by mouth 2 (two) times daily. To be taken after surgery   Belsomra 10 MG Tabs Generic drug: Suvorexant Take 10 mg by mouth at bedtime. Started by: [name], MD   dorzolamide-timolol 22.3-6.8 MG/ML ophthalmic solution Commonly known as: COSOPT Place 1 drop into both eyes daily.   Enalapril-hydroCHLOROthiazide 5-12.5 MG tablet Take 1 tablet by mouth daily. Take 1 tablet by mouth daily. What changed: additional instructions Changed by: [name], MD   felodipine 5 MG 24 hr tablet Commonly known as: PLENDIL TAKE 1 TABLET BY MOUTH EVERY DAY FOR BLOOD PRESSURE   fluticasone 50 MCG/ACT nasal spray Commonly known as: FLONASE Place 1 spray into both nostrils 2 (two) times daily as needed for allergies or rhinitis.   ondansetron 4 MG tablet Commonly known as: Zofran Take 1 tablet (4 mg total) by mouth every 8 (eight) hours as needed for nausea or vomiting.   pantoprazole 40 MG tablet Commonly known as: PROTONIX Take 1 tablet (40 mg total) by mouth daily. For heartburn   rOPINIRole 1 MG tablet Commonly known as: REQUIP Take 1 tablet (1 mg total) by mouth 2 (two) times daily.    rosuvastatin 5 MG tablet Commonly known as: CRESTOR Take 1 tablet (5 mg total) by mouth daily.        Meds ordered this encounter  Medications   Enalapril-hydroCHLOROthiazide 5-12.5 MG tablet    Sig: Take 1 tablet by mouth daily. Take 1 tablet by mouth daily.    Dispense:  90 tablet    Refill:  3   felodipine (PLENDIL) 5 MG 24 hr tablet    Sig: TAKE 1 TABLET BY MOUTH EVERY DAY FOR BLOOD PRESSURE    Dispense:  90 tablet    Refill:  3   pantoprazole (PROTONIX) 40 MG tablet    Sig: Take 1 tablet (40 mg total) by mouth daily. For heartburn    Dispense:  90 tablet    Refill:  3   rOPINIRole (REQUIP) 1 MG tablet    Sig: Take 1 tablet (1 mg total) by mouth 2 (two) times daily.    Dispense:  60 tablet    Refill:  5   Suvorexant (BELSOMRA) 10 MG TABS    Sig: Take 10 mg by mouth at bedtime.    Dispense:  30 tablet    Refill:  2      Follow-up: Return in about 6 weeks (around 07/23/2021).  [name], M.D.

## 2021-06-12 LAB — LIPID PANEL
Chol/HDL Ratio: 2.6 ratio (ref 0.0–5.0)
Cholesterol, Total: 153 mg/dL (ref 100–199)
HDL: 58 mg/dL (ref >39)
LDL Chol Calc (NIH): 75 mg/dL (ref 0–99)
Triglycerides: 112 mg/dL (ref 0–149)
VLDL Cholesterol Cal: 20 mg/dL (ref 5–40)

## 2021-06-12 LAB — MAGNESIUM: Magnesium: 1.8 mg/dL (ref 1.6–2.3)

## 2021-06-12 LAB — CMP14+EGFR
ALT: 17 IU/L (ref 0–44)
AST: 23 IU/L (ref 0–40)
Albumin/Globulin Ratio: 1.4 (ref 1.2–2.2)
Albumin: 4.2 g/dL (ref 3.7–4.7)
Alkaline Phosphatase: 97 IU/L (ref 44–121)
BUN/Creatinine Ratio: 13 (ref 10–24)
BUN: 18 mg/dL (ref 8–27)
Bilirubin Total: 0.5 mg/dL (ref 0.0–1.2)
CO2: 25 mmol/L (ref 20–29)
Calcium: 9.6 mg/dL (ref 8.6–10.2)
Chloride: 104 mmol/L (ref 96–106)
Creatinine, Ser: 1.43 mg/dL — ABNORMAL HIGH (ref 0.76–1.27)
Globulin, Total: 2.9 g/dL (ref 1.5–4.5)
Glucose: 93 mg/dL (ref 65–99)
Potassium: 4.1 mmol/L (ref 3.5–5.2)
Sodium: 141 mmol/L (ref 134–144)
Total Protein: 7.1 g/dL (ref 6.0–8.5)
eGFR: 50 mL/min/{1.73_m2} — ABNORMAL LOW (ref >59)

## 2021-06-12 LAB — CBC WITH DIFFERENTIAL/PLATELET
Basophils Absolute: 0.1 10*3/uL (ref 0.0–0.2)
Basos: 2 %
EOS (ABSOLUTE): 0.3 10*3/uL (ref 0.0–0.4)
Eos: 5 %
Hematocrit: 42.4 % (ref 37.5–51.0)
Hemoglobin: 14.4 g/dL (ref 13.0–17.7)
Immature Grans (Abs): 0 10*3/uL (ref 0.0–0.1)
Immature Granulocytes: 0 %
Lymphocytes Absolute: 2.5 10*3/uL (ref 0.7–3.1)
Lymphs: 42 %
MCH: 30.1 pg (ref 26.6–33.0)
MCHC: 34 g/dL (ref 31.5–35.7)
MCV: 89 fL (ref 79–97)
Monocytes Absolute: 0.5 10*3/uL (ref 0.1–0.9)
Monocytes: 9 %
Neutrophils Absolute: 2.4 10*3/uL (ref 1.4–7.0)
Neutrophils: 42 %
Platelets: 204 10*3/uL (ref 150–450)
RBC: 4.79 x10E6/uL (ref 4.14–5.80)
RDW: 13.7 % (ref 11.6–15.4)
WBC: 5.8 10*3/uL (ref 3.4–10.8)

## 2021-06-12 LAB — PHOSPHORUS: Phosphorus: 3.7 mg/dL (ref 2.8–4.1)

## 2021-06-19 ENCOUNTER — Ambulatory Visit (HOSPITAL_COMMUNITY)
Admission: RE | Admit: 2021-06-19 | Discharge: 2021-06-19 | Disposition: A | Discharge status: Home / Self Care | Payer: Medicare HMO | Source: Ambulatory Visit | Attending: Family Medicine | Admitting: Family Medicine

## 2021-06-19 ENCOUNTER — Other Ambulatory Visit: Payer: Self-pay

## 2021-06-19 DIAGNOSIS — E010 Iodine-deficiency related diffuse (endemic) goiter: Secondary | ICD-10-CM | POA: Insufficient documentation

## 2021-06-19 DIAGNOSIS — E042 Nontoxic multinodular goiter: Secondary | ICD-10-CM | POA: Diagnosis not present

## 2021-06-21 ENCOUNTER — Telehealth: Payer: Medicare HMO

## 2021-07-23 ENCOUNTER — Ambulatory Visit: Payer: Medicare HMO | Admitting: Family Medicine

## 2021-07-24 ENCOUNTER — Encounter: Payer: Self-pay | Admitting: Family Medicine

## 2021-08-01 ENCOUNTER — Ambulatory Visit (INDEPENDENT_AMBULATORY_CARE_PROVIDER_SITE_OTHER): Payer: Medicare HMO | Admitting: Licensed Clinical Social Worker

## 2021-08-01 DIAGNOSIS — E782 Mixed hyperlipidemia: Secondary | ICD-10-CM

## 2021-08-01 DIAGNOSIS — I10 Essential (primary) hypertension: Secondary | ICD-10-CM

## 2021-08-01 DIAGNOSIS — C61 Malignant neoplasm of prostate: Secondary | ICD-10-CM

## 2021-08-01 DIAGNOSIS — D508 Other iron deficiency anemias: Secondary | ICD-10-CM

## 2021-08-01 NOTE — Chronic Care Management (AMB) (Signed)
Chronic Care Management    Clinical Social Work Note  08/01/2021 Name: Joshua Compton. MRN: [id number] DOB: 12-Feb-1941  Joshua Putnam. is a 80 y.o. year old male who is a primary care patient of Stacks, Cletus Gash, MD. The CCM team was consulted to assist the patient with chronic disease management and/or care coordination needs related to: [employer] .   Engaged with patient / spouse of patient, Joshua Compton, by telephone for follow up visit in response to provider referral for social work chronic care management and care coordination services.   Consent to Services:  The patient was given information about Chronic Care Management services, agreed to services, and gave verbal consent prior to initiation of services.  Please see initial visit note for detailed documentation.   Patient agreed to services and consent obtained.   Assessment: Review of patient past medical history, allergies, medications, and health status, including review of relevant consultants reports was performed today as part of a comprehensive evaluation and provision of chronic care management and care coordination services.     SDOH (Social Determinants of Health) assessments and interventions performed:  SDOH Interventions    Flowsheet Row Most Recent Value  SDOH Interventions   Physical Activity Interventions Other (Comments)  [client has OA, client has leg cramps, client has arthritis]  Depression Interventions/Treatment  --  [informed Joshua Compton, spouse of client, of LCSW support for client and of RNCM support for client]        Advanced Directives Status: See [employer] application for related entries.  CCM Care Plan  Allergies  Allergen Reactions   Pravastatin Sodium     Muscle aches    Outpatient Encounter Medications as of 08/01/2021  Medication Sig   aspirin EC 81 MG tablet Take 1 tablet (81 mg total) by mouth 2 (two) times daily. To be taken after surgery    dorzolamide-timolol (COSOPT) 22.3-6.8 MG/ML ophthalmic solution Place 1 drop into both eyes daily.    Enalapril-hydroCHLOROthiazide 5-12.5 MG tablet Take 1 tablet by mouth daily. Take 1 tablet by mouth daily.   felodipine (PLENDIL) 5 MG 24 hr tablet TAKE 1 TABLET BY MOUTH EVERY DAY FOR BLOOD PRESSURE   fluticasone (FLONASE) 50 MCG/ACT nasal spray Place 1 spray into both nostrils 2 (two) times daily as needed for allergies or rhinitis.   ondansetron (ZOFRAN) 4 MG tablet Take 1 tablet (4 mg total) by mouth every 8 (eight) hours as needed for nausea or vomiting.   pantoprazole (PROTONIX) 40 MG tablet Take 1 tablet (40 mg total) by mouth daily. For heartburn   rOPINIRole (REQUIP) 1 MG tablet Take 1 tablet (1 mg total) by mouth 2 (two) times daily.   rosuvastatin (CRESTOR) 5 MG tablet Take 1 tablet (5 mg total) by mouth daily.   Suvorexant (BELSOMRA) 10 MG TABS Take 10 mg by mouth at bedtime.   No facility-administered encounter medications on file as of 08/01/2021.    Patient Active Problem List   Diagnosis Date Noted   Status post total replacement of left hip 01/07/2021   Primary osteoarthritis of left hip 01/06/2021   Leg cramps 12/11/2020   Thyromegaly 05/19/2017   Neoplasm of scalp 12/08/2013   Rhinitis 12/08/2013   Cough 12/08/2013   Hyperlipidemia    Cancer ([place])    Arthritis    Severe stage glaucoma 03/26/2012   Nuclear sclerosis 03/26/2012   Primary open angle glaucoma 10/27/2011   Essential hypertension 02/04/2011   Degenerative disc disease 02/04/2011   Gastrointestinal bleed 02/04/2011  Anemia 02/04/2011   Prostate cancer ([place]) 02/04/2011    Conditions to be addressed/monitored: monitor client completion of ADLs  Care Plan : LCSW care plan  Updates made by Katha Cabal, LCSW since 08/01/2021 12:00 AM     Problem: Functional Decline      Goal: complete ADLs daily as able   Start Date: 08/01/2021  Expected End Date: 10/29/2021  This Visit's Progress: On track   Recent Progress: On track  Priority: Medium  Note:   Current barriers:   Patient in need of assistance with connecting to community resources for possible help in completing ADLs for client and other daily activities of client Patient is unable to independently navigate community resource options without care coordination support Pain issues Mobility issues  Clinical Goals:  patient will work with SW monthly to address concerns related to ADLs completion of client  Patient will call RNCM or LCSW as needed in next 30 days for CCM program support  Clinical Interventions:  Collaboration with [name], MD regarding development and update of comprehensive plan of care as evidenced by provider attestation and co-signature Assessment of needs, barriers of client  Talked with Joshua Compton, spouse of client about pain issues of client Talked with Joshua Compton about ambulation of client Talked with Joshua Compton about client upcoming medical appointments Talked with Joshua Compton about medication procurement of client Talked with Joshua Compton about relaxation techniques for client  (client likes to fish and client likes to play golf) Talked with Joshua Compton about transport needs of client  Encouraged client  or Joshua Compton to call [facility] as needed for nursing support for client Talked with Joshua Compton about socialization of client. She said client likes to go on fishing activities with his brothers.  Patient Coping Skills/Strengths: Has support from his wife, Joshua Compton Attends scheduled medical appointments Takes medications as prescribed  Patient Deficits: Pain issues Mobility issues  Patient Goals:  Patient will talk with LCSW in next 30 days to discuss client challenges in doing ADLs daily Patient will attend scheduled medical appointments in next 30 days Patient will allow time to rest and relax in next 30 days -  Follow Up Plan: LCSW to call client or spouse of client on 09/13/21 at 11:15 AM to  assess needs of client       [name].Liyanna Cartwright MSW, LCSW Licensed Clinical Social Worker [facility] Care Management [phone]

## 2021-08-01 NOTE — Patient Instructions (Signed)
Visit Information  PATIENT GOALS:  Goals Addressed             This Visit's Progress    Complete ADLs daily as able       Timeframe:  Short-Term Goal Priority:  Medium Progress: On Track Start Date:           08/01/21                Expected End Date:         10/29/21            Follow Up Date 09/13/21 at 11:15 AM   Protect My Health (Patient)  Complete ADLs daily as able   Why is this important?   Screening tests can find diseases early when they are easier to treat.  Your doctor or nurse will talk with you about which tests are important for you.  Getting shots for common diseases like the flu and shingles will help prevent them.     Patient Coping Skills/Strengths: Has support from his wife, [name] Attends scheduled medical appointments Takes medications as prescribed  Patient Deficits: Pain issues Mobility issues  Patient Goals:  Patient will talk with LCSW in next 30 days to discuss client challenges in doing ADLs daily Patient will attend scheduled medical appointments in next 30 days Patient will allow time to rest and relax in next 30 days -  Follow Up Plan: LCSW to call client or spouse of client on 09/13/21 at 11:15 AM to assess client needs             [name].Maleea Camilo MSW, LCSW Licensed Clinical Social Worker [facility] Care Management [phone]

## 2021-08-16 DIAGNOSIS — D508 Other iron deficiency anemias: Secondary | ICD-10-CM | POA: Diagnosis not present

## 2021-08-16 DIAGNOSIS — E782 Mixed hyperlipidemia: Secondary | ICD-10-CM

## 2021-08-16 DIAGNOSIS — C61 Malignant neoplasm of prostate: Secondary | ICD-10-CM | POA: Diagnosis not present

## 2021-08-16 DIAGNOSIS — I10 Essential (primary) hypertension: Secondary | ICD-10-CM | POA: Diagnosis not present

## 2021-09-05 DIAGNOSIS — H401123 Primary open-angle glaucoma, left eye, severe stage: Secondary | ICD-10-CM | POA: Diagnosis not present

## 2021-09-05 DIAGNOSIS — H2511 Age-related nuclear cataract, right eye: Secondary | ICD-10-CM | POA: Diagnosis not present

## 2021-09-05 DIAGNOSIS — H401112 Primary open-angle glaucoma, right eye, moderate stage: Secondary | ICD-10-CM | POA: Diagnosis not present

## 2021-09-10 ENCOUNTER — Ambulatory Visit (INDEPENDENT_AMBULATORY_CARE_PROVIDER_SITE_OTHER): Payer: Medicare HMO | Admitting: Family Medicine

## 2021-09-10 ENCOUNTER — Other Ambulatory Visit: Payer: Self-pay

## 2021-09-10 ENCOUNTER — Encounter: Payer: Self-pay | Admitting: Family Medicine

## 2021-09-10 VITALS — BP 137/56 | HR 67 | Temp 97.3°F | Ht 72.0 in | Wt 201.6 lb

## 2021-09-10 DIAGNOSIS — I10 Essential (primary) hypertension: Secondary | ICD-10-CM

## 2021-09-10 DIAGNOSIS — E782 Mixed hyperlipidemia: Secondary | ICD-10-CM

## 2021-09-10 DIAGNOSIS — K2100 Gastro-esophageal reflux disease with esophagitis, without bleeding: Secondary | ICD-10-CM | POA: Insufficient documentation

## 2021-09-10 DIAGNOSIS — R252 Cramp and spasm: Secondary | ICD-10-CM

## 2021-09-10 DIAGNOSIS — N529 Male erectile dysfunction, unspecified: Secondary | ICD-10-CM | POA: Diagnosis not present

## 2021-09-10 DIAGNOSIS — F5101 Primary insomnia: Secondary | ICD-10-CM | POA: Insufficient documentation

## 2021-09-10 DIAGNOSIS — R69 Illness, unspecified: Secondary | ICD-10-CM | POA: Diagnosis not present

## 2021-09-10 MED ORDER — SILDENAFIL CITRATE 20 MG PO TABS: ORAL_TABLET | ORAL | 5 refills | Status: DC

## 2021-09-10 NOTE — Progress Notes (Signed)
Subjective:  Patient ID: Joshua Compton., male    DOB: 1941/04/23  Age: 80 y.o. MRN: [id number]  CC: Medical Management of Chronic Issues   HPI Joshua Compton. presents for  follow-up of hypertension. Patient has no history of headache chest pain or shortness of breath or recent cough. Patient also denies symptoms of TIA such as focal numbness or weakness. Patient denies side effects from medication. States taking it regularly.  Patient in for follow-up of GERD. Currently asymptomatic taking  PPI daily. There is no chest pain or heartburn. No hematemesis and no melena. No dysphagia or choking. Onset is remote. Progression is stable. Complicating factors, none.   in for follow-up of elevated cholesterol. Doing well without complaints on current medication. Denies side effects of statin including myalgia and arthralgia and nausea. Currently no chest pain, shortness of breath or other cardiovascular related symptoms noted.  Pt. Notsleeping well. 4-5 hours a night, interrupted several times.Not sure, but says the [place] didn't work and something else was too expensive.   Taking the ropinirole, but only getting partial relief of cramps.  Pt. Said the sildenafil was too expensive, didn't get it. Then said it didn't help, "like drinking water."     Belsomra didn't help with sleep. Sleeps off and on max total about 4-5 hours or less.  History Joshua Compton has a past medical history of Arthritis, Cancer ([place]), GERD (gastroesophageal reflux disease), Glaucoma, Hyperlipidemia, and Hypertension.   He has a past surgical history that includes Back surgery; Neck surgery; Transurethral resection of prostate; Hernia repair (11-06-2009); Rotator cuff repair (Left, jan 2016); Colonoscopy; Total hip arthroplasty (Right, 04/20/2020); and Total hip arthroplasty (Left, 01/07/2021).   His family history includes Cancer in his brother, brother, and mother; Heart disease in his father.He reports that  he quit smoking about 16 years ago. His smoking use included cigarettes. He has a 15.00 pack-year smoking history. He has never used smokeless tobacco. He reports that he does not drink alcohol and does not use drugs.  Current Outpatient Medications on File Prior to Visit  Medication Sig Dispense Refill   aspirin EC 81 MG tablet Take 1 tablet (81 mg total) by mouth 2 (two) times daily. To be taken after surgery 84 tablet 0   dorzolamide-timolol (COSOPT) 22.3-6.8 MG/ML ophthalmic solution Place 1 drop into both eyes daily.      Enalapril-hydroCHLOROthiazide 5-12.5 MG tablet Take 1 tablet by mouth daily. Take 1 tablet by mouth daily. 90 tablet 3   felodipine (PLENDIL) 5 MG 24 hr tablet TAKE 1 TABLET BY MOUTH EVERY DAY FOR BLOOD PRESSURE 90 tablet 3   fluticasone (FLONASE) 50 MCG/ACT nasal spray Place 1 spray into both nostrils 2 (two) times daily as needed for allergies or rhinitis. 16 g 6   ondansetron (ZOFRAN) 4 MG tablet Take 1 tablet (4 mg total) by mouth every 8 (eight) hours as needed for nausea or vomiting. 40 tablet 0   pantoprazole (PROTONIX) 40 MG tablet Take 1 tablet (40 mg total) by mouth daily. For heartburn 90 tablet 3   rOPINIRole (REQUIP) 1 MG tablet Take 1 tablet (1 mg total) by mouth 2 (two) times daily. 60 tablet 5   rosuvastatin (CRESTOR) 5 MG tablet Take 1 tablet (5 mg total) by mouth daily. 90 tablet 3   No current facility-administered medications on file prior to visit.    ROS Review of Systems  Constitutional: Negative.   HENT: Negative.    Eyes:  Negative for visual disturbance.  Respiratory:  Negative for cough and shortness of breath.   Cardiovascular:  Negative for chest pain and leg swelling.  Gastrointestinal:  Negative for abdominal pain, diarrhea, nausea and vomiting.  Genitourinary:  Negative for difficulty urinating.  Musculoskeletal:  Positive for myalgias. Negative for arthralgias.  Skin:  Negative for rash.  Neurological:  Negative for headaches.   Psychiatric/Behavioral:  Positive for sleep disturbance.    Objective:  BP (!) 137/56   Pulse 67   Temp (!) 97.3 F (36.3 C)   Ht 6' (1.829 m)   Wt 201 lb 9.6 oz (91.4 kg)   SpO2 95%   BMI 27.34 kg/m   BP Readings from Last 3 Encounters:  09/10/21 (!) 137/56  06/11/21 140/65  02/27/21 (!) 141/65    Wt Readings from Last 3 Encounters:  09/10/21 201 lb 9.6 oz (91.4 kg)  06/11/21 200 lb 12.8 oz (91.1 kg)  02/27/21 203 lb (92.1 kg)     Physical Exam    Assessment & Plan:   [name] was seen today for medical management of chronic issues.  Diagnoses and all orders for this visit:  Essential hypertension -     CMP14+EGFR  Mixed hyperlipidemia -     CMP14+EGFR  Gastroesophageal reflux disease with esophagitis without hemorrhage  Leg cramps  Primary insomnia  Vasculogenic erectile dysfunction, unspecified vasculogenic erectile dysfunction type  Other orders -     sildenafil (REVATIO) 20 MG tablet; Take 2-5 pills at once, orally, with each sexual encounter  Allergies as of 09/10/2021       Reactions   Pravastatin Sodium    Muscle aches        Medication List        Accurate as of September 10, 2021  8:12 PM. If you have any questions, ask your nurse or doctor.          STOP taking these medications    Belsomra 10 MG Tabs Generic drug: Suvorexant Stopped by: [name], MD       TAKE these medications    aspirin EC 81 MG tablet Take 1 tablet (81 mg total) by mouth 2 (two) times daily. To be taken after surgery   dorzolamide-timolol 22.3-6.8 MG/ML ophthalmic solution Commonly known as: COSOPT Place 1 drop into both eyes daily.   Enalapril-hydroCHLOROthiazide 5-12.5 MG tablet Take 1 tablet by mouth daily. Take 1 tablet by mouth daily.   felodipine 5 MG 24 hr tablet Commonly known as: PLENDIL TAKE 1 TABLET BY MOUTH EVERY DAY FOR BLOOD PRESSURE   fluticasone 50 MCG/ACT nasal spray Commonly known as: FLONASE Place 1 spray into  both nostrils 2 (two) times daily as needed for allergies or rhinitis.   ondansetron 4 MG tablet Commonly known as: Zofran Take 1 tablet (4 mg total) by mouth every 8 (eight) hours as needed for nausea or vomiting.   pantoprazole 40 MG tablet Commonly known as: PROTONIX Take 1 tablet (40 mg total) by mouth daily. For heartburn   rOPINIRole 1 MG tablet Commonly known as: REQUIP Take 1 tablet (1 mg total) by mouth 2 (two) times daily.   rosuvastatin 5 MG tablet Commonly known as: CRESTOR Take 1 tablet (5 mg total) by mouth daily.   sildenafil 20 MG tablet Commonly known as: REVATIO Take 2-5 pills at once, orally, with each sexual encounter Started by: [name], MD        Meds ordered this encounter  Medications   sildenafil (REVATIO) 20 MG tablet    Sig:  Take 2-5 pills at once, orally, with each sexual encounter    Dispense:  12 tablet    Refill:  5      Follow-up: Return in about 6 months (around 03/11/2022).  [name], M.D.

## 2021-09-11 LAB — CMP14+EGFR
ALT: 20 IU/L (ref 0–44)
AST: 22 IU/L (ref 0–40)
Albumin/Globulin Ratio: 1.7 (ref 1.2–2.2)
Albumin: 4.6 g/dL (ref 3.7–4.7)
Alkaline Phosphatase: 91 IU/L (ref 44–121)
BUN/Creatinine Ratio: 13 (ref 10–24)
BUN: 15 mg/dL (ref 8–27)
Bilirubin Total: 0.7 mg/dL (ref 0.0–1.2)
CO2: 24 mmol/L (ref 20–29)
Calcium: 9.9 mg/dL (ref 8.6–10.2)
Chloride: 102 mmol/L (ref 96–106)
Creatinine, Ser: 1.19 mg/dL (ref 0.76–1.27)
Globulin, Total: 2.7 g/dL (ref 1.5–4.5)
Glucose: 111 mg/dL — ABNORMAL HIGH (ref 70–99)
Potassium: 3.7 mmol/L (ref 3.5–5.2)
Sodium: 141 mmol/L (ref 134–144)
Total Protein: 7.3 g/dL (ref 6.0–8.5)
eGFR: 62 mL/min/{1.73_m2} (ref >59)

## 2021-09-11 NOTE — Progress Notes (Signed)
Hello Court,  Your lab result is normal and/or stable.Some minor variations that are not significant are commonly marked abnormal, but do not represent any medical problem for you.  Best regards, Kaitlyn Skowron, M.D.

## 2021-09-13 ENCOUNTER — Ambulatory Visit (INDEPENDENT_AMBULATORY_CARE_PROVIDER_SITE_OTHER): Payer: Medicare HMO | Admitting: Licensed Clinical Social Worker

## 2021-09-13 DIAGNOSIS — D508 Other iron deficiency anemias: Secondary | ICD-10-CM

## 2021-09-13 DIAGNOSIS — E782 Mixed hyperlipidemia: Secondary | ICD-10-CM

## 2021-09-13 DIAGNOSIS — I10 Essential (primary) hypertension: Secondary | ICD-10-CM

## 2021-09-13 DIAGNOSIS — C61 Malignant neoplasm of prostate: Secondary | ICD-10-CM

## 2021-09-13 NOTE — Chronic Care Management (AMB) (Signed)
Chronic Care Management    Clinical Social Work Note  09/13/2021 Name: Joshua Compton. MRN: [id number] DOB: 1941-04-09  Joshua Compton. is a 80 y.o. year old male who is a primary care patient of Stacks, Cletus Gash, MD. The CCM team was consulted to assist the patient with chronic disease management and/or care coordination needs related to: [employer] .   Engaged with patient/ spouse of patient, Joshua Compton by telephone for follow up visit in response to provider referral for social work chronic care management and care coordination services.   Consent to Services:  The patient was given information about Chronic Care Management services, agreed to services, and gave verbal consent prior to initiation of services.  Please see initial visit note for detailed documentation.   Patient agreed to services and consent obtained.   Assessment: Review of patient past medical history, allergies, medications, and health status, including review of relevant consultants reports was performed today as part of a comprehensive evaluation and provision of chronic care management and care coordination services.     SDOH (Social Determinants of Health) assessments and interventions performed:  SDOH Interventions    Flowsheet Row Most Recent Value  SDOH Interventions   Physical Activity Interventions Other (Comments)  [client sometimes is fatigued when he walks. He does not sleep very well at night and may be fatigued during day and may have low energy when he walks]  Stress Interventions Provide Counseling  [client has stress over insomnia.  He is often restless at night and may only get 4-5 hours of sleep at night.  His wife said he is fatigued sometimes during the day]  Depression Interventions/Treatment  --  [informed Joshua Compton, spouse of client, of LCSW support for client and of RNCM support for client]        Advanced Directives Status: See [employer] application for related  entries.  CCM Care Plan  Allergies  Allergen Reactions   Pravastatin Sodium     Muscle aches    Outpatient Encounter Medications as of 09/13/2021  Medication Sig   aspirin EC 81 MG tablet Take 1 tablet (81 mg total) by mouth 2 (two) times daily. To be taken after surgery   dorzolamide-timolol (COSOPT) 22.3-6.8 MG/ML ophthalmic solution Place 1 drop into both eyes daily.    Enalapril-hydroCHLOROthiazide 5-12.5 MG tablet Take 1 tablet by mouth daily. Take 1 tablet by mouth daily.   felodipine (PLENDIL) 5 MG 24 hr tablet TAKE 1 TABLET BY MOUTH EVERY DAY FOR BLOOD PRESSURE   fluticasone (FLONASE) 50 MCG/ACT nasal spray Place 1 spray into both nostrils 2 (two) times daily as needed for allergies or rhinitis.   ondansetron (ZOFRAN) 4 MG tablet Take 1 tablet (4 mg total) by mouth every 8 (eight) hours as needed for nausea or vomiting.   pantoprazole (PROTONIX) 40 MG tablet Take 1 tablet (40 mg total) by mouth daily. For heartburn   rOPINIRole (REQUIP) 1 MG tablet Take 1 tablet (1 mg total) by mouth 2 (two) times daily.   rosuvastatin (CRESTOR) 5 MG tablet Take 1 tablet (5 mg total) by mouth daily.   sildenafil (REVATIO) 20 MG tablet Take 2-5 pills at once, orally, with each sexual encounter   No facility-administered encounter medications on file as of 09/13/2021.    Patient Active Problem List   Diagnosis Date Noted   Gastroesophageal reflux disease with esophagitis without hemorrhage 09/10/2021   Primary insomnia 09/10/2021   Vasculogenic erectile dysfunction 09/10/2021   Status post total replacement of  left hip 01/07/2021   Primary osteoarthritis of left hip 01/06/2021   Leg cramps 12/11/2020   Thyromegaly 05/19/2017   Neoplasm of scalp 12/08/2013   Rhinitis 12/08/2013   Cough 12/08/2013   Hyperlipidemia    Cancer ([place])    Arthritis    Severe stage glaucoma 03/26/2012   Nuclear sclerosis 03/26/2012   Primary open angle glaucoma 10/27/2011   Essential hypertension 02/04/2011    Degenerative disc disease 02/04/2011   Gastrointestinal bleed 02/04/2011   Anemia 02/04/2011   Prostate cancer ([place]) 02/04/2011    Conditions to be addressed/monitored: monitor client completion of daily ADLs  Care Plan : LCSW care plan  Updates made by Joshua Cabal, LCSW since 09/13/2021 12:00 AM     Problem: Functional Decline      Goal: complete ADLs daily as able   Start Date: 09/13/2021  Expected End Date: 12/12/2021  This Visit's Progress: On track  Recent Progress: On track  Priority: Medium  Note:   Current barriers:   Patient in need of assistance with connecting to community resources for possible help in completing ADLs for client and other daily activities of client Patient is unable to independently navigate community resource options without care coordination support Pain issues Mobility issues Insomnia Decreased energy occasionally  Clinical Goals:  patient will work with SW monthly to address concerns related to ADLs completion of client  Patient will call RNCM or LCSW as needed in next 30 days for CCM program support  Clinical Interventions:  Collaboration with Joshua Fraise, MD regarding development and update of comprehensive plan of care as evidenced by provider attestation and co-signature Assessment of needs, barriers of client Discussed with Joshua Compton, spouse of client, pain issues of client Reviewed with Joshua Compton ambulation of client. She said client is walking well. He likes playing golf, going fishing and enjoys working in the yard. Discussed with Joshua Compton client's upcoming medical appointments Reviewed with Joshua Compton medication procurement of client and reviewed with Joshua Compton client relaxation techniques Encouraged client  or Joshua Compton to call RNCM as needed for nursing support for client Discussed with Joshua Compton the socialization of client. She said client likes to go on fishing activities with his brothers.She said he likes to go  golfing with friends. He enjoys socializing with family memers Reviewed with Joshua Compton client challenges with insomnia.  She said client sleeps about 4-5 hours per night and thus client is fatigued during the day. She said sometimes client has low energy during the day  Patient Coping Skills/Strengths: Has support from his wife, Joshua Compton Attends scheduled medical appointments Takes medications as prescribed  Patient Deficits: Pain issues Mobility issues  Patient Goals:  Patient will talk with LCSW in next 30 days to discuss client challenges in doing ADLs daily Patient will attend scheduled medical appointments in next 30 days Patient will allow time to rest and relax in next 30 days -  Follow Up Plan: LCSW to call client or spouse of client on 11/06/21 at 2:00 PM to assess client needs.        [name].Brighten Buzzelli MSW, LCSW Licensed Clinical Social Worker [facility] Care Management [phone]

## 2021-09-13 NOTE — Patient Instructions (Signed)
Visit Information  PATIENT GOALS:  Goals Addressed             This Visit's Progress    Complete ADLs daily as able. Protect My Health       Timeframe:  Short-Term Goal Priority:  Medium Progress: On Track Start Date:          09/13/21                Expected End Date:        12/12/21            Follow Up Date  11/06/21 at 2:00 PM   Protect My Health (Patient)  Complete ADLs daily as able   Why is this important?   Screening tests can find diseases early when they are easier to treat.  Your doctor or nurse will talk with you about which tests are important for you.  Getting shots for common diseases like the flu and shingles will help prevent them.     Patient Coping Skills/Strengths: Has support from his wife, [name] Attends scheduled medical appointments Takes medications as prescribed  Patient Deficits: Pain issues Mobility issues  Patient Goals:  Patient will talk with LCSW in next 30 days to discuss client challenges in doing ADLs daily Patient will attend scheduled medical appointments in next 30 days Patient will allow time to rest and relax in next 30 days -  Follow Up Plan: LCSW to call client or spouse of client on 11/06/21 at 2:00 PM to assess client needs      [name].Honesty Menta MSW, LCSW Licensed Clinical Social Worker [facility] Care Management [phone]

## 2021-09-16 DIAGNOSIS — I10 Essential (primary) hypertension: Secondary | ICD-10-CM | POA: Diagnosis not present

## 2021-09-16 DIAGNOSIS — E782 Mixed hyperlipidemia: Secondary | ICD-10-CM | POA: Diagnosis not present

## 2021-09-16 DIAGNOSIS — C61 Malignant neoplasm of prostate: Secondary | ICD-10-CM | POA: Diagnosis not present

## 2021-09-16 DIAGNOSIS — D508 Other iron deficiency anemias: Secondary | ICD-10-CM

## 2021-09-20 IMAGING — RF DG C-ARM 1-60 MIN
1 series · 4 of 4 positions shown · non-contrast
Comparison: 03/02/2020

CLINICAL DATA: Right hip arthroplasty

EXAM:
OPERATIVE RIGHT HIP (WITH PELVIS IF PERFORMED) 1 VIEWS
TECHNIQUE: Fluoroscopic spot image(s) were submitted for interpretation
post-operatively.

[Series 1: unknown protocol · 0.20mm/px · 4 of 4 slices shown]
[im 1/4]
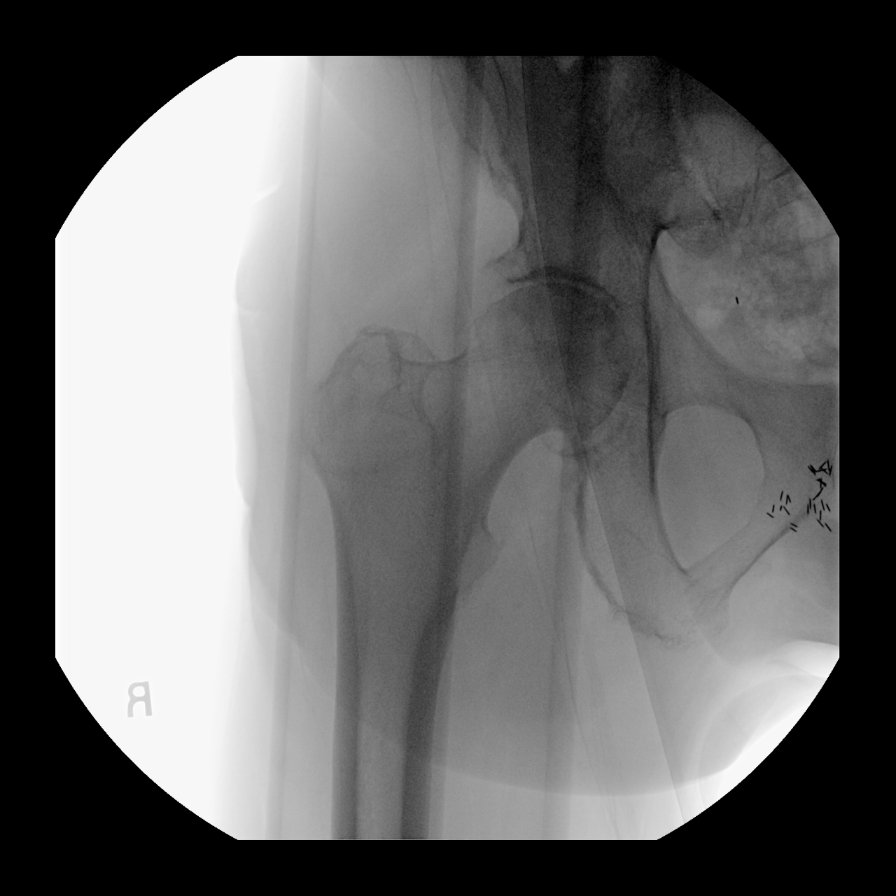
[im 2/4]
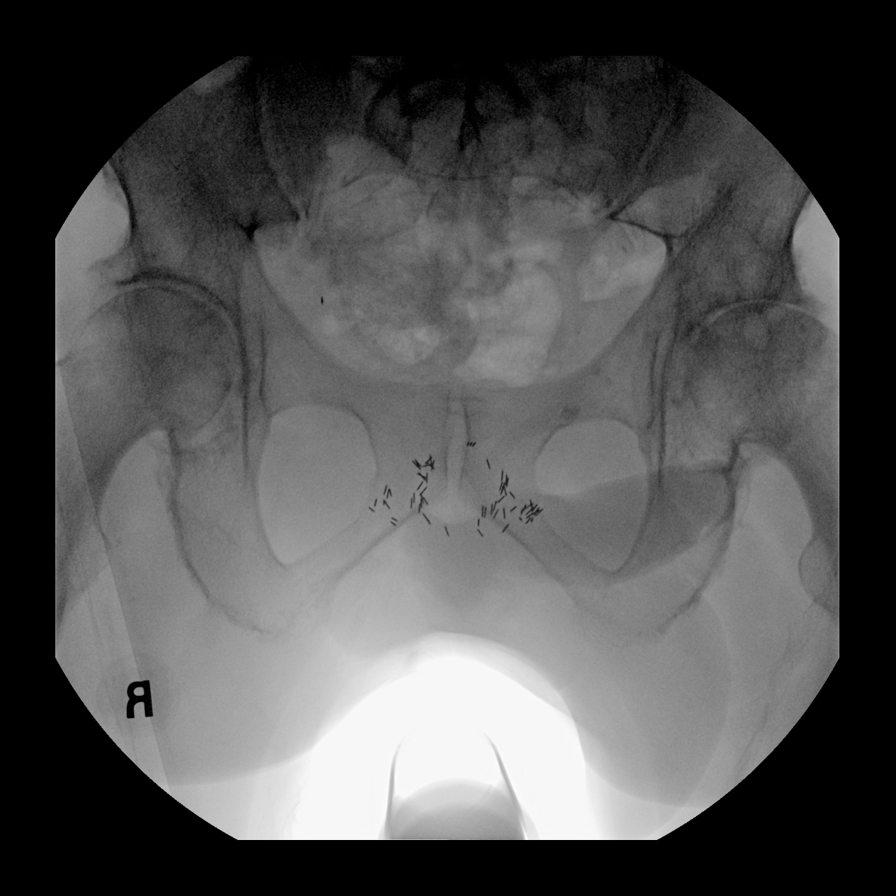
[im 3/4]
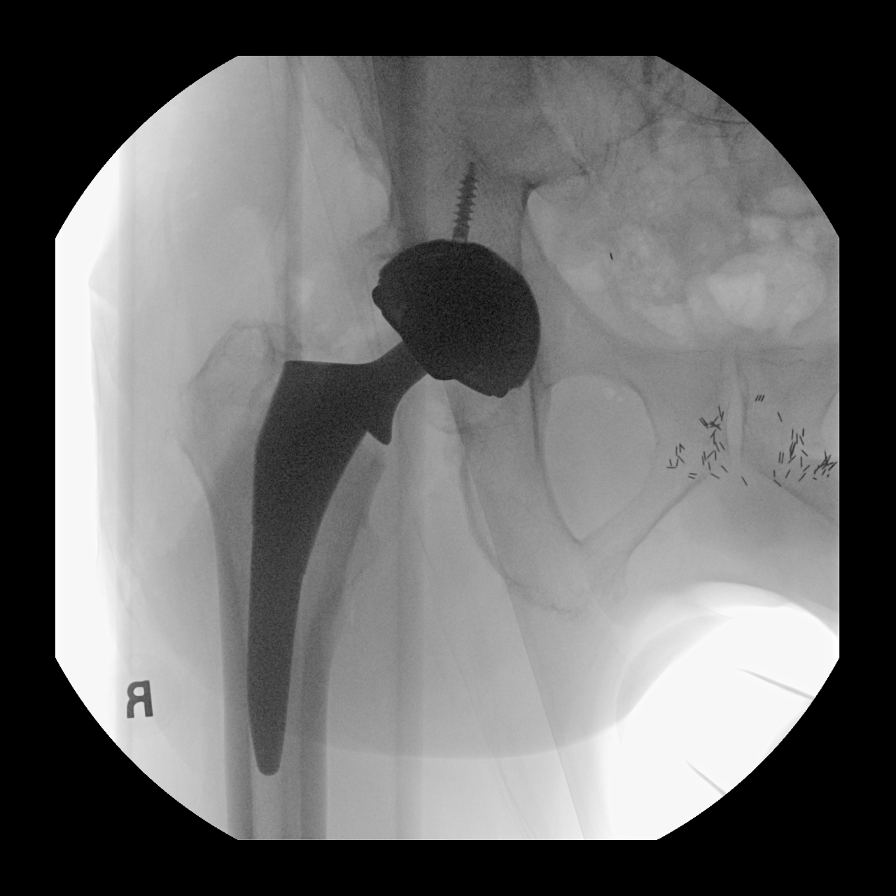
[im 4/4]
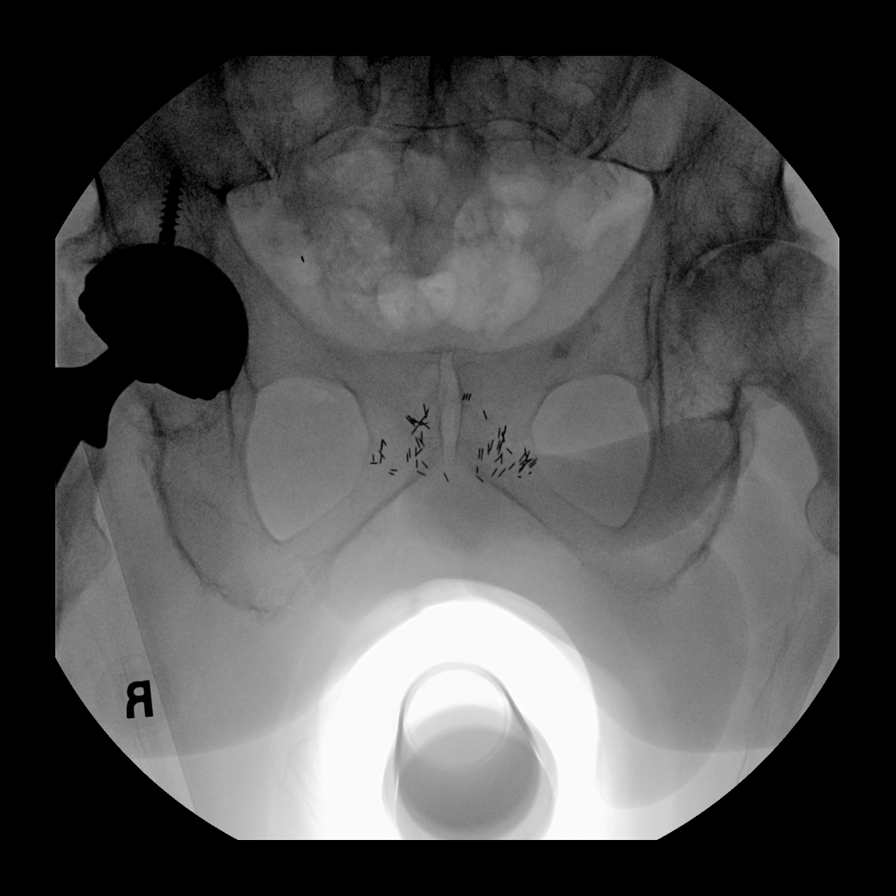

[4 of 4 positions shown; findings below may reference images not displayed]

FINDINGS: 4 fluoroscopic images are obtained during the performance of the
procedure and are provided for interpretation only. Initial images
demonstrate significant right hip osteoarthritis. Subsequent images
demonstrate placement of a right hip arthroplasty in the expected
position without complication. Severe left hip osteoarthritis
incidentally noted.

FLUOROSCOPY TIME:  24 seconds
IMPRESSION: 1. Right hip arthroplasty as above. Please refer to operative
report.

## 2021-11-06 ENCOUNTER — Telehealth: Payer: Medicare HMO

## 2021-12-04 ENCOUNTER — Other Ambulatory Visit: Payer: Self-pay | Admitting: Family Medicine

## 2021-12-11 ENCOUNTER — Encounter: Payer: Self-pay | Admitting: Family Medicine

## 2021-12-11 ENCOUNTER — Ambulatory Visit (INDEPENDENT_AMBULATORY_CARE_PROVIDER_SITE_OTHER): Payer: Medicare HMO | Admitting: Family Medicine

## 2021-12-11 VITALS — BP 134/59 | HR 73 | Temp 97.8°F | Ht 72.0 in | Wt 204.6 lb

## 2021-12-11 DIAGNOSIS — I10 Essential (primary) hypertension: Secondary | ICD-10-CM | POA: Diagnosis not present

## 2021-12-11 DIAGNOSIS — M1990 Unspecified osteoarthritis, unspecified site: Secondary | ICD-10-CM

## 2021-12-11 DIAGNOSIS — E782 Mixed hyperlipidemia: Secondary | ICD-10-CM | POA: Diagnosis not present

## 2021-12-11 DIAGNOSIS — D508 Other iron deficiency anemias: Secondary | ICD-10-CM

## 2021-12-11 MED ORDER — ROPINIROLE HCL 1 MG PO TABS: 1.0000 mg | ORAL_TABLET | Freq: Two times a day (BID) | ORAL | 3 refills | Status: DC

## 2021-12-11 NOTE — Progress Notes (Signed)
Subjective:  Patient ID: Joshua Guiles., male    DOB: 1941-06-30  Age: 81 y.o. MRN: [id number]  CC: Medical Management of Chronic Issues   HPI Joshua Compton. presents for  follow-up of hypertension. Patient has no history of headache chest pain or shortness of breath or recent cough. Patient also denies symptoms of TIA such as focal numbness or weakness. Patient denies side effects from medication. States taking it regularly.  Patient in for follow-up of GERD. Currently asymptomatic taking  PPI daily. There is no chest pain or heartburn. No hematemesis and no melena. No dysphagia or choking. Onset is remote. Progression is stable. Complicating factors, none.   Still having leg cramps. When he makes a certain move. When he rolls over in bed. Just taking the ropinirole once a day.    History Joshua Compton has a past medical history of Arthritis, Cancer ([place]), GERD (gastroesophageal reflux disease), Glaucoma, Hyperlipidemia, and Hypertension.   He has a past surgical history that includes Back surgery; Neck surgery; Transurethral resection of prostate; Hernia repair (11-06-2009); Rotator cuff repair (Left, jan 2016); Colonoscopy; Total hip arthroplasty (Right, 04/20/2020); and Total hip arthroplasty (Left, 01/07/2021).   His family history includes Cancer in his brother, brother, and mother; Heart disease in his father.He reports that he quit smoking about 16 years ago. His smoking use included cigarettes. He has a 15.00 pack-year smoking history. He has never used smokeless tobacco. He reports that he does not drink alcohol and does not use drugs.  Current Outpatient Medications on File Prior to Visit  Medication Sig Dispense Refill   aspirin EC 81 MG tablet Take 1 tablet (81 mg total) by mouth 2 (two) times daily. To be taken after surgery 84 tablet 0   dorzolamide-timolol (COSOPT) 22.3-6.8 MG/ML ophthalmic solution Place 1 drop into both eyes daily.       Enalapril-hydroCHLOROthiazide 5-12.5 MG tablet Take 1 tablet by mouth daily. Take 1 tablet by mouth daily. 90 tablet 3   felodipine (PLENDIL) 5 MG 24 hr tablet TAKE 1 TABLET BY MOUTH EVERY DAY FOR BLOOD PRESSURE 90 tablet 3   fluticasone (FLONASE) 50 MCG/ACT nasal spray Place 1 spray into both nostrils 2 (two) times daily as needed for allergies or rhinitis. 16 g 6   pantoprazole (PROTONIX) 40 MG tablet Take 1 tablet (40 mg total) by mouth daily. For heartburn 90 tablet 3   rosuvastatin (CRESTOR) 5 MG tablet Take 1 tablet (5 mg total) by mouth daily. 90 tablet 3   sildenafil (REVATIO) 20 MG tablet Take 2-5 pills at once, orally, with each sexual encounter 12 tablet 5   No current facility-administered medications on file prior to visit.    ROS Review of Systems  Constitutional:  Negative for fever.  Respiratory:  Negative for shortness of breath.   Cardiovascular:  Negative for chest pain.  Musculoskeletal:  Negative for arthralgias.  Skin:  Negative for rash.   Objective:  BP (!) 134/59    Pulse 73    Temp 97.8 F (36.6 C)    Ht 6' (1.829 m)    Wt 204 lb 9.6 oz (92.8 kg)    SpO2 97%    BMI 27.75 kg/m   BP Readings from Last 3 Encounters:  12/11/21 (!) 134/59  09/10/21 (!) 137/56  06/11/21 140/65    Wt Readings from Last 3 Encounters:  12/11/21 204 lb 9.6 oz (92.8 kg)  09/10/21 201 lb 9.6 oz (91.4 kg)  06/11/21 200 lb 12.8 oz (91.1 kg)  Physical Exam Vitals reviewed.  Constitutional:      Appearance: He is well-developed.  HENT:     Head: Normocephalic and atraumatic.     Right Ear: External ear normal.     Left Ear: External ear normal.     Mouth/Throat:     Pharynx: No oropharyngeal exudate or posterior oropharyngeal erythema.  Eyes:     Pupils: Pupils are equal, round, and reactive to light.  Cardiovascular:     Rate and Rhythm: Normal rate and regular rhythm.     Heart sounds: No murmur heard. Pulmonary:     Effort: No respiratory distress.     Breath  sounds: Normal breath sounds.  Musculoskeletal:     Cervical back: Normal range of motion and neck supple.  Neurological:     Mental Status: He is alert and oriented to person, place, and time.      Assessment & Plan:   Joshua Compton was seen today for medical management of chronic issues.  Diagnoses and all orders for this visit:  Other iron deficiency anemia -     CBC with Differential/Platelet  Arthritis -     CMP14+EGFR  Essential hypertension -     CMP14+EGFR  Mixed hyperlipidemia -     CMP14+EGFR -     Lipid panel  Other orders -     rOPINIRole (REQUIP) 1 MG tablet; Take 1 tablet (1 mg total) by mouth 2 (two) times daily.   Allergies as of 12/11/2021       Reactions   Pravastatin Sodium    Muscle aches        Medication List        Accurate as of December 11, 2021  9:25 AM. If you have any questions, ask your nurse or doctor.          STOP taking these medications    ondansetron 4 MG tablet Commonly known as: Zofran Stopped by: [name], MD       TAKE these medications    aspirin EC 81 MG tablet Take 1 tablet (81 mg total) by mouth 2 (two) times daily. To be taken after surgery   dorzolamide-timolol 22.3-6.8 MG/ML ophthalmic solution Commonly known as: COSOPT Place 1 drop into both eyes daily.   Enalapril-hydroCHLOROthiazide 5-12.5 MG tablet Take 1 tablet by mouth daily. Take 1 tablet by mouth daily.   felodipine 5 MG 24 hr tablet Commonly known as: PLENDIL TAKE 1 TABLET BY MOUTH EVERY DAY FOR BLOOD PRESSURE   fluticasone 50 MCG/ACT nasal spray Commonly known as: FLONASE Place 1 spray into both nostrils 2 (two) times daily as needed for allergies or rhinitis.   pantoprazole 40 MG tablet Commonly known as: PROTONIX Take 1 tablet (40 mg total) by mouth daily. For heartburn   rOPINIRole 1 MG tablet Commonly known as: REQUIP Take 1 tablet (1 mg total) by mouth 2 (two) times daily.   rosuvastatin 5 MG tablet Commonly known as:  CRESTOR Take 1 tablet (5 mg total) by mouth daily.   sildenafil 20 MG tablet Commonly known as: REVATIO Take 2-5 pills at once, orally, with each sexual encounter        Meds ordered this encounter  Medications   rOPINIRole (REQUIP) 1 MG tablet    Sig: Take 1 tablet (1 mg total) by mouth 2 (two) times daily.    Dispense:  180 tablet    Refill:  3    Emphasized that he needs to take the second daily dose of  ropinirole to relieve his cramps.  Follow-up: Return in about 6 months (around 06/10/2022).  [name], M.D.

## 2021-12-12 LAB — LIPID PANEL
Chol/HDL Ratio: 2.6 ratio (ref 0.0–5.0)
Cholesterol, Total: 147 mg/dL (ref 100–199)
HDL: 57 mg/dL (ref >39)
LDL Chol Calc (NIH): 74 mg/dL (ref 0–99)
Triglycerides: 84 mg/dL (ref 0–149)
VLDL Cholesterol Cal: 16 mg/dL (ref 5–40)

## 2021-12-12 LAB — CBC WITH DIFFERENTIAL/PLATELET
Basophils Absolute: 0.1 10*3/uL (ref 0.0–0.2)
Basos: 1 %
EOS (ABSOLUTE): 0.2 10*3/uL (ref 0.0–0.4)
Eos: 3 %
Hematocrit: 42.2 % (ref 37.5–51.0)
Hemoglobin: 14.4 g/dL (ref 13.0–17.7)
Immature Grans (Abs): 0 10*3/uL (ref 0.0–0.1)
Immature Granulocytes: 1 %
Lymphocytes Absolute: 2 10*3/uL (ref 0.7–3.1)
Lymphs: 31 %
MCH: 31.4 pg (ref 26.6–33.0)
MCHC: 34.1 g/dL (ref 31.5–35.7)
MCV: 92 fL (ref 79–97)
Monocytes Absolute: 0.5 10*3/uL (ref 0.1–0.9)
Monocytes: 7 %
Neutrophils Absolute: 3.7 10*3/uL (ref 1.4–7.0)
Neutrophils: 57 %
Platelets: 212 10*3/uL (ref 150–450)
RBC: 4.59 x10E6/uL (ref 4.14–5.80)
RDW: 12.9 % (ref 11.6–15.4)
WBC: 6.4 10*3/uL (ref 3.4–10.8)

## 2021-12-12 LAB — CMP14+EGFR
ALT: 25 IU/L (ref 0–44)
AST: 25 IU/L (ref 0–40)
Albumin/Globulin Ratio: 1.7 (ref 1.2–2.2)
Albumin: 4.5 g/dL (ref 3.7–4.7)
Alkaline Phosphatase: 86 IU/L (ref 44–121)
BUN/Creatinine Ratio: 15 (ref 10–24)
BUN: 21 mg/dL (ref 8–27)
Bilirubin Total: 0.5 mg/dL (ref 0.0–1.2)
CO2: 23 mmol/L (ref 20–29)
Calcium: 9.9 mg/dL (ref 8.6–10.2)
Chloride: 102 mmol/L (ref 96–106)
Creatinine, Ser: 1.36 mg/dL — ABNORMAL HIGH (ref 0.76–1.27)
Globulin, Total: 2.7 g/dL (ref 1.5–4.5)
Glucose: 107 mg/dL — ABNORMAL HIGH (ref 70–99)
Potassium: 4 mmol/L (ref 3.5–5.2)
Sodium: 143 mmol/L (ref 134–144)
Total Protein: 7.2 g/dL (ref 6.0–8.5)
eGFR: 53 mL/min/{1.73_m2} — ABNORMAL LOW (ref >59)

## 2021-12-12 NOTE — Progress Notes (Signed)
Hello [place],  Your lab result is normal and/or stable.Some minor variations that are not significant are commonly marked abnormal, but do not represent any medical problem for you.  Best regards, Mihcael Ledee, M.D.

## 2021-12-25 ENCOUNTER — Ambulatory Visit (INDEPENDENT_AMBULATORY_CARE_PROVIDER_SITE_OTHER): Payer: Medicare HMO

## 2021-12-25 VITALS — Wt 204.0 lb

## 2021-12-25 DIAGNOSIS — Z0000 Encounter for general adult medical examination without abnormal findings: Secondary | ICD-10-CM

## 2021-12-25 NOTE — Progress Notes (Signed)
Subjective:   Joshua Compton. is a 81 y.o. male who presents for Medicare Annual/Subsequent preventive examination.  Virtual Visit via Telephone Note  I connected with  Kegan Mckeithan. on 12/25/21 at  3:30 PM EST by telephone and verified that I am speaking with the correct person using two identifiers.  Location: Patient: Home Provider: [facility] Persons participating in the virtual visit: patient/Nurse Health Advisor   I discussed the limitations, risks, security and privacy concerns of performing an evaluation and management service by telephone and the availability of in person appointments. The patient expressed understanding and agreed to proceed.  Interactive audio and video telecommunications were attempted between this nurse and patient, however failed, due to patient having technical difficulties OR patient did not have access to video capability.  We continued and completed visit with audio only.  Some vital signs may be absent or patient reported.   Almando Brawley E Quanna Wittke, LPN   Review of Systems     Cardiac Risk Factors include: advanced age (>63men, >39 women);sedentary lifestyle;male gender;hypertension;Other (see comment);dyslipidemia, Risk factor comments: atherosclerosis of aorta     Objective:    Today's Vitals   12/25/21 1534  Weight: 204 lb (92.5 kg)   Body mass index is 27.67 kg/m.  Advanced Directives 12/25/2021 01/07/2021 01/03/2021 12/24/2020 04/17/2020 02/21/2020 01/29/2020  Does Patient Have a Medical Advance Directive? Yes No No No No No Yes  Type of Paramedic of [place];Living will - - - - - -  Does patient want to make changes to medical advance directive? - - - - - - No - Patient declined  Copy of [place] in Chart? No - copy requested - - - - - -  Would patient like information on creating a medical advance directive? - No - Patient declined No - Patient declined No - Patient declined Yes  (MAU/Ambulatory/Procedural Areas - Information given) No - Patient declined -  Pre-existing out of facility DNR order (yellow form or pink MOST form) - - - - - - -    Current Medications (verified) Outpatient Encounter Medications as of 12/25/2021  Medication Sig   aspirin EC 81 MG tablet Take 1 tablet (81 mg total) by mouth 2 (two) times daily. To be taken after surgery   dorzolamide-timolol (COSOPT) 22.3-6.8 MG/ML ophthalmic solution Place 1 drop into both eyes daily.    Enalapril-hydroCHLOROthiazide 5-12.5 MG tablet Take 1 tablet by mouth daily. Take 1 tablet by mouth daily.   felodipine (PLENDIL) 5 MG 24 hr tablet TAKE 1 TABLET BY MOUTH EVERY DAY FOR BLOOD PRESSURE   fluticasone (FLONASE) 50 MCG/ACT nasal spray Place 1 spray into both nostrils 2 (two) times daily as needed for allergies or rhinitis.   pantoprazole (PROTONIX) 40 MG tablet Take 1 tablet (40 mg total) by mouth daily. For heartburn   rOPINIRole (REQUIP) 1 MG tablet Take 1 tablet (1 mg total) by mouth 2 (two) times daily.   rosuvastatin (CRESTOR) 5 MG tablet Take 1 tablet (5 mg total) by mouth daily.   sildenafil (REVATIO) 20 MG tablet Take 2-5 pills at once, orally, with each sexual encounter   No facility-administered encounter medications on file as of 12/25/2021.    Allergies (verified) Pravastatin sodium   History: Past Medical History:  Diagnosis Date   Arthritis    Cancer ([place])    prostate ca   seed implants   GERD (gastroesophageal reflux disease)    Glaucoma    Hyperlipidemia  Hypertension    dr Jacelyn Grip     [facility] Niece   fm   Past Surgical History:  Procedure Laterality Date   BACK SURGERY     x2   COLONOSCOPY     HERNIA REPAIR  11-06-2009   left inguinal repair    NECK SURGERY     ROTATOR CUFF REPAIR Left jan 2016   TOTAL HIP ARTHROPLASTY Right 04/20/2020   Procedure: RIGHT TOTAL HIP ARTHROPLASTY ANTERIOR APPROACH;  Surgeon: Leandrew Koyanagi, MD;  Location: [place];  Service: Orthopedics;  Laterality: Right;    TOTAL HIP ARTHROPLASTY Left 01/07/2021   Procedure: LEFT TOTAL HIP ARTHROPLASTY ANTERIOR APPROACH;  Surgeon: Leandrew Koyanagi, MD;  Location: [place];  Service: Orthopedics;  Laterality: Left;   TRANSURETHRAL RESECTION OF PROSTATE     Family History  Problem Relation Age of Onset   Cancer Mother        breast   Heart disease Father    Cancer Brother    Cancer Brother    Social History   Socioeconomic History   Marital status: Married    Spouse name: Not on file   Number of children: 5   Years of education: 11   Highest education level: 11th grade  Occupational History   Occupation: Paramedic    Comment: retired  Tobacco Use   Smoking status: Former    Packs/day: 0.50    Years: 30.00    Pack years: 15.00    Types: Cigarettes    Quit date: 03/28/2005    Years since quitting: 16.7   Smokeless tobacco: Never  Vaping Use   Vaping Use: Never used  Substance and Sexual Activity   Alcohol use: No    Alcohol/week: 0.0 standard drinks   Drug use: No   Sexual activity: Yes  Other Topics Concern   Not on file  Social History Narrative   Grandson lives with them - no stairs   Social Determinants of Health   Financial Resource Strain: Low Risk    Difficulty of Paying Living Expenses: Not hard at all  Food Insecurity: No Food Insecurity   Worried About Charity fundraiser in the Last Year: Never true   [place] in the Last Year: Never true  Transportation Needs: No Transportation Needs   Lack of Transportation (Medical): No   Lack of Transportation (Non-Medical): No  Physical Activity: Insufficiently Active   Days of Exercise per Week: 7 days   Minutes of Exercise per Session: 20 min  Stress: No Stress Concern Present   Feeling of Stress : Only a little  Social Connections: Engineer, building services of Communication with Friends and Family: More than three times a week   Frequency of Social Gatherings with Friends and Family: More than three times a  week   Attends Religious Services: More than 4 times per year   Active Member of [employer] or Organizations: Yes   Attends Music therapist: More than 4 times per year   Marital Status: Married    Tobacco Counseling Counseling given: Not Answered   Clinical Intake:  Pre-visit preparation completed: Yes  Pain : No/denies pain     BMI - recorded: 27.67 Nutritional Status: BMI 25 -29 Overweight Nutritional Risks: None Diabetes: No  How often do you need to have someone help you when you read instructions, pamphlets, or other written materials from your doctor or pharmacy?: 1 - Never  Diabetic? no  Interpreter Needed?: No  Information entered by :: Santino Kinsella, LPN   Activities of Daily Living In your present state of health, do you have any difficulty performing the following activities: 12/25/2021 01/07/2021  Hearing? N N  Vision? N N  Difficulty concentrating or making decisions? N N  Walking or climbing stairs? N N  Dressing or bathing? N Y  Doing errands, shopping? N N  Preparing Food and eating ? N -  Using the Toilet? N -  In the past six months, have you accidently leaked urine? N -  Do you have problems with loss of bowel control? N -  Managing your Medications? N -  Managing your Finances? N -  Housekeeping or managing your Housekeeping? N -  Some recent data might be hidden    Patient Care Team: [name], MD as PCP - General (Family Medicine) Juanita Craver, MD as Consulting Physician (Gastroenterology) Jovita Gamma, MD as Consulting Physician (Neurosurgery) Irine Seal, MD as Attending Physician (Urology) Monna Fam, MD as Consulting Physician (Ophthalmology) Irine Seal, MD as Attending Physician (Urology) Katha Cabal, LCSW as [place] [facility] (Licensed Clinical Social Worker)  Indicate any recent [place] you may have received from other than [facility] providers in the past year (date may be  approximate).     Assessment:   This is a routine wellness examination for [place].  Hearing/Vision screen Hearing Screening - Comments:: Denies hearing difficulties   Vision Screening - Comments:: Wears otc glasses prn reading - up to date with routine eye exams with [facility]  Dietary issues and exercise activities discussed: Current Exercise Habits: Home exercise routine, Type of exercise: walking;Other - see comments (yard work, house work, stays busy), Time (Minutes): 20, Frequency (Times/Week): 7, Weekly Exercise (Minutes/Week): 140, Intensity: Mild, Exercise limited by: orthopedic condition(s)   Goals Addressed             This Visit's Progress    Exercise 150 min/wk Moderate Activity   On track    Left hip pain. Has upcoming surgery.     Prevent falls   On track      Depression Screen PHQ 2/9 Scores 12/25/2021 12/11/2021 09/13/2021 09/10/2021 08/01/2021 08/01/2021 06/11/2021  PHQ - 2 Score 0 0 0 0 0 0 0  PHQ- 9 Score - - 5 - 3 3 -    Fall Risk Fall Risk  12/25/2021 12/11/2021 09/10/2021 06/11/2021 12/11/2020  Falls in the past year? 0 0 0 0 0  Number falls in past yr: 0 - - - 0  Injury with Fall? 0 - - - 0  Risk for fall due to : No Fall Risks - - - No Fall Risks  Follow up Falls prevention discussed - - - Falls evaluation completed    FALL RISK PREVENTION PERTAINING TO THE HOME:  Any stairs in or around the home? No  If so, are there any without handrails? No  Home free of loose throw rugs in walkways, pet beds, electrical cords, etc? Yes  Adequate lighting in your home to reduce risk of falls? Yes   ASSISTIVE DEVICES UTILIZED TO PREVENT FALLS:  Life alert? No  Use of a cane, walker or w/c? No  Grab bars in the bathroom? Yes  Shower chair or bench in shower? No  Elevated toilet seat or a handicapped toilet? Yes   TIMED UP AND GO:  Was the test performed? No . Telephonic visit  Cognitive Function: Normal cognitive status assessed by direct observation by this  Nurse Health  Advisor. No abnormalities found.    MMSE - Mini Mental State Exam 06/01/2018 03/29/2015  Orientation to time 5 5  Orientation to Place 4 5  Registration 3 3  Attention/ Calculation 4 5  Recall 2 2  Language- name 2 objects 2 2  Language- repeat 1 1  Language- follow 3 step command 3 3  Language- read & follow direction 1 1  Write a sentence 1 1  Copy design 1 1  Total score 27 29     6CIT Screen 12/24/2020  What Year? 0 points  What month? 0 points  What time? 0 points  Count back from 20 0 points  Months in reverse 0 points  Repeat phrase 0 points  Total Score 0    Immunizations Immunization History  Administered Date(s) Administered   Fluad Quad(high Dose 65+) 08/12/2019, 09/04/2020   Influenza, High Dose Seasonal PF 08/15/2014, 10/13/2016, 09/10/2017, 10/19/2018, 08/21/2021   Influenza,inj,Quad PF,6+ Mos 09/19/2013   Moderna Sars-Covid-2 Vaccination 12/22/2019, 01/23/2020   Pneumococcal Conjugate-13 08/15/2014   Pneumococcal Polysaccharide-23 07/19/2011   Tdap 07/19/2011    TDAP status: Due, Education has been provided regarding the importance of this vaccine. Advised may receive this vaccine at local pharmacy or Health Dept. Aware to provide a copy of the vaccination record if obtained from local pharmacy or Health Dept. Verbalized acceptance and understanding.  Flu Vaccine status: Up to date  Pneumococcal vaccine status: Up to date  Covid-19 vaccine status: Completed vaccines  Qualifies for Shingles Vaccine? Yes   Zostavax completed No   Shingrix Completed?: No.    Education has been provided regarding the importance of this vaccine. Patient has been advised to call insurance company to determine out of pocket expense if they have not yet received this vaccine. Advised may also receive vaccine at local pharmacy or Health Dept. Verbalized acceptance and understanding.  Screening Tests Health Maintenance  Topic Date Due   COVID-19 Vaccine (3 - Moderna  risk series) 12/27/2021 (Originally 02/20/2020)   Zoster Vaccines- Shingrix (1 of 2) 03/11/2022 (Originally 02/04/1960)   TETANUS/TDAP  09/10/2022 (Originally 07/18/2021)   Pneumonia Vaccine 54+ Years old  Completed   INFLUENZA VACCINE  Completed   HPV VACCINES  Aged Out    Health Maintenance  There are no preventive care reminders to display for this patient.  Colorectal cancer screening: No longer required.   Lung Cancer Screening: (Low Dose CT Chest recommended if Age 55-80 years, 30 pack-year currently smoking OR have quit w/in 15years.) does not qualify  Additional Screening:  Hepatitis C Screening: does not qualify  Vision Screening: Recommended annual ophthalmology exams for early detection of glaucoma and other disorders of the eye. Is the patient up to date with their annual eye exam?  Yes  Who is the provider or what is the name of the office in which the patient attends annual eye exams? [facility] If pt is not established with a provider, would they like to be referred to a provider to establish care? No .   Dental Screening: Recommended annual dental exams for proper oral hygiene  Community Resource Referral / Chronic Care Management: CRR required this visit?  No   CCM required this visit?  No      Plan:     I have personally reviewed and noted the following in the patients chart:   Medical and social history Use of alcohol, tobacco or illicit drugs  Current medications and supplements including opioid prescriptions. Patient is not currently taking opioid prescriptions.  Functional ability and status Nutritional status Physical activity Advanced directives List of other physicians Hospitalizations, surgeries, and ER visits in previous 12 months Vitals Screenings to include cognitive, depression, and falls Referrals and appointments  In addition, I have reviewed and discussed with patient certain preventive protocols, quality metrics, and best practice  recommendations. A written personalized care plan for preventive services as well as general preventive health recommendations were provided to patient.   Due to this being a telephonic visit, the after visit summary with patients personalized plan was offered to patient via mail or my-chart. Patient declined at this time.  Sandrea Hammond, LPN   05/26/3967   Nurse Notes: None

## 2021-12-25 NOTE — Patient Instructions (Signed)
Joshua Compton , Thank you for taking time to come for your Medicare Wellness Visit. I appreciate your ongoing commitment to your health goals. Please review the following plan we discussed and let me know if I can assist you in the future.   Screening recommendations/referrals: Colonoscopy: No longer required Recommended yearly ophthalmology/optometry visit for glaucoma screening and checkup Recommended yearly dental visit for hygiene and checkup  Vaccinations: Influenza vaccine: done 08/21/2021 - Repeat annually Pneumococcal vaccine: Done 07/19/2011 & 08/15/2014 Tdap vaccine: Done 07/19/2011 - Repeat in 10 years *past due Shingles vaccine: Due - recommend 2 doses 2-6 months apart - over 90% effective   Covid-19: Done 12/22/2019 & 01/23/2020 - bring card to next appt so we can document other dates  Advanced directives: Please bring a copy of your health care power of attorney and living will to the office to be added to your chart at your convenience.  Conditions/risks identified: Aim for 30 minutes of exercise or brisk walking each day, drink 6-8 glasses of water and eat lots of fruits and vegetables.   Next appointment: Follow up in one year for your annual wellness visit.   Preventive Care 81 Years and Older, Male  Preventive care refers to lifestyle choices and visits with your health care provider that can promote health and wellness. What does preventive care include? A yearly physical exam. This is also called an annual well check. Dental exams once or twice a year. Routine eye exams. Ask your health care provider how often you should have your eyes checked. Personal lifestyle choices, including: Daily care of your teeth and gums. Regular physical activity. Eating a healthy diet. Avoiding tobacco and drug use. Limiting alcohol use. Practicing safe sex. Taking low doses of aspirin every day. Taking vitamin and mineral supplements as recommended by your health care provider. What  happens during an annual well check? The services and screenings done by your health care provider during your annual well check will depend on your age, overall health, lifestyle risk factors, and family history of disease. Counseling  Your health care provider may ask you questions about your: Alcohol use. Tobacco use. Drug use. Emotional well-being. Home and relationship well-being. Sexual activity. Eating habits. History of falls. Memory and ability to understand (cognition). Work and work Statistician. Screening  You may have the following tests or measurements: Height, weight, and BMI. Blood pressure. Lipid and cholesterol levels. These may be checked every 5 years, or more frequently if you are over 33 years old. Skin check. Lung cancer screening. You may have this screening every year starting at age 56 if you have a 30-pack-year history of smoking and currently smoke or have quit within the past 15 years. Fecal occult blood test (FOBT) of the stool. You may have this test every year starting at age 21. Flexible sigmoidoscopy or colonoscopy. You may have a sigmoidoscopy every 5 years or a colonoscopy every 10 years starting at age 44. Prostate cancer screening. Recommendations will vary depending on your family history and other risks. Hepatitis C blood test. Hepatitis B blood test. Sexually transmitted disease (STD) testing. Diabetes screening. This is done by checking your blood sugar (glucose) after you have not eaten for a while (fasting). You may have this done every 1-3 years. Abdominal aortic aneurysm (AAA) screening. You may need this if you are a current or former smoker. Osteoporosis. You may be screened starting at age 41 if you are at high risk. Talk with your health care provider about your  test results, treatment options, and if necessary, the need for more tests. Vaccines  Your health care provider may recommend certain vaccines, such as: Influenza vaccine. This  is recommended every year. Tetanus, diphtheria, and acellular pertussis (Tdap, Td) vaccine. You may need a Td booster every 10 years. Zoster vaccine. You may need this after age 20. Pneumococcal 13-valent conjugate (PCV13) vaccine. One dose is recommended after age 43. Pneumococcal polysaccharide (PPSV23) vaccine. One dose is recommended after age 27. Talk to your health care provider about which screenings and vaccines you need and how often you need them. This information is not intended to replace advice given to you by your health care provider. Make sure you discuss any questions you have with your health care provider. Document Released: 11/30/2015 Document Revised: 07/23/2016 Document Reviewed: 09/04/2015 Elsevier Interactive Patient Education  2017 [place] Prevention in the Home Falls can cause injuries. They can happen to people of all ages. There are many things you can do to make your home safe and to help prevent falls. What can I do on the outside of my home? Regularly fix the edges of walkways and driveways and fix any cracks. Remove anything that might make you trip as you walk through a door, such as a raised step or threshold. Trim any bushes or trees on the path to your home. Use bright outdoor lighting. Clear any walking paths of anything that might make someone trip, such as rocks or tools. Regularly check to see if handrails are loose or broken. Make sure that both sides of any steps have handrails. Any raised decks and porches should have guardrails on the edges. Have any leaves, snow, or ice cleared regularly. Use sand or salt on walking paths during winter. Clean up any spills in your garage right away. This includes oil or grease spills. What can I do in the bathroom? Use night lights. Install grab bars by the toilet and in the tub and shower. Do not use towel bars as grab bars. Use non-skid mats or decals in the tub or shower. If you need to sit down  in the shower, use a plastic, non-slip stool. Keep the floor dry. Clean up any water that spills on the floor as soon as it happens. Remove soap buildup in the tub or shower regularly. Attach bath mats securely with double-sided non-slip rug tape. Do not have throw rugs and other things on the floor that can make you trip. What can I do in the bedroom? Use night lights. Make sure that you have a light by your bed that is easy to reach. Do not use any sheets or blankets that are too big for your bed. They should not hang down onto the floor. Have a firm chair that has side arms. You can use this for support while you get dressed. Do not have throw rugs and other things on the floor that can make you trip. What can I do in the kitchen? Clean up any spills right away. Avoid walking on wet floors. Keep items that you use a lot in easy-to-reach places. If you need to reach something above you, use a strong step stool that has a grab bar. Keep electrical cords out of the way. Do not use floor polish or wax that makes floors slippery. If you must use wax, use non-skid floor wax. Do not have throw rugs and other things on the floor that can make you trip. What can I do with my  stairs? Do not leave any items on the stairs. Make sure that there are handrails on both sides of the stairs and use them. Fix handrails that are broken or loose. Make sure that handrails are as long as the stairways. Check any carpeting to make sure that it is firmly attached to the stairs. Fix any carpet that is loose or worn. Avoid having throw rugs at the top or bottom of the stairs. If you do have throw rugs, attach them to the floor with carpet tape. Make sure that you have a light switch at the top of the stairs and the bottom of the stairs. If you do not have them, ask someone to add them for you. What else can I do to help prevent falls? Wear shoes that: Do not have high heels. Have rubber bottoms. Are comfortable  and fit you well. Are closed at the toe. Do not wear sandals. If you use a stepladder: Make sure that it is fully opened. Do not climb a closed stepladder. Make sure that both sides of the stepladder are locked into place. Ask someone to hold it for you, if possible. Clearly mark and make sure that you can see: Any grab bars or handrails. First and last steps. Where the edge of each step is. Use tools that help you move around (mobility aids) if they are needed. These include: Canes. Walkers. Scooters. Crutches. Turn on the lights when you go into a dark area. Replace any light bulbs as soon as they burn out. Set up your furniture so you have a clear path. Avoid moving your furniture around. If any of your floors are uneven, fix them. If there are any pets around you, be aware of where they are. Review your medicines with your doctor. Some medicines can make you feel dizzy. This can increase your chance of falling. Ask your doctor what other things that you can do to help prevent falls. This information is not intended to replace advice given to you by your health care provider. Make sure you discuss any questions you have with your health care provider. Document Released: 08/30/2009 Document Revised: 04/10/2016 Document Reviewed: 12/08/2014 Elsevier Interactive Patient Education  2017 [employer].

## 2022-01-02 ENCOUNTER — Ambulatory Visit (INDEPENDENT_AMBULATORY_CARE_PROVIDER_SITE_OTHER): Payer: Medicare HMO

## 2022-01-02 DIAGNOSIS — E782 Mixed hyperlipidemia: Secondary | ICD-10-CM

## 2022-01-02 DIAGNOSIS — C61 Malignant neoplasm of prostate: Secondary | ICD-10-CM

## 2022-01-02 DIAGNOSIS — I10 Essential (primary) hypertension: Secondary | ICD-10-CM

## 2022-01-02 DIAGNOSIS — D508 Other iron deficiency anemias: Secondary | ICD-10-CM

## 2022-01-02 NOTE — Patient Instructions (Addendum)
Visit Information  Patient Goals:  Protect My Health (Patient) Complete ADLs daily as able  Timeframe:  Short-Term Goal Priority:  Medium Progress: On Track Start Date:        01/02/22                  Expected End Date:        03/31/22            Follow Up Date  03/03/22 at 1:00 PM   Protect My Health (Patient)  Complete ADLs daily as able   Why is this important?   Screening tests can find diseases early when they are easier to treat.  Your doctor or nurse will talk with you about which tests are important for you.  Getting shots for common diseases like the flu and shingles will help prevent them.     Patient Coping Skills/Strengths: Has support from his wife, [name] Attends scheduled medical appointments Takes medications as prescribed  Patient Deficits: Pain issues Mobility issues  Patient Goals:  Patient will talk with LCSW in next 30 days to discuss client challenges in doing ADLs daily Patient will attend scheduled medical appointments in next 30 days Patient will allow time to rest and relax in next 30 days -  Follow Up Plan: LCSW to call client or spouse of client on 03/03/22 at 1:00 PM to assess client needs    [name].Ahmaud Duthie MSW, [place] [employer] representative [facility] Care Management [phone]

## 2022-01-02 NOTE — Chronic Care Management (AMB) (Signed)
Chronic Care Management    Clinical Social Work Note  01/02/2022 Name: Joshua Compton. MRN: [id number] DOB: May 24, 1941  Joshua Compton. is a 81 y.o. year old male who is a primary care patient of Stacks, Cletus Gash, MD. The CCM team was consulted to assist the patient with chronic disease management and/or care coordination needs related to: [employer] .   Engaged with patient by telephone for follow up visit in response to provider referral for social work chronic care management and care coordination services.   Consent to Services:  The patient was given information about Chronic Care Management services, agreed to services, and gave verbal consent prior to initiation of services.  Please see initial visit note for detailed documentation.   Patient agreed to services and consent obtained.   Assessment: Review of patient past medical history, allergies, medications, and health status, including review of relevant consultants reports was performed today as part of a comprehensive evaluation and provision of chronic care management and care coordination services.     SDOH (Social Determinants of Health) assessments and interventions performed:  SDOH Interventions    Flowsheet Row Most Recent Value  SDOH Interventions   Stress Interventions Provide Counseling  [client has stress related to insomnia]  Depression Interventions/Treatment  Counseling        Advanced Directives Status: See Vynca application for related entries.  CCM Care Plan  Allergies  Allergen Reactions   Pravastatin Sodium     Muscle aches    Outpatient Encounter Medications as of 01/02/2022  Medication Sig   aspirin EC 81 MG tablet Take 1 tablet (81 mg total) by mouth 2 (two) times daily. To be taken after surgery   dorzolamide-timolol (COSOPT) 22.3-6.8 MG/ML ophthalmic solution Place 1 drop into both eyes daily.    Enalapril-hydroCHLOROthiazide 5-12.5 MG tablet Take 1 tablet by mouth daily.  Take 1 tablet by mouth daily.   felodipine (PLENDIL) 5 MG 24 hr tablet TAKE 1 TABLET BY MOUTH EVERY DAY FOR BLOOD PRESSURE   fluticasone (FLONASE) 50 MCG/ACT nasal spray Place 1 spray into both nostrils 2 (two) times daily as needed for allergies or rhinitis.   pantoprazole (PROTONIX) 40 MG tablet Take 1 tablet (40 mg total) by mouth daily. For heartburn   rOPINIRole (REQUIP) 1 MG tablet Take 1 tablet (1 mg total) by mouth 2 (two) times daily.   rosuvastatin (CRESTOR) 5 MG tablet Take 1 tablet (5 mg total) by mouth daily.   sildenafil (REVATIO) 20 MG tablet Take 2-5 pills at once, orally, with each sexual encounter   No facility-administered encounter medications on file as of 01/02/2022.    Patient Active Problem List   Diagnosis Date Noted   Gastroesophageal reflux disease with esophagitis without hemorrhage 09/10/2021   Primary insomnia 09/10/2021   Vasculogenic erectile dysfunction 09/10/2021   Status post total replacement of left hip 01/07/2021   Primary osteoarthritis of left hip 01/06/2021   Leg cramps 12/11/2020   Thyromegaly 05/19/2017   Neoplasm of scalp 12/08/2013   Rhinitis 12/08/2013   Cough 12/08/2013   Hyperlipidemia    Cancer ([place])    Arthritis    Severe stage glaucoma 03/26/2012   Nuclear sclerosis 03/26/2012   Primary open angle glaucoma 10/27/2011   Essential hypertension 02/04/2011   Degenerative disc disease 02/04/2011   Gastrointestinal bleed 02/04/2011   Anemia 02/04/2011   Prostate cancer ([place]) 02/04/2011    Conditions to be addressed/monitored: monitor client completion of ADLs  Care Plan : LCSW care plan  Updates made by Katha Cabal, LCSW since 01/02/2022 12:00 AM     Problem: Functional Decline      Goal: complete ADLs daily as able.  Allow time to sleep, rest   Start Date: 01/02/2022  Expected End Date: 03/31/2022  This Visit's Progress: On track  Recent Progress: On track  Priority: Medium  Note:   Current barriers:   Patient in  need of assistance with connecting to community resources for possible help in completing ADLs for client and other daily activities of client Patient is unable to independently navigate community resource options without care coordination support Occasional Pain issues Insomnia Decreased energy occasionally  Clinical Goals:  patient will work with SW monthly to address concerns related to ADLs completion of client  Patient will call RNCM or LCSW as needed in next 30 days for CCM program support  Clinical Interventions:  Collaboration with [name], MD regarding development and update of comprehensive plan of care as evidenced by provider attestation and co-signature Assessment of needs, barriers of client Discussed client needs with [name], Brooke Bonito. Reviewed  ambulation of client. Client said he is walking well. When weather is warm, he likes playing golf. Discussed  upcoming medical appointments with Winferd Humphrey Encouraged client  or [name] to call [employer] [facility] [employer] as needed for nursing support for client Discussed socialization of client. He likes to go on fishing activities with his brothers.He likes to go golfing with friends. He enjoys socializing with family memers Discussed insomnia issues. He said he has difficulty sleeping.  He only sleeps a few hours at a time and then wakes up.  Discussed appetite of client and energy level of client Reviewed mood of client. He feels that he is in a stable mood generally. He just talked of needing help related to insomnia issue  Patient Coping Skills/Strengths: Has support from his wife, [name] Attends scheduled medical appointments Takes medications as prescribed  Patient Deficits: Pain issues Mobility issues  Patient Goals:  Patient will talk with LCSW in next 30 days to discuss client challenges in doing ADLs daily Patient will attend scheduled medical appointments in next 30 days Patient will allow time to rest and relax in next 30  days -  Follow Up Plan: LCSW to call client or spouse of client on 03/03/22 at 1:00 PM to assess client needs.        [name].Kehlani Vancamp MSW, [place] [facility] [employer] representative [facility] Care Management [phone]

## 2022-03-03 ENCOUNTER — Ambulatory Visit (INDEPENDENT_AMBULATORY_CARE_PROVIDER_SITE_OTHER): Payer: Medicare HMO | Admitting: Licensed Clinical Social Worker

## 2022-03-03 DIAGNOSIS — D508 Other iron deficiency anemias: Secondary | ICD-10-CM

## 2022-03-03 DIAGNOSIS — E782 Mixed hyperlipidemia: Secondary | ICD-10-CM

## 2022-03-03 DIAGNOSIS — C61 Malignant neoplasm of prostate: Secondary | ICD-10-CM

## 2022-03-03 DIAGNOSIS — I10 Essential (primary) hypertension: Secondary | ICD-10-CM

## 2022-03-03 NOTE — Patient Instructions (Addendum)
Visit Information ? ?Patient goals: Protect My Health (Patient) Complete ADLs daily as able ? ?Timeframe:  Short-Term Goal ?Priority:  Medium ?Progress: On Track ?Start Date:        03/03/22                    ?Expected End Date:        05/29/22           ? ?Follow Up Date:  04/21/22 at 10:00 AM ?  ?Protect My Health (Patient)  Complete ADLs daily as able ?  ?Why is this important?   ?Screening tests can find diseases early when they are easier to treat.  ?Your doctor or nurse will talk with you about which tests are important for you.  ?Getting shots for common diseases like the flu and shingles will help prevent them.   ?  ?Patient Coping Skills/Strengths: ?Has support from his wife, [name] ?Attends scheduled medical appointments ?Takes medications as prescribed ? ?Patient Deficits: ?Pain issues ?Mobility issues ? ?Patient Goals:  ?Patient will talk with LCSW in next 30 days to discuss client challenges in doing ADLs daily ?Patient will attend scheduled medical appointments in next 30 days ?Patient will allow time to rest and relax in next 30 days ?-  ?Follow Up Plan: LCSW to call client or spouse of client on 04/21/22 at 10:00 AM to assess client needs   ? ?[name].Trayvion Embleton MSW, LCSW ?Licensed Clinical Social Worker ?[facility] ?[phone] ?

## 2022-03-03 NOTE — Chronic Care Management (AMB) (Signed)
?Chronic Care Management  ? ? Clinical Social Work Note ? ?03/03/2022 ?Name: Joshua Compton. MRN: [id number] DOB: [birth year] ? ?Joshua Compton. is a 81 y.o. year old male who is a primary care patient of Stacks, Cletus Gash, MD. The CCM team was consulted to assist the patient with chronic disease management and/or care coordination needs related to: [employer] .  ? ?Engaged with patient by telephone for follow up visit in response to provider referral for social work chronic care management and care coordination services.  ? ?Consent to Services:  ?The patient was given information about Chronic Care Management services, agreed to services, and gave verbal consent prior to initiation of services.  Please see initial visit note for detailed documentation.  ? ?Patient agreed to services and consent obtained.  ? ?Assessment: Review of patient past medical history, allergies, medications, and health status, including review of relevant consultants reports was performed today as part of a comprehensive evaluation and provision of chronic care management and care coordination services.    ? ?SDOH (Social Determinants of Health) assessments and interventions performed:  ?SDOH Interventions   ? ?Flowsheet Row Most Recent Value  ?SDOH Interventions   ?Stress Interventions Provide Counseling  [client has stress related to difficulty sleeping]  ?Depression Interventions/Treatment  Counseling  ? ?  ?  ? ?Advanced Directives Status: See Vynca application for related entries. ? ?CCM Care Plan ? ?Allergies  ?Allergen Reactions  ? Pravastatin Sodium   ?  Muscle aches  ? ? ?Outpatient Encounter Medications as of 03/03/2022  ?Medication Sig  ? aspirin EC 81 MG tablet Take 1 tablet (81 mg total) by mouth 2 (two) times daily. To be taken after surgery  ? dorzolamide-timolol (COSOPT) 22.3-6.8 MG/ML ophthalmic solution Place 1 drop into both eyes daily.   ? Enalapril-hydroCHLOROthiazide 5-12.5 MG tablet Take 1 tablet by  mouth daily. Take 1 tablet by mouth daily.  ? felodipine (PLENDIL) 5 MG 24 hr tablet TAKE 1 TABLET BY MOUTH EVERY DAY FOR BLOOD PRESSURE  ? fluticasone (FLONASE) 50 MCG/ACT nasal spray Place 1 spray into both nostrils 2 (two) times daily as needed for allergies or rhinitis.  ? pantoprazole (PROTONIX) 40 MG tablet Take 1 tablet (40 mg total) by mouth daily. For heartburn  ? rOPINIRole (REQUIP) 1 MG tablet Take 1 tablet (1 mg total) by mouth 2 (two) times daily.  ? rosuvastatin (CRESTOR) 5 MG tablet Take 1 tablet (5 mg total) by mouth daily.  ? sildenafil (REVATIO) 20 MG tablet Take 2-5 pills at once, orally, with each sexual encounter  ? ?No facility-administered encounter medications on file as of 03/03/2022.  ? ? ?Patient Active Problem List  ? Diagnosis Date Noted  ? Gastroesophageal reflux disease with esophagitis without hemorrhage 09/10/2021  ? Primary insomnia 09/10/2021  ? Vasculogenic erectile dysfunction 09/10/2021  ? Status post total replacement of left hip 01/07/2021  ? Primary osteoarthritis of left hip 01/06/2021  ? Leg cramps 12/11/2020  ? Thyromegaly 05/19/2017  ? Neoplasm of scalp 12/08/2013  ? Rhinitis 12/08/2013  ? Cough 12/08/2013  ? Hyperlipidemia   ? Cancer [facility])   ? Arthritis   ? Severe stage glaucoma 03/26/2012  ? Nuclear sclerosis 03/26/2012  ? Primary open angle glaucoma 10/27/2011  ? Essential hypertension 02/04/2011  ? Degenerative disc disease 02/04/2011  ? Gastrointestinal bleed 02/04/2011  ? Anemia 02/04/2011  ? Prostate cancer ([place]) 02/04/2011  ? ? ?Conditions to be addressed/monitored: monitor client completion of ADLs ? ? ?Care Plan : LCSW care  plan  ?Updates made by Katha Cabal, LCSW since 03/03/2022 12:00 AM  ?  ? ?Problem: Functional Decline   ?  ? ?Goal: complete ADLs daily as able.  Allow time to sleep, rest   ?Start Date: 03/03/2022  ?Expected End Date: 05/29/2022  ?This Visit's Progress: On track  ?Recent Progress: On track  ?Priority: Medium  ?Note:   ?Current barriers:    ?Patient in need of assistance with connecting to community resources for possible help in completing ADLs for client and other daily activities of client ?Patient is unable to independently navigate community resource options without care coordination support ?Occasional Pain issues ?Insomnia ?Decreased energy occasionally ? ?Clinical Goals:  ?patient will work with SW monthly to address concerns related to ADLs completion of client  ?Patient will call RNCM or LCSW as needed in next 30 days for CCM program support ? ?Clinical Interventions:  ?Collaboration with [name], MD regarding development and update of comprehensive plan of care as evidenced by provider attestation and co-signature ?Assessment of needs, barriers of client ?Discussed client needs with [name], Brooke Bonito. ?Reviewed  ambulation of client. Client said he is walking well. When weather is warm, he likes playing golf. ?Encouraged client  or [name] to call RNCM as needed for nursing support for client ?Discussed socialization of client. He likes to go on fishing activities with his brothers.He likes to go golfing with friends. He enjoys socializing with family memers ?Discussed insomnia issues. He said he has difficulty sleeping.  He only sleeps a few hours at a time and then wakes up.  ?Discussed appetite of client and energy level of client ?Reviewed mood of client. He feels that he is in a stable mood generally. He has support from his spouse. He likes spending time with family members. ?Discussed medication procurement of client ? ?Patient Coping Skills/Strengths: ?Has support from his wife, [name] ?Attends scheduled medical appointments ?Takes medications as prescribed ? ?Patient Deficits: ?Pain issues ?Mobility issues ? ?Patient Goals:  ?Patient will talk with LCSW in next 30 days to discuss client challenges in doing ADLs daily ?Patient will attend scheduled medical appointments in next 30 days ?Patient will allow time to rest  and relax in next 30 days ?-  ?Follow Up Plan: LCSW to call client or spouse of client on 04/21/22 at 10:00 AM to assess client needs.    ?  ?  ?[name].Daley Gosse MSW, LCSW ?Licensed Clinical Social Worker ?[facility] ?[phone] ?

## 2022-03-16 DIAGNOSIS — D508 Other iron deficiency anemias: Secondary | ICD-10-CM

## 2022-03-16 DIAGNOSIS — I10 Essential (primary) hypertension: Secondary | ICD-10-CM

## 2022-03-16 DIAGNOSIS — C61 Malignant neoplasm of prostate: Secondary | ICD-10-CM

## 2022-03-16 DIAGNOSIS — E782 Mixed hyperlipidemia: Secondary | ICD-10-CM

## 2022-04-21 ENCOUNTER — Ambulatory Visit (INDEPENDENT_AMBULATORY_CARE_PROVIDER_SITE_OTHER): Payer: Medicare HMO | Admitting: Licensed Clinical Social Worker

## 2022-04-21 DIAGNOSIS — C61 Malignant neoplasm of prostate: Secondary | ICD-10-CM

## 2022-04-21 DIAGNOSIS — E782 Mixed hyperlipidemia: Secondary | ICD-10-CM

## 2022-04-21 DIAGNOSIS — D508 Other iron deficiency anemias: Secondary | ICD-10-CM

## 2022-04-21 DIAGNOSIS — I10 Essential (primary) hypertension: Secondary | ICD-10-CM

## 2022-04-21 NOTE — Chronic Care Management (AMB) (Signed)
Chronic Care Management    Clinical Social Work Note  04/21/2022 Name: Joshua Compton. MRN: [id number] DOB: [birth year]  Joshua Compton. is a 81 y.o. year old male who is a primary care patient of Stacks, Cletus Gash, MD. The CCM team was consulted to assist the patient with chronic disease management and/or care coordination needs related to: [employer] .   Engaged with patient by telephone for follow up visit in response to provider referral for social work chronic care management and care coordination services.   Consent to Services:  The patient was given information about Chronic Care Management services, agreed to services, and gave verbal consent prior to initiation of services.  Please see initial visit note for detailed documentation.   Patient agreed to services and consent obtained.   Assessment: Review of patient past medical history, allergies, medications, and health status, including review of relevant consultants reports was performed today as part of a comprehensive evaluation and provision of chronic care management and care coordination services.     SDOH (Social Determinants of Health) assessments and interventions performed:  SDOH Interventions    Flowsheet Row Most Recent Value  SDOH Interventions   Stress Interventions Provide Counseling  [client has stress related to difficulty sleeping]  Depression Interventions/Treatment  Counseling        Advanced Directives Status: See Vynca application for related entries.  CCM Care Plan  Allergies  Allergen Reactions   Pravastatin Sodium     Muscle aches    Outpatient Encounter Medications as of 04/21/2022  Medication Sig   aspirin EC 81 MG tablet Take 1 tablet (81 mg total) by mouth 2 (two) times daily. To be taken after surgery   dorzolamide-timolol (COSOPT) 22.3-6.8 MG/ML ophthalmic solution Place 1 drop into both eyes daily.    Enalapril-hydroCHLOROthiazide 5-12.5 MG tablet Take 1 tablet by mouth  daily. Take 1 tablet by mouth daily.   felodipine (PLENDIL) 5 MG 24 hr tablet TAKE 1 TABLET BY MOUTH EVERY DAY FOR BLOOD PRESSURE   fluticasone (FLONASE) 50 MCG/ACT nasal spray Place 1 spray into both nostrils 2 (two) times daily as needed for allergies or rhinitis.   pantoprazole (PROTONIX) 40 MG tablet Take 1 tablet (40 mg total) by mouth daily. For heartburn   rOPINIRole (REQUIP) 1 MG tablet Take 1 tablet (1 mg total) by mouth 2 (two) times daily.   rosuvastatin (CRESTOR) 5 MG tablet Take 1 tablet (5 mg total) by mouth daily.   sildenafil (REVATIO) 20 MG tablet Take 2-5 pills at once, orally, with each sexual encounter   No facility-administered encounter medications on file as of 04/21/2022.    Patient Active Problem List   Diagnosis Date Noted   Gastroesophageal reflux disease with esophagitis without hemorrhage 09/10/2021   Primary insomnia 09/10/2021   Vasculogenic erectile dysfunction 09/10/2021   Status post total replacement of left hip 01/07/2021   Primary osteoarthritis of left hip 01/06/2021   Leg cramps 12/11/2020   Thyromegaly 05/19/2017   Neoplasm of scalp 12/08/2013   Rhinitis 12/08/2013   Cough 12/08/2013   Hyperlipidemia    Cancer ([place])    Arthritis    Severe stage glaucoma 03/26/2012   Nuclear sclerosis 03/26/2012   Primary open angle glaucoma 10/27/2011   Essential hypertension 02/04/2011   Degenerative disc disease 02/04/2011   Gastrointestinal bleed 02/04/2011   Anemia 02/04/2011   Prostate cancer ([place]) 02/04/2011    Conditions to be addressed/monitored: monitor client management of ADLs  Care Plan : LCSW care plan  Updates made by Katha Cabal, LCSW since 04/21/2022 12:00 AM     Problem: Functional Decline      Goal: complete ADLs daily as able.  Allow time to sleep, rest   Start Date: 03/03/2022  Expected End Date: 07/10/2022  This Visit's Progress: On track  Recent Progress: On track  Priority: Medium  Note:   Current barriers:   Patient  in need of assistance with connecting to community resources for possible help in completing ADLs for client and other daily activities of client Patient is unable to independently navigate community resource options without care coordination support Occasional Pain issues Insomnia Decreased energy occasionally  Clinical Goals:  patient will work with SW monthly to address concerns related to ADLs completion of client  Patient will call RNCM or LCSW as needed in next 30 days for CCM program support Client will attend scheduled medical appointments in next 30 days  Clinical Interventions:  Collaboration with [name], MD regarding development and update of comprehensive plan of care as evidenced by provider attestation and co-signature Assessment of needs, barriers of client Discussed client needs with [name], Brooke Bonito. Reviewed  ambulation of client. Client said he is walking well. When weather is warm, he likes playing golf. Encouraged client  or [name] to call RNCM as needed for nursing support for client Discussed socialization of client. He likes to go on fishing activities with his brothers.He likes to go golfing with friends. He enjoys socializing with family memers Discussed insomnia issues. He said he has difficulty sleeping.  He only sleeps a few hours at a time and then wakes up.  Discussed appetite of client and energy level of client Reviewed mood of client. He feels that he is in a stable mood generally. He has support from his spouse. He likes spending time with family members. Discussed medication procurement of client Client informed LCSW that client has prescribed medications. He is eating well, He has support from spouse, [name]. He said the main issue for him at this time is insomnia.  He sleeps for a few hours at night and then wakes up, difficulty going back to sleep.  Patient Coping Skills/Strengths: Has support from his wife, [name] Attends scheduled  medical appointments Takes medications as prescribed  Patient Deficits: Pain issues Mobility issues  Patient Goals:  Patient will talk with LCSW in next 30 days to discuss client challenges in doing ADLs daily Patient will attend scheduled medical appointments in next 30 days Patient will allow time to rest and relax in next 30 days -  Follow Up Plan: LCSW to call client or spouse of client on 05/26/22 at 1:00 PM         [name].Zyrus Hetland MSW, [place] [employer] representative [facility] Care Management [phone]

## 2022-04-21 NOTE — Patient Instructions (Addendum)
Visit Information  Patient Goal: Protect My Health (Patient). Complete ADLs daily as able  Timeframe:  Short-Term Goal Priority:  Medium Progress: On Track Start Date:        03/03/22                    Expected End Date:        07/10/22              Follow Up Date:  05/26/22 at 1:00 PM    Protect My Health (Patient)  Complete ADLs daily as able   Why is this important?   Screening tests can find diseases early when they are easier to treat.  Your doctor or nurse will talk with you about which tests are important for you.  Getting shots for common diseases like the flu and shingles will help prevent them.     Patient Coping Skills/Strengths: Has support from his wife, [name] Attends scheduled medical appointments Takes medications as prescribed  Patient Deficits: Pain issues Mobility issues  Patient Goals:  Patient will talk with LCSW in next 30 days to discuss client challenges in doing ADLs daily Patient will attend scheduled medical appointments in next 30 days Patient will allow time to rest and relax in next 30 days -  Follow Up Plan: LCSW to call client or spouse of client on 05/26/22 at 1:00 PM   [name].Rawley Harju MSW, LCSW Licensed Clinical Social Worker [facility] Care Management [phone]

## 2022-05-16 DIAGNOSIS — D508 Other iron deficiency anemias: Secondary | ICD-10-CM

## 2022-05-16 DIAGNOSIS — E782 Mixed hyperlipidemia: Secondary | ICD-10-CM

## 2022-05-16 DIAGNOSIS — I10 Essential (primary) hypertension: Secondary | ICD-10-CM

## 2022-05-16 DIAGNOSIS — C61 Malignant neoplasm of prostate: Secondary | ICD-10-CM

## 2022-05-26 ENCOUNTER — Ambulatory Visit (INDEPENDENT_AMBULATORY_CARE_PROVIDER_SITE_OTHER): Payer: Medicare HMO | Admitting: Licensed Clinical Social Worker

## 2022-05-26 DIAGNOSIS — I10 Essential (primary) hypertension: Secondary | ICD-10-CM

## 2022-05-26 DIAGNOSIS — D508 Other iron deficiency anemias: Secondary | ICD-10-CM

## 2022-05-26 DIAGNOSIS — C61 Malignant neoplasm of prostate: Secondary | ICD-10-CM

## 2022-05-26 DIAGNOSIS — E782 Mixed hyperlipidemia: Secondary | ICD-10-CM

## 2022-05-26 NOTE — Chronic Care Management (AMB) (Signed)
Chronic Care Management    Clinical Social Work Note  05/26/2022 Name: Joshua Compton. MRN: [id number] DOB: 1941/09/11  Joshua Compton. is a 81 y.o. year old male who is a primary care patient of Joshua Compton, Joshua Gash, MD. The CCM team was consulted to assist the patient with chronic disease management and/or care coordination needs related to: [employer] .   Engaged with patient by telephone for follow up visit in response to provider referral for social work chronic care management and care coordination services.   Consent to Services:  The patient was given information about Chronic Care Management services, agreed to services, and gave verbal consent prior to initiation of services.  Please see initial visit note for detailed documentation.   Patient agreed to services and consent obtained.   Assessment: Review of patient past medical history, allergies, medications, and health status, including review of relevant consultants reports was performed today as part of a comprehensive evaluation and provision of chronic care management and care coordination services.     SDOH (Social Determinants of Health) assessments and interventions performed:  SDOH Interventions    Flowsheet Row Most Recent Value  SDOH Interventions   Stress Interventions Provide Counseling  [client has occasional pain issues]  Depression Interventions/Treatment  Counseling        Advanced Directives Status: See Vyn[employer] application for related entries.  CCM Care Plan  Allergies  Allergen Reactions   Pravastatin Sodium     Muscle aches    Outpatient Encounter Medications as of 05/26/2022  Medication Sig   aspirin EC 81 MG tablet Take 1 tablet (81 mg total) by mouth 2 (two) times daily. To be taken after surgery   dorzolamide-timolol (COSOPT) 22.3-6.8 MG/ML ophthalmic solution Place 1 drop into both eyes daily.    Enalapril-hydroCHLOROthiazide 5-12.5 MG tablet Take 1 tablet by mouth daily. Take  1 tablet by mouth daily.   felodipine (PLENDIL) 5 MG 24 hr tablet TAKE 1 TABLET BY MOUTH EVERY DAY FOR BLOOD PRESSURE   fluticasone (FLONASE) 50 MCG/ACT nasal spray Place 1 spray into both nostrils 2 (two) times daily as needed for allergies or rhinitis.   pantoprazole (PROTONIX) 40 MG tablet Take 1 tablet (40 mg total) by mouth daily. For heartburn   rOPINIRole (REQUIP) 1 MG tablet Take 1 tablet (1 mg total) by mouth 2 (two) times daily.   rosuvastatin (CRESTOR) 5 MG tablet Take 1 tablet (5 mg total) by mouth daily.   sildenafil (REVATIO) 20 MG tablet Take 2-5 pills at once, orally, with each sexual encounter   No facility-administered encounter medications on file as of 05/26/2022.    Patient Active Problem List   Diagnosis Date Noted   Gastroesophageal reflux disease with esophagitis without hemorrhage 09/10/2021   Primary insomnia 09/10/2021   Vasculogenic erectile dysfunction 09/10/2021   Status post total replacement of left hip 01/07/2021   Primary osteoarthritis of left hip 01/06/2021   Leg cramps 12/11/2020   Thyromegaly 05/19/2017   Neoplasm of scalp 12/08/2013   Rhinitis 12/08/2013   Cough 12/08/2013   Hyperlipidemia    Cancer ([place])    Arthritis    Severe stage glaucoma 03/26/2012   Nuclear sclerosis 03/26/2012   Primary open angle glaucoma 10/27/2011   Essential hypertension 02/04/2011   Degenerative disc disease 02/04/2011   Gastrointestinal bleed 02/04/2011   Anemia 02/04/2011   Prostate cancer ([place]) 02/04/2011    Conditions to be addressed/monitored: monitor ADLs completion of client  Care Plan : LCSW care plan  Updates  made by Joshua Cabal, LCSW since 05/26/2022 12:00 AM     Problem: Functional Decline      Goal: complete ADLs daily as able.  Allow time to sleep, rest   Start Date: 03/03/2022  Expected End Date: 07/10/2022  This Visit's Progress: On track  Recent Progress: On track  Priority: Medium  Note:   Current barriers:   Patient in need of  assistance with connecting to community resources for possible help in completing ADLs for client and other daily activities of client Patient is unable to independently navigate community resource options without care coordination support Occasional Pain issues Insomnia Decreased energy occasionally  Clinical Goals:  patient will work with SW monthly to address concerns related to ADLs completion of client  Patient will call RNCM or LCSW as needed in next 30 days for CCM program support Client will attend scheduled medical appointments in next 30 days  Clinical Interventions:  Collaboration with Joshua Fraise, MD regarding development and update of comprehensive plan of care as evidenced by provider attestation and co-signature Assessment of needs, barriers of client Discussed client needs with Joshua Compton, Joshua Compton. Discussed relaxation activities of client. He likes to go on fishing activities with his brothers.He likes to go golfing with friends. He enjoys socializing with family memers Discussed appetite of client and energy level of client Client reported no mood issues. He has support from his spouse, Joshua Compton. He enjoys socializing with family and friend. Mood is stable Discussed medication procurement of client Informed client that since client had no SW needs at this time and client reported he was doing well, that LCSW would discharge client today from [facility] program. Client agreed to this plan. Client was appreciative of CCM support. LCSW thanked Joshua Compton for his participation in CCM program in past months.  Patient Coping Skills/Strengths: Has support from his wife, Joshua Compton Attends scheduled medical appointments Takes medications as prescribed  Patient Deficits: Pain issues Mobility issues  Patient Goals:   Patient will attend scheduled medical appointments in next 30 days Patient will allow time to rest and relax in next 30 days -  Follow Up Plan: Client SW goals met.  Client is being discharged today from [place].      [name].Joshua Compton MSW, [place] [employer] representative [facility] Care Management [phone]

## 2022-05-26 NOTE — Patient Instructions (Addendum)
Visit Information  Patient Goals:  Protect My Health (Patient).  Complete ADLs daily as able  Timeframe:  Short-Term Goal Priority:  Medium Progress: On Track Start Date:        03/03/22                    Expected End Date:        07/10/22              Follow Up Date:  Client being discharged today from [place] My Health (Patient)  Complete ADLs daily as able   Why is this important?   Screening tests can find diseases early when they are easier to treat.  Your doctor or nurse will talk with you about which tests are important for you.  Getting shots for common diseases like the flu and shingles will help prevent them.     Patient Coping Skills/Strengths: Has support from his wife, [name] Attends scheduled medical appointments Takes medications as prescribed  Patient Deficits: Pain issues Mobility issues  Patient Goals:   Patient will attend scheduled medical appointments in next 30 days Patient will allow time to rest and relax in next 30 days -  Follow Up Plan:  Client being discharged today from [place].Ahlana Slaydon MSW, [place] Holiday representative [facility] Care Management [phone]

## 2022-06-09 IMAGING — RF DG HIP (WITH PELVIS) OPERATIVE*L*
1 series · 2 of 2 positions shown · non-contrast
Comparison: 07/17/2020.

CLINICAL DATA: Left total hip arthroplasty.

EXAM:
DG C-ARM 1-60 MIN; OPERATIVE LEFT HIP WITH PELVIS
FLUOROSCOPY TIME:  Fluoroscopy Time:  34.1 seconds
Radiation Exposure Index (if provided by the fluoroscopic device):
2.6535 mGy

[Series 1: unknown protocol · 0.20mm/px · 2 of 2 slices shown]
[im 1/2]
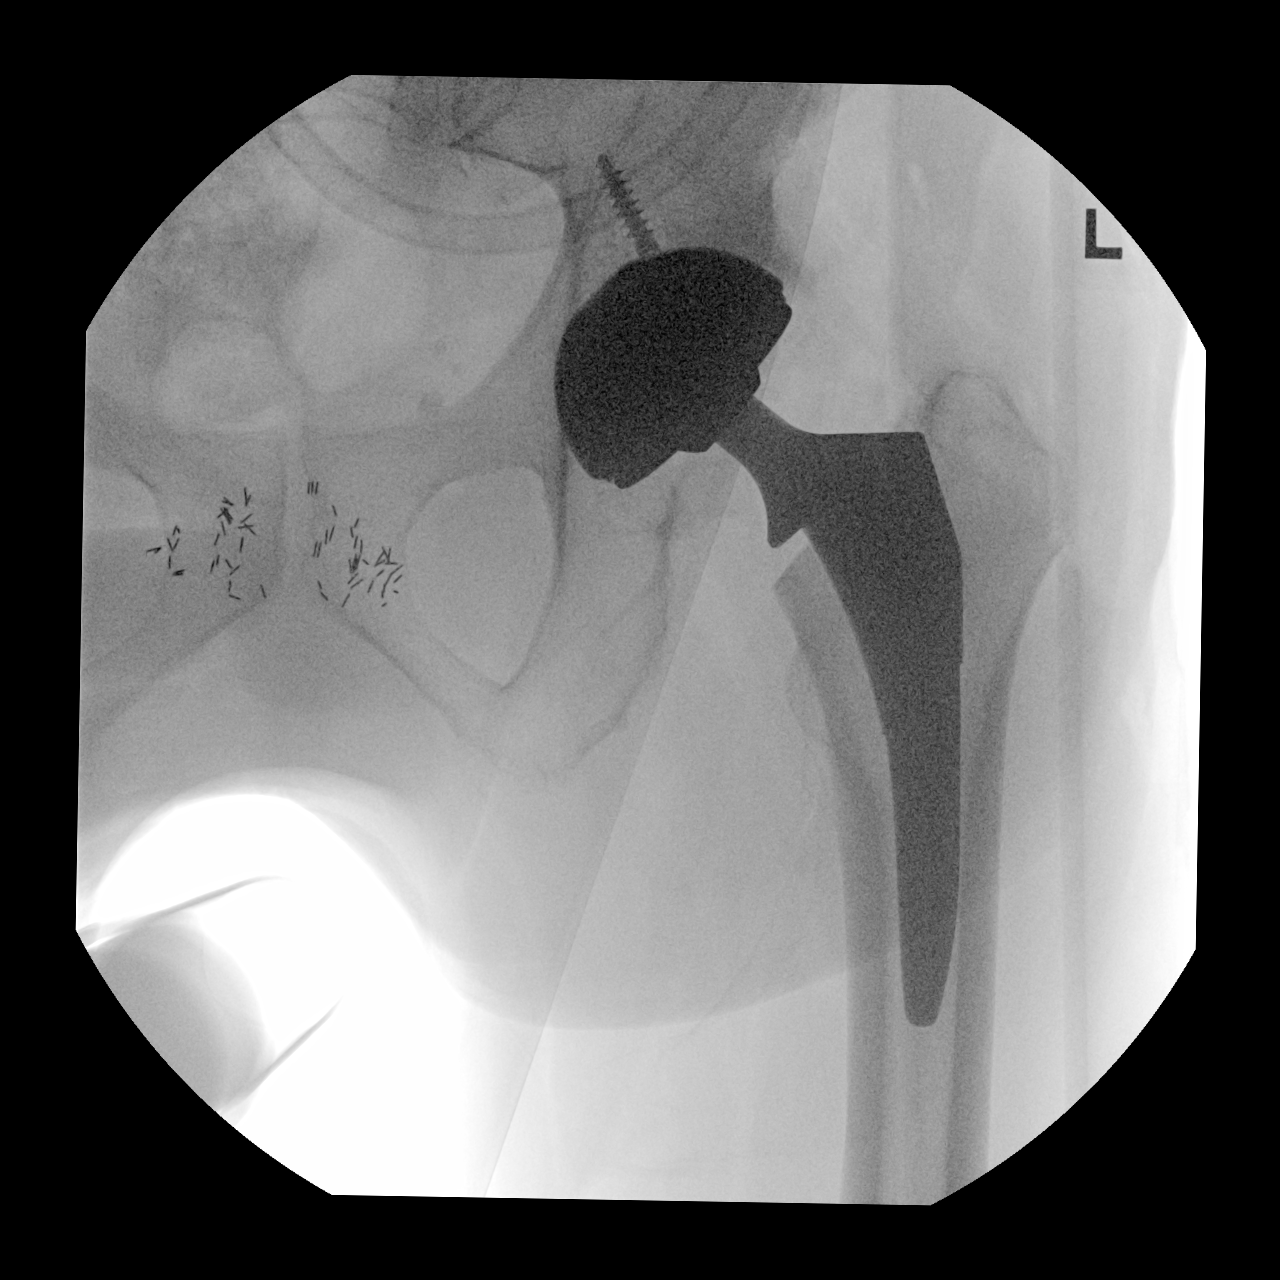
[im 2/2]
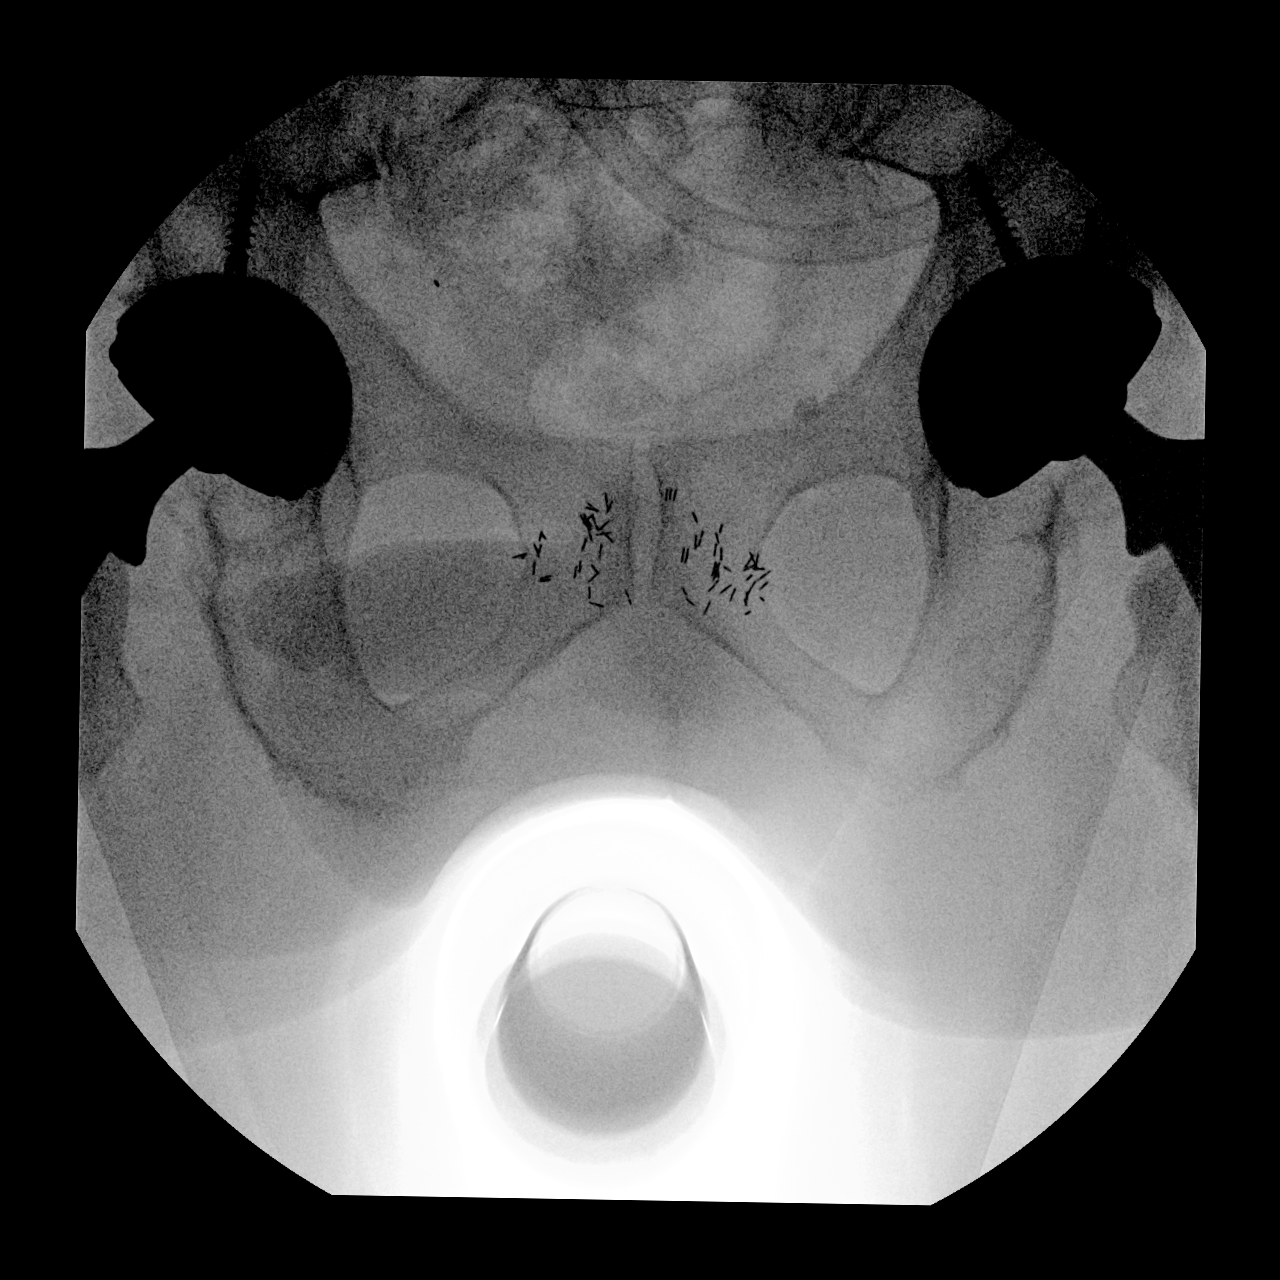

[2 of 2 positions shown; findings below may reference images not displayed]

FINDINGS: Two C-arm fluoroscopic images were obtained intraoperatively and
submitted for post operative interpretation. These images
demonstrate postsurgical changes of left total hip arthroplasty. No
unexpected findings. Partially imaged prior right total hip
arthroplasty. Suspected prostate radiation seeds noted. Please see
the performing provider's procedural report for further detail.
IMPRESSION: Left total hip arthroplasty.

## 2022-06-11 ENCOUNTER — Encounter: Payer: Self-pay | Admitting: Family Medicine

## 2022-06-11 ENCOUNTER — Ambulatory Visit (INDEPENDENT_AMBULATORY_CARE_PROVIDER_SITE_OTHER): Payer: Medicare HMO | Admitting: Family Medicine

## 2022-06-11 VITALS — BP 118/66 | HR 70 | Temp 98.9°F | Ht 72.0 in | Wt 198.0 lb

## 2022-06-11 DIAGNOSIS — E782 Mixed hyperlipidemia: Secondary | ICD-10-CM | POA: Diagnosis not present

## 2022-06-11 DIAGNOSIS — H409 Unspecified glaucoma: Secondary | ICD-10-CM

## 2022-06-11 DIAGNOSIS — R252 Cramp and spasm: Secondary | ICD-10-CM

## 2022-06-11 DIAGNOSIS — Z23 Encounter for immunization: Secondary | ICD-10-CM | POA: Diagnosis not present

## 2022-06-11 DIAGNOSIS — I10 Essential (primary) hypertension: Secondary | ICD-10-CM

## 2022-06-11 DIAGNOSIS — J069 Acute upper respiratory infection, unspecified: Secondary | ICD-10-CM

## 2022-06-11 DIAGNOSIS — K219 Gastro-esophageal reflux disease without esophagitis: Secondary | ICD-10-CM | POA: Diagnosis not present

## 2022-06-11 MED ORDER — ROSUVASTATIN CALCIUM 5 MG PO TABS: 5.0000 mg | ORAL_TABLET | Freq: Every day | ORAL | 3 refills | Status: DC

## 2022-06-11 MED ORDER — FLUTICASONE PROPIONATE 50 MCG/ACT NA SUSP: 1.0000 | Freq: Two times a day (BID) | NASAL | 6 refills | Status: DC | PRN

## 2022-06-11 MED ORDER — ENALAPRIL-HYDROCHLOROTHIAZIDE 5-12.5 MG PO TABS: 1.0000 | ORAL_TABLET | Freq: Every day | ORAL | 3 refills | Status: DC

## 2022-06-11 MED ORDER — FELODIPINE ER 5 MG PO TB24: ORAL_TABLET | ORAL | 3 refills | Status: DC

## 2022-06-11 MED ORDER — ROPINIROLE HCL 1 MG PO TABS: 1.0000 mg | ORAL_TABLET | Freq: Three times a day (TID) | ORAL | 3 refills | Status: DC

## 2022-06-11 MED ORDER — PANTOPRAZOLE SODIUM 40 MG PO TBEC: 40.0000 mg | DELAYED_RELEASE_TABLET | Freq: Every day | ORAL | 3 refills | Status: DC

## 2022-06-11 NOTE — Progress Notes (Addendum)
Subjective:  Patient ID: Joshua Guiles., male    DOB: 1941-01-18  Age: 81 y.o. MRN: [id number]  CC: Medical Management of Chronic Issues   HPI Joshua Compton. presents for follow-up of elevated cholesterol. Doing well without complaints on current medication. Denies side effects of statin including myalgia and arthralgia and nausea. Also in today for liver function testing. Currently no chest pain, shortness of breath or other cardiovascular related symptoms noted.  presents for  follow-up of hypertension. Patient has no history of headache chest pain or shortness of breath or recent cough. Patient also denies symptoms of TIA such as focal numbness or weakness. Patient denies side effects from medication. States taking it regularly.    History Joshua Compton has a past medical history of Arthritis, Cancer ([place]), GERD (gastroesophageal reflux disease), Glaucoma, Hyperlipidemia, and Hypertension.   Joshua Compton has a past surgical history that includes Back surgery; Neck surgery; Transurethral resection of prostate; Hernia repair (11-06-2009); Rotator cuff repair (Left, jan 2016); Colonoscopy; Total hip arthroplasty (Right, 04/20/2020); and Total hip arthroplasty (Left, 01/07/2021).   His family history includes Cancer in his brother, brother, and mother; Heart disease in his father.Joshua Compton reports that Joshua Compton quit smoking about 17 years ago. His smoking use included cigarettes. Joshua Compton has a 15.00 pack-year smoking history. Joshua Compton has never used smokeless tobacco. Joshua Compton reports that Joshua Compton does not drink alcohol and does not use drugs.  Current Outpatient Medications on File Prior to Visit  Medication Sig Dispense Refill   aspirin EC 81 MG tablet Take 1 tablet (81 mg total) by mouth 2 (two) times daily. To be taken after surgery 84 tablet 0   dorzolamide-timolol (COSOPT) 22.3-6.8 MG/ML ophthalmic solution Place 1 drop into both eyes daily.      sildenafil (REVATIO) 20 MG tablet Take 2-5 pills at once, orally, with each  sexual encounter 12 tablet 5   No current facility-administered medications on file prior to visit.    ROS Review of Systems  Constitutional:  Negative for fever.  HENT:  Positive for congestion and postnasal drip.   Respiratory:  Negative for shortness of breath.   Cardiovascular:  Negative for chest pain.       Feet and toes stay cold   Musculoskeletal:  Negative for arthralgias.  Skin:  Negative for rash.    Objective:  BP 118/66   Pulse 70   Temp 98.9 F (37.2 C)   Ht 6' (1.829 m)   Wt 198 lb (89.8 kg)   SpO2 93%   BMI 26.85 kg/m   BP Readings from Last 3 Encounters:  06/11/22 118/66  12/11/21 (!) 134/59  09/10/21 (!) 137/56    Wt Readings from Last 3 Encounters:  06/11/22 198 lb (89.8 kg)  12/25/21 204 lb (92.5 kg)  12/11/21 204 lb 9.6 oz (92.8 kg)     Physical Exam Vitals reviewed.  Constitutional:      Appearance: Joshua Compton is well-developed.  HENT:     Head: Normocephalic and atraumatic.     Right Ear: External ear normal.     Left Ear: External ear normal.     Mouth/Throat:     Pharynx: No oropharyngeal exudate or posterior oropharyngeal erythema.  Eyes:     Pupils: Pupils are equal, round, and reactive to light.  Cardiovascular:     Rate and Rhythm: Normal rate and regular rhythm.     Heart sounds: No murmur heard. Pulmonary:     Effort: No respiratory distress.     Breath sounds: Normal  breath sounds.  Musculoskeletal:     Cervical back: Normal range of motion and neck supple.  Neurological:     Mental Status: Joshua Compton is alert and oriented to person, place, and time.     No results found for: "HGBA1C"  Lab Results  Component Value Date   WBC 6.4 12/11/2021   HGB 14.4 12/11/2021   HCT 42.2 12/11/2021   PLT 212 12/11/2021   GLUCOSE 107 (H) 12/11/2021   CHOL 147 12/11/2021   TRIG 84 12/11/2021   HDL 57 12/11/2021   LDLCALC 74 12/11/2021   ALT 25 12/11/2021   AST 25 12/11/2021   NA 143 12/11/2021   K 4.0 12/11/2021   CL 102 12/11/2021    CREATININE 1.36 (H) 12/11/2021   BUN 21 12/11/2021   CO2 23 12/11/2021   TSH 1.390 05/19/2017   PSA <0.1 07/26/2014   INR 1.0 01/03/2021   MICROALBUR 3.14 (H) 02/09/2013    US SOFT TISSUE HEAD & NECK (NON-THYROID)  Result Date: 06/20/2021 CLINICAL DATA:  81 year old male with a history of thyroid enlargement. Biopsy performed of the right inferior thyroid nodule 06/18/2017 EXAM: THYROID ULTRASOUND TECHNIQUE: Ultrasound examination of the thyroid gland and adjacent soft tissues was performed. COMPARISON:  05/28/2017 FINDINGS: Parenchymal Echotexture: Mildly heterogenous Isthmus: 0.6 cm Right lobe: 7.5 cm x 4.1 cm x 4.1 cm Left lobe: 6.1 cm x 2.7 cm x 2.2 cm _________________________________________________________ Estimated total number of nodules >/= 1 cm: 2 Number of spongiform nodules >/=  2 cm not described below (TR1): 0 Number of mixed cystic and solid nodules >/= 1.5 cm not described below (TR2): 0 _________________________________________________________ Right inferior thyroid nodule measures 3.4 cm compared to 4.2 cm previously. Nodule has been previously biopsied. Assuming a benign result, no further specific follow-up would be indicated. Nodule # 2: Location: Left; Inferior Maximum size: 1.3 cm; Other 2 dimensions: 1.1 cm x 1.2 cm Composition: cystic/almost completely cystic (0) Echogenicity: anechoic (0) Shape: not taller-than-wide (0) Margins: smooth (0) Echogenic foci: none (0) ACR TI-RADS total points: 0. ACR TI-RADS risk category: TR1 (0-1 points). ACR TI-RADS recommendations: Cystic nodule does not meet criteria for surveillance or biopsy _________________________________________________________ No adenopathy Recommendations follow those established by the new ACR TI-RADS criteria (J Am Coll Radiol 2017;14:587-595). IMPRESSION: Redemonstration of multinodular thyroid, as above. Electronically Signed   By: Corrie Mckusick D.O.   On: 06/20/2021 08:33    Assessment & Plan:   Joshua Compton was  seen today for medical management of chronic issues.  Diagnoses and all orders for this visit:  Mixed hyperlipidemia -     CMP14+EGFR -     Lipid panel -     rosuvastatin (CRESTOR) 5 MG tablet; Take 1 tablet (5 mg total) by mouth daily.  Essential hypertension -     CBC with Differential/Platelet -     CMP14+EGFR -     Enalapril-hydroCHLOROthiazide 5-12.5 MG tablet; Take 1 tablet by mouth daily. Take 1 tablet by mouth daily. -     felodipine (PLENDIL) 5 MG 24 hr tablet; TAKE 1 TABLET BY MOUTH EVERY DAY FOR BLOOD PRESSURE  Gastroesophageal reflux disease without esophagitis -     CBC with Differential/Platelet -     pantoprazole (PROTONIX) 40 MG tablet; Take 1 tablet (40 mg total) by mouth daily. For heartburn  Leg cramps  Upper respiratory tract infection, unspecified type -     fluticasone (FLONASE) 50 MCG/ACT nasal spray; Place 1 spray into both nostrils 2 (two) times daily as needed for  allergies or rhinitis.  Glaucoma of left eye, unspecified glaucoma type -     Ambulatory referral to Ophthalmology  Other orders -     rOPINIRole (REQUIP) 1 MG tablet; Take 1 tablet (1 mg total) by mouth 3 (three) times daily.   I have changed [name]'s rOPINIRole. I am also having him maintain his dorzolamide-timolol, aspirin EC, sildenafil, Enalapril-hydroCHLOROthiazide, felodipine, pantoprazole, rosuvastatin, and fluticasone.  Meds ordered this encounter  Medications   Enalapril-hydroCHLOROthiazide 5-12.5 MG tablet    Sig: Take 1 tablet by mouth daily. Take 1 tablet by mouth daily.    Dispense:  90 tablet    Refill:  3   felodipine (PLENDIL) 5 MG 24 hr tablet    Sig: TAKE 1 TABLET BY MOUTH EVERY DAY FOR BLOOD PRESSURE    Dispense:  90 tablet    Refill:  3   pantoprazole (PROTONIX) 40 MG tablet    Sig: Take 1 tablet (40 mg total) by mouth daily. For heartburn    Dispense:  90 tablet    Refill:  3   rosuvastatin (CRESTOR) 5 MG tablet    Sig: Take 1 tablet (5 mg total)  by mouth daily.    Dispense:  90 tablet    Refill:  3   rOPINIRole (REQUIP) 1 MG tablet    Sig: Take 1 tablet (1 mg total) by mouth 3 (three) times daily.    Dispense:  270 tablet    Refill:  3   fluticasone (FLONASE) 50 MCG/ACT nasal spray    Sig: Place 1 spray into both nostrils 2 (two) times daily as needed for allergies or rhinitis.    Dispense:  16 g    Refill:  6     Follow-up: Return in about 6 months (around 12/12/2022) for hypertension.  [name], M.D.

## 2022-06-11 NOTE — Addendum Note (Signed)
Addended by: [name] on: 06/11/2022 09:20 AM   Modules accepted: Orders

## 2022-06-11 NOTE — Addendum Note (Signed)
Addended by: Christia Reading on: 06/11/2022 09:34 AM   Modules accepted: Orders

## 2022-06-11 NOTE — Addendum Note (Signed)
Addended by: [name] on: 06/11/2022 09:16 AM   Modules accepted: Orders

## 2022-06-12 LAB — CMP14+EGFR
ALT: 22 IU/L (ref 0–44)
AST: 20 IU/L (ref 0–40)
Albumin/Globulin Ratio: 1.7 (ref 1.2–2.2)
Albumin: 4.4 g/dL (ref 3.7–4.7)
Alkaline Phosphatase: 93 IU/L (ref 44–121)
BUN/Creatinine Ratio: 16 (ref 10–24)
BUN: 22 mg/dL (ref 8–27)
Bilirubin Total: 0.6 mg/dL (ref 0.0–1.2)
CO2: 23 mmol/L (ref 20–29)
Calcium: 9.6 mg/dL (ref 8.6–10.2)
Chloride: 101 mmol/L (ref 96–106)
Creatinine, Ser: 1.41 mg/dL — ABNORMAL HIGH (ref 0.76–1.27)
Globulin, Total: 2.6 g/dL (ref 1.5–4.5)
Glucose: 114 mg/dL — ABNORMAL HIGH (ref 70–99)
Potassium: 3.9 mmol/L (ref 3.5–5.2)
Sodium: 139 mmol/L (ref 134–144)
Total Protein: 7 g/dL (ref 6.0–8.5)
eGFR: 50 mL/min/{1.73_m2} — ABNORMAL LOW (ref >59)

## 2022-06-12 LAB — CBC WITH DIFFERENTIAL/PLATELET
Basophils Absolute: 0.1 10*3/uL (ref 0.0–0.2)
Basos: 2 %
EOS (ABSOLUTE): 0.2 10*3/uL (ref 0.0–0.4)
Eos: 4 %
Hematocrit: 45.6 % (ref 37.5–51.0)
Hemoglobin: 15.1 g/dL (ref 13.0–17.7)
Immature Grans (Abs): 0 10*3/uL (ref 0.0–0.1)
Immature Granulocytes: 0 %
Lymphocytes Absolute: 1.6 10*3/uL (ref 0.7–3.1)
Lymphs: 36 %
MCH: 30.7 pg (ref 26.6–33.0)
MCHC: 33.1 g/dL (ref 31.5–35.7)
MCV: 93 fL (ref 79–97)
Monocytes Absolute: 0.4 10*3/uL (ref 0.1–0.9)
Monocytes: 9 %
Neutrophils Absolute: 2.2 10*3/uL (ref 1.4–7.0)
Neutrophils: 49 %
Platelets: 186 10*3/uL (ref 150–450)
RBC: 4.92 x10E6/uL (ref 4.14–5.80)
RDW: 12.7 % (ref 11.6–15.4)
WBC: 4.5 10*3/uL (ref 3.4–10.8)

## 2022-06-12 LAB — LIPID PANEL
Chol/HDL Ratio: 2.5 ratio (ref 0.0–5.0)
Cholesterol, Total: 147 mg/dL (ref 100–199)
HDL: 59 mg/dL (ref >39)
LDL Chol Calc (NIH): 71 mg/dL (ref 0–99)
Triglycerides: 93 mg/dL (ref 0–149)
VLDL Cholesterol Cal: 17 mg/dL (ref 5–40)

## 2022-06-12 NOTE — Progress Notes (Signed)
Hello Stephanos,  Your lab result is normal and/or stable.Some minor variations that are not significant are commonly marked abnormal, but do not represent any medical problem for you.  Best regards, Makenli Derstine, M.D.

## 2022-06-16 DIAGNOSIS — D508 Other iron deficiency anemias: Secondary | ICD-10-CM

## 2022-06-16 DIAGNOSIS — E782 Mixed hyperlipidemia: Secondary | ICD-10-CM | POA: Diagnosis not present

## 2022-06-16 DIAGNOSIS — C61 Malignant neoplasm of prostate: Secondary | ICD-10-CM

## 2022-06-16 DIAGNOSIS — I10 Essential (primary) hypertension: Secondary | ICD-10-CM | POA: Diagnosis not present

## 2022-07-16 DIAGNOSIS — E785 Hyperlipidemia, unspecified: Secondary | ICD-10-CM | POA: Diagnosis not present

## 2022-07-16 DIAGNOSIS — I2510 Atherosclerotic heart disease of native coronary artery without angina pectoris: Secondary | ICD-10-CM | POA: Diagnosis not present

## 2022-07-16 DIAGNOSIS — N529 Male erectile dysfunction, unspecified: Secondary | ICD-10-CM | POA: Diagnosis not present

## 2022-07-16 DIAGNOSIS — Z87891 Personal history of nicotine dependence: Secondary | ICD-10-CM | POA: Diagnosis not present

## 2022-07-16 DIAGNOSIS — K219 Gastro-esophageal reflux disease without esophagitis: Secondary | ICD-10-CM | POA: Diagnosis not present

## 2022-07-16 DIAGNOSIS — G2581 Restless legs syndrome: Secondary | ICD-10-CM | POA: Diagnosis not present

## 2022-07-16 DIAGNOSIS — Z8546 Personal history of malignant neoplasm of prostate: Secondary | ICD-10-CM | POA: Diagnosis not present

## 2022-07-16 DIAGNOSIS — I10 Essential (primary) hypertension: Secondary | ICD-10-CM | POA: Diagnosis not present

## 2022-07-16 DIAGNOSIS — Z008 Encounter for other general examination: Secondary | ICD-10-CM | POA: Diagnosis not present

## 2022-07-16 DIAGNOSIS — Z7982 Long term (current) use of aspirin: Secondary | ICD-10-CM | POA: Diagnosis not present

## 2022-07-16 DIAGNOSIS — Z809 Family history of malignant neoplasm, unspecified: Secondary | ICD-10-CM | POA: Diagnosis not present

## 2022-08-26 DIAGNOSIS — H401123 Primary open-angle glaucoma, left eye, severe stage: Secondary | ICD-10-CM | POA: Diagnosis not present

## 2022-08-26 DIAGNOSIS — H2511 Age-related nuclear cataract, right eye: Secondary | ICD-10-CM | POA: Diagnosis not present

## 2022-08-26 DIAGNOSIS — H40021 Open angle with borderline findings, high risk, right eye: Secondary | ICD-10-CM | POA: Diagnosis not present

## 2022-08-26 DIAGNOSIS — Z961 Presence of intraocular lens: Secondary | ICD-10-CM | POA: Diagnosis not present

## 2022-10-16 DIAGNOSIS — H401123 Primary open-angle glaucoma, left eye, severe stage: Secondary | ICD-10-CM | POA: Diagnosis not present

## 2022-10-16 DIAGNOSIS — H40021 Open angle with borderline findings, high risk, right eye: Secondary | ICD-10-CM | POA: Diagnosis not present

## 2022-12-16 ENCOUNTER — Ambulatory Visit (INDEPENDENT_AMBULATORY_CARE_PROVIDER_SITE_OTHER): Payer: Medicare HMO | Admitting: Family Medicine

## 2022-12-16 ENCOUNTER — Encounter: Payer: Self-pay | Admitting: Family Medicine

## 2022-12-16 VITALS — BP 138/71 | HR 62 | Temp 98.1°F | Ht 72.0 in | Wt 203.4 lb

## 2022-12-16 DIAGNOSIS — E782 Mixed hyperlipidemia: Secondary | ICD-10-CM

## 2022-12-16 DIAGNOSIS — R202 Paresthesia of skin: Secondary | ICD-10-CM | POA: Diagnosis not present

## 2022-12-16 DIAGNOSIS — J329 Chronic sinusitis, unspecified: Secondary | ICD-10-CM | POA: Diagnosis not present

## 2022-12-16 DIAGNOSIS — I10 Essential (primary) hypertension: Secondary | ICD-10-CM

## 2022-12-16 DIAGNOSIS — K2100 Gastro-esophageal reflux disease with esophagitis, without bleeding: Secondary | ICD-10-CM | POA: Diagnosis not present

## 2022-12-16 DIAGNOSIS — J40 Bronchitis, not specified as acute or chronic: Secondary | ICD-10-CM

## 2022-12-16 MED ORDER — PREDNISONE 10 MG PO TABS: ORAL_TABLET | ORAL | 0 refills | Status: DC

## 2022-12-16 MED ORDER — ROPINIROLE HCL 1 MG PO TABS: 1.0000 mg | ORAL_TABLET | Freq: Three times a day (TID) | ORAL | 3 refills | Status: DC

## 2022-12-16 MED ORDER — AMOXICILLIN-POT CLAVULANATE 875-125 MG PO TABS: 1.0000 | ORAL_TABLET | Freq: Two times a day (BID) | ORAL | 0 refills | Status: DC

## 2022-12-16 NOTE — Progress Notes (Signed)
Symptoms include congestion, facial pain, nasal congestion, non productive cough, post nasal drip and sinus pressure. There is no fever, chills, or sweats. Onset of symptoms was a few days ago, gradually worsening since that time.    Subjective:  Patient ID: Joshua Compton., male    DOB: [birth year]  Age: 82 y.o. MRN: [id number]  CC: Medical Management of Chronic Issues   HPI Tahmir Kleckner. presents for  follow-up of hypertension. Patient has no history of headache chest pain or shortness of breath or recent cough. Patient also denies symptoms of TIA such as focal numbness or weakness. Patient denies side effects from medication. States taking it regularly.  Symptoms include congestion, nasal congestion, non productive rattling cough, post nasal drip and sinus pressure. There is no fever, chills, or sweats. Onset of symptoms was a few days ago, gradually worsening since that time.   Patient in for follow-up of GERD. Currently asymptomatic taking  PPI daily. There is no chest pain or heartburn. No hematemesis and no melena. No dysphagia or choking. Onset is remote. Progression is stable. Complicating factors, none.  Still no answers as to why his feet are cold and numb. Wants them back to normal. Hx of plantar fascitis treated by podiatry, but this time there is no pain, which Dr. Amalia Hailey had noted as the presenting symptom at the podiatry eval. 4 years ago. Seen by Dr. Sherren Mocha Early 2 yeas ago who evaluated circulation and said the arteries were intact. Pt. Denies edema.   History Gunther has a past medical history of Arthritis, Cancer ([place]), GERD (gastroesophageal reflux disease), Glaucoma, Hyperlipidemia, and Hypertension.   He has a past surgical history that includes Back surgery; Neck surgery; Transurethral resection of prostate; Hernia repair (11-06-2009); Rotator cuff repair (Left, jan 2016); Colonoscopy; Total hip arthroplasty (Right, 04/20/2020); and Total hip arthroplasty (Left,  01/07/2021).   His family history includes Cancer in his brother, brother, and mother; Heart disease in his father.He reports that he quit smoking about 17 years ago. His smoking use included cigarettes. He has a 15.00 pack-year smoking history. He has never used smokeless tobacco. He reports that he does not drink alcohol and does not use drugs.  Current Outpatient Medications on File Prior to Visit  Medication Sig Dispense Refill   aspirin EC 81 MG tablet Take 1 tablet (81 mg total) by mouth 2 (two) times daily. To be taken after surgery 84 tablet 0   dorzolamide-timolol (COSOPT) 22.3-6.8 MG/ML ophthalmic solution Place 1 drop into both eyes daily.      Enalapril-hydroCHLOROthiazide 5-12.5 MG tablet Take 1 tablet by mouth daily. Take 1 tablet by mouth daily. 90 tablet 3   felodipine (PLENDIL) 5 MG 24 hr tablet TAKE 1 TABLET BY MOUTH EVERY DAY FOR BLOOD PRESSURE 90 tablet 3   fluticasone (FLONASE) 50 MCG/ACT nasal spray Place 1 spray into both nostrils 2 (two) times daily as needed for allergies or rhinitis. 16 g 6   pantoprazole (PROTONIX) 40 MG tablet Take 1 tablet (40 mg total) by mouth daily. For heartburn 90 tablet 3   rosuvastatin (CRESTOR) 5 MG tablet Take 1 tablet (5 mg total) by mouth daily. 90 tablet 3   sildenafil (REVATIO) 20 MG tablet Take 2-5 pills at once, orally, with each sexual encounter 12 tablet 5   No current facility-administered medications on file prior to visit.    ROS Review of Systems  Constitutional:  Negative for fever.  Respiratory:  Negative for shortness of breath.   Cardiovascular:  Negative for chest pain.  Musculoskeletal:  Negative for arthralgias.  Skin:  Negative for rash.  Neurological:  Positive for numbness (in feet).    Objective:  BP 138/71   Pulse 62   Temp 98.1 F (36.7 C)   Ht 6' (1.829 m)   Wt 203 lb 6.4 oz (92.3 kg)   SpO2 95%   BMI 27.59 kg/m   BP Readings from Last 3 Encounters:  12/16/22 138/71  06/11/22 118/66  12/11/21 (!)  134/59    Wt Readings from Last 3 Encounters:  12/16/22 203 lb 6.4 oz (92.3 kg)  06/11/22 198 lb (89.8 kg)  12/25/21 204 lb (92.5 kg)     Physical Exam Vitals reviewed.  Constitutional:      Appearance: He is well-developed.  HENT:     Head: Normocephalic and atraumatic.     Right Ear: Tympanic membrane and external ear normal.     Left Ear: Tympanic membrane and external ear normal.     Nose: Congestion present.     Mouth/Throat:     Pharynx: Oropharynx is clear. No oropharyngeal exudate or posterior oropharyngeal erythema.  Eyes:     Pupils: Pupils are equal, round, and reactive to light.  Cardiovascular:     Rate and Rhythm: Normal rate and regular rhythm.     Heart sounds: No murmur heard. Pulmonary:     Effort: No respiratory distress.     Breath sounds: Rhonchi (slight bronchitic character) present.  Musculoskeletal:     Cervical back: Normal range of motion and neck supple.  Neurological:     Mental Status: He is alert and oriented to person, place, and time.     Comments: Light touch intact at toes bilaterally and balls of the feet. DP pulses 2+.        Assessment & Plan:   Tadd was seen today for medical management of chronic issues.  Diagnoses and all orders for this visit:  Essential hypertension -     CBC with Differential/Platelet -     CMP14+EGFR  Mixed hyperlipidemia -     Lipid panel  Paresthesia of both feet -     Ambulatory referral to Neurology  Other orders -     rOPINIRole (REQUIP) 1 MG tablet; Take 1 tablet (1 mg total) by mouth 3 (three) times daily. Plus an extra tablet at bedtime to total four a day -     amoxicillin-clavulanate (AUGMENTIN) 875-125 MG tablet; Take 1 tablet by mouth 2 (two) times daily. Take all of this medication -     predniSONE (DELTASONE) 10 MG tablet; Take 5 daily for 3 days followed by 4,3,2 and 1 for 3 days each.   Allergies as of 12/16/2022       Reactions   Pravastatin Sodium    Muscle aches         Medication List        Accurate as of December 16, 2022  9:37 AM. If you have any questions, ask your nurse or doctor.          amoxicillin-clavulanate 875-125 MG tablet Commonly known as: AUGMENTIN Take 1 tablet by mouth 2 (two) times daily. Take all of this medication Started by: [name], MD   aspirin EC 81 MG tablet Take 1 tablet (81 mg total) by mouth 2 (two) times daily. To be taken after surgery   dorzolamide-timolol 2-0.5 % ophthalmic solution Commonly known as: COSOPT Place 1 drop into both eyes daily.   Enalapril-hydroCHLOROthiazide 5-12.5  MG tablet Take 1 tablet by mouth daily. Take 1 tablet by mouth daily.   felodipine 5 MG 24 hr tablet Commonly known as: PLENDIL TAKE 1 TABLET BY MOUTH EVERY DAY FOR BLOOD PRESSURE   fluticasone 50 MCG/ACT nasal spray Commonly known as: FLONASE Place 1 spray into both nostrils 2 (two) times daily as needed for allergies or rhinitis.   pantoprazole 40 MG tablet Commonly known as: PROTONIX Take 1 tablet (40 mg total) by mouth daily. For heartburn   predniSONE 10 MG tablet Commonly known as: DELTASONE Take 5 daily for 3 days followed by 4,3,2 and 1 for 3 days each. Started by: [name], MD   rOPINIRole 1 MG tablet Commonly known as: REQUIP Take 1 tablet (1 mg total) by mouth 3 (three) times daily. Plus an extra tablet at bedtime to total four a day What changed: additional instructions Changed by: [name], MD   rosuvastatin 5 MG tablet Commonly known as: CRESTOR Take 1 tablet (5 mg total) by mouth daily.   sildenafil 20 MG tablet Commonly known as: REVATIO Take 2-5 pills at once, orally, with each sexual encounter        Meds ordered this encounter  Medications   rOPINIRole (REQUIP) 1 MG tablet    Sig: Take 1 tablet (1 mg total) by mouth 3 (three) times daily. Plus an extra tablet at bedtime to total four a day    Dispense:  360 tablet    Refill:  3   amoxicillin-clavulanate (AUGMENTIN)  875-125 MG tablet    Sig: Take 1 tablet by mouth 2 (two) times daily. Take all of this medication    Dispense:  20 tablet    Refill:  0   predniSONE (DELTASONE) 10 MG tablet    Sig: Take 5 daily for 3 days followed by 4,3,2 and 1 for 3 days each.    Dispense:  45 tablet    Refill:  0      Follow-up: Return in about 6 months (around 06/16/2023), or if symptoms worsen or fail to improve.  [name], M.D.

## 2022-12-17 LAB — CBC WITH DIFFERENTIAL/PLATELET
Basophils Absolute: 0.1 10*3/uL (ref 0.0–0.2)
Basos: 2 %
EOS (ABSOLUTE): 0.1 10*3/uL (ref 0.0–0.4)
Eos: 3 %
Hematocrit: 42.3 % (ref 37.5–51.0)
Hemoglobin: 14.4 g/dL (ref 13.0–17.7)
Immature Grans (Abs): 0 10*3/uL (ref 0.0–0.1)
Immature Granulocytes: 0 %
Lymphocytes Absolute: 1.4 10*3/uL (ref 0.7–3.1)
Lymphs: 29 %
MCH: 30.9 pg (ref 26.6–33.0)
MCHC: 34 g/dL (ref 31.5–35.7)
MCV: 91 fL (ref 79–97)
Monocytes Absolute: 0.5 10*3/uL (ref 0.1–0.9)
Monocytes: 10 %
Neutrophils Absolute: 2.5 10*3/uL (ref 1.4–7.0)
Neutrophils: 56 %
Platelets: 209 10*3/uL (ref 150–450)
RBC: 4.66 x10E6/uL (ref 4.14–5.80)
RDW: 12 % (ref 11.6–15.4)
WBC: 4.6 10*3/uL (ref 3.4–10.8)

## 2022-12-17 LAB — CMP14+EGFR
ALT: 17 IU/L (ref 0–44)
AST: 19 IU/L (ref 0–40)
Albumin/Globulin Ratio: 2.1 (ref 1.2–2.2)
Albumin: 4.5 g/dL (ref 3.7–4.7)
Alkaline Phosphatase: 85 IU/L (ref 44–121)
BUN/Creatinine Ratio: 13 (ref 10–24)
BUN: 16 mg/dL (ref 8–27)
Bilirubin Total: 0.6 mg/dL (ref 0.0–1.2)
CO2: 22 mmol/L (ref 20–29)
Calcium: 9.6 mg/dL (ref 8.6–10.2)
Chloride: 102 mmol/L (ref 96–106)
Creatinine, Ser: 1.2 mg/dL (ref 0.76–1.27)
Globulin, Total: 2.1 g/dL (ref 1.5–4.5)
Glucose: 90 mg/dL (ref 70–99)
Potassium: 4.2 mmol/L (ref 3.5–5.2)
Sodium: 140 mmol/L (ref 134–144)
Total Protein: 6.6 g/dL (ref 6.0–8.5)
eGFR: 61 mL/min/{1.73_m2} (ref >59)

## 2022-12-17 LAB — LIPID PANEL
Chol/HDL Ratio: 2.4 ratio (ref 0.0–5.0)
Cholesterol, Total: 138 mg/dL (ref 100–199)
HDL: 58 mg/dL (ref >39)
LDL Chol Calc (NIH): 63 mg/dL (ref 0–99)
Triglycerides: 91 mg/dL (ref 0–149)
VLDL Cholesterol Cal: 17 mg/dL (ref 5–40)

## 2022-12-21 NOTE — Progress Notes (Signed)
Hello Tarry,  Your lab result is normal and/or stable.Some minor variations that are not significant are commonly marked abnormal, but do not represent any medical problem for you.  Best regards, [name], M.D.

## 2023-01-13 NOTE — Patient Instructions (Signed)
Joshua Compton , Thank you for taking time to come for your Medicare Wellness Visit. I appreciate your ongoing commitment to your health goals. Please review the following plan we discussed and let me know if I can assist you in the future.   These are the goals we discussed:  Goals      Prevent falls        This is a list of the screening recommended for you and due dates:  Health Maintenance  Topic Date Due   COVID-19 Vaccine (3 - Moderna risk series) 02/20/2020   DTaP/Tdap/Td vaccine (2 - Td or Tdap) 07/18/2021   Zoster (Shingles) Vaccine (2 of 2) 08/06/2022   Medicare Annual Wellness Visit  01/15/2024   Pneumonia Vaccine  Completed   Flu Shot  Completed   HPV Vaccine  Aged Out    Advanced directives: Advance directive discussed with you today. I have provided a copy for you to complete at home and have notarized. Once this is complete please bring a copy in to our office so we can scan it into your chart.   Conditions/risks identified: Aim for 30 minutes of exercise or brisk walking, 6-8 glasses of water, and 5 servings of fruits and vegetables each day.   Next appointment: Follow up in one year for your annual wellness visit.   Preventive Care 43 Years and Older, Male  Preventive care refers to lifestyle choices and visits with your health care provider that can promote health and wellness. What does preventive care include? A yearly physical exam. This is also called an annual well check. Dental exams once or twice a year. Routine eye exams. Ask your health care provider how often you should have your eyes checked. Personal lifestyle choices, including: Daily care of your teeth and gums. Regular physical activity. Eating a healthy diet. Avoiding tobacco and drug use. Limiting alcohol use. Practicing safe sex. Taking low doses of aspirin every day. Taking vitamin and mineral supplements as recommended by your health care provider. What happens during an annual well  check? The services and screenings done by your health care provider during your annual well check will depend on your age, overall health, lifestyle risk factors, and family history of disease. Counseling  Your health care provider may ask you questions about your: Alcohol use. Tobacco use. Drug use. Emotional well-being. Home and relationship well-being. Sexual activity. Eating habits. History of falls. Memory and ability to understand (cognition). Work and work Statistician. Screening  You may have the following tests or measurements: Height, weight, and BMI. Blood pressure. Lipid and cholesterol levels. These may be checked every 5 years, or more frequently if you are over 30 years old. Skin check. Lung cancer screening. You may have this screening every year starting at age 81 if you have a 30-pack-year history of smoking and currently smoke or have quit within the past 15 years. Fecal occult blood test (FOBT) of the stool. You may have this test every year starting at age 18. Flexible sigmoidoscopy or colonoscopy. You may have a sigmoidoscopy every 5 years or a colonoscopy every 10 years starting at age 49. Prostate cancer screening. Recommendations will vary depending on your family history and other risks. Hepatitis C blood test. Hepatitis B blood test. Sexually transmitted disease (STD) testing. Diabetes screening. This is done by checking your blood sugar (glucose) after you have not eaten for a while (fasting). You may have this done every 1-3 years. Abdominal aortic aneurysm (AAA) screening. You may need  this if you are a current or former smoker. Osteoporosis. You may be screened starting at age 80 if you are at high risk. Talk with your health care provider about your test results, treatment options, and if necessary, the need for more tests. Vaccines  Your health care provider may recommend certain vaccines, such as: Influenza vaccine. This is recommended every  year. Tetanus, diphtheria, and acellular pertussis (Tdap, Td) vaccine. You may need a Td booster every 10 years. Zoster vaccine. You may need this after age 63. Pneumococcal 13-valent conjugate (PCV13) vaccine. One dose is recommended after age 52. Pneumococcal polysaccharide (PPSV23) vaccine. One dose is recommended after age 46. Talk to your health care provider about which screenings and vaccines you need and how often you need them. This information is not intended to replace advice given to you by your health care provider. Make sure you discuss any questions you have with your health care provider. Document Released: 11/30/2015 Document Revised: 07/23/2016 Document Reviewed: 09/04/2015 Elsevier Interactive Patient Education  2017 [place] Prevention in the Home Falls can cause injuries. They can happen to people of all ages. There are many things you can do to make your home safe and to help prevent falls. What can I do on the outside of my home? Regularly fix the edges of walkways and driveways and fix any cracks. Remove anything that might make you trip as you walk through a door, such as a raised step or threshold. Trim any bushes or trees on the path to your home. Use bright outdoor lighting. Clear any walking paths of anything that might make someone trip, such as rocks or tools. Regularly check to see if handrails are loose or broken. Make sure that both sides of any steps have handrails. Any raised decks and porches should have guardrails on the edges. Have any leaves, snow, or ice cleared regularly. Use sand or salt on walking paths during winter. Clean up any spills in your garage right away. This includes oil or grease spills. What can I do in the bathroom? Use night lights. Install grab bars by the toilet and in the tub and shower. Do not use towel bars as grab bars. Use non-skid mats or decals in the tub or shower. If you need to sit down in the shower, use a  plastic, non-slip stool. Keep the floor dry. Clean up any water that spills on the floor as soon as it happens. Remove soap buildup in the tub or shower regularly. Attach bath mats securely with double-sided non-slip rug tape. Do not have throw rugs and other things on the floor that can make you trip. What can I do in the bedroom? Use night lights. Make sure that you have a light by your bed that is easy to reach. Do not use any sheets or blankets that are too big for your bed. They should not hang down onto the floor. Have a firm chair that has side arms. You can use this for support while you get dressed. Do not have throw rugs and other things on the floor that can make you trip. What can I do in the kitchen? Clean up any spills right away. Avoid walking on wet floors. Keep items that you use a lot in easy-to-reach places. If you need to reach something above you, use a strong step stool that has a grab bar. Keep electrical cords out of the way. Do not use floor polish or wax that makes floors  slippery. If you must use wax, use non-skid floor wax. Do not have throw rugs and other things on the floor that can make you trip. What can I do with my stairs? Do not leave any items on the stairs. Make sure that there are handrails on both sides of the stairs and use them. Fix handrails that are broken or loose. Make sure that handrails are as long as the stairways. Check any carpeting to make sure that it is firmly attached to the stairs. Fix any carpet that is loose or worn. Avoid having throw rugs at the top or bottom of the stairs. If you do have throw rugs, attach them to the floor with carpet tape. Make sure that you have a light switch at the top of the stairs and the bottom of the stairs. If you do not have them, ask someone to add them for you. What else can I do to help prevent falls? Wear shoes that: Do not have high heels. Have rubber bottoms. Are comfortable and fit you  well. Are closed at the toe. Do not wear sandals. If you use a stepladder: Make sure that it is fully opened. Do not climb a closed stepladder. Make sure that both sides of the stepladder are locked into place. Ask someone to hold it for you, if possible. Clearly mark and make sure that you can see: Any grab bars or handrails. First and last steps. Where the edge of each step is. Use tools that help you move around (mobility aids) if they are needed. These include: Canes. Walkers. Scooters. Crutches. Turn on the lights when you go into a dark area. Replace any light bulbs as soon as they burn out. Set up your furniture so you have a clear path. Avoid moving your furniture around. If any of your floors are uneven, fix them. If there are any pets around you, be aware of where they are. Review your medicines with your doctor. Some medicines can make you feel dizzy. This can increase your chance of falling. Ask your doctor what other things that you can do to help prevent falls. This information is not intended to replace advice given to you by your health care provider. Make sure you discuss any questions you have with your health care provider. Document Released: 08/30/2009 Document Revised: 04/10/2016 Document Reviewed: 12/08/2014 Elsevier Interactive Patient Education  2017 [employer].

## 2023-01-13 NOTE — Progress Notes (Unsigned)
Subjective:   Joshua Compton. is a 82 y.o. male who presents for Medicare Annual/Subsequent preventive examination.  Review of Systems    ***       Objective:    There were no vitals filed for this visit. There is no height or weight on file to calculate BMI.     12/25/2021    3:45 PM 01/07/2021   12:50 PM 01/03/2021   10:03 AM 12/24/2020   10:52 AM 04/17/2020    8:25 AM 02/21/2020    9:19 AM 01/29/2020   11:36 AM  Advanced Directives  Does Patient Have a Medical Advance Directive? Yes No No No No No Yes  Type of Paramedic of [place];Living will        Does patient want to make changes to medical advance directive?       No - Patient declined  Copy of [place] in Chart? No - copy requested        Would patient like information on creating a medical advance directive?  No - Patient declined No - Patient declined No - Patient declined Yes (MAU/Ambulatory/Procedural Areas - Information given) No - Patient declined     Current Medications (verified) Outpatient Encounter Medications as of 01/14/2023  Medication Sig   amoxicillin-clavulanate (AUGMENTIN) 875-125 MG tablet Take 1 tablet by mouth 2 (two) times daily. Take all of this medication   aspirin EC 81 MG tablet Take 1 tablet (81 mg total) by mouth 2 (two) times daily. To be taken after surgery   dorzolamide-timolol (COSOPT) 22.3-6.8 MG/ML ophthalmic solution Place 1 drop into both eyes daily.    Enalapril-hydroCHLOROthiazide 5-12.5 MG tablet Take 1 tablet by mouth daily. Take 1 tablet by mouth daily.   felodipine (PLENDIL) 5 MG 24 hr tablet TAKE 1 TABLET BY MOUTH EVERY DAY FOR BLOOD PRESSURE   fluticasone (FLONASE) 50 MCG/ACT nasal spray Place 1 spray into both nostrils 2 (two) times daily as needed for allergies or rhinitis.   pantoprazole (PROTONIX) 40 MG tablet Take 1 tablet (40 mg total) by mouth daily. For heartburn   predniSONE (DELTASONE) 10 MG tablet Take 5 daily for 3 days  followed by 4,3,2 and 1 for 3 days each.   rOPINIRole (REQUIP) 1 MG tablet Take 1 tablet (1 mg total) by mouth 3 (three) times daily. Plus an extra tablet at bedtime to total four a day   rosuvastatin (CRESTOR) 5 MG tablet Take 1 tablet (5 mg total) by mouth daily.   sildenafil (REVATIO) 20 MG tablet Take 2-5 pills at once, orally, with each sexual encounter   No facility-administered encounter medications on file as of 01/14/2023.    Allergies (verified) Pravastatin sodium   History: Past Medical History:  Diagnosis Date   Arthritis    Cancer ([place])    prostate ca   seed implants   GERD (gastroesophageal reflux disease)    Glaucoma    Hyperlipidemia    Hypertension    dr Mariane Duval   fm   Past Surgical History:  Procedure Laterality Date   BACK SURGERY     x2   COLONOSCOPY     HERNIA REPAIR  11-06-2009   left inguinal repair    NECK SURGERY     ROTATOR CUFF REPAIR Left jan 2016   TOTAL HIP ARTHROPLASTY Right 04/20/2020   Procedure: RIGHT TOTAL HIP ARTHROPLASTY ANTERIOR APPROACH;  Surgeon: Leandrew Koyanagi, MD;  Location: [place];  Service:  Orthopedics;  Laterality: Right;   TOTAL HIP ARTHROPLASTY Left 01/07/2021   Procedure: LEFT TOTAL HIP ARTHROPLASTY ANTERIOR APPROACH;  Surgeon: Leandrew Koyanagi, MD;  Location: [facility];  Service: Orthopedics;  Laterality: Left;   TRANSURETHRAL RESECTION OF PROSTATE     Family History  Problem Relation Age of Onset   Cancer Mother        breast   Heart disease Father    Cancer Brother    Cancer Brother    Social History   Socioeconomic History   Marital status: Married    Spouse name: Not on file   Number of children: 5   Years of education: 11   Highest education level: 11th grade  Occupational History   Occupation: Paramedic    Comment: retired  Tobacco Use   Smoking status: Former    Packs/day: 0.50    Years: 30.00    Total pack years: 15.00    Types: Cigarettes    Quit date: 03/28/2005    Years since quitting:  17.8   Smokeless tobacco: Never  Vaping Use   Vaping Use: Never used  Substance and Sexual Activity   Alcohol use: No    Alcohol/week: 0.0 standard drinks of alcohol   Drug use: No   Sexual activity: Yes  Other Topics Concern   Not on file  Social History Narrative   Grandson lives with them - no stairs   Social Determinants of Health   Financial Resource Strain: Low Risk  (12/25/2021)   Overall Financial Resource Strain (CARDIA)    Difficulty of Paying Living Expenses: Not hard at all  Food Insecurity: No Food Insecurity (12/25/2021)   Hunger Vital Sign    Worried About Running Out of Food in the Last Year: Never true    [place] in the Last Year: Never true  Transportation Needs: No Transportation Needs (12/25/2021)   PRAPARE - Hydrologist (Medical): No    Lack of Transportation (Non-Medical): No  Physical Activity: Insufficiently Active (12/25/2021)   Exercise Vital Sign    Days of Exercise per Week: 7 days    Minutes of Exercise per Session: 20 min  Stress: Stress Concern Present (05/26/2022)   [place]    Feeling of Stress : To some extent  Social Connections: Socially Integrated (12/25/2021)   Social Connection and Isolation Panel [NHANES]    Frequency of Communication with Friends and Family: More than three times a week    Frequency of Social Gatherings with Friends and Family: More than three times a week    Attends Religious Services: More than 4 times per year    Active Member of [employer] or Organizations: Yes    Attends Music therapist: More than 4 times per year    Marital Status: Married    Tobacco Counseling Counseling given: Not Answered   Clinical Intake:                 Diabetic?No          Activities of Daily Living     No data to display          Patient Care Team: [name], MD as PCP - General (Family  Medicine) Juanita Craver, MD as Consulting Physician (Gastroenterology) Jovita Gamma, MD as Consulting Physician (Neurosurgery) Irine Seal, MD as Attending Physician (Urology) Monna Fam, MD as Consulting Physician (Ophthalmology) Irine Seal, MD as Attending Physician (  Urology)  Indicate any recent Medical Services you may have received from other than [facility] providers in the past year (date may be approximate).     Assessment:   This is a routine wellness examination for [place].  Hearing/Vision screen No results found.  Dietary issues and exercise activities discussed:     Goals Addressed   None    Depression Screen    12/16/2022    8:41 AM 06/11/2022    8:44 AM 05/26/2022    3:53 PM 04/21/2022   10:17 AM 03/03/2022   12:08 PM 01/02/2022    1:51 PM 12/25/2021    3:43 PM  PHQ 2/9 Scores  PHQ - 2 Score 0 0 '2 2 2 2 '$ 0  PHQ- 9 Score   '6 7 7 8     '$ Fall Risk    12/16/2022    8:41 AM 06/11/2022    8:44 AM 12/25/2021    3:44 PM 12/11/2021    9:01 AM 09/10/2021   10:47 AM  Fall Risk   Falls in the past year? 0 0 0 0 0  Number falls in past yr:   0    Injury with Fall?   0    Risk for fall due to :   No Fall Risks    Follow up   Falls prevention discussed      [place]:  Any stairs in or around the home? {YES/NO:21197} If so, are there any without handrails? {YES/NO:21197} Home free of loose throw rugs in walkways, pet beds, electrical cords, etc? {YES/NO:21197} Adequate lighting in your home to reduce risk of falls? {YES/NO:21197}  ASSISTIVE DEVICES UTILIZED TO PREVENT FALLS:  Life alert? {YES/NO:21197} Use of a cane, walker or w/c? {YES/NO:21197} Grab bars in the bathroom? {YES/NO:21197} Shower chair or bench in shower? {YES/NO:21197} Elevated toilet seat or a handicapped toilet? {YES/NO:21197}  TIMED UP AND GO:  Was the test performed? No . Telephonic visit   Cognitive Function:    06/01/2018    3:34 PM 03/29/2015    11:17 AM  MMSE - Mini Mental State Exam  Orientation to time 5 5  Orientation to Place 4 5  Registration 3 3  Attention/ Calculation 4 5  Recall 2 2  Language- name 2 objects 2 2  Language- repeat 1 1  Language- follow 3 step command 3 3  Language- read & follow direction 1 1  Write a sentence 1 1  Copy design 1 1  Total score 27 29        12/24/2020   10:56 AM  6CIT Screen  What Year? 0 points  What month? 0 points  What time? 0 points  Count back from 20 0 points  Months in reverse 0 points  Repeat phrase 0 points  Total Score 0 points    Immunizations Immunization History  Administered Date(s) Administered   Fluad Quad(high Dose 65+) 08/12/2019, 09/04/2020, 07/01/2022   Influenza, High Dose Seasonal PF 08/15/2014, 10/13/2016, 09/10/2017, 10/19/2018, 08/21/2021   Influenza,inj,Quad PF,6+ Mos 09/19/2013   Moderna Sars-Covid-2 Vaccination 12/22/2019, 01/23/2020   Pneumococcal Conjugate-13 08/15/2014   Pneumococcal Polysaccharide-23 07/19/2011   Tdap 07/19/2011   Zoster Recombinat (Shingrix) 06/11/2022    TDAP status: Due, Education has been provided regarding the importance of this vaccine. Advised may receive this vaccine at local pharmacy or Health Dept. Aware to provide a copy of the vaccination record if obtained from local pharmacy or Health Dept. Verbalized acceptance and understanding.  Flu  Vaccine status: Up to date  Pneumococcal vaccine status: Up to date  Covid-19 vaccine status: Information provided on how to obtain vaccines.   Qualifies for Shingles Vaccine? Yes   Zostavax completed No   Shingrix Completed?: No.    Education has been provided regarding the importance of this vaccine. Patient has been advised to call insurance company to determine out of pocket expense if they have not yet received this vaccine. Advised may also receive vaccine at local pharmacy or Health Dept. Verbalized acceptance and understanding.  Screening Tests Health Maintenance   Topic Date Due   COVID-19 Vaccine (3 - Moderna risk series) 02/20/2020   DTaP/Tdap/Td (2 - Td or Tdap) 07/18/2021   Zoster Vaccines- Shingrix (2 of 2) 08/06/2022   Medicare Annual Wellness (AWV)  12/25/2022   Pneumonia Vaccine 45+ Years old  Completed   INFLUENZA VACCINE  Completed   HPV VACCINES  Aged Out    Health Maintenance  Health Maintenance Due  Topic Date Due   COVID-19 Vaccine (3 - Moderna risk series) 02/20/2020   DTaP/Tdap/Td (2 - Td or Tdap) 07/18/2021   Zoster Vaccines- Shingrix (2 of 2) 08/06/2022   Medicare Annual Wellness (AWV)  12/25/2022    Colorectal cancer screening: No longer required.   Lung Cancer Screening: (Low Dose CT Chest recommended if Age 9-80 years, 30 pack-year currently smoking OR have quit w/in 15years.) does not qualify.   Lung Cancer Screening Referral: n/a  Additional Screening:  Hepatitis C Screening: does not qualify  Vision Screening: Recommended annual ophthalmology exams for early detection of glaucoma and other disorders of the eye. Is the patient up to date with their annual eye exam?  {YES/NO:21197} Who is the provider or what is the name of the office in which the patient attends annual eye exams? *** If pt is not established with a provider, would they like to be referred to a provider to establish care? {YES/NO:21197}.   Dental Screening: Recommended annual dental exams for proper oral hygiene  Community Resource Referral / Chronic Care Management: CRR required this visit?  {YES/NO:21197}  CCM required this visit?  {YES/NO:21197}     Plan:     I have personally reviewed and noted the following in the patient's chart:   Medical and social history Use of alcohol, tobacco or illicit drugs  Current medications and supplements including opioid prescriptions. {Opioid Prescriptions:[phone]} Functional ability and status Nutritional status Physical activity Advanced directives List of other  physicians Hospitalizations, surgeries, and ER visits in previous 12 months Vitals Screenings to include cognitive, depression, and falls Referrals and appointments  In addition, I have reviewed and discussed with patient certain preventive protocols, quality metrics, and best practice recommendations. A written personalized care plan for preventive services as well as general preventive health recommendations were provided to patient.     Vanetta Mulders, [place]   [id number]   Due to this being a virtual visit, the after visit summary with patients personalized plan was offered to patient via mail or my-chart. ***Patient declined at this time./ Patient would like to access on my-chart/ per request, patient was mailed a copy of AVS./ Patient preferred to pick up at office at next visit  Nurse Notes: ***

## 2023-01-14 ENCOUNTER — Ambulatory Visit (INDEPENDENT_AMBULATORY_CARE_PROVIDER_SITE_OTHER): Payer: Medicare HMO

## 2023-01-14 VITALS — Ht 72.0 in | Wt 203.0 lb

## 2023-01-14 DIAGNOSIS — Z0000 Encounter for general adult medical examination without abnormal findings: Secondary | ICD-10-CM

## 2023-01-28 ENCOUNTER — Encounter: Payer: Self-pay | Admitting: Diagnostic Neuroimaging

## 2023-01-28 ENCOUNTER — Ambulatory Visit: Payer: Medicare HMO | Admitting: Diagnostic Neuroimaging

## 2023-01-28 VITALS — BP 119/58 | HR 76 | Ht 72.0 in | Wt 200.0 lb

## 2023-01-28 DIAGNOSIS — R202 Paresthesia of skin: Secondary | ICD-10-CM | POA: Diagnosis not present

## 2023-01-28 DIAGNOSIS — R799 Abnormal finding of blood chemistry, unspecified: Secondary | ICD-10-CM | POA: Diagnosis not present

## 2023-01-28 DIAGNOSIS — R200 Anesthesia of skin: Secondary | ICD-10-CM | POA: Diagnosis not present

## 2023-01-28 NOTE — Patient Instructions (Addendum)
  MILD NUMBNESS IN FEET (ddx: neuropathy vs lumbar radiculopathies) - check B12, A1c neuropathy labs - consider MRI lumbar spine in future - consider changing ropinirole to gabapentin for better symptom control - discussed options for aggressive testing and treatments in future if symptoms significantly worsen; will hold off for now as symptoms are mild / minimally progressive, and neuro exam unremarkable

## 2023-01-28 NOTE — Progress Notes (Signed)
[facility]  PATIENT: Joshua Compton. DOB: [birth year]  REFERRING CLINICIAN: [name], MD HISTORY FROM: patient  REASON FOR VISIT: new consult   HISTORICAL  CHIEF COMPLAINT:  Chief Complaint  Patient presents with   New Patient (Initial Visit)    Patient in room #6 and alone. Patient states his feet are always cold with tingling and numbness.    HISTORY OF PRESENT ILLNESS:   82 year old male here for evaluation of numbness, tingling and cold sensation in the feet.  Symptoms have been going on for at least 3 to 4 years.  No major progression since that time.  Some burning and pain in the bottom of his feet.  Has some history of low back pain issues and surgery in the past.  Also has had hip surgeries in the past.    REVIEW OF SYSTEMS: Full 14 system review of systems performed and negative with exception of: as per HPI.  ALLERGIES: Allergies  Allergen Reactions   Pravastatin Sodium     Muscle aches    HOME MEDICATIONS: Outpatient Medications Prior to Visit  Medication Sig Dispense Refill   aspirin EC 81 MG tablet Take 1 tablet (81 mg total) by mouth 2 (two) times daily. To be taken after surgery 84 tablet 0   dorzolamide-timolol (COSOPT) 22.3-6.8 MG/ML ophthalmic solution Place 1 drop into both eyes daily.      Enalapril-hydroCHLOROthiazide 5-12.5 MG tablet Take 1 tablet by mouth daily. Take 1 tablet by mouth daily. 90 tablet 3   felodipine (PLENDIL) 5 MG 24 hr tablet TAKE 1 TABLET BY MOUTH EVERY DAY FOR BLOOD PRESSURE 90 tablet 3   fluticasone (FLONASE) 50 MCG/ACT nasal spray Place 1 spray into both nostrils 2 (two) times daily as needed for allergies or rhinitis. 16 g 6   pantoprazole (PROTONIX) 40 MG tablet Take 1 tablet (40 mg total) by mouth daily. For heartburn 90 tablet 3   rOPINIRole (REQUIP) 1 MG tablet Take 1 tablet (1 mg total) by mouth 3 (three) times daily. Plus an extra tablet at bedtime to total four a day 360 tablet 3    rosuvastatin (CRESTOR) 5 MG tablet Take 1 tablet (5 mg total) by mouth daily. 90 tablet 3   sildenafil (REVATIO) 20 MG tablet Take 2-5 pills at once, orally, with each sexual encounter (Patient not taking: Reported on 01/28/2023) 12 tablet 5   No facility-administered medications prior to visit.    PAST MEDICAL HISTORY: Past Medical History:  Diagnosis Date   Arthritis    Cancer ([place])    prostate ca   seed implants   GERD (gastroesophageal reflux disease)    Glaucoma    Hyperlipidemia    Hypertension    dr Jacelyn Grip     Willeen Niece   fm    PAST SURGICAL HISTORY: Past Surgical History:  Procedure Laterality Date   BACK SURGERY     x2   COLONOSCOPY     HERNIA REPAIR  11-06-2009   left inguinal repair    NECK SURGERY     ROTATOR CUFF REPAIR Left jan 2016   TOTAL HIP ARTHROPLASTY Right 04/20/2020   Procedure: RIGHT TOTAL HIP ARTHROPLASTY ANTERIOR APPROACH;  Surgeon: Leandrew Koyanagi, MD;  Location: [place];  Service: Orthopedics;  Laterality: Right;   TOTAL HIP ARTHROPLASTY Left 01/07/2021   Procedure: LEFT TOTAL HIP ARTHROPLASTY ANTERIOR APPROACH;  Surgeon: Leandrew Koyanagi, MD;  Location: [place];  Service: Orthopedics;  Laterality: Left;   TRANSURETHRAL RESECTION OF PROSTATE  FAMILY HISTORY: Family History  Problem Relation Age of Onset   Cancer Mother        breast   Heart disease Father    Cancer Brother    Cancer Brother     SOCIAL HISTORY: Social History   Socioeconomic History   Marital status: Married    Spouse name: Not on file   Number of children: 5   Years of education: 11   Highest education level: 11th grade  Occupational History   Occupation: Paramedic    Comment: retired  Tobacco Use   Smoking status: Former    Packs/day: 0.50    Years: 30.00    Total pack years: 15.00    Types: Cigarettes    Quit date: 03/28/2005    Years since quitting: 17.8   Smokeless tobacco: Never  Vaping Use   Vaping Use: Never used  Substance and Sexual Activity    Alcohol use: No    Alcohol/week: 0.0 standard drinks of alcohol   Drug use: No   Sexual activity: Yes  Other Topics Concern   Not on file  Social History Narrative   Grandson lives with them - no stairs   Social Determinants of Health   Financial Resource Strain: Low Risk  (01/14/2023)   Overall Financial Resource Strain (CARDIA)    Difficulty of Paying Living Expenses: Not hard at all  Food Insecurity: No Food Insecurity (01/14/2023)   Hunger Vital Sign    Worried About Running Out of Food in the Last Year: Never true    [place] in the Last Year: Never true  Transportation Needs: No Transportation Needs (01/14/2023)   PRAPARE - Hydrologist (Medical): No    Lack of Transportation (Non-Medical): No  Physical Activity: Sufficiently Active (01/14/2023)   Exercise Vital Sign    Days of Exercise per Week: 5 days    Minutes of Exercise per Session: 30 min  Stress: Stress Concern Present (01/14/2023)   [place]    Feeling of Stress : To some extent  Social Connections: Socially Integrated (01/14/2023)   Social Connection and Isolation Panel [NHANES]    Frequency of Communication with Friends and Family: More than three times a week    Frequency of Social Gatherings with Friends and Family: More than three times a week    Attends Religious Services: More than 4 times per year    Active Member of [employer] or Organizations: Yes    Attends Music therapist: More than 4 times per year    Marital Status: Married  Human resources officer Violence: Not At Risk (01/14/2023)   Humiliation, Afraid, Rape, and Kick questionnaire    Fear of Current or Ex-Partner: No    Emotionally Abused: No    Physically Abused: No    Sexually Abused: No     PHYSICAL EXAM  GENERAL EXAM/CONSTITUTIONAL: Vitals:  Vitals:   01/28/23 1135  BP: (!) 119/58  Pulse: 76  Weight: 200 lb (90.7 kg)  Height: 6'  (1.829 m)   Body mass index is 27.12 kg/m. Wt Readings from Last 3 Encounters:  01/28/23 200 lb (90.7 kg)  01/14/23 203 lb (92.1 kg)  12/16/22 203 lb 6.4 oz (92.3 kg)   Patient is in no distress; well developed, nourished and groomed; neck is supple  CARDIOVASCULAR: Examination of carotid arteries is normal; no carotid bruits Regular rate and rhythm, no murmurs Examination of peripheral vascular  system by observation and palpation is normal  EYES: Ophthalmoscopic exam of optic discs and posterior segments is normal; no papilledema or hemorrhages No results found.  MUSCULOSKELETAL: Gait, strength, tone, movements noted in Neurologic exam below  NEUROLOGIC: MENTAL STATUS:     06/01/2018    3:34 PM 03/29/2015   11:17 AM  MMSE - Mini Mental State Exam  Orientation to time 5 5  Orientation to Place 4 5  Registration 3 3  Attention/ Calculation 4 5  Recall 2 2  Language- name 2 objects 2 2  Language- repeat 1 1  Language- follow 3 step command 3 3  Language- read & follow direction 1 1  Write a sentence 1 1  Copy design 1 1  Total score 27 29   awake, alert, oriented to person, place and time recent and remote memory intact normal attention and concentration language fluent, comprehension intact, naming intact fund of knowledge appropriate  CRANIAL NERVE:  2nd - no papilledema on fundoscopic exam 2nd, 3rd, 4th, 6th - pupils equal and reactive to light, visual fields full to confrontation, extraocular muscles intact, no nystagmus 5th - facial sensation symmetric 7th - facial strength symmetric 8th - hearing intact 9th - palate elevates symmetrically, uvula midline 11th - shoulder shrug symmetric 12th - tongue protrusion midline  MOTOR:  normal bulk and tone, full strength in the BUE, BLE  SENSORY:  normal and symmetric to light touch, temperature, vibration, pinprick (including toes)  COORDINATION:  finger-nose-finger, fine finger movements normal  REFLEXES:   deep tendon reflexes TRACE and symmetric  GAIT/STATION:  narrow based gait     DIAGNOSTIC DATA (LABS, IMAGING, TESTING) - I reviewed patient records, labs, notes, testing and imaging myself where available.  Lab Results  Component Value Date   WBC 4.6 12/16/2022   HGB 14.4 12/16/2022   HCT 42.3 12/16/2022   MCV 91 12/16/2022   PLT 209 12/16/2022      Component Value Date/Time   NA 140 12/16/2022 0934   K 4.2 12/16/2022 0934   CL 102 12/16/2022 0934   CO2 22 12/16/2022 0934   GLUCOSE 90 12/16/2022 0934   GLUCOSE 112 (H) 01/08/2021 0555   BUN 16 12/16/2022 0934   CREATININE 1.20 12/16/2022 0934   CALCIUM 9.6 12/16/2022 0934   PROT 6.6 12/16/2022 0934   ALBUMIN 4.5 12/16/2022 0934   AST 19 12/16/2022 0934   ALT 17 12/16/2022 0934   ALKPHOS 85 12/16/2022 0934   BILITOT 0.6 12/16/2022 0934   GFRNONAA 49 (L) 01/08/2021 0555   GFRAA 70 12/11/2020 1124   Lab Results  Component Value Date   CHOL 138 12/16/2022   HDL 58 12/16/2022   LDLCALC 63 12/16/2022   TRIG 91 12/16/2022   CHOLHDL 2.4 12/16/2022   No results found for: "HGBA1C" No results found for: "VITAMINB12" Lab Results  Component Value Date   TSH 1.390 05/19/2017    05/12/12 MRI lumbar spine -Good appearance of the fusion segment of L5-S1.  -Adjacent segment degenerative disease with severe multifactorial  stenosis at L4-5.  There is broad-based disc herniation more  prominent towards the right in combination with facet and  ligamentous hypertrophy.  Foraminal stenosis on the right as well.  -L2-3 and L3-4 show retrolisthesis of 2 mm with shallow protrusion  of disc material more prominent towards the left.  There is  stenosis of the lateral recesses at these levels, more pronounced  on the left than the right.   02/21/20 ABI Right: Resting  right ankle-brachial index is within normal range. No  evidence of significant right lower extremity arterial disease. The right  toe-brachial index is normal. RT  great toe pressure = 132 mmHg.   Left: Resting left ankle-brachial index is within normal range. No  evidence of significant left lower extremity arterial disease. The left  toe-brachial index is normal. LT Great toe pressure = 129 mmHg.     ASSESSMENT AND PLAN  82 y.o. year old male here with:   Dx:  1. Numbness and tingling of both feet     PLAN:  MILD NUMBNESS IN FEET (ddx: neuropathy vs lumbar radiculopathies) - check B12, A1c neuropathy labs - consider MRI lumbar spine - consider changing ropinirole to gabapentin for better symptom control - discussed options for aggressive testing and treatments in future if symptoms significantly worsen; will hold off for now as symptoms are mild / minimally progressive, and neuro exam unremarkable  Orders Placed This Encounter  Procedures   Vitamin B12   Hemoglobin A1c   Return for pending if symptoms worsen or fail to improve, pending test results.    Penni Bombard, MD [id number], [id number] PM Certified in Neurology, Neurophysiology and Neuroimaging  [facility] [address], [place],  [address] [phone]

## 2023-01-29 ENCOUNTER — Telehealth: Payer: Self-pay | Admitting: Neurology

## 2023-01-29 LAB — VITAMIN B12: Vitamin B-12: 454 pg/mL (ref 232–1245)

## 2023-01-29 LAB — HEMOGLOBIN A1C
Est. average glucose Bld gHb Est-mCnc: 137 mg/dL
Hgb A1c MFr Bld: 6.4 % — ABNORMAL HIGH (ref 4.8–5.6)

## 2023-01-29 NOTE — Telephone Encounter (Signed)
-----   Message from Penni Bombard, MD sent at 01/29/2023 11:42 AM EDT ----- A1c slightly elevated (borderline diabetes; sometimes can be associated with neuropathy). Follow up with PCP. B12 is ok. -VRP

## 2023-01-29 NOTE — Telephone Encounter (Signed)
Called the patient and reviewed the lab results. Advised the A1c is borderline. Advised the patient we would send a copy to the pt PCP and he should follow up with PCP to have them keep and eye on that. Neuropathy symptoms can sometimes be caused by patients that have diabetes. Pt verbalized understanding and will follow up with PCP

## 2023-03-13 NOTE — Progress Notes (Signed)
Added diagnosis code R79.9.

## 2023-03-23 ENCOUNTER — Encounter: Payer: Self-pay | Admitting: Nurse Practitioner

## 2023-03-23 ENCOUNTER — Ambulatory Visit (INDEPENDENT_AMBULATORY_CARE_PROVIDER_SITE_OTHER): Payer: Medicare HMO | Admitting: Nurse Practitioner

## 2023-03-23 VITALS — BP 145/68 | HR 71 | Temp 98.1°F | Resp 20 | Ht 72.0 in | Wt 197.2 lb

## 2023-03-23 DIAGNOSIS — J40 Bronchitis, not specified as acute or chronic: Secondary | ICD-10-CM | POA: Diagnosis not present

## 2023-03-23 MED ORDER — FLUTICASONE PROPIONATE 50 MCG/ACT NA SUSP
2.0000 | Freq: Every day | NASAL | 6 refills | Status: DC
Start: 2023-03-23 — End: 2023-09-29

## 2023-03-23 MED ORDER — HYDROCODONE BIT-HOMATROP MBR 5-1.5 MG/5ML PO SOLN
5.0000 mL | Freq: Four times a day (QID) | ORAL | 0 refills | Status: DC | PRN
Start: 2023-03-23 — End: 2023-03-31

## 2023-03-23 NOTE — Progress Notes (Signed)
Subjective:    Patient ID: Joshua Compton., male    DOB: [birth year], 82 y.o.   MRN: [id number]   Chief Complaint: Cough and Nasal Congestion   Cough This is a new problem. The current episode started in the past 7 days. The problem has been gradually improving. The problem occurs every few minutes. The cough is Productive of sputum. Associated symptoms include nasal congestion and rhinorrhea. Pertinent negatives include no chills, ear congestion, ear pain, fever, sore throat or shortness of breath. Nothing aggravates the symptoms. Treatments tried: OTC cough meds.    Patient Active Problem List   Diagnosis Date Noted   Gastroesophageal reflux disease with esophagitis without hemorrhage 09/10/2021   Primary insomnia 09/10/2021   Vasculogenic erectile dysfunction 09/10/2021   Status post total replacement of left hip 01/07/2021   Primary osteoarthritis of left hip 01/06/2021   Leg cramps 12/11/2020   Thyromegaly 05/19/2017   Neoplasm of scalp 12/08/2013   Rhinitis 12/08/2013   Cough 12/08/2013   Hyperlipidemia    Cancer (HCC)    Arthritis    Severe stage glaucoma 03/26/2012   Nuclear sclerosis 03/26/2012   Primary open angle glaucoma 10/27/2011   Essential hypertension 02/04/2011   Degenerative disc disease 02/04/2011   Gastrointestinal bleed 02/04/2011   Anemia 02/04/2011   Prostate cancer (HCC) 02/04/2011       Review of Systems  Constitutional:  Negative for chills and fever.  HENT:  Positive for rhinorrhea. Negative for ear pain and sore throat.   Respiratory:  Positive for cough. Negative for shortness of breath.        Objective:   Physical Exam Constitutional:      Appearance: Normal appearance.  Cardiovascular:     Rate and Rhythm: Normal rate and regular rhythm.     Heart sounds: Normal heart sounds.  Pulmonary:     Effort: Pulmonary effort is normal.     Breath sounds: Normal breath sounds.  Skin:    General: Skin is warm.  Neurological:      General: No focal deficit present.     Mental Status: He is alert and oriented to person, place, and time.  Psychiatric:        Mood and Affect: Mood normal.        Behavior: Behavior normal.    BP (!) 145/68   Pulse 71   Temp 98.1 F (36.7 C) (Temporal)   Resp 20   Ht 6' (1.829 m)   Wt 197 lb 3.2 oz (89.4 kg)   SpO2 92%   BMI 26.75 kg/m         Assessment & Plan:  Joshua Compton. in today with chief complaint of Cough and Nasal Congestion   1. Bronchitis 1. Take meds as prescribed 2. Use a cool mist humidifier especially during the winter months and when heat has been humid. 3. Use saline nose sprays frequently 4. Saline irrigations of the nose can be very helpful if done frequently.  * 4X daily for 1 week*  * Use of a nettie pot can be helpful with this. Follow directions with this* 5. Drink plenty of fluids 6. Keep thermostat turn down low 7.For any cough or congestion- hycodan as prescribed with sedation precautions 8. For fever or aces or pains- take tylenol or ibuprofen appropriate for age and weight.  * for fevers greater than 101 orally you may alternate ibuprofen and tylenol every  3 hours.    - HYDROcodone bit-homatropine (HYCODAN) 5-1.5 MG/5ML syrup;  Take 5 mLs by mouth every 6 (six) hours as needed for cough.  Dispense: 120 mL; Refill: 0 - fluticasone (FLONASE) 50 MCG/ACT nasal spray; Place 2 sprays into both nostrils daily.  Dispense: 16 g; Refill: 6    The above assessment and management plan was discussed with the patient. The patient verbalized understanding of and has agreed to the management plan. Patient is aware to call the clinic if symptoms persist or worsen. Patient is aware when to return to the clinic for a follow-up visit. Patient educated on when it is appropriate to go to the emergency department.   Mary-Margaret Daphine Deutscher, FNP

## 2023-03-23 NOTE — Patient Instructions (Signed)
1. Take meds as prescribed 2. Use a cool mist humidifier especially during the winter months and when heat has been humid. 3. Use saline nose sprays frequently 4. Saline irrigations of the nose can be very helpful if done frequently.  * 4X daily for 1 week*  * Use of a nettie pot can be helpful with this. Follow directions with this* 5. Drink plenty of fluids 6. Keep thermostat turn down low 7.For any cough or congestion- hycodan with sedation precautions 8. For fever or aces or pains- take tylenol or ibuprofen appropriate for age and weight.  * for fevers greater than 101 orally you may alternate ibuprofen and tylenol every  3 hours.    

## 2023-03-31 ENCOUNTER — Encounter: Payer: Self-pay | Admitting: Nurse Practitioner

## 2023-03-31 ENCOUNTER — Ambulatory Visit (INDEPENDENT_AMBULATORY_CARE_PROVIDER_SITE_OTHER): Payer: Medicare HMO

## 2023-03-31 ENCOUNTER — Ambulatory Visit (INDEPENDENT_AMBULATORY_CARE_PROVIDER_SITE_OTHER): Payer: Medicare HMO | Admitting: Nurse Practitioner

## 2023-03-31 VITALS — BP 172/74 | HR 74 | Temp 97.5°F | Resp 20 | Ht 72.0 in | Wt 195.0 lb

## 2023-03-31 DIAGNOSIS — R0602 Shortness of breath: Secondary | ICD-10-CM | POA: Diagnosis not present

## 2023-03-31 DIAGNOSIS — J189 Pneumonia, unspecified organism: Secondary | ICD-10-CM | POA: Diagnosis not present

## 2023-03-31 DIAGNOSIS — R0600 Dyspnea, unspecified: Secondary | ICD-10-CM | POA: Diagnosis not present

## 2023-03-31 MED ORDER — HYDROCODONE BIT-HOMATROP MBR 5-1.5 MG/5ML PO SOLN
5.0000 mL | Freq: Four times a day (QID) | ORAL | 0 refills | Status: DC | PRN
Start: 2023-03-31 — End: 2023-04-30

## 2023-03-31 MED ORDER — AZITHROMYCIN 250 MG PO TABS
ORAL_TABLET | ORAL | 0 refills | Status: DC
Start: 2023-03-31 — End: 2023-04-30

## 2023-03-31 NOTE — Patient Instructions (Signed)
1. Take meds as prescribed 2. Use a cool mist humidifier especially during the winter months and when heat has been humid. 3. Use saline nose sprays frequently 4. Saline irrigations of the nose can be very helpful if done frequently.  * 4X daily for 1 week*  * Use of a nettie pot can be helpful with this. Follow directions with this* 5. Drink plenty of fluids 6. Keep thermostat turn down low 7.For any cough or congestion- hycodan 8. For fever or aces or pains- take tylenol or ibuprofen appropriate for age and weight.  * for fevers greater than 101 orally you may alternate ibuprofen and tylenol every  3 hours.    

## 2023-03-31 NOTE — Progress Notes (Signed)
Subjective:    Patient ID: Joshua Najjar., male    DOB: May 07, 1941, 82 y.o.   MRN: [id number]   Chief Complaint: Cough and Nasal Congestion (Seen 5/6 and treated for bronchitis/)   Patient in c/o cough and congestion. Was seen on 03/23/23 and dx with bronchitis. Was given cough meds and flonase nasal spray. Is no better.   Cough This is a new problem. The current episode started 1 to 4 weeks ago. The problem has been waxing and waning. The problem occurs every few minutes. The cough is Productive of sputum. Associated symptoms include nasal congestion, rhinorrhea and shortness of breath. Pertinent negatives include no ear congestion. Nothing aggravates the symptoms. He has tried OTC cough suppressant for the symptoms. The treatment provided mild relief.    Patient Active Problem List   Diagnosis Date Noted   Gastroesophageal reflux disease with esophagitis without hemorrhage 09/10/2021   Primary insomnia 09/10/2021   Vasculogenic erectile dysfunction 09/10/2021   Status post total replacement of left hip 01/07/2021   Primary osteoarthritis of left hip 01/06/2021   Leg cramps 12/11/2020   Thyromegaly 05/19/2017   Neoplasm of scalp 12/08/2013   Rhinitis 12/08/2013   Cough 12/08/2013   Hyperlipidemia    Cancer (HCC)    Arthritis    Severe stage glaucoma 03/26/2012   Nuclear sclerosis 03/26/2012   Primary open angle glaucoma 10/27/2011   Essential hypertension 02/04/2011   Degenerative disc disease 02/04/2011   Gastrointestinal bleed 02/04/2011   Anemia 02/04/2011   Prostate cancer (HCC) 02/04/2011       Review of Systems  HENT:  Positive for rhinorrhea.   Respiratory:  Positive for cough and shortness of breath.        Objective:   Physical Exam Constitutional:      Appearance: Normal appearance.  Cardiovascular:     Rate and Rhythm: Normal rate and regular rhythm.  Pulmonary:     Breath sounds: Rales (bil lower lobes) present.  Skin:    General: Skin is  warm.  Neurological:     General: No focal deficit present.     Mental Status: He is alert and oriented to person, place, and time.  Psychiatric:        Mood and Affect: Mood normal.        Behavior: Behavior normal.     BP (!) 172/74   Pulse 74   Temp (!) 97.5 F (36.4 C) (Temporal)   Resp 20   Ht 6' (1.829 m)   Wt 195 lb (88.5 kg)   SpO2 91%   BMI 26.45 kg/m   Chest xray- ril lower lobe infiltrate-Preliminary reading by Paulene Floor, FNP  [facility]      Assessment & Plan:   Joshua Najjar. in today with chief complaint of Cough and Nasal Congestion (Seen 5/6 and treated for bronchitis/)   1. SOB (shortness of breath) - DG Chest 2 View  2. Community acquired pneumonia of right lower lobe of lung 1. Take meds as prescribed 2. Use a cool mist humidifier especially during the winter months and when heat has been humid. 3. Use saline nose sprays frequently 4. Saline irrigations of the nose can be very helpful if done frequently.  * 4X daily for 1 week*  * Use of a nettie pot can be helpful with this. Follow directions with this* 5. Drink plenty of fluids 6. Keep thermostat turn down low 7.For any cough or congestion- hycodan 8. For fever or aces or pains-  take tylenol or ibuprofen appropriate for age and weight.  * for fevers greater than 101 orally you may alternate ibuprofen and tylenol every  3 hours.    Meds ordered this encounter  Medications   azithromycin (ZITHROMAX Z-PAK) 250 MG tablet    Sig: As directed    Dispense:  6 tablet    Refill:  0    Order Specific Question:   Supervising Provider    Answer:   Arville Care A [[id number]]   HYDROcodone bit-homatropine (HYCODAN) 5-1.5 MG/5ML syrup    Sig: Take 5 mLs by mouth every 6 (six) hours as needed for cough.    Dispense:  120 mL    Refill:  0    Order Specific Question:   Supervising Provider    Answer:   Arville Care A [[id number]]      The above assessment and management plan was discussed  with the patient. The patient verbalized understanding of and has agreed to the management plan. Patient is aware to call the clinic if symptoms persist or worsen. Patient is aware when to return to the clinic for a follow-up visit. Patient educated on when it is appropriate to go to the emergency department.   Mary-Margaret Daphine Deutscher, FNP

## 2023-04-06 ENCOUNTER — Emergency Department (HOSPITAL_COMMUNITY)
Admission: EM | Admit: 2023-04-06 | Discharge: 2023-04-06 | Disposition: A | Discharge status: Home / Self Care | Payer: Medicare HMO | Attending: Student | Admitting: Student

## 2023-04-06 ENCOUNTER — Other Ambulatory Visit: Payer: Self-pay

## 2023-04-06 ENCOUNTER — Encounter (HOSPITAL_COMMUNITY): Payer: Self-pay | Admitting: Emergency Medicine

## 2023-04-06 ENCOUNTER — Emergency Department (HOSPITAL_COMMUNITY): Payer: Medicare HMO

## 2023-04-06 DIAGNOSIS — I10 Essential (primary) hypertension: Secondary | ICD-10-CM | POA: Insufficient documentation

## 2023-04-06 DIAGNOSIS — J189 Pneumonia, unspecified organism: Secondary | ICD-10-CM

## 2023-04-06 DIAGNOSIS — Z7982 Long term (current) use of aspirin: Secondary | ICD-10-CM | POA: Insufficient documentation

## 2023-04-06 DIAGNOSIS — J439 Emphysema, unspecified: Secondary | ICD-10-CM | POA: Insufficient documentation

## 2023-04-06 DIAGNOSIS — R0602 Shortness of breath: Secondary | ICD-10-CM | POA: Diagnosis not present

## 2023-04-06 DIAGNOSIS — J168 Pneumonia due to other specified infectious organisms: Secondary | ICD-10-CM | POA: Diagnosis not present

## 2023-04-06 DIAGNOSIS — Z7951 Long term (current) use of inhaled steroids: Secondary | ICD-10-CM | POA: Diagnosis not present

## 2023-04-06 DIAGNOSIS — Z7952 Long term (current) use of systemic steroids: Secondary | ICD-10-CM | POA: Diagnosis not present

## 2023-04-06 DIAGNOSIS — Z87891 Personal history of nicotine dependence: Secondary | ICD-10-CM | POA: Diagnosis not present

## 2023-04-06 DIAGNOSIS — E876 Hypokalemia: Secondary | ICD-10-CM | POA: Insufficient documentation

## 2023-04-06 DIAGNOSIS — R059 Cough, unspecified: Secondary | ICD-10-CM | POA: Diagnosis not present

## 2023-04-06 LAB — CBC WITH DIFFERENTIAL/PLATELET
Abs Immature Granulocytes: 0.03 10*3/uL (ref 0.00–0.07)
Basophils Absolute: 0.1 10*3/uL (ref 0.0–0.1)
Basophils Relative: 2 %
Eosinophils Absolute: 0.2 10*3/uL (ref 0.0–0.5)
Eosinophils Relative: 2 %
HCT: 43.1 % (ref 39.0–52.0)
Hemoglobin: 13.9 g/dL (ref 13.0–17.0)
Immature Granulocytes: 0 %
Lymphocytes Relative: 28 %
Lymphs Abs: 2 10*3/uL (ref 0.7–4.0)
MCH: 31.1 pg (ref 26.0–34.0)
MCHC: 32.3 g/dL (ref 30.0–36.0)
MCV: 96.4 fL (ref 80.0–100.0)
Monocytes Absolute: 0.5 10*3/uL (ref 0.1–1.0)
Monocytes Relative: 7 %
Neutro Abs: 4.2 10*3/uL (ref 1.7–7.7)
Neutrophils Relative %: 61 %
Platelets: 353 10*3/uL (ref 150–400)
RBC: 4.47 MIL/uL (ref 4.22–5.81)
RDW: 13.9 % (ref 11.5–15.5)
WBC: 6.9 10*3/uL (ref 4.0–10.5)
nRBC: 0 % (ref 0.0–0.2)

## 2023-04-06 LAB — BASIC METABOLIC PANEL
Anion gap: 9 (ref 5–15)
BUN: 21 mg/dL (ref 8–23)
CO2: 29 mmol/L (ref 22–32)
Calcium: 9.4 mg/dL (ref 8.9–10.3)
Chloride: 102 mmol/L (ref 98–111)
Creatinine, Ser: 1.17 mg/dL (ref 0.61–1.24)
GFR, Estimated: >60 mL/min (ref >60)
Glucose, Bld: 91 mg/dL (ref 70–99)
Potassium: 3.3 mmol/L — ABNORMAL LOW (ref 3.5–5.1)
Sodium: 140 mmol/L (ref 135–145)

## 2023-04-06 LAB — BRAIN NATRIURETIC PEPTIDE: B Natriuretic Peptide: 182 pg/mL — ABNORMAL HIGH (ref 0.0–100.0)

## 2023-04-06 LAB — TROPONIN I (HIGH SENSITIVITY): Troponin I (High Sensitivity): 14 ng/L (ref <18)

## 2023-04-06 MED ORDER — PREDNISONE 10 MG PO TABS: 40.0000 mg | ORAL_TABLET | Freq: Every day | ORAL | 0 refills | Status: AC

## 2023-04-06 MED ORDER — PREDNISONE 20 MG PO TABS
40.0000 mg | ORAL_TABLET | Freq: Once | ORAL | Status: AC
  Administered 2023-04-06: 40 mg via ORAL
  Filled 2023-04-06: qty 2

## 2023-04-06 MED ORDER — AMOXICILLIN-POT CLAVULANATE 875-125 MG PO TABS: 1.0000 | ORAL_TABLET | Freq: Two times a day (BID) | ORAL | 0 refills | Status: AC

## 2023-04-06 MED ORDER — POTASSIUM CHLORIDE CRYS ER 20 MEQ PO TBCR
20.0000 meq | EXTENDED_RELEASE_TABLET | Freq: Once | ORAL | Status: AC
  Administered 2023-04-06: 20 meq via ORAL
  Filled 2023-04-06: qty 1

## 2023-04-06 MED ORDER — IPRATROPIUM-ALBUTEROL 0.5-2.5 (3) MG/3ML IN SOLN
3.0000 mL | Freq: Once | RESPIRATORY_TRACT | Status: AC
  Administered 2023-04-06: 3 mL via RESPIRATORY_TRACT
  Filled 2023-04-06: qty 3

## 2023-04-06 MED ORDER — AMOXICILLIN-POT CLAVULANATE 875-125 MG PO TABS
1.0000 | ORAL_TABLET | Freq: Once | ORAL | Status: AC
  Administered 2023-04-06: 1 via ORAL
  Filled 2023-04-06: qty 1

## 2023-04-06 MED ORDER — ALBUTEROL SULFATE HFA 108 (90 BASE) MCG/ACT IN AERS: 1.0000 | INHALATION_SPRAY | Freq: Four times a day (QID) | RESPIRATORY_TRACT | 0 refills | Status: DC | PRN

## 2023-04-06 MED ORDER — DOXYCYCLINE HYCLATE 100 MG PO TABS
100.0000 mg | ORAL_TABLET | Freq: Once | ORAL | Status: AC
  Administered 2023-04-06: 100 mg via ORAL
  Filled 2023-04-06: qty 1

## 2023-04-06 MED ORDER — DOXYCYCLINE HYCLATE 100 MG PO CAPS: 100.0000 mg | ORAL_CAPSULE | Freq: Two times a day (BID) | ORAL | 0 refills | Status: AC

## 2023-04-06 NOTE — ED Provider Notes (Signed)
[place] EMERGENCY DEPARTMENT AT [facility] Provider Note   CSN: [id number] Arrival date & time: 04/06/23  9562     History  Chief Complaint  Patient presents with   Cough   Back Pain    Joshua Compton. is a 82 y.o. male.  Of high blood pressure high cholesterol.  Presents the ER today complaining of cough for about a week.  He was seen on the 14th by his PCP and had a chest x-ray.  He states he was diagnosed with a "touch of pneumonia".  He was given azithromycin and hide given cough syrup.  He states the cough syrup helped some but he still having frequent cough.  He has completed the antibiotics.  He reports he was still coughing a lot over the weekend, and states he feels like he is short of breath sometimes.  He called his PCP today but they could not see him until tomorrow, so he comes to the ER for further evaluation.   He states last night he had to sleep sitting up due to feeling like he was out of breath lying down.  Denies any chest pain except for soreness all over when coughing.  Denies fevers or chills.  No nausea or vomiting.  Does complain of soreness in his back with coughing.  Cough is occasionally productive of white sputum.  Otherwise has no chest pain  Pt reports he does not smoke, but did smoke cigarettes many years ago   Cough Back Pain      Home Medications Prior to Admission medications   Medication Sig Start Date End Date Taking? Authorizing Provider  albuterol (VENTOLIN HFA) 108 (90 Base) MCG/ACT inhaler Inhale 1-2 puffs into the lungs every 6 (six) hours as needed for wheezing or shortness of breath. 04/06/23  Yes Courtenay Hirth A, PA-C  amoxicillin-clavulanate (AUGMENTIN) 875-125 MG tablet Take 1 tablet by mouth every 12 (twelve) hours for 5 days. 04/06/23 04/11/23 Yes Rayshun Kandler A, PA-C  doxycycline (VIBRAMYCIN) 100 MG capsule Take 1 capsule (100 mg total) by mouth 2 (two) times daily for 5 days. 04/06/23 04/11/23 Yes Kanai Berrios  A, PA-C  predniSONE (DELTASONE) 10 MG tablet Take 4 tablets (40 mg total) by mouth daily for 4 days. 04/06/23 04/10/23 Yes Comer Devins A, PA-C  aspirin EC 81 MG tablet Take 1 tablet (81 mg total) by mouth 2 (two) times daily. To be taken after surgery Patient not taking: Reported on 03/31/2023 01/08/21   Cristie Hem, PA-C  azithromycin (ZITHROMAX Z-PAK) 250 MG tablet As directed 03/31/23   [name] Pierini, FNP  dorzolamide-timolol (COSOPT) 22.3-6.8 MG/ML ophthalmic solution Place 1 drop into both eyes daily.  08/04/16   [provider]  Enalapril-hydroCHLOROthiazide 5-12.5 MG tablet Take 1 tablet by mouth daily. Take 1 tablet by mouth daily. 06/11/22   [name], MD  felodipine (PLENDIL) 5 MG 24 hr tablet TAKE 1 TABLET BY MOUTH EVERY DAY FOR BLOOD PRESSURE 06/11/22   [name], MD  fluticasone [facility]) 50 MCG/ACT nasal spray Place 2 sprays into both nostrils daily. 03/23/23   Daphine Deutscher, Mary-Margaret, FNP  HYDROcodone bit-homatropine (HYCODAN) 5-1.5 MG/5ML syrup Take 5 mLs by mouth every 6 (six) hours as needed for cough. 03/31/23   Daphine Deutscher, Mary-Margaret, FNP  pantoprazole (PROTONIX) 40 MG tablet Take 1 tablet (40 mg total) by mouth daily. For heartburn 06/11/22   [name], MD  rOPINIRole (REQUIP) 1 MG tablet Take 1 tablet (1 mg total) by mouth 3 (three) times  daily. Plus an extra tablet at bedtime to total four a day 12/16/22   [name], MD  rosuvastatin (CRESTOR) 5 MG tablet Take 1 tablet (5 mg total) by mouth daily. 06/11/22   [name], MD  sildenafil (REVATIO) 20 MG tablet Take 2-5 pills at once, orally, with each sexual encounter 09/10/21   [name], MD      Allergies    Pravastatin sodium    Review of Systems   Review of Systems  Respiratory:  Positive for cough.   Musculoskeletal:  Positive for back pain.    Physical Exam Updated Vital Signs BP (!) 148/65   Pulse 69   Temp 98.9 F (37.2 C) (Oral)   Resp (!) 23   SpO2 93%  Physical  Exam Vitals and nursing note reviewed.  Constitutional:      General: He is not in acute distress.    Appearance: He is well-developed.  HENT:     Head: Normocephalic and atraumatic.  Eyes:     Conjunctiva/sclera: Conjunctivae normal.  Cardiovascular:     Rate and Rhythm: Normal rate and regular rhythm.     Heart sounds: No murmur heard. Pulmonary:     Effort: No respiratory distress.     Comments: Mild basilar wheezes bilaterally Abdominal:     Palpations: Abdomen is soft.     Tenderness: There is no abdominal tenderness.  Musculoskeletal:        General: No swelling.     Cervical back: Neck supple.  Skin:    General: Skin is warm and dry.     Capillary Refill: Capillary refill takes less than 2 seconds.  Neurological:     General: No focal deficit present.     Mental Status: He is alert and oriented to person, place, and time.  Psychiatric:        Mood and Affect: Mood normal.     ED Results / Procedures / Treatments   Labs (all labs ordered are listed, but only abnormal results are displayed) Labs Reviewed  BASIC METABOLIC PANEL - Abnormal; Notable for the following components:      Result Value   Potassium 3.3 (*)    All other components within normal limits  BRAIN NATRIURETIC PEPTIDE - Abnormal; Notable for the following components:   B Natriuretic Peptide 182.0 (*)    All other components within normal limits  CBC WITH DIFFERENTIAL/PLATELET  TROPONIN I (HIGH SENSITIVITY)    EKG None  Radiology DG Chest 2 View  Result Date: 04/06/2023 CLINICAL DATA:  82 year old male with cough and shortness of breath. EXAM: CHEST - 2 VIEW COMPARISON:  Chest radiographs 07/01/2023 and earlier. FINDINGS: PA and lateral views at 1006 hours. Centrilobular emphysema demonstrated on 2012 CT. Stable large lung volumes. Mediastinal contours remain within normal limits. Patchy new bilateral lower lung peribronchial opacity from earlier this month. No pneumothorax or pleural effusion.  No consolidation. No acute osseous abnormality identified. Negative visible bowel gas. Visualized tracheal air column is within normal limits. IMPRESSION: Emphysema ([id number]) with patchy new lower lung peribronchial opacity compatible with acute infectious exacerbation. No pleural effusion. Electronically Signed   By: Odessa Fleming M.D.   On: 04/06/2023 10:18    Procedures Procedures    Medications Ordered in ED Medications  potassium chloride SA (KLOR-CON M) CR tablet 20 mEq (has no administration in time range)  doxycycline (VIBRA-TABS) tablet 100 mg (has no administration in time range)  ipratropium-albuterol (DUONEB) 0.5-2.5 (3) MG/3ML nebulizer solution 3 mL (3 mLs  Nebulization Given 04/06/23 1052)  predniSONE (DELTASONE) tablet 40 mg (40 mg Oral Given 04/06/23 1106)  amoxicillin-clavulanate (AUGMENTIN) 875-125 MG per tablet 1 tablet (1 tablet Oral Given 04/06/23 1106)    ED Course/ Medical Decision Making/ A&P Clinical Course as of 04/06/23 1242  Mon Apr 06, 2023  1033 And treated for pneumonia by PCP with azithromycin solo therapy on 5/14.  Still having cough, noted to have some wheezing.  X-ray shows persistence of opacity.  Will give him Augmentin rather than amoxicillin given his findings of COPD on imaging going back to 2012.  Discussed plan for breathing treatment, steroids, antibiotics.  Labs are still pending. [CB]    Clinical Course User Index [CB] Ma Rings, PA-C                             Medical Decision Making This patient presents to the ED for concern of persistent cough, this involves an extensive number of treatment options, and is a complaint that carries with it a high risk of complications and morbidity.  The differential diagnosis includes, COPD exacerbation, pulmonary edema, other   Co morbidities that complicate the patient evaluation  Emphysema, hypertension   Additional history obtained:  Additional history obtained from EMR External records  from outside source obtained and reviewed including PCP visit from 03/31/2023   Lab Tests:  I Ordered, and personally interpreted labs.  The pertinent results include: CBC is normal, BMP shows mild hypokalemia 3.3, troponin is 14, single troponin done as patient has no chest pain, symptoms ongoing for a week,  BNP is 182 this is slightly over normal but given patient's age and lack of pulmonary edema on chest x-ray, no other fluid overload symptoms do not feel this represents elevation secondary to heart failure   Imaging Studies ordered:  I ordered imaging studies including chest x-ray I independently visualized and interpreted imaging which showed bilateral lower patchy opacities I agree with the radiologist interpretation     Problem List / ED Course / Critical interventions / Medication management  Pneumonia-Patient has emphysema on chest x-ray, former smoker.  Having coughing and wheezing that did not improved with azithromycin.  Chest x-ray today's shows bilateral lower lobe patchy infiltrates.  Discussed with patient this is likely persistent pneumonia will treat with Augmentin and doxycycline.  He was also given treatment here with good relief of his cough and shortness of breath.  He is satting 93-96 on room air in the room, was able to ambulate for me on the portable pulse ox and lowest saturation was 90%.  Patient did not feel short of breath when walking he states. Given prednisone and albuterol inhaler for home as well.  Discussed with patient and his daughter strict return precautions. I ordered medication including DuoNeb for wheezing and cough Reevaluation of the patient after these medicines showed that the patient improved I have reviewed the patients home medicines and have made adjustments as needed      Amount and/or Complexity of Data Reviewed Labs: ordered. Radiology: ordered.  Risk Prescription drug management.          Final Clinical Impression(s)  / ED Diagnoses Final diagnoses:  Pneumonia of both lower lobes due to infectious organism    Rx / DC Orders ED Discharge Orders          Ordered    amoxicillin-clavulanate (AUGMENTIN) 875-125 MG tablet  Every 12 hours  04/06/23 1207    doxycycline (VIBRAMYCIN) 100 MG capsule  2 times daily        04/06/23 1210    predniSONE (DELTASONE) 10 MG tablet  Daily        04/06/23 1210    albuterol (VENTOLIN HFA) 108 (90 Base) MCG/ACT inhaler  Every 6 hours PRN        04/06/23 1237              Josem Kaufmann 04/06/23 1242    Kommor, Wyn Forster, MD 04/06/23 1824

## 2023-04-06 NOTE — Discharge Instructions (Addendum)
Seen today for cough.  X-ray shows pneumonia.  Your blood work was reassuring but your potassium was slightly low.  Make sure you are eating foods high in potassium such as bananas, oranges or juice, or potatoes.  Follow-up closely with your primary care doctor.  The antibiotic you are given previously should be used by itself for pneumonia so you are given different antibiotics today.  Your imaging does show some emphysema as well so we will give you some prednisone which should help with the coughing and wheezing.

## 2023-04-06 NOTE — ED Triage Notes (Signed)
Pt c/o being congested x 2 weeks. Gets shob some. Seen pcp last week and given hydrocodone syrup and dx with PNA with abx. Pt states he has not improved and is about the same. C/o pain to sides and chest from coughing. Pt ambulatory. Nad. No resp distress or sob noted.

## 2023-04-09 ENCOUNTER — Telehealth: Payer: Self-pay

## 2023-04-09 NOTE — Transitions of Care (Post Inpatient/ED Visit) (Signed)
   04/09/2023  Name: Joshua Compton. MRN: [id number] DOB: 09/26/1941  Today's TOC FU Call Status: Today's TOC FU Call Status:: Unsuccessul Call (1st Attempt) Unsuccessful Call (1st Attempt) Date: 04/09/23  Attempted to reach the patient regarding the most recent Inpatient/ED visit.  Follow Up Plan: Additional outreach attempts will be made to reach the patient to complete the Transitions of Care (Post Inpatient/ED visit) call.   Jodelle Gross, RN, BSN, CCM Care Management Coordinator [facility] Phone: [phone]/Fax: [phone]

## 2023-04-10 ENCOUNTER — Ambulatory Visit: Payer: Self-pay | Admitting: *Deleted

## 2023-04-10 NOTE — Chronic Care Management (AMB) (Signed)
   04/10/2023  Joshua Compton. 13-Jun-1941 [id number]   Patient not currently participating in chronic care management services (CCM), status changed to previously enrolled.  Irving Shows [facility], BSN RN Case Manager [phone]

## 2023-04-15 DIAGNOSIS — Z803 Family history of malignant neoplasm of breast: Secondary | ICD-10-CM | POA: Diagnosis not present

## 2023-04-15 DIAGNOSIS — N529 Male erectile dysfunction, unspecified: Secondary | ICD-10-CM | POA: Diagnosis not present

## 2023-04-15 DIAGNOSIS — E785 Hyperlipidemia, unspecified: Secondary | ICD-10-CM | POA: Diagnosis not present

## 2023-04-15 DIAGNOSIS — Z791 Long term (current) use of non-steroidal anti-inflammatories (NSAID): Secondary | ICD-10-CM | POA: Diagnosis not present

## 2023-04-15 DIAGNOSIS — I129 Hypertensive chronic kidney disease with stage 1 through stage 4 chronic kidney disease, or unspecified chronic kidney disease: Secondary | ICD-10-CM | POA: Diagnosis not present

## 2023-04-15 DIAGNOSIS — M1990 Unspecified osteoarthritis, unspecified site: Secondary | ICD-10-CM | POA: Diagnosis not present

## 2023-04-15 DIAGNOSIS — G2581 Restless legs syndrome: Secondary | ICD-10-CM | POA: Diagnosis not present

## 2023-04-15 DIAGNOSIS — Z87891 Personal history of nicotine dependence: Secondary | ICD-10-CM | POA: Diagnosis not present

## 2023-04-15 DIAGNOSIS — K219 Gastro-esophageal reflux disease without esophagitis: Secondary | ICD-10-CM | POA: Diagnosis not present

## 2023-04-15 DIAGNOSIS — Z8249 Family history of ischemic heart disease and other diseases of the circulatory system: Secondary | ICD-10-CM | POA: Diagnosis not present

## 2023-04-15 DIAGNOSIS — N182 Chronic kidney disease, stage 2 (mild): Secondary | ICD-10-CM | POA: Diagnosis not present

## 2023-04-15 DIAGNOSIS — Z96649 Presence of unspecified artificial hip joint: Secondary | ICD-10-CM | POA: Diagnosis not present

## 2023-04-16 ENCOUNTER — Encounter: Payer: Self-pay | Admitting: Family Medicine

## 2023-04-16 ENCOUNTER — Ambulatory Visit (INDEPENDENT_AMBULATORY_CARE_PROVIDER_SITE_OTHER): Payer: Medicare HMO | Admitting: Family Medicine

## 2023-04-16 VITALS — BP 128/56 | HR 74 | Temp 97.6°F | Ht 72.0 in | Wt 196.2 lb

## 2023-04-16 DIAGNOSIS — G4701 Insomnia due to medical condition: Secondary | ICD-10-CM | POA: Diagnosis not present

## 2023-04-16 DIAGNOSIS — J189 Pneumonia, unspecified organism: Secondary | ICD-10-CM

## 2023-04-16 MED ORDER — PREDNISONE 10 MG PO TABS
ORAL_TABLET | ORAL | 0 refills | Status: DC
Start: 2023-04-16 — End: 2023-04-30

## 2023-04-16 MED ORDER — ESZOPICLONE 2 MG PO TABS
2.0000 mg | ORAL_TABLET | Freq: Every evening | ORAL | 0 refills | Status: DC | PRN
Start: 2023-04-16 — End: 2023-07-28

## 2023-04-16 MED ORDER — MOXIFLOXACIN HCL 400 MG PO TABS
400.0000 mg | ORAL_TABLET | Freq: Every day | ORAL | 0 refills | Status: DC
Start: 2023-04-16 — End: 2023-04-30

## 2023-04-16 NOTE — Progress Notes (Signed)
Subjective:  Patient ID: Joshua Najjar., male    DOB: Dec 31, 1940  Age: 82 y.o. MRN: [id number]  CC: No chief complaint on file.   HPI Joshua Compton. presents for pneumonia diagnosed on 5/20. Went to [facility] there (reviewed by me) showed RLL infiltrate. Just finished a course of augmentin. Denies fever. Some dyspnea last week is getting better. Still coughing. Can't sleep. Cough feels loose. Nothing coming up.     04/16/2023    2:53 PM 03/31/2023    3:44 PM 03/23/2023   12:33 PM  Depression screen PHQ 2/9  Decreased Interest 0 0 0  Down, Depressed, Hopeless 0 0 0  PHQ - 2 Score 0 0 0  Altered sleeping  1 0  Tired, decreased energy  1 1  Change in appetite  0 0  Feeling bad or failure about yourself   0 0  Trouble concentrating  0 0  Moving slowly or fidgety/restless  1 0  Suicidal thoughts  0 0  PHQ-9 Score  3 1  Difficult doing work/chores  Not difficult at all Not difficult at all    History Joshua Compton has a past medical history of Arthritis, Cancer (HCC), GERD (gastroesophageal reflux disease), Glaucoma, Hyperlipidemia, and Hypertension.   He has a past surgical history that includes Back surgery; Neck surgery; Transurethral resection of prostate; Hernia repair (11-06-2009); Rotator cuff repair (Left, jan 2016); Colonoscopy; Total hip arthroplasty (Right, 04/20/2020); and Total hip arthroplasty (Left, 01/07/2021).   His family history includes Cancer in his brother, brother, and mother; Heart disease in his father.He reports that he quit smoking about 18 years ago. His smoking use included cigarettes. He has a 15.00 pack-year smoking history. He has never used smokeless tobacco. He reports that he does not drink alcohol and does not use drugs.    ROS Review of Systems  Constitutional:  Negative for fever.  Respiratory:  Positive for cough and shortness of breath.   Cardiovascular:  Negative for chest pain.  Musculoskeletal:  Negative for arthralgias.  Skin:   Negative for rash.  Psychiatric/Behavioral:  Positive for sleep disturbance.     Objective:  BP (!) 128/56   Pulse 74   Temp 97.6 F (36.4 C)   Ht 6' (1.829 m)   Wt 196 lb 3.2 oz (89 kg)   SpO2 94%   BMI 26.61 kg/m   BP Readings from Last 3 Encounters:  04/16/23 (!) 128/56  04/06/23 (!) 148/65  03/31/23 (!) 172/74    Wt Readings from Last 3 Encounters:  04/16/23 196 lb 3.2 oz (89 kg)  03/31/23 195 lb (88.5 kg)  03/23/23 197 lb 3.2 oz (89.4 kg)     Physical Exam Vitals reviewed.  Constitutional:      Appearance: He is well-developed.  HENT:     Head: Normocephalic and atraumatic.     Right Ear: External ear normal.     Left Ear: External ear normal.     Mouth/Throat:     Pharynx: No oropharyngeal exudate or posterior oropharyngeal erythema.  Eyes:     Pupils: Pupils are equal, round, and reactive to light.  Cardiovascular:     Rate and Rhythm: Normal rate and regular rhythm.     Heart sounds: No murmur heard. Pulmonary:     Effort: No respiratory distress.     Breath sounds: Rales (few at the right base) present.  Musculoskeletal:     Cervical back: Normal range of motion and neck supple.  Neurological:  Mental Status: He is alert and oriented to person, place, and time.       Assessment & Plan:   Diagnoses and all orders for this visit:  Community acquired pneumonia of right lower lobe of lung -     moxifloxacin (AVELOX) 400 MG tablet; Take 1 tablet (400 mg total) by mouth daily. -     predniSONE (DELTASONE) 10 MG tablet; Take 5 daily for 3 days followed by 4,3,2 and 1 for 3 days each.  Insomnia due to medical condition -     eszopiclone (LUNESTA) 2 MG TABS tablet; Take 1 tablet (2 mg total) by mouth at bedtime as needed for up to 15 days for sleep.       I am having Joshua Najjar. start on moxifloxacin, predniSONE, and eszopiclone. I am also having him maintain his dorzolamide-timolol, aspirin EC, sildenafil,  Enalapril-hydroCHLOROthiazide, felodipine, pantoprazole, rosuvastatin, rOPINIRole, fluticasone, azithromycin, HYDROcodone bit-homatropine, and albuterol.  Allergies as of 04/16/2023       Reactions   Pravastatin Sodium    Muscle aches        Medication List        Accurate as of Apr 16, 2023  3:31 PM. If you have any questions, ask your nurse or doctor.          albuterol 108 (90 Base) MCG/ACT inhaler Commonly known as: VENTOLIN HFA Inhale 1-2 puffs into the lungs every 6 (six) hours as needed for wheezing or shortness of breath.   aspirin EC 81 MG tablet Take 1 tablet (81 mg total) by mouth 2 (two) times daily. To be taken after surgery   azithromycin 250 MG tablet Commonly known as: Zithromax Z-Pak As directed   dorzolamide-timolol 2-0.5 % ophthalmic solution Commonly known as: COSOPT Place 1 drop into both eyes daily.   Enalapril-hydroCHLOROthiazide 5-12.5 MG tablet Take 1 tablet by mouth daily. Take 1 tablet by mouth daily.   eszopiclone 2 MG Tabs tablet Commonly known as: Lunesta Take 1 tablet (2 mg total) by mouth at bedtime as needed for up to 15 days for sleep. Started by: [name], MD   felodipine 5 MG 24 hr tablet Commonly known as: PLENDIL TAKE 1 TABLET BY MOUTH EVERY DAY FOR BLOOD PRESSURE   fluticasone 50 MCG/ACT nasal spray Commonly known as: FLONASE Place 2 sprays into both nostrils daily.   HYDROcodone bit-homatropine 5-1.5 MG/5ML syrup Commonly known as: HYCODAN Take 5 mLs by mouth every 6 (six) hours as needed for cough.   moxifloxacin 400 MG tablet Commonly known as: AVELOX Take 1 tablet (400 mg total) by mouth daily. Started by: [name], MD   pantoprazole 40 MG tablet Commonly known as: PROTONIX Take 1 tablet (40 mg total) by mouth daily. For heartburn   predniSONE 10 MG tablet Commonly known as: DELTASONE Take 5 daily for 3 days followed by 4,3,2 and 1 for 3 days each. Started by: [name], MD   rOPINIRole 1 MG  tablet Commonly known as: REQUIP Take 1 tablet (1 mg total) by mouth 3 (three) times daily. Plus an extra tablet at bedtime to total four a day   rosuvastatin 5 MG tablet Commonly known as: CRESTOR Take 1 tablet (5 mg total) by mouth daily.   sildenafil 20 MG tablet Commonly known as: REVATIO Take 2-5 pills at once, orally, with each sexual encounter         Follow-up: Return in about 2 weeks (around 04/30/2023).  [name], M.D.

## 2023-04-30 ENCOUNTER — Ambulatory Visit (INDEPENDENT_AMBULATORY_CARE_PROVIDER_SITE_OTHER): Payer: Medicare HMO | Admitting: Family Medicine

## 2023-04-30 ENCOUNTER — Encounter: Payer: Self-pay | Admitting: Family Medicine

## 2023-04-30 ENCOUNTER — Ambulatory Visit (INDEPENDENT_AMBULATORY_CARE_PROVIDER_SITE_OTHER): Payer: Medicare HMO

## 2023-04-30 VITALS — BP 140/67 | HR 61 | Temp 97.8°F | Ht 72.0 in | Wt 194.0 lb

## 2023-04-30 DIAGNOSIS — J439 Emphysema, unspecified: Secondary | ICD-10-CM | POA: Diagnosis not present

## 2023-04-30 DIAGNOSIS — J189 Pneumonia, unspecified organism: Secondary | ICD-10-CM

## 2023-04-30 DIAGNOSIS — R0600 Dyspnea, unspecified: Secondary | ICD-10-CM | POA: Diagnosis not present

## 2023-04-30 DIAGNOSIS — R059 Cough, unspecified: Secondary | ICD-10-CM | POA: Diagnosis not present

## 2023-04-30 MED ORDER — BENZONATATE 200 MG PO CAPS: 200.0000 mg | ORAL_CAPSULE | Freq: Three times a day (TID) | ORAL | 0 refills | Status: DC | PRN

## 2023-04-30 NOTE — Progress Notes (Signed)
Subjective:  Patient ID: Joshua Compton., male    DOB: Oct 15, 1941  Age: 82 y.o. MRN: [id number]  CC: Follow-up and Pneumonia   HPI Joshua Compton. presents for some persistent cough. Occasionally brings up some yellow phlegm. Not dyspneic. No fever. Feeling more energetic.     04/30/2023    2:17 PM 04/16/2023    2:53 PM 03/31/2023    3:44 PM  Depression screen PHQ 2/9  Decreased Interest 0 0 0  Down, Depressed, Hopeless 0 0 0  PHQ - 2 Score 0 0 0  Altered sleeping   1  Tired, decreased energy   1  Change in appetite   0  Feeling bad or failure about yourself    0  Trouble concentrating   0  Moving slowly or fidgety/restless   1  Suicidal thoughts   0  PHQ-9 Score   3  Difficult doing work/chores   Not difficult at all    History Joshua Compton has a past medical history of Arthritis, Cancer (HCC), GERD (gastroesophageal reflux disease), Glaucoma, Hyperlipidemia, and Hypertension.   He has a past surgical history that includes Back surgery; Neck surgery; Transurethral resection of prostate; Hernia repair (11-06-2009); Rotator cuff repair (Left, jan 2016); Colonoscopy; Total hip arthroplasty (Right, 04/20/2020); and Total hip arthroplasty (Left, 01/07/2021).   His family history includes Cancer in his brother, brother, and mother; Heart disease in his father.He reports that he quit smoking about 18 years ago. His smoking use included cigarettes. He has a 15.00 pack-year smoking history. He has never used smokeless tobacco. He reports that he does not drink alcohol and does not use drugs.    ROS Review of Systems  Constitutional:  Negative for fever.  Respiratory:  Positive for cough. Negative for shortness of breath.   Cardiovascular:  Negative for chest pain.  Musculoskeletal:  Negative for arthralgias.  Skin:  Negative for rash.    Objective:  BP (!) 140/67   Pulse 61   Temp 97.8 F (36.6 C)   Ht 6' (1.829 m)   Wt 194 lb (88 kg)   SpO2 94%   BMI 26.31 kg/m    BP Readings from Last 3 Encounters:  04/30/23 (!) 140/67  04/16/23 (!) 128/56  04/06/23 (!) 148/65    Wt Readings from Last 3 Encounters:  04/30/23 194 lb (88 kg)  04/16/23 196 lb 3.2 oz (89 kg)  03/31/23 195 lb (88.5 kg)     Physical Exam Vitals reviewed.  Constitutional:      Appearance: He is well-developed.  HENT:     Head: Normocephalic and atraumatic.     Right Ear: External ear normal.     Left Ear: External ear normal.     Mouth/Throat:     Pharynx: No oropharyngeal exudate or posterior oropharyngeal erythema.  Eyes:     Pupils: Pupils are equal, round, and reactive to light.  Cardiovascular:     Rate and Rhythm: Normal rate and regular rhythm.     Heart sounds: No murmur heard. Pulmonary:     Effort: No respiratory distress.     Breath sounds: Normal breath sounds.  Musculoskeletal:     Cervical back: Normal range of motion and neck supple.  Neurological:     Mental Status: He is alert and oriented to person, place, and time.       Assessment & Plan:   Joshua Compton was seen today for follow-up and pneumonia.  Diagnoses and all orders for this visit:  Community  acquired pneumonia of right lower lobe of lung -     DG Chest 2 View; Future  Other orders -     benzonatate (TESSALON) 200 MG capsule; Take 1 capsule (200 mg total) by mouth 3 (three) times daily as needed for cough.       I have discontinued Joshua Lewandowsky Jr.'s azithromycin, HYDROcodone bit-homatropine, moxifloxacin, and predniSONE. I am also having him start on benzonatate. Additionally, I am having him maintain his dorzolamide-timolol, aspirin EC, sildenafil, Enalapril-hydroCHLOROthiazide, felodipine, pantoprazole, rosuvastatin, rOPINIRole, fluticasone, albuterol, and eszopiclone.  Allergies as of 04/30/2023       Reactions   Pravastatin Sodium    Muscle aches        Medication List        Accurate as of April 30, 2023 11:59 PM. If you have any questions, ask your nurse or  doctor.          STOP taking these medications    azithromycin 250 MG tablet Commonly known as: Zithromax Z-Pak Stopped by: [name], MD   HYDROcodone bit-homatropine 5-1.5 MG/5ML syrup Commonly known as: HYCODAN Stopped by: [name], MD   moxifloxacin 400 MG tablet Commonly known as: AVELOX Stopped by: [name], MD   predniSONE 10 MG tablet Commonly known as: DELTASONE Stopped by: [name], MD       TAKE these medications    albuterol 108 (90 Base) MCG/ACT inhaler Commonly known as: VENTOLIN HFA Inhale 1-2 puffs into the lungs every 6 (six) hours as needed for wheezing or shortness of breath.   aspirin EC 81 MG tablet Take 1 tablet (81 mg total) by mouth 2 (two) times daily. To be taken after surgery   benzonatate 200 MG capsule Commonly known as: TESSALON Take 1 capsule (200 mg total) by mouth 3 (three) times daily as needed for cough. Started by: [name], MD   dorzolamide-timolol 2-0.5 % ophthalmic solution Commonly known as: COSOPT Place 1 drop into both eyes daily.   Enalapril-hydroCHLOROthiazide 5-12.5 MG tablet Take 1 tablet by mouth daily. Take 1 tablet by mouth daily.   eszopiclone 2 MG Tabs tablet Commonly known as: Lunesta Take 1 tablet (2 mg total) by mouth at bedtime as needed for up to 15 days for sleep.   felodipine 5 MG 24 hr tablet Commonly known as: PLENDIL TAKE 1 TABLET BY MOUTH EVERY DAY FOR BLOOD PRESSURE   fluticasone 50 MCG/ACT nasal spray Commonly known as: FLONASE Place 2 sprays into both nostrils daily.   pantoprazole 40 MG tablet Commonly known as: PROTONIX Take 1 tablet (40 mg total) by mouth daily. For heartburn   rOPINIRole 1 MG tablet Commonly known as: REQUIP Take 1 tablet (1 mg total) by mouth 3 (three) times daily. Plus an extra tablet at bedtime to total four a day   rosuvastatin 5 MG tablet Commonly known as: CRESTOR Take 1 tablet (5 mg total) by mouth daily.   sildenafil 20 MG  tablet Commonly known as: REVATIO Take 2-5 pills at once, orally, with each sexual encounter         Follow-up: Return if symptoms worsen or fail to improve.  [name], M.D.

## 2023-05-04 ENCOUNTER — Encounter: Payer: Self-pay | Admitting: Family Medicine

## 2023-06-13 ENCOUNTER — Other Ambulatory Visit: Payer: Self-pay | Admitting: Family Medicine

## 2023-06-13 DIAGNOSIS — K219 Gastro-esophageal reflux disease without esophagitis: Secondary | ICD-10-CM

## 2023-06-13 DIAGNOSIS — I10 Essential (primary) hypertension: Secondary | ICD-10-CM

## 2023-06-14 ENCOUNTER — Other Ambulatory Visit: Payer: Self-pay | Admitting: Family Medicine

## 2023-06-14 DIAGNOSIS — I10 Essential (primary) hypertension: Secondary | ICD-10-CM

## 2023-06-16 ENCOUNTER — Ambulatory Visit: Payer: Medicare HMO | Admitting: Family Medicine

## 2023-06-18 ENCOUNTER — Encounter: Payer: Self-pay | Admitting: Family Medicine

## 2023-07-19 DIAGNOSIS — I639 Cerebral infarction, unspecified: Secondary | ICD-10-CM

## 2023-07-19 HISTORY — DX: Cerebral infarction, unspecified: I63.9

## 2023-07-28 ENCOUNTER — Encounter: Payer: Self-pay | Admitting: Family Medicine

## 2023-07-28 ENCOUNTER — Ambulatory Visit (INDEPENDENT_AMBULATORY_CARE_PROVIDER_SITE_OTHER): Payer: Medicare HMO | Admitting: Family Medicine

## 2023-07-28 VITALS — BP 147/60 | HR 61 | Temp 97.6°F | Ht 72.0 in | Wt 199.0 lb

## 2023-07-28 DIAGNOSIS — F5101 Primary insomnia: Secondary | ICD-10-CM

## 2023-07-28 DIAGNOSIS — R7303 Prediabetes: Secondary | ICD-10-CM | POA: Diagnosis not present

## 2023-07-28 DIAGNOSIS — I10 Essential (primary) hypertension: Secondary | ICD-10-CM

## 2023-07-28 DIAGNOSIS — E782 Mixed hyperlipidemia: Secondary | ICD-10-CM

## 2023-07-28 DIAGNOSIS — R202 Paresthesia of skin: Secondary | ICD-10-CM | POA: Diagnosis not present

## 2023-07-28 DIAGNOSIS — K219 Gastro-esophageal reflux disease without esophagitis: Secondary | ICD-10-CM | POA: Diagnosis not present

## 2023-07-28 DIAGNOSIS — Z125 Encounter for screening for malignant neoplasm of prostate: Secondary | ICD-10-CM | POA: Diagnosis not present

## 2023-07-28 DIAGNOSIS — I739 Peripheral vascular disease, unspecified: Secondary | ICD-10-CM | POA: Diagnosis not present

## 2023-07-28 DIAGNOSIS — Z23 Encounter for immunization: Secondary | ICD-10-CM

## 2023-07-28 LAB — BAYER DCA HB A1C WAIVED: HB A1C (BAYER DCA - WAIVED): 5.6 % (ref 4.8–5.6)

## 2023-07-28 MED ORDER — PANTOPRAZOLE SODIUM 40 MG PO TBEC: 40.0000 mg | DELAYED_RELEASE_TABLET | Freq: Every day | ORAL | 3 refills | Status: DC

## 2023-07-28 MED ORDER — ENALAPRIL-HYDROCHLOROTHIAZIDE 5-12.5 MG PO TABS: 1.0000 | ORAL_TABLET | Freq: Every day | ORAL | 3 refills | Status: DC

## 2023-07-28 MED ORDER — FELODIPINE ER 5 MG PO TB24: ORAL_TABLET | ORAL | 3 refills | Status: DC

## 2023-07-28 MED ORDER — GABAPENTIN 300 MG PO CAPS
ORAL_CAPSULE | ORAL | 0 refills | Status: DC
Start: 2023-07-28 — End: 2023-09-05

## 2023-07-28 MED ORDER — METOPROLOL SUCCINATE ER 50 MG PO TB24
50.0000 mg | ORAL_TABLET | Freq: Every day | ORAL | 2 refills | Status: DC
Start: 2023-07-28 — End: 2023-08-24

## 2023-07-28 MED ORDER — ROSUVASTATIN CALCIUM 5 MG PO TABS: 5.0000 mg | ORAL_TABLET | Freq: Every day | ORAL | 3 refills | Status: DC

## 2023-07-28 NOTE — Progress Notes (Signed)
Subjective:  Patient ID: Joshua Compton.,  male    DOB: 1941/02/08  Age: 82 y.o.    CC: Medical Management of Chronic Issues   HPI Joshua Compton. presents for  follow-up of hypertension. Patient has no history of headache chest pain or shortness of breath or recent cough. Patient also denies symptoms of TIA such as numbness weakness lateralizing. Patient denies side effects from medication. States taking it regularly.  Patient also  in for follow-up of elevated cholesterol. Doing well without complaints on current medication. Denies side effects  including myalgia and arthralgia and nausea. Also in today for liver function testing. Currently no chest pain, shortness of breath or other cardiovascular related symptoms noted.  Follow-up of prediabetes. Patient denies symptoms such as excessive hunger or urinary frequency, excessive hunger, nausea No significant hypoglycemic spells noted. Medications reviewed. Pt reports taking them regularly. Pt. denies complication/adverse reaction today.   Patient in for follow-up of GERD. Currently asymptomatic taking  PPI daily. There is no chest pain or heartburn. No hematemesis and no melena. No dysphagia or choking. Onset is remote. Progression is stable. Complicating factors, none.    History Joshua Compton has a past medical history of Arthritis, Cancer (HCC), GERD (gastroesophageal reflux disease), Glaucoma, Hyperlipidemia, and Hypertension.   He has a past surgical history that includes Back surgery; Neck surgery; Transurethral resection of prostate; Hernia repair (11-06-2009); Rotator cuff repair (Left, jan 2016); Colonoscopy; Total hip arthroplasty (Right, 04/20/2020); and Total hip arthroplasty (Left, 01/07/2021).   His family history includes Cancer in his brother, brother, and mother; Heart disease in his father.He reports that he quit smoking about 18 years ago. His smoking use included cigarettes. He started smoking about 48 years  ago. He has a 15 pack-year smoking history. He has never used smokeless tobacco. He reports that he does not drink alcohol and does not use drugs.  Current Outpatient Medications on File Prior to Visit  Medication Sig Dispense Refill   albuterol (VENTOLIN HFA) 108 (90 Base) MCG/ACT inhaler Inhale 1-2 puffs into the lungs every 6 (six) hours as needed for wheezing or shortness of breath. 18 g 0   dorzolamide-timolol (COSOPT) 22.3-6.8 MG/ML ophthalmic solution Place 1 drop into both eyes daily.      fluticasone (FLONASE) 50 MCG/ACT nasal spray Place 2 sprays into both nostrils daily. 16 g 6   rOPINIRole (REQUIP) 1 MG tablet Take 1 tablet (1 mg total) by mouth 3 (three) times daily. Plus an extra tablet at bedtime to total four a day 360 tablet 3   sildenafil (REVATIO) 20 MG tablet Take 2-5 pills at once, orally, with each sexual encounter 12 tablet 5   No current facility-administered medications on file prior to visit.    ROS Review of Systems  Constitutional:  Negative for fever.  Respiratory:  Negative for shortness of breath.   Cardiovascular:  Negative for chest pain.  Musculoskeletal:  Negative for arthralgias.       Feet tingle & feel cold at night. Burn sometimes.   Skin:  Negative for rash.  Neurological:  Negative for numbness.   Objective:  BP (!) 147/60   Pulse 61   Temp 97.6 F (36.4 C)   Ht 6' (1.829 m)   Wt 199 lb (90.3 kg)   SpO2 94%   BMI 26.99 kg/m   BP Readings from Last 3 Encounters:  07/28/23 (!) 147/60  04/30/23 (!) 140/67  04/16/23 (!) 128/56    Wt Readings from Last 3 Encounters:  07/28/23 199 lb (90.3 kg)  04/30/23 194 lb (88 kg)  04/16/23 196 lb 3.2 oz (89 kg)     Physical Exam Vitals reviewed.  Constitutional:      Appearance: He is well-developed.  HENT:     Head: Normocephalic and atraumatic.     Right Ear: External ear normal.     Left Ear: External ear normal.     Mouth/Throat:     Pharynx: No oropharyngeal exudate or posterior  oropharyngeal erythema.  Eyes:     Pupils: Pupils are equal, round, and reactive to light.  Cardiovascular:     Rate and Rhythm: Normal rate and regular rhythm.     Heart sounds: No murmur heard. Pulmonary:     Effort: No respiratory distress.     Breath sounds: Normal breath sounds.  Musculoskeletal:     Cervical back: Normal range of motion and neck supple.  Neurological:     Mental Status: He is alert and oriented to person, place, and time.     Sensory: No sensory deficit.     A1c today is 5.6   Lab Results  Component Value Date   HGBA1C 5.6 07/28/2023   HGBA1C 6.4 (H) 01/28/2023    Assessment & Plan:   Joshua Compton was seen today for medical management of chronic issues.  Diagnoses and all orders for this visit:  Essential hypertension -     CBC with Differential/Platelet -     CMP14+EGFR -     Enalapril-hydroCHLOROthiazide 5-12.5 MG tablet; Take 1 tablet by mouth daily. -     Discontinue: felodipine (PLENDIL) 5 MG 24 hr tablet; TAKE 1 TABLET BY MOUTH EVERY DAY FOR BLOOD PRESSURE  Mixed hyperlipidemia -     Lipid panel -     rosuvastatin (CRESTOR) 5 MG tablet; Take 1 tablet (5 mg total) by mouth daily.  Prediabetes -     Bayer DCA Hb A1c Waived  Gastroesophageal reflux disease without esophagitis -     pantoprazole (PROTONIX) 40 MG tablet; Take 1 tablet (40 mg total) by mouth daily. For heartburn  Primary insomnia  Intermittent claudication (HCC) -     Ambulatory referral to Vascular Surgery  Paresthesia of both feet -     gabapentin (NEURONTIN) 300 MG capsule; 1 at bedtime for1 week then 2  The next  week then 3 the next week then 4 daily.  Other orders -     metoprolol succinate (TOPROL-XL) 50 MG 24 hr tablet; Take 1 tablet (50 mg total) by mouth daily. For blood pressure control   I have discontinued Joshua Lewandowsky Jr.'s eszopiclone, felodipine, and felodipine. I am also having him start on metoprolol succinate and gabapentin. Additionally, I am  having him maintain his dorzolamide-timolol, sildenafil, rOPINIRole, fluticasone, albuterol, Enalapril-hydroCHLOROthiazide, pantoprazole, and rosuvastatin.  Meds ordered this encounter  Medications   Enalapril-hydroCHLOROthiazide 5-12.5 MG tablet    Sig: Take 1 tablet by mouth daily.    Dispense:  90 tablet    Refill:  3   DISCONTD: felodipine (PLENDIL) 5 MG 24 hr tablet    Sig: TAKE 1 TABLET BY MOUTH EVERY DAY FOR BLOOD PRESSURE    Dispense:  90 tablet    Refill:  3   pantoprazole (PROTONIX) 40 MG tablet    Sig: Take 1 tablet (40 mg total) by mouth daily. For heartburn    Dispense:  90 tablet    Refill:  3   rosuvastatin (CRESTOR) 5 MG tablet    Sig: Take 1 tablet (  5 mg total) by mouth daily.    Dispense:  90 tablet    Refill:  3   metoprolol succinate (TOPROL-XL) 50 MG 24 hr tablet    Sig: Take 1 tablet (50 mg total) by mouth daily. For blood pressure control    Dispense:  30 tablet    Refill:  2   gabapentin (NEURONTIN) 300 MG capsule    Sig: 1 at bedtime for1 week then 2  The next  week then 3 the next week then 4 daily.    Dispense:  120 capsule    Refill:  0     Follow-up: Return in about 6 weeks (around 09/08/2023). To monitor progress with the gabapentin  [name], M.D.

## 2023-07-29 LAB — CMP14+EGFR
ALT: 21 IU/L (ref 0–44)
AST: 23 IU/L (ref 0–40)
Albumin: 4.3 g/dL (ref 3.7–4.7)
Alkaline Phosphatase: 92 IU/L (ref 44–121)
BUN/Creatinine Ratio: 14 (ref 10–24)
BUN: 18 mg/dL (ref 8–27)
Bilirubin Total: 0.6 mg/dL (ref 0.0–1.2)
CO2: 25 mmol/L (ref 20–29)
Calcium: 9.9 mg/dL (ref 8.6–10.2)
Chloride: 105 mmol/L (ref 96–106)
Creatinine, Ser: 1.27 mg/dL (ref 0.76–1.27)
Globulin, Total: 2.8 g/dL (ref 1.5–4.5)
Glucose: 74 mg/dL (ref 70–99)
Potassium: 4 mmol/L (ref 3.5–5.2)
Sodium: 144 mmol/L (ref 134–144)
Total Protein: 7.1 g/dL (ref 6.0–8.5)
eGFR: 56 mL/min/{1.73_m2} — ABNORMAL LOW (ref >59)

## 2023-07-29 LAB — CBC WITH DIFFERENTIAL/PLATELET
Basophils Absolute: 0.1 10*3/uL (ref 0.0–0.2)
Basos: 2 %
EOS (ABSOLUTE): 0.2 10*3/uL (ref 0.0–0.4)
Eos: 3 %
Hematocrit: 45.1 % (ref 37.5–51.0)
Hemoglobin: 14.8 g/dL (ref 13.0–17.7)
Immature Grans (Abs): 0 10*3/uL (ref 0.0–0.1)
Immature Granulocytes: 0 %
Lymphocytes Absolute: 1.7 10*3/uL (ref 0.7–3.1)
Lymphs: 34 %
MCH: 30.9 pg (ref 26.6–33.0)
MCHC: 32.8 g/dL (ref 31.5–35.7)
MCV: 94 fL (ref 79–97)
Monocytes Absolute: 0.6 10*3/uL (ref 0.1–0.9)
Monocytes: 12 %
Neutrophils Absolute: 2.4 10*3/uL (ref 1.4–7.0)
Neutrophils: 49 %
Platelets: 196 10*3/uL (ref 150–450)
RBC: 4.79 x10E6/uL (ref 4.14–5.80)
RDW: 12.8 % (ref 11.6–15.4)
WBC: 4.9 10*3/uL (ref 3.4–10.8)

## 2023-07-29 LAB — LIPID PANEL
Chol/HDL Ratio: 2.5 ratio (ref 0.0–5.0)
Cholesterol, Total: 157 mg/dL (ref 100–199)
HDL: 64 mg/dL (ref >39)
LDL Chol Calc (NIH): 77 mg/dL (ref 0–99)
Triglycerides: 87 mg/dL (ref 0–149)
VLDL Cholesterol Cal: 16 mg/dL (ref 5–40)

## 2023-07-29 NOTE — Progress Notes (Signed)
Hello Joshua Compton,  Your lab result is normal and/or stable.Some minor variations that are not significant are commonly marked abnormal, but do not represent any medical problem for you.  Best regards, Warren Stacks, M.D.

## 2023-07-30 LAB — PSA: Prostate Specific Ag, Serum: <0.1 ng/mL (ref 0.0–4.0)

## 2023-07-30 LAB — SPECIMEN STATUS REPORT

## 2023-08-06 DIAGNOSIS — I69851 Hemiplegia and hemiparesis following other cerebrovascular disease affecting right dominant side: Secondary | ICD-10-CM | POA: Diagnosis not present

## 2023-08-06 DIAGNOSIS — E161 Other hypoglycemia: Secondary | ICD-10-CM | POA: Diagnosis not present

## 2023-08-06 DIAGNOSIS — C61 Malignant neoplasm of prostate: Secondary | ICD-10-CM | POA: Diagnosis not present

## 2023-08-06 DIAGNOSIS — Z743 Need for continuous supervision: Secondary | ICD-10-CM | POA: Diagnosis not present

## 2023-08-06 DIAGNOSIS — E785 Hyperlipidemia, unspecified: Secondary | ICD-10-CM | POA: Diagnosis not present

## 2023-08-06 DIAGNOSIS — R531 Weakness: Secondary | ICD-10-CM | POA: Diagnosis not present

## 2023-08-06 DIAGNOSIS — I10 Essential (primary) hypertension: Secondary | ICD-10-CM | POA: Diagnosis not present

## 2023-08-06 DIAGNOSIS — I517 Cardiomegaly: Secondary | ICD-10-CM | POA: Diagnosis not present

## 2023-08-06 DIAGNOSIS — I679 Cerebrovascular disease, unspecified: Secondary | ICD-10-CM | POA: Diagnosis not present

## 2023-08-06 DIAGNOSIS — R29810 Facial weakness: Secondary | ICD-10-CM | POA: Diagnosis not present

## 2023-08-06 DIAGNOSIS — R918 Other nonspecific abnormal finding of lung field: Secondary | ICD-10-CM | POA: Diagnosis not present

## 2023-08-06 DIAGNOSIS — T656X1A Toxic effect of paints and dyes, not elsewhere classified, accidental (unintentional), initial encounter: Secondary | ICD-10-CM | POA: Diagnosis not present

## 2023-08-06 DIAGNOSIS — R059 Cough, unspecified: Secondary | ICD-10-CM | POA: Diagnosis not present

## 2023-08-06 DIAGNOSIS — I639 Cerebral infarction, unspecified: Secondary | ICD-10-CM | POA: Diagnosis not present

## 2023-08-06 DIAGNOSIS — R471 Dysarthria and anarthria: Secondary | ICD-10-CM | POA: Diagnosis not present

## 2023-08-06 DIAGNOSIS — R001 Bradycardia, unspecified: Secondary | ICD-10-CM | POA: Diagnosis not present

## 2023-08-06 DIAGNOSIS — T782XXA Anaphylactic shock, unspecified, initial encounter: Secondary | ICD-10-CM | POA: Diagnosis not present

## 2023-08-06 DIAGNOSIS — Z4682 Encounter for fitting and adjustment of non-vascular catheter: Secondary | ICD-10-CM | POA: Diagnosis not present

## 2023-08-06 DIAGNOSIS — R0989 Other specified symptoms and signs involving the circulatory and respiratory systems: Secondary | ICD-10-CM | POA: Diagnosis not present

## 2023-08-07 DIAGNOSIS — T886XXA Anaphylactic reaction due to adverse effect of correct drug or medicament properly administered, initial encounter: Secondary | ICD-10-CM | POA: Diagnosis not present

## 2023-08-07 DIAGNOSIS — I952 Hypotension due to drugs: Secondary | ICD-10-CM | POA: Diagnosis not present

## 2023-08-07 DIAGNOSIS — Z7409 Other reduced mobility: Secondary | ICD-10-CM | POA: Diagnosis not present

## 2023-08-07 DIAGNOSIS — R29716 NIHSS score 16: Secondary | ICD-10-CM | POA: Diagnosis not present

## 2023-08-07 DIAGNOSIS — I69322 Dysarthria following cerebral infarction: Secondary | ICD-10-CM | POA: Diagnosis not present

## 2023-08-07 DIAGNOSIS — R918 Other nonspecific abnormal finding of lung field: Secondary | ICD-10-CM | POA: Diagnosis not present

## 2023-08-07 DIAGNOSIS — Z9282 Status post administration of tPA (rtPA) in a different facility within the last 24 hours prior to admission to current facility: Secondary | ICD-10-CM | POA: Diagnosis not present

## 2023-08-07 DIAGNOSIS — R4701 Aphasia: Secondary | ICD-10-CM | POA: Diagnosis not present

## 2023-08-07 DIAGNOSIS — G319 Degenerative disease of nervous system, unspecified: Secondary | ICD-10-CM | POA: Diagnosis not present

## 2023-08-07 DIAGNOSIS — I69320 Aphasia following cerebral infarction: Secondary | ICD-10-CM | POA: Diagnosis not present

## 2023-08-07 DIAGNOSIS — H4010X0 Unspecified open-angle glaucoma, stage unspecified: Secondary | ICD-10-CM | POA: Diagnosis not present

## 2023-08-07 DIAGNOSIS — E860 Dehydration: Secondary | ICD-10-CM | POA: Diagnosis not present

## 2023-08-07 DIAGNOSIS — T508X5A Adverse effect of diagnostic agents, initial encounter: Secondary | ICD-10-CM | POA: Diagnosis not present

## 2023-08-07 DIAGNOSIS — E785 Hyperlipidemia, unspecified: Secondary | ICD-10-CM | POA: Diagnosis not present

## 2023-08-07 DIAGNOSIS — I10 Essential (primary) hypertension: Secondary | ICD-10-CM | POA: Diagnosis not present

## 2023-08-07 DIAGNOSIS — T656X1A Toxic effect of paints and dyes, not elsewhere classified, accidental (unintentional), initial encounter: Secondary | ICD-10-CM | POA: Diagnosis not present

## 2023-08-07 DIAGNOSIS — R531 Weakness: Secondary | ICD-10-CM | POA: Diagnosis not present

## 2023-08-07 DIAGNOSIS — J811 Chronic pulmonary edema: Secondary | ICD-10-CM | POA: Diagnosis not present

## 2023-08-07 DIAGNOSIS — I69318 Other symptoms and signs involving cognitive functions following cerebral infarction: Secondary | ICD-10-CM | POA: Diagnosis not present

## 2023-08-07 DIAGNOSIS — N390 Urinary tract infection, site not specified: Secondary | ICD-10-CM | POA: Diagnosis not present

## 2023-08-07 DIAGNOSIS — I618 Other nontraumatic intracerebral hemorrhage: Secondary | ICD-10-CM | POA: Diagnosis not present

## 2023-08-07 DIAGNOSIS — I82623 Acute embolism and thrombosis of deep veins of upper extremity, bilateral: Secondary | ICD-10-CM | POA: Diagnosis not present

## 2023-08-07 DIAGNOSIS — I959 Hypotension, unspecified: Secondary | ICD-10-CM | POA: Diagnosis not present

## 2023-08-07 DIAGNOSIS — I69392 Facial weakness following cerebral infarction: Secondary | ICD-10-CM | POA: Diagnosis not present

## 2023-08-07 DIAGNOSIS — I351 Nonrheumatic aortic (valve) insufficiency: Secondary | ICD-10-CM | POA: Diagnosis not present

## 2023-08-07 DIAGNOSIS — I69851 Hemiplegia and hemiparesis following other cerebrovascular disease affecting right dominant side: Secondary | ICD-10-CM | POA: Diagnosis not present

## 2023-08-07 DIAGNOSIS — I517 Cardiomegaly: Secondary | ICD-10-CM | POA: Diagnosis not present

## 2023-08-07 DIAGNOSIS — R471 Dysarthria and anarthria: Secondary | ICD-10-CM | POA: Diagnosis not present

## 2023-08-07 DIAGNOSIS — I69391 Dysphagia following cerebral infarction: Secondary | ICD-10-CM | POA: Diagnosis not present

## 2023-08-07 DIAGNOSIS — I619 Nontraumatic intracerebral hemorrhage, unspecified: Secondary | ICD-10-CM | POA: Diagnosis not present

## 2023-08-07 DIAGNOSIS — D72829 Elevated white blood cell count, unspecified: Secondary | ICD-10-CM | POA: Diagnosis not present

## 2023-08-07 DIAGNOSIS — T782XXA Anaphylactic shock, unspecified, initial encounter: Secondary | ICD-10-CM | POA: Diagnosis not present

## 2023-08-07 DIAGNOSIS — I69398 Other sequelae of cerebral infarction: Secondary | ICD-10-CM | POA: Diagnosis not present

## 2023-08-07 DIAGNOSIS — I6523 Occlusion and stenosis of bilateral carotid arteries: Secondary | ICD-10-CM | POA: Diagnosis not present

## 2023-08-07 DIAGNOSIS — Z4682 Encounter for fitting and adjustment of non-vascular catheter: Secondary | ICD-10-CM | POA: Diagnosis not present

## 2023-08-07 DIAGNOSIS — J439 Emphysema, unspecified: Secondary | ICD-10-CM | POA: Diagnosis not present

## 2023-08-07 DIAGNOSIS — Z8546 Personal history of malignant neoplasm of prostate: Secondary | ICD-10-CM | POA: Diagnosis not present

## 2023-08-07 DIAGNOSIS — E871 Hypo-osmolality and hyponatremia: Secondary | ICD-10-CM | POA: Diagnosis not present

## 2023-08-07 DIAGNOSIS — E7800 Pure hypercholesterolemia, unspecified: Secondary | ICD-10-CM | POA: Diagnosis not present

## 2023-08-07 DIAGNOSIS — H4010X3 Unspecified open-angle glaucoma, severe stage: Secondary | ICD-10-CM | POA: Diagnosis not present

## 2023-08-07 DIAGNOSIS — J90 Pleural effusion, not elsewhere classified: Secondary | ICD-10-CM | POA: Diagnosis not present

## 2023-08-07 DIAGNOSIS — K219 Gastro-esophageal reflux disease without esophagitis: Secondary | ICD-10-CM | POA: Diagnosis not present

## 2023-08-07 DIAGNOSIS — E559 Vitamin D deficiency, unspecified: Secondary | ICD-10-CM | POA: Diagnosis not present

## 2023-08-07 DIAGNOSIS — N3001 Acute cystitis with hematuria: Secondary | ICD-10-CM | POA: Diagnosis not present

## 2023-08-07 DIAGNOSIS — T508X1A Poisoning by diagnostic agents, accidental (unintentional), initial encounter: Secondary | ICD-10-CM | POA: Diagnosis not present

## 2023-08-07 DIAGNOSIS — I82613 Acute embolism and thrombosis of superficial veins of upper extremity, bilateral: Secondary | ICD-10-CM | POA: Diagnosis not present

## 2023-08-07 DIAGNOSIS — I63412 Cerebral infarction due to embolism of left middle cerebral artery: Secondary | ICD-10-CM | POA: Diagnosis not present

## 2023-08-07 DIAGNOSIS — B957 Other staphylococcus as the cause of diseases classified elsewhere: Secondary | ICD-10-CM | POA: Diagnosis not present

## 2023-08-07 DIAGNOSIS — I6349 Cerebral infarction due to embolism of other cerebral artery: Secondary | ICD-10-CM | POA: Diagnosis not present

## 2023-08-07 DIAGNOSIS — H409 Unspecified glaucoma: Secondary | ICD-10-CM | POA: Diagnosis not present

## 2023-08-07 DIAGNOSIS — R9082 White matter disease, unspecified: Secondary | ICD-10-CM | POA: Diagnosis not present

## 2023-08-07 DIAGNOSIS — R2689 Other abnormalities of gait and mobility: Secondary | ICD-10-CM | POA: Diagnosis not present

## 2023-08-07 DIAGNOSIS — J9601 Acute respiratory failure with hypoxia: Secondary | ICD-10-CM | POA: Diagnosis not present

## 2023-08-07 DIAGNOSIS — I69351 Hemiplegia and hemiparesis following cerebral infarction affecting right dominant side: Secondary | ICD-10-CM | POA: Diagnosis not present

## 2023-08-07 DIAGNOSIS — J9690 Respiratory failure, unspecified, unspecified whether with hypoxia or hypercapnia: Secondary | ICD-10-CM | POA: Diagnosis not present

## 2023-08-07 DIAGNOSIS — I639 Cerebral infarction, unspecified: Secondary | ICD-10-CM | POA: Diagnosis not present

## 2023-08-07 DIAGNOSIS — G8191 Hemiplegia, unspecified affecting right dominant side: Secondary | ICD-10-CM | POA: Diagnosis not present

## 2023-08-07 DIAGNOSIS — R1310 Dysphagia, unspecified: Secondary | ICD-10-CM | POA: Diagnosis not present

## 2023-08-07 DIAGNOSIS — N179 Acute kidney failure, unspecified: Secondary | ICD-10-CM | POA: Diagnosis not present

## 2023-08-07 DIAGNOSIS — E079 Disorder of thyroid, unspecified: Secondary | ICD-10-CM | POA: Diagnosis not present

## 2023-08-07 DIAGNOSIS — R29723 NIHSS score 23: Secondary | ICD-10-CM | POA: Diagnosis not present

## 2023-08-08 DIAGNOSIS — D72829 Elevated white blood cell count, unspecified: Secondary | ICD-10-CM | POA: Diagnosis not present

## 2023-08-08 DIAGNOSIS — R1310 Dysphagia, unspecified: Secondary | ICD-10-CM | POA: Diagnosis not present

## 2023-08-08 DIAGNOSIS — H4010X0 Unspecified open-angle glaucoma, stage unspecified: Secondary | ICD-10-CM | POA: Diagnosis not present

## 2023-08-08 DIAGNOSIS — J439 Emphysema, unspecified: Secondary | ICD-10-CM | POA: Diagnosis not present

## 2023-08-08 DIAGNOSIS — E785 Hyperlipidemia, unspecified: Secondary | ICD-10-CM | POA: Diagnosis not present

## 2023-08-08 DIAGNOSIS — I639 Cerebral infarction, unspecified: Secondary | ICD-10-CM | POA: Diagnosis not present

## 2023-08-08 DIAGNOSIS — I10 Essential (primary) hypertension: Secondary | ICD-10-CM | POA: Diagnosis not present

## 2023-08-08 DIAGNOSIS — Z7409 Other reduced mobility: Secondary | ICD-10-CM | POA: Diagnosis not present

## 2023-08-08 DIAGNOSIS — Z8546 Personal history of malignant neoplasm of prostate: Secondary | ICD-10-CM | POA: Diagnosis not present

## 2023-08-08 DIAGNOSIS — E079 Disorder of thyroid, unspecified: Secondary | ICD-10-CM | POA: Diagnosis not present

## 2023-08-08 DIAGNOSIS — K219 Gastro-esophageal reflux disease without esophagitis: Secondary | ICD-10-CM | POA: Diagnosis not present

## 2023-08-09 DIAGNOSIS — Z8546 Personal history of malignant neoplasm of prostate: Secondary | ICD-10-CM | POA: Diagnosis not present

## 2023-08-09 DIAGNOSIS — K219 Gastro-esophageal reflux disease without esophagitis: Secondary | ICD-10-CM | POA: Diagnosis not present

## 2023-08-09 DIAGNOSIS — Z7409 Other reduced mobility: Secondary | ICD-10-CM | POA: Diagnosis not present

## 2023-08-09 DIAGNOSIS — R1310 Dysphagia, unspecified: Secondary | ICD-10-CM | POA: Diagnosis not present

## 2023-08-09 DIAGNOSIS — D72829 Elevated white blood cell count, unspecified: Secondary | ICD-10-CM | POA: Diagnosis not present

## 2023-08-09 DIAGNOSIS — I639 Cerebral infarction, unspecified: Secondary | ICD-10-CM | POA: Diagnosis not present

## 2023-08-09 DIAGNOSIS — E785 Hyperlipidemia, unspecified: Secondary | ICD-10-CM | POA: Diagnosis not present

## 2023-08-09 DIAGNOSIS — J439 Emphysema, unspecified: Secondary | ICD-10-CM | POA: Diagnosis not present

## 2023-08-09 DIAGNOSIS — I10 Essential (primary) hypertension: Secondary | ICD-10-CM | POA: Diagnosis not present

## 2023-08-09 DIAGNOSIS — H4010X0 Unspecified open-angle glaucoma, stage unspecified: Secondary | ICD-10-CM | POA: Diagnosis not present

## 2023-08-09 DIAGNOSIS — E079 Disorder of thyroid, unspecified: Secondary | ICD-10-CM | POA: Diagnosis not present

## 2023-08-10 DIAGNOSIS — I63412 Cerebral infarction due to embolism of left middle cerebral artery: Secondary | ICD-10-CM | POA: Insufficient documentation

## 2023-08-14 DIAGNOSIS — I69318 Other symptoms and signs involving cognitive functions following cerebral infarction: Secondary | ICD-10-CM | POA: Diagnosis not present

## 2023-08-14 DIAGNOSIS — I10 Essential (primary) hypertension: Secondary | ICD-10-CM | POA: Diagnosis not present

## 2023-08-14 DIAGNOSIS — I69398 Other sequelae of cerebral infarction: Secondary | ICD-10-CM | POA: Diagnosis not present

## 2023-08-14 DIAGNOSIS — B957 Other staphylococcus as the cause of diseases classified elsewhere: Secondary | ICD-10-CM | POA: Diagnosis not present

## 2023-08-14 DIAGNOSIS — I6349 Cerebral infarction due to embolism of other cerebral artery: Secondary | ICD-10-CM | POA: Diagnosis not present

## 2023-08-14 DIAGNOSIS — M6281 Muscle weakness (generalized): Secondary | ICD-10-CM | POA: Diagnosis not present

## 2023-08-14 DIAGNOSIS — I69392 Facial weakness following cerebral infarction: Secondary | ICD-10-CM | POA: Diagnosis not present

## 2023-08-14 DIAGNOSIS — E785 Hyperlipidemia, unspecified: Secondary | ICD-10-CM | POA: Diagnosis not present

## 2023-08-14 DIAGNOSIS — N390 Urinary tract infection, site not specified: Secondary | ICD-10-CM | POA: Diagnosis not present

## 2023-08-14 DIAGNOSIS — Z8546 Personal history of malignant neoplasm of prostate: Secondary | ICD-10-CM | POA: Diagnosis not present

## 2023-08-14 DIAGNOSIS — R1310 Dysphagia, unspecified: Secondary | ICD-10-CM | POA: Diagnosis not present

## 2023-08-14 DIAGNOSIS — R471 Dysarthria and anarthria: Secondary | ICD-10-CM | POA: Diagnosis not present

## 2023-08-14 DIAGNOSIS — R2689 Other abnormalities of gait and mobility: Secondary | ICD-10-CM | POA: Diagnosis not present

## 2023-08-14 DIAGNOSIS — R4701 Aphasia: Secondary | ICD-10-CM | POA: Diagnosis not present

## 2023-08-14 DIAGNOSIS — I69391 Dysphagia following cerebral infarction: Secondary | ICD-10-CM | POA: Diagnosis not present

## 2023-08-14 DIAGNOSIS — I69322 Dysarthria following cerebral infarction: Secondary | ICD-10-CM | POA: Diagnosis not present

## 2023-08-14 DIAGNOSIS — K2100 Gastro-esophageal reflux disease with esophagitis, without bleeding: Secondary | ICD-10-CM | POA: Diagnosis not present

## 2023-08-14 DIAGNOSIS — C61 Malignant neoplasm of prostate: Secondary | ICD-10-CM | POA: Diagnosis not present

## 2023-08-14 DIAGNOSIS — K219 Gastro-esophageal reflux disease without esophagitis: Secondary | ICD-10-CM | POA: Diagnosis not present

## 2023-08-14 DIAGNOSIS — H409 Unspecified glaucoma: Secondary | ICD-10-CM | POA: Diagnosis not present

## 2023-08-14 DIAGNOSIS — I69320 Aphasia following cerebral infarction: Secondary | ICD-10-CM | POA: Diagnosis not present

## 2023-08-14 DIAGNOSIS — N179 Acute kidney failure, unspecified: Secondary | ICD-10-CM | POA: Diagnosis not present

## 2023-08-14 DIAGNOSIS — R260 Ataxic gait: Secondary | ICD-10-CM | POA: Diagnosis not present

## 2023-08-14 DIAGNOSIS — I69351 Hemiplegia and hemiparesis following cerebral infarction affecting right dominant side: Secondary | ICD-10-CM | POA: Diagnosis not present

## 2023-08-14 DIAGNOSIS — I82623 Acute embolism and thrombosis of deep veins of upper extremity, bilateral: Secondary | ICD-10-CM | POA: Diagnosis not present

## 2023-08-14 DIAGNOSIS — E860 Dehydration: Secondary | ICD-10-CM | POA: Diagnosis not present

## 2023-08-22 ENCOUNTER — Other Ambulatory Visit: Payer: Self-pay | Admitting: Family Medicine

## 2023-08-26 DIAGNOSIS — I69351 Hemiplegia and hemiparesis following cerebral infarction affecting right dominant side: Secondary | ICD-10-CM | POA: Diagnosis not present

## 2023-08-26 DIAGNOSIS — E785 Hyperlipidemia, unspecified: Secondary | ICD-10-CM | POA: Diagnosis not present

## 2023-08-26 DIAGNOSIS — N179 Acute kidney failure, unspecified: Secondary | ICD-10-CM | POA: Diagnosis not present

## 2023-08-26 DIAGNOSIS — Z7982 Long term (current) use of aspirin: Secondary | ICD-10-CM | POA: Diagnosis not present

## 2023-08-26 DIAGNOSIS — I69320 Aphasia following cerebral infarction: Secondary | ICD-10-CM | POA: Diagnosis not present

## 2023-08-26 DIAGNOSIS — I69322 Dysarthria following cerebral infarction: Secondary | ICD-10-CM | POA: Diagnosis not present

## 2023-08-26 DIAGNOSIS — I69328 Other speech and language deficits following cerebral infarction: Secondary | ICD-10-CM | POA: Diagnosis not present

## 2023-08-26 DIAGNOSIS — I69391 Dysphagia following cerebral infarction: Secondary | ICD-10-CM | POA: Diagnosis not present

## 2023-08-26 DIAGNOSIS — I10 Essential (primary) hypertension: Secondary | ICD-10-CM | POA: Diagnosis not present

## 2023-08-31 DIAGNOSIS — I69351 Hemiplegia and hemiparesis following cerebral infarction affecting right dominant side: Secondary | ICD-10-CM | POA: Diagnosis not present

## 2023-08-31 DIAGNOSIS — I69320 Aphasia following cerebral infarction: Secondary | ICD-10-CM | POA: Diagnosis not present

## 2023-08-31 DIAGNOSIS — E785 Hyperlipidemia, unspecified: Secondary | ICD-10-CM | POA: Diagnosis not present

## 2023-08-31 DIAGNOSIS — I69391 Dysphagia following cerebral infarction: Secondary | ICD-10-CM | POA: Diagnosis not present

## 2023-08-31 DIAGNOSIS — N179 Acute kidney failure, unspecified: Secondary | ICD-10-CM | POA: Diagnosis not present

## 2023-08-31 DIAGNOSIS — I10 Essential (primary) hypertension: Secondary | ICD-10-CM | POA: Diagnosis not present

## 2023-08-31 DIAGNOSIS — I69322 Dysarthria following cerebral infarction: Secondary | ICD-10-CM | POA: Diagnosis not present

## 2023-08-31 DIAGNOSIS — Z7982 Long term (current) use of aspirin: Secondary | ICD-10-CM | POA: Diagnosis not present

## 2023-08-31 DIAGNOSIS — I69328 Other speech and language deficits following cerebral infarction: Secondary | ICD-10-CM | POA: Diagnosis not present

## 2023-09-02 DIAGNOSIS — Z7982 Long term (current) use of aspirin: Secondary | ICD-10-CM | POA: Diagnosis not present

## 2023-09-02 DIAGNOSIS — I69320 Aphasia following cerebral infarction: Secondary | ICD-10-CM | POA: Diagnosis not present

## 2023-09-02 DIAGNOSIS — I69322 Dysarthria following cerebral infarction: Secondary | ICD-10-CM | POA: Diagnosis not present

## 2023-09-02 DIAGNOSIS — I69391 Dysphagia following cerebral infarction: Secondary | ICD-10-CM | POA: Diagnosis not present

## 2023-09-02 DIAGNOSIS — N179 Acute kidney failure, unspecified: Secondary | ICD-10-CM | POA: Diagnosis not present

## 2023-09-02 DIAGNOSIS — E785 Hyperlipidemia, unspecified: Secondary | ICD-10-CM | POA: Diagnosis not present

## 2023-09-02 DIAGNOSIS — I69328 Other speech and language deficits following cerebral infarction: Secondary | ICD-10-CM | POA: Diagnosis not present

## 2023-09-02 DIAGNOSIS — I10 Essential (primary) hypertension: Secondary | ICD-10-CM | POA: Diagnosis not present

## 2023-09-02 DIAGNOSIS — I69351 Hemiplegia and hemiparesis following cerebral infarction affecting right dominant side: Secondary | ICD-10-CM | POA: Diagnosis not present

## 2023-09-03 ENCOUNTER — Other Ambulatory Visit: Payer: Self-pay | Admitting: *Deleted

## 2023-09-03 DIAGNOSIS — R252 Cramp and spasm: Secondary | ICD-10-CM

## 2023-09-03 DIAGNOSIS — R202 Paresthesia of skin: Secondary | ICD-10-CM

## 2023-09-04 ENCOUNTER — Telehealth: Payer: Self-pay | Admitting: Family Medicine

## 2023-09-04 NOTE — Telephone Encounter (Signed)
Pts daughter called stating that pt is doing better since getting out of the hospital but says for the past few days he's been getting short of breath and says he has an inhaler but its not helping. Pt has an appt with PCP on 10/22 but daughter wants to know if theres anything that pt can do in the meantime to help with shortness of breath.

## 2023-09-05 ENCOUNTER — Other Ambulatory Visit: Payer: Self-pay

## 2023-09-05 ENCOUNTER — Inpatient Hospital Stay (HOSPITAL_COMMUNITY)
Admission: EM | Admit: 2023-09-05 | Discharge: 2023-09-08 | DRG: 193 | Disposition: A | Discharge status: Home Health | Payer: Medicare HMO | Attending: Internal Medicine | Admitting: Internal Medicine

## 2023-09-05 ENCOUNTER — Inpatient Hospital Stay (HOSPITAL_COMMUNITY): Payer: Medicare HMO

## 2023-09-05 ENCOUNTER — Emergency Department (HOSPITAL_COMMUNITY): Payer: Medicare HMO

## 2023-09-05 ENCOUNTER — Encounter (HOSPITAL_COMMUNITY): Payer: Self-pay

## 2023-09-05 DIAGNOSIS — Z888 Allergy status to other drugs, medicaments and biological substances status: Secondary | ICD-10-CM

## 2023-09-05 DIAGNOSIS — J181 Lobar pneumonia, unspecified organism: Principal | ICD-10-CM | POA: Insufficient documentation

## 2023-09-05 DIAGNOSIS — I10 Essential (primary) hypertension: Secondary | ICD-10-CM | POA: Diagnosis not present

## 2023-09-05 DIAGNOSIS — N1831 Chronic kidney disease, stage 3a: Secondary | ICD-10-CM | POA: Diagnosis not present

## 2023-09-05 DIAGNOSIS — Z91041 Radiographic dye allergy status: Secondary | ICD-10-CM

## 2023-09-05 DIAGNOSIS — R7303 Prediabetes: Secondary | ICD-10-CM | POA: Diagnosis not present

## 2023-09-05 DIAGNOSIS — Z1152 Encounter for screening for COVID-19: Secondary | ICD-10-CM | POA: Diagnosis not present

## 2023-09-05 DIAGNOSIS — I5022 Chronic systolic (congestive) heart failure: Secondary | ICD-10-CM | POA: Diagnosis not present

## 2023-09-05 DIAGNOSIS — J432 Centrilobular emphysema: Secondary | ICD-10-CM | POA: Diagnosis not present

## 2023-09-05 DIAGNOSIS — J9601 Acute respiratory failure with hypoxia: Secondary | ICD-10-CM

## 2023-09-05 DIAGNOSIS — Z96643 Presence of artificial hip joint, bilateral: Secondary | ICD-10-CM | POA: Diagnosis not present

## 2023-09-05 DIAGNOSIS — I5031 Acute diastolic (congestive) heart failure: Secondary | ICD-10-CM | POA: Insufficient documentation

## 2023-09-05 DIAGNOSIS — E876 Hypokalemia: Secondary | ICD-10-CM | POA: Diagnosis not present

## 2023-09-05 DIAGNOSIS — I5021 Acute systolic (congestive) heart failure: Secondary | ICD-10-CM

## 2023-09-05 DIAGNOSIS — J9811 Atelectasis: Secondary | ICD-10-CM | POA: Diagnosis not present

## 2023-09-05 DIAGNOSIS — I700 Atherosclerosis of aorta: Secondary | ICD-10-CM | POA: Diagnosis not present

## 2023-09-05 DIAGNOSIS — Z8546 Personal history of malignant neoplasm of prostate: Secondary | ICD-10-CM

## 2023-09-05 DIAGNOSIS — Z87891 Personal history of nicotine dependence: Secondary | ICD-10-CM | POA: Diagnosis not present

## 2023-09-05 DIAGNOSIS — M1990 Unspecified osteoarthritis, unspecified site: Secondary | ICD-10-CM | POA: Diagnosis present

## 2023-09-05 DIAGNOSIS — R0603 Acute respiratory distress: Secondary | ICD-10-CM

## 2023-09-05 DIAGNOSIS — I480 Paroxysmal atrial fibrillation: Secondary | ICD-10-CM | POA: Diagnosis not present

## 2023-09-05 DIAGNOSIS — I2510 Atherosclerotic heart disease of native coronary artery without angina pectoris: Secondary | ICD-10-CM | POA: Diagnosis not present

## 2023-09-05 DIAGNOSIS — I130 Hypertensive heart and chronic kidney disease with heart failure and stage 1 through stage 4 chronic kidney disease, or unspecified chronic kidney disease: Secondary | ICD-10-CM | POA: Diagnosis present

## 2023-09-05 DIAGNOSIS — H409 Unspecified glaucoma: Secondary | ICD-10-CM | POA: Diagnosis not present

## 2023-09-05 DIAGNOSIS — J189 Pneumonia, unspecified organism: Principal | ICD-10-CM | POA: Diagnosis present

## 2023-09-05 DIAGNOSIS — R0602 Shortness of breath: Secondary | ICD-10-CM | POA: Diagnosis not present

## 2023-09-05 DIAGNOSIS — Z8249 Family history of ischemic heart disease and other diseases of the circulatory system: Secondary | ICD-10-CM

## 2023-09-05 DIAGNOSIS — Z7982 Long term (current) use of aspirin: Secondary | ICD-10-CM

## 2023-09-05 DIAGNOSIS — J440 Chronic obstructive pulmonary disease with acute lower respiratory infection: Secondary | ICD-10-CM | POA: Diagnosis present

## 2023-09-05 DIAGNOSIS — Z79899 Other long term (current) drug therapy: Secondary | ICD-10-CM

## 2023-09-05 DIAGNOSIS — J439 Emphysema, unspecified: Secondary | ICD-10-CM | POA: Diagnosis not present

## 2023-09-05 DIAGNOSIS — R7989 Other specified abnormal findings of blood chemistry: Secondary | ICD-10-CM | POA: Diagnosis not present

## 2023-09-05 DIAGNOSIS — R600 Localized edema: Secondary | ICD-10-CM | POA: Diagnosis not present

## 2023-09-05 DIAGNOSIS — R042 Hemoptysis: Secondary | ICD-10-CM | POA: Diagnosis not present

## 2023-09-05 DIAGNOSIS — K219 Gastro-esophageal reflux disease without esophagitis: Secondary | ICD-10-CM | POA: Diagnosis present

## 2023-09-05 DIAGNOSIS — R918 Other nonspecific abnormal finding of lung field: Secondary | ICD-10-CM | POA: Diagnosis not present

## 2023-09-05 DIAGNOSIS — E782 Mixed hyperlipidemia: Secondary | ICD-10-CM | POA: Diagnosis present

## 2023-09-05 DIAGNOSIS — I69351 Hemiplegia and hemiparesis following cerebral infarction affecting right dominant side: Secondary | ICD-10-CM

## 2023-09-05 DIAGNOSIS — I771 Stricture of artery: Secondary | ICD-10-CM | POA: Diagnosis not present

## 2023-09-05 DIAGNOSIS — I4891 Unspecified atrial fibrillation: Secondary | ICD-10-CM | POA: Diagnosis not present

## 2023-09-05 DIAGNOSIS — Z555 Less than a high school diploma: Secondary | ICD-10-CM | POA: Diagnosis not present

## 2023-09-05 HISTORY — DX: Acute respiratory failure with hypoxia: J96.01

## 2023-09-05 HISTORY — DX: Acute diastolic (congestive) heart failure: I50.31

## 2023-09-05 LAB — CBC WITH DIFFERENTIAL/PLATELET
Abs Immature Granulocytes: 0.03 10*3/uL (ref 0.00–0.07)
Basophils Absolute: 0.1 10*3/uL (ref 0.0–0.1)
Basophils Relative: 1 %
Eosinophils Absolute: 0.2 10*3/uL (ref 0.0–0.5)
Eosinophils Relative: 2 %
HCT: 39.7 % (ref 39.0–52.0)
Hemoglobin: 12.9 g/dL — ABNORMAL LOW (ref 13.0–17.0)
Immature Granulocytes: 0 %
Lymphocytes Relative: 16 %
Lymphs Abs: 1.2 10*3/uL (ref 0.7–4.0)
MCH: 31.2 pg (ref 26.0–34.0)
MCHC: 32.5 g/dL (ref 30.0–36.0)
MCV: 95.9 fL (ref 80.0–100.0)
Monocytes Absolute: 0.8 10*3/uL (ref 0.1–1.0)
Monocytes Relative: 11 %
Neutro Abs: 5.2 10*3/uL (ref 1.7–7.7)
Neutrophils Relative %: 70 %
Platelets: 173 10*3/uL (ref 150–400)
RBC: 4.14 MIL/uL — ABNORMAL LOW (ref 4.22–5.81)
RDW: 14.6 % (ref 11.5–15.5)
WBC: 7.5 10*3/uL (ref 4.0–10.5)
nRBC: 0 % (ref 0.0–0.2)

## 2023-09-05 LAB — MRSA NEXT GEN BY PCR, NASAL: MRSA by PCR Next Gen: NOT DETECTED

## 2023-09-05 LAB — COMPREHENSIVE METABOLIC PANEL
ALT: 23 U/L (ref 0–44)
AST: 19 U/L (ref 15–41)
Albumin: 3.7 g/dL (ref 3.5–5.0)
Alkaline Phosphatase: 75 U/L (ref 38–126)
Anion gap: 7 (ref 5–15)
BUN: 19 mg/dL (ref 8–23)
CO2: 27 mmol/L (ref 22–32)
Calcium: 9.2 mg/dL (ref 8.9–10.3)
Chloride: 105 mmol/L (ref 98–111)
Creatinine, Ser: 1.05 mg/dL (ref 0.61–1.24)
GFR, Estimated: >60 mL/min (ref >60)
Glucose, Bld: 103 mg/dL — ABNORMAL HIGH (ref 70–99)
Potassium: 4.1 mmol/L (ref 3.5–5.1)
Sodium: 139 mmol/L (ref 135–145)
Total Bilirubin: 1.3 mg/dL — ABNORMAL HIGH (ref 0.3–1.2)
Total Protein: 6.8 g/dL (ref 6.5–8.1)

## 2023-09-05 LAB — BRAIN NATRIURETIC PEPTIDE: B Natriuretic Peptide: 388 pg/mL — ABNORMAL HIGH (ref 0.0–100.0)

## 2023-09-05 LAB — RESP PANEL BY RT-PCR (RSV, FLU A&B, COVID)  RVPGX2
Influenza A by PCR: NEGATIVE
Influenza B by PCR: NEGATIVE
Resp Syncytial Virus by PCR: NEGATIVE
SARS Coronavirus 2 by RT PCR: NEGATIVE

## 2023-09-05 LAB — PROCALCITONIN: Procalcitonin: <0.1 ng/mL

## 2023-09-05 LAB — BLOOD GAS, VENOUS
Acid-Base Excess: 4.2 mmol/L — ABNORMAL HIGH (ref 0.0–2.0)
Bicarbonate: 29.2 mmol/L — ABNORMAL HIGH (ref 20.0–28.0)
Drawn by: 23968
O2 Saturation: 64.6 %
Patient temperature: 37.1
pCO2, Ven: 44 mm[Hg] (ref 44–60)
pH, Ven: 7.43 (ref 7.25–7.43)
pO2, Ven: 35 mm[Hg] (ref 32–45)

## 2023-09-05 LAB — D-DIMER, QUANTITATIVE: D-Dimer, Quant: 1.17 ug{FEU}/mL — ABNORMAL HIGH (ref 0.00–0.50)

## 2023-09-05 LAB — TROPONIN I (HIGH SENSITIVITY)
Troponin I (High Sensitivity): 9 ng/L (ref <18)
Troponin I (High Sensitivity): 8 ng/L (ref <18)

## 2023-09-05 MED ORDER — SODIUM CHLORIDE 0.9% FLUSH
3.0000 mL | Freq: Two times a day (BID) | INTRAVENOUS | Status: DC
  Administered 2023-09-05 – 2023-09-08 (×4): 3 mL via INTRAVENOUS

## 2023-09-05 MED ORDER — SODIUM CHLORIDE 0.9% FLUSH
10.0000 mL | Freq: Two times a day (BID) | INTRAVENOUS | Status: DC
  Administered 2023-09-05 – 2023-09-08 (×7): 10 mL via INTRAVENOUS

## 2023-09-05 MED ORDER — SODIUM CHLORIDE 0.9 % IV SOLN
500.0000 mg | Freq: Once | INTRAVENOUS | Status: AC
  Administered 2023-09-05: 500 mg via INTRAVENOUS
  Filled 2023-09-05: qty 5

## 2023-09-05 MED ORDER — ASPIRIN 81 MG PO CHEW
81.0000 mg | CHEWABLE_TABLET | Freq: Every day | ORAL | Status: DC
  Administered 2023-09-05 – 2023-09-08 (×4): 81 mg via ORAL
  Filled 2023-09-05 (×4): qty 1

## 2023-09-05 MED ORDER — SODIUM CHLORIDE 0.9% FLUSH: 3.0000 mL | INTRAVENOUS | Status: DC | PRN

## 2023-09-05 MED ORDER — FUROSEMIDE 10 MG/ML IJ SOLN
40.0000 mg | Freq: Once | INTRAMUSCULAR | Status: AC
  Administered 2023-09-05: 40 mg via INTRAVENOUS
  Filled 2023-09-05: qty 4

## 2023-09-05 MED ORDER — FUROSEMIDE 10 MG/ML IJ SOLN: 20.0000 mg | Freq: Once | INTRAMUSCULAR | Status: DC

## 2023-09-05 MED ORDER — GABAPENTIN 300 MG PO CAPS
300.0000 mg | ORAL_CAPSULE | Freq: Two times a day (BID) | ORAL | Status: DC
  Filled 2023-09-05: qty 1

## 2023-09-05 MED ORDER — DORZOLAMIDE HCL-TIMOLOL MAL 2-0.5 % OP SOLN
1.0000 [drp] | Freq: Every day | OPHTHALMIC | Status: DC
  Administered 2023-09-05 – 2023-09-08 (×4): 1 [drp] via OPHTHALMIC
  Filled 2023-09-05 (×2): qty 10

## 2023-09-05 MED ORDER — ROPINIROLE HCL 1 MG PO TABS
1.0000 mg | ORAL_TABLET | Freq: Three times a day (TID) | ORAL | Status: DC
  Administered 2023-09-05 – 2023-09-08 (×9): 1 mg via ORAL
  Filled 2023-09-05 (×9): qty 1

## 2023-09-05 MED ORDER — ENOXAPARIN SODIUM 40 MG/0.4ML IJ SOSY
40.0000 mg | PREFILLED_SYRINGE | INTRAMUSCULAR | Status: DC
  Administered 2023-09-05 – 2023-09-06 (×2): 40 mg via SUBCUTANEOUS
  Filled 2023-09-05 (×2): qty 0.4

## 2023-09-05 MED ORDER — SODIUM CHLORIDE 0.9 % IV SOLN
1.0000 g | Freq: Once | INTRAVENOUS | Status: AC
  Administered 2023-09-05: 1 g via INTRAVENOUS
  Filled 2023-09-05: qty 10

## 2023-09-05 MED ORDER — METOPROLOL SUCCINATE ER 50 MG PO TB24
50.0000 mg | ORAL_TABLET | Freq: Every day | ORAL | Status: DC
  Administered 2023-09-05 – 2023-09-08 (×4): 50 mg via ORAL
  Filled 2023-09-05 (×4): qty 1

## 2023-09-05 MED ORDER — FUROSEMIDE 10 MG/ML IJ SOLN
40.0000 mg | Freq: Every day | INTRAMUSCULAR | Status: DC
  Administered 2023-09-06 – 2023-09-08 (×3): 40 mg via INTRAVENOUS
  Filled 2023-09-05 (×3): qty 4

## 2023-09-05 MED ORDER — FLUTICASONE PROPIONATE 50 MCG/ACT NA SUSP
2.0000 | Freq: Every day | NASAL | Status: DC
  Administered 2023-09-05 – 2023-09-08 (×4): 2 via NASAL
  Filled 2023-09-05 (×2): qty 16

## 2023-09-05 MED ORDER — IPRATROPIUM-ALBUTEROL 0.5-2.5 (3) MG/3ML IN SOLN
3.0000 mL | Freq: Once | RESPIRATORY_TRACT | Status: AC
  Administered 2023-09-05: 3 mL via RESPIRATORY_TRACT
  Filled 2023-09-05: qty 3

## 2023-09-05 MED ORDER — AZITHROMYCIN 250 MG PO TABS
500.0000 mg | ORAL_TABLET | Freq: Every day | ORAL | Status: DC
  Administered 2023-09-06 – 2023-09-07 (×2): 500 mg via ORAL
  Filled 2023-09-05 (×2): qty 2

## 2023-09-05 MED ORDER — ATORVASTATIN CALCIUM 40 MG PO TABS
40.0000 mg | ORAL_TABLET | Freq: Every day | ORAL | Status: DC
  Administered 2023-09-05 – 2023-09-07 (×3): 40 mg via ORAL
  Filled 2023-09-05 (×3): qty 1

## 2023-09-05 MED ORDER — PANTOPRAZOLE SODIUM 40 MG PO TBEC
40.0000 mg | DELAYED_RELEASE_TABLET | Freq: Every day | ORAL | Status: DC
  Administered 2023-09-05 – 2023-09-08 (×4): 40 mg via ORAL
  Filled 2023-09-05 (×4): qty 1

## 2023-09-05 MED ORDER — PREDNISONE 20 MG PO TABS: 50.0000 mg | ORAL_TABLET | Freq: Four times a day (QID) | ORAL | Status: DC

## 2023-09-05 MED ORDER — SODIUM CHLORIDE 0.9 % IV SOLN
1.0000 g | INTRAVENOUS | Status: DC
  Administered 2023-09-06 – 2023-09-07 (×2): 1 g via INTRAVENOUS
  Filled 2023-09-05 (×3): qty 10

## 2023-09-05 MED ORDER — IOHEXOL 350 MG/ML SOLN
100.0000 mL | Freq: Once | INTRAVENOUS | Status: AC | PRN
  Administered 2023-09-05: 75 mL via INTRAVENOUS

## 2023-09-05 MED ORDER — DIPHENHYDRAMINE HCL 50 MG/ML IJ SOLN: 50.0000 mg | Freq: Once | INTRAMUSCULAR | Status: DC

## 2023-09-05 MED ORDER — ACETAMINOPHEN 325 MG PO TABS: 650.0000 mg | ORAL_TABLET | ORAL | Status: DC | PRN

## 2023-09-05 MED ORDER — ONDANSETRON HCL 4 MG/2ML IJ SOLN: 4.0000 mg | Freq: Four times a day (QID) | INTRAMUSCULAR | Status: DC | PRN

## 2023-09-05 MED ORDER — ROSUVASTATIN CALCIUM 10 MG PO TABS
5.0000 mg | ORAL_TABLET | Freq: Every day | ORAL | Status: DC
  Filled 2023-09-05: qty 1

## 2023-09-05 MED ORDER — DIPHENHYDRAMINE HCL 25 MG PO CAPS: 50.0000 mg | ORAL_CAPSULE | Freq: Once | ORAL | Status: DC

## 2023-09-05 NOTE — Progress Notes (Addendum)
Patient alert and verbal, tolerated medications whole with no complaints. Ambulated patient with one assist in room, patient does report shortness of breath on exertion. Verbalized no complaints of pain during shift. Noted patient coughing up blood with sputum. MD Tat made aware. No new orders.

## 2023-09-05 NOTE — ED Provider Notes (Signed)
[place] EMERGENCY DEPARTMENT AT [facility] Provider Note  CSN: [id number] Arrival date & time: 09/05/23 0745  Chief Complaint(s) Shortness of Breath  HPI Joshua Compton. is a 82 y.o. male with past medical history as below, significant for prostate cancer, GERD, HTN, HLD, prior CVA with RUE weakness who presents to the ED with complaint of dib/cough  Difficulty breathing over the past 48 hours, worsened in the past 12 hours.  productive cough with white sputum.  No fevers or chills.  No chest pain. No sick contacts or fevers, no home o2 use. Noticed worsened leg swelling over last 2 days or so, unsure regarding weight gain   Past Medical History Past Medical History:  Diagnosis Date   Arthritis    Cancer (HCC)    prostate ca   seed implants   GERD (gastroesophageal reflux disease)    Glaucoma    Hyperlipidemia    Hypertension    dr Ruben Im   fm   Patient Active Problem List   Diagnosis Date Noted   Acute respiratory failure with hypoxia (HCC) 09/05/2023   Paresthesia of both feet 07/28/2023   Gastroesophageal reflux disease with esophagitis without hemorrhage 09/10/2021   Primary insomnia 09/10/2021   Vasculogenic erectile dysfunction 09/10/2021   Status post total replacement of left hip 01/07/2021   Primary osteoarthritis of left hip 01/06/2021   Leg cramps 12/11/2020   Thyromegaly 05/19/2017   Neoplasm of scalp 12/08/2013   Rhinitis 12/08/2013   Cough 12/08/2013   Hyperlipidemia    Cancer (HCC)    Arthritis    Severe stage glaucoma 03/26/2012   Nuclear sclerosis 03/26/2012   Primary open angle glaucoma 10/27/2011   Essential hypertension 02/04/2011   Degenerative disc disease 02/04/2011   Gastrointestinal bleed 02/04/2011   Anemia 02/04/2011   Prostate cancer (HCC) 02/04/2011   Home Medication(s) Prior to Admission medications   Medication Sig Start Date End Date Taking? Authorizing Provider  albuterol (VENTOLIN HFA) 108 (90  Base) MCG/ACT inhaler Inhale 1-2 puffs into the lungs every 6 (six) hours as needed for wheezing or shortness of breath. 04/06/23   Cristi Loron, Celeste A, PA-C  dorzolamide-timolol (COSOPT) 22.3-6.8 MG/ML ophthalmic solution Place 1 drop into both eyes daily.  08/04/16   [provider]  Enalapril-hydroCHLOROthiazide 5-12.5 MG tablet Take 1 tablet by mouth daily. 07/28/23   [name], MD  fluticasone (FLONASE) 50 MCG/ACT nasal spray Place 2 sprays into both nostrils daily. 03/23/23   Daphine Deutscher, Mary-Margaret, FNP  gabapentin (NEURONTIN) 300 MG capsule 1 at bedtime for1 week then 2  The next  week then 3 the next week then 4 daily. 07/28/23   [name], MD  metoprolol succinate (TOPROL-XL) 50 MG 24 hr tablet TAKE 1 TABLET (50 MG TOTAL) BY MOUTH DAILY. FOR BLOOD PRESSURE CONTROL 08/24/23   [name], MD  pantoprazole (PROTONIX) 40 MG tablet Take 1 tablet (40 mg total) by mouth daily. For heartburn 07/28/23   [name], MD  rOPINIRole (REQUIP) 1 MG tablet Take 1 tablet (1 mg total) by mouth 3 (three) times daily. Plus an extra tablet at bedtime to total four a day 12/16/22   [name], MD  rosuvastatin (CRESTOR) 5 MG tablet Take 1 tablet (5 mg total) by mouth daily. 07/28/23   [name], MD  sildenafil (REVATIO) 20 MG tablet Take 2-5 pills at once, orally, with each sexual encounter 09/10/21   [name], MD  Past Surgical History Past Surgical History:  Procedure Laterality Date   BACK SURGERY     x2   COLONOSCOPY     HERNIA REPAIR  11-06-2009   left inguinal repair    NECK SURGERY     ROTATOR CUFF REPAIR Left jan 2016   TOTAL HIP ARTHROPLASTY Right 04/20/2020   Procedure: RIGHT TOTAL HIP ARTHROPLASTY ANTERIOR APPROACH;  Surgeon: Tarry Kos, MD;  Location: [facility] OR;  Service: Orthopedics;  Laterality: Right;   TOTAL HIP ARTHROPLASTY Left  01/07/2021   Procedure: LEFT TOTAL HIP ARTHROPLASTY ANTERIOR APPROACH;  Surgeon: Tarry Kos, MD;  Location: [facility] OR;  Service: Orthopedics;  Laterality: Left;   TRANSURETHRAL RESECTION OF PROSTATE     Family History Family History  Problem Relation Age of Onset   Cancer Mother        breast   Heart disease Father    Cancer Brother    Cancer Brother     Social History Social History   Tobacco Use   Smoking status: Former    Current packs/day: 0.00    Average packs/day: 0.5 packs/day for 30.0 years (15.0 ttl pk-yrs)    Types: Cigarettes    Start date: 03/29/1975    Quit date: 03/28/2005    Years since quitting: 18.4   Smokeless tobacco: Never  Vaping Use   Vaping status: Never Used  Substance Use Topics   Alcohol use: No    Alcohol/week: 0.0 standard drinks of alcohol   Drug use: No   Allergies Iodinated contrast media and Pravastatin sodium  Review of Systems Review of Systems  Constitutional:  Negative for chills and fever.  Respiratory:  Positive for cough and shortness of breath.   Cardiovascular:  Positive for leg swelling. Negative for palpitations.  Gastrointestinal:  Negative for abdominal pain and nausea.  Genitourinary:  Negative for dysuria.  Skin:  Negative for pallor.  All other systems reviewed and are negative.   Physical Exam Vital Signs  I have reviewed the triage vital signs BP (!) 148/71 (BP Location: Left Arm)   Pulse 76   Temp 98.2 F (36.8 C) (Oral)   Resp 20   Ht 6' (1.829 m)   Wt 92.5 kg   SpO2 95%   BMI 27.67 kg/m  Physical Exam Vitals and nursing note reviewed.  Constitutional:      General: He is in acute distress.     Appearance: He is well-developed.  HENT:     Head: Normocephalic and atraumatic.     Right Ear: External ear normal.     Left Ear: External ear normal.     Mouth/Throat:     Mouth: Mucous membranes are moist.  Eyes:     General: No scleral icterus. Cardiovascular:     Rate and Rhythm: Normal rate and  regular rhythm.     Pulses: Normal pulses.     Heart sounds: Normal heart sounds.  Pulmonary:     Effort: Tachypnea and respiratory distress present.     Breath sounds: Decreased breath sounds present.     Comments: Diminished b/l Chest:    Abdominal:     General: Abdomen is flat.     Palpations: Abdomen is soft.     Tenderness: There is no abdominal tenderness.  Musculoskeletal:     Cervical back: No rigidity.     Right lower leg: Edema present.     Left lower leg: Edema present.  Skin:    General: Skin is warm and dry.  Capillary Refill: Capillary refill takes less than 2 seconds.  Neurological:     Mental Status: He is alert.     Comments: Weakness RUE from prior cva  Psychiatric:        Mood and Affect: Mood normal.        Behavior: Behavior normal.     ED Results and Treatments Labs (all labs ordered are listed, but only abnormal results are displayed) Labs Reviewed  BRAIN NATRIURETIC PEPTIDE - Abnormal; Notable for the following components:      Result Value   B Natriuretic Peptide 388.0 (*)    All other components within normal limits  CBC WITH DIFFERENTIAL/PLATELET - Abnormal; Notable for the following components:   RBC 4.14 (*)    Hemoglobin 12.9 (*)    All other components within normal limits  BLOOD GAS, VENOUS - Abnormal; Notable for the following components:   Bicarbonate 29.2 (*)    Acid-Base Excess 4.2 (*)    All other components within normal limits  COMPREHENSIVE METABOLIC PANEL - Abnormal; Notable for the following components:   Glucose, Bld 103 (*)    Total Bilirubin 1.3 (*)    All other components within normal limits  RESP PANEL BY RT-PCR (RSV, FLU A&B, COVID)  RVPGX2  TROPONIN I (HIGH SENSITIVITY)  TROPONIN I (HIGH SENSITIVITY)                                                                                                                          Radiology DG Chest Port 1 View  Result Date: 09/05/2023 CLINICAL DATA:  Shortness of  breath since yesterday EXAM: PORTABLE CHEST 1 VIEW COMPARISON:  04/30/2023 FINDINGS: Interstitial coarsening not seen previously. There is asymmetric density in the right lower lung. Emphysema by prior CT. Normal heart size and mediastinal contours. Stable aortic tortuosity. IMPRESSION: 1. Infiltrate in the right lung suggesting pneumonia. Followup PA and lateral chest X-ray is recommended in 3-4 weeks following trial of antibiotic therapy to ensure resolution. 2. Generalized interstitial coarsening which may be underlying bronchitis. 3. Emphysema. Electronically Signed   By: Tiburcio Pea M.D.   On: 09/05/2023 08:47    Pertinent labs & imaging results that were available during my care of the patient were reviewed by me and considered in my medical decision making (see MDM for details).  Medications Ordered in ED Medications  azithromycin (ZITHROMAX) 500 mg in sodium chloride 0.9 % 250 mL IVPB (has no administration in time range)  ipratropium-albuterol (DUONEB) 0.5-2.5 (3) MG/3ML nebulizer solution 3 mL (has no administration in time range)  furosemide (LASIX) injection 20 mg (has no administration in time range)  cefTRIAXone (ROCEPHIN) 1 g in sodium chloride 0.9 % 100 mL IVPB (1 g Intravenous New Bag/Given 09/05/23 1006)  Procedures .Critical Care  Performed by: Sloan Leiter, DO Authorized by: Sloan Leiter, DO   Critical care provider statement:    Critical care time (minutes):  45   Critical care time was exclusive of:  Separately billable procedures and treating other patients   Critical care was necessary to treat or prevent imminent or life-threatening deterioration of the following conditions:  Respiratory failure   Critical care was time spent personally by me on the following activities:  Development of treatment plan with patient or surrogate,  discussions with consultants, evaluation of patient's response to treatment, examination of patient, ordering and review of laboratory studies, ordering and review of radiographic studies, ordering and performing treatments and interventions, pulse oximetry, re-evaluation of patient's condition, review of old charts and obtaining history from patient or surrogate   Care discussed with: admitting provider     (including critical care time)  Medical Decision Making / ED Course    Medical Decision Making:    Mcgarrett Sonner. is a 83 y.o. male with past medical history as below, significant for prostate cancer, GERD, HTN, HLD, prior CVA with RUE weakness who presents to the ED with complaint of dib/cough. The complaint involves an extensive differential diagnosis and also carries with it a high risk of complications and morbidity.  Serious etiology was considered. Ddx includes but is not limited to: In my evaluation of this patient's dyspnea my DDx includes, but is not limited to, pneumonia, pulmonary embolism, pneumothorax, pulmonary edema, metabolic acidosis, asthma, COPD, cardiac cause, anemia, anxiety, etc.    Complete initial physical exam performed, notably the patient  was resp distress, pulse ox 70's on RA, better on 4LNC.    Reviewed and confirmed nursing documentation for past medical history, family history, social history.  Vital signs reviewed.        Pt arrives w/ dyspnea, resp distress on arrival, now improved on 4LNC, pulse ox around 95%, was 70's on arrival, WOB improved   LE edema noted b/l > new  Patient found to have pneumonia on imaging, this is consistent with exam.  He has no leukocytosis, vital signs are stable.  He is not septic  Ordered Rocephin azithromycin continue 4 L nasal cannula  Has lower extreme edema, BNP is 388, no history of heart failure per patient, no available echo to review.  Start gentle diuresis.  Recommend further evaluation with  echo  Recommend admission for respiratory failure is likely due to pneumonia.  Patient is agreeable  Accepted Dr Tat               Additional history obtained: -Additional history obtained from family -External records from outside source obtained and reviewed including: Chart review including previous notes, labs, imaging, consultation notes including  Prior admission Home meds Admission at [facility] 9/20 ischemic CVA   Lab Tests: -I ordered, reviewed, and interpreted labs.   The pertinent results include:   Labs Reviewed  BRAIN NATRIURETIC PEPTIDE - Abnormal; Notable for the following components:      Result Value   B Natriuretic Peptide 388.0 (*)    All other components within normal limits  CBC WITH DIFFERENTIAL/PLATELET - Abnormal; Notable for the following components:   RBC 4.14 (*)    Hemoglobin 12.9 (*)    All other components within normal limits  BLOOD GAS, VENOUS - Abnormal; Notable for the following components:   Bicarbonate 29.2 (*)    Acid-Base Excess 4.2 (*)    All other components within normal  limits  COMPREHENSIVE METABOLIC PANEL - Abnormal; Notable for the following components:   Glucose, Bld 103 (*)    Total Bilirubin 1.3 (*)    All other components within normal limits  RESP PANEL BY RT-PCR (RSV, FLU A&B, COVID)  RVPGX2  TROPONIN I (HIGH SENSITIVITY)  TROPONIN I (HIGH SENSITIVITY)    Notable for BNP 388  EKG   EKG Interpretation Date/Time:  Saturday September 05 2023 08:32:53 EDT Ventricular Rate:  75 PR Interval:  183 QRS Duration:  98 QT Interval:  404 QTC Calculation: 452 R Axis:   59  Text Interpretation: Sinus rhythm Confirmed by Tanda Rockers ([id number]) on 09/05/2023 8:40:56 AM         Imaging Studies ordered: I ordered imaging studies including CXR I independently visualized the following imaging with scope of interpretation limited to determining acute life threatening conditions related to emergency care; findings noted  above I independently visualized and interpreted imaging. I agree with the radiologist interpretation   Medicines ordered and prescription drug management: Meds ordered this encounter  Medications   cefTRIAXone (ROCEPHIN) 1 g in sodium chloride 0.9 % 100 mL IVPB    Order Specific Question:   Antibiotic Indication:    Answer:   CAP   azithromycin (ZITHROMAX) 500 mg in sodium chloride 0.9 % 250 mL IVPB    Order Specific Question:   Antibiotic Indication:    Answer:   CAP   ipratropium-albuterol (DUONEB) 0.5-2.5 (3) MG/3ML nebulizer solution 3 mL   furosemide (LASIX) injection 20 mg    -I have reviewed the patients home medicines and have made adjustments as needed   Consultations Obtained: na   Cardiac Monitoring: The patient was maintained on a cardiac monitor.  I personally viewed and interpreted the cardiac monitored which showed an underlying rhythm of: NSR  Social Determinants of Health:  Diagnosis or treatment significantly limited by social determinants of health: former smoker   Reevaluation: After the interventions noted above, I reevaluated the patient and found that they have improved  Co morbidities that complicate the patient evaluation  Past Medical History:  Diagnosis Date   Arthritis    Cancer (HCC)    prostate ca   seed implants   GERD (gastroesophageal reflux disease)    Glaucoma    Hyperlipidemia    Hypertension    dr Modesto Charon     Illene Labrador   fm      Dispostion: Disposition decision including need for hospitalization was considered, and patient admitted to the hospital.    Final Clinical Impression(s) / ED Diagnoses Final diagnoses:  Respiratory distress  Community acquired pneumonia, unspecified laterality        Sloan Leiter, DO 09/05/23 1040

## 2023-09-05 NOTE — ED Triage Notes (Signed)
Pt states he has been SOB since yesterday, and came today to get checked out. Had stroke 9/19 then wearing heart monitor since rehab. Stroke affected right side/ arm. Pt does not use supplemental O2 at home. Does use inhaler, last used last night per pt.

## 2023-09-05 NOTE — H&P (Signed)
History and Physical    Patient: Joshua Compton. GNF:[id number] DOB: 07-14-1941 DOA: 09/05/2023 DOS: the patient was seen and examined on 09/05/2023 PCP: [name], MD  Patient coming from: Home  Chief Complaint:  Chief Complaint  Patient presents with   Shortness of Breath   HPI: Joshua Schmit. is a 82 year old male with a history of prostate cancer, hypertension, hyperlipidemia, prediabetes, stroke with right hemiparesis, GERD presenting with 2-day history of shortness of breath.  The patient has had some associated coughing with white-yellow sputum.  He is complaining of some generalized weakness and myalgias.  He denies any chest pain, nausea, vomiting, direct abdominal pain, dysuria, hematuria, headache, neck pain. The patient has noted some increasing lower extremity edema over the past week.  He has had orthopnea type symptoms.  He has had to sleep in a recliner for the past 2 weeks. Notably, the patient was recently admitted to the hospital from 08/07/2023 to 08/14/2023.  He was transferred to [facility] from [facility] for right-sided weakness and aphasia.  The patient was found to have an acute left frontal lobe infarct that was felt to be cryptogenic.  CTA of the head and neck was negative for LVO.  Echocardiogram showed EF 50-55%, no WMA, normal diastolic function, normal RV function, trace MR/TR, mild AI, no AS.  He was discharged to inpatient rehab where he stayed from 08/14/2023 to 08/24/2023.  He was discharged with aspirin 81 mg daily and Lipitor 40 mg daily for secondary prophylaxis.  He has since returned home to live with his daughter and spouse. In the ED, the patient was afebrile and hemodynamically stable.  Oxygen saturation was in the 70s on room air.  On 4 L nasal cannula oxygen saturation is 95%.  WBC 7.5, hemoglobin 12.9, platelets 173.  Sodium 139, potassium 4.1, bicarbonate 27, serum creatinine 1.05.  LFTs unremarkable.  COVID-19 PCR is negative.  EKG shows sinus  rhythm with nonspecific T wave changes.  VBG showed 7.4 3/44/35/29.  Chest x-ray showed increased interstitial markings, right lower lobe infiltrate.  The patient was started on ceftriaxone, azithromycin, and given furosemide 20 mg IV x 1.  Review of Systems: As mentioned in the history of present illness. All other systems reviewed and are negative. Past Medical History:  Diagnosis Date   Arthritis    Cancer (HCC)    prostate ca   seed implants   GERD (gastroesophageal reflux disease)    Glaucoma    Hyperlipidemia    Hypertension    dr Ruben Im   fm   Past Surgical History:  Procedure Laterality Date   BACK SURGERY     x2   COLONOSCOPY     HERNIA REPAIR  11-06-2009   left inguinal repair    NECK SURGERY     ROTATOR CUFF REPAIR Left jan 2016   TOTAL HIP ARTHROPLASTY Right 04/20/2020   Procedure: RIGHT TOTAL HIP ARTHROPLASTY ANTERIOR APPROACH;  Surgeon: Tarry Kos, MD;  Location: [facility] OR;  Service: Orthopedics;  Laterality: Right;   TOTAL HIP ARTHROPLASTY Left 01/07/2021   Procedure: LEFT TOTAL HIP ARTHROPLASTY ANTERIOR APPROACH;  Surgeon: Tarry Kos, MD;  Location: [facility] OR;  Service: Orthopedics;  Laterality: Left;   TRANSURETHRAL RESECTION OF PROSTATE     Social History:  reports that he quit smoking about 18 years ago. His smoking use included cigarettes. He started smoking about 48 years ago. He has a 15 pack-year smoking history. He has never used  smokeless tobacco. He reports that he does not drink alcohol and does not use drugs.  Allergies  Allergen Reactions   Iodinated Contrast Media     Tongue swelled up when he had MRI in [place]   Pravastatin Sodium     Muscle aches    Family History  Problem Relation Age of Onset   Cancer Mother        breast   Heart disease Father    Cancer Brother    Cancer Brother     Prior to Admission medications   Medication Sig Start Date End Date Taking? Authorizing Provider  albuterol (VENTOLIN HFA) 108 (90 Base)  MCG/ACT inhaler Inhale 1-2 puffs into the lungs every 6 (six) hours as needed for wheezing or shortness of breath. 04/06/23   Cristi Loron, Celeste A, PA-C  dorzolamide-timolol (COSOPT) 22.3-6.8 MG/ML ophthalmic solution Place 1 drop into both eyes daily.  08/04/16   [provider]  Enalapril-hydroCHLOROthiazide 5-12.5 MG tablet Take 1 tablet by mouth daily. 07/28/23   [name], MD  fluticasone (FLONASE) 50 MCG/ACT nasal spray Place 2 sprays into both nostrils daily. 03/23/23   Daphine Deutscher, Mary-Margaret, FNP  gabapentin (NEURONTIN) 300 MG capsule 1 at bedtime for1 week then 2  The next  week then 3 the next week then 4 daily. 07/28/23   [name], MD  metoprolol succinate (TOPROL-XL) 50 MG 24 hr tablet TAKE 1 TABLET (50 MG TOTAL) BY MOUTH DAILY. FOR BLOOD PRESSURE CONTROL 08/24/23   [name], MD  pantoprazole (PROTONIX) 40 MG tablet Take 1 tablet (40 mg total) by mouth daily. For heartburn 07/28/23   [name], MD  rOPINIRole (REQUIP) 1 MG tablet Take 1 tablet (1 mg total) by mouth 3 (three) times daily. Plus an extra tablet at bedtime to total four a day 12/16/22   [name], MD  rosuvastatin (CRESTOR) 5 MG tablet Take 1 tablet (5 mg total) by mouth daily. 07/28/23   [name], MD  sildenafil (REVATIO) 20 MG tablet Take 2-5 pills at once, orally, with each sexual encounter 09/10/21   [name], MD    Physical Exam: Vitals:   09/05/23 0810 09/05/23 0820  BP:  (!) 148/71  Pulse:  76  Resp:  20  Temp:  98.2 F (36.8 C)  TempSrc:  Oral  SpO2:  95%  Weight: 92.5 kg   Height: 6' (1.829 m)    GENERAL:  A&O x 3, NAD, well developed, cooperative, follows commands HEENT: [place]/AT, No thrush, No icterus, No oral ulcers Neck:  No neck mass, No meningismus, soft, supple CV: RRR, no S3, no S4, no rub, no JVD Lungs: Diminished breath sounds.  Bilateral crackles.  Minimal bibasilar wheeze. Abd: soft/NT +BS, nondistended Ext: 2+ lower extremity edema, no lymphangitis, no  cyanosis, no rashes Neuro:  CN II-XII intact, strength 4+/5 in RUE, RLE, strength 4-/5 LUE, LLE; sensation intact bilateral; no dysmetria; babinski equivocal  Data Reviewed: Data reviewed above in history  Assessment and Plan: Acute respiratory failure with hypoxia -VBG--7.4 3/44/35/29 -Secondary to pneumonia and pulmonary edema -Stable on 4 L nasal cannula -Wean oxygen for saturation greater 92%  Acute HFpEF -08/07/2023 echo--EF 50-55%, no WMA, normal diastolic function, normal RV function, trace MR/TR, mild AI, no AS.  -Start IV furosemide 40 mg IV daily -Repeat limited echo -Accurate I's and O's -Daily weights -ReDS reading  Lobar pneumonia -Check PCT -Personally reviewed chest x-ray--right lower lobe opacity -Continue ceftriaxone and azithromycin -Check MRSA  COPD -Start bronchodilators -Patient has about  20-pack-year history of tobacco, quit smoking 20 years ago  Essential hypertension -Continue metoprolol succinate -Discontinue hydrochlorothiazide -Holding enalapril for now due to renal insufficiency  CKD stage IIIa -Baseline creatinine 1.0-1.4 -Monitor with diuresis  History of stroke -Continue aspirin and statin  Mixed hyperlipidemia -Continue statin -LDL 70 07/2023       Advance Care Planning: FULL  Consults: none  Family Communication: daughter updated 10/19  Severity of Illness: The appropriate patient status for this patient is INPATIENT. Inpatient status is judged to be reasonable and necessary in order to provide the required intensity of service to ensure the patient's safety. The patient's presenting symptoms, physical exam findings, and initial radiographic and laboratory data in the context of their chronic comorbidities is felt to place them at high risk for further clinical deterioration. Furthermore, it is not anticipated that the patient will be medically stable for discharge from the hospital within 2 midnights of admission.   * I  certify that at the point of admission it is my clinical judgment that the patient will require inpatient hospital care spanning beyond 2 midnights from the point of admission due to high intensity of service, high risk for further deterioration and high frequency of surveillance required.*  Author: Catarina Hartshorn, MD 09/05/2023 10:57 AM  For on call review [email or web address].

## 2023-09-05 NOTE — Hospital Course (Signed)
82 year old male with a history of prostate cancer, hypertension, hyperlipidemia, prediabetes, stroke with right hemiparesis, GERD presenting with 2-day history of shortness of breath.  The patient has had some associated coughing with white-yellow sputum.  He is complaining of some generalized weakness and myalgias.  He denies any chest pain, nausea, vomiting, direct abdominal pain, dysuria, hematuria, headache, neck pain. The patient has noted some increasing lower extremity edema over the past week.  He has had orthopnea type symptoms.  He has had to sleep in a recliner for the past 2 weeks. Notably, the patient was recently admitted to the hospital from 08/07/2023 to 08/14/2023.  He was transferred to [facility] from [facility] for right-sided weakness and aphasia.  The patient was found to have an acute left frontal lobe infarct that was felt to be cryptogenic.  CTA of the head and neck was negative for LVO.  Echocardiogram showed EF 50-55%, no WMA, normal diastolic function, normal RV function, trace MR/TR, mild AI, no AS.  He was discharged to inpatient rehab where he stayed from 08/14/2023 to 08/24/2023.  He was discharged with aspirin 81 mg daily and Lipitor 40 mg daily for secondary prophylaxis.  He has since returned home to live with his daughter and spouse. In the ED, the patient was afebrile and hemodynamically stable.  Oxygen saturation was in the 70s on room air.  On 4 L nasal cannula oxygen saturation is 95%.  WBC 7.5, hemoglobin 12.9, platelets 173.  Sodium 139, potassium 4.1, bicarbonate 27, serum creatinine 1.05.  LFTs unremarkable.  COVID-19 PCR is negative.  EKG shows sinus rhythm with nonspecific T wave changes.  VBG showed 7.4 3/44/35/29.  Chest x-ray showed increased interstitial markings, right lower lobe infiltrate.  The patient was started on ceftriaxone, azithromycin, and given furosemide 40 mg IV x 1.

## 2023-09-06 DIAGNOSIS — I5031 Acute diastolic (congestive) heart failure: Secondary | ICD-10-CM | POA: Diagnosis not present

## 2023-09-06 DIAGNOSIS — I10 Essential (primary) hypertension: Secondary | ICD-10-CM | POA: Diagnosis not present

## 2023-09-06 DIAGNOSIS — J9601 Acute respiratory failure with hypoxia: Secondary | ICD-10-CM | POA: Diagnosis not present

## 2023-09-06 LAB — BASIC METABOLIC PANEL
Anion gap: 8 (ref 5–15)
BUN: 19 mg/dL (ref 8–23)
CO2: 28 mmol/L (ref 22–32)
Calcium: 9.2 mg/dL (ref 8.9–10.3)
Chloride: 101 mmol/L (ref 98–111)
Creatinine, Ser: 1.08 mg/dL (ref 0.61–1.24)
GFR, Estimated: >60 mL/min (ref >60)
Glucose, Bld: 88 mg/dL (ref 70–99)
Potassium: 3.4 mmol/L — ABNORMAL LOW (ref 3.5–5.1)
Sodium: 137 mmol/L (ref 135–145)

## 2023-09-06 LAB — RESPIRATORY PANEL BY PCR

## 2023-09-06 LAB — CBC
HCT: 40 % (ref 39.0–52.0)
Hemoglobin: 12.5 g/dL — ABNORMAL LOW (ref 13.0–17.0)
MCH: 29.9 pg (ref 26.0–34.0)
MCHC: 31.3 g/dL (ref 30.0–36.0)
MCV: 95.7 fL (ref 80.0–100.0)
Platelets: 166 10*3/uL (ref 150–400)
RBC: 4.18 MIL/uL — ABNORMAL LOW (ref 4.22–5.81)
RDW: 14.3 % (ref 11.5–15.5)
WBC: 7 10*3/uL (ref 4.0–10.5)
nRBC: 0 % (ref 0.0–0.2)

## 2023-09-06 LAB — MAGNESIUM: Magnesium: 1.7 mg/dL (ref 1.7–2.4)

## 2023-09-06 MED ORDER — POTASSIUM CHLORIDE CRYS ER 20 MEQ PO TBCR
40.0000 meq | EXTENDED_RELEASE_TABLET | Freq: Once | ORAL | Status: AC
  Administered 2023-09-06: 40 meq via ORAL
  Filled 2023-09-06: qty 2

## 2023-09-06 MED ORDER — DM-GUAIFENESIN ER 30-600 MG PO TB12
1.0000 | ORAL_TABLET | Freq: Two times a day (BID) | ORAL | Status: DC
  Administered 2023-09-06 – 2023-09-08 (×4): 1 via ORAL
  Filled 2023-09-06 (×4): qty 1

## 2023-09-06 MED ORDER — MAGNESIUM SULFATE 2 GM/50ML IV SOLN
2.0000 g | Freq: Once | INTRAVENOUS | Status: AC
  Administered 2023-09-06: 2 g via INTRAVENOUS
  Filled 2023-09-06: qty 50

## 2023-09-06 NOTE — Progress Notes (Signed)
PROGRESS NOTE  Joshua Compton. ZOX:[id number] DOB: 02/28/1941 DOA: 09/05/2023 PCP: [name], MD  Brief History:  82 year old male with a history of prostate cancer, hypertension, hyperlipidemia, prediabetes, stroke with right hemiparesis, GERD presenting with 2-day history of shortness of breath.  The patient has had some associated coughing with white-yellow sputum.  He is complaining of some generalized weakness and myalgias.  He denies any chest pain, nausea, vomiting, direct abdominal pain, dysuria, hematuria, headache, neck pain. The patient has noted some increasing lower extremity edema over the past week.  He has had orthopnea type symptoms.  He has had to sleep in a recliner for the past 2 weeks. Notably, the patient was recently admitted to the hospital from 08/07/2023 to 08/14/2023.  He was transferred to [facility] from [facility] for right-sided weakness and aphasia.  The patient was found to have an acute left frontal lobe infarct that was felt to be cryptogenic.  CTA of the head and neck was negative for LVO.  Echocardiogram showed EF 50-55%, no WMA, normal diastolic function, normal RV function, trace MR/TR, mild AI, no AS.  He was discharged to inpatient rehab where he stayed from 08/14/2023 to 08/24/2023.  He was discharged with aspirin 81 mg daily and Lipitor 40 mg daily for secondary prophylaxis.  He has since returned home to live with his daughter and spouse. In the ED, the patient was afebrile and hemodynamically stable.  Oxygen saturation was in the 70s on room air.  On 4 L nasal cannula oxygen saturation is 95%.  WBC 7.5, hemoglobin 12.9, platelets 173.  Sodium 139, potassium 4.1, bicarbonate 27, serum creatinine 1.05.  LFTs unremarkable.  COVID-19 PCR is negative.  EKG shows sinus rhythm with nonspecific T wave changes.  VBG showed 7.4 3/44/35/29.  Chest x-ray showed increased interstitial markings, right lower lobe infiltrate.  The patient was started on ceftriaxone,  azithromycin, and given furosemide 20 mg IV x 1.   Assessment/Plan:  Acute respiratory failure with hypoxia -VBG--7.43/44/35/29 -Secondary to pneumonia and pulmonary edema -Stable on 4 L nasal cannula>>2L -Wean oxygen for saturation greater 92%   Acute HFpEF -08/07/2023 echo--EF 50-55%, no WMA, normal diastolic function, normal RV function, trace MR/TR, mild AI, no AS.  -continue IV furosemide 40 mg IV daily -Repeat limited echo -Accurate I's and O's--imcomplete -Daily weights 205>>198 -ReDS reading--26   Lobar pneumonia/hemoptysis -small volume hemoptysis -Check PCT<0.10 -Personally reviewed chest x-ray--right lower lobe opacity -Continue ceftriaxone and azithromycin -Check MRSA--neg -10/19 CTA chest neg for PE; trace bilateral pleural eff R>L.  Subpleural nodular densities bilateral LL and mild GGO RUL -anti-tussive med   COPD -Start bronchodilators -Patient has about 20-pack-year history of tobacco, quit smoking 20 years ago   Essential hypertension -Continue metoprolol succinate -Discontinue hydrochlorothiazide -Holding enalapril for now due to renal insufficiency   CKD stage IIIa -Baseline creatinine 1.0-1.4 -Monitor with diuresis   History of stroke -Continue aspirin and statin   Mixed hyperlipidemia -Continue statin -LDL 70 07/2023  Hypokalemia -replete -mag 1.7        Family Communication:   daughters updated at bedside 10/20  Consultants:  none  Code Status:  FULL   DVT Prophylaxis:  SSC Lovenox   Procedures: As Listed in Progress Note Above  Antibiotics: None      Subjective: Pt denies f/c, cp, n/v/d.  Breathing better but still has some dyspnea on exertion.  Objective: Vitals:   09/05/23 2002 09/05/23 2326 09/06/23 0441 09/06/23  0500  BP: (!) 160/69 (!) 142/52 132/64   Pulse: 68 66 71   Resp: 20 20 20    Temp: 98.1 F (36.7 C) (!) 97.4 F (36.3 C) (!) 97.5 F (36.4 C)   TempSrc:      SpO2: 94% 93% 94%   Weight:    89.8  kg  Height:        Intake/Output Summary (Last 24 hours) at 09/06/2023 1813 Last data filed at 09/06/2023 1600 Gross per 24 hour  Intake 586.81 ml  Output --  Net 586.81 ml   Weight change:  Exam:  General:  Pt is alert, follows commands appropriately, not in acute distress HEENT: No icterus, No thrush, No neck mass, [place]/AT Cardiovascular: RRR, S1/S2, no rubs, no gallops Respiratory: bibasilar rales. No wheeze Abdomen: Soft/+BS, non tender, non distended, no guarding Extremities: 1 + LE edema, No lymphangitis, No petechiae, No rashes, no synovitis   Data Reviewed: I have personally reviewed following labs and imaging studies Basic Metabolic Panel: Recent Labs  Lab 09/05/23 0911 09/06/23 0440  NA 139 137  K 4.1 3.4*  CL 105 101  CO2 27 28  GLUCOSE 103* 88  BUN 19 19  CREATININE 1.05 1.08  CALCIUM 9.2 9.2  MG  --  1.7   Liver Function Tests: Recent Labs  Lab 09/05/23 0911  AST 19  ALT 23  ALKPHOS 75  BILITOT 1.3*  PROT 6.8  ALBUMIN 3.7   No results for input(s): "LIPASE", "AMYLASE" in the last 168 hours. No results for input(s): "AMMONIA" in the last 168 hours. Coagulation Profile: No results for input(s): "INR", "PROTIME" in the last 168 hours. CBC: Recent Labs  Lab 09/05/23 0911 09/06/23 0440  WBC 7.5 7.0  NEUTROABS 5.2  --   HGB 12.9* 12.5*  HCT 39.7 40.0  MCV 95.9 95.7  PLT 173 166   Cardiac Enzymes: No results for input(s): "CKTOTAL", "CKMB", "CKMBINDEX", "TROPONINI" in the last 168 hours. BNP: Invalid input(s): "POCBNP" CBG: No results for input(s): "GLUCAP" in the last 168 hours. HbA1C: No results for input(s): "HGBA1C" in the last 72 hours. Urine analysis:    Component Value Date/Time   COLORURINE STRAW (A) 01/03/2021 1023   APPEARANCEUR CLEAR 01/03/2021 1023   APPEARANCEUR Clear 11/11/2017 1445   LABSPEC 1.008 01/03/2021 1023   PHURINE 6.0 01/03/2021 1023   GLUCOSEU NEGATIVE 01/03/2021 1023   HGBUR MODERATE (A) 01/03/2021 1023    BILIRUBINUR NEGATIVE 01/03/2021 1023   BILIRUBINUR Negative 11/11/2017 1445   KETONESUR NEGATIVE 01/03/2021 1023   PROTEINUR NEGATIVE 01/03/2021 1023   UROBILINOGEN 0.2 04/29/2013 0646   NITRITE NEGATIVE 01/03/2021 1023   LEUKOCYTESUR NEGATIVE 01/03/2021 1023   Sepsis Labs: @LABRCNTIP (procalcitonin:4,lacticidven:4) ) Recent Results (from the past 240 hour(s))  Resp panel by RT-PCR (RSV, Flu A&B, Covid) Anterior Nasal Swab     Status: None   Collection Time: 09/05/23  8:25 AM   Specimen: Anterior Nasal Swab  Result Value Ref Range Status   SARS Coronavirus 2 by RT PCR NEGATIVE NEGATIVE Final    Comment: (NOTE) SARS-CoV-2 target nucleic acids are NOT DETECTED.  The SARS-CoV-2 RNA is generally detectable in upper respiratory specimens during the acute phase of infection. The lowest concentration of SARS-CoV-2 viral copies this assay can detect is 138 copies/mL. A negative result does not preclude SARS-Cov-2 infection and should not be used as the sole basis for treatment or other patient management decisions. A negative result may occur with  improper specimen collection/handling, submission of specimen other  than nasopharyngeal swab, presence of viral mutation(s) within the areas targeted by this assay, and inadequate number of viral copies(<138 copies/mL). A negative result must be combined with clinical observations, patient history, and epidemiological information. The expected result is Negative.  Fact Sheet for Patients:  BloggerCourse.com  Fact Sheet for Healthcare Providers:  SeriousBroker.it  This test is no t yet approved or cleared by the [place] FDA and  has been authorized for detection and/or diagnosis of SARS-CoV-2 by FDA under an Emergency Use Authorization (EUA). This EUA will remain  in effect (meaning this test can be used) for the duration of the COVID-19 declaration under Section 564(b)(1) of the  Act, 21 U.S.C.section 360bbb-3(b)(1), unless the authorization is terminated  or revoked sooner.       Influenza A by PCR NEGATIVE NEGATIVE Final   Influenza B by PCR NEGATIVE NEGATIVE Final    Comment: (NOTE) The Xpert Xpress SARS-CoV-2/FLU/RSV plus assay is intended as an aid in the diagnosis of influenza from Nasopharyngeal swab specimens and should not be used as a sole basis for treatment. Nasal washings and aspirates are unacceptable for Xpert Xpress SARS-CoV-2/FLU/RSV testing.  Fact Sheet for Patients: BloggerCourse.com  Fact Sheet for Healthcare Providers: SeriousBroker.it  This test is not yet approved or cleared by the [place] FDA and has been authorized for detection and/or diagnosis of SARS-CoV-2 by FDA under an Emergency Use Authorization (EUA). This EUA will remain in effect (meaning this test can be used) for the duration of the COVID-19 declaration under Section 564(b)(1) of the Act, 21 U.S.C. section 360bbb-3(b)(1), unless the authorization is terminated or revoked.     Resp Syncytial Virus by PCR NEGATIVE NEGATIVE Final    Comment: (NOTE) Fact Sheet for Patients: BloggerCourse.com  Fact Sheet for Healthcare Providers: SeriousBroker.it  This test is not yet approved or cleared by the [place] FDA and has been authorized for detection and/or diagnosis of SARS-CoV-2 by FDA under an Emergency Use Authorization (EUA). This EUA will remain in effect (meaning this test can be used) for the duration of the COVID-19 declaration under Section 564(b)(1) of the Act, 21 U.S.C. section 360bbb-3(b)(1), unless the authorization is terminated or revoked.  Performed at [facility], [address], [place], [place] [address]   MRSA Next Gen by PCR, Nasal     Status: None   Collection Time: 09/05/23  1:05 PM   Specimen: Nasal Mucosa; Nasal Swab  Result Value  Ref Range Status   MRSA by PCR Next Gen NOT DETECTED NOT DETECTED Final    Comment: (NOTE) The GeneXpert MRSA Assay (FDA approved for NASAL specimens only), is one component of a comprehensive MRSA colonization surveillance program. It is not intended to diagnose MRSA infection nor to guide or monitor treatment for MRSA infections. Test performance is not FDA approved in patients less than 53 years old. Performed at [facility], [address], [place], [place] [address]   Respiratory (~20 pathogens) panel by PCR     Status: None   Collection Time: 09/05/23  1:05 PM   Specimen: Nasopharyngeal Swab; Respiratory  Result Value Ref Range Status   Adenovirus NOT DETECTED NOT DETECTED Final   Coronavirus 229E NOT DETECTED NOT DETECTED Final    Comment: (NOTE) The Coronavirus on the Respiratory Panel, DOES NOT test for the novel  Coronavirus (2019 nCoV)    Coronavirus HKU1 NOT DETECTED NOT DETECTED Final   Coronavirus NL63 NOT DETECTED NOT DETECTED Final   Coronavirus OC43 NOT DETECTED NOT DETECTED  Final   Metapneumovirus NOT DETECTED NOT DETECTED Final   Rhinovirus / Enterovirus NOT DETECTED NOT DETECTED Final   Influenza A NOT DETECTED NOT DETECTED Final   Influenza B NOT DETECTED NOT DETECTED Final   Parainfluenza Virus 1 NOT DETECTED NOT DETECTED Final   Parainfluenza Virus 2 NOT DETECTED NOT DETECTED Final   Parainfluenza Virus 3 NOT DETECTED NOT DETECTED Final   Parainfluenza Virus 4 NOT DETECTED NOT DETECTED Final   Respiratory Syncytial Virus NOT DETECTED NOT DETECTED Final   Bordetella pertussis NOT DETECTED NOT DETECTED Final   Bordetella Parapertussis NOT DETECTED NOT DETECTED Final   Chlamydophila pneumoniae NOT DETECTED NOT DETECTED Final   Mycoplasma pneumoniae NOT DETECTED NOT DETECTED Final    Comment: Performed at [facility], [address], [place], [place] [address]     Scheduled Meds:  aspirin  81 mg Oral Daily   atorvastatin  40 mg Oral QHS    azithromycin  500 mg Oral Daily   dorzolamide-timolol  1 drop Both Eyes Daily   enoxaparin (LOVENOX) injection  40 mg Subcutaneous Q24H   fluticasone  2 spray Each Nare Daily   furosemide  40 mg Intravenous Daily   metoprolol succinate  50 mg Oral Daily   pantoprazole  40 mg Oral Daily   rOPINIRole  1 mg Oral TID   sodium chloride flush  10 mL Intravenous Q12H   sodium chloride flush  3 mL Intravenous Q12H   Continuous Infusions:  cefTRIAXone (ROCEPHIN)  IV 1 g (09/06/23 0931)    Procedures/Studies: CT Angio Chest Pulmonary Embolism (PE) W or WO Contrast  Result Date: 09/05/2023 CLINICAL DATA:  Positive D-dimer and hemoptysis, initial encounter EXAM: CT ANGIOGRAPHY CHEST WITH CONTRAST TECHNIQUE: Multidetector CT imaging of the chest was performed using the standard protocol during bolus administration of intravenous contrast. Multiplanar CT image reconstructions and MIPs were obtained to evaluate the vascular anatomy. RADIATION DOSE REDUCTION: This exam was performed according to the departmental dose-optimization program which includes automated exposure control, adjustment of the mA and/or kV according to patient size and/or use of iterative reconstruction technique. CONTRAST:  75mL OMNIPAQUE IOHEXOL 350 MG/ML SOLN COMPARISON:  Noncontrast CT from earlier in the same day. FINDINGS: Cardiovascular: Atherosclerotic calcifications of the aorta are noted. No aneurysmal dilatation is seen. The degree of opacification of the aorta is limited precluding evaluation for dissection. The pulmonary artery shows a normal branching pattern bilaterally. No intraluminal filling defect to suggest pulmonary embolism is seen. Coronary calcifications are noted. No cardiac enlargement is noted. Mediastinum/Nodes: Thoracic inlet again shows diffuse enlargement of the right lobe of the thyroid. This has been previously evaluated on ultrasound as well as biopsy. Correlate with those results. No hilar or mediastinal  adenopathy is noted. The esophagus as visualized is within normal limits. Lungs/Pleura: Lungs are well aerated bilaterally. Diffuse emphysematous changes are seen. Small bilateral pleural effusions are noted. Some stable fibrotic changes are noted in the posterior right upper lobe. No new focal infiltrate is seen. The previously noted lower lobe nodular density on the left is again identified and stable. The nodular density on the right has resolved likely related to improved aeration. Upper Abdomen: Visualized upper abdomen shows renal cysts on the left similar to that noted on the prior CT examination. No further follow-up is recommended. Musculoskeletal: No acute rib abnormality is noted. Degenerative changes of the thoracic spine are seen. Review of the MIP images confirms the above findings. IMPRESSION: No evidence of pulmonary emboli. Overall  stable appearance of the lungs bilaterally with the exception of the previously mentioned right lower lobe nodule. This has resolved secondary to improved aeration. Small effusions are noted bilaterally. Aortic Atherosclerosis (ICD10-I70.0) and Emphysema (ICD10-J43.9). Electronically Signed   By: Alcide Clever M.D.   On: 09/05/2023 19:53   CT CHEST WO CONTRAST  Result Date: 09/05/2023 CLINICAL DATA:  Chronic dyspnea EXAM: CT CHEST WITHOUT CONTRAST TECHNIQUE: Multidetector CT imaging of the chest was performed following the standard protocol without IV contrast. RADIATION DOSE REDUCTION: This exam was performed according to the departmental dose-optimization program which includes automated exposure control, adjustment of the mA and/or kV according to patient size and/or use of iterative reconstruction technique. COMPARISON:  Right thyroid ultrasound dated June 18, 2017; chest CTA dated January 09, 2011 FINDINGS: Cardiovascular: Cardiomegaly. No pericardial effusion. Normal caliber thoracic aorta with severe atherosclerotic disease. Severe left main and three-vessel  coronary artery calcifications. Mediastinum/Nodes: Small hiatal hernia. Enlarged and heterogeneous right thyroid lobe, correlate with previously performed thyroid ultrasound. No enlarged lymph nodes seen in the chest. Lungs/Pleura: Central airways are patent. Severe centrilobular emphysema. Subpleural nodular opacities of the bilateral lower lobes measuring 16 x 10 mm in the right lower lobe on series 3, image 22 and 10 x 14 mm on image 100 in the left lower lobe. Respiratory motion artifact somewhat limits evaluation of the lung parenchyma. Mild ground-glass opacities of the posterior right upper lobe. Trace right-greater-than-left pleural effusions and atelectasis. Upper Abdomen: Simple appearing cystic lesions of the left kidney, no specific follow-up imaging is necessary. Musculoskeletal: No chest wall mass or suspicious bone lesions identified. IMPRESSION: 1. Mild ground-glass opacities of the posterior right upper lobe, likely infectious or inflammatory. 2. Trace right-greater-than-left pleural effusions and atelectasis. 3. New subpleural nodular opacities of the bilateral lower lobes measuring up to 16 mm in the right lower lobe and 14 mm in the left lower lobe possibly due to focal scarring or atelectasis, although pulmonary nodules can not be excluded. Recommend follow-up chest CT in 3 months to ensure resolution. 4. Cardiomegaly with severe left main and three-vessel coronary artery calcifications 5. Severe aortic Atherosclerosis (ICD10-I70.0) and Emphysema (ICD10-J43.9). Electronically Signed   By: Allegra Lai M.D.   On: 09/05/2023 16:21   DG Chest Port 1 View  Result Date: 09/05/2023 CLINICAL DATA:  Shortness of breath since yesterday EXAM: PORTABLE CHEST 1 VIEW COMPARISON:  04/30/2023 FINDINGS: Interstitial coarsening not seen previously. There is asymmetric density in the right lower lung. Emphysema by prior CT. Normal heart size and mediastinal contours. Stable aortic tortuosity. IMPRESSION:  1. Infiltrate in the right lung suggesting pneumonia. Followup PA and lateral chest X-ray is recommended in 3-4 weeks following trial of antibiotic therapy to ensure resolution. 2. Generalized interstitial coarsening which may be underlying bronchitis. 3. Emphysema. Electronically Signed   By: Tiburcio Pea M.D.   On: 09/05/2023 08:47    Catarina Hartshorn, DO  [facility]  If 7PM-7AM, please contact night-coverage [email or web address] Password [email or web address] 09/06/2023, 6:13 PM   LOS: 1 day

## 2023-09-06 NOTE — Evaluation (Signed)
Physical Therapy Evaluation Patient Details Name: Joshua Compton. MRN: [id number] DOB: Nov 17, 1941 Today's Date: 09/06/2023  History of Present Illness  Joshua Compton. is a 82 year old male with a history of prostate cancer, hypertension, hyperlipidemia, prediabetes, stroke with right hemiparesis, GERD presenting with 2-day history of shortness of breath.  The patient has had some associated coughing with white-yellow sputum.  He is complaining of some generalized weakness and myalgias.  He denies any chest pain, nausea, vomiting, direct abdominal pain, dysuria, hematuria, headache, neck pain.  The patient has noted some increasing lower extremity edema over the past week.  He has had orthopnea type symptoms.  He has had to sleep in a recliner for the past 2 weeks.  Notably, the patient was recently admitted to the hospital from 08/07/2023 to 08/14/2023.  He was transferred to [facility] from [facility] for right-sided weakness and aphasia.  The patient was found to have an acute left frontal lobe infarct that was felt to be cryptogenic  Clinical Impression  PT LE strength is functional, increased edema and strength of Lt UE.  Pt mobility limitation at this time is respiratory.  Possible speech/swallow study for silent aspiration.    Pt would benefit from an OT evaluation.       If plan is discharge home, recommend the following: Help with stairs or ramp for entrance;Assistance with cooking/housework;A little help with bathing/dressing/bathroom   Can travel by private vehicle    yes    Equipment Recommendations None recommended by PT  Recommendations for Other Services  OT consult    Functional Status Assessment Patient has had a recent decline in their functional status and demonstrates the ability to make significant improvements in function in a reasonable and predictable amount of time.     Precautions / Restrictions Precautions Precautions: None Restrictions Weight Bearing  Restrictions: No      Mobility  Bed Mobility Overal bed mobility: Independent                  Transfers Overall transfer level: Independent                 General transfer comment: O2 with oxygen 92 ; sitting on the side of the bed without oxygen O2 drops to 88.    Ambulation/Gait Ambulation/Gait assistance: Modified independent (Device/Increase time) Gait Distance (Feet): 5 Feet (limited due to O2 not LE strength or balance) Assistive device: Rolling walker (2 wheels), Straight cane Gait Pattern/deviations: Decreased step length - right, Decreased step length - left Gait velocity: no significant distance completed due to SOB   Pre-gait activities: LAQ, sitting marching, General Gait Details: PT states he has been out in the garden and walking just fine without AD, Rt arm and breathing is his problem        Pertinent Vitals/Pain Pain Assessment Pain Assessment: No/denies pain    Home Living Family/patient expects to be discharged to:: Private residence Living Arrangements: Spouse/significant other Available Help at Discharge: Family Type of Home: House Home Access: Stairs to enter Entrance Stairs-Rails: Right;Left;Can reach both Entrance Stairs-Number of Steps: 3   Home Layout: One level Home Equipment: Agricultural consultant (2 wheels);Cane - single point      Prior Function Prior Level of Function : Independent/Modified Independent                     Extremity/Trunk Assessment   Upper Extremity Assessment Upper Extremity Assessment: Defer to OT evaluation (weakness and edema in Rt hand shown  self manual techniques to decrease edema and arm exercises.  Pt already completing at home.)    Lower Extremity Assessment Lower Extremity Assessment: Overall WFL for tasks assessed       Communication   Communication Communication: No apparent difficulties  Cognition Arousal: Alert   Overall Cognitive Status: Within Functional Limits for tasks  assessed                                                 Assessment/Plan    PT Assessment Patient needs continued PT services  PT Problem List Decreased activity tolerance       PT Treatment Interventions Balance training    PT Goals (Current goals can be found in the Care Plan section)       Frequency Min 2X/week        AM-PAC PT "6 Clicks" Mobility  Outcome Measure Help needed turning from your back to your side while in a flat bed without using bedrails?: None Help needed moving from lying on your back to sitting on the side of a flat bed without using bedrails?: None Help needed moving to and from a bed to a chair (including a wheelchair)?: None Help needed standing up from a chair using your arms (e.g., wheelchair or bedside chair)?: None Help needed to walk in hospital room?: A Little Help needed climbing 3-5 steps with a railing? : A Little 6 Click Score: 22    End of Session Equipment Utilized During Treatment: Oxygen Activity Tolerance: Patient tolerated treatment well Patient left: in chair;with call bell/phone within reach Nurse Communication: Mobility status PT Visit Diagnosis: Unsteadiness on feet (R26.81)    Time: 1020-1049 PT Time Calculation (min) (ACUTE ONLY): 29 min   Charges:   PT Evaluation $PT Eval Low Complexity: 1 Low PT Treatments $Therapeutic Activity: 8-22 mins PT General Charges $$ ACUTE PT VISIT: 1 Visit       Virgina Organ, PT CLT [phone]  09/06/2023, 10:49 AM

## 2023-09-06 NOTE — Progress Notes (Signed)
   09/06/23 0900  ReDS Vest / Clip  Station Marker D  Ruler Value 44  ReDS Value Range < 36  ReDS Actual Value 26

## 2023-09-06 NOTE — Plan of Care (Signed)

## 2023-09-06 NOTE — TOC Initial Note (Addendum)
Transition of Care (TOC) - Initial/Assessment Note    Patient Details  Name: Joshua Compton. MRN: [id number] Date of Birth: 07-Feb-1941  Transition of Care [facility]) CM/SW Contact:    Joshua Compton, LCSWA Phone Number: 09/06/2023, 10:23 AM  Clinical Narrative:                 [facility] consulted for CHF screen. CSW spoke with pt to complete assessment. Pt states he lives with his wife and a few of their grandchildren. Pt states that his daughter provides transportation to appointments when needed. Pt sates that he had HH in the past but not recently. Pt states that he does not use any DME. TOC to follow.   Addendum: CSW updated that PT is recommending outpatient therapies for pt. CSW spoke with pt who states a therapist is supposed to be coming to his home next week to start PT. He stated to call his daughter for what agency it would be. CSW spoke with pts daughter Joshua Compton who states pt is going to be seen in the home by [facility]. Pt will need new HH orders prior to D/C. TOC to follow.   Expected Discharge Plan: Home/Self Care Barriers to Discharge: Continued Medical Work up   Patient Goals and CMS Choice Patient states their goals for this hospitalization and ongoing recovery are:: return home CMS Medicare.gov Compare Post Acute Care list provided to:: Patient Choice offered to / list presented to : Patient      Expected Discharge Plan and Services In-house Referral: Clinical Social Work Discharge Planning Services: CM Consult   Living arrangements for the past 2 months: Single Family Home                                      Prior Living Arrangements/Services Living arrangements for the past 2 months: Single Family Home Lives with:: Spouse Patient language and need for interpreter reviewed:: Yes Do you feel safe going back to the place where you live?: Yes      Need for Family Participation in Patient Care: Yes (Comment) Care giver support system in place?: Yes  (comment)   Criminal Activity/Legal Involvement Pertinent to Current Situation/Hospitalization: No - Comment as needed  Activities of Daily Living   ADL Screening (condition at time of admission) Independently performs ADLs?: Yes (appropriate for developmental age) Does the patient have a NEW difficulty with bathing/dressing/toileting/self-feeding that is expected to last >3 days?: No Does the patient have a NEW difficulty with getting in/out of bed, walking, or climbing stairs that is expected to last >3 days?: No Does the patient have a NEW difficulty with communication that is expected to last >3 days?: No Is the patient deaf or have difficulty hearing?: No Does the patient have difficulty seeing, even when wearing glasses/contacts?: No Does the patient have difficulty concentrating, remembering, or making decisions?: No  Permission Sought/Granted                  Emotional Assessment Appearance:: Appears stated age Attitude/Demeanor/Rapport: Engaged Affect (typically observed): Accepting Orientation: : Oriented to Self, Oriented to Place, Oriented to  Time, Oriented to Situation Alcohol / Substance Use: Not Applicable Psych Involvement: No (comment)  Admission diagnosis:  Respiratory distress [R06.03] Acute respiratory failure with hypoxia (HCC) [J96.01] Community acquired pneumonia, unspecified laterality [J18.9] Patient Active Problem List   Diagnosis Date Noted   Acute respiratory failure with hypoxia (HCC)  09/05/2023   Acute heart failure with preserved ejection fraction (HFpEF) (HCC) 09/05/2023   Lobar pneumonia (HCC) 09/05/2023   Paresthesia of both feet 07/28/2023   Gastroesophageal reflux disease with esophagitis without hemorrhage 09/10/2021   Primary insomnia 09/10/2021   Vasculogenic erectile dysfunction 09/10/2021   Status post total replacement of left hip 01/07/2021   Primary osteoarthritis of left hip 01/06/2021   Leg cramps 12/11/2020   Thyromegaly  05/19/2017   Neoplasm of scalp 12/08/2013   Rhinitis 12/08/2013   Cough 12/08/2013   Hyperlipidemia    Cancer (HCC)    Arthritis    Severe stage glaucoma 03/26/2012   Nuclear sclerosis 03/26/2012   Primary open angle glaucoma 10/27/2011   Essential hypertension 02/04/2011   Degenerative disc disease 02/04/2011   Gastrointestinal bleed 02/04/2011   Anemia 02/04/2011   Prostate cancer (HCC) 02/04/2011   PCP:  [name], MD Pharmacy:   [facility] [phone] - [place], [place] - [address] [place] [address] Phone: [phone] Fax: [phone]     Social Determinants of Health (SDOH) Social History: SDOH Screenings   Food Insecurity: No Food Insecurity (09/05/2023)  Housing: Low Risk  (09/05/2023)  Transportation Needs: No Transportation Needs (09/05/2023)  Utilities: Not At Risk (09/05/2023)  Alcohol Screen: Low Risk  (01/14/2023)  Depression (PHQ2-9): Low Risk  (07/28/2023)  Financial Resource Strain: Low Risk  (01/14/2023)  Physical Activity: Sufficiently Active (01/14/2023)  Social Connections: Unknown (08/06/2023)   [facility] from [facility]  Stress: Patient Unable To Answer (08/07/2023)   Received from [facility]  Tobacco Use: Medium Risk (09/05/2023)   SDOH Interventions:     Readmission Risk Interventions    09/06/2023   10:18 AM  Readmission Risk Prevention Plan  Transportation Screening Complete  Home Care Screening Complete  Medication Review (RN CM) Complete

## 2023-09-06 NOTE — Plan of Care (Signed)
  Problem: Education: Goal: Knowledge of General Education information will improve Description Including pain rating scale, medication(s)/side effects and non-pharmacologic comfort measures Outcome: Progressing   

## 2023-09-07 ENCOUNTER — Inpatient Hospital Stay (HOSPITAL_COMMUNITY): Payer: Medicare HMO

## 2023-09-07 ENCOUNTER — Other Ambulatory Visit (HOSPITAL_COMMUNITY): Payer: Self-pay

## 2023-09-07 DIAGNOSIS — I4891 Unspecified atrial fibrillation: Secondary | ICD-10-CM | POA: Diagnosis not present

## 2023-09-07 DIAGNOSIS — J9601 Acute respiratory failure with hypoxia: Secondary | ICD-10-CM | POA: Diagnosis not present

## 2023-09-07 DIAGNOSIS — J181 Lobar pneumonia, unspecified organism: Secondary | ICD-10-CM | POA: Diagnosis not present

## 2023-09-07 DIAGNOSIS — I480 Paroxysmal atrial fibrillation: Secondary | ICD-10-CM | POA: Insufficient documentation

## 2023-09-07 DIAGNOSIS — I5031 Acute diastolic (congestive) heart failure: Secondary | ICD-10-CM | POA: Diagnosis not present

## 2023-09-07 LAB — ECHOCARDIOGRAM LIMITED
AV Mean grad: 4 mm[Hg]
AV Peak grad: 8.5 mm[Hg]
Ao pk vel: 1.46 m/s
Area-P 1/2: 3.21 cm2
Calc EF: 41.7 %
Height: 72 in
P 1/2 time: 616 ms
S' Lateral: 4.6 cm
Single Plane A2C EF: 34.5 %
Single Plane A4C EF: 48.4 %
Weight: 3160.51 [oz_av]

## 2023-09-07 LAB — BASIC METABOLIC PANEL
Anion gap: 8 (ref 5–15)
BUN: 24 mg/dL — ABNORMAL HIGH (ref 8–23)
CO2: 28 mmol/L (ref 22–32)
Calcium: 9 mg/dL (ref 8.9–10.3)
Chloride: 101 mmol/L (ref 98–111)
Creatinine, Ser: 1.03 mg/dL (ref 0.61–1.24)
GFR, Estimated: >60 mL/min (ref >60)
Glucose, Bld: 90 mg/dL (ref 70–99)
Potassium: 3.8 mmol/L (ref 3.5–5.1)
Sodium: 137 mmol/L (ref 135–145)

## 2023-09-07 LAB — HEPATIC FUNCTION PANEL
ALT: 21 U/L (ref 0–44)
AST: 18 U/L (ref 15–41)
Albumin: 3.4 g/dL — ABNORMAL LOW (ref 3.5–5.0)
Alkaline Phosphatase: 68 U/L (ref 38–126)
Bilirubin, Direct: 0.2 mg/dL (ref 0.0–0.2)
Indirect Bilirubin: 0.8 mg/dL (ref 0.3–0.9)
Total Bilirubin: 1 mg/dL (ref 0.3–1.2)
Total Protein: 6.8 g/dL (ref 6.5–8.1)

## 2023-09-07 LAB — MAGNESIUM: Magnesium: 2.2 mg/dL (ref 1.7–2.4)

## 2023-09-07 MED ORDER — PERFLUTREN LIPID MICROSPHERE
1.0000 mL | INTRAVENOUS | Status: AC | PRN
Start: 2023-09-07 — End: 2023-09-07
  Administered 2023-09-07: 2 mL via INTRAVENOUS

## 2023-09-07 MED ORDER — AMIODARONE HCL 200 MG PO TABS
200.0000 mg | ORAL_TABLET | Freq: Two times a day (BID) | ORAL | Status: DC
Start: 2023-09-07 — End: 2023-09-08
  Administered 2023-09-07 – 2023-09-08 (×3): 200 mg via ORAL
  Filled 2023-09-07 (×3): qty 1

## 2023-09-07 MED ORDER — DOXYCYCLINE HYCLATE 100 MG PO TABS
100.0000 mg | ORAL_TABLET | Freq: Two times a day (BID) | ORAL | Status: DC
  Administered 2023-09-08: 100 mg via ORAL
  Filled 2023-09-07: qty 1

## 2023-09-07 MED ORDER — HYDROCODONE BIT-HOMATROP MBR 5-1.5 MG/5ML PO SOLN: 5.0000 mL | ORAL | Status: DC | PRN

## 2023-09-07 MED ORDER — APIXABAN 5 MG PO TABS
5.0000 mg | ORAL_TABLET | Freq: Two times a day (BID) | ORAL | Status: DC
  Administered 2023-09-07 – 2023-09-08 (×3): 5 mg via ORAL
  Filled 2023-09-07 (×3): qty 1

## 2023-09-07 NOTE — Progress Notes (Signed)
ReDs Clip showed Low Quality times two nurses. Patient tolerated procedure. Plan of care on going.

## 2023-09-07 NOTE — Telephone Encounter (Signed)
Called daughter, patient is currently hospitalized

## 2023-09-07 NOTE — Plan of Care (Signed)
  Problem: Coping: Goal: Level of anxiety will decrease Outcome: Progressing   Problem: Pain Managment: Goal: General experience of comfort will improve Outcome: Progressing   

## 2023-09-07 NOTE — Telephone Encounter (Signed)
Avoid strenuous activity and we will work on it in the office.

## 2023-09-07 NOTE — Consult Note (Signed)
Cardiology Consultation   Patient ID: Joshua Compton. MRN: [id number]; DOB: [birth year]  Admit date: 09/05/2023 Date of Consult: 09/07/2023  PCP:  [name], MD   [facility] Providers Cardiologist:  None        Patient Profile:   Joshua Compton. is a 82 y.o. male with a hx of  with a history of prostate cancer, hypertension, hyperlipidemia, prediabetes, stroke with right hemiparesis, GERD who is being seen 09/07/2023 for the evaluation of CHF & new Afib at the request of Dr. Arbutus Leas.  History of Present Illness:   Joshua Compton with above PMHx. He patient was recently admitted to the hospital from 08/07/2023 to 08/14/2023. He was transferred to [facility] from [facility] for right-sided weakness and aphasia. The patient was found to have an acute left frontal lobe infarct that was felt to be cryptogenic. CTA of the head and neck was negative for LVO. He was given IV TNK(tenecteplase)Echocardiogram showed EF 50-55%, no WMA, normal diastolic function, normal RV function, trace MR/TR, mild AI, no AS. He was discharged to inpatient rehab where he stayed from 08/14/2023 to 08/24/2023. He was discharged with aspirin 81 mg daily and Lipitor 40 mg daily for secondary prophylaxis. He has since returned home to live with his daughter and spouse. He came in with pneumonia and CHF. Limited echo ordered.  Patient still has some speech difficulty and right arm swelling since his stroke. Very active around house and garden prior to this. He became progressively SOB prompting admission. He has been in/out of AFib with rates up 120's but is asymptomatic with this. Didn't have at [facility].      Past Medical History:  Diagnosis Date   Arthritis    Cancer (HCC)    prostate ca   seed implants   GERD (gastroesophageal reflux disease)    Glaucoma    Hyperlipidemia    Hypertension    dr Ruben Im   fm    Past Surgical History:  Procedure Laterality Date   BACK SURGERY     x2    COLONOSCOPY     HERNIA REPAIR  11-06-2009   left inguinal repair    NECK SURGERY     ROTATOR CUFF REPAIR Left jan 2016   TOTAL HIP ARTHROPLASTY Right 04/20/2020   Procedure: RIGHT TOTAL HIP ARTHROPLASTY ANTERIOR APPROACH;  Surgeon: Tarry Kos, MD;  Location: [facility] OR;  Service: Orthopedics;  Laterality: Right;   TOTAL HIP ARTHROPLASTY Left 01/07/2021   Procedure: LEFT TOTAL HIP ARTHROPLASTY ANTERIOR APPROACH;  Surgeon: Tarry Kos, MD;  Location: [facility] OR;  Service: Orthopedics;  Laterality: Left;   TRANSURETHRAL RESECTION OF PROSTATE       Home Medications:  Prior to Admission medications   Medication Sig Start Date End Date Taking? Authorizing Provider  amLODipine (NORVASC) 10 MG tablet Take 10 mg by mouth daily.   Yes [provider]  aspirin 81 MG chewable tablet Chew 81 mg by mouth daily.   Yes [provider]  ergocalciferol (VITAMIN D2) 1.25 MG (50000 UT) capsule Take 50,000 Units by mouth once a week. Takes every Wednesday   Yes [provider]  lisinopril (ZESTRIL) 20 MG tablet Take 20 mg by mouth daily.   Yes [provider]  albuterol (VENTOLIN HFA) 108 (90 Base) MCG/ACT inhaler Inhale 1-2 puffs into the lungs every 6 (six) hours as needed for wheezing or shortness of breath. 04/06/23   Cristi Loron, Celeste A, PA-C  dorzolamide-timolol (  COSOPT) 22.3-6.8 MG/ML ophthalmic solution Place 1 drop into both eyes daily.  08/04/16   [provider]  fluticasone (FLONASE) 50 MCG/ACT nasal spray Place 2 sprays into both nostrils daily. 03/23/23   Daphine Deutscher, Mary-Margaret, FNP  metoprolol succinate (TOPROL-XL) 50 MG 24 hr tablet TAKE 1 TABLET (50 MG TOTAL) BY MOUTH DAILY. FOR BLOOD PRESSURE CONTROL 08/24/23   [name], MD  pantoprazole (PROTONIX) 40 MG tablet Take 1 tablet (40 mg total) by mouth daily. For heartburn 07/28/23   [name], MD  rOPINIRole (REQUIP) 1 MG tablet Take 1 tablet (1 mg total) by mouth 3 (three) times daily. Plus an extra tablet  at bedtime to total four a day 12/16/22   [name], MD    Inpatient Medications: Scheduled Meds:  aspirin  81 mg Oral Daily   atorvastatin  40 mg Oral QHS   azithromycin  500 mg Oral Daily   dextromethorphan-guaiFENesin  1 tablet Oral BID   dorzolamide-timolol  1 drop Both Eyes Daily   enoxaparin (LOVENOX) injection  40 mg Subcutaneous Q24H   fluticasone  2 spray Each Nare Daily   furosemide  40 mg Intravenous Daily   metoprolol succinate  50 mg Oral Daily   pantoprazole  40 mg Oral Daily   rOPINIRole  1 mg Oral TID   sodium chloride flush  10 mL Intravenous Q12H   sodium chloride flush  3 mL Intravenous Q12H   Continuous Infusions:  cefTRIAXone (ROCEPHIN)  IV 1 g (09/06/23 0931)   PRN Meds: acetaminophen, HYDROcodone bit-homatropine, ondansetron (ZOFRAN) IV, sodium chloride flush  Allergies:    Allergies  Allergen Reactions   Multihance [Gadobenate] Swelling    Tongue swelled up when he had MRI in [place]   Pravastatin Sodium     Muscle aches    Social History:   Social History   Socioeconomic History   Marital status: Married    Spouse name: Not on file   Number of children: 5   Years of education: 11   Highest education level: 11th grade  Occupational History   Occupation: Nutritional therapist    Comment: retired  Tobacco Use   Smoking status: Former    Current packs/day: 0.00    Average packs/day: 0.5 packs/day for 30.0 years (15.0 ttl pk-yrs)    Types: Cigarettes    Start date: 03/29/1975    Quit date: 03/28/2005    Years since quitting: 18.4   Smokeless tobacco: Never  Vaping Use   Vaping status: Never Used  Substance and Sexual Activity   Alcohol use: No    Alcohol/week: 0.0 standard drinks of alcohol   Drug use: No   Sexual activity: Yes  Other Topics Concern   Not on file  Social History Narrative   Grandson lives with them - no stairs   Social Determinants of Health   Financial Resource Strain: Low Risk  (01/14/2023)   Overall  Financial Resource Strain (CARDIA)    Difficulty of Paying Living Expenses: Not hard at all  Food Insecurity: No Food Insecurity (09/05/2023)   Hunger Vital Sign    Worried About Running Out of Food in the Last Year: Never true    Ran Out of Food in the Last Year: Never true  Transportation Needs: No Transportation Needs (09/05/2023)   PRAPARE - Administrator, Civil Service (Medical): No    Lack of Transportation (Non-Medical): No  Physical Activity: Sufficiently Active (01/14/2023)   Exercise Vital Sign    Days of  Exercise per Week: 5 days    Minutes of Exercise per Session: 30 min  Stress: Patient Unable To Answer (08/07/2023)   Received from [facility] - Occupational Stress Questionnaire    Feeling of Stress : Patient unable to answer  Social Connections: Unknown (08/06/2023)   [facility] from [facility]   Social Network    Social Network: Not on file  Intimate Partner Violence: Not At Risk (09/05/2023)   Humiliation, Afraid, Rape, and Kick questionnaire    Fear of Current or Ex-Partner: No    Emotionally Abused: No    Physically Abused: No    Sexually Abused: No    Family History:     Family History  Problem Relation Age of Onset   Cancer Mother        breast   Heart disease Father    Cancer Brother    Cancer Brother      ROS:  Please see the history of present illness.  Review of Systems  Constitutional: Negative.  HENT: Negative.    Cardiovascular:  Positive for dyspnea on exertion and leg swelling.  Respiratory:  Positive for cough and shortness of breath.   Endocrine: Negative.   Hematologic/Lymphatic: Negative.   Musculoskeletal: Negative.   Gastrointestinal: Negative.   Genitourinary: Negative.   Neurological:  Positive for focal weakness.    All other ROS reviewed and negative.     Physical Exam/Data:   Vitals:   09/06/23 2023 09/07/23 0425 09/07/23 0500 09/07/23 1006  BP: (!) 129/93 (!)  140/60  121/78  Pulse: (!) 101 60  90  Resp:    18  Temp: 97.9 F (36.6 C) 97.9 F (36.6 C)  98.2 F (36.8 C)  TempSrc: Oral Oral  Oral  SpO2: 95% 98%  98%  Weight:   89.6 kg   Height:        Intake/Output Summary (Last 24 hours) at 09/07/2023 1016 Last data filed at 09/06/2023 2130 Gross per 24 hour  Intake 822.64 ml  Output --  Net 822.64 ml      09/07/2023    5:00 AM 09/06/2023    5:00 AM 09/05/2023   11:30 AM  Last 3 Weights  Weight (lbs) 197 lb 8.5 oz 197 lb 15.6 oz 205 lb 14.6 oz  Weight (kg) 89.6 kg 89.8 kg 93.4 kg     Body mass index is 26.79 kg/m.  General:  Well nourished, well developed, in no acute distress  HEENT: normal Neck: no JVD Vascular: No carotid bruits; Distal pulses 2+ bilaterally Cardiac:  normal S1, S2; irreg irreg; no murmur   Lungs:  clear to auscultation bilaterally, no wheezing, rhonchi or rales  Abd: soft, nontender, no hepatomegaly  ZOX:WRUEA arm edema and mild LE edema Musculoskeletal:  No deformities, BUE and BLE strength normal and equal Skin: warm and dry  Neuro:  CNs 2-12 intact, no focal abnormalities noted Psych:  Normal affect   EKG:  The EKG was personally reviewed and demonstrates:  Afib at 96/m Telemetry:  Telemetry was personally reviewed and demonstrates:  in/out Afib with rates up 120's  Relevant CV Studies: Echo 08/07/23 [facility] Left Ventricle  Left ventricle size is normal. Wall thickness is normal. EF: 55-60%. Wall motion is normal. Doppler parameters indicate normal diastolic function.   Right Ventricle  Right ventricle size is normal. Systolic function is normal. Normal tricuspid annular plane systolic excursion (TAPSE) >1.7 cm. Normal systolic excursion velocity by TDI (>9.5 cm/s).  Left Atrium  Left atrium volume index is normal (16-34 mL/m2).   Right Atrium  Right atrium size is normal.   IVC/SVC  The inferior vena cava demonstrates a diameter of >2.1 cm and collapses <50%; therefore, the right atrial  pressure is estimated at 15 mmHg.   Mitral Valve  The mitral valve was not well visualized. There is trace regurgitation. There is no evidence of mitral valve stenosis.   Tricuspid Valve  Tricuspid valve structure is normal. There is trace regurgitation. Unable to assess RVSP due to incomplete Doppler signal.   Aortic Valve  The aortic valve was not well visualized. The leaflets are not thickened and exhibit normal excursion. Mild aortic valve regurgitation. There is no evidence of aortic valve stenosis.   Pulmonic Valve  The pulmonic valve was not well visualized. Trace regurgitation. There is no evidence of pulmonic valve stenosis.   Ascending Aorta  The aortic root is normal in size.   Pericardium  There is no pericardial effusion.   Study Details  A complete echo was performed using complete 2D, color flow Doppler and spectral Doppler. During the study the apical, parasternal, subcostal and suprasternal views were captured. Definity and Saline (bubble) contrast was injected during the study. The study was technically difficult. The study was difficult due to patient's clinical status. The imaging is on file and stored in a permanent location.  Laboratory Data:  High Sensitivity Troponin:   Recent Labs  Lab 09/05/23 0911 09/05/23 1120  TROPONINIHS 9 8     Chemistry Recent Labs  Lab 09/05/23 0911 09/06/23 0440 09/07/23 0505  NA 139 137 137  K 4.1 3.4* 3.8  CL 105 101 101  CO2 27 28 28   GLUCOSE 103* 88 90  BUN 19 19 24*  CREATININE 1.05 1.08 1.03  CALCIUM 9.2 9.2 9.0  MG  --  1.7 2.2  GFRNONAA >60 >60 >60  ANIONGAP 7 8 8     Recent Labs  Lab 09/05/23 0911 09/07/23 0505  PROT 6.8 6.8  ALBUMIN 3.7 3.4*  AST 19 18  ALT 23 21  ALKPHOS 75 68  BILITOT 1.3* 1.0   Lipids No results for input(s): "CHOL", "TRIG", "HDL", "LABVLDL", "LDLCALC", "CHOLHDL" in the last 168 hours.  Hematology Recent Labs  Lab 09/05/23 0911 09/06/23 0440  WBC 7.5 7.0  RBC 4.14* 4.18*   HGB 12.9* 12.5*  HCT 39.7 40.0  MCV 95.9 95.7  MCH 31.2 29.9  MCHC 32.5 31.3  RDW 14.6 14.3  PLT 173 166   Thyroid No results for input(s): "TSH", "FREET4" in the last 168 hours.  BNP Recent Labs  Lab 09/05/23 0911  BNP 388.0*    DDimer  Recent Labs  Lab 09/05/23 1252  DDIMER 1.17*     Radiology/Studies:  CT Angio Chest Pulmonary Embolism (PE) W or WO Contrast  Result Date: 09/05/2023 CLINICAL DATA:  Positive D-dimer and hemoptysis, initial encounter EXAM: CT ANGIOGRAPHY CHEST WITH CONTRAST TECHNIQUE: Multidetector CT imaging of the chest was performed using the standard protocol during bolus administration of intravenous contrast. Multiplanar CT image reconstructions and MIPs were obtained to evaluate the vascular anatomy. RADIATION DOSE REDUCTION: This exam was performed according to the departmental dose-optimization program which includes automated exposure control, adjustment of the mA and/or kV according to patient size and/or use of iterative reconstruction technique. CONTRAST:  75mL OMNIPAQUE IOHEXOL 350 MG/ML SOLN COMPARISON:  Noncontrast CT from earlier in the same day. FINDINGS: Cardiovascular: Atherosclerotic calcifications of the aorta are noted. No aneurysmal  dilatation is seen. The degree of opacification of the aorta is limited precluding evaluation for dissection. The pulmonary artery shows a normal branching pattern bilaterally. No intraluminal filling defect to suggest pulmonary embolism is seen. Coronary calcifications are noted. No cardiac enlargement is noted. Mediastinum/Nodes: Thoracic inlet again shows diffuse enlargement of the right lobe of the thyroid. This has been previously evaluated on ultrasound as well as biopsy. Correlate with those results. No hilar or mediastinal adenopathy is noted. The esophagus as visualized is within normal limits. Lungs/Pleura: Lungs are well aerated bilaterally. Diffuse emphysematous changes are seen. Small bilateral pleural  effusions are noted. Some stable fibrotic changes are noted in the posterior right upper lobe. No new focal infiltrate is seen. The previously noted lower lobe nodular density on the left is again identified and stable. The nodular density on the right has resolved likely related to improved aeration. Upper Abdomen: Visualized upper abdomen shows renal cysts on the left similar to that noted on the prior CT examination. No further follow-up is recommended. Musculoskeletal: No acute rib abnormality is noted. Degenerative changes of the thoracic spine are seen. Review of the MIP images confirms the above findings. IMPRESSION: No evidence of pulmonary emboli. Overall stable appearance of the lungs bilaterally with the exception of the previously mentioned right lower lobe nodule. This has resolved secondary to improved aeration. Small effusions are noted bilaterally. Aortic Atherosclerosis (ICD10-I70.0) and Emphysema (ICD10-J43.9). Electronically Signed   By: Alcide Clever M.D.   On: 09/05/2023 19:53   CT CHEST WO CONTRAST  Result Date: 09/05/2023 CLINICAL DATA:  Chronic dyspnea EXAM: CT CHEST WITHOUT CONTRAST TECHNIQUE: Multidetector CT imaging of the chest was performed following the standard protocol without IV contrast. RADIATION DOSE REDUCTION: This exam was performed according to the departmental dose-optimization program which includes automated exposure control, adjustment of the mA and/or kV according to patient size and/or use of iterative reconstruction technique. COMPARISON:  Right thyroid ultrasound dated June 18, 2017; chest CTA dated January 09, 2011 FINDINGS: Cardiovascular: Cardiomegaly. No pericardial effusion. Normal caliber thoracic aorta with severe atherosclerotic disease. Severe left main and three-vessel coronary artery calcifications. Mediastinum/Nodes: Small hiatal hernia. Enlarged and heterogeneous right thyroid lobe, correlate with previously performed thyroid ultrasound. No enlarged  lymph nodes seen in the chest. Lungs/Pleura: Central airways are patent. Severe centrilobular emphysema. Subpleural nodular opacities of the bilateral lower lobes measuring 16 x 10 mm in the right lower lobe on series 3, image 22 and 10 x 14 mm on image 100 in the left lower lobe. Respiratory motion artifact somewhat limits evaluation of the lung parenchyma. Mild ground-glass opacities of the posterior right upper lobe. Trace right-greater-than-left pleural effusions and atelectasis. Upper Abdomen: Simple appearing cystic lesions of the left kidney, no specific follow-up imaging is necessary. Musculoskeletal: No chest wall mass or suspicious bone lesions identified. IMPRESSION: 1. Mild ground-glass opacities of the posterior right upper lobe, likely infectious or inflammatory. 2. Trace right-greater-than-left pleural effusions and atelectasis. 3. New subpleural nodular opacities of the bilateral lower lobes measuring up to 16 mm in the right lower lobe and 14 mm in the left lower lobe possibly due to focal scarring or atelectasis, although pulmonary nodules can not be excluded. Recommend follow-up chest CT in 3 months to ensure resolution. 4. Cardiomegaly with severe left main and three-vessel coronary artery calcifications 5. Severe aortic Atherosclerosis (ICD10-I70.0) and Emphysema (ICD10-J43.9). Electronically Signed   By: Allegra Lai M.D.   On: 09/05/2023 16:21   DG Chest [facility] 1 View  Result  Date: 09/05/2023 CLINICAL DATA:  Shortness of breath since yesterday EXAM: PORTABLE CHEST 1 VIEW COMPARISON:  04/30/2023 FINDINGS: Interstitial coarsening not seen previously. There is asymmetric density in the right lower lung. Emphysema by prior CT. Normal heart size and mediastinal contours. Stable aortic tortuosity. IMPRESSION: 1. Infiltrate in the right lung suggesting pneumonia. Followup PA and lateral chest X-ray is recommended in 3-4 weeks following trial of antibiotic therapy to ensure resolution. 2.  Generalized interstitial coarsening which may be underlying bronchitis. 3. Emphysema. Electronically Signed   By: Tiburcio Pea M.D.   On: 09/05/2023 08:47     Assessment and Plan:   Afib with some RVR-new diagnosis for patient and recent CVA-will need to be started on Eliquis, stop ASA unless neuro wants him on both-for now leave on ASA 81 mg daily per Dr. Eden Emms,   toprol xl 50 mg daily and add Amiodaorone 200 mg bid. Plan 30 day monitor at discharge.  CHF with normal LVEF on echo 07/2023-limited echo ordered. Suspect secondary to Afib I/O's positive 1.3 L if accurate. On IV lasix 40 mg daily. BNP 388.  Acute resp failure with hypoxia due to CHF & Pneumonia per primary team  CVA 07/2023 treated with TKN and sent home on ASA/statin.  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  Right upper arm swelling since CVA-check venous dopplers.  HTN  COPD quit smoking 20 yrs ago  CKD3 Crt 1.03     Risk Assessment/Risk Scores:        New York Heart Association (NYHA) Functional Class NYHA Class IV  CHA2DS2-VASc Score = 7   This indicates a 11.2% annual risk of stroke. The patient's score is based upon: CHF History: 1 HTN History: 1 Diabetes History: 0 Stroke History: 2 Vascular Disease History: 1 Age Score: 2 Gender Score: 0         For questions or updates, please contact [facility] Please consult [email or web address] for contact info under    Signed, Jacolyn Reedy, PA-C  09/07/2023 10:16 AM

## 2023-09-07 NOTE — Progress Notes (Signed)
  Echocardiogram 2D Echocardiogram has been performed.  Joshua Compton 09/07/2023, 12:35 PM

## 2023-09-07 NOTE — Progress Notes (Addendum)
PROGRESS NOTE  Joshua Compton. YQM:[id number] DOB: [birth year] DOA: 09/05/2023 PCP: [name], MD  Brief History:  82 year old male with a history of prostate cancer, hypertension, hyperlipidemia, prediabetes, stroke with right hemiparesis, GERD presenting with 2-day history of shortness of breath.  The patient has had some associated coughing with white-yellow sputum.  He is complaining of some generalized weakness and myalgias.  He denies any chest pain, nausea, vomiting, direct abdominal pain, dysuria, hematuria, headache, neck pain. The patient has noted some increasing lower extremity edema over the past week.  He has had orthopnea type symptoms.  He has had to sleep in a recliner for the past 2 weeks. Notably, the patient was recently admitted to the hospital from 08/07/2023 to 08/14/2023.  He was transferred to [facility] from [facility] for right-sided weakness and aphasia.  The patient was found to have an acute left frontal lobe infarct that was felt to be cryptogenic.  CTA of the head and neck was negative for LVO.  Echocardiogram showed EF 50-55%, no WMA, normal diastolic function, normal RV function, trace MR/TR, mild AI, no AS.  He was discharged to inpatient rehab where he stayed from 08/14/2023 to 08/24/2023.  He was discharged with aspirin 81 mg daily and Lipitor 40 mg daily for secondary prophylaxis.  He has since returned home to live with his daughter and spouse. In the ED, the patient was afebrile and hemodynamically stable.  Oxygen saturation was in the 70s on room air.  On 4 L nasal cannula oxygen saturation is 95%.  WBC 7.5, hemoglobin 12.9, platelets 173.  Sodium 139, potassium 4.1, bicarbonate 27, serum creatinine 1.05.  LFTs unremarkable.  COVID-19 PCR is negative.  EKG shows sinus rhythm with nonspecific T wave changes.  VBG showed 7.4 3/44/35/29.  Chest x-ray showed increased interstitial markings, right lower lobe infiltrate.  The patient was started on ceftriaxone,  azithromycin, and given furosemide 40 mg IV x 1.   Assessment/Plan: Acute respiratory failure with hypoxia -VBG--7.43/44/35/29 -Secondary to pneumonia and pulmonary edema -Stable on 4 L nasal cannula>>2L -Wean oxygen for saturation greater 92% -09/07/23--ambulatory pulseox on RA>>85% -plan to d/c home on 2L   Acute HFpEF -08/07/2023 echo--EF 50-55%, no WMA, normal diastolic function, normal RV function, trace MR/TR, mild AI, no AS.  -continue IV furosemide 40 mg IV daily -Repeat limited echo -Accurate I's and O's--imcomplete -Daily weights 205>>198 -ReDS reading--26  New onset afib -cardiology consult -continue metoprolol succinate -CHADSVASc = 7 -plan to start [facility] and monitor hemoptysis   Lobar pneumonia/hemoptysis -small volume hemoptysis -Check PCT<0.10 -Personally reviewed chest x-ray--right lower lobe opacity -Continue ceftriaxone and azithromycin -Check MRSA--neg -10/19 CTA chest neg for PE; trace bilateral pleural eff R>L.  Subpleural nodular densities bilateral LL and mild GGO RUL -anti-tussive med   COPD -Start bronchodilators -Patient has about 20-pack-year history of tobacco, quit smoking 20 years ago   Essential hypertension -Continue metoprolol succinate -Discontinue hydrochlorothiazide -Holding enalapril for now due to renal insufficiency   CKD stage IIIa -Baseline creatinine 1.0-1.4 -Monitor with diuresis   History of stroke -Continue aspirin and statin   Mixed hyperlipidemia -Continue statin -LDL 70 07/2023   Hypokalemia -replete -mag 1.7           Family Communication:   daughters updated at bedside 10/20   Consultants:  none   Code Status:  FULL    DVT Prophylaxis:  [place] Lovenox     Procedures: As Listed in Progress Note  Above   Antibiotics: Ceftriaxone 10/19>> Azithro 10/19>>        Subjective: Pt states he is breathing better.  Denies f/c, cp, n/v/d, abd pain  Objective: Vitals:   09/06/23 0500 09/06/23 2023  09/07/23 0425 09/07/23 0500  BP:  (!) 129/93 (!) 140/60   Pulse:  (!) 101 60   Resp:      Temp:  97.9 F (36.6 C) 97.9 F (36.6 C)   TempSrc:  Oral Oral   SpO2:  95% 98%   Weight: 89.8 kg   89.6 kg  Height:        Intake/Output Summary (Last 24 hours) at 09/07/2023 0919 Last data filed at 09/06/2023 2130 Gross per 24 hour  Intake 822.64 ml  Output --  Net 822.64 ml   Weight change: -2.934 kg Exam:  General:  Pt is alert, follows commands appropriately, not in acute distress HEENT: No icterus, No thrush, No neck mass, [place]/AT Cardiovascular: IRRR, S1/S2, no rubs, no gallops Respiratory: diminished BS. Bibasilar rales.  No wheeze Abdomen: Soft/+BS, non tender, non distended, no guarding Extremities: 1 + LE edema, No lymphangitis, No petechiae, No rashes, no synovitis   Data Reviewed: I have personally reviewed following labs and imaging studies Basic Metabolic Panel: Recent Labs  Lab 09/05/23 0911 09/06/23 0440 09/07/23 0505  NA 139 137 137  K 4.1 3.4* 3.8  CL 105 101 101  CO2 27 28 28   GLUCOSE 103* 88 90  BUN 19 19 24*  CREATININE 1.05 1.08 1.03  CALCIUM 9.2 9.2 9.0  MG  --  1.7 2.2   Liver Function Tests: Recent Labs  Lab 09/05/23 0911 09/07/23 0505  AST 19 18  ALT 23 21  ALKPHOS 75 68  BILITOT 1.3* 1.0  PROT 6.8 6.8  ALBUMIN 3.7 3.4*   No results for input(s): "LIPASE", "AMYLASE" in the last 168 hours. No results for input(s): "AMMONIA" in the last 168 hours. Coagulation Profile: No results for input(s): "INR", "PROTIME" in the last 168 hours. CBC: Recent Labs  Lab 09/05/23 0911 09/06/23 0440  WBC 7.5 7.0  NEUTROABS 5.2  --   HGB 12.9* 12.5*  HCT 39.7 40.0  MCV 95.9 95.7  PLT 173 166   Cardiac Enzymes: No results for input(s): "CKTOTAL", "CKMB", "CKMBINDEX", "TROPONINI" in the last 168 hours. BNP: Invalid input(s): "POCBNP" CBG: No results for input(s): "GLUCAP" in the last 168 hours. HbA1C: No results for input(s): "HGBA1C" in the  last 72 hours. Urine analysis:    Component Value Date/Time   COLORURINE STRAW (A) 01/03/2021 1023   APPEARANCEUR CLEAR 01/03/2021 1023   APPEARANCEUR Clear 11/11/2017 1445   LABSPEC 1.008 01/03/2021 1023   PHURINE 6.0 01/03/2021 1023   GLUCOSEU NEGATIVE 01/03/2021 1023   HGBUR MODERATE (A) 01/03/2021 1023   BILIRUBINUR NEGATIVE 01/03/2021 1023   BILIRUBINUR Negative 11/11/2017 1445   KETONESUR NEGATIVE 01/03/2021 1023   PROTEINUR NEGATIVE 01/03/2021 1023   UROBILINOGEN 0.2 04/29/2013 0646   NITRITE NEGATIVE 01/03/2021 1023   LEUKOCYTESUR NEGATIVE 01/03/2021 1023   Sepsis Labs: @LABRCNTIP (procalcitonin:4,lacticidven:4) ) Recent Results (from the past 240 hour(s))  Resp panel by RT-PCR (RSV, Flu A&B, Covid) Anterior Nasal Swab     Status: None   Collection Time: 09/05/23  8:25 AM   Specimen: Anterior Nasal Swab  Result Value Ref Range Status   SARS Coronavirus 2 by RT PCR NEGATIVE NEGATIVE Final    Comment: (NOTE) SARS-CoV-2 target nucleic acids are NOT DETECTED.  The SARS-CoV-2 RNA is generally detectable in  upper respiratory specimens during the acute phase of infection. The lowest concentration of SARS-CoV-2 viral copies this assay can detect is 138 copies/mL. A negative result does not preclude SARS-Cov-2 infection and should not be used as the sole basis for treatment or other patient management decisions. A negative result may occur with  improper specimen collection/handling, submission of specimen other than nasopharyngeal swab, presence of viral mutation(s) within the areas targeted by this assay, and inadequate number of viral copies(<138 copies/mL). A negative result must be combined with clinical observations, patient history, and epidemiological information. The expected result is Negative.  Fact Sheet for Patients:  BloggerCourse.com  Fact Sheet for Healthcare Providers:  SeriousBroker.it  This test is no  t yet approved or cleared by the [place] FDA and  has been authorized for detection and/or diagnosis of SARS-CoV-2 by FDA under an Emergency Use Authorization (EUA). This EUA will remain  in effect (meaning this test can be used) for the duration of the COVID-19 declaration under Section 564(b)(1) of the Act, 21 U.S.C.section 360bbb-3(b)(1), unless the authorization is terminated  or revoked sooner.       Influenza A by PCR NEGATIVE NEGATIVE Final   Influenza B by PCR NEGATIVE NEGATIVE Final    Comment: (NOTE) The Xpert Xpress SARS-CoV-2/FLU/RSV plus assay is intended as an aid in the diagnosis of influenza from Nasopharyngeal swab specimens and should not be used as a sole basis for treatment. Nasal washings and aspirates are unacceptable for Xpert Xpress SARS-CoV-2/FLU/RSV testing.  Fact Sheet for Patients: BloggerCourse.com  Fact Sheet for Healthcare Providers: SeriousBroker.it  This test is not yet approved or cleared by the [place] FDA and has been authorized for detection and/or diagnosis of SARS-CoV-2 by FDA under an Emergency Use Authorization (EUA). This EUA will remain in effect (meaning this test can be used) for the duration of the COVID-19 declaration under Section 564(b)(1) of the Act, 21 U.S.C. section 360bbb-3(b)(1), unless the authorization is terminated or revoked.     Resp Syncytial Virus by PCR NEGATIVE NEGATIVE Final    Comment: (NOTE) Fact Sheet for Patients: BloggerCourse.com  Fact Sheet for Healthcare Providers: SeriousBroker.it  This test is not yet approved or cleared by the [place] FDA and has been authorized for detection and/or diagnosis of SARS-CoV-2 by FDA under an Emergency Use Authorization (EUA). This EUA will remain in effect (meaning this test can be used) for the duration of the COVID-19 declaration under Section  564(b)(1) of the Act, 21 U.S.C. section 360bbb-3(b)(1), unless the authorization is terminated or revoked.  Performed at [facility], [address] [place], [place] [address]   MRSA Next Gen by PCR, Nasal     Status: None   Collection Time: 09/05/23  1:05 PM   Specimen: Nasal Mucosa; Nasal Swab  Result Value Ref Range Status   MRSA by PCR Next Gen NOT DETECTED NOT DETECTED Final    Comment: (NOTE) The GeneXpert MRSA Assay (FDA approved for NASAL specimens only), is one component of a comprehensive MRSA colonization surveillance program. It is not intended to diagnose MRSA infection nor to guide or monitor treatment for MRSA infections. Test performance is not FDA approved in patients less than 49 years old. Performed at [facility], [address] [place], [place] [address]   Respiratory (~20 pathogens) panel by PCR     Status: None   Collection Time: 09/05/23  1:05 PM   Specimen: Nasopharyngeal Swab; Respiratory  Result Value Ref Range Status   Adenovirus NOT  DETECTED NOT DETECTED Final   Coronavirus 229E NOT DETECTED NOT DETECTED Final    Comment: (NOTE) The Coronavirus on the Respiratory Panel, DOES NOT test for the novel  Coronavirus (2019 nCoV)    Coronavirus HKU1 NOT DETECTED NOT DETECTED Final   Coronavirus NL63 NOT DETECTED NOT DETECTED Final   Coronavirus OC43 NOT DETECTED NOT DETECTED Final   Metapneumovirus NOT DETECTED NOT DETECTED Final   Rhinovirus / Enterovirus NOT DETECTED NOT DETECTED Final   Influenza A NOT DETECTED NOT DETECTED Final   Influenza B NOT DETECTED NOT DETECTED Final   Parainfluenza Virus 1 NOT DETECTED NOT DETECTED Final   Parainfluenza Virus 2 NOT DETECTED NOT DETECTED Final   Parainfluenza Virus 3 NOT DETECTED NOT DETECTED Final   Parainfluenza Virus 4 NOT DETECTED NOT DETECTED Final   Respiratory Syncytial Virus NOT DETECTED NOT DETECTED Final   Bordetella pertussis NOT DETECTED NOT DETECTED Final   Bordetella Parapertussis NOT DETECTED  NOT DETECTED Final   Chlamydophila pneumoniae NOT DETECTED NOT DETECTED Final   Mycoplasma pneumoniae NOT DETECTED NOT DETECTED Final    Comment: Performed at [facility], [address] [place], [place] [address]     Scheduled Meds:  aspirin  81 mg Oral Daily   atorvastatin  40 mg Oral QHS   azithromycin  500 mg Oral Daily   dextromethorphan-guaiFENesin  1 tablet Oral BID   dorzolamide-timolol  1 drop Both Eyes Daily   enoxaparin (LOVENOX) injection  40 mg Subcutaneous Q24H   fluticasone  2 spray Each Nare Daily   furosemide  40 mg Intravenous Daily   metoprolol succinate  50 mg Oral Daily   pantoprazole  40 mg Oral Daily   rOPINIRole  1 mg Oral TID   sodium chloride flush  10 mL Intravenous Q12H   sodium chloride flush  3 mL Intravenous Q12H   Continuous Infusions:  cefTRIAXone (ROCEPHIN)  IV 1 g (09/06/23 0931)    Procedures/Studies: CT Angio Chest Pulmonary Embolism (PE) W or WO Contrast  Result Date: 09/05/2023 CLINICAL DATA:  Positive D-dimer and hemoptysis, initial encounter EXAM: CT ANGIOGRAPHY CHEST WITH CONTRAST TECHNIQUE: Multidetector CT imaging of the chest was performed using the standard protocol during bolus administration of intravenous contrast. Multiplanar CT image reconstructions and MIPs were obtained to evaluate the vascular anatomy. RADIATION DOSE REDUCTION: This exam was performed according to the departmental dose-optimization program which includes automated exposure control, adjustment of the mA and/or kV according to patient size and/or use of iterative reconstruction technique. CONTRAST:  75mL OMNIPAQUE IOHEXOL 350 MG/ML SOLN COMPARISON:  Noncontrast CT from earlier in the same day. FINDINGS: Cardiovascular: Atherosclerotic calcifications of the aorta are noted. No aneurysmal dilatation is seen. The degree of opacification of the aorta is limited precluding evaluation for dissection. The pulmonary artery shows a normal branching pattern bilaterally.  No intraluminal filling defect to suggest pulmonary embolism is seen. Coronary calcifications are noted. No cardiac enlargement is noted. Mediastinum/Nodes: Thoracic inlet again shows diffuse enlargement of the right lobe of the thyroid. This has been previously evaluated on ultrasound as well as biopsy. Correlate with those results. No hilar or mediastinal adenopathy is noted. The esophagus as visualized is within normal limits. Lungs/Pleura: Lungs are well aerated bilaterally. Diffuse emphysematous changes are seen. Small bilateral pleural effusions are noted. Some stable fibrotic changes are noted in the posterior right upper lobe. No new focal infiltrate is seen. The previously noted lower lobe nodular density on the left is again identified and stable.  The nodular density on the right has resolved likely related to improved aeration. Upper Abdomen: Visualized upper abdomen shows renal cysts on the left similar to that noted on the prior CT examination. No further follow-up is recommended. Musculoskeletal: No acute rib abnormality is noted. Degenerative changes of the thoracic spine are seen. Review of the MIP images confirms the above findings. IMPRESSION: No evidence of pulmonary emboli. Overall stable appearance of the lungs bilaterally with the exception of the previously mentioned right lower lobe nodule. This has resolved secondary to improved aeration. Small effusions are noted bilaterally. Aortic Atherosclerosis (ICD10-I70.0) and Emphysema (ICD10-J43.9). Electronically Signed   By: Alcide Clever M.D.   On: 09/05/2023 19:53   CT CHEST WO CONTRAST  Result Date: 09/05/2023 CLINICAL DATA:  Chronic dyspnea EXAM: CT CHEST WITHOUT CONTRAST TECHNIQUE: Multidetector CT imaging of the chest was performed following the standard protocol without IV contrast. RADIATION DOSE REDUCTION: This exam was performed according to the departmental dose-optimization program which includes automated exposure control,  adjustment of the mA and/or kV according to patient size and/or use of iterative reconstruction technique. COMPARISON:  Right thyroid ultrasound dated June 18, 2017; chest CTA dated January 09, 2011 FINDINGS: Cardiovascular: Cardiomegaly. No pericardial effusion. Normal caliber thoracic aorta with severe atherosclerotic disease. Severe left main and three-vessel coronary artery calcifications. Mediastinum/Nodes: Small hiatal hernia. Enlarged and heterogeneous right thyroid lobe, correlate with previously performed thyroid ultrasound. No enlarged lymph nodes seen in the chest. Lungs/Pleura: Central airways are patent. Severe centrilobular emphysema. Subpleural nodular opacities of the bilateral lower lobes measuring 16 x 10 mm in the right lower lobe on series 3, image 22 and 10 x 14 mm on image 100 in the left lower lobe. Respiratory motion artifact somewhat limits evaluation of the lung parenchyma. Mild ground-glass opacities of the posterior right upper lobe. Trace right-greater-than-left pleural effusions and atelectasis. Upper Abdomen: Simple appearing cystic lesions of the left kidney, no specific follow-up imaging is necessary. Musculoskeletal: No chest wall mass or suspicious bone lesions identified. IMPRESSION: 1. Mild ground-glass opacities of the posterior right upper lobe, likely infectious or inflammatory. 2. Trace right-greater-than-left pleural effusions and atelectasis. 3. New subpleural nodular opacities of the bilateral lower lobes measuring up to 16 mm in the right lower lobe and 14 mm in the left lower lobe possibly due to focal scarring or atelectasis, although pulmonary nodules can not be excluded. Recommend follow-up chest CT in 3 months to ensure resolution. 4. Cardiomegaly with severe left main and three-vessel coronary artery calcifications 5. Severe aortic Atherosclerosis (ICD10-I70.0) and Emphysema (ICD10-J43.9). Electronically Signed   By: Allegra Lai M.D.   On: 09/05/2023 16:21    DG Chest Port 1 View  Result Date: 09/05/2023 CLINICAL DATA:  Shortness of breath since yesterday EXAM: PORTABLE CHEST 1 VIEW COMPARISON:  04/30/2023 FINDINGS: Interstitial coarsening not seen previously. There is asymmetric density in the right lower lung. Emphysema by prior CT. Normal heart size and mediastinal contours. Stable aortic tortuosity. IMPRESSION: 1. Infiltrate in the right lung suggesting pneumonia. Followup PA and lateral chest X-ray is recommended in 3-4 weeks following trial of antibiotic therapy to ensure resolution. 2. Generalized interstitial coarsening which may be underlying bronchitis. 3. Emphysema. Electronically Signed   By: Tiburcio Pea M.D.   On: 09/05/2023 08:47    Catarina Hartshorn, DO  [facility]  If 7PM-7AM, please contact night-coverage [email or web address] Password [email or web address] 09/07/2023, 9:19 AM   LOS: 2 days

## 2023-09-07 NOTE — TOC Benefit Eligibility Note (Signed)
Patient Product/process development scientist completed.    The patient is insured through [employer]. Patient has Medicare and is not eligible for a copay card, but may be able to apply for patient assistance, if available.    Ran test claim for Eliquis 5 mg and the current 30 day co-pay is $47.00.  Ran test claim for Entresto 24-26 mg and the current 30 day co-pay is $47.00.  Ran test claim for Farxiga 10 mg and the current 30 day co-pay is $47.00.  Ran test claim for Jardiance 10 mg and the current 30 day co-pay is $47.00.  This test claim was processed through [facility]- copay amounts may vary at other pharmacies due to pharmacy/plan contracts, or as the patient moves through the different stages of their insurance plan.     Roland Earl, CPHT Pharmacy Technician III Certified Patient Advocate [facility] Patient Advocate Team Direct Number: [phone]  Fax: [phone]

## 2023-09-07 NOTE — Progress Notes (Signed)
SATURATION QUALIFICATIONS: (This note is used to comply with regulatory documentation for home oxygen)   Patient Saturations on Room Air at Rest = 93   Patient Saturations on Room Air while Ambulating = 85   Patient Saturations on 2 Liters of oxygen while Ambulating = 95   Please briefly explain why patient needs home oxygen: To maintain 02 sat at 90% or above during ambulation.   Joshua Hartshorn, DO

## 2023-09-08 ENCOUNTER — Ambulatory Visit: Payer: Medicare HMO | Admitting: Family Medicine

## 2023-09-08 ENCOUNTER — Other Ambulatory Visit (HOSPITAL_COMMUNITY): Payer: Self-pay

## 2023-09-08 ENCOUNTER — Inpatient Hospital Stay: Payer: Medicare HMO | Admitting: Family Medicine

## 2023-09-08 DIAGNOSIS — I480 Paroxysmal atrial fibrillation: Secondary | ICD-10-CM | POA: Diagnosis not present

## 2023-09-08 DIAGNOSIS — I5021 Acute systolic (congestive) heart failure: Secondary | ICD-10-CM | POA: Diagnosis not present

## 2023-09-08 DIAGNOSIS — J9601 Acute respiratory failure with hypoxia: Secondary | ICD-10-CM | POA: Diagnosis not present

## 2023-09-08 HISTORY — DX: Acute systolic (congestive) heart failure: I50.21

## 2023-09-08 LAB — BASIC METABOLIC PANEL
Anion gap: 7 (ref 5–15)
BUN: 31 mg/dL — ABNORMAL HIGH (ref 8–23)
CO2: 28 mmol/L (ref 22–32)
Calcium: 8.8 mg/dL — ABNORMAL LOW (ref 8.9–10.3)
Chloride: 103 mmol/L (ref 98–111)
Creatinine, Ser: 1.13 mg/dL (ref 0.61–1.24)
GFR, Estimated: >60 mL/min (ref >60)
Glucose, Bld: 128 mg/dL — ABNORMAL HIGH (ref 70–99)
Potassium: 3.5 mmol/L (ref 3.5–5.1)
Sodium: 138 mmol/L (ref 135–145)

## 2023-09-08 LAB — MAGNESIUM: Magnesium: 2.1 mg/dL (ref 1.7–2.4)

## 2023-09-08 LAB — CBC
HCT: 39.1 % (ref 39.0–52.0)
Hemoglobin: 12.7 g/dL — ABNORMAL LOW (ref 13.0–17.0)
MCH: 31 pg (ref 26.0–34.0)
MCHC: 32.5 g/dL (ref 30.0–36.0)
MCV: 95.4 fL (ref 80.0–100.0)
Platelets: 177 10*3/uL (ref 150–400)
RBC: 4.1 MIL/uL — ABNORMAL LOW (ref 4.22–5.81)
RDW: 14 % (ref 11.5–15.5)
WBC: 5 10*3/uL (ref 4.0–10.5)
nRBC: 0 % (ref 0.0–0.2)

## 2023-09-08 MED ORDER — FUROSEMIDE 40 MG PO TABS: 40.0000 mg | ORAL_TABLET | Freq: Every day | ORAL | 1 refills | Status: DC

## 2023-09-08 MED ORDER — DAPAGLIFLOZIN PROPANEDIOL 10 MG PO TABS: 10.0000 mg | ORAL_TABLET | Freq: Every day | ORAL | 1 refills | Status: DC

## 2023-09-08 MED ORDER — DAPAGLIFLOZIN PROPANEDIOL 10 MG PO TABS
10.0000 mg | ORAL_TABLET | Freq: Every day | ORAL | Status: DC
  Administered 2023-09-08: 10 mg via ORAL
  Filled 2023-09-08: qty 1

## 2023-09-08 MED ORDER — DOXYCYCLINE HYCLATE 100 MG PO TABS: 100.0000 mg | ORAL_TABLET | Freq: Two times a day (BID) | ORAL | 0 refills | Status: DC

## 2023-09-08 MED ORDER — SACUBITRIL-VALSARTAN 24-26 MG PO TABS: 1.0000 | ORAL_TABLET | Freq: Two times a day (BID) | ORAL | 1 refills | Status: DC

## 2023-09-08 MED ORDER — CEFDINIR 300 MG PO CAPS: 300.0000 mg | ORAL_CAPSULE | Freq: Two times a day (BID) | ORAL | 0 refills | Status: DC

## 2023-09-08 MED ORDER — FUROSEMIDE 40 MG PO TABS: 40.0000 mg | ORAL_TABLET | Freq: Every day | ORAL | Status: DC

## 2023-09-08 MED ORDER — APIXABAN 5 MG PO TABS: 5.0000 mg | ORAL_TABLET | Freq: Two times a day (BID) | ORAL | 1 refills | Status: DC

## 2023-09-08 MED ORDER — SACUBITRIL-VALSARTAN 24-26 MG PO TABS
1.0000 | ORAL_TABLET | Freq: Two times a day (BID) | ORAL | Status: DC
  Administered 2023-09-08: 1 via ORAL
  Filled 2023-09-08: qty 1

## 2023-09-08 MED ORDER — CEFDINIR 300 MG PO CAPS
300.0000 mg | ORAL_CAPSULE | Freq: Two times a day (BID) | ORAL | Status: DC
  Administered 2023-09-08: 300 mg via ORAL
  Filled 2023-09-08: qty 1

## 2023-09-08 MED ORDER — AMIODARONE HCL 200 MG PO TABS: 200.0000 mg | ORAL_TABLET | Freq: Two times a day (BID) | ORAL | 1 refills | Status: DC

## 2023-09-08 NOTE — Progress Notes (Signed)
SATURATION QUALIFICATIONS: (This note is used to comply with regulatory documentation for home oxygen)   Patient Saturations on Room Air at Rest = 93   Patient Saturations on Room Air while Ambulating = 88   Patient Saturations on 2 Liters of oxygen while Ambulating = 95   Please briefly explain why patient needs home oxygen: To maintain 02 sat at 90% or above during ambulation.  Catarina Hartshorn, DO

## 2023-09-08 NOTE — TOC Transition Note (Signed)
Transition of Care [facility]) - CM/SW Discharge Note   Patient Details  Name: Joshua Compton. MRN: [id number] Date of Birth: 1941-03-09  Transition of Care [facility]) CM/SW Contact:  Elliot Gault, LCSW Phone Number: 09/08/2023, 11:32 AM   Clinical Narrative:     Pt medically stable for dc per MD. HH orders and Home O2 orders placed.  Met with pt and daughter at bedside to review dc planning. Pt currently active with [facility] for HH. CMS provider options for O2 reviewed and referred to [facility] as requested. Portable tank at bedside for dc.  Updated [facility] of pt's dc. No other TOC needs identified.  Final next level of care: Home w Home Health Services Barriers to Discharge: Barriers Resolved   Patient Goals and CMS Choice CMS Medicare.gov Compare Post Acute Care list provided to:: Patient Choice offered to / list presented to : Adult Children  Discharge Placement                         Discharge Plan and Services Additional resources added to the After Visit Summary for   In-house Referral: Clinical Social Work Discharge Planning Services: CM Consult Post Acute Care Choice: Durable Medical Equipment          DME Arranged: Oxygen DME Agency: Patsy Lager Date DME Agency Contacted: 09/08/23   Representative spoke with at DME Agency: Morrie Sheldon HH Arranged: RN, PT, OT, Speech Therapy HH Agency: [facility] Date [facility] Agency Contacted: 09/08/23   Representative spoke with at [facility] Agency: Clifton Custard  Social Determinants of Health (SDOH) Interventions SDOH Screenings   Food Insecurity: No Food Insecurity (09/05/2023)  Housing: Low Risk  (09/05/2023)  Transportation Needs: No Transportation Needs (09/05/2023)  Utilities: Not At Risk (09/05/2023)  Alcohol Screen: Low Risk  (01/14/2023)  Depression (PHQ2-9): Low Risk  (07/28/2023)  Financial Resource Strain: Low Risk  (01/14/2023)  Physical Activity: Sufficiently Active (01/14/2023)  Social Connections: Unknown  (08/06/2023)   [facility]  Stress: Patient Unable To Answer (08/07/2023)   Received from [facility]  Tobacco Use: Medium Risk (09/05/2023)     Readmission Risk Interventions    09/06/2023   10:18 AM  Readmission Risk Prevention Plan  Transportation Screening Complete  Home Care Screening Complete  Medication Review (RN CM) Complete

## 2023-09-08 NOTE — Discharge Summary (Addendum)
08/14/41             BSA:          2.119 m Patient Age:    82 years              BP:           140/60 mmHg Patient Gender: M                     HR:           64 bpm. Exam [facility] Procedure: Limited Echo, Cardiac Doppler, Color Doppler and Intracardiac            Opacification Agent Indications:    I50.40* Unspecified combined systolic (congestive) and diastolic                 (congestive) heart failure  History:        Patient has no prior history of Echocardiogram examinations.                 CHF, Stroke, Signs/Symptoms:Edema, Dyspnea and Shortness of                 Breath; Risk Factors:Hypertension. Patient had echo at another                 health system. Cancer.  Sonographer:    Sheralyn Boatman RDCS Referring Phys: [phone] Lyndsay Talamante  Sonographer Comments: Technically difficult study due to poor echo windows and Technically challenging study due to limited acoustic windows. Extremely difficult imges. IMPRESSIONS  1. Left ventricular ejection fraction, by estimation, is 40 to 45%. The left ventricle has mildly decreased function. The left ventricle demonstrates global hypokinesis. The left ventricular internal cavity size was moderately dilated.  2.  The mitral valve is grossly normal. No evidence of mitral valve regurgitation.  3. The aortic valve is calcified. Aortic valve regurgitation is mild to moderate. Aortic valve sclerosis is present, with no evidence of aortic valve stenosis. FINDINGS  Left Ventricle: Left ventricular ejection fraction, by estimation, is 40 to 45%. The left ventricle has mildly decreased function. The left ventricle demonstrates global hypokinesis. Definity contrast agent was given IV to delineate the left ventricular  endocardial borders. The left ventricular internal cavity size was moderately dilated. Pericardium: There is no evidence of pericardial effusion. Mitral Valve: The mitral valve is grossly normal. Aortic Valve: The aortic valve is calcified. Aortic valve regurgitation is mild to moderate. Aortic regurgitation PHT measures 616 msec. Aortic valve sclerosis is present, with no evidence of aortic valve stenosis. Aortic valve mean gradient measures 4.0  mmHg. Aortic valve peak gradient measures 8.5 mmHg. IAS/Shunts: The interatrial septum was not well visualized. Additional Comments: Spectral Doppler performed. Color Doppler performed.  LEFT VENTRICLE PLAX 2D LVIDd:         5.30 cm LVIDs:         4.60 cm LV PW:         1.00 cm LV IVS:        0.90 cm  LV Volumes (MOD) LV vol d, MOD A2C: 174.0 ml LV vol d, MOD A4C: 193.0 ml LV vol s, MOD A2C: 114.0 ml LV vol s, MOD A4C: 99.6 ml LV SV MOD A2C:     60.0 ml LV SV MOD A4C:     193.0 ml LV SV MOD BP:      77.4 ml IVC IVC diam: 1.80 cm AORTIC VALVE AV Vmax:  08/14/41             BSA:          2.119 m Patient Age:    82 years              BP:           140/60 mmHg Patient Gender: M                     HR:           64 bpm. [facility] Procedure: Limited Echo, Cardiac Doppler, Color Doppler and Intracardiac            Opacification Agent Indications:    I50.40* Unspecified combined systolic (congestive) and diastolic                 (congestive) heart failure  History:        Patient has no prior history of Echocardiogram examinations.                 CHF, Stroke, Signs/Symptoms:Edema, Dyspnea and Shortness of                 Breath; Risk Factors:Hypertension. Patient had echo at another                 health system. Cancer.  Sonographer:    Sheralyn Boatman RDCS Referring Phys: [phone] Lyndsay Talamante  Sonographer Comments: Technically difficult study due to poor echo windows and Technically challenging study due to limited acoustic windows. Extremely difficult imges. IMPRESSIONS  1. Left ventricular ejection fraction, by estimation, is 40 to 45%. The left ventricle has mildly decreased function. The left ventricle demonstrates global hypokinesis. The left ventricular internal cavity size was moderately dilated.  2.  The mitral valve is grossly normal. No evidence of mitral valve regurgitation.  3. The aortic valve is calcified. Aortic valve regurgitation is mild to moderate. Aortic valve sclerosis is present, with no evidence of aortic valve stenosis. FINDINGS  Left Ventricle: Left ventricular ejection fraction, by estimation, is 40 to 45%. The left ventricle has mildly decreased function. The left ventricle demonstrates global hypokinesis. Definity contrast agent was given IV to delineate the left ventricular  endocardial borders. The left ventricular internal cavity size was moderately dilated. Pericardium: There is no evidence of pericardial effusion. Mitral Valve: The mitral valve is grossly normal. Aortic Valve: The aortic valve is calcified. Aortic valve regurgitation is mild to moderate. Aortic regurgitation PHT measures 616 msec. Aortic valve sclerosis is present, with no evidence of aortic valve stenosis. Aortic valve mean gradient measures 4.0  mmHg. Aortic valve peak gradient measures 8.5 mmHg. IAS/Shunts: The interatrial septum was not well visualized. Additional Comments: Spectral Doppler performed. Color Doppler performed.  LEFT VENTRICLE PLAX 2D LVIDd:         5.30 cm LVIDs:         4.60 cm LV PW:         1.00 cm LV IVS:        0.90 cm  LV Volumes (MOD) LV vol d, MOD A2C: 174.0 ml LV vol d, MOD A4C: 193.0 ml LV vol s, MOD A2C: 114.0 ml LV vol s, MOD A4C: 99.6 ml LV SV MOD A2C:     60.0 ml LV SV MOD A4C:     193.0 ml LV SV MOD BP:      77.4 ml IVC IVC diam: 1.80 cm AORTIC VALVE AV Vmax:  08/14/41             BSA:          2.119 m Patient Age:    82 years              BP:           140/60 mmHg Patient Gender: M                     HR:           64 bpm. [facility] Procedure: Limited Echo, Cardiac Doppler, Color Doppler and Intracardiac            Opacification Agent Indications:    I50.40* Unspecified combined systolic (congestive) and diastolic                 (congestive) heart failure  History:        Patient has no prior history of Echocardiogram examinations.                 CHF, Stroke, Signs/Symptoms:Edema, Dyspnea and Shortness of                 Breath; Risk Factors:Hypertension. Patient had echo at another                 health system. Cancer.  Sonographer:    Sheralyn Boatman RDCS Referring Phys: [phone] Lyndsay Talamante  Sonographer Comments: Technically difficult study due to poor echo windows and Technically challenging study due to limited acoustic windows. Extremely difficult imges. IMPRESSIONS  1. Left ventricular ejection fraction, by estimation, is 40 to 45%. The left ventricle has mildly decreased function. The left ventricle demonstrates global hypokinesis. The left ventricular internal cavity size was moderately dilated.  2.  The mitral valve is grossly normal. No evidence of mitral valve regurgitation.  3. The aortic valve is calcified. Aortic valve regurgitation is mild to moderate. Aortic valve sclerosis is present, with no evidence of aortic valve stenosis. FINDINGS  Left Ventricle: Left ventricular ejection fraction, by estimation, is 40 to 45%. The left ventricle has mildly decreased function. The left ventricle demonstrates global hypokinesis. Definity contrast agent was given IV to delineate the left ventricular  endocardial borders. The left ventricular internal cavity size was moderately dilated. Pericardium: There is no evidence of pericardial effusion. Mitral Valve: The mitral valve is grossly normal. Aortic Valve: The aortic valve is calcified. Aortic valve regurgitation is mild to moderate. Aortic regurgitation PHT measures 616 msec. Aortic valve sclerosis is present, with no evidence of aortic valve stenosis. Aortic valve mean gradient measures 4.0  mmHg. Aortic valve peak gradient measures 8.5 mmHg. IAS/Shunts: The interatrial septum was not well visualized. Additional Comments: Spectral Doppler performed. Color Doppler performed.  LEFT VENTRICLE PLAX 2D LVIDd:         5.30 cm LVIDs:         4.60 cm LV PW:         1.00 cm LV IVS:        0.90 cm  LV Volumes (MOD) LV vol d, MOD A2C: 174.0 ml LV vol d, MOD A4C: 193.0 ml LV vol s, MOD A2C: 114.0 ml LV vol s, MOD A4C: 99.6 ml LV SV MOD A2C:     60.0 ml LV SV MOD A4C:     193.0 ml LV SV MOD BP:      77.4 ml IVC IVC diam: 1.80 cm AORTIC VALVE AV Vmax:  Physician Discharge Summary   Patient: Joshua Compton. MRN: [id number] DOB: 03/22/1941  Admit date:     09/05/2023  Discharge date: 09/08/23  Discharge Physician: Onalee Hua Jermane Brayboy   PCP: [name], MD   Recommendations at discharge:   Please follow up with primary care provider within 1-2 weeks  Please repeat BMP and CBC in one week     Hospital Course: 82 year old male with a history of prostate cancer, hypertension, hyperlipidemia, prediabetes, stroke with right hemiparesis, GERD presenting with 2-day history of shortness of breath.  The patient has had some associated coughing with white-yellow sputum.  He is complaining of some generalized weakness and myalgias.  He denies any chest pain, nausea, vomiting, direct abdominal pain, dysuria, hematuria, headache, neck pain. The patient has noted some increasing lower extremity edema over the past week.  He has had orthopnea type symptoms.  He has had to sleep in a recliner for the past 2 weeks. Notably, the patient was recently admitted to the hospital from 08/07/2023 to 08/14/2023.  He was transferred to [facility] from [facility] for right-sided weakness and aphasia.  The patient was found to have an acute left frontal lobe infarct that was felt to be cryptogenic.  CTA of the head and neck was negative for LVO.  Echocardiogram showed EF 50-55%, no WMA, normal diastolic function, normal RV function, trace MR/TR, mild AI, no AS.  He was discharged to inpatient rehab where he stayed from 08/14/2023 to 08/24/2023.  He was discharged with aspirin 81 mg daily and Lipitor 40 mg daily for secondary prophylaxis.  He has since returned home to live with his daughter and spouse. In the ED, the patient was afebrile and hemodynamically stable.  Oxygen saturation was in the 70s on room air.  On 4 L nasal cannula oxygen saturation is 95%.  WBC 7.5, hemoglobin 12.9, platelets 173.  Sodium 139, potassium 4.1, bicarbonate 27, serum creatinine 1.05.  LFTs unremarkable.   COVID-19 PCR is negative.  EKG shows sinus rhythm with nonspecific T wave changes.  VBG showed 7.4 3/44/35/29.  Chest x-ray showed increased interstitial markings, right lower lobe infiltrate.  The patient was started on ceftriaxone, azithromycin, and given furosemide 40 mg IV x 1.  Assessment and Plan: Acute respiratory failure with hypoxia -VBG--7.43/44/35/29 -Secondary to pneumonia and pulmonary edema -Stable on 4 L nasal cannula>>2L -Wean oxygen for saturation greater 92% -09/08/23--ambulatory pulseox on RA>>85% -plan to d/c home on 2L   Acute HFrEF -08/07/2023 echo--EF 50-55%, no WMA, normal diastolic function, normal RV function, trace MR/TR, mild AI, no AS.  -continue IV furosemide 40 mg IV daily -09/07/23 limited echo EF 40-45%, global HK, mild-mod AI -Accurate I's and O's--imcomplete -Daily weights 205>>198 -ReDS reading--26 -started Sherryll Burger, Farxiga -d/c home with lasix 40 mg po daily   New onset afib -cardiology consult>>start amiodarone 200 bid x 3 weeks, then once daily -continue metoprolol succinate -CHADSVASc = 7 -started apixaban   Lobar pneumonia/hemoptysis -small volume hemoptysis -Check PCT<0.10 -Personally reviewed chest x-ray--right lower lobe opacity -Continue ceftriaxone and azithromycin -Check MRSA--neg -10/19 CTA chest neg for PE; trace bilateral pleural eff R>L.  Subpleural nodular densities bilateral LL and mild GGO RUL -anti-tussive med -d/c home with 2 more days cefdinir and doxy -Hgb remains stable even after starting apixaban   COPD -Started bronchodilators -Patient has about 20-pack-year history of tobacco, quit smoking 20 years ago   Essential hypertension -Continue metoprolol succinate -Discontinue hydrochlorothiazide/amlodipine -d/c lisinopril--never started during the hospitalization -Entresto started    CKD  Physician Discharge Summary   Patient: Joshua Compton. MRN: [id number] DOB: 03/22/1941  Admit date:     09/05/2023  Discharge date: 09/08/23  Discharge Physician: Onalee Hua Jermane Brayboy   PCP: [name], MD   Recommendations at discharge:   Please follow up with primary care provider within 1-2 weeks  Please repeat BMP and CBC in one week     Hospital Course: 82 year old male with a history of prostate cancer, hypertension, hyperlipidemia, prediabetes, stroke with right hemiparesis, GERD presenting with 2-day history of shortness of breath.  The patient has had some associated coughing with white-yellow sputum.  He is complaining of some generalized weakness and myalgias.  He denies any chest pain, nausea, vomiting, direct abdominal pain, dysuria, hematuria, headache, neck pain. The patient has noted some increasing lower extremity edema over the past week.  He has had orthopnea type symptoms.  He has had to sleep in a recliner for the past 2 weeks. Notably, the patient was recently admitted to the hospital from 08/07/2023 to 08/14/2023.  He was transferred to [facility] from [facility] for right-sided weakness and aphasia.  The patient was found to have an acute left frontal lobe infarct that was felt to be cryptogenic.  CTA of the head and neck was negative for LVO.  Echocardiogram showed EF 50-55%, no WMA, normal diastolic function, normal RV function, trace MR/TR, mild AI, no AS.  He was discharged to inpatient rehab where he stayed from 08/14/2023 to 08/24/2023.  He was discharged with aspirin 81 mg daily and Lipitor 40 mg daily for secondary prophylaxis.  He has since returned home to live with his daughter and spouse. In the ED, the patient was afebrile and hemodynamically stable.  Oxygen saturation was in the 70s on room air.  On 4 L nasal cannula oxygen saturation is 95%.  WBC 7.5, hemoglobin 12.9, platelets 173.  Sodium 139, potassium 4.1, bicarbonate 27, serum creatinine 1.05.  LFTs unremarkable.   COVID-19 PCR is negative.  EKG shows sinus rhythm with nonspecific T wave changes.  VBG showed 7.4 3/44/35/29.  Chest x-ray showed increased interstitial markings, right lower lobe infiltrate.  The patient was started on ceftriaxone, azithromycin, and given furosemide 40 mg IV x 1.  Assessment and Plan: Acute respiratory failure with hypoxia -VBG--7.43/44/35/29 -Secondary to pneumonia and pulmonary edema -Stable on 4 L nasal cannula>>2L -Wean oxygen for saturation greater 92% -09/08/23--ambulatory pulseox on RA>>85% -plan to d/c home on 2L   Acute HFrEF -08/07/2023 echo--EF 50-55%, no WMA, normal diastolic function, normal RV function, trace MR/TR, mild AI, no AS.  -continue IV furosemide 40 mg IV daily -09/07/23 limited echo EF 40-45%, global HK, mild-mod AI -Accurate I's and O's--imcomplete -Daily weights 205>>198 -ReDS reading--26 -started Sherryll Burger, Farxiga -d/c home with lasix 40 mg po daily   New onset afib -cardiology consult>>start amiodarone 200 bid x 3 weeks, then once daily -continue metoprolol succinate -CHADSVASc = 7 -started apixaban   Lobar pneumonia/hemoptysis -small volume hemoptysis -Check PCT<0.10 -Personally reviewed chest x-ray--right lower lobe opacity -Continue ceftriaxone and azithromycin -Check MRSA--neg -10/19 CTA chest neg for PE; trace bilateral pleural eff R>L.  Subpleural nodular densities bilateral LL and mild GGO RUL -anti-tussive med -d/c home with 2 more days cefdinir and doxy -Hgb remains stable even after starting apixaban   COPD -Started bronchodilators -Patient has about 20-pack-year history of tobacco, quit smoking 20 years ago   Essential hypertension -Continue metoprolol succinate -Discontinue hydrochlorothiazide/amlodipine -d/c lisinopril--never started during the hospitalization -Entresto started    CKD  Physician Discharge Summary   Patient: Joshua Compton. MRN: [id number] DOB: 03/22/1941  Admit date:     09/05/2023  Discharge date: 09/08/23  Discharge Physician: Onalee Hua Jermane Brayboy   PCP: [name], MD   Recommendations at discharge:   Please follow up with primary care provider within 1-2 weeks  Please repeat BMP and CBC in one week     Hospital Course: 82 year old male with a history of prostate cancer, hypertension, hyperlipidemia, prediabetes, stroke with right hemiparesis, GERD presenting with 2-day history of shortness of breath.  The patient has had some associated coughing with white-yellow sputum.  He is complaining of some generalized weakness and myalgias.  He denies any chest pain, nausea, vomiting, direct abdominal pain, dysuria, hematuria, headache, neck pain. The patient has noted some increasing lower extremity edema over the past week.  He has had orthopnea type symptoms.  He has had to sleep in a recliner for the past 2 weeks. Notably, the patient was recently admitted to the hospital from 08/07/2023 to 08/14/2023.  He was transferred to [facility] from [facility] for right-sided weakness and aphasia.  The patient was found to have an acute left frontal lobe infarct that was felt to be cryptogenic.  CTA of the head and neck was negative for LVO.  Echocardiogram showed EF 50-55%, no WMA, normal diastolic function, normal RV function, trace MR/TR, mild AI, no AS.  He was discharged to inpatient rehab where he stayed from 08/14/2023 to 08/24/2023.  He was discharged with aspirin 81 mg daily and Lipitor 40 mg daily for secondary prophylaxis.  He has since returned home to live with his daughter and spouse. In the ED, the patient was afebrile and hemodynamically stable.  Oxygen saturation was in the 70s on room air.  On 4 L nasal cannula oxygen saturation is 95%.  WBC 7.5, hemoglobin 12.9, platelets 173.  Sodium 139, potassium 4.1, bicarbonate 27, serum creatinine 1.05.  LFTs unremarkable.   COVID-19 PCR is negative.  EKG shows sinus rhythm with nonspecific T wave changes.  VBG showed 7.4 3/44/35/29.  Chest x-ray showed increased interstitial markings, right lower lobe infiltrate.  The patient was started on ceftriaxone, azithromycin, and given furosemide 40 mg IV x 1.  Assessment and Plan: Acute respiratory failure with hypoxia -VBG--7.43/44/35/29 -Secondary to pneumonia and pulmonary edema -Stable on 4 L nasal cannula>>2L -Wean oxygen for saturation greater 92% -09/08/23--ambulatory pulseox on RA>>85% -plan to d/c home on 2L   Acute HFrEF -08/07/2023 echo--EF 50-55%, no WMA, normal diastolic function, normal RV function, trace MR/TR, mild AI, no AS.  -continue IV furosemide 40 mg IV daily -09/07/23 limited echo EF 40-45%, global HK, mild-mod AI -Accurate I's and O's--imcomplete -Daily weights 205>>198 -ReDS reading--26 -started Sherryll Burger, Farxiga -d/c home with lasix 40 mg po daily   New onset afib -cardiology consult>>start amiodarone 200 bid x 3 weeks, then once daily -continue metoprolol succinate -CHADSVASc = 7 -started apixaban   Lobar pneumonia/hemoptysis -small volume hemoptysis -Check PCT<0.10 -Personally reviewed chest x-ray--right lower lobe opacity -Continue ceftriaxone and azithromycin -Check MRSA--neg -10/19 CTA chest neg for PE; trace bilateral pleural eff R>L.  Subpleural nodular densities bilateral LL and mild GGO RUL -anti-tussive med -d/c home with 2 more days cefdinir and doxy -Hgb remains stable even after starting apixaban   COPD -Started bronchodilators -Patient has about 20-pack-year history of tobacco, quit smoking 20 years ago   Essential hypertension -Continue metoprolol succinate -Discontinue hydrochlorothiazide/amlodipine -d/c lisinopril--never started during the hospitalization -Entresto started    CKD  08/14/41             BSA:          2.119 m Patient Age:    82 years              BP:           140/60 mmHg Patient Gender: M                     HR:           64 bpm. [facility] Procedure: Limited Echo, Cardiac Doppler, Color Doppler and Intracardiac            Opacification Agent Indications:    I50.40* Unspecified combined systolic (congestive) and diastolic                 (congestive) heart failure  History:        Patient has no prior history of Echocardiogram examinations.                 CHF, Stroke, Signs/Symptoms:Edema, Dyspnea and Shortness of                 Breath; Risk Factors:Hypertension. Patient had echo at another                 health system. Cancer.  Sonographer:    Sheralyn Boatman RDCS Referring Phys: [phone] Lyndsay Talamante  Sonographer Comments: Technically difficult study due to poor echo windows and Technically challenging study due to limited acoustic windows. Extremely difficult imges. IMPRESSIONS  1. Left ventricular ejection fraction, by estimation, is 40 to 45%. The left ventricle has mildly decreased function. The left ventricle demonstrates global hypokinesis. The left ventricular internal cavity size was moderately dilated.  2.  The mitral valve is grossly normal. No evidence of mitral valve regurgitation.  3. The aortic valve is calcified. Aortic valve regurgitation is mild to moderate. Aortic valve sclerosis is present, with no evidence of aortic valve stenosis. FINDINGS  Left Ventricle: Left ventricular ejection fraction, by estimation, is 40 to 45%. The left ventricle has mildly decreased function. The left ventricle demonstrates global hypokinesis. Definity contrast agent was given IV to delineate the left ventricular  endocardial borders. The left ventricular internal cavity size was moderately dilated. Pericardium: There is no evidence of pericardial effusion. Mitral Valve: The mitral valve is grossly normal. Aortic Valve: The aortic valve is calcified. Aortic valve regurgitation is mild to moderate. Aortic regurgitation PHT measures 616 msec. Aortic valve sclerosis is present, with no evidence of aortic valve stenosis. Aortic valve mean gradient measures 4.0  mmHg. Aortic valve peak gradient measures 8.5 mmHg. IAS/Shunts: The interatrial septum was not well visualized. Additional Comments: Spectral Doppler performed. Color Doppler performed.  LEFT VENTRICLE PLAX 2D LVIDd:         5.30 cm LVIDs:         4.60 cm LV PW:         1.00 cm LV IVS:        0.90 cm  LV Volumes (MOD) LV vol d, MOD A2C: 174.0 ml LV vol d, MOD A4C: 193.0 ml LV vol s, MOD A2C: 114.0 ml LV vol s, MOD A4C: 99.6 ml LV SV MOD A2C:     60.0 ml LV SV MOD A4C:     193.0 ml LV SV MOD BP:      77.4 ml IVC IVC diam: 1.80 cm AORTIC VALVE AV Vmax:  08/14/41             BSA:          2.119 m Patient Age:    82 years              BP:           140/60 mmHg Patient Gender: M                     HR:           64 bpm. [facility] Procedure: Limited Echo, Cardiac Doppler, Color Doppler and Intracardiac            Opacification Agent Indications:    I50.40* Unspecified combined systolic (congestive) and diastolic                 (congestive) heart failure  History:        Patient has no prior history of Echocardiogram examinations.                 CHF, Stroke, Signs/Symptoms:Edema, Dyspnea and Shortness of                 Breath; Risk Factors:Hypertension. Patient had echo at another                 health system. Cancer.  Sonographer:    Sheralyn Boatman RDCS Referring Phys: [phone] Lyndsay Talamante  Sonographer Comments: Technically difficult study due to poor echo windows and Technically challenging study due to limited acoustic windows. Extremely difficult imges. IMPRESSIONS  1. Left ventricular ejection fraction, by estimation, is 40 to 45%. The left ventricle has mildly decreased function. The left ventricle demonstrates global hypokinesis. The left ventricular internal cavity size was moderately dilated.  2.  The mitral valve is grossly normal. No evidence of mitral valve regurgitation.  3. The aortic valve is calcified. Aortic valve regurgitation is mild to moderate. Aortic valve sclerosis is present, with no evidence of aortic valve stenosis. FINDINGS  Left Ventricle: Left ventricular ejection fraction, by estimation, is 40 to 45%. The left ventricle has mildly decreased function. The left ventricle demonstrates global hypokinesis. Definity contrast agent was given IV to delineate the left ventricular  endocardial borders. The left ventricular internal cavity size was moderately dilated. Pericardium: There is no evidence of pericardial effusion. Mitral Valve: The mitral valve is grossly normal. Aortic Valve: The aortic valve is calcified. Aortic valve regurgitation is mild to moderate. Aortic regurgitation PHT measures 616 msec. Aortic valve sclerosis is present, with no evidence of aortic valve stenosis. Aortic valve mean gradient measures 4.0  mmHg. Aortic valve peak gradient measures 8.5 mmHg. IAS/Shunts: The interatrial septum was not well visualized. Additional Comments: Spectral Doppler performed. Color Doppler performed.  LEFT VENTRICLE PLAX 2D LVIDd:         5.30 cm LVIDs:         4.60 cm LV PW:         1.00 cm LV IVS:        0.90 cm  LV Volumes (MOD) LV vol d, MOD A2C: 174.0 ml LV vol d, MOD A4C: 193.0 ml LV vol s, MOD A2C: 114.0 ml LV vol s, MOD A4C: 99.6 ml LV SV MOD A2C:     60.0 ml LV SV MOD A4C:     193.0 ml LV SV MOD BP:      77.4 ml IVC IVC diam: 1.80 cm AORTIC VALVE AV Vmax:

## 2023-09-08 NOTE — Care Management Important Message (Signed)
Important Message  Patient Details  Name: Joshua Compton. MRN: [id number] Date of Birth: 17-Sep-1941   Important Message Given:  Yes - Medicare IM     Corey Harold 09/08/2023, 10:52 AM

## 2023-09-08 NOTE — Progress Notes (Signed)
Physical Therapy Treatment Patient Details Name: Joshua Compton. MRN: [id number] DOB: [birth year] Today's Date: 09/08/2023   History of Present Illness Mylon Manalac. is a 82 year old male with a history of prostate cancer, hypertension, hyperlipidemia, prediabetes, stroke with right hemiparesis, GERD presenting with 2-day history of shortness of breath.  The patient has had some associated coughing with white-yellow sputum.  He is complaining of some generalized weakness and myalgias.  He denies any chest pain, nausea, vomiting, direct abdominal pain, dysuria, hematuria, headache, neck pain.  The patient has noted some increasing lower extremity edema over the past week.  He has had orthopnea type symptoms.  He has had to sleep in a recliner for the past 2 weeks.  Notably, the patient was recently admitted to the hospital from 08/07/2023 to 08/14/2023.  He was transferred to [facility] from [facility] for right-sided weakness and aphasia.  The patient was found to have an acute left frontal lobe infarct that was felt to be cryptogenic    PT Comments  Pt supine in bed with family present and willing to participate.  Pt able to ambulate 221ft no AD and no LOB episodes.  Pt limited with O2 saturation decreased with exertion.  RN was trying pt on room at therapist's entrance at 86% on room air.  Returned to 97% on 1L via nasal canal and gait training complete.  Upon return decreased O2 sat to 83%.  Returned to 2L O2 with saturation levels at 94%.  Pt left sitting on EOB with wife present.  RN aware of status.   If plan is discharge home, recommend the following:     Can travel by private vehicle        Equipment Recommendations       Recommendations for Other Services       Precautions / Restrictions Precautions Precautions: None     Mobility  Bed Mobility Overal bed mobility: Independent                  Transfers Overall transfer level: Independent                  General transfer comment: On room air: supine to sit I at 86%, 1L O2 A via nasal canal returned to 97%, gait 271ft no AD, O2 sat decreased to 83%, returned to 2L: 94%    Ambulation/Gait Ambulation/Gait assistance: Independent Gait Distance (Feet): 200 Feet Assistive device: None Gait Pattern/deviations: Decreased step length - right, Decreased step length - left Gait velocity: WFL     General Gait Details: Ambulated 214ft no AD, safe mechanics with no LOB with 1L O2 A; decreased O2 saturation to 83%; returned to 2L with O2 sat at 94%.   Stairs             Wheelchair Mobility     Tilt Bed    Modified Rankin (Stroke Patients Only)       Balance                                            Cognition Arousal: Alert Behavior During Therapy: WFL for tasks assessed/performed Overall Cognitive Status: Within Functional Limits for tasks assessed  Exercises      General Comments        Pertinent Vitals/Pain Pain Assessment Pain Assessment: No/denies pain    Home Living                          Prior Function            PT Goals (current goals can now be found in the care plan section)      Frequency           PT Plan      Co-evaluation              AM-PAC PT "6 Clicks" Mobility   Outcome Measure  Help needed turning from your back to your side while in a flat bed without using bedrails?: None Help needed moving from lying on your back to sitting on the side of a flat bed without using bedrails?: None Help needed moving to and from a bed to a chair (including a wheelchair)?: None Help needed standing up from a chair using your arms (e.g., wheelchair or bedside chair)?: None Help needed to walk in hospital room?: None Help needed climbing 3-5 steps with a railing? : A Little 6 Click Score: 23    End of Session Equipment Utilized During Treatment:  Oxygen Activity Tolerance: Patient tolerated treatment well Patient left: in chair;with call bell/phone within reach;with family/visitor present Nurse Communication: Mobility status PT Visit Diagnosis: Unsteadiness on feet (R26.81)     Time: 4098-1191 PT Time Calculation (min) (ACUTE ONLY): 23 min  Charges:    $Therapeutic Activity: 23-37 mins PT General Charges $$ ACUTE PT VISIT: 1 Visit                    Becky Sax, LPTA/CLT; CBIS [phone]  Juel Burrow 09/08/2023, 10:02 AM

## 2023-09-08 NOTE — Progress Notes (Addendum)
Patient Name: Joshua Compton. Date of Encounter: 09/08/2023 [facility] Cardiologist: None    Interval Summary  .    Breathing improved but still on O2. No history of chest pain, spoke with daughter. Hoping to go home.  Vital Signs .    Vitals:   09/07/23 1006 09/07/23 1502 09/07/23 2057 09/08/23 0406  BP: 121/78 122/69 (!) 114/46 125/65  Pulse: 90 89 63 92  Resp: 18     Temp: 98.2 F (36.8 C) 98.1 F (36.7 C) 98 F (36.7 C) 97.8 F (36.6 C)  TempSrc: Oral Oral Oral Oral  SpO2: 98% 100% 96% 97%  Weight:    88.5 kg  Height:       No intake or output data in the 24 hours ending 09/08/23 0817    09/08/2023    4:06 AM 09/07/2023    5:00 AM 09/06/2023    5:00 AM  Last 3 Weights  Weight (lbs) 195 lb 1.7 oz 197 lb 8.5 oz 197 lb 15.6 oz  Weight (kg) 88.5 kg 89.6 kg 89.8 kg      Telemetry/ECG    Afib converted to NSR - Personally Reviewed  Physical Exam .   GEN: No acute distress.   Neck: No JVD Cardiac:  RRR, no murmurs, rubs, or gallops.  Respiratory: decreased breath sounds right lung base GI: Soft, nontender, non-distended  MS: No edema LE, decreased edema RUE  Assessment & Plan .      Afib with some RVR converted to NSR-new diagnosis for patient and recent CVA- started on Eliquis yesterday,for now leave on ASA 81 mg daily per Dr. Eden Emms,   toprol xl 50 mg daily and   Amiodaorone 200 mg bid started yest. Plan 30 day monitor at discharge.   CHF with normal LVEF on echo 07/2023-limited echo yesterday LVEF 40-45% global HK. Suspect secondary to Afib but can't rule out ischemia with severe left main and 3 vessel CAD on CT.Marland Kitchen No history of chest pain. Can workup as outpatient with recent CVA. I/O's positive 1.3 L but edema down and patient says staff isn't measuring I/O's. On IV lasix 40 mg daily. BNP 388. Would transition to Lasix 20-40 mg po daily.Consider adding entresto.    Acute resp failure with hypoxia due to CHF & Pneumonia per primary team    CVA 07/2023 treated with TKN and sent home on ASA/statin.   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   Right upper arm swelling since CVA- venous dopplers negative DVT   HTN   COPD quit smoking 20 yrs ago   CKD3 Crt 1.13  For questions or updates, please contact [facility] Please consult [email or web address] for contact info under        Signed, Jacolyn Reedy, PA-C   Attending note  Patient seen and discussed with PA Geni Bers, I agree with her documentation  1.PAF - new diagnosis this admission - patient with recent CVA, afib possible related - started on eliquis 5mg  bid - on toprol 50mg  daily, oral amio 200mg  bid started this admission to try to maintain SR in setting of declining LVEF. Plan for 3 weeks amio 200mg  bid then 200mg  daily.  - plans for 30 day monitor at discharge to evaluate afib burden and rate control in setting of CVA and declining LV function.    2.Acute HFrEF - 07/2023 echo [facility]: LVE 55-60%, mild AI - 08/2023 echo this admit: LVEF  40-45% - drop in LVEF in setting of newly diagnosed afib, perhaps tachy mediated. Also with pneumonia this admission, possible stress induced CM. CT chest this admit did show severe LM and 3 vessel disease as well, risk for ischemic CM. Trops are negative so not an ACS presentation for his drop in LVEF. Would let him recover from recent stroke prior to considering an ischemic evaluation.   -CT trace effusions, BNP 388,  - I/Os are incomplete. Reported weights 203--->195 lbs this admission. Has been on IV lasix 40mg  daily, slight uptrend in Cr but still WNL. 09/06/23 reds vest 26%. - appears euvolemic, change to oral lasix 40mg  daily  - medical therapy with toprol 50mg  daily. Add entresto 24/26mg  bid, farxiga 10mg  daily. Would add MRA at f/u since plans for discharge today.      3. History of CVA - admit 07/2023 at [facility] with ccute to subacute  infarct within the posterior aspect of the left frontal lobe. Discharged on ASA/statin -started on eliquis  in setting of newly diagnosed afib this admission - defer to neuro if aspirin needed in addition to eliquis with his CVA last month   4. Pneumonia/COPD - abx per primary team  He really wants to go home today, reasonable from cardiac standpoint. Cardiac meds as described above, we will arrange outpatient f/u. Would need bmet/mg arranged at f/u. We will sign off inpatient care.       Dina Rich MD

## 2023-09-08 NOTE — Progress Notes (Signed)
Patient discharged home with instructions given on medications and follow up visits,verbalized understanding .Prescriptions sent to Pharmacy of choice documented on AVS.IV discontinued,catheter intact. Accompanied by staff to an awaiting vehicle.

## 2023-09-08 NOTE — Progress Notes (Signed)
SATURATION QUALIFICATIONS: (This note is used to comply with regulatory documentation for home oxygen)  Patient Saturations on Room Air at Rest = 86%  Patient Saturations on Room Air while Ambulating = 86%  Patient Saturations on 2 Liters of oxygen while Ambulating = 93%  Please briefly explain why patient needs home oxygen:

## 2023-09-09 ENCOUNTER — Telehealth: Payer: Self-pay

## 2023-09-09 NOTE — Transitions of Care (Post Inpatient/ED Visit) (Signed)
09/09/2023  Name: Joshua Compton. MRN: [id number] DOB: 1941-03-30  Today's TOC FU Call Status: Today's TOC FU Call Status:: Unsuccessful Call (1st Attempt) Unsuccessful Call (1st Attempt) Date: 09/09/23  Attempted to reach the patient regarding the most recent Inpatient/ED visit.  Follow Up Plan: Additional outreach attempts will be made to reach the patient to complete the Transitions of Care (Post Inpatient/ED visit) call.   Jodelle Gross RN, BSN, CCM RN Care Manager  Transitions of Care  [facility] - [facility]  [phone]

## 2023-09-10 DIAGNOSIS — Z7982 Long term (current) use of aspirin: Secondary | ICD-10-CM | POA: Diagnosis not present

## 2023-09-10 DIAGNOSIS — I69391 Dysphagia following cerebral infarction: Secondary | ICD-10-CM | POA: Diagnosis not present

## 2023-09-10 DIAGNOSIS — I69322 Dysarthria following cerebral infarction: Secondary | ICD-10-CM | POA: Diagnosis not present

## 2023-09-10 DIAGNOSIS — I10 Essential (primary) hypertension: Secondary | ICD-10-CM | POA: Diagnosis not present

## 2023-09-10 DIAGNOSIS — E785 Hyperlipidemia, unspecified: Secondary | ICD-10-CM | POA: Diagnosis not present

## 2023-09-10 DIAGNOSIS — N179 Acute kidney failure, unspecified: Secondary | ICD-10-CM | POA: Diagnosis not present

## 2023-09-10 DIAGNOSIS — I69328 Other speech and language deficits following cerebral infarction: Secondary | ICD-10-CM | POA: Diagnosis not present

## 2023-09-10 DIAGNOSIS — I69320 Aphasia following cerebral infarction: Secondary | ICD-10-CM | POA: Diagnosis not present

## 2023-09-10 DIAGNOSIS — I69351 Hemiplegia and hemiparesis following cerebral infarction affecting right dominant side: Secondary | ICD-10-CM | POA: Diagnosis not present

## 2023-09-14 ENCOUNTER — Ambulatory Visit (INDEPENDENT_AMBULATORY_CARE_PROVIDER_SITE_OTHER): Payer: Medicare HMO

## 2023-09-14 DIAGNOSIS — I69351 Hemiplegia and hemiparesis following cerebral infarction affecting right dominant side: Secondary | ICD-10-CM | POA: Diagnosis not present

## 2023-09-14 DIAGNOSIS — I69322 Dysarthria following cerebral infarction: Secondary | ICD-10-CM | POA: Diagnosis not present

## 2023-09-14 DIAGNOSIS — I69320 Aphasia following cerebral infarction: Secondary | ICD-10-CM | POA: Diagnosis not present

## 2023-09-14 DIAGNOSIS — I69328 Other speech and language deficits following cerebral infarction: Secondary | ICD-10-CM | POA: Diagnosis not present

## 2023-09-14 DIAGNOSIS — E785 Hyperlipidemia, unspecified: Secondary | ICD-10-CM | POA: Diagnosis not present

## 2023-09-14 DIAGNOSIS — I69391 Dysphagia following cerebral infarction: Secondary | ICD-10-CM

## 2023-09-14 DIAGNOSIS — I10 Essential (primary) hypertension: Secondary | ICD-10-CM | POA: Diagnosis not present

## 2023-09-14 DIAGNOSIS — N179 Acute kidney failure, unspecified: Secondary | ICD-10-CM | POA: Diagnosis not present

## 2023-09-15 DIAGNOSIS — I69322 Dysarthria following cerebral infarction: Secondary | ICD-10-CM | POA: Diagnosis not present

## 2023-09-15 DIAGNOSIS — N179 Acute kidney failure, unspecified: Secondary | ICD-10-CM | POA: Diagnosis not present

## 2023-09-15 DIAGNOSIS — I69320 Aphasia following cerebral infarction: Secondary | ICD-10-CM | POA: Diagnosis not present

## 2023-09-15 DIAGNOSIS — I69391 Dysphagia following cerebral infarction: Secondary | ICD-10-CM | POA: Diagnosis not present

## 2023-09-15 DIAGNOSIS — I69328 Other speech and language deficits following cerebral infarction: Secondary | ICD-10-CM | POA: Diagnosis not present

## 2023-09-15 DIAGNOSIS — E785 Hyperlipidemia, unspecified: Secondary | ICD-10-CM | POA: Diagnosis not present

## 2023-09-15 DIAGNOSIS — Z7982 Long term (current) use of aspirin: Secondary | ICD-10-CM | POA: Diagnosis not present

## 2023-09-15 DIAGNOSIS — I69351 Hemiplegia and hemiparesis following cerebral infarction affecting right dominant side: Secondary | ICD-10-CM | POA: Diagnosis not present

## 2023-09-15 DIAGNOSIS — I10 Essential (primary) hypertension: Secondary | ICD-10-CM | POA: Diagnosis not present

## 2023-09-15 NOTE — Consult Note (Signed)
[facility] Liaison Note  09/15/2023  Gwynn Bernstein. [birth year] [id number]  Location: screened the patient remotely at [facility] ED.  Insurance: [facility] Ad[employer]age   Eulis Ahlquist. is a 82 y.o. male who is a Primary Care Patient of Stacks, Broadus John, MD The patient was screened for readmission hospitalization with noted high risk score for unplanned readmission risk with 1 IP in 6 months.  The patient was assessed for potential Care Management service needs for post hospital transition for care coordination. Review of patient's electronic medical record reveals patient was admitted with Pneumonia. TOC via [facility] has reached out to pt on 09/09/23 for post hospital readmission prevention and possibly care management services if appropriate.     [facility] Care Management/Population Health does not replace or interfere with any arrangements made by the Inpatient Transition of Care team.   For questions contact:   Elliot Cousin, RN, [facility] Liaison [place]   [facility], Population Health Office Hours MTWF  8:00 am-6:00 pm Direct Dial: [phone] mobile [phone] [Office toll free line] Office Hours are M-F 8:30 - 5 pm [email or web address]

## 2023-09-16 ENCOUNTER — Encounter: Payer: Medicare HMO | Admitting: Vascular Surgery

## 2023-09-16 ENCOUNTER — Ambulatory Visit (HOSPITAL_COMMUNITY): Payer: Medicare HMO

## 2023-09-16 DIAGNOSIS — Z8673 Personal history of transient ischemic attack (TIA), and cerebral infarction without residual deficits: Secondary | ICD-10-CM | POA: Diagnosis not present

## 2023-09-16 DIAGNOSIS — Z1330 Encounter for screening examination for mental health and behavioral disorders, unspecified: Secondary | ICD-10-CM | POA: Diagnosis not present

## 2023-09-16 DIAGNOSIS — I69351 Hemiplegia and hemiparesis following cerebral infarction affecting right dominant side: Secondary | ICD-10-CM | POA: Diagnosis not present

## 2023-09-16 DIAGNOSIS — Z9189 Other specified personal risk factors, not elsewhere classified: Secondary | ICD-10-CM | POA: Diagnosis not present

## 2023-09-16 DIAGNOSIS — R7303 Prediabetes: Secondary | ICD-10-CM | POA: Diagnosis not present

## 2023-09-16 DIAGNOSIS — I4891 Unspecified atrial fibrillation: Secondary | ICD-10-CM | POA: Diagnosis not present

## 2023-09-16 DIAGNOSIS — I10 Essential (primary) hypertension: Secondary | ICD-10-CM | POA: Diagnosis not present

## 2023-09-16 DIAGNOSIS — E7800 Pure hypercholesterolemia, unspecified: Secondary | ICD-10-CM | POA: Diagnosis not present

## 2023-09-17 DIAGNOSIS — I69351 Hemiplegia and hemiparesis following cerebral infarction affecting right dominant side: Secondary | ICD-10-CM | POA: Diagnosis not present

## 2023-09-17 DIAGNOSIS — I10 Essential (primary) hypertension: Secondary | ICD-10-CM | POA: Diagnosis not present

## 2023-09-17 DIAGNOSIS — I69322 Dysarthria following cerebral infarction: Secondary | ICD-10-CM | POA: Diagnosis not present

## 2023-09-17 DIAGNOSIS — I69391 Dysphagia following cerebral infarction: Secondary | ICD-10-CM | POA: Diagnosis not present

## 2023-09-17 DIAGNOSIS — I69328 Other speech and language deficits following cerebral infarction: Secondary | ICD-10-CM | POA: Diagnosis not present

## 2023-09-17 DIAGNOSIS — N179 Acute kidney failure, unspecified: Secondary | ICD-10-CM | POA: Diagnosis not present

## 2023-09-17 DIAGNOSIS — E785 Hyperlipidemia, unspecified: Secondary | ICD-10-CM | POA: Diagnosis not present

## 2023-09-17 DIAGNOSIS — Z7982 Long term (current) use of aspirin: Secondary | ICD-10-CM | POA: Diagnosis not present

## 2023-09-17 DIAGNOSIS — I69320 Aphasia following cerebral infarction: Secondary | ICD-10-CM | POA: Diagnosis not present

## 2023-09-18 ENCOUNTER — Encounter: Payer: Self-pay | Admitting: Family Medicine

## 2023-09-18 ENCOUNTER — Ambulatory Visit (INDEPENDENT_AMBULATORY_CARE_PROVIDER_SITE_OTHER): Payer: Medicare HMO | Admitting: Family Medicine

## 2023-09-18 VITALS — BP 144/56 | HR 48 | Temp 97.4°F | Ht 73.0 in | Wt 199.0 lb

## 2023-09-18 DIAGNOSIS — I5021 Acute systolic (congestive) heart failure: Secondary | ICD-10-CM

## 2023-09-18 DIAGNOSIS — J189 Pneumonia, unspecified organism: Secondary | ICD-10-CM

## 2023-09-18 DIAGNOSIS — N289 Disorder of kidney and ureter, unspecified: Secondary | ICD-10-CM | POA: Diagnosis not present

## 2023-09-18 DIAGNOSIS — Z9981 Dependence on supplemental oxygen: Secondary | ICD-10-CM

## 2023-09-18 DIAGNOSIS — Z79899 Other long term (current) drug therapy: Secondary | ICD-10-CM | POA: Diagnosis not present

## 2023-09-18 DIAGNOSIS — E782 Mixed hyperlipidemia: Secondary | ICD-10-CM | POA: Diagnosis not present

## 2023-09-18 DIAGNOSIS — I63412 Cerebral infarction due to embolism of left middle cerebral artery: Secondary | ICD-10-CM | POA: Diagnosis not present

## 2023-09-18 DIAGNOSIS — J9601 Acute respiratory failure with hypoxia: Secondary | ICD-10-CM | POA: Diagnosis not present

## 2023-09-18 DIAGNOSIS — E559 Vitamin D deficiency, unspecified: Secondary | ICD-10-CM

## 2023-09-18 DIAGNOSIS — I480 Paroxysmal atrial fibrillation: Secondary | ICD-10-CM | POA: Diagnosis not present

## 2023-09-18 NOTE — Progress Notes (Signed)
Subjective:  Patient ID: Joshua Najjar., male    DOB: 02-27-1941, 82 y.o.   MRN: [id number]  Patient Care Team: [name], MD as PCP - General (Family Medicine) Wendall Stade, MD as PCP - Cardiology (Cardiology) Charna Elizabeth, MD as Consulting Physician (Gastroenterology) Shirlean Kelly, MD as Consulting Physician (Neurosurgery) Bjorn Pippin, MD as Attending Physician (Urology) Mateo Flow, MD as Consulting Physician (Ophthalmology) Bjorn Pippin, MD as Attending Physician (Urology)   Chief Complaint:  Hospitalization Follow-up (09/05/2023 - 09/08/2023 (3 days)/[facility]- Acute respiratory failure with hypoxia)   HPI: Joshua Compton. is a 82 y.o. male presenting on 09/18/2023 for Hospitalization Follow-up (09/05/2023 - 09/08/2023 (3 days)/[facility]- Acute respiratory failure with hypoxia)   Discussed the use of AI scribe software for clinical note transcription with the patient, who gave verbal consent to proceed.  History of Present Illness   Joshua Compton presents today for hospital discharge follow up. He was admitted to [facility] in 07/2023 for acute stroke. After discharge home from rehab follow the stroke he developed shortness of breath and fever which prompted a visit the the ED at [facility]. He was noted to be in acute heart failure, acute respiratory failure, A-Fib, and had CAP of the right lung. He has been home for five days following a hospital admission for pneumonia. The patient has been receiving home therapy for arm mobility and speech. The patient's daughter reports that the patient's weight has been fluctuating since discharge, with a recent increase of 8 pounds. However, the accuracy of his home scale is in question. The patient denies any palpitations or abnormal bleeding or bruising.  The patient's medication regimen, including Entresto for heart failure and amiodarone for atrial fibrillation, has remained unchanged since the recent  hospital discharge.          Relevant past medical, surgical, family, and social history reviewed and updated as indicated.  Allergies and medications reviewed and updated. Data reviewed: Chart in Epic.   Past Medical History:  Diagnosis Date   Arthritis    Cancer (HCC)    prostate ca   seed implants   GERD (gastroesophageal reflux disease)    Glaucoma    Hyperlipidemia    Hypertension    dr Ruben Im   fm    Past Surgical History:  Procedure Laterality Date   BACK SURGERY     x2   COLONOSCOPY     HERNIA REPAIR  11-06-2009   left inguinal repair    NECK SURGERY     ROTATOR CUFF REPAIR Left jan 2016   TOTAL HIP ARTHROPLASTY Right 04/20/2020   Procedure: RIGHT TOTAL HIP ARTHROPLASTY ANTERIOR APPROACH;  Surgeon: Tarry Kos, MD;  Location: [facility] OR;  Service: Orthopedics;  Laterality: Right;   TOTAL HIP ARTHROPLASTY Left 01/07/2021   Procedure: LEFT TOTAL HIP ARTHROPLASTY ANTERIOR APPROACH;  Surgeon: Tarry Kos, MD;  Location: [facility] OR;  Service: Orthopedics;  Laterality: Left;   TRANSURETHRAL RESECTION OF PROSTATE      Social History   Socioeconomic History   Marital status: Married    Spouse name: Not on file   Number of children: 5   Years of education: 11   Highest education level: 11th grade  Occupational History   Occupation: Nutritional therapist    Comment: retired  Tobacco Use   Smoking status: Former    Current packs/day: 0.00    Average packs/day: 0.5 packs/day for 30.0 years (15.0 ttl  pk-yrs)    Types: Cigarettes    Start date: 03/29/1975    Quit date: 03/28/2005    Years since quitting: 18.4   Smokeless tobacco: Never  Vaping Use   Vaping status: Never Used  Substance and Sexual Activity   Alcohol use: No    Alcohol/week: 0.0 standard drinks of alcohol   Drug use: No   Sexual activity: Yes  Other Topics Concern   Not on file  Social History Narrative   Grandson lives with them - no stairs   Social Determinants of Health   Financial  Resource Strain: Low Risk  (09/16/2023)   Received from [facility]   Overall Financial Resource Strain (CARDIA)    Difficulty of Paying Living Expenses: Not hard at all  Food Insecurity: No Food Insecurity (09/16/2023)   Received from [facility]   Hunger Vital Sign    Worried About Running Out of Food in the Last Year: Never true    Ran Out of Food in the Last Year: Never true  Transportation Needs: No Transportation Needs (09/16/2023)   Received [facility] - [facility]    Lack of Transportation (Medical): No    Lack of Transportation (Non-Medical): No  Physical Activity: Sufficiently Active (01/14/2023)   Exercise Vital Sign    Days of Exercise per Week: 5 days    Minutes of Exercise per Session: 30 min  Stress: Patient Unable To Answer (08/07/2023)   Received from [facility] - Occupational Stress Questionnaire    Feeling of Stress : Patient unable to answer  Social Connections: Unknown (08/06/2023)   Received from [facility]   Social Network    Social Network: Not on file  Intimate Partner Violence: Not At Risk (09/05/2023)   Humiliation, Afraid, Rape, and Kick questionnaire    Fear of Current or Ex-Partner: No    Emotionally Abused: No    Physically Abused: No    Sexually Abused: No    Outpatient Encounter Medications as of 09/18/2023  Medication Sig   albuterol (VENTOLIN HFA) 108 (90 Base) MCG/ACT inhaler Inhale 1-2 puffs into the lungs every 6 (six) hours as needed for wheezing or shortness of breath.   amiodarone (PACERONE) 200 MG tablet Take 1 tablet (200 mg total) by mouth 2 (two) times daily. X 20 days, then once daily starting 09/28/23   apixaban (ELIQUIS) 5 MG TABS tablet Take 1 tablet (5 mg total) by mouth 2 (two) times daily.   atorvastatin (LIPITOR) 40 MG tablet Take 40 mg by mouth at bedtime.   cholecalciferol (VITAMIN D3) 25 MCG (1000 UNIT) tablet Take 1,000 Units by mouth daily.    dapagliflozin propanediol (FARXIGA) 10 MG TABS tablet Take 1 tablet (10 mg total) by mouth daily.   dorzolamide-timolol (COSOPT) 22.3-6.8 MG/ML ophthalmic solution Place 1 drop into both eyes daily.    fluticasone (FLONASE) 50 MCG/ACT nasal spray Place 2 sprays into both nostrils daily.   furosemide (LASIX) 40 MG tablet Take 1 tablet (40 mg total) by mouth daily.   metoprolol succinate (TOPROL-XL) 50 MG 24 hr tablet TAKE 1 TABLET (50 MG TOTAL) BY MOUTH DAILY. FOR BLOOD PRESSURE CONTROL   pantoprazole (PROTONIX) 40 MG tablet Take 1 tablet (40 mg total) by mouth daily. For heartburn   rOPINIRole (REQUIP) 1 MG tablet Take 1 tablet (1 mg total) by mouth 3 (three) times daily. Plus an extra tablet at bedtime to total four a day   sacubitril-valsartan (  ENTRESTO) 24-26 MG Take 1 tablet by mouth 2 (two) times daily.   [DISCONTINUED] ergocalciferol (VITAMIN D2) 1.25 MG (50000 UT) capsule Take 50,000 Units by mouth once a week. Takes every Wednesday   [DISCONTINUED] cefdinir (OMNICEF) 300 MG capsule Take 1 capsule (300 mg total) by mouth every 12 (twelve) hours.   [DISCONTINUED] doxycycline (VIBRA-TABS) 100 MG tablet Take 1 tablet (100 mg total) by mouth every 12 (twelve) hours.   No facility-administered encounter medications on file as of 09/18/2023.    Allergies  Allergen Reactions   Iodinated Contrast Media Anaphylaxis    Tongue swelled up when he had MRI in [facility]  Intubated for airway protection after oral and throat swelling   Multihance [Gadobenate] Swelling    Tongue swelled up when he had MRI in [facility]   Pravastatin Other (See Comments)    Muscle aches   Pravastatin Sodium     Muscle aches    Pertinent ROS per HPI, otherwise unremarkable      Objective:  BP (!) 144/56   Pulse (!) 48   Temp (!) 97.4 F (36.3 C) (Temporal)   Ht 6\' 1"  (1.854 m)   Wt 199 lb (90.3 kg)   SpO2 92%   BMI 26.25 kg/m    Wt Readings from Last 3 Encounters:  09/18/23 199 lb (90.3 kg)   09/08/23 195 lb 1.7 oz (88.5 kg)  07/28/23 199 lb (90.3 kg)    Physical Exam Vitals and nursing note reviewed.  Constitutional:      General: He is not in acute distress.    Appearance: Normal appearance. He is not ill-appearing, toxic-appearing or diaphoretic.  HENT:     Head: Normocephalic and atraumatic.     Nose: Nose normal.     Mouth/Throat:     Mouth: Mucous membranes are moist.     Pharynx: Oropharynx is clear.  Eyes:     Conjunctiva/sclera: Conjunctivae normal.     Pupils: Pupils are equal, round, and reactive to light.  Cardiovascular:     Rate and Rhythm: Normal rate. Rhythm irregularly irregular.     Heart sounds: Normal heart sounds. No murmur heard.    No S3 sounds.  Pulmonary:     Effort: Pulmonary effort is normal.     Breath sounds: Examination of the right-lower field reveals rhonchi. Rhonchi (minimal, clears with cough) present.     Comments: O2 via [place] Musculoskeletal:     Cervical back: Neck supple.     Right lower leg: 1+ Edema present.     Left lower leg: 1+ Edema present.  Skin:    General: Skin is warm and dry.     Capillary Refill: Capillary refill takes less than 2 seconds.  Neurological:     Mental Status: He is alert.     Cranial Nerves: Dysarthria and facial asymmetry present.     Motor: Weakness (RUE) present.  Psychiatric:        Mood and Affect: Mood normal.        Behavior: Behavior normal.    Physical Exam   VITALS: SaO2 94-97% MEASUREMENTS: WT 199 lbs EXTREMITIES: Edema in feet MUSCULOSKELETAL: Grip strength present, able to raise arm slightly NEUROLOGICAL: Facial asymmetry, memory intact        Results for orders placed or performed during the hospital encounter of 09/05/23  Resp panel by RT-PCR (RSV, Flu A&B, Covid) Anterior Nasal Swab   Specimen: Anterior Nasal Swab  Result Value Ref Range   SARS Coronavirus 2 by RT  PCR NEGATIVE NEGATIVE   Influenza A by PCR NEGATIVE NEGATIVE   Influenza B by PCR NEGATIVE NEGATIVE    Resp Syncytial Virus by PCR NEGATIVE NEGATIVE  MRSA Next Gen by PCR, Nasal   Specimen: Nasal Mucosa; Nasal Swab  Result Value Ref Range   MRSA by PCR Next Gen NOT DETECTED NOT DETECTED  Respiratory (~20 pathogens) panel by PCR   Specimen: Nasopharyngeal Swab; Respiratory  Result Value Ref Range   Adenovirus NOT DETECTED NOT DETECTED   Coronavirus 229E NOT DETECTED NOT DETECTED   Coronavirus HKU1 NOT DETECTED NOT DETECTED   Coronavirus NL63 NOT DETECTED NOT DETECTED   Coronavirus OC43 NOT DETECTED NOT DETECTED   Metapneumovirus NOT DETECTED NOT DETECTED   Rhinovirus / Enterovirus NOT DETECTED NOT DETECTED   Influenza A NOT DETECTED NOT DETECTED   Influenza B NOT DETECTED NOT DETECTED   Parainfluenza Virus 1 NOT DETECTED NOT DETECTED   Parainfluenza Virus 2 NOT DETECTED NOT DETECTED   Parainfluenza Virus 3 NOT DETECTED NOT DETECTED   Parainfluenza Virus 4 NOT DETECTED NOT DETECTED   Respiratory Syncytial Virus NOT DETECTED NOT DETECTED   Bordetella pertussis NOT DETECTED NOT DETECTED   Bordetella Parapertussis NOT DETECTED NOT DETECTED   Chlamydophila pneumoniae NOT DETECTED NOT DETECTED   Mycoplasma pneumoniae NOT DETECTED NOT DETECTED  Brain natriuretic peptide  Result Value Ref Range   B Natriuretic Peptide 388.0 (H) 0.0 - 100.0 pg/mL  CBC with Differential  Result Value Ref Range   WBC 7.5 4.0 - 10.5 K/uL   RBC 4.14 (L) 4.22 - 5.81 MIL/uL   Hemoglobin 12.9 (L) 13.0 - 17.0 g/dL   HCT 69.6 29.5 - 28.4 %   MCV 95.9 80.0 - 100.0 fL   MCH 31.2 26.0 - 34.0 pg   MCHC 32.5 30.0 - 36.0 g/dL   RDW 13.2 44.0 - 10.2 %   Platelets 173 150 - 400 K/uL   nRBC 0.0 0.0 - 0.2 %   Neutrophils Relative % 70 %   Neutro Abs 5.2 1.7 - 7.7 K/uL   Lymphocytes Relative 16 %   Lymphs Abs 1.2 0.7 - 4.0 K/uL   Monocytes Relative 11 %   Monocytes Absolute 0.8 0.1 - 1.0 K/uL   Eosinophils Relative 2 %   Eosinophils Absolute 0.2 0.0 - 0.5 K/uL   Basophils Relative 1 %   Basophils Absolute 0.1 0.0  - 0.1 K/uL   Immature Granulocytes 0 %   Abs Immature Granulocytes 0.03 0.00 - 0.07 K/uL  Blood gas, venous (at [facility] and [facility])  Result Value Ref Range   pH, Ven 7.43 7.25 - 7.43   pCO2, Ven 44 44 - 60 mmHg   pO2, Ven 35 32 - 45 mmHg   Bicarbonate 29.2 (H) 20.0 - 28.0 mmol/L   Acid-Base Excess 4.2 (H) 0.0 - 2.0 mmol/L   O2 Saturation 64.6 %   Patient temperature 37.1    Collection site BLOOD LEFT ARM    Drawn by [id number]   Comprehensive metabolic panel  Result Value Ref Range   Sodium 139 135 - 145 mmol/L   Potassium 4.1 3.5 - 5.1 mmol/L   Chloride 105 98 - 111 mmol/L   CO2 27 22 - 32 mmol/L   Glucose, Bld 103 (H) 70 - 99 mg/dL   BUN 19 8 - 23 mg/dL   Creatinine, Ser 6.44 0.61 - 1.24 mg/dL   Calcium 9.2 8.9 - 03.4 mg/dL   Total Protein 6.8 6.5 - 8.1 g/dL  Albumin 3.7 3.5 - 5.0 g/dL   AST 19 15 - 41 U/L   ALT 23 0 - 44 U/L   Alkaline Phosphatase 75 38 - 126 U/L   Total Bilirubin 1.3 (H) 0.3 - 1.2 mg/dL   GFR, Estimated >91 >47 mL/min   Anion gap 7 5 - 15  Procalcitonin  Result Value Ref Range   Procalcitonin <0.10 ng/mL  D-dimer, quantitative  Result Value Ref Range   D-Dimer, Quant 1.17 (H) 0.00 - 0.50 ug/mL-FEU  Basic metabolic panel  Result Value Ref Range   Sodium 137 135 - 145 mmol/L   Potassium 3.4 (L) 3.5 - 5.1 mmol/L   Chloride 101 98 - 111 mmol/L   CO2 28 22 - 32 mmol/L   Glucose, Bld 88 70 - 99 mg/dL   BUN 19 8 - 23 mg/dL   Creatinine, Ser 8.29 0.61 - 1.24 mg/dL   Calcium 9.2 8.9 - 56.2 mg/dL   GFR, Estimated >13 >08 mL/min   Anion gap 8 5 - 15  Magnesium  Result Value Ref Range   Magnesium 1.7 1.7 - 2.4 mg/dL  CBC  Result Value Ref Range   WBC 7.0 4.0 - 10.5 K/uL   RBC 4.18 (L) 4.22 - 5.81 MIL/uL   Hemoglobin 12.5 (L) 13.0 - 17.0 g/dL   HCT 65.7 84.6 - 96.2 %   MCV 95.7 80.0 - 100.0 fL   MCH 29.9 26.0 - 34.0 pg   MCHC 31.3 30.0 - 36.0 g/dL   RDW 95.2 84.1 - 32.4 %   Platelets 166 150 - 400 K/uL   nRBC 0.0 0.0 - 0.2 %  Basic metabolic panel  Result  Value Ref Range   Sodium 137 135 - 145 mmol/L   Potassium 3.8 3.5 - 5.1 mmol/L   Chloride 101 98 - 111 mmol/L   CO2 28 22 - 32 mmol/L   Glucose, Bld 90 70 - 99 mg/dL   BUN 24 (H) 8 - 23 mg/dL   Creatinine, Ser 4.01 0.61 - 1.24 mg/dL   Calcium 9.0 8.9 - 02.7 mg/dL   GFR, Estimated >25 >36 mL/min   Anion gap 8 5 - 15  Magnesium  Result Value Ref Range   Magnesium 2.2 1.7 - 2.4 mg/dL  Hepatic function panel  Result Value Ref Range   Total Protein 6.8 6.5 - 8.1 g/dL   Albumin 3.4 (L) 3.5 - 5.0 g/dL   AST 18 15 - 41 U/L   ALT 21 0 - 44 U/L   Alkaline Phosphatase 68 38 - 126 U/L   Total Bilirubin 1.0 0.3 - 1.2 mg/dL   Bilirubin, Direct 0.2 0.0 - 0.2 mg/dL   Indirect Bilirubin 0.8 0.3 - 0.9 mg/dL  Basic metabolic panel  Result Value Ref Range   Sodium 138 135 - 145 mmol/L   Potassium 3.5 3.5 - 5.1 mmol/L   Chloride 103 98 - 111 mmol/L   CO2 28 22 - 32 mmol/L   Glucose, Bld 128 (H) 70 - 99 mg/dL   BUN 31 (H) 8 - 23 mg/dL   Creatinine, Ser 6.44 0.61 - 1.24 mg/dL   Calcium 8.8 (L) 8.9 - 10.3 mg/dL   GFR, Estimated >03 >47 mL/min   Anion gap 7 5 - 15  CBC  Result Value Ref Range   WBC 5.0 4.0 - 10.5 K/uL   RBC 4.10 (L) 4.22 - 5.81 MIL/uL   Hemoglobin 12.7 (L) 13.0 - 17.0 g/dL   HCT 42.5 95.6 - 38.7 %  MCV 95.4 80.0 - 100.0 fL   MCH 31.0 26.0 - 34.0 pg   MCHC 32.5 30.0 - 36.0 g/dL   RDW 16.1 09.6 - 04.5 %   Platelets 177 150 - 400 K/uL   nRBC 0.0 0.0 - 0.2 %  Magnesium  Result Value Ref Range   Magnesium 2.1 1.7 - 2.4 mg/dL  ECHOCARDIOGRAM LIMITED  Result Value Ref Range   Weight 3,160.51 oz   Height 72 in   BP 121/78 mmHg   Single Plane A2C EF 34.5 %   Single Plane A4C EF 48.4 %   Calc EF 41.7 %   AV Mean grad 4.0 mmHg   AV Peak grad 8.5 mmHg   Ao pk vel 1.46 m/s   Area-P 1/2 3.21 cm2   S' Lateral 4.60 cm   P 1/2 time 616 msec   Est EF 40 - 45%   Troponin I (High Sensitivity)  Result Value Ref Range   Troponin I (High Sensitivity) 9 <18 ng/L  Troponin I  (High Sensitivity)  Result Value Ref Range   Troponin I (High Sensitivity) 8 <18 ng/L       Pertinent labs & imaging results that were available during my care of the patient were reviewed by me and considered in my medical decision making.  Assessment & Plan:  Joshua Compton was seen today for hospitalization follow-up.  Diagnoses and all orders for this visit:  Acute HFrEF (heart failure with reduced ejection fraction) (HCC) -     Brain natriuretic peptide -     CMP14+EGFR -     Anemia Profile B -     For home use only DME Other see comment  Acute respiratory failure with hypoxia (HCC) -     Brain natriuretic peptide -     CMP14+EGFR -     Anemia Profile B -     For home use only DME Other see comment  PAF (paroxysmal atrial fibrillation) (HCC) -     Brain natriuretic peptide -     CMP14+EGFR -     Lipid panel -     Thyroid Panel With TSH -     Anemia Profile B  High risk medication use -     Brain natriuretic peptide -     CMP14+EGFR -     Lipid panel -     Thyroid Panel With TSH -     VITAMIN D 25 Hydroxy (Vit-D Deficiency, Fractures) -     Anemia Profile B  Community acquired pneumonia of right lower lobe of lung -     Anemia Profile B  Mixed hyperlipidemia -     CMP14+EGFR -     Lipid panel  Vitamin D deficiency -     CMP14+EGFR -     VITAMIN D 25 Hydroxy (Vit-D Deficiency, Fractures)  Supplemental oxygen dependent -     For home use only DME Other see comment  Cerebrovascular accident (CVA) due to embolism of left middle cerebral artery (HCC) -     CMP14+EGFR -     Lipid panel -     Thyroid Panel With TSH -     Anemia Profile B     Assessment and Plan    Recent Stroke 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 Currently receiving home physical and occupational therapy. No changes to medication regimen since hospital discharge. -Continue current therapy regimen. -If speech therapy is not initiated, consider referral.  Heart Failure  and Atrial Fibrillation Recent  hospitalization for pneumonia. Currently on Amiodarone and Eliquis. No reported palpitations or abnormal bleeding. -Continue current medication regimen. -Check thyroid function due to potential effects of Amiodarone. -Advise patient to monitor weight daily and seek medical attention if weight increases by 2 pounds overnight or 5 pounds within a week. -Schedule follow-up with cardiology on November 14th.  Pneumonia Recent hospitalization. Patient currently on home oxygen with oxygen saturation dropping to 83% with exertion. -Advise patient to use oxygen when ambulating if oxygen saturation drops below 90%. -Order incentive spirometer to improve lung capacity. -Consider referral to pulmonology if not already planned. -Plan for repeat chest x-ray in approximately 4 weeks.  General Health Maintenance -Check labs today. -Advise patient to use a consistent scale and weigh at the same time daily to monitor for fluid retention. -Consider lymphedema sleeve for right arm swelling. -Schedule follow-up appointment in 4 weeks.          Continue all other maintenance medications.  Follow up plan: Return in about 4 weeks (around 10/16/2023) for follow up CAP, CXR.   Continue healthy lifestyle choices, including diet (rich in fruits, vegetables, and lean proteins, and low in salt and simple carbohydrates) and exercise (at least 30 minutes of moderate physical activity daily).   The above assessment and management plan was discussed with the patient. The patient verbalized understanding of and has agreed to the management plan. Patient is aware to call the clinic if they develop any new symptoms or if symptoms persist or worsen. Patient is aware when to return to the clinic for a follow-up visit. Patient educated on when it is appropriate to go to the emergency department.   Kari Baars, FNP-C [facility] [phone]

## 2023-09-21 ENCOUNTER — Encounter: Payer: Self-pay | Admitting: Family Medicine

## 2023-09-21 LAB — ANEMIA PROFILE B
Basophils Absolute: 0.1 10*3/uL (ref 0.0–0.2)
Basos: 2 %
EOS (ABSOLUTE): 0.1 10*3/uL (ref 0.0–0.4)
Eos: 2 %
Ferritin: 102 ng/mL (ref 30–400)
Folate: 5.9 ng/mL (ref >3.0)
Hematocrit: 40.8 % (ref 37.5–51.0)
Hemoglobin: 13.3 g/dL (ref 13.0–17.7)
Immature Grans (Abs): 0 10*3/uL (ref 0.0–0.1)
Immature Granulocytes: 0 %
Iron Saturation: 31 % (ref 15–55)
Iron: 90 ug/dL (ref 38–169)
Lymphocytes Absolute: 2.1 10*3/uL (ref 0.7–3.1)
Lymphs: 34 %
MCH: 30.9 pg (ref 26.6–33.0)
MCHC: 32.6 g/dL (ref 31.5–35.7)
MCV: 95 fL (ref 79–97)
Monocytes Absolute: 0.6 10*3/uL (ref 0.1–0.9)
Monocytes: 9 %
Neutrophils Absolute: 3.3 10*3/uL (ref 1.4–7.0)
Neutrophils: 53 %
Platelets: 258 10*3/uL (ref 150–450)
RBC: 4.3 x10E6/uL (ref 4.14–5.80)
RDW: 13 % (ref 11.6–15.4)
Retic Ct Pct: 0.9 % (ref 0.6–2.6)
Total Iron Binding Capacity: 288 ug/dL (ref 250–450)
UIBC: 198 ug/dL (ref 111–343)
Vitamin B-12: 410 pg/mL (ref 232–1245)
WBC: 6.2 10*3/uL (ref 3.4–10.8)

## 2023-09-21 LAB — CMP14+EGFR
ALT: 19 IU/L (ref 0–44)
AST: 20 [IU]/L (ref 0–40)
Albumin: 4.2 g/dL (ref 3.7–4.7)
Alkaline Phosphatase: 96 IU/L (ref 44–121)
BUN/Creatinine Ratio: 14 (ref 10–24)
BUN: 18 mg/dL (ref 8–27)
Bilirubin Total: 0.7 mg/dL (ref 0.0–1.2)
CO2: 29 mmol/L (ref 20–29)
Calcium: 9.7 mg/dL (ref 8.6–10.2)
Chloride: 101 mmol/L (ref 96–106)
Creatinine, Ser: 1.32 mg/dL — ABNORMAL HIGH (ref 0.76–1.27)
Globulin, Total: 2.6 g/dL (ref 1.5–4.5)
Glucose: 84 mg/dL (ref 70–99)
Potassium: 3.4 mmol/L — ABNORMAL LOW (ref 3.5–5.2)
Sodium: 147 mmol/L — ABNORMAL HIGH (ref 134–144)
Total Protein: 6.8 g/dL (ref 6.0–8.5)
eGFR: 54 mL/min/{1.73_m2} — ABNORMAL LOW (ref >59)

## 2023-09-21 LAB — VITAMIN D 25 HYDROXY (VIT D DEFICIENCY, FRACTURES): Vit D, 25-Hydroxy: 61.4 ng/mL (ref 30.0–100.0)

## 2023-09-21 LAB — LIPID PANEL
Chol/HDL Ratio: 2.3 ratio (ref 0.0–5.0)
Cholesterol, Total: 141 mg/dL (ref 100–199)
HDL: 62 mg/dL (ref >39)
LDL Chol Calc (NIH): 66 mg/dL (ref 0–99)
Triglycerides: 60 mg/dL (ref 0–149)
VLDL Cholesterol Cal: 13 mg/dL (ref 5–40)

## 2023-09-21 LAB — THYROID PANEL WITH TSH
Free Thyroxine Index: 2.9 (ref 1.2–4.9)
T3 Uptake Ratio: 30 % (ref 24–39)
T4, Total: 9.8 ug/dL (ref 4.5–12.0)
TSH: 1.47 u[IU]/mL (ref 0.450–4.500)

## 2023-09-21 LAB — BRAIN NATRIURETIC PEPTIDE: BNP: 196.6 pg/mL — ABNORMAL HIGH (ref 0.0–100.0)

## 2023-09-22 ENCOUNTER — Ambulatory Visit: Payer: Medicare HMO

## 2023-09-22 ENCOUNTER — Encounter: Payer: Self-pay | Admitting: Vascular Surgery

## 2023-09-22 ENCOUNTER — Ambulatory Visit (INDEPENDENT_AMBULATORY_CARE_PROVIDER_SITE_OTHER): Payer: Medicare HMO | Admitting: Vascular Surgery

## 2023-09-22 VITALS — BP 141/64 | HR 48 | Temp 98.1°F | Ht 73.0 in | Wt 198.2 lb

## 2023-09-22 DIAGNOSIS — R202 Paresthesia of skin: Secondary | ICD-10-CM

## 2023-09-22 DIAGNOSIS — R252 Cramp and spasm: Secondary | ICD-10-CM | POA: Diagnosis not present

## 2023-09-22 NOTE — Progress Notes (Signed)
Patient name: Joshua Compton. MRN: [id number] DOB: [birth year] Sex: male  REASON FOR CONSULT: Lower extremity claudication  HPI: Kyre Jeffries. is a 82 y.o. male, with history of hypertension hyperlipidemia as well as recent stroke that presents for evaluation of suspected lower extremity claudication.  Patient states he does not have any pain in his legs when he walks.  His main complaint is that his feet feel cold all the time.  No open ulcers.  No previous revascularization.  Seen in 2021 by Dr. Arbie Cookey and had palpable pulses with no signs of significant arterial insufficiency.  Past Medical History:  Diagnosis Date   Arthritis    Cancer (HCC)    prostate ca   seed implants   GERD (gastroesophageal reflux disease)    Glaucoma    Hyperlipidemia    Hypertension    dr [place] Charon     [place]   fm   Stroke [facility]) 07/2023    Past Surgical History:  Procedure Laterality Date   BACK SURGERY     x2   COLONOSCOPY     HERNIA REPAIR  11-06-2009   left inguinal repair    NECK SURGERY     ROTATOR CUFF REPAIR Left jan 2016   TOTAL HIP ARTHROPLASTY Right 04/20/2020   Procedure: RIGHT TOTAL HIP ARTHROPLASTY ANTERIOR APPROACH;  Surgeon: Tarry Kos, MD;  Location: [facility] OR;  Service: Orthopedics;  Laterality: Right;   TOTAL HIP ARTHROPLASTY Left 01/07/2021   Procedure: LEFT TOTAL HIP ARTHROPLASTY ANTERIOR APPROACH;  Surgeon: Tarry Kos, MD;  Location: [facility] OR;  Service: Orthopedics;  Laterality: Left;   TRANSURETHRAL RESECTION OF PROSTATE      Family History  Problem Relation Age of Onset   Cancer Mother        breast   Heart disease Father    Cancer Brother    Cancer Brother     SOCIAL HISTORY: Social History   Socioeconomic History   Marital status: Married    Spouse name: Not on file   Number of children: 5   Years of education: 11   Highest education level: 11th grade  Occupational History   Occupation: Nutritional therapist    Comment: retired  Tobacco Use    Smoking status: Former    Current packs/day: 0.00    Average packs/day: 0.5 packs/day for 30.0 years (15.0 ttl pk-yrs)    Types: Cigarettes    Start date: 03/29/1975    Quit date: 03/28/2005    Years since quitting: 18.4   Smokeless tobacco: Never  Vaping Use   Vaping status: Never Used  Substance and Sexual Activity   Alcohol use: No    Alcohol/week: 0.0 standard drinks of alcohol   Drug use: No   Sexual activity: Yes  Other Topics Concern   Not on file  Social History Narrative   Grandson lives with them - no stairs   Social Determinants of Health   Financial Resource Strain: Low Risk  (09/16/2023)   Received from [facility]   Overall Financial Resource Strain (CARDIA)    Difficulty of Paying Living Expenses: Not hard at all  Food Insecurity: No Food Insecurity (09/16/2023)   Received from [facility]   Hunger Vital Sign    Worried About Running Out of Food in the Last Year: Never true    Ran Out of Food in the Last Year: Never true  Transportation Needs: No Transportation Needs (09/16/2023)   Received from [facility] -  Administrator, Civil Service (Medical): No    Lack of Transportation (Non-Medical): No  Physical Activity: Sufficiently Active (01/14/2023)   Exercise Vital Sign    Days of Exercise per Week: 5 days    Minutes of Exercise per Session: 30 min  Stress: Patient Unable To Answer (08/07/2023)   Received from [facility] - Occupational Stress Questionnaire    Feeling of Stress : Patient unable to answer  Social Connections: Unknown (08/06/2023)   Received from [facility]   Social Network    Social Network: Not on file  Intimate Partner Violence: Not At Risk (09/05/2023)   Humiliation, Afraid, Rape, and Kick questionnaire    Fear of Current or Ex-Partner: No    Emotionally Abused: No    Physically Abused: No    Sexually Abused: No    Allergies  Allergen Reactions   Iodinated  Contrast Media Anaphylaxis    Tongue swelled up when he had MRI in [place]  Intubated for airway protection after oral and throat swelling   Multihance [Gadobenate] Swelling    Tongue swelled up when he had MRI in [place]   Pravastatin Other (See Comments)    Muscle aches   Pravastatin Sodium     Muscle aches    Current Outpatient Medications  Medication Sig Dispense Refill   albuterol (VENTOLIN HFA) 108 (90 Base) MCG/ACT inhaler Inhale 1-2 puffs into the lungs every 6 (six) hours as needed for wheezing or shortness of breath. 18 g 0   amiodarone (PACERONE) 200 MG tablet Take 1 tablet (200 mg total) by mouth 2 (two) times daily. X 20 days, then once daily starting 09/28/23 60 tablet 1   apixaban (ELIQUIS) 5 MG TABS tablet Take 1 tablet (5 mg total) by mouth 2 (two) times daily. 60 tablet 1   atorvastatin (LIPITOR) 40 MG tablet Take 40 mg by mouth at bedtime.     cholecalciferol (VITAMIN D3) 25 MCG (1000 UNIT) tablet Take 1,000 Units by mouth daily.     dapagliflozin propanediol (FARXIGA) 10 MG TABS tablet Take 1 tablet (10 mg total) by mouth daily. 30 tablet 1   dorzolamide-timolol (COSOPT) 22.3-6.8 MG/ML ophthalmic solution Place 1 drop into both eyes daily.      fluticasone (FLONASE) 50 MCG/ACT nasal spray Place 2 sprays into both nostrils daily. 16 g 6   furosemide (LASIX) 40 MG tablet Take 1 tablet (40 mg total) by mouth daily. 30 tablet 1   metoprolol succinate (TOPROL-XL) 50 MG 24 hr tablet TAKE 1 TABLET (50 MG TOTAL) BY MOUTH DAILY. FOR BLOOD PRESSURE CONTROL 90 tablet 0   pantoprazole (PROTONIX) 40 MG tablet Take 1 tablet (40 mg total) by mouth daily. For heartburn 90 tablet 3   rOPINIRole (REQUIP) 1 MG tablet Take 1 tablet (1 mg total) by mouth 3 (three) times daily. Plus an extra tablet at bedtime to total four a day 360 tablet 3   sacubitril-valsartan (ENTRESTO) 24-26 MG Take 1 tablet by mouth 2 (two) times daily. 60 tablet 1   No current facility-administered medications  for this visit.    REVIEW OF SYSTEMS:  [X]  denotes positive finding, [ ]  denotes negative finding Cardiac  Comments:  Chest pain or chest pressure:    Shortness of breath upon exertion:    Short of breath when lying flat:    Irregular heart rhythm:        Vascular    Pain in calf, thigh, or  hip brought on by ambulation:    Pain in feet at night that wakes you up from your sleep:     Blood clot in your veins:    Leg swelling:         Pulmonary    Oxygen at home:    Productive cough:     Wheezing:         Neurologic    Sudden weakness in arms or legs:     Sudden numbness in arms or legs:     Sudden onset of difficulty speaking or slurred speech:    Temporary loss of vision in one eye:     Problems with dizziness:         Gastrointestinal    Blood in stool:     Vomited blood:         Genitourinary    Burning when urinating:     Blood in urine:        Psychiatric    Major depression:         Hematologic    Bleeding problems:    Problems with blood clotting too easily:        Skin    Rashes or ulcers:        Constitutional    Fever or chills:      PHYSICAL EXAM: Vitals:   09/22/23 1538  BP: (!) 141/64  Pulse: (!) 48  Temp: 98.1 F (36.7 C)  Weight: 198 lb 3.2 oz (89.9 kg)  Height: 6\' 1"  (1.854 m)    GENERAL: The patient is a well-nourished male, in no acute distress. The vital signs are documented above. CARDIAC: There is a regular rate and rhythm.  VASCULAR:  Bilateral femoral pulses palpable Bilateral PT pulses palpable No lower extremity tissue loss PULMONARY: No respiratory distress. ABDOMEN: Soft and non-tender. MUSCULOSKELETAL: There are no major deformities or cyanosis. NEUROLOGIC: No focal weakness or paresthesias are detected. SKIN: There are no ulcers or rashes noted. PSYCHIATRIC: The patient has a normal affect.  DATA:   ABI's today are 1.14 on the right triphasic and 1.14 on the left triphasic  Assessment/Plan:  82 y.o. male,  with history of hypertension hyperlipidemia as well as recent stroke that presents for evaluation of suspected lower extremity claudication.  In further discussing with the patient he does not have any classic signs or symptoms of lower extremity claudication.  His main complaint is that his feet are cold all the time.  On exam he has palpable PT pulses.  His ABIs are normal with no signs of significant arterial insufficiency.  I discussed with him that he has a normal exam and normal noninvasive imaging and there is no indication for further intervention.  He can follow-up with [place] as needed. No signs of significant arterial insufficiency.     Cephus Shelling, MD [facility] Office: [phone]

## 2023-09-22 NOTE — Addendum Note (Signed)
Addended by: Lorelee Cover C on: 09/22/2023 10:14 AM   Modules accepted: Orders

## 2023-09-23 DIAGNOSIS — I69351 Hemiplegia and hemiparesis following cerebral infarction affecting right dominant side: Secondary | ICD-10-CM | POA: Diagnosis not present

## 2023-09-23 DIAGNOSIS — E785 Hyperlipidemia, unspecified: Secondary | ICD-10-CM | POA: Diagnosis not present

## 2023-09-23 DIAGNOSIS — I480 Paroxysmal atrial fibrillation: Secondary | ICD-10-CM | POA: Diagnosis not present

## 2023-09-23 DIAGNOSIS — I69322 Dysarthria following cerebral infarction: Secondary | ICD-10-CM | POA: Diagnosis not present

## 2023-09-23 DIAGNOSIS — I10 Essential (primary) hypertension: Secondary | ICD-10-CM | POA: Diagnosis not present

## 2023-09-23 DIAGNOSIS — Z8673 Personal history of transient ischemic attack (TIA), and cerebral infarction without residual deficits: Secondary | ICD-10-CM | POA: Diagnosis not present

## 2023-09-23 DIAGNOSIS — Z79899 Other long term (current) drug therapy: Secondary | ICD-10-CM | POA: Diagnosis not present

## 2023-09-23 DIAGNOSIS — N179 Acute kidney failure, unspecified: Secondary | ICD-10-CM | POA: Diagnosis not present

## 2023-09-23 DIAGNOSIS — I69328 Other speech and language deficits following cerebral infarction: Secondary | ICD-10-CM | POA: Diagnosis not present

## 2023-09-23 DIAGNOSIS — Z7982 Long term (current) use of aspirin: Secondary | ICD-10-CM | POA: Diagnosis not present

## 2023-09-23 DIAGNOSIS — I69320 Aphasia following cerebral infarction: Secondary | ICD-10-CM | POA: Diagnosis not present

## 2023-09-23 DIAGNOSIS — I69391 Dysphagia following cerebral infarction: Secondary | ICD-10-CM | POA: Diagnosis not present

## 2023-09-23 DIAGNOSIS — R002 Palpitations: Secondary | ICD-10-CM | POA: Diagnosis not present

## 2023-09-28 ENCOUNTER — Other Ambulatory Visit: Payer: Self-pay | Admitting: Nurse Practitioner

## 2023-09-28 ENCOUNTER — Other Ambulatory Visit: Payer: Self-pay | Admitting: Family Medicine

## 2023-09-28 DIAGNOSIS — J40 Bronchitis, not specified as acute or chronic: Secondary | ICD-10-CM

## 2023-09-29 LAB — VAS US ABI WITH/WO TBI
Left ABI: 1.14
Right ABI: 1.14

## 2023-09-30 ENCOUNTER — Telehealth: Payer: Self-pay | Admitting: Family Medicine

## 2023-09-30 DIAGNOSIS — N1831 Chronic kidney disease, stage 3a: Secondary | ICD-10-CM | POA: Diagnosis not present

## 2023-09-30 DIAGNOSIS — I69322 Dysarthria following cerebral infarction: Secondary | ICD-10-CM | POA: Diagnosis not present

## 2023-09-30 DIAGNOSIS — Z7901 Long term (current) use of anticoagulants: Secondary | ICD-10-CM | POA: Diagnosis not present

## 2023-09-30 DIAGNOSIS — J440 Chronic obstructive pulmonary disease with acute lower respiratory infection: Secondary | ICD-10-CM | POA: Diagnosis not present

## 2023-09-30 DIAGNOSIS — K219 Gastro-esophageal reflux disease without esophagitis: Secondary | ICD-10-CM | POA: Diagnosis not present

## 2023-09-30 DIAGNOSIS — I130 Hypertensive heart and chronic kidney disease with heart failure and stage 1 through stage 4 chronic kidney disease, or unspecified chronic kidney disease: Secondary | ICD-10-CM | POA: Diagnosis not present

## 2023-09-30 DIAGNOSIS — E876 Hypokalemia: Secondary | ICD-10-CM | POA: Diagnosis not present

## 2023-09-30 DIAGNOSIS — J9601 Acute respiratory failure with hypoxia: Secondary | ICD-10-CM | POA: Diagnosis not present

## 2023-09-30 DIAGNOSIS — I5041 Acute combined systolic (congestive) and diastolic (congestive) heart failure: Secondary | ICD-10-CM | POA: Diagnosis not present

## 2023-09-30 DIAGNOSIS — N179 Acute kidney failure, unspecified: Secondary | ICD-10-CM | POA: Diagnosis not present

## 2023-09-30 DIAGNOSIS — I4891 Unspecified atrial fibrillation: Secondary | ICD-10-CM | POA: Diagnosis not present

## 2023-09-30 DIAGNOSIS — E782 Mixed hyperlipidemia: Secondary | ICD-10-CM | POA: Diagnosis not present

## 2023-09-30 DIAGNOSIS — I69391 Dysphagia following cerebral infarction: Secondary | ICD-10-CM | POA: Diagnosis not present

## 2023-09-30 DIAGNOSIS — I69328 Other speech and language deficits following cerebral infarction: Secondary | ICD-10-CM | POA: Diagnosis not present

## 2023-09-30 DIAGNOSIS — Z8546 Personal history of malignant neoplasm of prostate: Secondary | ICD-10-CM | POA: Diagnosis not present

## 2023-09-30 DIAGNOSIS — J181 Lobar pneumonia, unspecified organism: Secondary | ICD-10-CM | POA: Diagnosis not present

## 2023-09-30 DIAGNOSIS — I69351 Hemiplegia and hemiparesis following cerebral infarction affecting right dominant side: Secondary | ICD-10-CM | POA: Diagnosis not present

## 2023-09-30 DIAGNOSIS — M1990 Unspecified osteoarthritis, unspecified site: Secondary | ICD-10-CM | POA: Diagnosis not present

## 2023-09-30 DIAGNOSIS — R7303 Prediabetes: Secondary | ICD-10-CM | POA: Diagnosis not present

## 2023-09-30 DIAGNOSIS — Z9981 Dependence on supplemental oxygen: Secondary | ICD-10-CM | POA: Diagnosis not present

## 2023-09-30 DIAGNOSIS — Z7982 Long term (current) use of aspirin: Secondary | ICD-10-CM | POA: Diagnosis not present

## 2023-09-30 DIAGNOSIS — I69320 Aphasia following cerebral infarction: Secondary | ICD-10-CM | POA: Diagnosis not present

## 2023-09-30 DIAGNOSIS — Z96641 Presence of right artificial hip joint: Secondary | ICD-10-CM | POA: Diagnosis not present

## 2023-09-30 DIAGNOSIS — H409 Unspecified glaucoma: Secondary | ICD-10-CM | POA: Diagnosis not present

## 2023-09-30 NOTE — Telephone Encounter (Signed)
TC to [facility] gave VO for ST - LMOVM to [place]

## 2023-09-30 NOTE — Telephone Encounter (Signed)
Copied from CRM [id number]. Topic: Clinical - Home Health Verbal Orders >> Sep 28, 2023  9:46 AM [name] wrote: Please notify home health of verbal order for speech therapy. I agree, please notify  [name]

## 2023-10-01 ENCOUNTER — Encounter: Payer: Self-pay | Admitting: Nurse Practitioner

## 2023-10-01 ENCOUNTER — Ambulatory Visit: Payer: Medicare HMO | Attending: Nurse Practitioner | Admitting: Nurse Practitioner

## 2023-10-01 VITALS — BP 128/68 | HR 49 | Ht 72.0 in | Wt 202.0 lb

## 2023-10-01 DIAGNOSIS — I639 Cerebral infarction, unspecified: Secondary | ICD-10-CM

## 2023-10-01 DIAGNOSIS — I4891 Unspecified atrial fibrillation: Secondary | ICD-10-CM

## 2023-10-01 DIAGNOSIS — R001 Bradycardia, unspecified: Secondary | ICD-10-CM

## 2023-10-01 DIAGNOSIS — I10 Essential (primary) hypertension: Secondary | ICD-10-CM | POA: Diagnosis not present

## 2023-10-01 DIAGNOSIS — J449 Chronic obstructive pulmonary disease, unspecified: Secondary | ICD-10-CM

## 2023-10-01 DIAGNOSIS — I5022 Chronic systolic (congestive) heart failure: Secondary | ICD-10-CM

## 2023-10-01 MED ORDER — METOPROLOL SUCCINATE ER 25 MG PO TB24: 12.5000 mg | ORAL_TABLET | Freq: Every day | ORAL | 1 refills | Status: DC

## 2023-10-01 NOTE — Progress Notes (Signed)
Cardiology Office Note:  .   Date:  10/01/2023 ID:  Joshua Compton., DOB [birth year], MRN [id number] PCP: Joshua Claude, MD  [facility] Providers Cardiologist:  Joshua Haws, MD    History of Present Illness: .   Joshua Compton. is a 82 y.o. male with a PMH of HFmrEF, new onset A-fib, hypertension, hyperlipidemia, prediabetes, history of CVA with right hemiparesis, and history of prostate cancer, GERD, COPD, CKD stage IIIa, who presents today for 3 week follow-up.   He had previously been admitted in September 2024 and was transferred to [facility] for right sided weakness and aphasia, was found to have acute left frontal lobe infarct and felt to be cryptogenic.  CTA of head and neck was negative for LVO.  Echocardiogram revealed EF 50 to 55%, mild AI, no WMA, trace MR/TR, no AAS.  Was discharged to inpatient rehab.  Hospitalized October 2024 for shortness of breath.  Patient noted increasing lower extremity edema and orthopnea, reported sleeping an incline or for the past 2 weeks prior to presenting to hospital.  CXR revealed increased interstitial markings, right lower lobe infiltrate.  Was treated with IV Lasix and antibiotics.  Treated for pneumonia and pulmonary edema.  Limited echo during hospitalization revealed EF 40 to 45%, mild to moderate AI, global hypokinesis.  Started Sherryll Burger, Marcelline Deist.  Hospital course also noted by new onset A-fib.  Cardiology was consulted and was started on amiodarone 200 mg twice daily x 3 weeks, then once daily, started on Eliquis.  Seen by [facility] on 09/23/2023. HR 45 bpm on exam, Metoprolol dosage was reduced to 25 mg daily.   Today he presents for 3-week follow-up with his daughter.  He states ever since leaving the hospital, he is wearing 2 liters of continuous oxygen. Doing well. Denies any chest pain, shortness of breath, palpitations, syncope, presyncope, dizziness, orthopnea, PND, swelling or significant weight  changes, acute bleeding, or claudication. Does have some slurred speech from stroke and right sided weakness, wearing compression device over right hand to help improve swelling.   ROS: Negative. See HPI.   Studies Reviewed: Marland Kitchen    EKG: EKG Interpretation Date/Time:  Thursday October 01 2023 16:02:05 EST Ventricular Rate:  51 PR Interval:    QRS Duration:  100 QT Interval:  486 QTC Calculation: 447 R Axis:   36  Text Interpretation: Sinus bradycardia When compared with ECG of 07-Sep-2023 09:38, Vent. rate has decreased BY  44 BPM Confirmed by Joshua Compton [phone]) on 10/01/2023 4:13:29 PM   ABI's 09/2023: Summary:  Right: Resting right ankle-brachial index is within normal range. The  right toe-brachial index is normal.   Left: Resting left ankle-brachial index is within normal range. The left  toe-brachial index is normal.  Limited 08/2023:   1. Left ventricular ejection fraction, by estimation, is 40 to 45%. The  left ventricle has mildly decreased function. The left ventricle  demonstrates global hypokinesis. The left ventricular internal cavity size  was moderately dilated.   2. The mitral valve is grossly normal. No evidence of mitral valve  regurgitation.   3. The aortic valve is calcified. Aortic valve regurgitation is mild to  moderate. Aortic valve sclerosis is present, with no evidence of aortic  valve stenosis.  Venous US of right upper extremity 08/2023: No evidence of DVT. Risk Assessment/Calculations:    CHA2DS2-VASc Score = 7   This indicates a 11.2% annual risk of stroke. The patient's score is based upon: CHF History: 1  HTN History: 1 Diabetes History: 0 Stroke History: 2 Vascular Disease History: 1 Age Score: 2 Gender Score: 0  Physical Exam:   VS:  BP 128/68   Pulse (!) 49   Ht 6' (1.829 m)   Wt 202 lb (91.6 kg)   SpO2 96% Comment: 2 litters  BMI 27.40 kg/m    Wt Readings from Last 3 Encounters:  10/01/23 202 lb (91.6 kg)  09/22/23 198  lb 3.2 oz (89.9 kg)  09/18/23 199 lb (90.3 kg)    GEN: Well nourished, well developed in no acute distress, wearing chronic O2 NECK: No JVD; No carotid bruits CARDIAC: S1/S2, slow rate and regular rhythm, no murmurs, rubs, gallops RESPIRATORY:  Clear to auscultation without rales, wheezing or rhonchi  ABDOMEN: Soft, non-tender, non-distended EXTREMITIES:  No edema; No deformity, right sided weakness with compression device on right hand/wrist.    ASSESSMENT AND PLAN: .    HFmrEF Stage C, NYHA class II-III symptoms. EF 08/2023 40-45%. Felt to be tachycardia mediated. Euvolemic and well compensated on exam. GDMT limited d/t patient's blood pressure/HR. Reducing Toprol XL to 12.5 mg daily - see below. Continue Farxiga, Lasix, and Entresto. Low sodium diet, fluid restriction <2L, and daily weights encouraged. Educated to contact our office for weight gain of 2 lbs overnight or 5 lbs in one week.  A-fib, bradycardia Denies any tachycardia or palpitations. EKG shows SB, 51 bpm. Asymptomatic with bradycardia. ? A-fib possibly triggered by PNA from previous hospitalization. Will reduce Toprol XL to 12.5 mg daily. Continue Amiodarone. Continue Eliquis for stroke prevention, on appropriate dosage and denies any bleeding issues. Care and ED precautions discussed.   HTN BP stable. Discussed to monitor BP at home at least 2 hours after medications and sitting for 5-10 minutes. Reducing Toprol XL as mentioned above. Heart healthy diet encouraged.   CVA Has completed inpatient rehab. Has made significant progress from his stroke, still has right side weakness and jumbled/slurred speech at times. Recommended to continue to follow with PCP, stated may benefit from some continued therapy. Continue to follow with PCP and Neurology.  COPD Now on 2 liters of oxygen. Breathing is stable. Will place referral to Pulmonology as he had previous lung/breathing tests ordered by [facility], pt requesting care closer to  home. Care and ED precautions discussed. Continue to follow with PCP.   Dispo: Follow-up with me/APP in 6-8 weeks or sooner if anything changes.   Signed, Joshua Dory, NP

## 2023-10-01 NOTE — Patient Instructions (Addendum)
Medication Instructions:  Your physician has recommended you make the following change in your medication:  Please reduce Metoprolol to 12.5 Mg daily   Labwork: None   Testing/Procedures: None   Follow-Up: Your physician recommends that you schedule a follow-up appointment in: 6-8 weeks   Any Other Special Instructions Will Be Listed Below (If Applicable).  If you need a refill on your cardiac medications before your next appointment, please call your pharmacy.

## 2023-10-02 ENCOUNTER — Other Ambulatory Visit: Payer: Medicare HMO

## 2023-10-02 DIAGNOSIS — J9601 Acute respiratory failure with hypoxia: Secondary | ICD-10-CM | POA: Diagnosis not present

## 2023-10-02 DIAGNOSIS — I69322 Dysarthria following cerebral infarction: Secondary | ICD-10-CM | POA: Diagnosis not present

## 2023-10-02 DIAGNOSIS — N179 Acute kidney failure, unspecified: Secondary | ICD-10-CM | POA: Diagnosis not present

## 2023-10-02 DIAGNOSIS — J440 Chronic obstructive pulmonary disease with acute lower respiratory infection: Secondary | ICD-10-CM | POA: Diagnosis not present

## 2023-10-02 DIAGNOSIS — Z8546 Personal history of malignant neoplasm of prostate: Secondary | ICD-10-CM | POA: Diagnosis not present

## 2023-10-02 DIAGNOSIS — J181 Lobar pneumonia, unspecified organism: Secondary | ICD-10-CM | POA: Diagnosis not present

## 2023-10-02 DIAGNOSIS — Z7982 Long term (current) use of aspirin: Secondary | ICD-10-CM | POA: Diagnosis not present

## 2023-10-02 DIAGNOSIS — Z7901 Long term (current) use of anticoagulants: Secondary | ICD-10-CM | POA: Diagnosis not present

## 2023-10-02 DIAGNOSIS — I5041 Acute combined systolic (congestive) and diastolic (congestive) heart failure: Secondary | ICD-10-CM | POA: Diagnosis not present

## 2023-10-02 DIAGNOSIS — I69351 Hemiplegia and hemiparesis following cerebral infarction affecting right dominant side: Secondary | ICD-10-CM | POA: Diagnosis not present

## 2023-10-02 DIAGNOSIS — H409 Unspecified glaucoma: Secondary | ICD-10-CM | POA: Diagnosis not present

## 2023-10-02 DIAGNOSIS — Z9981 Dependence on supplemental oxygen: Secondary | ICD-10-CM | POA: Diagnosis not present

## 2023-10-02 DIAGNOSIS — K219 Gastro-esophageal reflux disease without esophagitis: Secondary | ICD-10-CM | POA: Diagnosis not present

## 2023-10-02 DIAGNOSIS — E782 Mixed hyperlipidemia: Secondary | ICD-10-CM | POA: Diagnosis not present

## 2023-10-02 DIAGNOSIS — M1990 Unspecified osteoarthritis, unspecified site: Secondary | ICD-10-CM | POA: Diagnosis not present

## 2023-10-02 DIAGNOSIS — I69328 Other speech and language deficits following cerebral infarction: Secondary | ICD-10-CM | POA: Diagnosis not present

## 2023-10-02 DIAGNOSIS — I69320 Aphasia following cerebral infarction: Secondary | ICD-10-CM | POA: Diagnosis not present

## 2023-10-02 DIAGNOSIS — E876 Hypokalemia: Secondary | ICD-10-CM | POA: Diagnosis not present

## 2023-10-02 DIAGNOSIS — I130 Hypertensive heart and chronic kidney disease with heart failure and stage 1 through stage 4 chronic kidney disease, or unspecified chronic kidney disease: Secondary | ICD-10-CM | POA: Diagnosis not present

## 2023-10-02 DIAGNOSIS — R7303 Prediabetes: Secondary | ICD-10-CM | POA: Diagnosis not present

## 2023-10-02 DIAGNOSIS — N1831 Chronic kidney disease, stage 3a: Secondary | ICD-10-CM | POA: Diagnosis not present

## 2023-10-02 DIAGNOSIS — I69391 Dysphagia following cerebral infarction: Secondary | ICD-10-CM | POA: Diagnosis not present

## 2023-10-02 DIAGNOSIS — I4891 Unspecified atrial fibrillation: Secondary | ICD-10-CM | POA: Diagnosis not present

## 2023-10-02 DIAGNOSIS — Z96641 Presence of right artificial hip joint: Secondary | ICD-10-CM | POA: Diagnosis not present

## 2023-10-04 ENCOUNTER — Other Ambulatory Visit: Payer: Self-pay | Admitting: Family Medicine

## 2023-10-06 ENCOUNTER — Other Ambulatory Visit: Payer: Medicare HMO

## 2023-10-06 DIAGNOSIS — N289 Disorder of kidney and ureter, unspecified: Secondary | ICD-10-CM

## 2023-10-07 ENCOUNTER — Telehealth: Payer: Self-pay | Admitting: Family Medicine

## 2023-10-07 ENCOUNTER — Telehealth: Payer: Self-pay | Admitting: Cardiovascular Disease

## 2023-10-07 DIAGNOSIS — Z96641 Presence of right artificial hip joint: Secondary | ICD-10-CM | POA: Diagnosis not present

## 2023-10-07 DIAGNOSIS — H409 Unspecified glaucoma: Secondary | ICD-10-CM | POA: Diagnosis not present

## 2023-10-07 DIAGNOSIS — J181 Lobar pneumonia, unspecified organism: Secondary | ICD-10-CM | POA: Diagnosis not present

## 2023-10-07 DIAGNOSIS — Z9981 Dependence on supplemental oxygen: Secondary | ICD-10-CM | POA: Diagnosis not present

## 2023-10-07 DIAGNOSIS — K219 Gastro-esophageal reflux disease without esophagitis: Secondary | ICD-10-CM | POA: Diagnosis not present

## 2023-10-07 DIAGNOSIS — Z8546 Personal history of malignant neoplasm of prostate: Secondary | ICD-10-CM | POA: Diagnosis not present

## 2023-10-07 DIAGNOSIS — J9601 Acute respiratory failure with hypoxia: Secondary | ICD-10-CM | POA: Diagnosis not present

## 2023-10-07 DIAGNOSIS — I69351 Hemiplegia and hemiparesis following cerebral infarction affecting right dominant side: Secondary | ICD-10-CM | POA: Diagnosis not present

## 2023-10-07 DIAGNOSIS — I5041 Acute combined systolic (congestive) and diastolic (congestive) heart failure: Secondary | ICD-10-CM | POA: Diagnosis not present

## 2023-10-07 DIAGNOSIS — Z7982 Long term (current) use of aspirin: Secondary | ICD-10-CM | POA: Diagnosis not present

## 2023-10-07 DIAGNOSIS — M1990 Unspecified osteoarthritis, unspecified site: Secondary | ICD-10-CM | POA: Diagnosis not present

## 2023-10-07 DIAGNOSIS — I69328 Other speech and language deficits following cerebral infarction: Secondary | ICD-10-CM | POA: Diagnosis not present

## 2023-10-07 DIAGNOSIS — J440 Chronic obstructive pulmonary disease with acute lower respiratory infection: Secondary | ICD-10-CM | POA: Diagnosis not present

## 2023-10-07 DIAGNOSIS — I5022 Chronic systolic (congestive) heart failure: Secondary | ICD-10-CM

## 2023-10-07 DIAGNOSIS — Z79899 Other long term (current) drug therapy: Secondary | ICD-10-CM

## 2023-10-07 DIAGNOSIS — N1831 Chronic kidney disease, stage 3a: Secondary | ICD-10-CM | POA: Diagnosis not present

## 2023-10-07 DIAGNOSIS — Z7901 Long term (current) use of anticoagulants: Secondary | ICD-10-CM | POA: Diagnosis not present

## 2023-10-07 DIAGNOSIS — N179 Acute kidney failure, unspecified: Secondary | ICD-10-CM | POA: Diagnosis not present

## 2023-10-07 DIAGNOSIS — E876 Hypokalemia: Secondary | ICD-10-CM | POA: Diagnosis not present

## 2023-10-07 DIAGNOSIS — I69322 Dysarthria following cerebral infarction: Secondary | ICD-10-CM | POA: Diagnosis not present

## 2023-10-07 DIAGNOSIS — R7303 Prediabetes: Secondary | ICD-10-CM | POA: Diagnosis not present

## 2023-10-07 DIAGNOSIS — I4891 Unspecified atrial fibrillation: Secondary | ICD-10-CM | POA: Diagnosis not present

## 2023-10-07 DIAGNOSIS — I69320 Aphasia following cerebral infarction: Secondary | ICD-10-CM | POA: Diagnosis not present

## 2023-10-07 DIAGNOSIS — I130 Hypertensive heart and chronic kidney disease with heart failure and stage 1 through stage 4 chronic kidney disease, or unspecified chronic kidney disease: Secondary | ICD-10-CM | POA: Diagnosis not present

## 2023-10-07 DIAGNOSIS — E782 Mixed hyperlipidemia: Secondary | ICD-10-CM | POA: Diagnosis not present

## 2023-10-07 DIAGNOSIS — I69391 Dysphagia following cerebral infarction: Secondary | ICD-10-CM | POA: Diagnosis not present

## 2023-10-07 LAB — CMP14+EGFR
ALT: 19 [IU]/L (ref 0–44)
AST: 22 [IU]/L (ref 0–40)
Albumin: 4.2 g/dL (ref 3.7–4.7)
Alkaline Phosphatase: 102 [IU]/L (ref 44–121)
BUN/Creatinine Ratio: 13 (ref 10–24)
BUN: 21 mg/dL (ref 8–27)
Bilirubin Total: 0.8 mg/dL (ref 0.0–1.2)
CO2: 27 mmol/L (ref 20–29)
Calcium: 9.8 mg/dL (ref 8.6–10.2)
Chloride: 103 mmol/L (ref 96–106)
Creatinine, Ser: 1.68 mg/dL — ABNORMAL HIGH (ref 0.76–1.27)
Globulin, Total: 2.6 g/dL (ref 1.5–4.5)
Glucose: 82 mg/dL (ref 70–99)
Potassium: 3.8 mmol/L (ref 3.5–5.2)
Sodium: 145 mmol/L — ABNORMAL HIGH (ref 134–144)
Total Protein: 6.8 g/dL (ref 6.0–8.5)
eGFR: 40 mL/min/{1.73_m2} — ABNORMAL LOW (ref >59)

## 2023-10-07 NOTE — Telephone Encounter (Signed)
Copied from CRM [id number]. Topic: Clinical - Home Health Verbal Orders >> Sep 23, 2023 12:42 PM Adelina Mings wrote: Reason for CRM: verbal orders for speech therapy >> Sep 28, 2023  9:46 AM [name] wrote: Please notify home health of verbal order for speech therapy.

## 2023-10-07 NOTE — Telephone Encounter (Signed)
Please have him hold his Lasix, and I do not want him to take this for now. Please repeat a BMET in 7 days. He needs to stay well hydrated and adhere to fluid intake of < 2 liters per day. Thanks!   Best,  Sharlene Dory, NP   Spoke with Daughter advised her of changes lab order placed and will have repeated when she takes him for an appt on 12/02. Verbalized understanding.

## 2023-10-07 NOTE — Telephone Encounter (Signed)
Pt c/o medication issue:  1. Name of Medication:   sacubitril-valsartan (ENTRESTO) 24-26 MG  apixaban (ELIQUIS) 5 MG TABS tablet   2. How are you currently taking this medication (dosage and times per day)?   As prescribed  3. Are you having a reaction (difficulty breathing--STAT)?   4. What is your medication issue?   Daughter Dorene Grebe) stated patient's PCP had blood work done and notes patient's kidney function results were low.  Daughter stated PCP thinks these medications may be affecting patient's kidney and wants medication change.

## 2023-10-08 ENCOUNTER — Telehealth: Payer: Self-pay | Admitting: Family Medicine

## 2023-10-08 NOTE — Telephone Encounter (Signed)
Copied from CRM [id number]. Topic: General - Call Back - No Documentation >> Oct 08, 2023  2:32 PM Chantha C wrote: Reason for CRM: Isaiah Serge Ph: [phone] is calling back, someone left her a message regarding the patient whom is her father. Informed Dorene Grebe, the staff will send a message to nursing to contact her back since they're will patients right now.

## 2023-10-08 NOTE — Telephone Encounter (Signed)
NO CONTACT INFO

## 2023-10-08 NOTE — Telephone Encounter (Signed)
Gave verbal okay to speech therapist verbal request. Spoke with [place] at [facility].

## 2023-10-08 NOTE — Telephone Encounter (Signed)
Copied from CRM [id number]. Topic: Clinical - Home Health Verbal Orders >> Sep 28, 2023  9:46 AM [name] wrote: Please notify home health of verbal order for speech therapy. Follow up on Speech therapy order.

## 2023-10-09 DIAGNOSIS — I5022 Chronic systolic (congestive) heart failure: Secondary | ICD-10-CM | POA: Diagnosis not present

## 2023-10-12 NOTE — Telephone Encounter (Signed)
Copied from CRM [id number]. Topic: Clinical - Home Health Verbal Orders >> Sep 23, 2023 12:42 PM Mosetta Putt H wrote: Reason for CRM: verbal orders for speech therapy  Please contact home health to add speech therapy

## 2023-10-14 DIAGNOSIS — Z9981 Dependence on supplemental oxygen: Secondary | ICD-10-CM | POA: Diagnosis not present

## 2023-10-14 DIAGNOSIS — M1990 Unspecified osteoarthritis, unspecified site: Secondary | ICD-10-CM | POA: Diagnosis not present

## 2023-10-14 DIAGNOSIS — I69322 Dysarthria following cerebral infarction: Secondary | ICD-10-CM | POA: Diagnosis not present

## 2023-10-14 DIAGNOSIS — I4891 Unspecified atrial fibrillation: Secondary | ICD-10-CM | POA: Diagnosis not present

## 2023-10-14 DIAGNOSIS — Z96641 Presence of right artificial hip joint: Secondary | ICD-10-CM | POA: Diagnosis not present

## 2023-10-14 DIAGNOSIS — N179 Acute kidney failure, unspecified: Secondary | ICD-10-CM | POA: Diagnosis not present

## 2023-10-14 DIAGNOSIS — E876 Hypokalemia: Secondary | ICD-10-CM | POA: Diagnosis not present

## 2023-10-14 DIAGNOSIS — R7303 Prediabetes: Secondary | ICD-10-CM | POA: Diagnosis not present

## 2023-10-14 DIAGNOSIS — N1831 Chronic kidney disease, stage 3a: Secondary | ICD-10-CM | POA: Diagnosis not present

## 2023-10-14 DIAGNOSIS — K219 Gastro-esophageal reflux disease without esophagitis: Secondary | ICD-10-CM | POA: Diagnosis not present

## 2023-10-14 DIAGNOSIS — E782 Mixed hyperlipidemia: Secondary | ICD-10-CM | POA: Diagnosis not present

## 2023-10-14 DIAGNOSIS — J181 Lobar pneumonia, unspecified organism: Secondary | ICD-10-CM | POA: Diagnosis not present

## 2023-10-14 DIAGNOSIS — I130 Hypertensive heart and chronic kidney disease with heart failure and stage 1 through stage 4 chronic kidney disease, or unspecified chronic kidney disease: Secondary | ICD-10-CM | POA: Diagnosis not present

## 2023-10-14 DIAGNOSIS — I5041 Acute combined systolic (congestive) and diastolic (congestive) heart failure: Secondary | ICD-10-CM | POA: Diagnosis not present

## 2023-10-14 DIAGNOSIS — I69320 Aphasia following cerebral infarction: Secondary | ICD-10-CM | POA: Diagnosis not present

## 2023-10-14 DIAGNOSIS — J9601 Acute respiratory failure with hypoxia: Secondary | ICD-10-CM | POA: Diagnosis not present

## 2023-10-14 DIAGNOSIS — I69351 Hemiplegia and hemiparesis following cerebral infarction affecting right dominant side: Secondary | ICD-10-CM | POA: Diagnosis not present

## 2023-10-14 DIAGNOSIS — I69328 Other speech and language deficits following cerebral infarction: Secondary | ICD-10-CM | POA: Diagnosis not present

## 2023-10-14 DIAGNOSIS — H409 Unspecified glaucoma: Secondary | ICD-10-CM | POA: Diagnosis not present

## 2023-10-14 DIAGNOSIS — Z7982 Long term (current) use of aspirin: Secondary | ICD-10-CM | POA: Diagnosis not present

## 2023-10-14 DIAGNOSIS — J440 Chronic obstructive pulmonary disease with acute lower respiratory infection: Secondary | ICD-10-CM | POA: Diagnosis not present

## 2023-10-14 DIAGNOSIS — Z8546 Personal history of malignant neoplasm of prostate: Secondary | ICD-10-CM | POA: Diagnosis not present

## 2023-10-14 DIAGNOSIS — Z7901 Long term (current) use of anticoagulants: Secondary | ICD-10-CM | POA: Diagnosis not present

## 2023-10-14 DIAGNOSIS — I69391 Dysphagia following cerebral infarction: Secondary | ICD-10-CM | POA: Diagnosis not present

## 2023-10-19 ENCOUNTER — Ambulatory Visit (INDEPENDENT_AMBULATORY_CARE_PROVIDER_SITE_OTHER): Payer: Medicare HMO | Admitting: Family Medicine

## 2023-10-19 ENCOUNTER — Encounter: Payer: Self-pay | Admitting: Family Medicine

## 2023-10-19 VITALS — BP 155/56 | HR 60 | Temp 98.3°F | Ht 72.0 in | Wt 200.2 lb

## 2023-10-19 DIAGNOSIS — Z23 Encounter for immunization: Secondary | ICD-10-CM

## 2023-10-19 DIAGNOSIS — I69390 Apraxia following cerebral infarction: Secondary | ICD-10-CM | POA: Diagnosis not present

## 2023-10-19 DIAGNOSIS — R29810 Facial weakness: Secondary | ICD-10-CM | POA: Diagnosis not present

## 2023-10-19 DIAGNOSIS — I69998 Other sequelae following unspecified cerebrovascular disease: Secondary | ICD-10-CM

## 2023-10-19 DIAGNOSIS — R531 Weakness: Secondary | ICD-10-CM

## 2023-10-19 NOTE — Progress Notes (Signed)
Subjective:  Patient ID: Joshua Najjar., male    DOB: [birth year]  Age: 82 y.o. MRN: [id number]  CC: No chief complaint on file.   HPI Joshua Compton. presents for follow up of stroke rehab. Not getting consistent PT, OT & Speech Tx. He is experiencing weakness of the right upper extremity. He cannot speak clearly and right face is weak.   The patient tried to DC Lasix, but swelling came back a few days later. Restarted the Lasix 1 week ago with good resolution. He Continues to have low BP, however his wife states his cardiologist wants it low due to his CHF.      10/19/2023    1:08 PM 07/28/2023    8:57 AM 04/30/2023    2:17 PM  Depression screen PHQ 2/9  Decreased Interest 0 0 0  Down, Depressed, Hopeless 0 0 0  PHQ - 2 Score 0 0 0    History Joshua Compton has a past medical history of Arthritis, Cancer (HCC), GERD (gastroesophageal reflux disease), Glaucoma, Hyperlipidemia, Hypertension, and Stroke (HCC) (07/2023).   He has a past surgical history that includes Back surgery; Neck surgery; Transurethral resection of prostate; Hernia repair (11-06-2009); Rotator cuff repair (Left, jan 2016); Colonoscopy; Total hip arthroplasty (Right, 04/20/2020); and Total hip arthroplasty (Left, 01/07/2021).   His family history includes Cancer in his brother, brother, and mother; Heart disease in his father.He reports that he quit smoking about 18 years ago. His smoking use included cigarettes. He started smoking about 48 years ago. He has a 15 pack-year smoking history. He has never used smokeless tobacco. He reports that he does not drink alcohol and does not use drugs.    ROS Review of Systems  Constitutional:  Negative for fever.  Respiratory:  Negative for shortness of breath.   Cardiovascular:  Negative for chest pain.  Musculoskeletal:  Negative for arthralgias.  Skin:  Negative for rash.    Objective:  BP (!) 155/56   Pulse 60   Temp 98.3 F (36.8 C)   Ht 6' (1.829 m)    Wt 200 lb 3.2 oz (90.8 kg)   SpO2 95%   BMI 27.15 kg/m   BP Readings from Last 3 Encounters:  10/19/23 (!) 155/56  10/01/23 128/68  09/22/23 (!) 141/64    Wt Readings from Last 3 Encounters:  10/19/23 200 lb 3.2 oz (90.8 kg)  10/01/23 202 lb (91.6 kg)  09/22/23 198 lb 3.2 oz (89.9 kg)     Physical Exam Vitals reviewed.  Constitutional:      Appearance: He is well-developed.  HENT:     Head: Normocephalic and atraumatic.     Right Ear: External ear normal.     Left Ear: External ear normal.     Mouth/Throat:     Pharynx: No oropharyngeal exudate or posterior oropharyngeal erythema.  Eyes:     Pupils: Pupils are equal, round, and reactive to light.  Cardiovascular:     Rate and Rhythm: Normal rate and regular rhythm.     Heart sounds: No murmur heard. Pulmonary:     Effort: No respiratory distress.     Breath sounds: Normal breath sounds.  Musculoskeletal:     Cervical back: Normal range of motion and neck supple.  Neurological:     Mental Status: He is alert and oriented to person, place, and time.     Motor: Weakness (RUE for all ROM, grip is very ewak, 2/5 for strength) present.  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 maintain his dorzolamide-timolol, rOPINIRole, albuterol, pantoprazole, amiodarone, furosemide, sacubitril-valsartan, apixaban, fluticasone, atorvastatin, cholecalciferol, metoprolol succinate, and Farxiga.  Allergies as of 10/19/2023       Reactions   Iodinated Contrast Media Anaphylaxis   Tongue swelled up when he had MRI in [place] Intubated for airway protection after oral and throat swelling   Multihance [gadobenate] Swelling   Tongue swelled up when he had MRI in [facility]   Pravastatin Other (See Comments)   Muscle aches   Pravastatin Sodium    Muscle aches        Medication List        Accurate as of October 19, 2023  3:14 PM. If you have any questions, ask your nurse or doctor.          albuterol 108 (90 Base) MCG/ACT inhaler Commonly known as: VENTOLIN HFA Inhale 1-2 puffs into the lungs every 6 (six) hours as needed for wheezing or shortness of breath.   amiodarone 200 MG tablet Commonly known as: PACERONE Take 1 tablet (200 mg total) by mouth 2 (two) times daily. X 20 days, then once daily starting 09/28/23   apixaban 5 MG Tabs tablet Commonly known as: ELIQUIS Take 1 tablet (5 mg total) by mouth 2 (two) times daily.   atorvastatin 40 MG tablet Commonly known as: LIPITOR TAKE 1 TABLET BY MOUTH EVERYDAY AT BEDTIME   cholecalciferol 25 MCG (1000 UNIT) tablet Commonly known as: VITAMIN D3 TAKE 1 TABLET BY MOUTH EVERY DAY   dorzolamide-timolol 2-0.5 % ophthalmic solution Commonly known as: COSOPT Place 1 drop into both eyes daily.   Farxiga 10 MG Tabs tablet Generic drug: dapagliflozin  propanediol TAKE 1 TABLET BY MOUTH EVERY DAY   fluticasone 50 MCG/ACT nasal spray Commonly known as: FLONASE SPRAY 2 SPRAYS INTO EACH NOSTRIL EVERY DAY   furosemide 40 MG tablet Commonly known as: LASIX Take 1 tablet (40 mg total) by mouth daily.   metoprolol succinate 25 MG 24 hr tablet Commonly known as: TOPROL-XL Take 0.5 tablets (12.5 mg total)  by mouth daily. For blood pressure control   pantoprazole 40 MG tablet Commonly known as: PROTONIX Take 1 tablet (40 mg total) by mouth daily. For heartburn   rOPINIRole 1 MG tablet Commonly known as: REQUIP Take 1 tablet (1 mg total) by mouth 3 (three) times daily. Plus an extra tablet at bedtime to total four a day   sacubitril-valsartan 24-26 MG Commonly known as: ENTRESTO Take 1 tablet by mouth 2 (two) times daily.         Follow-up: Return in about 6 weeks (around 11/30/2023) for stroke rehab, CHF.  [name], M.D.

## 2023-10-20 LAB — CMP14+EGFR
ALT: 21 [IU]/L (ref 0–44)
AST: 21 [IU]/L (ref 0–40)
Albumin: 4.3 g/dL (ref 3.7–4.7)
Alkaline Phosphatase: 115 [IU]/L (ref 44–121)
BUN/Creatinine Ratio: 11 (ref 10–24)
BUN: 16 mg/dL (ref 8–27)
Bilirubin Total: 0.7 mg/dL (ref 0.0–1.2)
CO2: 27 mmol/L (ref 20–29)
Calcium: 9.9 mg/dL (ref 8.6–10.2)
Chloride: 104 mmol/L (ref 96–106)
Creatinine, Ser: 1.52 mg/dL — ABNORMAL HIGH (ref 0.76–1.27)
Globulin, Total: 2.7 g/dL (ref 1.5–4.5)
Glucose: 102 mg/dL — ABNORMAL HIGH (ref 70–99)
Potassium: 4 mmol/L (ref 3.5–5.2)
Sodium: 145 mmol/L — ABNORMAL HIGH (ref 134–144)
Total Protein: 7 g/dL (ref 6.0–8.5)
eGFR: 45 mL/min/{1.73_m2} — ABNORMAL LOW (ref >59)

## 2023-10-20 LAB — CBC WITH DIFFERENTIAL/PLATELET
Basophils Absolute: 0.1 10*3/uL (ref 0.0–0.2)
Basos: 1 %
EOS (ABSOLUTE): 0.2 10*3/uL (ref 0.0–0.4)
Eos: 4 %
Hematocrit: 41 % (ref 37.5–51.0)
Hemoglobin: 13.5 g/dL (ref 13.0–17.7)
Immature Grans (Abs): 0 10*3/uL (ref 0.0–0.1)
Immature Granulocytes: 0 %
Lymphocytes Absolute: 1.5 10*3/uL (ref 0.7–3.1)
Lymphs: 30 %
MCH: 30.5 pg (ref 26.6–33.0)
MCHC: 32.9 g/dL (ref 31.5–35.7)
MCV: 93 fL (ref 79–97)
Monocytes Absolute: 0.5 10*3/uL (ref 0.1–0.9)
Monocytes: 9 %
Neutrophils Absolute: 2.9 10*3/uL (ref 1.4–7.0)
Neutrophils: 56 %
Platelets: 241 10*3/uL (ref 150–450)
RBC: 4.43 x10E6/uL (ref 4.14–5.80)
RDW: 13.2 % (ref 11.6–15.4)
WBC: 5.1 10*3/uL (ref 3.4–10.8)

## 2023-10-21 DIAGNOSIS — I69351 Hemiplegia and hemiparesis following cerebral infarction affecting right dominant side: Secondary | ICD-10-CM | POA: Diagnosis not present

## 2023-10-21 DIAGNOSIS — I4891 Unspecified atrial fibrillation: Secondary | ICD-10-CM | POA: Diagnosis not present

## 2023-10-21 DIAGNOSIS — I69391 Dysphagia following cerebral infarction: Secondary | ICD-10-CM | POA: Diagnosis not present

## 2023-10-21 DIAGNOSIS — E782 Mixed hyperlipidemia: Secondary | ICD-10-CM | POA: Diagnosis not present

## 2023-10-21 DIAGNOSIS — Z7982 Long term (current) use of aspirin: Secondary | ICD-10-CM | POA: Diagnosis not present

## 2023-10-21 DIAGNOSIS — J9601 Acute respiratory failure with hypoxia: Secondary | ICD-10-CM | POA: Diagnosis not present

## 2023-10-21 DIAGNOSIS — N1831 Chronic kidney disease, stage 3a: Secondary | ICD-10-CM | POA: Diagnosis not present

## 2023-10-21 DIAGNOSIS — I69328 Other speech and language deficits following cerebral infarction: Secondary | ICD-10-CM | POA: Diagnosis not present

## 2023-10-21 DIAGNOSIS — K219 Gastro-esophageal reflux disease without esophagitis: Secondary | ICD-10-CM | POA: Diagnosis not present

## 2023-10-21 DIAGNOSIS — Z96641 Presence of right artificial hip joint: Secondary | ICD-10-CM | POA: Diagnosis not present

## 2023-10-21 DIAGNOSIS — Z9981 Dependence on supplemental oxygen: Secondary | ICD-10-CM | POA: Diagnosis not present

## 2023-10-21 DIAGNOSIS — I69320 Aphasia following cerebral infarction: Secondary | ICD-10-CM | POA: Diagnosis not present

## 2023-10-21 DIAGNOSIS — E876 Hypokalemia: Secondary | ICD-10-CM | POA: Diagnosis not present

## 2023-10-21 DIAGNOSIS — M1990 Unspecified osteoarthritis, unspecified site: Secondary | ICD-10-CM | POA: Diagnosis not present

## 2023-10-21 DIAGNOSIS — J181 Lobar pneumonia, unspecified organism: Secondary | ICD-10-CM | POA: Diagnosis not present

## 2023-10-21 DIAGNOSIS — N179 Acute kidney failure, unspecified: Secondary | ICD-10-CM | POA: Diagnosis not present

## 2023-10-21 DIAGNOSIS — R7303 Prediabetes: Secondary | ICD-10-CM | POA: Diagnosis not present

## 2023-10-21 DIAGNOSIS — H409 Unspecified glaucoma: Secondary | ICD-10-CM | POA: Diagnosis not present

## 2023-10-21 DIAGNOSIS — I69322 Dysarthria following cerebral infarction: Secondary | ICD-10-CM | POA: Diagnosis not present

## 2023-10-21 DIAGNOSIS — J440 Chronic obstructive pulmonary disease with acute lower respiratory infection: Secondary | ICD-10-CM | POA: Diagnosis not present

## 2023-10-21 DIAGNOSIS — I130 Hypertensive heart and chronic kidney disease with heart failure and stage 1 through stage 4 chronic kidney disease, or unspecified chronic kidney disease: Secondary | ICD-10-CM | POA: Diagnosis not present

## 2023-10-21 DIAGNOSIS — Z7901 Long term (current) use of anticoagulants: Secondary | ICD-10-CM | POA: Diagnosis not present

## 2023-10-21 DIAGNOSIS — I5041 Acute combined systolic (congestive) and diastolic (congestive) heart failure: Secondary | ICD-10-CM | POA: Diagnosis not present

## 2023-10-21 DIAGNOSIS — Z8546 Personal history of malignant neoplasm of prostate: Secondary | ICD-10-CM | POA: Diagnosis not present

## 2023-10-22 ENCOUNTER — Other Ambulatory Visit: Payer: Self-pay | Admitting: Family Medicine

## 2023-10-29 ENCOUNTER — Encounter (HOSPITAL_COMMUNITY): Payer: Self-pay

## 2023-10-29 ENCOUNTER — Other Ambulatory Visit: Payer: Self-pay

## 2023-10-29 ENCOUNTER — Ambulatory Visit (HOSPITAL_COMMUNITY): Payer: Medicare HMO | Attending: Family Medicine

## 2023-10-29 DIAGNOSIS — I69390 Apraxia following cerebral infarction: Secondary | ICD-10-CM | POA: Diagnosis not present

## 2023-10-29 DIAGNOSIS — R29818 Other symptoms and signs involving the nervous system: Secondary | ICD-10-CM | POA: Diagnosis not present

## 2023-10-29 DIAGNOSIS — R471 Dysarthria and anarthria: Secondary | ICD-10-CM | POA: Insufficient documentation

## 2023-10-29 DIAGNOSIS — R531 Weakness: Secondary | ICD-10-CM | POA: Diagnosis not present

## 2023-10-29 DIAGNOSIS — R278 Other lack of coordination: Secondary | ICD-10-CM | POA: Diagnosis not present

## 2023-10-29 DIAGNOSIS — I69998 Other sequelae following unspecified cerebrovascular disease: Secondary | ICD-10-CM | POA: Insufficient documentation

## 2023-10-29 DIAGNOSIS — R29810 Facial weakness: Secondary | ICD-10-CM | POA: Insufficient documentation

## 2023-10-29 NOTE — Therapy (Signed)
OUTPATIENT PHYSICAL THERAPY NEURO EVALUATION   Patient Name: Joshua Compton. MRN: [id number] DOB:08-Feb-1941, 82 y.o., male 61 Date: 10/29/2023   PCP: [name], MD REFERRING PROVIDER: [name], MD  END OF SESSION:  PT End of Session - 10/29/23 1507     Visit Number 1    Authorization Type [employer] Medicare    Authorization Time Period Evaluation Only    PT Start Time 1437    PT Stop Time 1500    PT Time Calculation (min) 23 min    Equipment Utilized During Treatment Gait belt    Activity Tolerance Patient tolerated treatment well    Behavior During Therapy WFL for tasks assessed/performed             Past Medical History:  Diagnosis Date   Arthritis    Cancer (HCC)    prostate ca   seed implants   GERD (gastroesophageal reflux disease)    Glaucoma    Hyperlipidemia    Hypertension    dr Modesto Charon     [facility]   fm   Stroke [facility]) 07/2023   Past Surgical History:  Procedure Laterality Date   BACK SURGERY     x2   COLONOSCOPY     HERNIA REPAIR  11-06-2009   left inguinal repair    NECK SURGERY     ROTATOR CUFF REPAIR Left jan 2016   TOTAL HIP ARTHROPLASTY Right 04/20/2020   Procedure: RIGHT TOTAL HIP ARTHROPLASTY ANTERIOR APPROACH;  Surgeon: Tarry Kos, MD;  Location: [facility] OR;  Service: Orthopedics;  Laterality: Right;   TOTAL HIP ARTHROPLASTY Left 01/07/2021   Procedure: LEFT TOTAL HIP ARTHROPLASTY ANTERIOR APPROACH;  Surgeon: Tarry Kos, MD;  Location: [facility] OR;  Service: Orthopedics;  Laterality: Left;   TRANSURETHRAL RESECTION OF PROSTATE     Patient Active Problem List   Diagnosis Date Noted   High risk medication use 09/18/2023   Vitamin D deficiency 09/18/2023   Supplemental oxygen dependent 09/18/2023   Acute HFrEF (heart failure with reduced ejection fraction) (HCC) 09/08/2023   PAF (paroxysmal atrial fibrillation) (HCC) 09/07/2023   Acute respiratory failure with hypoxia (HCC) 09/05/2023   Acute heart failure with preserved  ejection fraction (HFpEF) (HCC) 09/05/2023   Lobar pneumonia (HCC) 09/05/2023   Cerebrovascular accident (CVA) due to embolism of left middle cerebral artery (HCC) 08/10/2023   Paresthesia of both feet 07/28/2023   Gastroesophageal reflux disease with esophagitis without hemorrhage 09/10/2021   Primary insomnia 09/10/2021   Vasculogenic erectile dysfunction 09/10/2021   Status post total replacement of left hip 01/07/2021   Primary osteoarthritis of left hip 01/06/2021   Leg cramps 12/11/2020   Thyromegaly 05/19/2017   Neoplasm of scalp 12/08/2013   Rhinitis 12/08/2013   Cough 12/08/2013   Hyperlipidemia    Cancer (HCC)    Arthritis    Severe stage glaucoma 03/26/2012   Nuclear sclerosis 03/26/2012   Primary open angle glaucoma 10/27/2011   Essential hypertension 02/04/2011   Degenerative disc disease 02/04/2011   Gastrointestinal bleed 02/04/2011   Anemia 02/04/2011   Prostate cancer (HCC) 02/04/2011    ONSET DATE: 08/07/2023  REFERRING DIAG:  R53.1 (ICD-10-CM) - Weakness of right side of body  R29.810 (ICD-10-CM) - Weakness on right side of face  I69.390 (ICD-10-CM) - 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 Pt had stroke on setpember 19th. Living with wife currently. [facility]. Left sided CVA impacting the R side. Pt reports practically that he is currently independent. Biggest problem is trouble gripping.  Pt accompanied by: self  PERTINENT HISTORY:   PAIN:  Are you having  pain? No  PRECAUTIONS: None  RED FLAGS: None   WEIGHT BEARING RESTRICTIONS: No  FALLS: Has patient fallen in last 6 months? No  LIVING ENVIRONMENT: Lives with: lives with their spouse Lives in: House/apartment Stairs:  Two sets with 3-4 steps, rails to hold on to.  Has following equipment at home: Walker - 2 wheeled  PLOF: Independent  PATIENT GOALS: "get arm and speech better"  OBJECTIVE:  Note: Objective measures were completed at Evaluation unless otherwise noted.  DIAGNOSTIC FINDINGS:   MRI Head WO Contrast 08/07/23 1. Acute to subacute infarction within the posterior aspect of the left frontal lobe. Small amount of associated petechial hemorrhage.   COGNITION: Overall cognitive status: Within functional limits for tasks assessed   SENSATION: WFL  COORDINATION: Shin/heel test: intact   POSTURE: No Significant postural limitations  LOWER EXTREMITY ROM:     Active  Right Eval Left Eval  Hip flexion    Hip extension    Hip abduction    Hip adduction    Hip internal rotation    Hip external rotation    Knee flexion    Knee extension    Ankle dorsiflexion    Ankle plantarflexion    Ankle inversion    Ankle eversion     (Blank rows = not tested)  LOWER EXTREMITY MMT:    MMT Right Eval Left Eval  Hip flexion    Hip extension    Hip abduction    Hip adduction    Hip internal rotation    Hip external rotation    Knee flexion 5 5  Knee extension 4 4  Ankle dorsiflexion    Ankle plantarflexion    Ankle inversion    Ankle eversion    (Blank rows = not tested)  TRANSFERS: Assistive device utilized: None  Sit to stand: Complete Independence Stand to sit: Complete Independence Chair to chair: Complete Independence   STAIRS: Level of Assistance: Complete Independence Stair Negotiation Technique: Alternating Pattern  with No Rails Number of Stairs: 4  Height of Stairs: 7in  Comments: Safe, no loss of balance  GAIT: Gait pattern:  WFL Distance walked: 179ft Assistive device utilized: None Level of assistance: Complete Independence Comments: reduced RUE swing during alternating stance and swing phase, no lower extremity deficits noted.   FUNCTIONAL TESTS:  DGI 1. Gait level surface (3) Normal: Walks 20', no assistive devices, good sped, no evidence for imbalance, normal gait pattern 2. Change in gait speed (3) Normal: Able to smoothly change walking speed without loss of balance or gait deviation. Shows a significant difference in walking speeds between normal, fast and slow speeds. 3. Gait with horizontal head turns (2) Mild Impairment: Performs head turns smoothly with slight change in gait velocity, i.e., minor disruption to smooth gait path or uses walking aid. 4. Gait with vertical head turns (2) Mild Impairment: Performs head turns smoothly with slight change in gait velocity, i.e., minor disruption to smooth gait path or uses walking aid. 5. Gait and pivot turn (3) Normal: Pivot turns safely within 3 seconds and stops quickly with no loss of balance. 6. Step over obstacle (3) Normal: Is able to step over the box without changing gait speed, no evidence of imbalance. 7. Step around obstacles (3) Normal: Is able to walk around cones safely without changing gait speed; no evidence of imbalance. 8. Stairs (3) Normal: Alternating feet, no rail.  TOTAL SCORE: 22 / 24  30 Second Chair Test: 11x TUG: 11 seconds   TODAY'S TREATMENT:                                                                                                                              DATE: 10/29/2023 PT Evaluation   PATIENT EDUCATION: Education details: PT Evaluation, findings, prognosis, frequency, attendance policy, and HEP if given.  Person educated: Patient Education method: Medical illustrator Education comprehension: verbalized understanding  HOME EXERCISE PROGRAM: None.   GOALS: Goals reviewed with patient?  No  SHORT TERM GOALS: Target date: 10/29/2023  Pt will be understand PT POC and participate as appropriate during evaluation. .  Baseline:See objective.  Goal status: INITIAL    ASSESSMENT:  CLINICAL IMPRESSION: Patient is a 82 y.o. 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 Pt demonstrating symmetrical BLE strength with outcomes point to no increased risk of fall or deficits currently.  Pt will not be added on to skilled physical therapy caseload as pt is functional at baseline regards to ambulation, balance, and transfers. Pt's biggest concerns are RUE and speech.    OBJECTIVE IMPAIRMENTS:  No major deficits.  Marland Kitchen  EVALUATION COMPLEXITY: Low  PLAN:  PT FREQUENCY: one time visit  PT DURATION:  1 sessions  PLANNED INTERVENTIONS: 40981- PT Re-evaluation  PLAN FOR NEXT SESSION: DC  Elie Goody, DPT [facility] [place] 336 [phone] office  Nelida Meuse, PT 10/29/2023, 3:08 PM

## 2023-10-30 ENCOUNTER — Ambulatory Visit (HOSPITAL_COMMUNITY): Payer: Medicare HMO | Admitting: Occupational Therapy

## 2023-10-30 ENCOUNTER — Encounter (HOSPITAL_COMMUNITY): Payer: Self-pay | Admitting: Occupational Therapy

## 2023-10-30 DIAGNOSIS — R29818 Other symptoms and signs involving the nervous system: Secondary | ICD-10-CM | POA: Diagnosis not present

## 2023-10-30 DIAGNOSIS — R531 Weakness: Secondary | ICD-10-CM | POA: Diagnosis not present

## 2023-10-30 DIAGNOSIS — R278 Other lack of coordination: Secondary | ICD-10-CM

## 2023-10-30 DIAGNOSIS — R29810 Facial weakness: Secondary | ICD-10-CM | POA: Diagnosis not present

## 2023-10-30 DIAGNOSIS — I69390 Apraxia following cerebral infarction: Secondary | ICD-10-CM | POA: Diagnosis not present

## 2023-10-30 DIAGNOSIS — R471 Dysarthria and anarthria: Secondary | ICD-10-CM | POA: Diagnosis not present

## 2023-10-30 DIAGNOSIS — I69998 Other sequelae following unspecified cerebrovascular disease: Secondary | ICD-10-CM | POA: Diagnosis not present

## 2023-10-30 NOTE — Patient Instructions (Signed)

## 2023-10-30 NOTE — Therapy (Unsigned)
OUTPATIENT OCCUPATIONAL THERAPY NEURO EVALUATION  Patient Name: Joshua Compton. MRN: [id number] DOB:08/20/1941, 82 y.o., male Today's Date: 11/02/2023    END OF SESSION:  OT End of Session - 11/02/23 1231     Visit Number 1    Number of Visits 8    Date for OT Re-Evaluation 11/27/23    Authorization Type [employer] Medicare, $20 copay    Authorization Time Period no auth required    Progress Note Due on Visit 10    OT Start Time 1345    OT Stop Time 1415    OT Time Calculation (min) 30 min    Activity Tolerance Patient tolerated treatment well    Behavior During Therapy WFL for tasks assessed/performed             Past Medical History:  Diagnosis Date   Arthritis    Cancer (HCC)    prostate ca   seed implants   GERD (gastroesophageal reflux disease)    Glaucoma    Hyperlipidemia    Hypertension    dr Modesto Charon     [facility]   fm   Stroke [facility]) 07/2023   Past Surgical History:  Procedure Laterality Date   BACK SURGERY     x2   COLONOSCOPY     HERNIA REPAIR  11-06-2009   left inguinal repair    NECK SURGERY     ROTATOR CUFF REPAIR Left jan 2016   TOTAL HIP ARTHROPLASTY Right 04/20/2020   Procedure: RIGHT TOTAL HIP ARTHROPLASTY ANTERIOR APPROACH;  Surgeon: Tarry Kos, MD;  Location: [facility] OR;  Service: Orthopedics;  Laterality: Right;   TOTAL HIP ARTHROPLASTY Left 01/07/2021   Procedure: LEFT TOTAL HIP ARTHROPLASTY ANTERIOR APPROACH;  Surgeon: Tarry Kos, MD;  Location: [facility] OR;  Service: Orthopedics;  Laterality: Left;   TRANSURETHRAL RESECTION OF PROSTATE     Patient Active Problem List   Diagnosis Date Noted   High risk medication use 09/18/2023   Vitamin D deficiency 09/18/2023   Supplemental oxygen dependent 09/18/2023   Acute HFrEF (heart failure with reduced ejection fraction) (HCC) 09/08/2023   PAF (paroxysmal atrial fibrillation) (HCC) 09/07/2023   Acute respiratory failure with hypoxia (HCC) 09/05/2023   Acute heart failure with preserved ejection  fraction (HFpEF) (HCC) 09/05/2023   Lobar pneumonia (HCC) 09/05/2023   Cerebrovascular accident (CVA) due to embolism of left middle cerebral artery (HCC) 08/10/2023   Paresthesia of both feet 07/28/2023   Gastroesophageal reflux disease with esophagitis without hemorrhage 09/10/2021   Primary insomnia 09/10/2021   Vasculogenic erectile dysfunction 09/10/2021   Status post total replacement of left hip 01/07/2021   Primary osteoarthritis of left hip 01/06/2021   Leg cramps 12/11/2020   Thyromegaly 05/19/2017   Neoplasm of scalp 12/08/2013   Rhinitis 12/08/2013   Cough 12/08/2013   Hyperlipidemia    Cancer (HCC)    Arthritis    Severe stage glaucoma 03/26/2012   Nuclear sclerosis 03/26/2012   Primary open angle glaucoma 10/27/2011   Essential hypertension 02/04/2011   Degenerative disc disease 02/04/2011   Gastrointestinal bleed 02/04/2011   Anemia 02/04/2011   Prostate cancer (HCC) 02/04/2011   PCP: Dr. [name] REFERRING PROVIDER: Dr. [name]  ONSET DATE: 08/07/23  REFERRING DIAG: I69.998,R53.1 (ICD-10-CM) - Weakness as late effect of cerebrovascular accident (CVA)   THERAPY DIAG:  Other symptoms and signs involving the nervous system  Other lack of coordination  Rationale for Evaluation and Treatment: Rehabilitation  SUBJECTIVE:   SUBJECTIVE STATEMENT: S: I'm doing pretty  good except for my hand.  Pt accompanied by: self  PERTINENT HISTORY: Pt is an 82 y/o male who reports he had a stroke on September 19th. From [facility]. Left sided CVA impacting the R side.   PRECAUTIONS: Fall  WEIGHT BEARING RESTRICTIONS: No  PAIN:  Are you having pain? No  FALLS: Has patient fallen in last 6 months? No  PLOF: Independent  PATIENT GOALS: To get stronger and resume independence in ADLs.   OBJECTIVE:  Note: Objective measures were completed at Evaluation unless otherwise noted.  HAND DOMINANCE: Right  ADLs: Overall ADLs: Pt reports difficulty with  gripping items, pt is unable to use a toothbrush or razor, is using LUE for ADLs. Pt is unable to tie his shoes, has not tried to hold a pencil. Using left hand to feed himself.  Pt enjoys golfing, fishing, and yardwork.    FUNCTIONAL OUTCOME MEASURES: FOTO: 78/100   UPPER EXTREMITY ROM:      A/ROM of BUE is [facility]  UPPER EXTREMITY MMT:     MMT Right eval  Shoulder flexion 5/5  Shoulder abduction 4/5 (same as LUE)  Shoulder internal rotation 5/5  Shoulder external rotation 5/5  Elbow flexion 5/5  Elbow extension 5/5  Wrist flexion 4/5  Wrist extension 3+/5  Wrist ulnar deviation 3+/5  Wrist radial deviation 3+/5  Wrist pronation 5/5  Wrist supination 5/5  (Blank rows = not tested)  HAND FUNCTION: Grip strength: Right: 22 lbs; Left: 110 lbs, Lateral pinch: Right: 4 lbs, Left: 26 lbs, and 3 point pinch: Right: unable lbs, Left: 17 lbs  COORDINATION: 9 Hole Peg test: Right: 1'48" with set-up sec; Left: 29.94 sec  SENSATION: WFL  EDEMA: mild edema in hand  COGNITION: Overall cognitive status: Within functional limits for tasks assessed  VISION: Subjective report: No change Baseline vision: No visual deficits Visual history: glaucoma   TODAY'S TREATMENT:                                                                                                                              DATE: N/A-eval only    PATIENT EDUCATION: Education details: theraputty grip and pinch strengthening Person educated: Patient Education method: Explanation, Demonstration, and Handouts Education comprehension: verbalized understanding and returned demonstration  HOME EXERCISE PROGRAM: Eval: yellow theraputty-grip and pinch strengthening   GOALS: Goals reviewed with patient? Yes  SHORT TERM GOALS: Target date: 11/27/23  Pt will be provided with and educated on HEP to improve mobility and use of RUE required for ADL completion.   Goal status: INITIAL  2.  Pt will increase RUE strength  to 4+/5 to improve ability to perform yardwork and fishing activities using RUE as dominant.   Goal status: INITIAL  3.  Pt will increase right grip strength by 15+ pounds and pinch strength by 4+ pounds to improve ability to grip and utilize tools such as a toothbrush or razor with the right hand.   Goal  status: INITIAL  4.  Pt will improve RUE coordination required for tying shoes and operate fishing gear by completing 9 hole peg test in under 1'.   Goal status: INITIAL    ASSESSMENT:  CLINICAL IMPRESSION: Patient is a 82 y.o. male who was seen today for occupational therapy evaluation s/p left CVA on 08/07/23. Pt presents with RUE weakness localized to wrist, hand, and digits. Pt unable to use RUE as dominant due to impaired strength and coordination.  Pt will benefit from skilled OT services to address deficits and improve use of RUE as dominant during functional task completion.   PERFORMANCE DEFICITS: in functional skills including ADLs, IADLs, coordination, dexterity, edema, tone, ROM, strength, and UE functional use  IMPAIRMENTS: are limiting patient from ADLs, IADLs, and leisure.   CO-MORBIDITIES: has no other co-morbidities that affects occupational performance. Patient will benefit from skilled OT to address above impairments and improve overall function.  MODIFICATION OR ASSISTANCE TO COMPLETE EVALUATION: No modification of tasks or assist necessary to complete an evaluation.  OT OCCUPATIONAL PROFILE AND HISTORY: Problem focused assessment: Including review of records relating to presenting problem.  CLINICAL DECISION MAKING: LOW - limited treatment options, no task modification necessary  REHAB POTENTIAL: Good  EVALUATION COMPLEXITY: Low    PLAN:  OT FREQUENCY: 2x/week  OT DURATION: 4 weeks  PLANNED INTERVENTIONS: 97168 OT Re-evaluation, 97535 self care/ADL training, 52841 therapeutic exercise, 97530 therapeutic activity, 97112 neuromuscular re-education, 97140  manual therapy, 97018 paraffin, 32440 electrical stimulation (manual), patient/family education, and DME and/or AE instructions  RECOMMENDED OTHER SERVICES: PT evaluation  CONSULTED AND AGREED WITH PLAN OF CARE: Patient  PLAN FOR NEXT SESSION: follow up on HEP, begin wrist and grip strengthening, coordination work   [employer], OTR/L  [phone] 11/02/2023, 12:33 PM

## 2023-11-02 ENCOUNTER — Encounter (HOSPITAL_COMMUNITY): Payer: Self-pay | Admitting: Occupational Therapy

## 2023-11-02 ENCOUNTER — Other Ambulatory Visit: Payer: Self-pay

## 2023-11-03 ENCOUNTER — Encounter (HOSPITAL_COMMUNITY): Payer: Self-pay | Admitting: Occupational Therapy

## 2023-11-03 ENCOUNTER — Ambulatory Visit (HOSPITAL_COMMUNITY): Payer: Medicare HMO | Admitting: Occupational Therapy

## 2023-11-03 DIAGNOSIS — R531 Weakness: Secondary | ICD-10-CM | POA: Diagnosis not present

## 2023-11-03 DIAGNOSIS — R471 Dysarthria and anarthria: Secondary | ICD-10-CM | POA: Diagnosis not present

## 2023-11-03 DIAGNOSIS — I69390 Apraxia following cerebral infarction: Secondary | ICD-10-CM | POA: Diagnosis not present

## 2023-11-03 DIAGNOSIS — I69998 Other sequelae following unspecified cerebrovascular disease: Secondary | ICD-10-CM | POA: Diagnosis not present

## 2023-11-03 DIAGNOSIS — R278 Other lack of coordination: Secondary | ICD-10-CM | POA: Diagnosis not present

## 2023-11-03 DIAGNOSIS — R29810 Facial weakness: Secondary | ICD-10-CM | POA: Diagnosis not present

## 2023-11-03 DIAGNOSIS — R29818 Other symptoms and signs involving the nervous system: Secondary | ICD-10-CM

## 2023-11-03 NOTE — Therapy (Signed)
OUTPATIENT OCCUPATIONAL THERAPY NEURO TREATMENT NOTE  Patient Name: Joshua Compton. MRN: [id number] DOB:[birth year], 82 y.o., male Today's Date: 11/03/2023    END OF SESSION:  OT End of Session - 11/03/23 1239     Visit Number 2    Number of Visits 8    Date for OT Re-Evaluation 11/27/23    Authorization Type [employer] Medicare, $20 copay    Authorization Time Period no auth required    Progress Note Due on Visit 10    OT Start Time 1155    OT Stop Time 1236    OT Time Calculation (min) 41 min    Activity Tolerance Patient tolerated treatment well    Behavior During Therapy WFL for tasks assessed/performed              Past Medical History:  Diagnosis Date   Arthritis    Cancer (HCC)    prostate ca   seed implants   GERD (gastroesophageal reflux disease)    Glaucoma    Hyperlipidemia    Hypertension    dr Modesto Charon     [facility]   fm   Stroke [facility]) 07/2023   Past Surgical History:  Procedure Laterality Date   BACK SURGERY     x2   COLONOSCOPY     HERNIA REPAIR  11-06-2009   left inguinal repair    NECK SURGERY     ROTATOR CUFF REPAIR Left jan 2016   TOTAL HIP ARTHROPLASTY Right 04/20/2020   Procedure: RIGHT TOTAL HIP ARTHROPLASTY ANTERIOR APPROACH;  Surgeon: Tarry Kos, MD;  Location: [facility] OR;  Service: Orthopedics;  Laterality: Right;   TOTAL HIP ARTHROPLASTY Left 01/07/2021   Procedure: LEFT TOTAL HIP ARTHROPLASTY ANTERIOR APPROACH;  Surgeon: Tarry Kos, MD;  Location: [facility] OR;  Service: Orthopedics;  Laterality: Left;   TRANSURETHRAL RESECTION OF PROSTATE     Patient Active Problem List   Diagnosis Date Noted   High risk medication use 09/18/2023   Vitamin D deficiency 09/18/2023   Supplemental oxygen dependent 09/18/2023   Acute HFrEF (heart failure with reduced ejection fraction) (HCC) 09/08/2023   PAF (paroxysmal atrial fibrillation) (HCC) 09/07/2023   Acute respiratory failure with hypoxia (HCC) 09/05/2023   Acute heart failure with preserved  ejection fraction (HFpEF) (HCC) 09/05/2023   Lobar pneumonia (HCC) 09/05/2023   Cerebrovascular accident (CVA) due to embolism of left middle cerebral artery (HCC) 08/10/2023   Paresthesia of both feet 07/28/2023   Gastroesophageal reflux disease with esophagitis without hemorrhage 09/10/2021   Primary insomnia 09/10/2021   Vasculogenic erectile dysfunction 09/10/2021   Status post total replacement of left hip 01/07/2021   Primary osteoarthritis of left hip 01/06/2021   Leg cramps 12/11/2020   Thyromegaly 05/19/2017   Neoplasm of scalp 12/08/2013   Rhinitis 12/08/2013   Cough 12/08/2013   Hyperlipidemia    Cancer (HCC)    Arthritis    Severe stage glaucoma 03/26/2012   Nuclear sclerosis 03/26/2012   Primary open angle glaucoma 10/27/2011   Essential hypertension 02/04/2011   Degenerative disc disease 02/04/2011   Gastrointestinal bleed 02/04/2011   Anemia 02/04/2011   Prostate cancer (HCC) 02/04/2011   PCP: Dr. [name] REFERRING PROVIDER: Dr. [name]  ONSET DATE: 08/07/23  REFERRING DIAG: I69.998,R53.1 (ICD-10-CM) - Weakness as late effect of cerebrovascular accident (CVA)   THERAPY DIAG:  Other lack of coordination  Other symptoms and signs involving the nervous system  Rationale for Evaluation and Treatment: Rehabilitation  SUBJECTIVE:   SUBJECTIVE STATEMENT: S: If  my hand weren't swollen, I'd be good.  Pt accompanied by: self  PERTINENT HISTORY: Pt is an 82 y/o male who reports he had a stroke on September 19th. From [facility]. Left sided CVA impacting the R side.   PRECAUTIONS: Fall  WEIGHT BEARING RESTRICTIONS: No  PAIN:  Are you having pain? No  FALLS: Has patient fallen in last 6 months? No  PLOF: Independent  PATIENT GOALS: To get stronger and resume independence in ADLs.   OBJECTIVE:  Note: Objective measures were completed at Evaluation unless otherwise noted.  HAND DOMINANCE: Right  ADLs: Overall ADLs: Pt reports difficulty  with gripping items, pt is unable to use a toothbrush or razor, is using LUE for ADLs. Pt is unable to tie his shoes, has not tried to hold a pencil. Using left hand to feed himself.  Pt enjoys golfing, fishing, and yardwork.    FUNCTIONAL OUTCOME MEASURES: FOTO: 78/100   UPPER EXTREMITY ROM:      A/ROM of BUE is [facility]  UPPER EXTREMITY MMT:     MMT Right eval  Shoulder flexion 5/5  Shoulder abduction 4/5 (same as LUE)  Shoulder internal rotation 5/5  Shoulder external rotation 5/5  Elbow flexion 5/5  Elbow extension 5/5  Wrist flexion 4/5  Wrist extension 3+/5  Wrist ulnar deviation 3+/5  Wrist radial deviation 3+/5  Wrist pronation 5/5  Wrist supination 5/5  (Blank rows = not tested)  HAND FUNCTION: Grip strength: Right: 22 lbs; Left: 110 lbs, Lateral pinch: Right: 4 lbs, Left: 26 lbs, and 3 point pinch: Right: unable lbs, Left: 17 lbs  COORDINATION: 9 Hole Peg test: Right: 1'48" with set-up sec; Left: 29.94 sec  SENSATION: WFL  EDEMA: mild edema in hand  COGNITION: Overall cognitive status: Within functional limits for tasks assessed  VISION: Subjective report: No change Baseline vision: No visual deficits Visual history: glaucoma   TODAY'S TREATMENT:                                                                                                                              DATE:   11/03/23 -A/ROM: flexion, abduction, protraction, horizontal abduction, er/IR, x10 -Wrist ROM: flexion, extension, ulnar/radial deviation, supination/protraction, x12 -Digit Rom: composite flexion, abduction, finger taps, opposition, x10 -Reviewed Edema massage and elevation for edema management -Sponges: 1, 1, 3 -Stacking 3, 4, and 6 cubes with pincers -Yellow putty: Roll into a ball, flatten into a pancake, roll into a log, tripod pinch, lateral pinch, roll into a ball, full squeeze x10     PATIENT EDUCATION: Education details: Wrist and Digit ROM Person educated:  Patient Education method: Programmer, multimedia, Demonstration, and Handouts Education comprehension: verbalized understanding and returned demonstration  HOME EXERCISE PROGRAM: Eval: yellow theraputty-grip and pinch strengthening 12/17: Wrist and digit ROM   GOALS: Goals reviewed with patient? Yes  SHORT TERM GOALS: Target date: 11/27/23  Pt will be provided with and educated on HEP to improve mobility and use  of RUE required for ADL completion.   Goal status: IN PROGRESS  2.  Pt will increase RUE strength to 4+/5 to improve ability to perform yardwork and fishing activities using RUE as dominant.   Goal status: IN PROGRESS  3.  Pt will increase right grip strength by 15+ pounds and pinch strength by 4+ pounds to improve ability to grip and utilize tools such as a toothbrush or razor with the right hand.   Goal status: IN PROGRESS  4.  Pt will improve RUE coordination required for tying shoes and operate fishing gear by completing 9 hole peg test in under 1'.   Goal status: IN PROGRESS    ASSESSMENT:  CLINICAL IMPRESSION: This session, pt working on overall UE mobility. He was able to demonstrate good ROM with his shoulder and elbow. His wrist also has good ROM, however it demonstrates increased tremors due to weakness and poor control. Due to edema and overall poor motor control of his hand, he requires min to mod assist with finger movements. OT providing hands on assist, as well as verbal and tactile cuing for positioning and technique throughout session.  PERFORMANCE DEFICITS: in functional skills including ADLs, IADLs, coordination, dexterity, edema, tone, ROM, strength, and UE functional use   PLAN:  OT FREQUENCY: 2x/week  OT DURATION: 4 weeks  PLANNED INTERVENTIONS: 97168 OT Re-evaluation, 97535 self care/ADL training, 23557 therapeutic exercise, 97530 therapeutic activity, 97112 neuromuscular re-education, 97140 manual therapy, 97018 paraffin, 32202 electrical stimulation  (manual), patient/family education, and DME and/or AE instructions  RECOMMENDED OTHER SERVICES: PT evaluation  CONSULTED AND AGREED WITH PLAN OF CARE: Patient  PLAN FOR NEXT SESSION: follow up on HEP, begin wrist and grip strengthening, coordination work   [employer], OTR/L [phone] 11/03/2023, 12:40 PM

## 2023-11-03 NOTE — Patient Instructions (Signed)
 AROM Exercises   1) Wrist Flexion  Start with wrist at edge of table, palm facing up. With wrist hanging slightly off table, curl wrist upward, and back down.      2) Wrist Extension  Start with wrist at edge of table, palm facing down. With wrist slightly off the edge of the table, curl wrist up and back down.      3) Radial Deviations  Start with forearm flat against a table, wrist hanging slightly off the edge, and palm facing the wall. Bending at the wrist only, and keeping palm facing the wall, bend wrist so fist is pointing towards the floor, back up to start position, and up towards the ceiling. Return to start.        4) WRIST PRONATION  Turn your forearm towards palm face down.  Keep your elbow bent and by the side of your  Body.      5) WRIST SUPINATION  Turn your forearm towards palm face up.  Keep your elbow bent and by the side of your  Body.      *Complete exercises 10-15 times each, 2-3 times per day*   Complete each exercise 10-15X, 2-3X/day  1) Towel crunch Place a small towel on a firm table top. Flatten out the towel and then place your hand on one end of it.  Next, flex your fingers 2-5 (index finger through pinky finger) as you pull the towel towards your hand.    2) Digit composite flexion/adduction (make a fist) Hold your hand up as shown. Open and close your hand into a fist and repeat. If you cannot make a full fist, then make a partial fist.    3) Thumb/finger opposition Touch the tip of the thumb to each fingertip one by one. Extend fingers fully after they are touched.      4) Finger Taps Start with the hand flat and fingers slightly spread.  One at a time, starting with the thumb, lift each finger up separately.   5) Digit Abduction/Adduction Hold hand palm down flat on table. Spread your fingers apart and back together.

## 2023-11-04 ENCOUNTER — Ambulatory Visit (HOSPITAL_COMMUNITY): Payer: Medicare HMO | Admitting: Speech Pathology

## 2023-11-04 ENCOUNTER — Encounter (HOSPITAL_COMMUNITY): Payer: Self-pay | Admitting: Speech Pathology

## 2023-11-04 DIAGNOSIS — R278 Other lack of coordination: Secondary | ICD-10-CM | POA: Diagnosis not present

## 2023-11-04 DIAGNOSIS — R29810 Facial weakness: Secondary | ICD-10-CM | POA: Diagnosis not present

## 2023-11-04 DIAGNOSIS — I69998 Other sequelae following unspecified cerebrovascular disease: Secondary | ICD-10-CM | POA: Diagnosis not present

## 2023-11-04 DIAGNOSIS — I69390 Apraxia following cerebral infarction: Secondary | ICD-10-CM | POA: Diagnosis not present

## 2023-11-04 DIAGNOSIS — R29818 Other symptoms and signs involving the nervous system: Secondary | ICD-10-CM | POA: Diagnosis not present

## 2023-11-04 DIAGNOSIS — R471 Dysarthria and anarthria: Secondary | ICD-10-CM

## 2023-11-04 DIAGNOSIS — R531 Weakness: Secondary | ICD-10-CM | POA: Diagnosis not present

## 2023-11-04 NOTE — Therapy (Signed)
OUTPATIENT SPEECH LANGUAGE PATHOLOGY EVALUATION   Patient Name: Joshua Compton. MRN: [id number] DOB:1940/11/30, 82 y.o., male Today's Date: 11/04/2023  PCP: Joshua Claude, MD REFERRING PROVIDER: [name], MD  END OF SESSION:  End of Session - 11/04/23 1144     Visit Number 1    Number of Visits 5    Date for SLP Re-Evaluation 12/10/23    Authorization Type [employer] Medicare   PER AVAILTY (AETNA) EFF 11/17/20 DED MET $4500 OOP ($3,473.00) $20.00 COPAY NO AUTH REQ   SLP Start Time 1100    SLP Stop Time  1145    SLP Time Calculation (min) 45 min    Activity Tolerance Patient tolerated treatment well             Past Medical History:  Diagnosis Date   Arthritis    Cancer (HCC)    prostate ca   seed implants   GERD (gastroesophageal reflux disease)    Glaucoma    Hyperlipidemia    Hypertension    dr Modesto Charon     [facility]   fm   Stroke [facility]) 07/2023   Past Surgical History:  Procedure Laterality Date   BACK SURGERY     x2   COLONOSCOPY     HERNIA REPAIR  11-06-2009   left inguinal repair    NECK SURGERY     ROTATOR CUFF REPAIR Left jan 2016   TOTAL HIP ARTHROPLASTY Right 04/20/2020   Procedure: RIGHT TOTAL HIP ARTHROPLASTY ANTERIOR APPROACH;  Surgeon: Tarry Kos, MD;  Location: [facility] OR;  Service: Orthopedics;  Laterality: Right;   TOTAL HIP ARTHROPLASTY Left 01/07/2021   Procedure: LEFT TOTAL HIP ARTHROPLASTY ANTERIOR APPROACH;  Surgeon: Tarry Kos, MD;  Location: [facility] OR;  Service: Orthopedics;  Laterality: Left;   TRANSURETHRAL RESECTION OF PROSTATE     Patient Active Problem List   Diagnosis Date Noted   High risk medication use 09/18/2023   Vitamin D deficiency 09/18/2023   Supplemental oxygen dependent 09/18/2023   Acute HFrEF (heart failure with reduced ejection fraction) (HCC) 09/08/2023   PAF (paroxysmal atrial fibrillation) (HCC) 09/07/2023   Acute respiratory failure with hypoxia (HCC) 09/05/2023   Acute heart failure with preserved ejection  fraction (HFpEF) (HCC) 09/05/2023   Lobar pneumonia (HCC) 09/05/2023   Cerebrovascular accident (CVA) due to embolism of left middle cerebral artery (HCC) 08/10/2023   Paresthesia of both feet 07/28/2023   Gastroesophageal reflux disease with esophagitis without hemorrhage 09/10/2021   Primary insomnia 09/10/2021   Vasculogenic erectile dysfunction 09/10/2021   Status post total replacement of left hip 01/07/2021   Primary osteoarthritis of left hip 01/06/2021   Leg cramps 12/11/2020   Thyromegaly 05/19/2017   Neoplasm of scalp 12/08/2013   Rhinitis 12/08/2013   Cough 12/08/2013   Hyperlipidemia    Cancer (HCC)    Arthritis    Severe stage glaucoma 03/26/2012   Nuclear sclerosis 03/26/2012   Primary open angle glaucoma 10/27/2011   Essential hypertension 02/04/2011   Degenerative disc disease 02/04/2011   Gastrointestinal bleed 02/04/2011   Anemia 02/04/2011   Prostate cancer (HCC) 02/04/2011    ONSET DATE: 08/07/2023  REFERRING DIAG: R29.810 (ICD-10-CM) - Weakness on right side of face I69.390 (ICD-10-CM) - 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 The patient was admitted to the hospital from 08/07/2023 to 08/14/2023. He was transferred to [facility] from [facility] for right-sided weakness and aphasia. The patient was found to have an acute left frontal lobe infarct that was felt to be cryptogenic. CTA of the head and neck was negative for LVO. Echocardiogram showed EF 50-55%, no WMA, normal diastolic function, normal RV function, trace MR/TR, mild AI, no AS. He was discharged to inpatient rehab where he stayed from 08/14/2023 to 08/24/2023. He  was discharged with aspirin 81 mg daily and Lipitor 40 mg daily for secondary prophylaxis. He has since returned home to live with his daughter and spouse.  PAIN:  Are you having pain? No  FALLS: Has patient fallen in last 6 months?  No  LIVING ENVIRONMENT: Lives with: lives with their family and lives with their spouse Lives in: House/apartment  PLOF:  Level of assistance: Independent with ADLs, Independent with IADLs Employment: Retired  PATIENT GOALS: Improve voice, speech, and his right hand  OBJECTIVE:  Note: Objective measures were completed at Evaluation unless otherwise noted.  DIAGNOSTIC FINDINGS:  MRI Head WO Contrast 08/07/23 1. Acute to subacute infarction within the posterior aspect of the left frontal lobe. Small amount of associated petechial hemorrhage.  COGNITION: Overall cognitive status: Impaired Areas of impairment:  Attention: Impaired: Sustained Functional deficits: Pt reports independence with medication, appointment, and financial management   AUDITORY COMPREHENSION: Overall auditory comprehension: Appears intact YES/NO questions: Appears intact Following directions: Appears intact Conversation: Complex Interfering components: attention, hearing, and processing speed Effective technique: extra processing time, repetition/stressing words, and increasing vocal volume to account for hearing impairment  READING COMPREHENSION: Intact  EXPRESSION: verbal  VERBAL EXPRESSION: Level of generative/spontaneous verbalization: conversation Automatic speech: name: intact, social response: intact, and month of year: intact  Repetition: Appears intact Naming:  WNL Pragmatics: Appears intact Comments: N/A Interfering components: speech intelligibility Effective technique:  N/A Non-verbal means of communication: N/A  WRITTEN EXPRESSION: Dominant hand: right Written expression: Not tested  MOTOR SPEECH: Overall motor speech: impaired Level of impairment:  Sentence Respiration:  WNL Phonation: normal Resonance: WFL Articulation: Impaired: sentence Intelligibility: Intelligibility reduced Motor planning: Appears intact Motor speech errors: aware Interfering components:  N/A Effective technique: slow rate, over articulate, and swallow more frequently  ORAL MOTOR EXAMINATION: Overall status: Impaired:   Labial: Right (Symmetry, Strength, and Sensation) Comments: Pt reports that he bites his right cheek and lip when eating, occasional loss of saliva  STANDARDIZED ASSESSMENTS: SLUMS: 24/30 (adjusted for less than high school education)  [facility] SLUMS Examination Orientation  3/3  Numeric Problem Solving  1/3  Memory  5/5  Attention 1/2  Thought Organization 3/3  Clock Drawing 2/4  Visuospatial Skills               2/2  Short Story Recall  6/8  Total  23/30     Scoring  High School Education  Less than High School Education   Normal  27-30 25-30  Mild Neurocognitive Disorder 21-26 20-24  Dementia  1-20 1-19    TODAY'S TREATMENT:  Evaluation completed this date. Plan for 4 treatment sessions to address dysarthria.  DATE: 11/04/23   PATIENT EDUCATION: Education details: dysarthria treatment strategies-over articulate, reduce rate, swallow 2x/minute, and avoid clearing throat Person educated: Patient Education method: Explanation, Demonstration, and Handouts Education comprehension: verbalized understanding   GOALS: Goals reviewed with patient? Yes  SHORT TERM GOALS: Target date: 12/10/2023  Pt will implement fluency enhancing and speech intelligibility strategies when generating sentences involving multisyllabic words with 90% acc and min assist. Baseline: mi/mod assist Goal status: INITIAL  2.  Pt will utilize speech intelligibility strategies (over articulation, reduced speaking rate, and  vocal intensity) at the conversation level with 90% acc and min cues. Baseline: ~75% intelligible Goal status: INITIAL  3.  Pt will Decrease chronic cough by 80% via reduction in laryngeal hypersensitivity. a. Sip liquid instead of coughing  b. Follow reflux recommendations (if applicable) c. Increase hydration to a minimum of 48+oz of water d. Improve breath support during speech (not speaking to end of breath) Baseline: Pt cleared throat frequently throughout the evaluation and reports taking reflux medication with breakfast Goal status: INITIAL  LONG TERM GOALS: Same as short term goals  ASSESSMENT:  CLINICAL IMPRESSION: Patient is an 82 y.o. male who was seen today for a cognitive linguistic evaluation s/p CVA 08/07/2023.  Pt lives with his spouse who has mild dementia. He was independent with managing finances and medications. Patient presents with a mild dysarthria c/b decreased speech intelligibility, reduced articulatory precision, and low volume speech. Patient was ~75% intelligible in conversation. Basic expressive and receptive language skills appear WFL. Confrontation naming, repetition, and automatic speech tasks were completed with 100% accuracy. Two step directives were followed independently. Basic yes/no questions were answered with 100% accuracy. Pt scored a 24/30 on the [facility] SLUMS with deficits in mental calculation, story recall, and attention. Pt is motivated to improve his speech intelligibility and resume previously enjoyed activities (golfing, fishing, working on his car, gardening, and Microbiologist). He has excellent family support.  OBJECTIVE IMPAIRMENTS: include dysarthria. These impairments are limiting patient from effectively communicating at home and in community. Factors affecting potential to achieve goals and functional outcome are  consideration of GERD impacting voice . Patient will benefit from skilled SLP services to address above impairments and improve  overall function.  REHAB POTENTIAL: Excellent  PLAN:  SLP FREQUENCY: 1x/week  SLP DURATION: 4 weeks  PLANNED INTERVENTIONS: [phone], Artic, Phon, Eval Compre, Express, [phone] Treatment of speech (30 or 45 min) , Cueing hierachy, Oral motor exercises, SLP instruction and feedback, Compensatory strategies, and Patient/family education   Thank you,  Havery Moros, CCC-SLP [phone]  Shakeita Vandevander, CCC-SLP 11/04/2023, 11:46 AM

## 2023-11-05 ENCOUNTER — Ambulatory Visit (HOSPITAL_COMMUNITY): Payer: Medicare HMO | Admitting: Occupational Therapy

## 2023-11-05 ENCOUNTER — Encounter (HOSPITAL_COMMUNITY): Payer: Self-pay | Admitting: Occupational Therapy

## 2023-11-05 DIAGNOSIS — R531 Weakness: Secondary | ICD-10-CM | POA: Diagnosis not present

## 2023-11-05 DIAGNOSIS — R471 Dysarthria and anarthria: Secondary | ICD-10-CM | POA: Diagnosis not present

## 2023-11-05 DIAGNOSIS — I69998 Other sequelae following unspecified cerebrovascular disease: Secondary | ICD-10-CM | POA: Diagnosis not present

## 2023-11-05 DIAGNOSIS — R29810 Facial weakness: Secondary | ICD-10-CM | POA: Diagnosis not present

## 2023-11-05 DIAGNOSIS — R278 Other lack of coordination: Secondary | ICD-10-CM | POA: Diagnosis not present

## 2023-11-05 DIAGNOSIS — R29818 Other symptoms and signs involving the nervous system: Secondary | ICD-10-CM

## 2023-11-05 DIAGNOSIS — I69390 Apraxia following cerebral infarction: Secondary | ICD-10-CM | POA: Diagnosis not present

## 2023-11-05 NOTE — Therapy (Signed)
OUTPATIENT OCCUPATIONAL THERAPY NEURO TREATMENT NOTE  Patient Name: Joshua Compton. MRN: [id number] DOB:Dec 20, 1940, 82 y.o., male Today's Date: 11/06/2023    END OF SESSION:  OT End of Session - 11/05/23 1605     Visit Number 3    Number of Visits 8    Date for OT Re-Evaluation 11/27/23    Authorization Type [employer], $20 copay    Authorization Time Period no auth required    Progress Note Due on Visit 10    OT Start Time 1524    OT Stop Time 1605    OT Time Calculation (min) 41 min    Activity Tolerance Patient tolerated treatment well    Behavior During Therapy WFL for tasks assessed/performed               Past Medical History:  Diagnosis Date   Arthritis    Cancer (HCC)    prostate ca   seed implants   GERD (gastroesophageal reflux disease)    Glaucoma    Hyperlipidemia    Hypertension    dr Modesto Charon     [place]   fm   Stroke [facility]) 07/2023   Past Surgical History:  Procedure Laterality Date   BACK SURGERY     x2   COLONOSCOPY     HERNIA REPAIR  11-06-2009   left inguinal repair    NECK SURGERY     ROTATOR CUFF REPAIR Left jan 2016   TOTAL HIP ARTHROPLASTY Right 04/20/2020   Procedure: RIGHT TOTAL HIP ARTHROPLASTY ANTERIOR APPROACH;  Surgeon: Tarry Kos, MD;  Location: [facility] OR;  Service: Orthopedics;  Laterality: Right;   TOTAL HIP ARTHROPLASTY Left 01/07/2021   Procedure: LEFT TOTAL HIP ARTHROPLASTY ANTERIOR APPROACH;  Surgeon: Tarry Kos, MD;  Location: [facility] OR;  Service: Orthopedics;  Laterality: Left;   TRANSURETHRAL RESECTION OF PROSTATE     Patient Active Problem List   Diagnosis Date Noted   High risk medication use 09/18/2023   Vitamin D deficiency 09/18/2023   Supplemental oxygen dependent 09/18/2023   Acute HFrEF (heart failure with reduced ejection fraction) (HCC) 09/08/2023   PAF (paroxysmal atrial fibrillation) (HCC) 09/07/2023   Acute respiratory failure with hypoxia (HCC) 09/05/2023   Acute heart failure with preserved  ejection fraction (HFpEF) (HCC) 09/05/2023   Lobar pneumonia (HCC) 09/05/2023   Cerebrovascular accident (CVA) due to embolism of left middle cerebral artery (HCC) 08/10/2023   Paresthesia of both feet 07/28/2023   Gastroesophageal reflux disease with esophagitis without hemorrhage 09/10/2021   Primary insomnia 09/10/2021   Vasculogenic erectile dysfunction 09/10/2021   Status post total replacement of left hip 01/07/2021   Primary osteoarthritis of left hip 01/06/2021   Leg cramps 12/11/2020   Thyromegaly 05/19/2017   Neoplasm of scalp 12/08/2013   Rhinitis 12/08/2013   Cough 12/08/2013   Hyperlipidemia    Cancer (HCC)    Arthritis    Severe stage glaucoma 03/26/2012   Nuclear sclerosis 03/26/2012   Primary open angle glaucoma 10/27/2011   Essential hypertension 02/04/2011   Degenerative disc disease 02/04/2011   Gastrointestinal bleed 02/04/2011   Anemia 02/04/2011   Prostate cancer (HCC) 02/04/2011   PCP: Dr. [name] REFERRING PROVIDER: Dr. [name]  ONSET DATE: 08/07/23  REFERRING DIAG: I69.998,R53.1 (ICD-10-CM) - Weakness as late effect of cerebrovascular accident (CVA)   THERAPY DIAG:  Other lack of coordination  Other symptoms and signs involving the nervous system  Rationale for Evaluation and Treatment: Rehabilitation  SUBJECTIVE:   SUBJECTIVE STATEMENT: S:  If my hand weren't swollen, I'd be good.  Pt accompanied by: self  PERTINENT HISTORY: Pt is an 82 y/o male who reports he had a stroke on September 19th. From [facility]. Left sided CVA impacting the R side.   PRECAUTIONS: Fall  WEIGHT BEARING RESTRICTIONS: No  PAIN:  Are you having pain? No  FALLS: Has patient fallen in last 6 months? No  PLOF: Independent  PATIENT GOALS: To get stronger and resume independence in ADLs.   OBJECTIVE:  Note: Objective measures were completed at Evaluation unless otherwise noted.  HAND DOMINANCE: Right  ADLs: Overall ADLs: Pt reports difficulty  with gripping items, pt is unable to use a toothbrush or razor, is using LUE for ADLs. Pt is unable to tie his shoes, has not tried to hold a pencil. Using left hand to feed himself.  Pt enjoys golfing, fishing, and yardwork.    FUNCTIONAL OUTCOME MEASURES: FOTO: 78/100   UPPER EXTREMITY ROM:      A/ROM of BUE is [facility]  UPPER EXTREMITY MMT:     MMT Right eval  Shoulder flexion 5/5  Shoulder abduction 4/5 (same as LUE)  Shoulder internal rotation 5/5  Shoulder external rotation 5/5  Elbow flexion 5/5  Elbow extension 5/5  Wrist flexion 4/5  Wrist extension 3+/5  Wrist ulnar deviation 3+/5  Wrist radial deviation 3+/5  Wrist pronation 5/5  Wrist supination 5/5  (Blank rows = not tested)  HAND FUNCTION: Grip strength: Right: 22 lbs; Left: 110 lbs, Lateral pinch: Right: 4 lbs, Left: 26 lbs, and 3 point pinch: Right: unable lbs, Left: 17 lbs  COORDINATION: 9 Hole Peg test: Right: 1'48" with set-up sec; Left: 29.94 sec  SENSATION: WFL  EDEMA: mild edema in hand  COGNITION: Overall cognitive status: Within functional limits for tasks assessed  VISION: Subjective report: No change Baseline vision: No visual deficits Visual history: glaucoma   TODAY'S TREATMENT:                                                                                                                              DATE:   11/05/23 -Wrist Strengthening: 2#, flexion, extension, ulnar/radial deviation, supination/protraction, x12 -Towel scrunches x5 -Towel roll squeezes, x10 -Towel Wringing out, x10 -Cards: holding in L hand and picking up 1 at a time with R hand to flip over, attempted picking up cards off table, however max difficulty with picking up unassisted -Digit Rom: composite flexion, abduction, finger taps, opposition, x10 -Large Peg Board and pattern  11/03/23 -A/ROM: flexion, abduction, protraction, horizontal abduction, er/IR, x10 -Wrist ROM: flexion, extension, ulnar/radial  deviation, supination/protraction, x12 -Digit Rom: composite flexion, abduction, finger taps, opposition, x10 -Reviewed Edema massage and elevation for edema management -Sponges: 1, 1, 3 -Stacking 3, 4, and 6 cubes with pincers -Yellow putty: Roll into a ball, flatten into a pancake, roll into a log, tripod pinch, lateral pinch, roll into a ball, full squeeze x10     PATIENT  EDUCATION: Education details: Wrist and Digit ROM Person educated: Patient Education method: Explanation, Demonstration, and Handouts Education comprehension: verbalized understanding and returned demonstration  HOME EXERCISE PROGRAM: Eval: yellow theraputty-grip and pinch strengthening 12/17: Wrist and digit ROM   GOALS: Goals reviewed with patient? Yes  SHORT TERM GOALS: Target date: 11/27/23  Pt will be provided with and educated on HEP to improve mobility and use of RUE required for ADL completion.   Goal status: IN PROGRESS  2.  Pt will increase RUE strength to 4+/5 to improve ability to perform yardwork and fishing activities using RUE as dominant.   Goal status: IN PROGRESS  3.  Pt will increase right grip strength by 15+ pounds and pinch strength by 4+ pounds to improve ability to grip and utilize tools such as a toothbrush or razor with the right hand.   Goal status: IN PROGRESS  4.  Pt will improve RUE coordination required for tying shoes and operate fishing gear by completing 9 hole peg test in under 1'.   Goal status: IN PROGRESS    ASSESSMENT:  CLINICAL IMPRESSION: This session, pt and OT focused on hand/finger movements. He is demonstrating improving wrist mobility and reports completing edema massage daily. Pt continues to have significant weakness and difficulty in his fingers, requiring mod assist with Digit ROM task. When working on the [employer], he has max difficulty with pinching hard enough to hold and place the pegs in the board. Hands on assist, as well as verbal and  tactile cuing provided for positioning and technique throughout session.   PERFORMANCE DEFICITS: in functional skills including ADLs, IADLs, coordination, dexterity, edema, tone, ROM, strength, and UE functional use   PLAN:  OT FREQUENCY: 2x/week  OT DURATION: 4 weeks  PLANNED INTERVENTIONS: 97168 OT Re-evaluation, 97535 self care/ADL training, 16010 therapeutic exercise, 97530 therapeutic activity, 97112 neuromuscular re-education, 97140 manual therapy, 97018 paraffin, 93235 electrical stimulation (manual), patient/family education, and DME and/or AE instructions  RECOMMENDED OTHER SERVICES: PT evaluation  CONSULTED AND AGREED WITH PLAN OF CARE: Patient  PLAN FOR NEXT SESSION: follow up on HEP, begin wrist and grip strengthening, coordination work   [employer], OTR/L [phone] 11/06/2023, 8:15 AM

## 2023-11-08 DIAGNOSIS — I5022 Chronic systolic (congestive) heart failure: Secondary | ICD-10-CM | POA: Diagnosis not present

## 2023-11-10 ENCOUNTER — Telehealth (HOSPITAL_COMMUNITY): Payer: Self-pay | Admitting: Speech Pathology

## 2023-11-10 ENCOUNTER — Other Ambulatory Visit: Payer: Self-pay | Admitting: Family Medicine

## 2023-11-10 ENCOUNTER — Encounter (HOSPITAL_COMMUNITY): Payer: Medicare HMO | Admitting: Occupational Therapy

## 2023-11-10 ENCOUNTER — Ambulatory Visit: Payer: Self-pay | Admitting: Family Medicine

## 2023-11-10 ENCOUNTER — Encounter (HOSPITAL_COMMUNITY): Payer: Medicare HMO | Admitting: Speech Pathology

## 2023-11-10 NOTE — Telephone Encounter (Signed)
Copied from CRM [id number]. Topic: Clinical - Medical Advice >> Nov 10, 2023  9:07 AM Maxwell Marion wrote: Reason for CRM: Pt is out of one of his medications and it is a blood thinner, daughter wants to know if patient will be okay for a few days without it until he can get a refill. She would like a call back, number is [phone].    Chief Complaint: Medication refill, out of medication Symptoms: No symptom Frequency: n/a Pertinent Negatives: Patient denies new symptoms Disposition: [] ED /[] Urgent Care (no appt availability in office) / [] Appointment(In office/virtual)/ []  [facility]/ [] Home Care/ [] Refused Recommended Disposition /[] [facility]/ [x]  Follow-up with PCP Additional Notes: Per daughter Dorene Grebe, patient is now out of Eliquis and Entresto. She states that she sent a refill request to the pharmacy, but it has not been filled yet because there are no more refill orders. Dorene Grebe sts that she called [facility] for assistance, but has been unable to speak with anyone. The original prescription was ordered by hospital doctor, Dr. Onalee Hua Tat on 10/22 with 1 refill ordered. No new orders placed. This RN called pharmacy on file to confirm delay reason. No answer, but RN did leave vmail.    Reason for Disposition  [1] Prescription refill request for ESSENTIAL medicine (i.e., likelihood of harm to patient if not taken) AND [2] triager unable to refill per department policy  Answer Assessment - Initial Assessment Questions 1. DRUG NAME: "What medicine do you need to have refilled?"     Eliquis 5mg  and Entresto 24-26mg   2. REFILLS REMAINING: "How many refills are remaining?" (Note: The label on the medicine or pill bottle will show how many refills are remaining. If there are no refills remaining, then a renewal may be needed.)     No refills remain  3. EXPIRATION DATE: "What is the expiration date?" (Note: The label states when the prescription will expire, and thus can no  longer be refilled.)     N/a  4. PRESCRIBING HCP: "Who prescribed it?" Reason: If prescribed by specialist, call should be referred to that group.     Dr. Onalee Hua Tat  5. SYMPTOMS: "Do you have any symptoms?"     No symptoms  Protocols used: Medication Refill and Renewal Call-A-AH

## 2023-11-10 NOTE — Telephone Encounter (Signed)
Telephone Call:  Pt did not show for his SLP and OT appointments. SLP called and spoke with his daughter and she apologized, stating they must have forgotten the appointments. They were reminded of their OT appointment this Friday.  Thank you,  Havery Moros, CCC-SLP [phone]

## 2023-11-10 NOTE — Telephone Encounter (Signed)
Copied from CRM [id number]. Topic: Clinical - Medication Refill >> Nov 10, 2023  9:08 AM Maxwell Marion wrote: Most Recent Primary Care Visit:  Provider: [name]  Department: [facility]  Visit Type: OFFICE VISIT  Date: 10/19/2023  Medication: apixaban (ELIQUIS) 5 MG TABS sacubitril-valsartan (ENTRESTO) 24-26 MG  Has the patient contacted their pharmacy? Yes, pharmacy said their provider had to approve refill request (Agent: If no, request that the patient contact the pharmacy for the refill. If patient does not wish to contact the pharmacy document the reason why and proceed with request.) (Agent: If yes, when and what did the pharmacy advise?)  Is this the correct pharmacy for this prescription? Yes If no, delete pharmacy and type the correct one.  This is the patient's preferred pharmacy:  [id number] - [place], [place] - [address] [place] [address] Phone: [phone] Fax: [phone]   Has the prescription been filled recently?   Is the patient out of the medication?   Has the patient been seen for an appointment in the last year OR does the patient have an upcoming appointment?   Can we respond through MyChart?   Agent: Please be advised that Rx refills may take up to 3 business days. We ask that you follow-up with your pharmacy.

## 2023-11-12 ENCOUNTER — Ambulatory Visit: Payer: Medicare HMO | Attending: Nurse Practitioner | Admitting: Nurse Practitioner

## 2023-11-12 VITALS — BP 128/64 | HR 62 | Ht 72.0 in | Wt 210.2 lb

## 2023-11-12 DIAGNOSIS — N179 Acute kidney failure, unspecified: Secondary | ICD-10-CM | POA: Diagnosis not present

## 2023-11-12 DIAGNOSIS — I4891 Unspecified atrial fibrillation: Secondary | ICD-10-CM | POA: Diagnosis not present

## 2023-11-12 DIAGNOSIS — I5022 Chronic systolic (congestive) heart failure: Secondary | ICD-10-CM | POA: Diagnosis not present

## 2023-11-12 DIAGNOSIS — Z8673 Personal history of transient ischemic attack (TIA), and cerebral infarction without residual deficits: Secondary | ICD-10-CM

## 2023-11-12 DIAGNOSIS — J449 Chronic obstructive pulmonary disease, unspecified: Secondary | ICD-10-CM

## 2023-11-12 DIAGNOSIS — I10 Essential (primary) hypertension: Secondary | ICD-10-CM

## 2023-11-12 NOTE — Progress Notes (Signed)
Cardiology Office Note:  .   Date:  11/12/2023 ID:  Joshua Najjar., DOB Dec 12, 1940, MRN [id number] PCP: [name], MD  [facility] Providers Cardiologist:  Charlton Haws, MD    History of Present Illness: .   Joshua Kubacki. is a 82 y.o. male with a PMH of HFmrEF, new onset A-fib, hypertension, hyperlipidemia, prediabetes, history of CVA with right hemiparesis, and history of prostate cancer, GERD, COPD, CKD stage IIIa, who presents today for 3 week follow-up.   He had previously been admitted in September 2024 and was transferred to [facility] from [facility] for right sided weakness and aphasia, was found to have acute left frontal lobe infarct and felt to be cryptogenic.  CTA of head and neck was negative for LVO.  Echocardiogram revealed EF 50 to 55%, mild AI, no WMA, trace MR/TR, no AAS.  Was discharged to inpatient rehab.  Hospitalized October 2024 for shortness of breath.  Patient noted increasing lower extremity edema and orthopnea, reported sleeping an incline or for the past 2 weeks prior to presenting to hospital.  CXR revealed increased interstitial markings, right lower lobe infiltrate.  Was treated with IV Lasix and antibiotics.  Treated for pneumonia and pulmonary edema.  Limited echo during hospitalization revealed EF 40 to 45%, mild to moderate AI, global hypokinesis.  Started Sherryll Burger, Marcelline Deist.  Hospital course also noted by new onset A-fib.  Cardiology was consulted and was started on amiodarone 200 mg twice daily x 3 weeks, then once daily, started on Eliquis.  Seen by [facility] on 09/23/2023. HR 45 bpm on exam, Metoprolol dosage was reduced to 25 mg daily.   I last saw him for 3-week follow-up with his daughter on October 01, 2023.  At the time, he was wearing 2 L of continuous oxygen, was doing well after his stroke.  Did have some slurred speech and right sided weakness, was wearing a compression device of her right hand to help improve swelling.  Denied any chest pain, shortness of breath, palpitations, syncope, presyncope, dizziness, orthopnea, PND, swelling or significant weight changes, acute bleeding, or claudication.   Today he presents for follow-up.  He is no longer using continuous oxygen, PCP instructed him to wear this at night.  Daughter states patient has not been doing this, but patient states his SpO2 readings on pulse oximeter are WNL.  He says his swelling along right upper extremity is slightly improved, but he still notices this.  He does admit to stable DOE since his stroke, denies any chest pain.  Speech has improved since last office visit. Denies any chest pain, palpitations, syncope, presyncope, dizziness, orthopnea, PND, swelling or significant weight changes, acute bleeding, or claudication.  ROS: Negative. See HPI.   Studies Reviewed: Marland Kitchen    EKG: EKG is not ordered today.     ABI's 09/2023: Summary:  Right: Resting right ankle-brachial index is within normal range. The  right toe-brachial index is normal.   Left: Resting left ankle-brachial index is within normal range. The left  toe-brachial index is normal.  Limited 08/2023:   1. Left ventricular ejection fraction, by estimation, is 40 to 45%. The  left ventricle has mildly decreased function. The left ventricle  demonstrates global hypokinesis. The left ventricular internal cavity size  was moderately dilated.   2. The mitral valve is grossly normal. No evidence of mitral valve  regurgitation.   3. The aortic valve is calcified. Aortic valve regurgitation is mild to  moderate. Aortic valve sclerosis  is present, with no evidence of aortic  valve stenosis.  Venous US of right upper extremity 08/2023: No evidence of DVT. Risk Assessment/Calculations:    CHA2DS2-VASc Score = 7   This indicates a 11.2% annual risk of stroke. The patient's score is based upon: CHF History: 1 HTN History: 1 Diabetes History: 0 Stroke History: 2 Vascular Disease  History: 1 Age Score: 2 Gender Score: 0  Physical Exam:   VS:  BP 128/64 (BP Location: Left Arm)   Pulse 62   Ht 6' (1.829 m)   Wt 210 lb 3.2 oz (95.3 kg)   SpO2 95%   BMI 28.51 kg/m    Wt Readings from Last 3 Encounters:  11/12/23 210 lb 3.2 oz (95.3 kg)  10/19/23 200 lb 3.2 oz (90.8 kg)  10/01/23 202 lb (91.6 kg)    GEN: Well nourished, well developed in no acute distress NECK: No JVD; No carotid bruits CARDIAC: S1/S2, RRR, no murmurs, rubs, gallops RESPIRATORY:  Clear and diminished to auscultation without rales, wheezing or rhonchi  EXTREMITIES:  Nonpitting edema to right upper extremity, 2+ peripheral pulses throughout with WNL capillary refill along extremities; No deformity, right sided weakness noted   ASSESSMENT AND PLAN: .    HFmrEF Stage C, NYHA class II-III symptoms. EF 08/2023 40-45%. Felt to be tachycardia mediated. Euvolemic and well compensated on exam. GDMT limited d/t patient's blood pressure/HR trends. Continue Farxiga, Lasix PRN, Entresto, and Toprol XL. Low sodium diet, fluid restriction <2L, and daily weights encouraged. Educated to contact our office for weight gain of 2 lbs overnight or 5 lbs in one week.  Plan to update echocardiogram at next office visit.  A-fib Denies any tachycardia or palpitations.  Heart rate is better controlled today. A-fib possibly triggered by PNA from previous hospitalization. Continue Amiodarone and Toprol XL. Continue Eliquis for stroke prevention.  Last kidney function shows serum creatinine at 1.52 with eGFR 45.  Recommend that PCP obtain BMET and 2 to 3 weeks to evaluate his kidney function.  HTN BP stable. Discussed to monitor BP at home at least 2 hours after medications and sitting for 5-10 minutes.  Current medication regimen.  Heart healthy diet encouraged.   CVA Has made significant progress from his stroke, still has right side weakness and speech has improved.  Continue to work with therapy.  Continue to follow with  PCP and Neurology.  COPD Breathing is stable.  Continue current medication regimen.  Care and ED precautions discussed. Continue to follow with PCP.  Previously placed pulmonology referral for further evaluation.  6. AKI Most recent sCr was 1.52 with eGFR 45, due to Lasix. PCP is managing this. Agree with PCP to use Lasix PRN and this was discussed with pt and daughter, who both verbalized understanding. Recommend that PCP obtain BMET and 2 to 3 weeks to evaluate his kidney function.   Dispo: Follow-up with me/APP in 2 to 3 months or sooner if anything changes.   Signed, Sharlene Dory, NP

## 2023-11-12 NOTE — Telephone Encounter (Signed)
Refill was sent. Pt daughter aware

## 2023-11-12 NOTE — Patient Instructions (Addendum)

## 2023-11-13 ENCOUNTER — Encounter (HOSPITAL_COMMUNITY): Payer: Self-pay | Admitting: Occupational Therapy

## 2023-11-13 ENCOUNTER — Ambulatory Visit (HOSPITAL_COMMUNITY): Payer: Medicare HMO | Admitting: Occupational Therapy

## 2023-11-13 DIAGNOSIS — R29810 Facial weakness: Secondary | ICD-10-CM | POA: Diagnosis not present

## 2023-11-13 DIAGNOSIS — I69390 Apraxia following cerebral infarction: Secondary | ICD-10-CM | POA: Diagnosis not present

## 2023-11-13 DIAGNOSIS — R278 Other lack of coordination: Secondary | ICD-10-CM

## 2023-11-13 DIAGNOSIS — R471 Dysarthria and anarthria: Secondary | ICD-10-CM | POA: Diagnosis not present

## 2023-11-13 DIAGNOSIS — I69998 Other sequelae following unspecified cerebrovascular disease: Secondary | ICD-10-CM | POA: Diagnosis not present

## 2023-11-13 DIAGNOSIS — R531 Weakness: Secondary | ICD-10-CM | POA: Diagnosis not present

## 2023-11-13 DIAGNOSIS — R29818 Other symptoms and signs involving the nervous system: Secondary | ICD-10-CM

## 2023-11-13 NOTE — Therapy (Signed)
OUTPATIENT OCCUPATIONAL THERAPY NEURO TREATMENT NOTE  Patient Name: Joshua Compton. MRN: [id number] DOB:12-Feb-1941, 82 y.o., male Today's Date: 11/13/2023    END OF SESSION:  OT End of Session - 11/13/23 1231     Visit Number 4    Number of Visits 8    Date for OT Re-Evaluation 11/27/23    Authorization Type [employer], $20 copay    Authorization Time Period no auth required    Progress Note Due on Visit 10    OT Start Time 1153    OT Stop Time 1230    OT Time Calculation (min) 37 min    Activity Tolerance Patient tolerated treatment well                Past Medical History:  Diagnosis Date   Arthritis    Cancer (HCC)    prostate ca   seed implants   GERD (gastroesophageal reflux disease)    Glaucoma    Hyperlipidemia    Hypertension    dr Modesto Charon     [facility]   fm   Stroke [facility]) 07/2023   Past Surgical History:  Procedure Laterality Date   BACK SURGERY     x2   COLONOSCOPY     HERNIA REPAIR  11-06-2009   left inguinal repair    NECK SURGERY     ROTATOR CUFF REPAIR Left jan 2016   TOTAL HIP ARTHROPLASTY Right 04/20/2020   Procedure: RIGHT TOTAL HIP ARTHROPLASTY ANTERIOR APPROACH;  Surgeon: Tarry Kos, MD;  Location: [facility] OR;  Service: Orthopedics;  Laterality: Right;   TOTAL HIP ARTHROPLASTY Left 01/07/2021   Procedure: LEFT TOTAL HIP ARTHROPLASTY ANTERIOR APPROACH;  Surgeon: Tarry Kos, MD;  Location: [facility] OR;  Service: Orthopedics;  Laterality: Left;   TRANSURETHRAL RESECTION OF PROSTATE     Patient Active Problem List   Diagnosis Date Noted   High risk medication use 09/18/2023   Vitamin D deficiency 09/18/2023   Supplemental oxygen dependent 09/18/2023   Acute HFrEF (heart failure with reduced ejection fraction) (HCC) 09/08/2023   PAF (paroxysmal atrial fibrillation) (HCC) 09/07/2023   Acute respiratory failure with hypoxia (HCC) 09/05/2023   Acute heart failure with preserved ejection fraction (HFpEF) (HCC) 09/05/2023   Lobar pneumonia  (HCC) 09/05/2023   Cerebrovascular accident (CVA) due to embolism of left middle cerebral artery (HCC) 08/10/2023   Paresthesia of both feet 07/28/2023   Gastroesophageal reflux disease with esophagitis without hemorrhage 09/10/2021   Primary insomnia 09/10/2021   Vasculogenic erectile dysfunction 09/10/2021   Status post total replacement of left hip 01/07/2021   Primary osteoarthritis of left hip 01/06/2021   Leg cramps 12/11/2020   Thyromegaly 05/19/2017   Neoplasm of scalp 12/08/2013   Rhinitis 12/08/2013   Cough 12/08/2013   Hyperlipidemia    Cancer (HCC)    Arthritis    Severe stage glaucoma 03/26/2012   Nuclear sclerosis 03/26/2012   Primary open angle glaucoma 10/27/2011   Essential hypertension 02/04/2011   Degenerative disc disease 02/04/2011   Gastrointestinal bleed 02/04/2011   Anemia 02/04/2011   Prostate cancer (HCC) 02/04/2011   PCP: Dr. [name] REFERRING PROVIDER: Dr. [name]  ONSET DATE: 08/07/23  REFERRING DIAG: I69.998,R53.1 (ICD-10-CM) - Weakness as late effect of cerebrovascular accident (CVA)   THERAPY DIAG:  Other lack of coordination  Other symptoms and signs involving the nervous system  Rationale for Evaluation and Treatment: Rehabilitation  SUBJECTIVE:   SUBJECTIVE STATEMENT: S: I have been doing my exercises and working this  hand  Pt accompanied by: self  PERTINENT HISTORY: Pt is an 82 y/o male who reports he had a stroke on September 19th. From [facility]. Left sided CVA impacting the R side.   PRECAUTIONS: Fall  WEIGHT BEARING RESTRICTIONS: No  PAIN:  Are you having pain? No  FALLS: Has patient fallen in last 6 months? No  PLOF: Independent  PATIENT GOALS: To get stronger and resume independence in ADLs.   OBJECTIVE:  Note: Objective measures were completed at Evaluation unless otherwise noted.  HAND DOMINANCE: Right  ADLs: Overall ADLs: Pt reports difficulty with gripping items, pt is unable to use a  toothbrush or razor, is using LUE for ADLs. Pt is unable to tie his shoes, has not tried to hold a pencil. Using left hand to feed himself.  Pt enjoys golfing, fishing, and yardwork.    FUNCTIONAL OUTCOME MEASURES: FOTO: 78/100   UPPER EXTREMITY ROM:      A/ROM of BUE is [facility]  UPPER EXTREMITY MMT:     MMT Right eval  Shoulder flexion 5/5  Shoulder abduction 4/5 (same as LUE)  Shoulder internal rotation 5/5  Shoulder external rotation 5/5  Elbow flexion 5/5  Elbow extension 5/5  Wrist flexion 4/5  Wrist extension 3+/5  Wrist ulnar deviation 3+/5  Wrist radial deviation 3+/5  Wrist pronation 5/5  Wrist supination 5/5  (Blank rows = not tested)  HAND FUNCTION: Grip strength: Right: 22 lbs; Left: 110 lbs, Lateral pinch: Right: 4 lbs, Left: 26 lbs, and 3 point pinch: Right: unable lbs, Left: 17 lbs  COORDINATION: 9 Hole Peg test: Right: 1'48" with set-up sec; Left: 29.94 sec  SENSATION: WFL  EDEMA: mild edema in hand  COGNITION: Overall cognitive status: Within functional limits for tasks assessed  VISION: Subjective report: No change Baseline vision: No visual deficits Visual history: glaucoma   TODAY'S TREATMENT:                                                                                                                              DATE:   11/13/23 -Wrist Strengthening: 3#, flexion, extension, ulnar/radial deviation, supination/protraction, x12 - Digit ROM: composite fist, finger opposition to thumb (unable to do pinky at this time) 5x each  - Pinch strength: yellow and green clips with thumb and index finger  -Yellow putty: Roll into a ball, flatten into a pancake, roll into a log, tripod pinch, lateral pinch, roll into a ball, full squeeze x10 - Pincer grasp: picking up large pegs and using palm to pres into board 20x    11/05/23 -Wrist Strengthening: 2#, flexion, extension, ulnar/radial deviation, supination/protraction, x12 -Towel scrunches x5 -Towel  roll squeezes, x10 -Towel Wringing out, x10 -Cards: holding in L hand and picking up 1 at a time with R hand to flip over, attempted picking up cards off table, however max difficulty with picking up unassisted -Digit Rom: composite flexion, abduction, finger taps, opposition, x10 -Large Peg Board and  pattern  11/03/23 -A/ROM: flexion, abduction, protraction, horizontal abduction, er/IR, x10 -Wrist ROM: flexion, extension, ulnar/radial deviation, supination/protraction, x12 -Digit Rom: composite flexion, abduction, finger taps, opposition, x10 -Reviewed Edema massage and elevation for edema management -Sponges: 1, 1, 3 -Stacking 3, 4, and 6 cubes with pincers -Yellow putty: Roll into a ball, flatten into a pancake, roll into a log, tripod pinch, lateral pinch, roll into a ball, full squeeze x10     PATIENT EDUCATION: Education details: Wrist and Digit ROM Person educated: Patient Education method: Programmer, multimedia, Demonstration, and Handouts Education comprehension: verbalized understanding and returned demonstration  HOME EXERCISE PROGRAM: Eval: yellow theraputty-grip and pinch strengthening 12/17: Wrist and digit ROM 12/27: pinch activities    GOALS: Goals reviewed with patient? Yes  SHORT TERM GOALS: Target date: 11/27/23  Pt will be provided with and educated on HEP to improve mobility and use of RUE required for ADL completion.   Goal status: IN PROGRESS  2.  Pt will increase RUE strength to 4+/5 to improve ability to perform yardwork and fishing activities using RUE as dominant.   Goal status: IN PROGRESS  3.  Pt will increase right grip strength by 15+ pounds and pinch strength by 4+ pounds to improve ability to grip and utilize tools such as a toothbrush or razor with the right hand.   Goal status: IN PROGRESS  4.  Pt will improve RUE coordination required for tying shoes and operate fishing gear by completing 9 hole peg test in under 1'.   Goal status: IN  PROGRESS    ASSESSMENT:  CLINICAL IMPRESSION: Pt reports that he has been completing his exercises at home. Gave pt new edema glove in a size medium due to his older glove not fitting well. Added in pinch exercises with yellow and green clips on tree- lateral pinch with thumb and index finger. Continued with large pegs, able to use pincer grasp to pick up but has to use palm of hand to push pegs into board. Discussed pinching activities for home with clothespin.   PERFORMANCE DEFICITS: in functional skills including ADLs, IADLs, coordination, dexterity, edema, tone, ROM, strength, and UE functional use   PLAN:  OT FREQUENCY: 2x/week  OT DURATION: 4 weeks  PLANNED INTERVENTIONS: 97168 OT Re-evaluation, 97535 self care/ADL training, 16109 therapeutic exercise, 97530 therapeutic activity, 97112 neuromuscular re-education, 97140 manual therapy, 97018 paraffin, 60454 electrical stimulation (manual), patient/family education, and DME and/or AE instructions  RECOMMENDED OTHER SERVICES: PT evaluation  CONSULTED AND AGREED WITH PLAN OF CARE: Patient  PLAN FOR NEXT SESSION: follow up on HEP, begin wrist and grip strengthening, coordination work   Lurena Joiner, OTR/L [phone] 11/13/2023, 12:32 PM

## 2023-11-15 ENCOUNTER — Emergency Department (HOSPITAL_COMMUNITY): Payer: Medicare HMO

## 2023-11-15 ENCOUNTER — Emergency Department (HOSPITAL_COMMUNITY)
Admission: EM | Admit: 2023-11-15 | Discharge: 2023-11-15 | Disposition: A | Discharge status: Home / Self Care | Payer: Medicare HMO | Attending: Emergency Medicine | Admitting: Emergency Medicine

## 2023-11-15 ENCOUNTER — Other Ambulatory Visit: Payer: Self-pay

## 2023-11-15 DIAGNOSIS — I10 Essential (primary) hypertension: Secondary | ICD-10-CM | POA: Diagnosis not present

## 2023-11-15 DIAGNOSIS — R600 Localized edema: Secondary | ICD-10-CM | POA: Diagnosis not present

## 2023-11-15 DIAGNOSIS — M7989 Other specified soft tissue disorders: Secondary | ICD-10-CM | POA: Diagnosis not present

## 2023-11-15 DIAGNOSIS — N289 Disorder of kidney and ureter, unspecified: Secondary | ICD-10-CM | POA: Insufficient documentation

## 2023-11-15 DIAGNOSIS — Z8546 Personal history of malignant neoplasm of prostate: Secondary | ICD-10-CM | POA: Insufficient documentation

## 2023-11-15 DIAGNOSIS — Z8673 Personal history of transient ischemic attack (TIA), and cerebral infarction without residual deficits: Secondary | ICD-10-CM | POA: Insufficient documentation

## 2023-11-15 LAB — CBC WITH DIFFERENTIAL/PLATELET
Abs Immature Granulocytes: 0.03 10*3/uL (ref 0.00–0.07)
Basophils Absolute: 0.1 10*3/uL (ref 0.0–0.1)
Basophils Relative: 2 %
Eosinophils Absolute: 0.2 10*3/uL (ref 0.0–0.5)
Eosinophils Relative: 3 %
HCT: 42.5 % (ref 39.0–52.0)
Hemoglobin: 13.5 g/dL (ref 13.0–17.0)
Immature Granulocytes: 1 %
Lymphocytes Relative: 33 %
Lymphs Abs: 1.8 10*3/uL (ref 0.7–4.0)
MCH: 31 pg (ref 26.0–34.0)
MCHC: 31.8 g/dL (ref 30.0–36.0)
MCV: 97.5 fL (ref 80.0–100.0)
Monocytes Absolute: 0.4 10*3/uL (ref 0.1–1.0)
Monocytes Relative: 8 %
Neutro Abs: 2.9 10*3/uL (ref 1.7–7.7)
Neutrophils Relative %: 53 %
Platelets: 219 10*3/uL (ref 150–400)
RBC: 4.36 MIL/uL (ref 4.22–5.81)
RDW: 15.2 % (ref 11.5–15.5)
WBC: 5.4 10*3/uL (ref 4.0–10.5)
nRBC: 0 % (ref 0.0–0.2)

## 2023-11-15 LAB — BASIC METABOLIC PANEL
Anion gap: 11 (ref 5–15)
BUN: 17 mg/dL (ref 8–23)
CO2: 25 mmol/L (ref 22–32)
Calcium: 9 mg/dL (ref 8.9–10.3)
Chloride: 104 mmol/L (ref 98–111)
Creatinine, Ser: 1.39 mg/dL — ABNORMAL HIGH (ref 0.61–1.24)
GFR, Estimated: 51 mL/min — ABNORMAL LOW (ref >60)
Glucose, Bld: 90 mg/dL (ref 70–99)
Potassium: 3.5 mmol/L (ref 3.5–5.1)
Sodium: 140 mmol/L (ref 135–145)

## 2023-11-15 LAB — BRAIN NATRIURETIC PEPTIDE: B Natriuretic Peptide: 253.3 pg/mL — ABNORMAL HIGH (ref 0.0–100.0)

## 2023-11-15 NOTE — ED Provider Triage Note (Cosign Needed)
Emergency Medicine Provider Triage Evaluation Note  Joshua Compton. , a 82 y.o. male  was evaluated in triage.  Pt complains of bilateral leg swelling that has been going on since October 2019 since being dx'd with heart failure. Swelling is more prominent today. Uses lasix as needed. No SOB.   Review of Systems  Positive: Leg swelling Negative: SOB  Physical Exam  BP (!) 169/61 (BP Location: Right Arm)   Pulse (!) 51   Temp 98.2 F (36.8 C)   Resp 18   SpO2 94%  Gen:   Awake, no distress   Resp:  Normal effort MSK:   Moves extremities without difficulty  Other:  1+ pitting edema, no crackles of lungs  Medical Decision Making  Medically screening exam initiated at 11:29 AM.  Appropriate orders placed.  Joshua Najjar. was informed that the remainder of the evaluation will be completed by another provider, this initial triage assessment does not replace that evaluation, and the importance of remaining in the ED until their evaluation is complete.     Pete Pelt, [place] 11/15/23 1132

## 2023-11-15 NOTE — Discharge Instructions (Signed)
You were seen in the emergency room today with swelling in your legs.  We discussed continuing to use your compression stockings and elevating your legs when resting at home.  I have checked your lab work and do think it would be safe to take 1 dose of Lasix today and a second dose of Lasix tomorrow.  I would not take additional Lasix without speaking with your primary care doctor or cardiology doctor.  If you develop any shortness of breath you should return to the emergency department.

## 2023-11-15 NOTE — ED Triage Notes (Signed)
Pt. Stated, I have swelling in both feet and legs , I noticed it this morning

## 2023-11-15 NOTE — ED Provider Notes (Incomplete)
Emergency Department Provider Note   I have reviewed the triage vital signs and the nursing notes.   HISTORY  Chief Complaint Leg Swelling and swelling in feet   HPI Joshua Compton. is a 82 y.o. male ***   {**SYMPTOM/COMPLAINT  LOCATION (describe anatomically) DURATION (when did it start) TIMING (onset and pattern) SEVERITY (0-10, mild/moderate/severe) QUALITY (description of symptoms) CONTEXT (recent surgery, new meds, activity, etc.) MODIFYINGFACTORS (what makes it better/worse) ASSOCIATEDSYMPTOMS (pertinent positives and negatives)**}  Past Medical History:  Diagnosis Date   Arthritis    Cancer (HCC)    prostate ca   seed implants   GERD (gastroesophageal reflux disease)    Glaucoma    Hyperlipidemia    Hypertension    dr [place] Charon     [facility]   fm   Stroke [facility]) 07/2023    Review of Systems {** Revise as appropriate then delete this line - Documentation of 10 systems OR 2 systems and "10-point ROS otherwise negative" is required **}Constitutional: No fever/chills Eyes: No visual changes. ENT: No sore throat. Cardiovascular: Denies chest pain. Respiratory: Denies shortness of breath. Gastrointestinal: No abdominal pain.  No nausea, no vomiting.  No diarrhea.  No constipation. Genitourinary: Negative for dysuria. Musculoskeletal: Negative for back pain. Skin: Negative for rash. Neurological: Negative for headaches, focal weakness or numbness. {**Psychiatric:  Endocrine:  Hematological/Lymphatic:  Allergic/Immunilogical: **}  ____________________________________________   PHYSICAL EXAM:  VITAL SIGNS: ED Triage Vitals  Encounter Vitals Group     BP 11/15/23 1024 (!) 169/61     Systolic BP Percentile --      Diastolic BP Percentile --      Pulse Rate 11/15/23 1024 (!) 51     Resp 11/15/23 1024 18     Temp 11/15/23 1024 98.2 F (36.8 C)     Temp src --      SpO2 11/15/23 1024 94 %     Weight --      Height --      Head Circumference --       Peak Flow --      Pain Score 11/15/23 1111 0     Pain Loc --      Pain Education --      Exclude from Growth Chart --    {** Revise as appropriate then delete this line - 8 systems required **} Constitutional: Alert and oriented. Well appearing and in no acute distress. Eyes: Conjunctivae are normal. PERRL. EOMI. Head: Atraumatic. {**Ears:  Healthy appearing ear canals and TMs bilaterally **}Nose: No congestion/rhinnorhea. Mouth/Throat: Mucous membranes are moist.  Oropharynx non-erythematous. Neck: No stridor.  No meningeal signs.  {**No cervical spine tenderness to palpation.**} Cardiovascular: Normal rate, regular rhythm. Good peripheral circulation. Grossly normal heart sounds.   Respiratory: Normal respiratory effort.  No retractions. Lungs CTAB. Gastrointestinal: Soft and nontender. No distention.  {**Genitourinary:  **}Musculoskeletal: No lower extremity tenderness nor edema. No gross deformities of extremities. Neurologic:  Normal speech and language. No gross focal neurologic deficits are appreciated.  Skin:  Skin is warm, dry and intact. No rash noted. {**Psychiatric: Mood and affect are normal. Speech and behavior are normal.**}  ____________________________________________   LABS (all labs ordered are listed, but only abnormal results are displayed)  Labs Reviewed  BASIC METABOLIC PANEL - Abnormal; Notable for the following components:      Result Value   Creatinine, Ser 1.39 (*)    GFR, Estimated 51 (*)    All other components within normal limits  CBC WITH  DIFFERENTIAL/PLATELET  CBC WITH DIFFERENTIAL/PLATELET  BRAIN NATRIURETIC PEPTIDE   ____________________________________________  EKG  *** ____________________________________________  RADIOLOGY  DG Chest 2 View Result Date: 11/15/2023 CLINICAL DATA:  Leg swelling EXAM: CHEST - 2 VIEW COMPARISON:  09/05/2023 FINDINGS: The heart size and mediastinal contours are stable. No focal airspace  consolidation, pleural effusion, or pneumothorax. The visualized skeletal structures are unremarkable. IMPRESSION: No active cardiopulmonary disease. Electronically Signed   By: Duanne Guess D.O.   On: 11/15/2023 12:31    ____________________________________________   PROCEDURES  Procedure(s) performed:   Procedures   ____________________________________________   INITIAL IMPRESSION / ASSESSMENT AND PLAN / ED COURSE  Pertinent labs & imaging results that were available during my care of the patient were reviewed by me and considered in my medical decision making (see chart for details).   This patient is Presenting for Evaluation of ***, which {Range:23949} require a range of treatment options, and {MDMcomplaint:23950} a complaint that involves a {MDMlevelrisk:23951} risk of morbidity and mortality.  The Differential Diagnoses include***.  Critical Interventions-    Medications - No data to display  Reassessment after intervention:     I *** Additional Historical Information from ***, as the patient is ***.  I decided to review pertinent External Data, and in summary ***.   Clinical Laboratory Tests Ordered, included   Radiologic Tests Ordered, included ***. I independently interpreted the images and agree with radiology interpretation.   Cardiac Monitor Tracing which shows ***   Social Determinants of Health Risk ***  Consult complete with  Medical Decision Making: Summary: ***  Reevaluation with update and discussion with   ***Considered admission***  Patient's presentation is most consistent with {EM COPA:27473}   Disposition:   ____________________________________________  FINAL CLINICAL IMPRESSION(S) / ED DIAGNOSES  Final diagnoses:  None     NEW OUTPATIENT MEDICATIONS STARTED DURING THIS VISIT:  New Prescriptions   No medications on file    Note:  This document was prepared using Dragon voice recognition software and may include  unintentional dictation errors.  Alona Bene, MD, [facility] Emergency Medicine

## 2023-11-15 NOTE — ED Notes (Signed)
Bnp sent to lab at this time

## 2023-11-16 ENCOUNTER — Ambulatory Visit (HOSPITAL_COMMUNITY): Payer: Medicare HMO | Attending: Family Medicine | Admitting: Occupational Therapy

## 2023-11-16 ENCOUNTER — Telehealth: Payer: Self-pay

## 2023-11-16 ENCOUNTER — Encounter (HOSPITAL_COMMUNITY): Payer: Self-pay | Admitting: Occupational Therapy

## 2023-11-16 DIAGNOSIS — R29818 Other symptoms and signs involving the nervous system: Secondary | ICD-10-CM | POA: Insufficient documentation

## 2023-11-16 DIAGNOSIS — R278 Other lack of coordination: Secondary | ICD-10-CM | POA: Insufficient documentation

## 2023-11-16 NOTE — Transitions of Care (Post Inpatient/ED Visit) (Signed)
11/16/2023  Name: Joshua Compton. MRN: [id number] DOB: 10-13-1941  Today's TOC FU Call Status: Today's TOC FU Call Status:: Successful TOC FU Call Completed TOC FU Call Complete Date: 11/16/23 Patient's Name and Date of Birth confirmed.  Transition Care Management Follow-up Telephone Call Date of Discharge: 11/15/23 Discharge Facility: [facility]) Type of Discharge: Emergency Department Reason for ED Visit: Other: (edema) How have you been since you were released from the hospital?: Better Any questions or concerns?: No  Items Reviewed: Did you receive and understand the discharge instructions provided?: Yes Medications obtained,verified, and reconciled?: Yes (Medications Reviewed) Any new allergies since your discharge?: No Do you have support at home?: Yes People in Home: child(ren), adult  Medications Reviewed Today: Medications Reviewed Today     Reviewed by Karena Addison, LPN (Licensed Practical Nurse) on 11/16/23 at 1736  Med List Status: <None>   Medication Order Taking? Sig Documenting Provider Last Dose Status Informant  albuterol (VENTOLIN HFA) 108 (90 Base) MCG/ACT inhaler [id number] No Inhale 1-2 puffs into the lungs every 6 (six) hours as needed for wheezing or shortness of breath. Cristi Loron, Celeste A, PA-C Taking Active   amiodarone (PACERONE) 200 MG tablet [id number] No Take 1 tablet (200 mg total) by mouth 2 (two) times daily. X 20 days, then once daily starting 09/28/23 Tat, Onalee Hua, MD Taking Active   atorvastatin (LIPITOR) 40 MG tablet [id number] No TAKE 1 TABLET BY MOUTH EVERYDAY AT BEDTIME [name], MD Taking Active   cholecalciferol (VITAMIN D3) 25 MCG (1000 UNIT) tablet [id number] No TAKE 1 TABLET BY MOUTH EVERY DAY [name], MD Taking Active   dorzolamide-timolol (COSOPT) 22.3-6.8 MG/ML ophthalmic solution [id number] No Place 1 drop into both eyes daily.  [provider] Taking Active Self           Med Note Kai Levins, MELISSA B    Tue Apr 10, 2020  3:15 PM)    ELIQUIS 5 MG TABS tablet [id number] No TAKE 1 TABLET BY MOUTH TWICE A Lowella Fairy, MD Taking Active   [place] [address] [place] [id number] No TAKE 1 TABLET BY MOUTH TWICE A Lowella Fairy, MD Taking Active   FARXIGA 10 MG TABS tablet [id number] No TAKE 1 TABLET BY MOUTH EVERY DAY [name], MD Taking Active   fluticasone (FLONASE) 50 MCG/ACT nasal spray [id number] No SPRAY 2 SPRAYS INTO EACH NOSTRIL EVERY Lowella Fairy, MD Taking Active   furosemide (LASIX) 40 MG tablet [id number] No Take 1 tablet (40 mg total) by mouth daily.  Patient taking differently: Take 40 mg by mouth daily as needed for edema.   Catarina Hartshorn, MD Taking Active   metoprolol succinate (TOPROL-XL) 25 MG 24 hr tablet [id number] No Take 0.5 tablets (12.5 mg total) by mouth daily. For blood pressure control Sharlene Dory, NP Taking Active   pantoprazole (PROTONIX) 40 MG tablet [id number] No Take 1 tablet (40 mg total) by mouth daily. For heartburn [name], MD Taking Active   rOPINIRole (REQUIP) 1 MG tablet [id number] No Take 1 tablet (1 mg total) by mouth 3 (three) times daily. Plus an extra tablet at bedtime to total four a day [name], MD Taking Active             Home Care and Equipment/Supplies: Were Home Health Services Ordered?: NA Any new equipment or medical supplies ordered?: NA  Functional Questionnaire: Do you need assistance with bathing/showering or dressing?: No Do you need assistance with meal preparation?: No Do you need assistance with  eating?: No Do you have difficulty maintaining continence: No Do you need assistance with getting out of bed/getting out of a chair/moving?: No Do you have difficulty managing or taking your medications?: No  Follow up appointments reviewed: PCP Follow-up appointment confirmed?: Yes Date of PCP follow-up appointment?: 11/27/23 Follow-up Provider: [facility] Follow-up appointment confirmed?: NA Do you  need transportation to your follow-up appointment?: No Do you understand care options if your condition(s) worsen?: Yes-patient verbalized understanding    SIGNATURE Karena Addison, LPN [facility] Nurse Health Advisor Direct Dial [phone]

## 2023-11-16 NOTE — Therapy (Signed)
OUTPATIENT OCCUPATIONAL THERAPY NEURO TREATMENT NOTE  Patient Name: Joshua Compton. MRN: [id number] DOB:November 24, 1940, 82 y.o., male Today's Date: 11/16/2023    END OF SESSION:  OT End of Session - 11/16/23 1339     Visit Number 5    Number of Visits 8    Date for OT Re-Evaluation 11/27/23    Authorization Type [employer], $20 copay    Authorization Time Period no auth required    Progress Note Due on Visit 10    OT Start Time 1300    OT Stop Time 1345    OT Time Calculation (min) 45 min    Activity Tolerance Patient tolerated treatment well    Behavior During Therapy WFL for tasks assessed/performed                 Past Medical History:  Diagnosis Date   Arthritis    Cancer (HCC)    prostate ca   seed implants   GERD (gastroesophageal reflux disease)    Glaucoma    Hyperlipidemia    Hypertension    dr Modesto Charon     [place]   fm   Stroke [facility]) 07/2023   Past Surgical History:  Procedure Laterality Date   BACK SURGERY     x2   COLONOSCOPY     HERNIA REPAIR  11-06-2009   left inguinal repair    NECK SURGERY     ROTATOR CUFF REPAIR Left jan 2016   TOTAL HIP ARTHROPLASTY Right 04/20/2020   Procedure: RIGHT TOTAL HIP ARTHROPLASTY ANTERIOR APPROACH;  Surgeon: Tarry Kos, MD;  Location: [facility] OR;  Service: Orthopedics;  Laterality: Right;   TOTAL HIP ARTHROPLASTY Left 01/07/2021   Procedure: LEFT TOTAL HIP ARTHROPLASTY ANTERIOR APPROACH;  Surgeon: Tarry Kos, MD;  Location: [facility] OR;  Service: Orthopedics;  Laterality: Left;   TRANSURETHRAL RESECTION OF PROSTATE     Patient Active Problem List   Diagnosis Date Noted   High risk medication use 09/18/2023   Vitamin D deficiency 09/18/2023   Supplemental oxygen dependent 09/18/2023   Acute HFrEF (heart failure with reduced ejection fraction) (HCC) 09/08/2023   PAF (paroxysmal atrial fibrillation) (HCC) 09/07/2023   Acute respiratory failure with hypoxia (HCC) 09/05/2023   Acute heart failure with preserved  ejection fraction (HFpEF) (HCC) 09/05/2023   Lobar pneumonia (HCC) 09/05/2023   Cerebrovascular accident (CVA) due to embolism of left middle cerebral artery (HCC) 08/10/2023   Paresthesia of both feet 07/28/2023   Gastroesophageal reflux disease with esophagitis without hemorrhage 09/10/2021   Primary insomnia 09/10/2021   Vasculogenic erectile dysfunction 09/10/2021   Status post total replacement of left hip 01/07/2021   Primary osteoarthritis of left hip 01/06/2021   Leg cramps 12/11/2020   Thyromegaly 05/19/2017   Neoplasm of scalp 12/08/2013   Rhinitis 12/08/2013   Cough 12/08/2013   Hyperlipidemia    Cancer (HCC)    Arthritis    Severe stage glaucoma 03/26/2012   Nuclear sclerosis 03/26/2012   Primary open angle glaucoma 10/27/2011   Essential hypertension 02/04/2011   Degenerative disc disease 02/04/2011   Gastrointestinal bleed 02/04/2011   Anemia 02/04/2011   Prostate cancer (HCC) 02/04/2011   PCP: Dr. [name] REFERRING PROVIDER: Dr. [name]  ONSET DATE: 08/07/23  REFERRING DIAG: I69.998,R53.1 (ICD-10-CM) - Weakness as late effect of cerebrovascular accident (CVA)   THERAPY DIAG:  Other lack of coordination  Other symptoms and signs involving the nervous system  Rationale for Evaluation and Treatment: Rehabilitation  SUBJECTIVE:   SUBJECTIVE  STATEMENT: S: I had a lot of fluid on my legs yesterday  Pt accompanied by: self  PERTINENT HISTORY: Pt is an 82 y/o male who reports he had a stroke on September 19th. From [facility]. Left sided CVA impacting the R side.   PRECAUTIONS: Fall  WEIGHT BEARING RESTRICTIONS: No  PAIN:  Are you having pain? No  FALLS: Has patient fallen in last 6 months? No  PLOF: Independent  PATIENT GOALS: To get stronger and resume independence in ADLs.   OBJECTIVE:  Note: Objective measures were completed at Evaluation unless otherwise noted.  HAND DOMINANCE: Right  ADLs: Overall ADLs: Pt reports  difficulty with gripping items, pt is unable to use a toothbrush or razor, is using LUE for ADLs. Pt is unable to tie his shoes, has not tried to hold a pencil. Using left hand to feed himself.  Pt enjoys golfing, fishing, and yardwork.    FUNCTIONAL OUTCOME MEASURES: FOTO: 78/100   UPPER EXTREMITY ROM:      A/ROM of BUE is [facility]  UPPER EXTREMITY MMT:     MMT Right eval  Shoulder flexion 5/5  Shoulder abduction 4/5 (same as LUE)  Shoulder internal rotation 5/5  Shoulder external rotation 5/5  Elbow flexion 5/5  Elbow extension 5/5  Wrist flexion 4/5  Wrist extension 3+/5  Wrist ulnar deviation 3+/5  Wrist radial deviation 3+/5  Wrist pronation 5/5  Wrist supination 5/5  (Blank rows = not tested)  HAND FUNCTION: Grip strength: Right: 22 lbs; Left: 110 lbs, Lateral pinch: Right: 4 lbs, Left: 26 lbs, and 3 point pinch: Right: unable lbs, Left: 17 lbs  COORDINATION: 9 Hole Peg test: Right: 1'48" with set-up sec; Left: 29.94 sec  SENSATION: WFL  EDEMA: mild edema in hand  COGNITION: Overall cognitive status: Within functional limits for tasks assessed  VISION: Subjective report: No change Baseline vision: No visual deficits Visual history: glaucoma   TODAY'S TREATMENT:                                                                                                                              DATE:   11/16/23 -Wrist Strengthening: 3#, flexion, extension, ulnar/radial deviation, supination/pronation, x12 - Digit ROM: composite fist, finger opposition to thumb (unable to do pinky at this time) 5x each - Grip strength: squeezing black grip strength tool 20x - UE strength: shoulder press, shoulder flexion, curls 10x each 3lb dumb bells - Pincer grasp: stacking 4 block tower 4x with little foam blocks, playing connect four picking up coins off of table and intermittent min assist to place into game board  - Pinch strength: yellow and green clips on and off tree       11/13/23 -Wrist Strengthening: 3#, flexion, extension, ulnar/radial deviation, supination/pronation, x12 - Digit ROM: composite fist, finger opposition to thumb (unable to do pinky at this time) 5x each  - Pinch strength: yellow and green clips with thumb and index finger  -  Yellow putty: Roll into a ball, flatten into a pancake, roll into a log, tripod pinch, lateral pinch, roll into a ball, full squeeze x10 - Pincer grasp: picking up large pegs and using palm to pres into board 20x, slotting coins 20x with L hand    11/05/23 -Wrist Strengthening: 2#, flexion, extension, ulnar/radial deviation, supination/protraction, x12 -Towel scrunches x5 -Towel roll squeezes, x10 -Towel Wringing out, x10 -Cards: holding in L hand and picking up 1 at a time with R hand to flip over, attempted picking up cards off table, however max difficulty with picking up unassisted -Digit Rom: composite flexion, abduction, finger taps, opposition, x10 -Large Peg Board and pattern     PATIENT EDUCATION: Education details: Wrist and Digit ROM Person educated: Patient Education method: Explanation, Demonstration, and Handouts Education comprehension: verbalized understanding and returned demonstration  HOME EXERCISE PROGRAM: Eval: yellow theraputty-grip and pinch strengthening 12/17: Wrist and digit ROM 12/27: pinch activities    GOALS: Goals reviewed with patient? Yes  SHORT TERM GOALS: Target date: 11/27/23  Pt will be provided with and educated on HEP to improve mobility and use of RUE required for ADL completion.   Goal status: IN PROGRESS  2.  Pt will increase RUE strength to 4+/5 to improve ability to perform yardwork and fishing activities using RUE as dominant.   Goal status: IN PROGRESS  3.  Pt will increase right grip strength by 15+ pounds and pinch strength by 4+ pounds to improve ability to grip and utilize tools such as a toothbrush or razor with the right hand.   Goal status: IN  PROGRESS  4.  Pt will improve RUE coordination required for tying shoes and operate fishing gear by completing 9 hole peg test in under 1'.   Goal status: IN PROGRESS    ASSESSMENT:  CLINICAL IMPRESSION: Pt reports that he went to the ED yesterday due to edema in the legs, this has went away. Session began with UE and wrist strengthening activities. Pt was able to pick up connect four coins off of table using pincer grasp with increased time. Rest breaks given throughout session due to fatigue. Pt would continue to benefit form OT.   PERFORMANCE DEFICITS: in functional skills including ADLs, IADLs, coordination, dexterity, edema, tone, ROM, strength, and UE functional use   PLAN:  OT FREQUENCY: 2x/week  OT DURATION: 4 weeks  PLANNED INTERVENTIONS: 97168 OT Re-evaluation, 97535 self care/ADL training, 62376 therapeutic exercise, 97530 therapeutic activity, 97112 neuromuscular re-education, 97140 manual therapy, 97018 paraffin, 28315 electrical stimulation (manual), patient/family education, and DME and/or AE instructions  RECOMMENDED OTHER SERVICES: PT evaluation  CONSULTED AND AGREED WITH PLAN OF CARE: Patient  PLAN FOR NEXT SESSION: follow up on HEP, begin wrist and grip strengthening, coordination work   Lurena Joiner, OTR/L [phone] 11/16/2023, 1:40 PM

## 2023-11-19 ENCOUNTER — Ambulatory Visit (HOSPITAL_COMMUNITY): Payer: PPO | Admitting: Speech Pathology

## 2023-11-19 ENCOUNTER — Ambulatory Visit (HOSPITAL_COMMUNITY): Payer: PPO | Admitting: Occupational Therapy

## 2023-11-21 NOTE — Telephone Encounter (Signed)
 Copied from CRM [id number]. Topic: Clinical - Home Health Verbal Orders >> Sep 28, 2023  9:46 AM [name] wrote: Please notify home health of verbal order for speech therapy.

## 2023-11-23 NOTE — Telephone Encounter (Signed)
 LMOVM to [place] Development worker, international aid w/ [facility], giving VO to add speech therapy

## 2023-11-25 ENCOUNTER — Ambulatory Visit: Payer: Self-pay | Admitting: Nurse Practitioner

## 2023-11-26 ENCOUNTER — Telehealth: Payer: Self-pay | Admitting: Family Medicine

## 2023-11-26 NOTE — Telephone Encounter (Signed)
 Copied from CRM [id number]. Topic: General - Other >> Nov 26, 2023  1:16 PM Zacyrah J wrote: Reason for CRM: pt daughter is calling in regarding father home care assistance. The place (the daughter doesn't remember the name at the moment) fax over paperwork for Dr. Zollie to fill out. Dr. Zollie hasn't fax paperwork back. Daughter will like to get this message to Dr. Zollie.

## 2023-11-27 ENCOUNTER — Telehealth: Payer: Self-pay | Admitting: Cardiovascular Disease

## 2023-11-27 ENCOUNTER — Inpatient Hospital Stay: Payer: Medicare HMO | Admitting: Family Medicine

## 2023-11-27 NOTE — Telephone Encounter (Signed)
 Me     11/27/23  2:06 PM Note Reports swelling in both legs that led to ED on 11/15/2023. Reports he was advised to take lasix  40 mg on Sunday (11/15/2023) and Monday (11/16/2023) and contact PCP or cardiologist for further instructions. Reports swelling is about the same as it was during ED visit on 11/15/2023. Reports taking lasix  40 mg x's 1 on 11/24/2023. Denies pain, redness or drainage. Denies chest pain, SOB or dizziness. Reports he does not weigh daily. Encouraged daily weights. Denies increased sodium or fluid intake. Reports wearing compression stockings and keep legs elevated at rest. Reports has upcoming visit with PCP on 11/30/2023 for ED follow up. Advised to take lasix  40 mg daily throughout weekend (Friday, Saturday & Sunday) and stay well hydrated. Advised to request PCP order lab work to check kidney function on Monday. Advised if he develops worsening symptoms, to go to the ED for an evaluation.

## 2023-11-27 NOTE — Telephone Encounter (Signed)
 Pt's daughter is requesting a callback regarding her MyChart message sent. Please advise  My dad's legs keep swelling and he was seen on 12/29 at [facility] ER for this issue. He has been wearing his compression socks, elevating his legs but they are not going down. The ER doctor had him to take Lasix  pill that Sunday and one Monday. His legs wear still swollen on the 7th and he took one then. I know you all do not want my father to take that pill due to it affecting his kidneys but my dad wanted me to ask if it would be okay for him to take a pill.

## 2023-11-27 NOTE — Telephone Encounter (Signed)
 Reports swelling in both legs that led to ED on 11/15/2023. Reports he was advised to take lasix  40 mg on Sunday (11/15/2023) and Monday (11/16/2023) and contact PCP or cardiologist for further instructions. Reports swelling is about the same as it was during ED visit on 11/15/2023. Reports taking lasix  40 mg x's 1 on 11/24/2023. Denies pain, redness or drainage. Denies chest pain, SOB or dizziness. Reports he does not weigh daily. Encouraged daily weights. Denies increased sodium or fluid intake. Reports wearing compression stockings and keep legs elevated at rest. Reports has upcoming visit with PCP on 11/30/2023 for ED follow up. Advised to take lasix  40 mg daily throughout weekend (Friday, Saturday & Sunday) and stay well hydrated. Advised to request PCP order lab work to check kidney function on Monday. Advised if he develops worsening symptoms, to go to the ED for an evaluation.

## 2023-11-27 NOTE — Telephone Encounter (Signed)
 Daughter was not sure what company it was coming from, but the paper is on PCP's desk to complete.

## 2023-11-27 NOTE — Telephone Encounter (Signed)
 MyChart message forwarded to [facility]

## 2023-11-30 ENCOUNTER — Ambulatory Visit (INDEPENDENT_AMBULATORY_CARE_PROVIDER_SITE_OTHER): Payer: PPO | Admitting: Family Medicine

## 2023-11-30 ENCOUNTER — Encounter: Payer: Self-pay | Admitting: Family Medicine

## 2023-11-30 VITALS — BP 135/54 | HR 56 | Temp 97.4°F | Ht 72.0 in | Wt 203.4 lb

## 2023-11-30 DIAGNOSIS — R531 Weakness: Secondary | ICD-10-CM

## 2023-11-30 DIAGNOSIS — I6930 Unspecified sequelae of cerebral infarction: Secondary | ICD-10-CM | POA: Diagnosis not present

## 2023-11-30 DIAGNOSIS — I10 Essential (primary) hypertension: Secondary | ICD-10-CM | POA: Diagnosis not present

## 2023-11-30 DIAGNOSIS — I5022 Chronic systolic (congestive) heart failure: Secondary | ICD-10-CM | POA: Diagnosis not present

## 2023-11-30 MED ORDER — ROPINIROLE HCL 1 MG PO TABS: 1.0000 mg | ORAL_TABLET | Freq: Three times a day (TID) | ORAL | 3 refills | Status: DC

## 2023-11-30 MED ORDER — ENTRESTO 24-26 MG PO TABS: 1.0000 | ORAL_TABLET | Freq: Two times a day (BID) | ORAL | 3 refills | Status: DC

## 2023-11-30 MED ORDER — FUROSEMIDE 20 MG PO TABS: 20.0000 mg | ORAL_TABLET | Freq: Every day | ORAL | 1 refills | Status: DC

## 2023-11-30 MED ORDER — DAPAGLIFLOZIN PROPANEDIOL 10 MG PO TABS: 10.0000 mg | ORAL_TABLET | Freq: Every day | ORAL | 3 refills | Status: DC

## 2023-11-30 MED ORDER — APIXABAN 5 MG PO TABS: 5.0000 mg | ORAL_TABLET | Freq: Two times a day (BID) | ORAL | 3 refills | Status: DC

## 2023-11-30 NOTE — Progress Notes (Addendum)
 Subjective:  Patient ID: Joshua Vicci Raddle., male    DOB: Jan 10, 1941  Age: 83 y.o. MRN: [id number]  CC: stroke (Follow up on stroke)   HPI Joshua Compton. presents for right sides weakness, going to rehab. Progress is slow. No dyspnea, but legs swell.  Learning to grip with his right hand again.  Doing various exercises to strengthen the hand arm and right leg.  He is able to walk but has a bit of weakness remaining.  Its causes him to have some imbalance at times.  No reported falling is noted.     10/19/2023    1:08 PM 07/28/2023    8:57 AM 04/30/2023    2:17 PM  Depression screen PHQ 2/9  Decreased Interest 0 0 0  Down, Depressed, Hopeless 0 0 0  PHQ - 2 Score 0 0 0    History Joshua Compton has a past medical history of Arthritis, Cancer (HCC), GERD (gastroesophageal reflux disease), Glaucoma, Hyperlipidemia, Hypertension, and Stroke (HCC) (07/2023).   He has a past surgical history that includes Back surgery; Neck surgery; Transurethral resection of prostate; Hernia repair (11-06-2009); Rotator cuff repair (Left, jan 2016); Colonoscopy; Total hip arthroplasty (Right, 04/20/2020); and Total hip arthroplasty (Left, 01/07/2021).   His family history includes Cancer in his brother, brother, and mother; Heart disease in his father.He reports that he quit smoking about 18 years ago. His smoking use included cigarettes. He started smoking about 48 years ago. He has a 15 pack-year smoking history. He has never used smokeless tobacco. He reports that he does not drink alcohol and does not use drugs.    ROS Review of Systems  Constitutional: Negative.  Negative for fever.  HENT: Negative.    Eyes:  Negative for visual disturbance.  Respiratory:  Negative for cough and shortness of breath.   Cardiovascular:  Negative for chest pain and leg swelling.  Gastrointestinal:  Negative for abdominal pain, diarrhea, nausea and vomiting.  Genitourinary:  Negative for difficulty urinating.   Musculoskeletal:  Positive for myalgias. Negative for arthralgias.  Skin:  Negative for rash.  Neurological:  Positive for weakness (Right upper extremity.  Focusing on the grip with therapy.  Also some weakness of the shoulder making it difficult to abduct greater than 90 degrees.). Negative for headaches.  Psychiatric/Behavioral:  Negative for sleep disturbance.     Objective:  BP (!) 135/54   Pulse (!) 56   Temp (!) 97.4 F (36.3 C)   Ht 6' (1.829 m)   Wt 203 lb 6.4 oz (92.3 kg)   SpO2 (!) 89%   BMI 27.59 kg/m   BP Readings from Last 3 Encounters:  12/03/23 (!) 142/70  11/30/23 (!) 135/54  11/15/23 (!) 176/67    Wt Readings from Last 3 Encounters:  12/03/23 203 lb (92.1 kg)  11/30/23 203 lb 6.4 oz (92.3 kg)  11/12/23 210 lb 3.2 oz (95.3 kg)     Physical Exam Vitals reviewed.  Constitutional:      Appearance: He is well-developed.  HENT:     Head: Normocephalic and atraumatic.     Right Ear: External ear normal.     Left Ear: External ear normal.     Mouth/Throat:     Pharynx: No oropharyngeal exudate or posterior oropharyngeal erythema.  Eyes:     Pupils: Pupils are equal, round, and reactive to light.  Cardiovascular:     Rate and Rhythm: Normal rate and regular rhythm.     Heart sounds: No murmur heard. Pulmonary:  Effort: No respiratory distress.     Breath sounds: Normal breath sounds.  Musculoskeletal:        General: Swelling (Mild edema, but significant considering the early time of day.  Noted primarily at the lower legs.) present. No tenderness or deformity.     Cervical back: Normal range of motion and neck supple.  Neurological:     Mental Status: He is alert and oriented to person, place, and time.       Assessment & Plan:   Joshua Compton was seen today for stroke.  Diagnoses and all orders for this visit:  Chronic systolic congestive heart failure (HCC) -     CMP14+EGFR -     CBC with Differential/Platelet -     Brain natriuretic  peptide  Essential hypertension -     CMP14+EGFR -     CBC with Differential/Platelet -     Brain natriuretic peptide  Weakness of right side of body -     CMP14+EGFR -     CBC with Differential/Platelet -     Brain natriuretic peptide  Late effect of cerebrovascular accident (CVA) -     CMP14+EGFR -     CBC with Differential/Platelet -     Brain natriuretic peptide  Other orders -     apixaban  (ELIQUIS ) 5 MG TABS tablet; Take 1 tablet (5 mg total) by mouth 2 (two) times daily. -     sacubitril -valsartan  (ENTRESTO ) 24-26 MG; Take 1 tablet by mouth 2 (two) times daily. -     dapagliflozin  propanediol (FARXIGA ) 10 MG TABS tablet; Take 1 tablet (10 mg total) by mouth daily. -     rOPINIRole  (REQUIP ) 1 MG tablet; Take 1 tablet (1 mg total) by mouth 3 (three) times daily. Plus an extra tablet at bedtime to total four a day -     furosemide  (LASIX ) 20 MG tablet; Take 1 tablet (20 mg total) by mouth daily. To reduce swelling       I have changed Joshua Louder Jr.'s Eliquis  to apixaban  and Farxiga  to dapagliflozin  propanediol. I have also changed his Entresto  and furosemide . I am also having him maintain his dorzolamide -timolol , albuterol , pantoprazole , amiodarone , fluticasone , cholecalciferol , metoprolol  succinate, atorvastatin , and rOPINIRole .  Allergies as of 11/30/2023       Reactions   Iodinated Contrast Media Anaphylaxis   Tongue swelled up when he had MRI in [facility] Intubated for airway protection after oral and throat swelling   Multihance  [gadobenate] Swelling   Tongue swelled up when he had MRI in [facility]   Pravastatin  Other (See Comments)   Muscle aches   Pravastatin  Sodium    Muscle aches        Medication List        Accurate as of November 30, 2023 11:59 PM. If you have any questions, ask your nurse or doctor.          albuterol  108 (90 Base) MCG/ACT inhaler Commonly known as: VENTOLIN  HFA Inhale 1-2 puffs into the lungs every 6 (six) hours as  needed for wheezing or shortness of breath.   amiodarone  200 MG tablet Commonly known as: PACERONE  Take 1 tablet (200 mg total) by mouth 2 (two) times daily. X 20 days, then once daily starting 09/28/23   apixaban  5 MG Tabs tablet Commonly known as: Eliquis  Take 1 tablet (5 mg total) by mouth 2 (two) times daily.   atorvastatin  40 MG tablet Commonly known as: LIPITOR TAKE 1 TABLET BY MOUTH EVERYDAY AT BEDTIME  cholecalciferol  25 MCG (1000 UNIT) tablet Commonly known as: VITAMIN D3 TAKE 1 TABLET BY MOUTH EVERY DAY   dapagliflozin  propanediol 10 MG Tabs tablet Commonly known as: Farxiga  Take 1 tablet (10 mg total) by mouth daily.   dorzolamide -timolol  2-0.5 % ophthalmic solution Commonly known as: COSOPT  Place 1 drop into both eyes daily.   Entresto  24-26 MG Generic drug: sacubitril -valsartan  Take 1 tablet by mouth 2 (two) times daily.   fluticasone  50 MCG/ACT nasal spray Commonly known as: FLONASE  SPRAY 2 SPRAYS INTO EACH NOSTRIL EVERY DAY   furosemide  20 MG tablet Commonly known as: LASIX  Take 1 tablet (20 mg total) by mouth daily. To reduce swelling What changed:  medication strength how much to take additional instructions Changed by: Butler Jeriel Vivanco   metoprolol  succinate 25 MG 24 hr tablet Commonly known as: TOPROL -XL Take 0.5 tablets (12.5 mg total) by mouth daily. For blood pressure control   pantoprazole  40 MG tablet Commonly known as: PROTONIX  Take 1 tablet (40 mg total) by mouth daily. For heartburn   rOPINIRole  1 MG tablet Commonly known as: REQUIP  Take 1 tablet (1 mg total) by mouth 3 (three) times daily. Plus an extra tablet at bedtime to total four a day         Follow-up: Return in about 6 weeks (around 01/11/2024) for CHF, .  Butler Der, M.D.

## 2023-11-30 NOTE — Telephone Encounter (Signed)
 Aware paperwork has been faxed.

## 2023-12-01 LAB — CBC WITH DIFFERENTIAL/PLATELET
Basophils Absolute: 0.1 10*3/uL (ref 0.0–0.2)
Basos: 1 %
EOS (ABSOLUTE): 0.2 10*3/uL (ref 0.0–0.4)
Eos: 3 %
Hematocrit: 42.4 % (ref 37.5–51.0)
Hemoglobin: 13.5 g/dL (ref 13.0–17.7)
Immature Grans (Abs): 0 10*3/uL (ref 0.0–0.1)
Immature Granulocytes: 0 %
Lymphocytes Absolute: 1.2 10*3/uL (ref 0.7–3.1)
Lymphs: 22 %
MCH: 30.5 pg (ref 26.6–33.0)
MCHC: 31.8 g/dL (ref 31.5–35.7)
MCV: 96 fL (ref 79–97)
Monocytes Absolute: 0.5 10*3/uL (ref 0.1–0.9)
Monocytes: 9 %
Neutrophils Absolute: 3.5 10*3/uL (ref 1.4–7.0)
Neutrophils: 65 %
Platelets: 238 10*3/uL (ref 150–450)
RBC: 4.42 x10E6/uL (ref 4.14–5.80)
RDW: 13.2 % (ref 11.6–15.4)
WBC: 5.3 10*3/uL (ref 3.4–10.8)

## 2023-12-01 LAB — CMP14+EGFR
ALT: 16 IU/L (ref 0–44)
AST: 19 IU/L (ref 0–40)
Albumin: 4.2 g/dL (ref 3.7–4.7)
Alkaline Phosphatase: 110 IU/L (ref 44–121)
BUN/Creatinine Ratio: 11 (ref 10–24)
BUN: 22 mg/dL (ref 8–27)
Bilirubin Total: 0.7 mg/dL (ref 0.0–1.2)
CO2: 26 mmol/L (ref 20–29)
Calcium: 9.7 mg/dL (ref 8.6–10.2)
Chloride: 104 mmol/L (ref 96–106)
Creatinine, Ser: 1.94 mg/dL — ABNORMAL HIGH (ref 0.76–1.27)
Globulin, Total: 2.3 g/dL (ref 1.5–4.5)
Glucose: 93 mg/dL (ref 70–99)
Potassium: 4.1 mmol/L (ref 3.5–5.2)
Sodium: 145 mmol/L — ABNORMAL HIGH (ref 134–144)
Total Protein: 6.5 g/dL (ref 6.0–8.5)
eGFR: 34 mL/min/{1.73_m2} — ABNORMAL LOW (ref >59)

## 2023-12-01 LAB — BRAIN NATRIURETIC PEPTIDE: BNP: 144.4 pg/mL — ABNORMAL HIGH (ref 0.0–100.0)

## 2023-12-01 NOTE — Telephone Encounter (Signed)
 Per daughter, swelling in LE has improved and furosemide dose was decreased by PCP yesterday. Gave appointment to see Branch on 12/03/2023 @11 :00 am at the [place] office.

## 2023-12-03 ENCOUNTER — Ambulatory Visit: Payer: PPO | Attending: Cardiology | Admitting: Cardiology

## 2023-12-03 ENCOUNTER — Other Ambulatory Visit: Payer: Self-pay | Admitting: *Deleted

## 2023-12-03 ENCOUNTER — Encounter: Payer: Self-pay | Admitting: Cardiology

## 2023-12-03 VITALS — BP 142/70 | HR 53 | Ht 72.0 in | Wt 203.0 lb

## 2023-12-03 DIAGNOSIS — I10 Essential (primary) hypertension: Secondary | ICD-10-CM | POA: Diagnosis not present

## 2023-12-03 DIAGNOSIS — D6869 Other thrombophilia: Secondary | ICD-10-CM | POA: Diagnosis not present

## 2023-12-03 DIAGNOSIS — I4891 Unspecified atrial fibrillation: Secondary | ICD-10-CM | POA: Diagnosis not present

## 2023-12-03 DIAGNOSIS — I5022 Chronic systolic (congestive) heart failure: Secondary | ICD-10-CM | POA: Diagnosis not present

## 2023-12-03 DIAGNOSIS — N289 Disorder of kidney and ureter, unspecified: Secondary | ICD-10-CM

## 2023-12-03 NOTE — Progress Notes (Signed)
Clinical Summary Mr. Mccallie is a 83 y.o.male seen today for follow up of the following medical problems.   1.PAF - new diagnosis during 08/2023 admission - - patient with recent CVA, afib possible related  - started on oral amio to try to restore SR in setting of declining LVEF.   EKG today sinus brady 53  - no recent palpitations - compliant with meds - no bleeding on elqiuis. No lightheadedness/dizziness.   2. HFrEF - 07/2023 echo [facility]: LVE 55-60%, mild AI - 08/2023 echo this admit: LVEF 40-45% - drop in LVEF in setting of newly diagnosed afib, perhaps tachy mediated. Also with pneumonia this admission, possible stress induced CM. CT chest this admit did show severe LM and 3 vessel disease as well, risk for ischemic CM. - Would let him recover from recent stroke prior to considering an ischemic evaluation.   - no SOB/DOE - prior LE edema that has improved - pcp lowered lasix to 20mg  daily due to uptrend    3. History of CVA     Past Medical History:  Diagnosis Date   Arthritis    Cancer (HCC)    prostate ca   seed implants   GERD (gastroesophageal reflux disease)    Glaucoma    Hyperlipidemia    Hypertension    dr Modesto Charon     [facility]   fm   Stroke [facility]) 07/2023     Allergies  Allergen Reactions   Iodinated Contrast Media Anaphylaxis    Tongue swelled up when he had MRI in [facility]  Intubated for airway protection after oral and throat swelling   Multihance [Gadobenate] Swelling    Tongue swelled up when he had MRI in [facility]   Pravastatin Other (See Comments)    Muscle aches   Pravastatin Sodium     Muscle aches     Current Outpatient Medications  Medication Sig Dispense Refill   albuterol (VENTOLIN HFA) 108 (90 Base) MCG/ACT inhaler Inhale 1-2 puffs into the lungs every 6 (six) hours as needed for wheezing or shortness of breath. 18 g 0   amiodarone (PACERONE) 200 MG tablet Take 1 tablet (200 mg total) by mouth 2 (two) times daily. X  20 days, then once daily starting 09/28/23 60 tablet 1   apixaban (ELIQUIS) 5 MG TABS tablet Take 1 tablet (5 mg total) by mouth 2 (two) times daily. 60 tablet 3   atorvastatin (LIPITOR) 40 MG tablet TAKE 1 TABLET BY MOUTH EVERYDAY AT BEDTIME 90 tablet 1   cholecalciferol (VITAMIN D3) 25 MCG (1000 UNIT) tablet TAKE 1 TABLET BY MOUTH EVERY DAY 30 tablet 5   dapagliflozin propanediol (FARXIGA) 10 MG TABS tablet Take 1 tablet (10 mg total) by mouth daily. 90 tablet 3   dorzolamide-timolol (COSOPT) 22.3-6.8 MG/ML ophthalmic solution Place 1 drop into both eyes daily.      fluticasone (FLONASE) 50 MCG/ACT nasal spray SPRAY 2 SPRAYS INTO EACH NOSTRIL EVERY DAY 48 mL 2   furosemide (LASIX) 20 MG tablet Take 1 tablet (20 mg total) by mouth daily. To reduce swelling 90 tablet 1   metoprolol succinate (TOPROL-XL) 25 MG 24 hr tablet Take 0.5 tablets (12.5 mg total) by mouth daily. For blood pressure control 45 tablet 1   pantoprazole (PROTONIX) 40 MG tablet Take 1 tablet (40 mg total) by mouth daily. For heartburn 90 tablet 3   rOPINIRole (REQUIP) 1 MG tablet Take 1 tablet (1 mg total) by mouth 3 (three) times daily. Plus  an extra tablet at bedtime to total four a day 360 tablet 3   sacubitril-valsartan (ENTRESTO) 24-26 MG Take 1 tablet by mouth 2 (two) times daily. 60 tablet 3   No current facility-administered medications for this visit.     Past Surgical History:  Procedure Laterality Date   BACK SURGERY     x2   COLONOSCOPY     HERNIA REPAIR  11-06-2009   left inguinal repair    NECK SURGERY     ROTATOR CUFF REPAIR Left jan 2016   TOTAL HIP ARTHROPLASTY Right 04/20/2020   Procedure: RIGHT TOTAL HIP ARTHROPLASTY ANTERIOR APPROACH;  Surgeon: Tarry Kos, MD;  Location: [facility] OR;  Service: Orthopedics;  Laterality: Right;   TOTAL HIP ARTHROPLASTY Left 01/07/2021   Procedure: LEFT TOTAL HIP ARTHROPLASTY ANTERIOR APPROACH;  Surgeon: Tarry Kos, MD;  Location: [facility] OR;  Service: Orthopedics;   Laterality: Left;   TRANSURETHRAL RESECTION OF PROSTATE       Allergies  Allergen Reactions   Iodinated Contrast Media Anaphylaxis    Tongue swelled up when he had MRI in [facility]  Intubated for airway protection after oral and throat swelling   Multihance [Gadobenate] Swelling    Tongue swelled up when he had MRI in [facility]   Pravastatin Other (See Comments)    Muscle aches   Pravastatin Sodium     Muscle aches      Family History  Problem Relation Age of Onset   Cancer Mother        breast   Heart disease Father    Cancer Brother    Cancer Brother      Social History Mr. Rennaker reports that he quit smoking about 18 years ago. His smoking use included cigarettes. He started smoking about 48 years ago. He has a 15 pack-year smoking history. He has never used smokeless tobacco. Mr. Larmon reports no history of alcohol use.   Review of Systems CONSTITUTIONAL: No weight loss, fever, chills, weakness or fatigue.  HEENT: Eyes: No visual loss, blurred vision, double vision or yellow sclerae.No hearing loss, sneezing, congestion, runny nose or sore throat.  SKIN: No rash or itching.  CARDIOVASCULAR: per hpi RESPIRATORY: No shortness of breath, cough or sputum.  GASTROINTESTINAL: No anorexia, nausea, vomiting or diarrhea. No abdominal pain or blood.  GENITOURINARY: No burning on urination, no polyuria NEUROLOGICAL: No headache, dizziness, syncope, paralysis, ataxia, numbness or tingling in the extremities. No change in bowel or bladder control.  MUSCULOSKELETAL: No muscle, back pain, joint pain or stiffness.  LYMPHATICS: No enlarged nodes. No history of splenectomy.  PSYCHIATRIC: No history of depression or anxiety.  ENDOCRINOLOGIC: No reports of sweating, cold or heat intolerance. No polyuria or polydipsia.  Marland Kitchen   Physical Examination Today's Vitals   12/03/23 1105  BP: (!) 142/70  Pulse: (!) 53  SpO2: 92%  Weight: 203 lb (92.1 kg)  Height: 6' (1.829 m)    Body mass index is 27.53 kg/m.  Gen: resting comfortably, no acute distress HEENT: no scleral icterus, pupils equal round and reactive, no palptable cervical adenopathy,  CV: RRR, no mrg, no jvd Resp: Clear to auscultation bilaterally GI: abdomen is soft, non-tender, non-distended, normal bowel sounds, no hepatosplenomegaly MSK: extremities are warm, no edema.  Skin: warm, no rash Neuro:  no focal deficits Psych: appropriate affect      Assessment and Plan   1.PAF/acquired thrombophilia - no recent symptoms - EKG today shows sinus brady low 50s - continue current meds  2.Chronic  HFmrEF - repeat echo to reassess LV function - recent rise in Cr, pcp has already lowered lasix with repeat labs coming up - no symptoms  3. HTN - elevated today, more at goal last other provider visits - bring home bp log when he comes for echo. With renal function and low HRs likely would have to add new medication, likely norvasc      Antoine Poche, M.D.

## 2023-12-03 NOTE — Patient Instructions (Addendum)
Medication Instructions:  Your physician recommends that you continue on your current medications as directed. Please refer to the Current Medication list given to you today.  *If you need a refill on your cardiac medications before your next appointment, please call your pharmacy*   Lab Work: None If you have labs (blood work) drawn today and your tests are completely normal, you will receive your results only by: MyChart Message (if you have MyChart) OR A paper copy in the mail If you have any lab test that is abnormal or we need to change your treatment, we will call you to review the results.   Testing/Procedures: Your physician has requested that you have an echocardiogram. Echocardiography is a painless test that uses sound waves to create images of your heart. It provides your doctor with information about the size and shape of your heart and how well your heart's chambers and valves are working. This procedure takes approximately one hour. There are no restrictions for this procedure. Please do NOT wear cologne, perfume, aftershave, or lotions (deodorant is allowed). Please arrive 15 minutes prior to your appointment time.  Please note: We ask at that you not bring children with you during ultrasound (echo/ vascular) testing. Due to room size and safety concerns, children are not allowed in the ultrasound rooms during exams. Our front office staff cannot provide observation of children in our lobby area while testing is being conducted. An adult accompanying a patient to their appointment will only be allowed in the ultrasound room at the discretion of the ultrasound technician under special circumstances. We apologize for any inconvenience.    Follow-Up: At [facility], you and your health needs are our priority.  As part of our continuing mission to provide you with exceptional heart care, we have created designated Provider Care Teams.  These Care Teams include your  primary Cardiologist (physician) and Advanced Practice Providers (APPs -  Physician Assistants and Nurse Practitioners) who all work together to provide you with the care you need, when you need it.  We recommend signing up for the patient portal called "MyChart".  Sign up information is provided on this After Visit Summary.  MyChart is used to connect with patients for Virtual Visits (Telemedicine).  Patients are able to view lab/test results, encounter notes, upcoming appointments, etc.  Non-urgent messages can be sent to your provider as well.   To learn more about what you can do with MyChart, go to [email or web address].    Your next appointment:   3 month(s)  Provider:   You may see Dina Rich, MD or one of the following Advanced Practice Providers on your designated Care Team:   Randall An, PA-C  Jacolyn Reedy, [place]     Other Instructions Please bring your blood pressure log to our office the day of your Echocardiogram.

## 2023-12-07 ENCOUNTER — Encounter (HOSPITAL_COMMUNITY): Payer: Self-pay | Admitting: Occupational Therapy

## 2023-12-07 ENCOUNTER — Encounter (HOSPITAL_COMMUNITY): Payer: Self-pay | Admitting: Speech Pathology

## 2023-12-07 ENCOUNTER — Ambulatory Visit (HOSPITAL_COMMUNITY): Payer: PPO | Admitting: Occupational Therapy

## 2023-12-07 ENCOUNTER — Ambulatory Visit (HOSPITAL_COMMUNITY): Payer: PPO | Attending: Family Medicine | Admitting: Speech Pathology

## 2023-12-07 DIAGNOSIS — R29818 Other symptoms and signs involving the nervous system: Secondary | ICD-10-CM | POA: Insufficient documentation

## 2023-12-07 DIAGNOSIS — R278 Other lack of coordination: Secondary | ICD-10-CM | POA: Insufficient documentation

## 2023-12-07 DIAGNOSIS — R471 Dysarthria and anarthria: Secondary | ICD-10-CM | POA: Diagnosis not present

## 2023-12-07 NOTE — Therapy (Signed)
OUTPATIENT OCCUPATIONAL THERAPY NEURO TREATMENT NOTE REASSESSMENT/RECERTIFICATION  Patient Name: Joshua Compton. MRN: [id number] DOB:1941/09/15, 83 y.o., male Today's Date: 12/07/2023  Progress Note Reporting Period 10/30/23 to 12/07/23  See note below for Objective Data and Assessment of Progress/Goals.    END OF SESSION:  OT End of Session - 12/07/23 0857     Visit Number 6    Number of Visits 14    Date for OT Re-Evaluation 01/08/24    Authorization Type [employer], $20 copay    Authorization Time Period no auth required    Progress Note Due on Visit 10    OT Start Time 0809    OT Stop Time 0851    OT Time Calculation (min) 42 min    Activity Tolerance Patient tolerated treatment well    Behavior During Therapy WFL for tasks assessed/performed             Past Medical History:  Diagnosis Date   Arthritis    Cancer (HCC)    prostate ca   seed implants   GERD (gastroesophageal reflux disease)    Glaucoma    Hyperlipidemia    Hypertension    dr Modesto Charon     [facility]   fm   Stroke [facility]) 07/2023   Past Surgical History:  Procedure Laterality Date   BACK SURGERY     x2   COLONOSCOPY     HERNIA REPAIR  11-06-2009   left inguinal repair    NECK SURGERY     ROTATOR CUFF REPAIR Left jan 2016   TOTAL HIP ARTHROPLASTY Right 04/20/2020   Procedure: RIGHT TOTAL HIP ARTHROPLASTY ANTERIOR APPROACH;  Surgeon: Tarry Kos, MD;  Location: [facility] OR;  Service: Orthopedics;  Laterality: Right;   TOTAL HIP ARTHROPLASTY Left 01/07/2021   Procedure: LEFT TOTAL HIP ARTHROPLASTY ANTERIOR APPROACH;  Surgeon: Tarry Kos, MD;  Location: [facility] OR;  Service: Orthopedics;  Laterality: Left;   TRANSURETHRAL RESECTION OF PROSTATE     Patient Active Problem List   Diagnosis Date Noted   High risk medication use 09/18/2023   Vitamin D deficiency 09/18/2023   Supplemental oxygen dependent 09/18/2023   Acute HFrEF (heart failure with reduced ejection fraction) (HCC) 09/08/2023   PAF  (paroxysmal atrial fibrillation) (HCC) 09/07/2023   Acute respiratory failure with hypoxia (HCC) 09/05/2023   Acute heart failure with preserved ejection fraction (HFpEF) (HCC) 09/05/2023   Lobar pneumonia (HCC) 09/05/2023   Cerebrovascular accident (CVA) due to embolism of left middle cerebral artery (HCC) 08/10/2023   Paresthesia of both feet 07/28/2023   Gastroesophageal reflux disease with esophagitis without hemorrhage 09/10/2021   Primary insomnia 09/10/2021   Vasculogenic erectile dysfunction 09/10/2021   Status post total replacement of left hip 01/07/2021   Primary osteoarthritis of left hip 01/06/2021   Leg cramps 12/11/2020   Thyromegaly 05/19/2017   Neoplasm of scalp 12/08/2013   Rhinitis 12/08/2013   Cough 12/08/2013   Hyperlipidemia    Cancer (HCC)    Arthritis    Severe stage glaucoma 03/26/2012   Nuclear sclerosis 03/26/2012   Primary open angle glaucoma 10/27/2011   Essential hypertension 02/04/2011   Degenerative disc disease 02/04/2011   Gastrointestinal bleed 02/04/2011   Anemia 02/04/2011   Prostate cancer (HCC) 02/04/2011   PCP: Dr. [name] REFERRING PROVIDER: Dr. [name]  ONSET DATE: 08/07/23  REFERRING DIAG: L24.401,U27.2 (ICD-10-CM) - Weakness as late effect of cerebrovascular accident (CVA)   THERAPY DIAG:  Other lack of coordination  Other symptoms and signs  involving the nervous system  Rationale for Evaluation and Treatment: Rehabilitation  SUBJECTIVE:   SUBJECTIVE STATEMENT: S: I had a lot of fluid on my legs yesterday  Pt accompanied by: self  PERTINENT HISTORY: Pt is an 83 y/o male who reports he had a stroke on September 19th. From [facility]. Left sided CVA impacting the R side.   PRECAUTIONS: Fall  WEIGHT BEARING RESTRICTIONS: No  PAIN:  Are you having pain? No  FALLS: Has patient fallen in last 6 months? No  PLOF: Independent  PATIENT GOALS: To get stronger and resume independence in ADLs.   OBJECTIVE:   Note: Objective measures were completed at Evaluation unless otherwise noted.  HAND DOMINANCE: Right  ADLs: Overall ADLs: Pt reports difficulty with gripping items, pt is unable to use a toothbrush or razor, is using LUE for ADLs. Pt is unable to tie his shoes, has not tried to hold a pencil. Using left hand to feed himself.  Pt enjoys golfing, fishing, and yardwork.    FUNCTIONAL OUTCOME MEASURES: FOTO: 78/100   UPPER EXTREMITY ROM:      A/ROM of BUE is [facility]  UPPER EXTREMITY MMT:     MMT Right eval Right 12/07/23  Shoulder flexion 5/5 5/5  Shoulder abduction 4/5 (same as LUE) 4+/5  Shoulder internal rotation 5/5 5/5  Shoulder external rotation 5/5 5/5  Elbow flexion 5/5 5/5  Elbow extension 5/5 5/5  Wrist flexion 4/5 5/5  Wrist extension 3+/5 4/5  Wrist ulnar deviation 3+/5 5/5  Wrist radial deviation 3+/5 4+/5  Wrist pronation 5/5 5/5  Wrist supination 5/5 5/5  (Blank rows = not tested)  HAND FUNCTION: Grip strength: Right: 22 lbs; Left: 110 lbs, Lateral pinch: Right: 4 lbs, Left: 26 lbs, and 3 point pinch: Right: unable lbs, Left: 17 lbs 12/07/23: Grip strength: Right: 41 lbs; Lateral pinch: Right: 8 lbs, and 3 point pinch: Right: 6 lbs  COORDINATION: 9 Hole Peg test: Right: 1'48" with set-up sec; Left: 29.94 sec 12/07/23: 9 Hole Peg test: Right: 7'44" sec - on no slip mat  SENSATION: WFL  EDEMA: mild edema in hand  COGNITION: Overall cognitive status: Within functional limits for tasks assessed  VISION: Subjective report: No change Baseline vision: No visual deficits Visual history: glaucoma   TODAY'S TREATMENT:                                                                                                                              DATE:   12/07/23 -Shoulder A/ROM: flexion, abduction, protraction, horizontal abduction, er/IR, x10 -Wrist ROM: flexion, extension, ulnar/radial deviation, supination/pronation, x10 -Digit ROM: composite flexion,  abduction/adduction, finger taps, opposition, x10 -9 Hole Peg test -Red theraputty: 10 beads, pinch and pulling/flattening putty to find beads -Measurements for reassessment  11/16/23 -Wrist Strengthening: 3#, flexion, extension, ulnar/radial deviation, supination/pronation, x12 - Digit ROM: composite fist, finger opposition to thumb (unable to do pinky at this time) 5x each - Grip strength: squeezing  black grip strength tool 20x - UE strength: shoulder press, shoulder flexion, curls 10x each 3lb dumb bells - Pincer grasp: stacking 4 block tower 4x with little foam blocks, playing connect four picking up coins off of table and intermittent min assist to place into game board  - Pinch strength: yellow and green clips on and off tree    11/13/23 -Wrist Strengthening: 3#, flexion, extension, ulnar/radial deviation, supination/pronation, x12 - Digit ROM: composite fist, finger opposition to thumb (unable to do pinky at this time) 5x each  - Pinch strength: yellow and green clips with thumb and index finger  -Yellow putty: Roll into a ball, flatten into a pancake, roll into a log, tripod pinch, lateral pinch, roll into a ball, full squeeze x10 - Pincer grasp: picking up large pegs and using palm to pres into board 20x, slotting coins 20x with L hand    PATIENT EDUCATION: Education details: Wrist and Digit ROM Person educated: Patient Education method: Programmer, multimedia, Demonstration, and Handouts Education comprehension: verbalized understanding and returned demonstration  HOME EXERCISE PROGRAM: Eval: yellow theraputty-grip and pinch strengthening 12/17: Wrist and digit ROM 12/27: pinch activities    GOALS: Goals reviewed with patient? Yes  SHORT TERM GOALS: Target date: 11/27/23  Pt will be provided with and educated on HEP to improve mobility and use of RUE required for ADL completion.   Goal status: IN PROGRESS  2.  Pt will increase RUE strength to 4+/5 to improve ability to perform  yardwork and fishing activities using RUE as dominant.   Goal status: IN PROGRESS  3.  Pt will increase right grip strength by 30+ pounds and pinch strength by 10+ pounds to improve ability to grip and utilize tools such as a toothbrush or razor with the right hand.   Goal status: UPGRADE  4.  Pt will improve RUE coordination required for tying shoes and operate fishing gear by completing 9 hole peg test in under 1'.   Goal status: IN PROGRESS    ASSESSMENT:  CLINICAL IMPRESSION: This session pt completed reassessment for recertification. He is demonstrating good improvement in overall RUE strength, as well as grip and pinch strength. Due to limited sensation and dexterity, his fine motor coordination continues to be very limited. OT providing verbal and tactile cuing provided for positioning and technique throughout session. Recommending continued skilled OT for 2x per week for 4 more weeks.   PERFORMANCE DEFICITS: in functional skills including ADLs, IADLs, coordination, dexterity, edema, tone, ROM, strength, and UE functional use   PLAN:  OT FREQUENCY: 2x/week  OT DURATION: 4 weeks  PLANNED INTERVENTIONS: 97168 OT Re-evaluation, 97535 self care/ADL training, 16109 therapeutic exercise, 97530 therapeutic activity, 97112 neuromuscular re-education, 97140 manual therapy, 97018 paraffin, 60454 electrical stimulation (manual), patient/family education, and DME and/or AE instructions  RECOMMENDED OTHER SERVICES: PT evaluation  CONSULTED AND AGREED WITH PLAN OF CARE: Patient  PLAN FOR NEXT SESSION: follow up on HEP, wrist and grip strengthening, coordination work   [employer], OTR/L [phone] 12/07/2023, 8:58 AM

## 2023-12-07 NOTE — Therapy (Signed)
OUTPATIENT SPEECH LANGUAGE PATHOLOGY TREATMENT   Patient Name: Joshua Compton. MRN: [id number] DOB:Oct 18, 1941, 83 y.o., male 57 Date: 12/07/2023  PCP: [name], MD REFERRING PROVIDER: [name], MD  END OF SESSION:  End of Session - 12/07/23 0951     Visit Number 2    Number of Visits 5    Date for SLP Re-Evaluation 01/14/24    Authorization Type [employer]   PER AVAILTY (AETNA) EFF 11/17/20 DED MET $4500 OOP ($3,473.00) $20.00 COPAY NO AUTH REQ   SLP Start Time 0850    SLP Stop Time  0935    SLP Time Calculation (min) 45 min    Activity Tolerance Patient tolerated treatment well             Past Medical History:  Diagnosis Date   Arthritis    Cancer (HCC)    prostate ca   seed implants   GERD (gastroesophageal reflux disease)    Glaucoma    Hyperlipidemia    Hypertension    dr [place] Charon     [facility]   fm   Stroke [facility]) 07/2023   Past Surgical History:  Procedure Laterality Date   BACK SURGERY     x2   COLONOSCOPY     HERNIA REPAIR  11-06-2009   left inguinal repair    NECK SURGERY     ROTATOR CUFF REPAIR Left jan 2016   TOTAL HIP ARTHROPLASTY Right 04/20/2020   Procedure: RIGHT TOTAL HIP ARTHROPLASTY ANTERIOR APPROACH;  Surgeon: Tarry Kos, MD;  Location: [facility] OR;  Service: Orthopedics;  Laterality: Right;   TOTAL HIP ARTHROPLASTY Left 01/07/2021   Procedure: LEFT TOTAL HIP ARTHROPLASTY ANTERIOR APPROACH;  Surgeon: Tarry Kos, MD;  Location: [facility] OR;  Service: Orthopedics;  Laterality: Left;   TRANSURETHRAL RESECTION OF PROSTATE     Patient Active Problem List   Diagnosis Date Noted   High risk medication use 09/18/2023   Vitamin D deficiency 09/18/2023   Supplemental oxygen dependent 09/18/2023   Acute HFrEF (heart failure with reduced ejection fraction) (HCC) 09/08/2023   PAF (paroxysmal atrial fibrillation) (HCC) 09/07/2023   Acute respiratory failure with hypoxia (HCC) 09/05/2023   Acute heart failure with preserved  ejection fraction (HFpEF) (HCC) 09/05/2023   Lobar pneumonia (HCC) 09/05/2023   Cerebrovascular accident (CVA) due to embolism of left middle cerebral artery (HCC) 08/10/2023   Paresthesia of both feet 07/28/2023   Gastroesophageal reflux disease with esophagitis without hemorrhage 09/10/2021   Primary insomnia 09/10/2021   Vasculogenic erectile dysfunction 09/10/2021   Status post total replacement of left hip 01/07/2021   Primary osteoarthritis of left hip 01/06/2021   Leg cramps 12/11/2020   Thyromegaly 05/19/2017   Neoplasm of scalp 12/08/2013   Rhinitis 12/08/2013   Cough 12/08/2013   Hyperlipidemia    Cancer (HCC)    Arthritis    Severe stage glaucoma 03/26/2012   Nuclear sclerosis 03/26/2012   Primary open angle glaucoma 10/27/2011   Essential hypertension 02/04/2011   Degenerative disc disease 02/04/2011   Gastrointestinal bleed 02/04/2011   Anemia 02/04/2011   Prostate cancer (HCC) 02/04/2011    ONSET DATE: 08/07/2023  REFERRING DIAG: R29.810 (ICD-10-CM) - Weakness on right side of face I69.390 (ICD-10-CM) - 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 The patient was admitted to the hospital from 08/07/2023 to 08/14/2023. He was transferred to [facility] from [facility] for right-sided weakness and aphasia. The patient was found to have an acute left frontal lobe infarct that was felt to be cryptogenic. CTA of the head and neck was negative for LVO. Echocardiogram showed EF 50-55%, no WMA, normal diastolic function, normal RV function, trace MR/TR, mild AI, no AS. He was discharged to inpatient rehab where he stayed from 08/14/2023 to 08/24/2023. He  was discharged with aspirin 81 mg daily and Lipitor 40 mg daily for secondary prophylaxis. He has since returned home to live with his daughter and spouse.  PAIN:  Are you having pain? No  FALLS: Has patient fallen in last 6 months?  No  LIVING ENVIRONMENT: Lives with: lives with their family and lives with their spouse Lives in: House/apartment  PLOF:  Level of assistance: Independent with ADLs, Independent with IADLs Employment: Retired  PATIENT GOALS: Improve voice, speech, and his right hand  OBJECTIVE:  Note: Objective measures were completed at Evaluation unless otherwise noted.  DIAGNOSTIC FINDINGS:  MRI Head WO Contrast 08/07/23 1. Acute to subacute infarction within the posterior aspect of the left frontal lobe. Small amount of associated petechial hemorrhage.   PATIENT EDUCATION: Education details: dysarthria treatment strategies-over articulate, reduce rate, swallow 2x/minute, and avoid clearing throat, words to practice provided Person educated: Patient Education method: Explanation, Demonstration, and Handouts Education comprehension: verbalized understanding  TODAY'S TREATMENT:  Pt has not attended SLP treatment since his initial evaluation on 11/04/2023. He did not bring his folder in that was given during his evaluation with speech intelligibility strategies. SLP reviewed goals and asked Pt to comment on how he feels his speech has been in the past month. He indicated that people still need to request repetition or clarification at times (at [facility] and when talking with his wife who has mild dementia). Pt was noted to clear his throat ~6 times in the first few minutes of therapy and he was reminded to replace throat clearing with a swallow or sip of water. Throat clearing significantly diminished throughout the session. Speech intelligibility strategies were reviewed (reduce rate, over articulate, increase volume) and Pt asked to implement in structured tasks (orally  reading multi-syllabic words and creating sentences with each word). He required initial reminder to use strategies and use effort and intent during structured task. He needed mi/mod cues to implement during personally relevant conversation. He was given a list of frequently used words/phrases (address, physician, family member names, etc) to practice at home. Recommend another 3-4 sessions once per week to address the goals stated above. Goals have not changed, as Pt has not been seen since his evaluation. DATE: 12/07/23  GOALS: Goals reviewed with patient? Yes  SHORT TERM GOALS: Target date: 01/13/2024  Pt will implement fluency enhancing and speech intelligibility strategies when generating sentences involving multisyllabic words with 90% acc and min assist. Baseline: mi/mod assist Goal status: ONGOING  2.  Pt will utilize speech intelligibility strategies (over articulation, reduced speaking rate, and vocal intensity) at the conversation level with 90% acc and min cues. Baseline: ~75% intelligible Goal status: ONGOING  3.  Pt will Decrease chronic cough by 80% via reduction in laryngeal hypersensitivity. a. Sip liquid instead of coughing  b. Follow reflux recommendations (if applicable) c. Increase hydration to a minimum of 48+oz of water d. Improve breath support during speech (not  speaking to end of breath) Baseline: Pt cleared throat frequently throughout the evaluation and reports taking reflux medication with breakfast Goal status: ONGOING  LONG TERM GOALS: Same as short term goals  ASSESSMENT:  CLINICAL IMPRESSION: Patient is an 83 y.o. male who was seen today for a cognitive linguistic evaluation s/p CVA 08/07/2023.  Pt lives with his spouse who has mild dementia. He was independent with managing finances and medications. Patient presents with a mild dysarthria c/b decreased speech intelligibility, reduced articulatory precision, and low volume speech. Patient was ~75%  intelligible in conversation. Basic expressive and receptive language skills appear WFL. Confrontation naming, repetition, and automatic speech tasks were completed with 100% accuracy. Two step directives were followed independently. Basic yes/no questions were answered with 100% accuracy. Pt scored a 24/30 on the [facility] SLUMS with deficits in mental calculation, story recall, and attention. Pt is motivated to improve his speech intelligibility and resume previously enjoyed activities (golfing, fishing, working on his car, gardening, and Microbiologist). He has excellent family support.  OBJECTIVE IMPAIRMENTS: include dysarthria. These impairments are limiting patient from effectively communicating at home and in community. Factors affecting potential to achieve goals and functional outcome are  consideration of GERD impacting voice . Patient will benefit from skilled SLP services to address above impairments and improve overall function.  REHAB POTENTIAL: Excellent  PLAN:  SLP FREQUENCY: 1x/week  SLP DURATION: 4 weeks  PLANNED INTERVENTIONS: [phone] Artic, Phon, Eval Compre, Express, [phone] Treatment of speech (30 or 45 min) , Cueing hierachy, Oral motor exercises, SLP instruction and feedback, Compensatory strategies, and Patient/family education   Thank you,  Havery Moros, CCC-SLP [phone]  Nicholus Chandran, CCC-SLP 12/07/2023, 9:55 AM

## 2023-12-10 ENCOUNTER — Other Ambulatory Visit: Payer: PPO

## 2023-12-10 DIAGNOSIS — N289 Disorder of kidney and ureter, unspecified: Secondary | ICD-10-CM

## 2023-12-10 LAB — BASIC METABOLIC PANEL
BUN/Creatinine Ratio: 12 (ref 10–24)
BUN: 18 mg/dL (ref 8–27)
CO2: 25 mmol/L (ref 20–29)
Calcium: 9.4 mg/dL (ref 8.6–10.2)
Chloride: 104 mmol/L (ref 96–106)
Creatinine, Ser: 1.56 mg/dL — ABNORMAL HIGH (ref 0.76–1.27)
Glucose: 90 mg/dL (ref 70–99)
Potassium: 3.9 mmol/L (ref 3.5–5.2)
Sodium: 145 mmol/L — ABNORMAL HIGH (ref 134–144)
eGFR: 44 mL/min/{1.73_m2} — ABNORMAL LOW (ref >59)

## 2023-12-11 ENCOUNTER — Ambulatory Visit (HOSPITAL_COMMUNITY): Payer: PPO | Admitting: Occupational Therapy

## 2023-12-11 ENCOUNTER — Encounter: Payer: Self-pay | Admitting: Family Medicine

## 2023-12-11 ENCOUNTER — Encounter (HOSPITAL_COMMUNITY): Payer: Self-pay | Admitting: Occupational Therapy

## 2023-12-11 DIAGNOSIS — R278 Other lack of coordination: Secondary | ICD-10-CM

## 2023-12-11 DIAGNOSIS — R471 Dysarthria and anarthria: Secondary | ICD-10-CM | POA: Diagnosis not present

## 2023-12-11 DIAGNOSIS — R29818 Other symptoms and signs involving the nervous system: Secondary | ICD-10-CM

## 2023-12-11 NOTE — Therapy (Signed)
OUTPATIENT OCCUPATIONAL THERAPY NEURO TREATMENT NOTE  Patient Name: Joshua Compton. MRN: [id number] DOB:[birth year], 83 y.o., male Today's Date: 12/11/2023   END OF SESSION:  OT End of Session - 12/11/23 1157     Visit Number 7    Number of Visits 14    Date for OT Re-Evaluation 01/08/24    Authorization Type [employer], $20 copay    Authorization Time Period no auth required    Progress Note Due on Visit 10    OT Start Time [phone]    OT Stop Time 1018    OT Time Calculation (min) 40 min    Activity Tolerance Patient tolerated treatment well    Behavior During Therapy WFL for tasks assessed/performed              Past Medical History:  Diagnosis Date   Arthritis    Cancer (HCC)    prostate ca   seed implants   GERD (gastroesophageal reflux disease)    Glaucoma    Hyperlipidemia    Hypertension    dr [place] Charon     [facility]   fm   Stroke [facility]) 07/2023   Past Surgical History:  Procedure Laterality Date   BACK SURGERY     x2   COLONOSCOPY     HERNIA REPAIR  11-06-2009   left inguinal repair    NECK SURGERY     ROTATOR CUFF REPAIR Left jan 2016   TOTAL HIP ARTHROPLASTY Right 04/20/2020   Procedure: RIGHT TOTAL HIP ARTHROPLASTY ANTERIOR APPROACH;  Surgeon: Tarry Kos, MD;  Location: [facility] OR;  Service: Orthopedics;  Laterality: Right;   TOTAL HIP ARTHROPLASTY Left 01/07/2021   Procedure: LEFT TOTAL HIP ARTHROPLASTY ANTERIOR APPROACH;  Surgeon: Tarry Kos, MD;  Location: [facility] OR;  Service: Orthopedics;  Laterality: Left;   TRANSURETHRAL RESECTION OF PROSTATE     Patient Active Problem List   Diagnosis Date Noted   High risk medication use 09/18/2023   Vitamin D deficiency 09/18/2023   Supplemental oxygen dependent 09/18/2023   Acute HFrEF (heart failure with reduced ejection fraction) (HCC) 09/08/2023   PAF (paroxysmal atrial fibrillation) (HCC) 09/07/2023   Acute respiratory failure with hypoxia (HCC) 09/05/2023   Acute heart failure with preserved  ejection fraction (HFpEF) (HCC) 09/05/2023   Lobar pneumonia (HCC) 09/05/2023   Cerebrovascular accident (CVA) due to embolism of left middle cerebral artery (HCC) 08/10/2023   Paresthesia of both feet 07/28/2023   Gastroesophageal reflux disease with esophagitis without hemorrhage 09/10/2021   Primary insomnia 09/10/2021   Vasculogenic erectile dysfunction 09/10/2021   Status post total replacement of left hip 01/07/2021   Primary osteoarthritis of left hip 01/06/2021   Leg cramps 12/11/2020   Thyromegaly 05/19/2017   Neoplasm of scalp 12/08/2013   Rhinitis 12/08/2013   Cough 12/08/2013   Hyperlipidemia    Cancer (HCC)    Arthritis    Severe stage glaucoma 03/26/2012   Nuclear sclerosis 03/26/2012   Primary open angle glaucoma 10/27/2011   Essential hypertension 02/04/2011   Degenerative disc disease 02/04/2011   Gastrointestinal bleed 02/04/2011   Anemia 02/04/2011   Prostate cancer (HCC) 02/04/2011   PCP: Dr. [name] REFERRING PROVIDER: Dr. [name]  ONSET DATE: 08/07/23  REFERRING DIAG: I69.998,R53.1 (ICD-10-CM) - Weakness as late effect of cerebrovascular accident (CVA)   THERAPY DIAG:  Other lack of coordination  Other symptoms and signs involving the nervous system  Rationale for Evaluation and Treatment: Rehabilitation  SUBJECTIVE:   SUBJECTIVE STATEMENT: S: I just  want the swelling to go away Pt accompanied by: self  PERTINENT HISTORY: Pt is an 83 y/o male who reports he had a stroke on September 19th. From [facility]. Left sided CVA impacting the R side.   PRECAUTIONS: Fall  WEIGHT BEARING RESTRICTIONS: No  PAIN:  Are you having pain? No  FALLS: Has patient fallen in last 6 months? No  PLOF: Independent  PATIENT GOALS: To get stronger and resume independence in ADLs.   OBJECTIVE:  Note: Objective measures were completed at Evaluation unless otherwise noted.  HAND DOMINANCE: Right  ADLs: Overall ADLs: Pt reports difficulty with  gripping items, pt is unable to use a toothbrush or razor, is using LUE for ADLs. Pt is unable to tie his shoes, has not tried to hold a pencil. Using left hand to feed himself.  Pt enjoys golfing, fishing, and yardwork.    FUNCTIONAL OUTCOME MEASURES: FOTO: 78/100   UPPER EXTREMITY ROM:      A/ROM of BUE is [facility]  UPPER EXTREMITY MMT:     MMT Right eval Right 12/07/23  Shoulder flexion 5/5 5/5  Shoulder abduction 4/5 (same as LUE) 4+/5  Shoulder internal rotation 5/5 5/5  Shoulder external rotation 5/5 5/5  Elbow flexion 5/5 5/5  Elbow extension 5/5 5/5  Wrist flexion 4/5 5/5  Wrist extension 3+/5 4/5  Wrist ulnar deviation 3+/5 5/5  Wrist radial deviation 3+/5 4+/5  Wrist pronation 5/5 5/5  Wrist supination 5/5 5/5  (Blank rows = not tested)  HAND FUNCTION: Grip strength: Right: 22 lbs; Left: 110 lbs, Lateral pinch: Right: 4 lbs, Left: 26 lbs, and 3 point pinch: Right: unable lbs, Left: 17 lbs 12/07/23: Grip strength: Right: 41 lbs; Lateral pinch: Right: 8 lbs, and 3 point pinch: Right: 6 lbs  COORDINATION: 9 Hole Peg test: Right: 1'48" with set-up sec; Left: 29.94 sec 12/07/23: 9 Hole Peg test: Right: 7'44" sec - on no slip mat  SENSATION: WFL  EDEMA: mild edema in hand  COGNITION: Overall cognitive status: Within functional limits for tasks assessed  VISION: Subjective report: No change Baseline vision: No visual deficits Visual history: glaucoma   TODAY'S TREATMENT:                                                                                                                              DATE:   12/11/23 -Theraputty: red putty, roll into a ball, flatten into a pancake, roll into a log, tripod pinch x10, lateral pinch x10, roll into ball, squeeze x10 -Digit ROM: composite flexion, abduction/adduction, finger taps, opposition, x10 -Digit flexion/extension isolation, each digit x10 -Pinch tree: red, green, blue resistance clips x7 each, up with tripod  pinch/down with lateral pinch -Wrist ROM: flexion, extension, ulnar/radial deviation, supination/pronation, x10  12/07/23 -Shoulder A/ROM: flexion, abduction, protraction, horizontal abduction, er/IR, x10 -Wrist ROM: flexion, extension, ulnar/radial deviation, supination/pronation, x10 -Digit ROM: composite flexion, abduction/adduction, finger taps, opposition, x10 -9 Hole Peg test -Red theraputty: 10  beads, pinch and pulling/flattening putty to find beads -Measurements for reassessment  11/16/23 -Wrist Strengthening: 3#, flexion, extension, ulnar/radial deviation, supination/pronation, x12 - Digit ROM: composite fist, finger opposition to thumb (unable to do pinky at this time) 5x each - Grip strength: squeezing black grip strength tool 20x - UE strength: shoulder press, shoulder flexion, curls 10x each 3lb dumb bells - Pincer grasp: stacking 4 block tower 4x with little foam blocks, playing connect four picking up coins off of table and intermittent min assist to place into game board  - Pinch strength: yellow and green clips on and off tree     PATIENT EDUCATION: Education details: Production manager Person educated: Patient Education method: Explanation, Demonstration, and Handouts Education comprehension: verbalized understanding and returned demonstration  HOME EXERCISE PROGRAM: Eval: yellow theraputty-grip and pinch strengthening 12/17: Wrist and digit ROM 12/27: pinch activities  1/24: Digit Isolation   GOALS: Goals reviewed with patient? Yes  SHORT TERM GOALS: Target date: 11/27/23  Pt will be provided with and educated on HEP to improve mobility and use of RUE required for ADL completion.   Goal status: IN PROGRESS  2.  Pt will increase RUE strength to 4+/5 to improve ability to perform yardwork and fishing activities using RUE as dominant.   Goal status: IN PROGRESS  3.  Pt will increase right grip strength by 30+ pounds and pinch strength by 10+ pounds to improve  ability to grip and utilize tools such as a toothbrush or razor with the right hand.   Goal status: UPGRADE  4.  Pt will improve RUE coordination required for tying shoes and operate fishing gear by completing 9 hole peg test in under 1'.   Goal status: IN PROGRESS    ASSESSMENT:  CLINICAL IMPRESSION: This session pt focusing on hand mobility and gripping/pinching. He continues to have difficulty with digit isolation and coordination. When working on both theraputty and pinch, he is unable to complete tripod pinches due to poor sensation and isolated movements. OT adding digit isolation from flexion to extension with each digit this session, providing mod support to keep other fingers in flexion. Verbal and tactile cuing providing for positioning and technique throughout session.   PERFORMANCE DEFICITS: in functional skills including ADLs, IADLs, coordination, dexterity, edema, tone, ROM, strength, and UE functional use   PLAN:  OT FREQUENCY: 2x/week  OT DURATION: 4 weeks  PLANNED INTERVENTIONS: 97168 OT Re-evaluation, 97535 self care/ADL training, 82956 therapeutic exercise, 97530 therapeutic activity, 97112 neuromuscular re-education, 97140 manual therapy, 97018 paraffin, 21308 electrical stimulation (manual), patient/family education, and DME and/or AE instructions  RECOMMENDED OTHER SERVICES: PT evaluation  CONSULTED AND AGREED WITH PLAN OF CARE: Patient  PLAN FOR NEXT SESSION: follow up on HEP, wrist and grip strengthening, coordination work   [employer], OTR/L [phone] 12/11/2023, 11:58 AM

## 2023-12-11 NOTE — Progress Notes (Signed)
Hello [place],  Your lab result is normal and/or stable.Some minor variations that are not significant are commonly marked abnormal, but do not represent any medical problem for you.  Best regards, [name], M.D.

## 2023-12-12 ENCOUNTER — Other Ambulatory Visit: Payer: Self-pay | Admitting: Family Medicine

## 2023-12-12 DIAGNOSIS — J189 Pneumonia, unspecified organism: Secondary | ICD-10-CM

## 2023-12-14 ENCOUNTER — Encounter (HOSPITAL_COMMUNITY): Payer: Self-pay | Admitting: Occupational Therapy

## 2023-12-14 ENCOUNTER — Ambulatory Visit (HOSPITAL_COMMUNITY): Payer: PPO | Admitting: Occupational Therapy

## 2023-12-14 DIAGNOSIS — R29818 Other symptoms and signs involving the nervous system: Secondary | ICD-10-CM

## 2023-12-14 DIAGNOSIS — R471 Dysarthria and anarthria: Secondary | ICD-10-CM | POA: Diagnosis not present

## 2023-12-14 DIAGNOSIS — R278 Other lack of coordination: Secondary | ICD-10-CM

## 2023-12-14 NOTE — Therapy (Signed)
OUTPATIENT OCCUPATIONAL THERAPY NEURO TREATMENT NOTE  Patient Name: Joshua Compton. MRN: [id number] DOB:06-11-1941, 83 y.o., male Today's Date: 12/14/2023   END OF SESSION:  OT End of Session - 12/14/23 1045     Visit Number 8    Number of Visits 14    Date for OT Re-Evaluation 01/08/24    Authorization Type [employer], $20 copay    Authorization Time Period no auth required    Progress Note Due on Visit 10    OT Start Time 1022    OT Stop Time 1100    OT Time Calculation (min) 38 min    Activity Tolerance Patient tolerated treatment well    Behavior During Therapy WFL for tasks assessed/performed               Past Medical History:  Diagnosis Date   Arthritis    Cancer (HCC)    prostate ca   seed implants   GERD (gastroesophageal reflux disease)    Glaucoma    Hyperlipidemia    Hypertension    dr Modesto Charon     [facility]   fm   Stroke [facility]) 07/2023   Past Surgical History:  Procedure Laterality Date   BACK SURGERY     x2   COLONOSCOPY     HERNIA REPAIR  11-06-2009   left inguinal repair    NECK SURGERY     ROTATOR CUFF REPAIR Left jan 2016   TOTAL HIP ARTHROPLASTY Right 04/20/2020   Procedure: RIGHT TOTAL HIP ARTHROPLASTY ANTERIOR APPROACH;  Surgeon: Tarry Kos, MD;  Location: [facility] OR;  Service: Orthopedics;  Laterality: Right;   TOTAL HIP ARTHROPLASTY Left 01/07/2021   Procedure: LEFT TOTAL HIP ARTHROPLASTY ANTERIOR APPROACH;  Surgeon: Tarry Kos, MD;  Location: [facility] OR;  Service: Orthopedics;  Laterality: Left;   TRANSURETHRAL RESECTION OF PROSTATE     Patient Active Problem List   Diagnosis Date Noted   High risk medication use 09/18/2023   Vitamin D deficiency 09/18/2023   Supplemental oxygen dependent 09/18/2023   Acute HFrEF (heart failure with reduced ejection fraction) (HCC) 09/08/2023   PAF (paroxysmal atrial fibrillation) (HCC) 09/07/2023   Acute respiratory failure with hypoxia (HCC) 09/05/2023   Acute heart failure with preserved  ejection fraction (HFpEF) (HCC) 09/05/2023   Lobar pneumonia (HCC) 09/05/2023   Cerebrovascular accident (CVA) due to embolism of left middle cerebral artery (HCC) 08/10/2023   Paresthesia of both feet 07/28/2023   Gastroesophageal reflux disease with esophagitis without hemorrhage 09/10/2021   Primary insomnia 09/10/2021   Vasculogenic erectile dysfunction 09/10/2021   Status post total replacement of left hip 01/07/2021   Primary osteoarthritis of left hip 01/06/2021   Leg cramps 12/11/2020   Thyromegaly 05/19/2017   Neoplasm of scalp 12/08/2013   Rhinitis 12/08/2013   Cough 12/08/2013   Hyperlipidemia    Cancer (HCC)    Arthritis    Severe stage glaucoma 03/26/2012   Nuclear sclerosis 03/26/2012   Primary open angle glaucoma 10/27/2011   Essential hypertension 02/04/2011   Degenerative disc disease 02/04/2011   Gastrointestinal bleed 02/04/2011   Anemia 02/04/2011   Prostate cancer (HCC) 02/04/2011   PCP: Dr. [name] REFERRING PROVIDER: Dr. [name]  ONSET DATE: 08/07/23  REFERRING DIAG: I69.998,R53.1 (ICD-10-CM) - Weakness as late effect of cerebrovascular accident (CVA)   THERAPY DIAG:  Other lack of coordination  Other symptoms and signs involving the nervous system  Rationale for Evaluation and Treatment: Rehabilitation  SUBJECTIVE:   SUBJECTIVE STATEMENT: S: I  just want the swelling to go away Pt accompanied by: self  PERTINENT HISTORY: Pt is an 83 y/o male who reports he had a stroke on September 19th. From [facility]. Left sided CVA impacting the R side.   PRECAUTIONS: Fall  WEIGHT BEARING RESTRICTIONS: No  PAIN:  Are you having pain? No  FALLS: Has patient fallen in last 6 months? No  PLOF: Independent  PATIENT GOALS: To get stronger and resume independence in ADLs.   OBJECTIVE:  Note: Objective measures were completed at Evaluation unless otherwise noted.  HAND DOMINANCE: Right  ADLs: Overall ADLs: Pt reports difficulty with  gripping items, pt is unable to use a toothbrush or razor, is using LUE for ADLs. Pt is unable to tie his shoes, has not tried to hold a pencil. Using left hand to feed himself.  Pt enjoys golfing, fishing, and yardwork.    FUNCTIONAL OUTCOME MEASURES: FOTO: 78/100   UPPER EXTREMITY ROM:      A/ROM of BUE is [facility]  UPPER EXTREMITY MMT:     MMT Right eval Right 12/07/23  Shoulder flexion 5/5 5/5  Shoulder abduction 4/5 (same as LUE) 4+/5  Shoulder internal rotation 5/5 5/5  Shoulder external rotation 5/5 5/5  Elbow flexion 5/5 5/5  Elbow extension 5/5 5/5  Wrist flexion 4/5 5/5  Wrist extension 3+/5 4/5  Wrist ulnar deviation 3+/5 5/5  Wrist radial deviation 3+/5 4+/5  Wrist pronation 5/5 5/5  Wrist supination 5/5 5/5  (Blank rows = not tested)  HAND FUNCTION: Grip strength: Right: 22 lbs; Left: 110 lbs, Lateral pinch: Right: 4 lbs, Left: 26 lbs, and 3 point pinch: Right: unable lbs, Left: 17 lbs 12/07/23: Grip strength: Right: 41 lbs; Lateral pinch: Right: 8 lbs, and 3 point pinch: Right: 6 lbs  COORDINATION: 9 Hole Peg test: Right: 1'48" with set-up sec; Left: 29.94 sec 12/07/23: 9 Hole Peg test: Right: 7'44" sec - on no slip mat  EDEMA: mild edema in hand  TODAY'S TREATMENT:                                                                                                                              DATE:  12/14/23 -Wrist strengthening: 3#, extension, 15 reps -Grip strengthening: hand gripper-large beads with gripper vertical for 3, horizontal for 2; 5 medium beads with gripper horizontal at 25#  -Red theraputty: flatten, cutting circles into putty using PVC pipe working on sustained grip with wrist flexion/extension -Chip manipulation: pt hold sequence chips in his palm, working on translating to fingertips to drop into container. Pt with max difficulty using digits and thumb versus compensating with shaking. Increased time and consistent verbal cuing, dropping chips each  attempt -Sponges: 4, 5, 4  12/11/23 -Theraputty: red putty, roll into a ball, flatten into a pancake, roll into a log, tripod pinch x10, lateral pinch x10, roll into ball, squeeze x10 -Digit ROM: composite flexion, abduction/adduction, finger taps, opposition, x10 -Digit flexion/extension isolation,  each digit x10 -Pinch tree: red, green, blue resistance clips x7 each, up with tripod pinch/down with lateral pinch -Wrist ROM: flexion, extension, ulnar/radial deviation, supination/pronation, x10  12/07/23 -Shoulder A/ROM: flexion, abduction, protraction, horizontal abduction, er/IR, x10 -Wrist ROM: flexion, extension, ulnar/radial deviation, supination/pronation, x10 -Digit ROM: composite flexion, abduction/adduction, finger taps, opposition, x10 -9 Hole Peg test -Red theraputty: 10 beads, pinch and pulling/flattening putty to find beads -Measurements for reassessment    PATIENT EDUCATION: Education details:  Person educated: Patient Education method: Programmer, multimedia, Facilities manager, and Handouts Education comprehension: verbalized understanding and returned demonstration  HOME EXERCISE PROGRAM: Eval: yellow theraputty-grip and pinch strengthening 12/17: Wrist and digit ROM 12/27: pinch activities  1/24: Digit Isolation   GOALS: Goals reviewed with patient? Yes  SHORT TERM GOALS: Target date: 11/27/23  Pt will be provided with and educated on HEP to improve mobility and use of RUE required for ADL completion.   Goal status: IN PROGRESS  2.  Pt will increase RUE strength to 4+/5 to improve ability to perform yardwork and fishing activities using RUE as dominant.   Goal status: MET  3.  Pt will increase right grip strength by 30+ pounds and pinch strength by 10+ pounds to improve ability to grip and utilize tools such as a toothbrush or razor with the right hand.   Goal status: IN PROGRESS  4.  Pt will improve RUE coordination required for tying shoes and operate fishing gear by  completing 9 hole peg test in under 1'.   Goal status: IN PROGRESS    ASSESSMENT:  CLINICAL IMPRESSION: Pt reports HEP is going well. Session focusing on grip strength and fine motor coordination as pt has met his UE strength goal for 4+/5 or greater. Pt with mod to max difficulty with in hand manipulation work, downgrading to larger sequence chips versus coins. Intermittent rest breaks during session for fatigue. Verbal and tactile cuing providing for positioning and technique throughout session.   PERFORMANCE DEFICITS: in functional skills including ADLs, IADLs, coordination, dexterity, edema, tone, ROM, strength, and UE functional use   PLAN:  OT FREQUENCY: 2x/week  OT DURATION: 4 weeks  PLANNED INTERVENTIONS: 97168 OT Re-evaluation, 97535 self care/ADL training, 62952 therapeutic exercise, 97530 therapeutic activity, 97112 neuromuscular re-education, 97140 manual therapy, 97018 paraffin, 84132 electrical stimulation (manual), patient/family education, and DME and/or AE instructions  RECOMMENDED OTHER SERVICES: PT evaluation  CONSULTED AND AGREED WITH PLAN OF CARE: Patient  PLAN FOR NEXT SESSION: follow up on HEP, wrist and grip strengthening, coordination work   [employer], OTR/L  [phone] 12/14/2023, 11:54 AM

## 2023-12-16 ENCOUNTER — Ambulatory Visit (HOSPITAL_COMMUNITY)
Admission: RE | Admit: 2023-12-16 | Discharge: 2023-12-16 | Disposition: A | Discharge status: Home / Self Care | Payer: PPO | Source: Ambulatory Visit | Attending: Cardiology | Admitting: Cardiology

## 2023-12-16 DIAGNOSIS — I5022 Chronic systolic (congestive) heart failure: Secondary | ICD-10-CM | POA: Insufficient documentation

## 2023-12-16 LAB — ECHOCARDIOGRAM COMPLETE
AR max vel: 2.39 cm2
AV Area VTI: 2.29 cm2
AV Area mean vel: 2.63 cm2
AV Mean grad: 6 mm[Hg]
AV Peak grad: 14.9 mm[Hg]
Ao pk vel: 1.93 m/s
Area-P 1/2: 3.1 cm2
MV M vel: 5.22 m/s
MV Peak grad: 109 mm[Hg]
P 1/2 time: 604 ms
S' Lateral: 4.1 cm

## 2023-12-16 NOTE — Progress Notes (Signed)
*  PRELIMINARY RESULTS* Echocardiogram 2D Echocardiogram has been performed.  Joshua Compton 12/16/2023, 11:13 AM

## 2023-12-17 ENCOUNTER — Encounter (HOSPITAL_COMMUNITY): Payer: PPO | Admitting: Speech Pathology

## 2023-12-18 ENCOUNTER — Ambulatory Visit (HOSPITAL_COMMUNITY): Payer: PPO | Admitting: Occupational Therapy

## 2023-12-18 ENCOUNTER — Encounter (HOSPITAL_COMMUNITY): Payer: Self-pay | Admitting: Occupational Therapy

## 2023-12-18 DIAGNOSIS — R278 Other lack of coordination: Secondary | ICD-10-CM

## 2023-12-18 DIAGNOSIS — R29818 Other symptoms and signs involving the nervous system: Secondary | ICD-10-CM

## 2023-12-18 DIAGNOSIS — R471 Dysarthria and anarthria: Secondary | ICD-10-CM | POA: Diagnosis not present

## 2023-12-18 NOTE — Therapy (Signed)
OUTPATIENT OCCUPATIONAL THERAPY NEURO TREATMENT NOTE  Patient Name: Joshua Compton. MRN: [id number] DOB:[birth year], 83 y.o., male Today's Date: 12/18/2023   END OF SESSION:  OT End of Session - 12/18/23 1427     Visit Number 9    Number of Visits 14    Date for OT Re-Evaluation 01/08/24    Authorization Type [employer] Medicare, $20 copay    Authorization Time Period no auth required    Progress Note Due on Visit 10    OT Start Time 1345    OT Stop Time 1427    OT Time Calculation (min) 42 min    Activity Tolerance Patient tolerated treatment well    Behavior During Therapy WFL for tasks assessed/performed                Past Medical History:  Diagnosis Date   Arthritis    Cancer (HCC)    prostate ca   seed implants   GERD (gastroesophageal reflux disease)    Glaucoma    Hyperlipidemia    Hypertension    dr [place] Charon     [place]   fm   Stroke (HCC) 07/2023   Past Surgical History:  Procedure Laterality Date   BACK SURGERY     x2   COLONOSCOPY     HERNIA REPAIR  11-06-2009   left inguinal repair    NECK SURGERY     ROTATOR CUFF REPAIR Left jan 2016   TOTAL HIP ARTHROPLASTY Right 04/20/2020   Procedure: RIGHT TOTAL HIP ARTHROPLASTY ANTERIOR APPROACH;  Surgeon: Tarry Kos, MD;  Location: [facility] OR;  Service: Orthopedics;  Laterality: Right;   TOTAL HIP ARTHROPLASTY Left 01/07/2021   Procedure: LEFT TOTAL HIP ARTHROPLASTY ANTERIOR APPROACH;  Surgeon: Tarry Kos, MD;  Location: [facility] OR;  Service: Orthopedics;  Laterality: Left;   TRANSURETHRAL RESECTION OF PROSTATE     Patient Active Problem List   Diagnosis Date Noted   High risk medication use 09/18/2023   Vitamin D deficiency 09/18/2023   Supplemental oxygen dependent 09/18/2023   Acute HFrEF (heart failure with reduced ejection fraction) (HCC) 09/08/2023   PAF (paroxysmal atrial fibrillation) (HCC) 09/07/2023   Acute respiratory failure with hypoxia (HCC) 09/05/2023   Acute heart failure with preserved  ejection fraction (HFpEF) (HCC) 09/05/2023   Lobar pneumonia (HCC) 09/05/2023   Cerebrovascular accident (CVA) due to embolism of left middle cerebral artery (HCC) 08/10/2023   Paresthesia of both feet 07/28/2023   Gastroesophageal reflux disease with esophagitis without hemorrhage 09/10/2021   Primary insomnia 09/10/2021   Vasculogenic erectile dysfunction 09/10/2021   Status post total replacement of left hip 01/07/2021   Primary osteoarthritis of left hip 01/06/2021   Leg cramps 12/11/2020   Thyromegaly 05/19/2017   Neoplasm of scalp 12/08/2013   Rhinitis 12/08/2013   Cough 12/08/2013   Hyperlipidemia    Cancer (HCC)    Arthritis    Severe stage glaucoma 03/26/2012   Nuclear sclerosis 03/26/2012   Primary open angle glaucoma 10/27/2011   Essential hypertension 02/04/2011   Degenerative disc disease 02/04/2011   Gastrointestinal bleed 02/04/2011   Anemia 02/04/2011   Prostate cancer (HCC) 02/04/2011   PCP: Dr. [name] REFERRING PROVIDER: Dr. [name]  ONSET DATE: 08/07/23  REFERRING DIAG: I69.998,R53.1 (ICD-10-CM) - Weakness as late effect of cerebrovascular accident (CVA)   THERAPY DIAG:  Other lack of coordination  Other symptoms and signs involving the nervous system  Rationale for Evaluation and Treatment: Rehabilitation  SUBJECTIVE:   SUBJECTIVE STATEMENT: S:  Will I ever get my hand working again? Pt accompanied by: self  PERTINENT HISTORY: Pt is an 83 y/o male who reports he had a stroke on September 19th. From [facility]. Left sided CVA impacting the R side.   PRECAUTIONS: Fall  WEIGHT BEARING RESTRICTIONS: No  PAIN:  Are you having pain? No  FALLS: Has patient fallen in last 6 months? No  PLOF: Independent  PATIENT GOALS: To get stronger and resume independence in ADLs.   OBJECTIVE:  Note: Objective measures were completed at Evaluation unless otherwise noted.  HAND DOMINANCE: Right  ADLs: Overall ADLs: Pt reports difficulty  with gripping items, pt is unable to use a toothbrush or razor, is using LUE for ADLs. Pt is unable to tie his shoes, has not tried to hold a pencil. Using left hand to feed himself.  Pt enjoys golfing, fishing, and yardwork.    FUNCTIONAL OUTCOME MEASURES: FOTO: 78/100   UPPER EXTREMITY ROM:      A/ROM of BUE is [facility]  UPPER EXTREMITY MMT:     MMT Right eval Right 12/07/23  Shoulder flexion 5/5 5/5  Shoulder abduction 4/5 (same as LUE) 4+/5  Shoulder internal rotation 5/5 5/5  Shoulder external rotation 5/5 5/5  Elbow flexion 5/5 5/5  Elbow extension 5/5 5/5  Wrist flexion 4/5 5/5  Wrist extension 3+/5 4/5  Wrist ulnar deviation 3+/5 5/5  Wrist radial deviation 3+/5 4+/5  Wrist pronation 5/5 5/5  Wrist supination 5/5 5/5  (Blank rows = not tested)  HAND FUNCTION: Grip strength: Right: 22 lbs; Left: 110 lbs, Lateral pinch: Right: 4 lbs, Left: 26 lbs, and 3 point pinch: Right: unable lbs, Left: 17 lbs 12/07/23: Grip strength: Right: 41 lbs; Lateral pinch: Right: 8 lbs, and 3 point pinch: Right: 6 lbs  COORDINATION: 9 Hole Peg test: Right: 1'48" with set-up sec; Left: 29.94 sec 12/07/23: 9 Hole Peg test: Right: 7'44" sec - on no slip mat  EDEMA: mild edema in hand  TODAY'S TREATMENT:                                                                                                                              DATE:   12/18/23 -Wrist Strengthening: 3#, flexion, extension, ulnar/radial deviation, supination/pronation, x12 -Wrist ABC's, green weighted ball -Digit ROM: composite flexion, abduction/adduction, opposition, finger taps, x12 -Digit Isolation: flexion/extension, each digit x10 -Digiflex: 3#, full squeeze x12, each digit (max difficulty) x8 -Gripper: 25# 10 medium beads, attempted 29# only able to pick up 1 medium bead -Pinch coordination: red resistance clip, max difficulty to keep tip to tip pinch and pick up/stack 6 cubes  12/14/23 -Wrist strengthening: 3#,  extension, 15 reps -Grip strengthening: hand gripper-large beads with gripper vertical for 3, horizontal for 2; 5 medium beads with gripper horizontal at 25#  -Red theraputty: flatten, cutting circles into putty using PVC pipe working on sustained grip with wrist flexion/extension -Chip manipulation: pt hold sequence chips in his  palm, working on translating to fingertips to drop into container. Pt with max difficulty using digits and thumb versus compensating with shaking. Increased time and consistent verbal cuing, dropping chips each attempt -Sponges: 4, 5, 4  12/11/23 -Theraputty: red putty, roll into a ball, flatten into a pancake, roll into a log, tripod pinch x10, lateral pinch x10, roll into ball, squeeze x10 -Digit ROM: composite flexion, abduction/adduction, finger taps, opposition, x10 -Digit flexion/extension isolation, each digit x10 -Pinch tree: red, green, blue resistance clips x7 each, up with tripod pinch/down with lateral pinch -Wrist ROM: flexion, extension, ulnar/radial deviation, supination/pronation, x10    PATIENT EDUCATION: Education details: Get "chip clips" and work on tip to tip [employer] Person educated: Patient Education method: Programmer, multimedia, Facilities manager, and Handouts Education comprehension: verbalized understanding and returned demonstration  HOME EXERCISE PROGRAM: Eval: yellow theraputty-grip and pinch strengthening 12/17: Wrist and digit ROM 12/27: pinch activities  1/24: Digit Isolation 1/31: Get "chip clips" and work on tip to tip pinch   GOALS: Goals reviewed with patient? Yes  SHORT TERM GOALS: Target date: 11/27/23  Pt will be provided with and educated on HEP to improve mobility and use of RUE required for ADL completion.   Goal status: IN PROGRESS  2.  Pt will increase RUE strength to 4+/5 to improve ability to perform yardwork and fishing activities using RUE as dominant.   Goal status: MET  3.  Pt will increase right grip strength by 30+  pounds and pinch strength by 10+ pounds to improve ability to grip and utilize tools such as a toothbrush or razor with the right hand.   Goal status: IN PROGRESS  4.  Pt will improve RUE coordination required for tying shoes and operate fishing gear by completing 9 hole peg test in under 1'.   Goal status: IN PROGRESS    ASSESSMENT:  CLINICAL IMPRESSION: This session pt is continuing to demonstrate improving coordination and hand mobility, however continues to have limited digit isolation and overall motor control from the wrist to the fingers. Pt working on digit mobility to improve ability to grasp and manipulate objects, as well as using the gripper to improve overall strength in the hand. Verbal and tactile cuing provided for positioning and technique.   PERFORMANCE DEFICITS: in functional skills including ADLs, IADLs, coordination, dexterity, edema, tone, ROM, strength, and UE functional use   PLAN:  OT FREQUENCY: 2x/week  OT DURATION: 4 weeks  PLANNED INTERVENTIONS: 97168 OT Re-evaluation, 97535 self care/ADL training, 16109 therapeutic exercise, 97530 therapeutic activity, 97112 neuromuscular re-education, 97140 manual therapy, 97018 paraffin, 60454 electrical stimulation (manual), patient/family education, and DME and/or AE instructions  RECOMMENDED OTHER SERVICES: PT evaluation  CONSULTED AND AGREED WITH PLAN OF CARE: Patient  PLAN FOR NEXT SESSION: follow up on HEP, wrist and grip strengthening, coordination work   [employer], OTR/L [phone] 12/18/2023, 3:35 PM

## 2023-12-21 ENCOUNTER — Ambulatory Visit (HOSPITAL_COMMUNITY): Payer: PPO | Admitting: Occupational Therapy

## 2023-12-21 ENCOUNTER — Encounter (HOSPITAL_COMMUNITY): Payer: Self-pay | Admitting: Occupational Therapy

## 2023-12-21 ENCOUNTER — Ambulatory Visit (HOSPITAL_COMMUNITY): Payer: PPO | Attending: Family Medicine | Admitting: Speech Pathology

## 2023-12-21 ENCOUNTER — Encounter (HOSPITAL_COMMUNITY): Payer: Self-pay | Admitting: Speech Pathology

## 2023-12-21 DIAGNOSIS — R471 Dysarthria and anarthria: Secondary | ICD-10-CM | POA: Diagnosis not present

## 2023-12-21 DIAGNOSIS — R29818 Other symptoms and signs involving the nervous system: Secondary | ICD-10-CM | POA: Diagnosis not present

## 2023-12-21 DIAGNOSIS — R278 Other lack of coordination: Secondary | ICD-10-CM | POA: Insufficient documentation

## 2023-12-21 NOTE — Therapy (Signed)
OUTPATIENT SPEECH LANGUAGE PATHOLOGY TREATMENT   Patient Name: Joshua Compton. MRN: [id number] DOB:12-Sep-1941, 83 y.o., male 65 Date: 12/21/2023  PCP: [name], MD REFERRING PROVIDER: [name], MD  END OF SESSION:  End of Session - 12/21/23 1001     Visit Number 3    Number of Visits 5    Date for SLP Re-Evaluation 01/14/24    Authorization Type [employer]   PER AVAILTY (AETNA) EFF 11/17/20 DED MET $4500 OOP ($3,473.00) $20.00 COPAY NO AUTH REQ   SLP Start Time 0945    SLP Stop Time  1030    SLP Time Calculation (min) 45 min    Activity Tolerance Patient tolerated treatment well             Past Medical History:  Diagnosis Date   Arthritis    Cancer (HCC)    prostate ca   seed implants   GERD (gastroesophageal reflux disease)    Glaucoma    Hyperlipidemia    Hypertension    dr [place] Charon     [facility]   fm   Stroke [facility]) 07/2023   Past Surgical History:  Procedure Laterality Date   BACK SURGERY     x2   COLONOSCOPY     HERNIA REPAIR  11-06-2009   left inguinal repair    NECK SURGERY     ROTATOR CUFF REPAIR Left jan 2016   TOTAL HIP ARTHROPLASTY Right 04/20/2020   Procedure: RIGHT TOTAL HIP ARTHROPLASTY ANTERIOR APPROACH;  Surgeon: Tarry Kos, MD;  Location: [facility] OR;  Service: Orthopedics;  Laterality: Right;   TOTAL HIP ARTHROPLASTY Left 01/07/2021   Procedure: LEFT TOTAL HIP ARTHROPLASTY ANTERIOR APPROACH;  Surgeon: Tarry Kos, MD;  Location: [facility] OR;  Service: Orthopedics;  Laterality: Left;   TRANSURETHRAL RESECTION OF PROSTATE     Patient Active Problem List   Diagnosis Date Noted   High risk medication use 09/18/2023   Vitamin D deficiency 09/18/2023   Supplemental oxygen dependent 09/18/2023   Acute HFrEF (heart failure with reduced ejection fraction) (HCC) 09/08/2023   PAF (paroxysmal atrial fibrillation) (HCC) 09/07/2023   Acute respiratory failure with hypoxia (HCC) 09/05/2023   Acute heart failure with preserved ejection  fraction (HFpEF) (HCC) 09/05/2023   Lobar pneumonia (HCC) 09/05/2023   Cerebrovascular accident (CVA) due to embolism of left middle cerebral artery (HCC) 08/10/2023   Paresthesia of both feet 07/28/2023   Gastroesophageal reflux disease with esophagitis without hemorrhage 09/10/2021   Primary insomnia 09/10/2021   Vasculogenic erectile dysfunction 09/10/2021   Status post total replacement of left hip 01/07/2021   Primary osteoarthritis of left hip 01/06/2021   Leg cramps 12/11/2020   Thyromegaly 05/19/2017   Neoplasm of scalp 12/08/2013   Rhinitis 12/08/2013   Cough 12/08/2013   Hyperlipidemia    Cancer (HCC)    Arthritis    Severe stage glaucoma 03/26/2012   Nuclear sclerosis 03/26/2012   Primary open angle glaucoma 10/27/2011   Essential hypertension 02/04/2011   Degenerative disc disease 02/04/2011   Gastrointestinal bleed 02/04/2011   Anemia 02/04/2011   Prostate cancer (HCC) 02/04/2011    ONSET DATE: 08/07/2023  REFERRING DIAG: R29.810 (ICD-10-CM) - Weakness on right side of face I69.390 (ICD-10-CM) - 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 The patient was admitted to the hospital from 08/07/2023 to 08/14/2023. He was transferred to [facility] from [facility] for right-sided weakness and aphasia. The patient was found to have an acute left frontal lobe infarct that was felt to be cryptogenic. CTA of the head and neck was negative for LVO. Echocardiogram showed EF 50-55%, no WMA, normal diastolic function, normal RV function, trace MR/TR, mild AI, no AS. He was discharged to inpatient rehab where he stayed from 08/14/2023 to 08/24/2023. He was discharged with  aspirin 81 mg daily and Lipitor 40 mg daily for secondary prophylaxis. He has since returned home to live with his daughter and spouse.  PAIN:  Are you having pain? No  FALLS: Has patient fallen in last 6 months?  No  LIVING ENVIRONMENT: Lives with: lives with their family and lives with their spouse Lives in: House/apartment  PLOF:  Level of assistance: Independent with ADLs, Independent with IADLs Employment: Retired  PATIENT GOALS: Improve voice, speech, and his right hand  OBJECTIVE:  Note: Objective measures were completed at Evaluation unless otherwise noted.  DIAGNOSTIC FINDINGS:  MRI Head WO Contrast 08/07/23 1. Acute to subacute infarction within the posterior aspect of the left frontal lobe. Small amount of associated petechial hemorrhage.   PATIENT EDUCATION: Education details: dysarthria treatment strategies-over articulate, reduce rate, swallow 2x/minute, and avoid clearing throat, words to practice provided Person educated: Patient Education method: Explanation, Demonstration, and Handouts Education comprehension: verbalized understanding  TODAY'S TREATMENT:  He did not bring his folder this session. OT reported increased dysarthria during her session and difficulty completing shape matching. SLP also noted reduced speech intelligibility when walking from OT to SLP room and asked for clarification several times. He stated that he felt like a payment was missed and is going to follow up by looking for his receipts. Pt read list of speech intelligibility strategies and asked to implement in structured oral reading task (100% acc) and then during word description task. He described pictured objects in barrier task with 100% acc and with 100% intelligibility. Pt then participated in conversation and reminded to use strategies and he did so with min cues. Pt making good progress toward goals, however need to focus more on speech intelligibility in conversation.  DATE:  12/21/23  GOALS: Goals reviewed with patient? Yes  SHORT TERM GOALS: Target date: 01/13/2024  Pt will implement fluency enhancing and speech intelligibility strategies when generating sentences involving multisyllabic words with 90% acc and min assist. Baseline: mi/mod assist Goal status: ONGOING  2.  Pt will utilize speech intelligibility strategies (over articulation, reduced speaking rate, and vocal intensity) at the conversation level with 90% acc and min cues. Baseline: ~75% intelligible Goal status: ONGOING  3.  Pt will Decrease chronic cough by 80% via reduction in laryngeal hypersensitivity. a. Sip liquid instead of coughing  b. Follow reflux recommendations (if applicable) c. Increase hydration to a minimum of 48+oz of water d. Improve breath support during speech (not speaking to end of breath) Baseline: Pt cleared throat frequently throughout the evaluation and reports taking reflux medication with breakfast Goal status: ONGOING  LONG TERM GOALS: Same as short term goals  ASSESSMENT:  CLINICAL IMPRESSION: Patient is an 83 y.o. male who was seen today for a cognitive linguistic evaluation s/p CVA 08/07/2023.  Pt lives with his spouse who has mild dementia. He was independent with managing finances and medications. Patient presents with a mild dysarthria c/b decreased  speech intelligibility, reduced articulatory precision, and low volume speech. Patient was ~75% intelligible in conversation. Basic expressive and receptive language skills appear WFL. Confrontation naming, repetition, and automatic speech tasks were completed with 100% accuracy. Two step directives were followed independently. Basic yes/no questions were answered with 100% accuracy. Pt scored a 24/30 on the [facility] SLUMS with deficits in mental calculation, story recall, and attention. Pt is motivated to improve his speech intelligibility and resume previously enjoyed activities (golfing, fishing, working on his car,  gardening, and Microbiologist). He has excellent family support.  OBJECTIVE IMPAIRMENTS: include dysarthria. These impairments are limiting patient from effectively communicating at home and in community. Factors affecting potential to achieve goals and functional outcome are  consideration of GERD impacting voice . Patient will benefit from skilled SLP services to address above impairments and improve overall function.  REHAB POTENTIAL: Excellent  PLAN:  SLP FREQUENCY: 1x/week  SLP DURATION: 4 weeks  PLANNED INTERVENTIONS: [phone] [address], Artic, Phon, Eval Compre, Express, [phone] Treatment of speech (30 or 45 min) , Cueing hierachy, Oral motor exercises, SLP instruction and feedback, Compensatory strategies, and Patient/family education   Thank you,  Havery Moros, CCC-SLP [phone]  Takasha Vetere, CCC-SLP 12/21/2023, 10:02 AM

## 2023-12-21 NOTE — Therapy (Signed)
OUTPATIENT OCCUPATIONAL THERAPY NEURO TREATMENT NOTE  Patient Name: Joshua Compton. MRN: [id number] DOB:07-09-1941, 83 y.o., male Today's Date: 12/21/2023   END OF SESSION:  OT End of Session - 12/21/23 0931     Visit Number 10    Number of Visits 14    Date for OT Re-Evaluation 01/08/24    Authorization Type [employer], $20 copay    Authorization Time Period no auth required    Progress Note Due on Visit 10    OT Start Time [phone]    OT Stop Time 0931    OT Time Calculation (min) 40 min    Activity Tolerance Patient tolerated treatment well    Behavior During Therapy WFL for tasks assessed/performed             Past Medical History:  Diagnosis Date   Arthritis    Cancer (HCC)    prostate ca   seed implants   GERD (gastroesophageal reflux disease)    Glaucoma    Hyperlipidemia    Hypertension    dr [place] Charon     [facility]   fm   Stroke [facility]) 07/2023   Past Surgical History:  Procedure Laterality Date   BACK SURGERY     x2   COLONOSCOPY     HERNIA REPAIR  11-06-2009   left inguinal repair    NECK SURGERY     ROTATOR CUFF REPAIR Left jan 2016   TOTAL HIP ARTHROPLASTY Right 04/20/2020   Procedure: RIGHT TOTAL HIP ARTHROPLASTY ANTERIOR APPROACH;  Surgeon: Tarry Kos, MD;  Location: [facility] OR;  Service: Orthopedics;  Laterality: Right;   TOTAL HIP ARTHROPLASTY Left 01/07/2021   Procedure: LEFT TOTAL HIP ARTHROPLASTY ANTERIOR APPROACH;  Surgeon: Tarry Kos, MD;  Location: [facility] OR;  Service: Orthopedics;  Laterality: Left;   TRANSURETHRAL RESECTION OF PROSTATE     Patient Active Problem List   Diagnosis Date Noted   High risk medication use 09/18/2023   Vitamin D deficiency 09/18/2023   Supplemental oxygen dependent 09/18/2023   Acute HFrEF (heart failure with reduced ejection fraction) (HCC) 09/08/2023   PAF (paroxysmal atrial fibrillation) (HCC) 09/07/2023   Acute respiratory failure with hypoxia (HCC) 09/05/2023   Acute heart failure with preserved ejection  fraction (HFpEF) (HCC) 09/05/2023   Lobar pneumonia (HCC) 09/05/2023   Cerebrovascular accident (CVA) due to embolism of left middle cerebral artery (HCC) 08/10/2023   Paresthesia of both feet 07/28/2023   Gastroesophageal reflux disease with esophagitis without hemorrhage 09/10/2021   Primary insomnia 09/10/2021   Vasculogenic erectile dysfunction 09/10/2021   Status post total replacement of left hip 01/07/2021   Primary osteoarthritis of left hip 01/06/2021   Leg cramps 12/11/2020   Thyromegaly 05/19/2017   Neoplasm of scalp 12/08/2013   Rhinitis 12/08/2013   Cough 12/08/2013   Hyperlipidemia    Cancer (HCC)    Arthritis    Severe stage glaucoma 03/26/2012   Nuclear sclerosis 03/26/2012   Primary open angle glaucoma 10/27/2011   Essential hypertension 02/04/2011   Degenerative disc disease 02/04/2011   Gastrointestinal bleed 02/04/2011   Anemia 02/04/2011   Prostate cancer (HCC) 02/04/2011   PCP: Dr. [name] REFERRING PROVIDER: Dr. [name]  ONSET DATE: 08/07/23  REFERRING DIAG: I69.998,R53.1 (ICD-10-CM) - Weakness as late effect of cerebrovascular accident (CVA)   THERAPY DIAG:  Other lack of coordination  Other symptoms and signs involving the nervous system  Rationale for Evaluation and Treatment: Rehabilitation  SUBJECTIVE:   SUBJECTIVE STATEMENT: S: "I worked this  hand a lot this weekend" Pt accompanied by: self  PERTINENT HISTORY: Pt is an 83 y/o male who reports he had a stroke on September 19th. From [facility]. Left sided CVA impacting the R side.   PRECAUTIONS: Fall  WEIGHT BEARING RESTRICTIONS: No  PAIN:  Are you having pain? No  FALLS: Has patient fallen in last 6 months? No  PLOF: Independent  PATIENT GOALS: To get stronger and resume independence in ADLs.   OBJECTIVE:  Note: Objective measures were completed at Evaluation unless otherwise noted.  HAND DOMINANCE: Right  ADLs: Overall ADLs: Pt reports difficulty with  gripping items, pt is unable to use a toothbrush or razor, is using LUE for ADLs. Pt is unable to tie his shoes, has not tried to hold a pencil. Using left hand to feed himself.  Pt enjoys golfing, fishing, and yardwork.    FUNCTIONAL OUTCOME MEASURES: FOTO: 78/100   UPPER EXTREMITY ROM:      A/ROM of BUE is [facility]  UPPER EXTREMITY MMT:     MMT Right eval Right 12/07/23  Shoulder flexion 5/5 5/5  Shoulder abduction 4/5 (same as LUE) 4+/5  Shoulder internal rotation 5/5 5/5  Shoulder external rotation 5/5 5/5  Elbow flexion 5/5 5/5  Elbow extension 5/5 5/5  Wrist flexion 4/5 5/5  Wrist extension 3+/5 4/5  Wrist ulnar deviation 3+/5 5/5  Wrist radial deviation 3+/5 4+/5  Wrist pronation 5/5 5/5  Wrist supination 5/5 5/5  (Blank rows = not tested)  HAND FUNCTION: Grip strength: Right: 22 lbs; Left: 110 lbs, Lateral pinch: Right: 4 lbs, Left: 26 lbs, and 3 point pinch: Right: unable lbs, Left: 17 lbs 12/07/23: Grip strength: Right: 41 lbs; Lateral pinch: Right: 8 lbs, and 3 point pinch: Right: 6 lbs  COORDINATION: 9 Hole Peg test: Right: 1'48" with set-up sec; Left: 29.94 sec 12/07/23: 9 Hole Peg test: Right: 7'44" sec - on no slip mat  EDEMA: mild edema in hand  TODAY'S TREATMENT:                                                                                                                              DATE:   12/21/23 -Perfection game: in hand translation holding 2 and placing them in correct spots (max difficulty) -Nuts and bolts: unscrewing and screwing back 8 nuts onto the bolts of different sizes and threads -Digit ROM: composite flexion, abduction/adduction, opposition, finger taps, x12 -Digit Isolation: flexion/extension, each digit x10  12/18/23 -Wrist Strengthening: 3#, flexion, extension, ulnar/radial deviation, supination/pronation, x12 -Wrist ABC's, green weighted ball -Digit ROM: composite flexion, abduction/adduction, opposition, finger taps, x12 -Digit  Isolation: flexion/extension, each digit x10 -Digiflex: 3#, full squeeze x12, each digit (max difficulty) x8 -Gripper: 25# 10 medium beads, attempted 29# only able to pick up 1 medium bead -Pinch coordination: red resistance clip, max difficulty to keep tip to tip pinch and pick up/stack 6 cubes  12/14/23 -Wrist strengthening: 3#, extension, 15 reps -Grip  strengthening: hand gripper-large beads with gripper vertical for 3, horizontal for 2; 5 medium beads with gripper horizontal at 25#  -Red theraputty: flatten, cutting circles into putty using PVC pipe working on sustained grip with wrist flexion/extension -Chip manipulation: pt hold sequence chips in his palm, working on translating to fingertips to drop into container. Pt with max difficulty using digits and thumb versus compensating with shaking. Increased time and consistent verbal cuing, dropping chips each attempt -Sponges: 4, 5, 4   PATIENT EDUCATION: Education details: Continue HEP Person educated: Patient Education method: Explanation, Demonstration, and Handouts Education comprehension: verbalized understanding and returned demonstration  HOME EXERCISE PROGRAM: Eval: yellow theraputty-grip and pinch strengthening 12/17: Wrist and digit ROM 12/27: pinch activities  1/24: Digit Isolation 1/31: Get "chip clips" and work on tip to tip pinch   GOALS: Goals reviewed with patient? Yes  SHORT TERM GOALS: Target date: 11/27/23  Pt will be provided with and educated on HEP to improve mobility and use of RUE required for ADL completion.   Goal status: IN PROGRESS  2.  Pt will increase RUE strength to 4+/5 to improve ability to perform yardwork and fishing activities using RUE as dominant.   Goal status: MET  3.  Pt will increase right grip strength by 30+ pounds and pinch strength by 10+ pounds to improve ability to grip and utilize tools such as a toothbrush or razor with the right hand.   Goal status: IN PROGRESS  4.  Pt  will improve RUE coordination required for tying shoes and operate fishing gear by completing 9 hole peg test in under 1'.   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 He has max difficulty with both the perfection game and nuts/bolts, requiring min assist from OT for placement, as well as increased time due to difficulty. Pt reporting working on his HEP and more exercises at home regularly and feels his strength is returning good. Verbal and tactile cuing provided for positioning and technique throughout session.   PERFORMANCE DEFICITS: in functional skills including ADLs, IADLs, coordination, dexterity, edema, tone, ROM, strength, and UE functional use   PLAN:  OT FREQUENCY: 2x/week  OT DURATION: 4 weeks  PLANNED INTERVENTIONS: 97168 OT Re-evaluation, 97535 self care/ADL training, 65784 therapeutic exercise, 97530 therapeutic activity, 97112 neuromuscular re-education, 97140 manual therapy, 97018 paraffin, 69629 electrical stimulation (manual), patient/family education, and DME and/or AE instructions  RECOMMENDED OTHER SERVICES: PT evaluation  CONSULTED AND AGREED WITH PLAN OF CARE: Patient  PLAN FOR NEXT SESSION: follow up on HEP, wrist and grip strengthening, coordination work   Trish Mage, OTR/L [phone] 12/21/2023, 9:33 AM

## 2023-12-22 ENCOUNTER — Ambulatory Visit (HOSPITAL_BASED_OUTPATIENT_CLINIC_OR_DEPARTMENT_OTHER): Payer: PPO | Admitting: Pulmonary Disease

## 2023-12-22 ENCOUNTER — Encounter (HOSPITAL_BASED_OUTPATIENT_CLINIC_OR_DEPARTMENT_OTHER): Payer: Self-pay | Admitting: Pulmonary Disease

## 2023-12-22 VITALS — BP 138/60 | HR 57 | Resp 14 | Ht 72.0 in | Wt 207.5 lb

## 2023-12-22 DIAGNOSIS — J449 Chronic obstructive pulmonary disease, unspecified: Secondary | ICD-10-CM

## 2023-12-22 DIAGNOSIS — I509 Heart failure, unspecified: Secondary | ICD-10-CM | POA: Diagnosis not present

## 2023-12-22 DIAGNOSIS — J432 Centrilobular emphysema: Secondary | ICD-10-CM | POA: Insufficient documentation

## 2023-12-22 DIAGNOSIS — I4891 Unspecified atrial fibrillation: Secondary | ICD-10-CM | POA: Diagnosis not present

## 2023-12-22 NOTE — Patient Instructions (Signed)
Schedule PFTs  Defer lasix dosing to PCP or cardiology

## 2023-12-22 NOTE — Progress Notes (Signed)
 Subjective:    Patient ID: Joshua Compton., male    DOB: 1941/05/04, 83 y.o.   MRN: [id number]  HPI  83 year old remote smoker presents to establish care for emphysema  PMH : CVA PAF  HFrEF : EF 40-45%  Discussed the use of AI scribe software for clinical note transcription with the patient, who gave verbal consent to proceed.  History of Present Illness   Mr. Joshua Compton, a patient with a history of AFib, stroke, and emphysema, presents for established care for COPD.  He reports no current issues with his heart and denies feeling any irregular heart rhythms. He had a stroke in September, which resulted in weakness in his right arm and speech difficulties. His speech has improved slightly, but his right hand remains swollen. He denies any issues with his leg or walking.  He reports that he was hospitalized 08/2023  due to shortness of breath, which was discovered to be due to CHF. He was put on oxygen  for a month, but his oxygen  levels have since improved and he no longer requires oxygen  therapy. He reports that his breathing is currently fine and denies any current shortness of breath. However, he does report occasional wheezing and a persistent cough with white phlegm, which he has had for years.  He also reports fluid buildup in his legs, which he believes is due to the medications he is currently taking. He is currently on Lasix  for fluid management, but reports that his legs are still swollen. lasix  was decreased to 20 mg due to worsening creatinine. He also reports that he has been prescribed an albuterol  inhaler, which he uses infrequently.   LABS Creatinine: 1.9 (11/30/2023) Creatinine: 1.5 (12/10/2023)       Significant tests/ events reviewed  CTA chest 08/2023 >>  Diffuse Emphysema, small effusions RLL nodule -resolved RT lobe thyroid  enlarged  Past Medical History:  Diagnosis Date   Arthritis    Cancer (HCC)    prostate ca   seed implants   GERD (gastroesophageal  reflux disease)    Glaucoma    Hyperlipidemia    Hypertension    dr cyrena     [facility]   fm   Stroke [facility]) 07/2023   Past Surgical History:  Procedure Laterality Date   BACK SURGERY     x2   COLONOSCOPY     HERNIA REPAIR  11-06-2009   left inguinal repair    NECK SURGERY     ROTATOR CUFF REPAIR Left jan 2016   TOTAL HIP ARTHROPLASTY Right 04/20/2020   Procedure: RIGHT TOTAL HIP ARTHROPLASTY ANTERIOR APPROACH;  Surgeon: Jerri Kay HERO, MD;  Location: [facility] OR;  Service: Orthopedics;  Laterality: Right;   TOTAL HIP ARTHROPLASTY Left 01/07/2021   Procedure: LEFT TOTAL HIP ARTHROPLASTY ANTERIOR APPROACH;  Surgeon: Jerri Kay HERO, MD;  Location: [facility] OR;  Service: Orthopedics;  Laterality: Left;   TRANSURETHRAL RESECTION OF PROSTATE      Allergies  Allergen Reactions   Iodinated Contrast Media Anaphylaxis    Tongue swelled up when he had MRI in [facility]  Intubated for airway protection after oral and throat swelling   Multihance  [Gadobenate] Swelling    Tongue swelled up when he had MRI in [facility]   Pravastatin  Other (See Comments)    Muscle aches   Pravastatin  Sodium     Muscle aches    Social History   Socioeconomic History   Marital status: Married    Spouse name: Not on file   Number of  children: 5   Years of education: 11   Highest education level: 11th grade  Occupational History   Occupation: Nutritional Therapist    Comment: retired  Tobacco Use   Smoking status: Former    Current packs/day: 0.00    Average packs/day: 0.5 packs/day for 30.0 years (15.0 ttl pk-yrs)    Types: Cigarettes    Start date: 03/29/1975    Quit date: 03/28/2005    Years since quitting: 18.7   Smokeless tobacco: Never  Vaping Use   Vaping status: Never Used  Substance and Sexual Activity   Alcohol use: No    Alcohol/week: 0.0 standard drinks of alcohol   Drug use: No   Sexual activity: Yes  Other Topics Concern   Not on file  Social History Narrative   Grandson lives with them - no  stairs   Social Drivers of Corporate Investment Banker Strain: Low Risk  (09/16/2023)   Received from [facility]   Overall Financial Resource Strain (CARDIA)    Difficulty of Paying Living Expenses: Not hard at all  Food Insecurity: No Food Insecurity (09/16/2023)   Received from [facility]   Hunger Vital Sign    Worried About Running Out of Food in the Last Year: Never true    Ran Out of Food in the Last Year: Never true  Transportation Needs: No Transportation Needs (09/16/2023)   Received from [facility] - Transportation    Lack of Transportation (Medical): No    Lack of Transportation (Non-Medical): No  Physical Activity: Sufficiently Active (01/14/2023)   Exercise Vital Sign    Days of Exercise per Week: 5 days    Minutes of Exercise per Session: 30 min  Stress: Patient Unable To Answer (08/07/2023)   Received from [facility] - Occupational Stress Questionnaire    Feeling of Stress : Patient unable to answer  Social Connections: Unknown (08/06/2023)   Received from [facility]   Social Network    Social Network: Not on file  Intimate Partner Violence: Not At Risk (09/05/2023)   Humiliation, Afraid, Rape, and Kick questionnaire    Fear of Current or Ex-Partner: No    Emotionally Abused: No    Physically Abused: No    Sexually Abused: No    Family History  Problem Relation Age of Onset   Cancer Mother        breast   Heart disease Father    Cancer Brother    Cancer Brother      Review of Systems  + lower extremity edema,  Constitutional: negative for anorexia, fevers and sweats  Eyes: negative for irritation, redness and visual disturbance  Ears, nose, mouth, throat, and face: negative for earaches, epistaxis, nasal congestion and sore throat  Respiratory: negative for cough, dyspnea on exertion, sputum and wheezing  Cardiovascular: negative for chest pain, dyspnea,  orthopnea, palpitations and  syncope  Gastrointestinal: negative for abdominal pain, constipation, diarrhea, melena, nausea and vomiting  Genitourinary:negative for dysuria, frequency and hematuria  Hematologic/lymphatic: negative for bleeding, easy bruising and lymphadenopathy  Musculoskeletal:negative for arthralgias, muscle weakness and stiff joints  Neurological: negative for coordination problems, gait problems, headaches and weakness  Endocrine: negative for diabetic symptoms including polydipsia, polyuria and weight loss      Objective:   Physical Exam  Gen. Pleasant, obese, in no distress, normal affect ENT - no pallor,icterus, no post nasal drip, class 2 airway Neck: No JVD, no thyromegaly,  no carotid bruits Lungs: no use of accessory muscles, no dullness to percussion, decreased without rales or rhonchi  Cardiovascular: Rhythm regular, heart sounds  normal, no murmurs or gallops, no peripheral edema Abdomen: soft and non-tender, no hepatosplenomegaly, BS normal. Musculoskeletal: No deformities, no cyanosis or clubbing Neuro:  alert, non focal, no tremors, RUE weakness 4/5       Assessment & Plan:     Assessment and Plan    Chronic Obstructive Pulmonary Disease (COPD) / Emphysema Quit smoking 2006. Reports occasional wheezing, persistent cough with white phlegm, and shortness of breath. Previous lung scan showed emphysema. Discussed obtaining Pulmonary Function Tests (PFTs) to assess lung function. Explained that if PFT results are mildly off, will continue albuterol ; if significantly off, may need additional medication. - Obtain Pulmonary Function Tests (PFTs) - Continue albuterol  as needed - Schedule PFTs in [place] for convenience  Congestive Heart Failure (CHF) Diagnosed post-stroke in September. On multiple medications including Entresto . Reports persistent leg and hand swelling, and occasional shortness of breath. Lasix  dose reduced from 40 mg to 20 mg due to kidney function concerns.  Current creatinine levels are 1.5, down from 1.9. Discussed increasing Lasix  to 40 mg temporarily to manage swelling, with close monitoring of kidney function. Heart pump function slightly reduced (45% vs. normal 55-60%), contributing to fluid buildup and shortness of breath. - Increase Lasix  to 40 mg temporarily to manage swelling - Monitor kidney function closely - Follow up with primary care physician and cardiologist  Atrial Fibrillation (AFib) May have contributed to the stroke. On multiple medications for AFib. Discussed the importance of continuing current medications to manage AFib and prevent further complications. - Continue current AFib medications - Follow up with cardiologist as scheduled  Post-Stroke Complications Stroke in September resulted in right arm weakness and speech difficulties. Reports persistent hand swelling and difficulty with fine motor skills. Discussed that the stroke likely caused these symptoms and emphasized the importance of continuing stroke rehabilitation therapies. - Continue current stroke rehabilitation therapies - Monitor for any new symptoms or complications  General Health Maintenance On multiple medications for heart failure, AFib, and cholesterol management. No new general health maintenance issues discussed. - Continue current medication regimen - Regular follow-ups with primary care physician and specialists  Follow-up - Schedule six-month follow-up with pulmonologist - Follow up with cardiologist in April - Monitor results of PFTs and adjust treatment as necessary.

## 2023-12-24 ENCOUNTER — Emergency Department (HOSPITAL_COMMUNITY): Payer: PPO

## 2023-12-24 ENCOUNTER — Emergency Department (HOSPITAL_COMMUNITY)
Admission: EM | Admit: 2023-12-24 | Discharge: 2023-12-24 | Disposition: A | Discharge status: Home / Self Care | Payer: PPO | Attending: Emergency Medicine | Admitting: Emergency Medicine

## 2023-12-24 ENCOUNTER — Ambulatory Visit
Admission: EM | Admit: 2023-12-24 | Discharge: 2023-12-24 | Disposition: A | Discharge status: Home / Self Care | Payer: PPO

## 2023-12-24 ENCOUNTER — Ambulatory Visit: Payer: Self-pay | Admitting: Family Medicine

## 2023-12-24 ENCOUNTER — Encounter (HOSPITAL_COMMUNITY): Payer: Self-pay | Admitting: Occupational Therapy

## 2023-12-24 ENCOUNTER — Other Ambulatory Visit: Payer: Self-pay

## 2023-12-24 ENCOUNTER — Ambulatory Visit (HOSPITAL_COMMUNITY): Payer: PPO | Admitting: Occupational Therapy

## 2023-12-24 ENCOUNTER — Encounter (HOSPITAL_COMMUNITY): Payer: Self-pay

## 2023-12-24 DIAGNOSIS — Z7901 Long term (current) use of anticoagulants: Secondary | ICD-10-CM | POA: Insufficient documentation

## 2023-12-24 DIAGNOSIS — Z79899 Other long term (current) drug therapy: Secondary | ICD-10-CM | POA: Diagnosis not present

## 2023-12-24 DIAGNOSIS — S161XXA Strain of muscle, fascia and tendon at neck level, initial encounter: Secondary | ICD-10-CM | POA: Diagnosis not present

## 2023-12-24 DIAGNOSIS — Z8546 Personal history of malignant neoplasm of prostate: Secondary | ICD-10-CM | POA: Diagnosis not present

## 2023-12-24 DIAGNOSIS — S0990XA Unspecified injury of head, initial encounter: Secondary | ICD-10-CM | POA: Diagnosis not present

## 2023-12-24 DIAGNOSIS — W19XXXA Unspecified fall, initial encounter: Secondary | ICD-10-CM | POA: Diagnosis not present

## 2023-12-24 DIAGNOSIS — T148XXA Other injury of unspecified body region, initial encounter: Secondary | ICD-10-CM

## 2023-12-24 DIAGNOSIS — R29818 Other symptoms and signs involving the nervous system: Secondary | ICD-10-CM

## 2023-12-24 DIAGNOSIS — I6782 Cerebral ischemia: Secondary | ICD-10-CM | POA: Diagnosis not present

## 2023-12-24 DIAGNOSIS — I10 Essential (primary) hypertension: Secondary | ICD-10-CM | POA: Diagnosis not present

## 2023-12-24 DIAGNOSIS — I639 Cerebral infarction, unspecified: Secondary | ICD-10-CM | POA: Diagnosis not present

## 2023-12-24 DIAGNOSIS — R278 Other lack of coordination: Secondary | ICD-10-CM

## 2023-12-24 DIAGNOSIS — S199XXA Unspecified injury of neck, initial encounter: Secondary | ICD-10-CM | POA: Diagnosis not present

## 2023-12-24 MED ORDER — METHOCARBAMOL 500 MG PO TABS: 500.0000 mg | ORAL_TABLET | Freq: Three times a day (TID) | ORAL | 0 refills | Status: DC | PRN

## 2023-12-24 MED ORDER — METHOCARBAMOL 500 MG PO TABS
500.0000 mg | ORAL_TABLET | Freq: Once | ORAL | Status: AC
  Administered 2023-12-24: 500 mg via ORAL
  Filled 2023-12-24: qty 1

## 2023-12-24 NOTE — ED Notes (Signed)
 Patient is being discharged from the Urgent Care and sent to the Emergency Department via POV . Per provider, patient is in need of higher level of care due to head injury. Patient is aware and verbalizes understanding of plan of care.  Vitals:   12/24/23 1541  BP: (!) 190/76  Pulse: (!) 57  Resp: 12  Temp: 97.8 F (36.6 C)  SpO2: 94%

## 2023-12-24 NOTE — Telephone Encounter (Signed)
  Chief Complaint: fall  Symptoms: bad that starts in neck and shoots up into head Frequency: constant  Disposition: [] ED /[x] Urgent Care (no appt availability in office) / [] Appointment(In office/virtual)/ []  [facility] Virtual Care/ [] Home Care/ [] Refused Recommended Disposition /[] [place] [facility]/ []  Follow-up with PCP Additional Notes: Daughter [name] called regarding a pt fall earlier this afternoon. Pt states he turned around too fast, lost balance, and landed on bottom. EMS was called out to help pt up and notified daughter.  Pt refused to go to ED due concerns of getting flu/covid. Pt is now experiencing sharp pain that starts in neck and travels to head. Pt denies headache, chest pain, weakness in extremities. Per protocol, pt to be seen in ED,but refusing to go. RN advised pt to seek care. Natalie stated she was taking pt to urgent care for xray. RN gave care advice. Natalie and pt seemed irritated throughout triage process.              Copied from CRM [id number]. Topic: Clinical - Red Word Triage >> Dec 24, 2023  2:18 PM Zebedee SAUNDERS wrote: Red Word that prompted transfer to Nurse Triage: Pt's daughter [name] called stated pt fell today and EMS were called out. Pt head hurting. Reason for Disposition  [1] Unable to get up until help (e.g., caregiver, family, friend) arrived AND [2] on the ground 1 hour or more  Answer Assessment - Initial Assessment Questions 1. MECHANISM: How did the fall happen?     Turned around too fast and lost balance  2. DOMESTIC VIOLENCE AND ELDER ABUSE SCREENING: Did you fall because someone pushed you or tried to hurt you? If Yes, ask: Are you safe now?     na 3. ONSET: When did the fall happen? (e.g., minutes, hours, or days ago)     40 mins ago  4. LOCATION: What part of the body hit the ground? (e.g., back, buttocks, head, hips, knees, hands, head, stomach)     Bottom  5. INJURY: Did you hurt (injure) yourself when you fell? If  Yes, ask: What did you injure? Tell me more about this? (e.g., body area; type of injury; pain severity)     Yes,  6. PAIN: Is there any pain? If Yes, ask: How bad is the pain? (e.g., Scale 1-10; or mild,  moderate, severe)   - NONE (0): No pain   - MILD (1-3): Doesn't interfere with normal activities    - MODERATE (4-7): Interferes with normal activities or awakens from sleep    - SEVERE (8-10): Excruciating pain, unable to do any normal activities      7-8 7. SIZE: For cuts, bruises, or swelling, ask: How large is it? (e.g., inches or centimeters)      No   9. OTHER SYMPTOMS: Do you have any other symptoms? (e.g., dizziness, fever, weakness; new onset or worsening).      Denies  10. CAUSE: What do you think caused the fall (or falling)? (e.g., tripped, dizzy spell)       Unsure  Protocols used: Falls and [facility]

## 2023-12-24 NOTE — Telephone Encounter (Signed)
 FYI

## 2023-12-24 NOTE — Discharge Instructions (Signed)
 Go to the emergency department for further evaluation.

## 2023-12-24 NOTE — Therapy (Addendum)
 OUTPATIENT OCCUPATIONAL THERAPY NEURO TREATMENT NOTE PROGRESS NOTE  Patient Name: Joshua Compton. MRN: [id number] DOB:07-Oct-1941, 83 y.o., male Today's Date: 12/25/2023  Progress Note Reporting Period 10/30/23 to 12/24/23  See note below for Objective Data and Assessment of Progress/Goals.    END OF SESSION:  OT End of Session - 12/24/23 1025     Visit Number 11    Number of Visits 14    Date for OT Re-Evaluation 01/08/24    Authorization Type [employer], $20 copay    Authorization Time Period no auth required    Progress Note Due on Visit 10    OT Start Time 0945    OT Stop Time 1025    OT Time Calculation (min) 40 min    Activity Tolerance Patient tolerated treatment well    Behavior During Therapy WFL for tasks assessed/performed             Past Medical History:  Diagnosis Date   Arthritis    Cancer (HCC)    prostate ca   seed implants   GERD (gastroesophageal reflux disease)    Glaucoma    Hyperlipidemia    Hypertension    dr cyrena     [facility]   fm   Stroke [facility]) 07/2023   Past Surgical History:  Procedure Laterality Date   BACK SURGERY     x2   COLONOSCOPY     HERNIA REPAIR  11-06-2009   left inguinal repair    NECK SURGERY     ROTATOR CUFF REPAIR Left jan 2016   TOTAL HIP ARTHROPLASTY Right 04/20/2020   Procedure: RIGHT TOTAL HIP ARTHROPLASTY ANTERIOR APPROACH;  Surgeon: Jerri Kay HERO, MD;  Location: [facility] OR;  Service: Orthopedics;  Laterality: Right;   TOTAL HIP ARTHROPLASTY Left 01/07/2021   Procedure: LEFT TOTAL HIP ARTHROPLASTY ANTERIOR APPROACH;  Surgeon: Jerri Kay HERO, MD;  Location: [facility] OR;  Service: Orthopedics;  Laterality: Left;   TRANSURETHRAL RESECTION OF PROSTATE     Patient Active Problem List   Diagnosis Date Noted   Centrilobular emphysema (HCC) 12/22/2023   High risk medication use 09/18/2023   Vitamin D  deficiency 09/18/2023   Supplemental oxygen  dependent 09/18/2023   Acute HFrEF (heart failure with reduced ejection  fraction) (HCC) 09/08/2023   PAF (paroxysmal atrial fibrillation) (HCC) 09/07/2023   Acute respiratory failure with hypoxia (HCC) 09/05/2023   Acute heart failure with preserved ejection fraction (HFpEF) (HCC) 09/05/2023   Lobar pneumonia (HCC) 09/05/2023   Cerebrovascular accident (CVA) due to embolism of left middle cerebral artery (HCC) 08/10/2023   Paresthesia of both feet 07/28/2023   Gastroesophageal reflux disease with esophagitis without hemorrhage 09/10/2021   Primary insomnia 09/10/2021   Vasculogenic erectile dysfunction 09/10/2021   Status post total replacement of left hip 01/07/2021   Primary osteoarthritis of left hip 01/06/2021   Leg cramps 12/11/2020   Thyromegaly 05/19/2017   Neoplasm of scalp 12/08/2013   Rhinitis 12/08/2013   Cough 12/08/2013   Hyperlipidemia    Cancer (HCC)    Arthritis    Severe stage glaucoma 03/26/2012   Nuclear sclerosis 03/26/2012   Primary open angle glaucoma 10/27/2011   Essential hypertension 02/04/2011   Degenerative disc disease 02/04/2011   Gastrointestinal bleed 02/04/2011   Anemia 02/04/2011   Prostate cancer (HCC) 02/04/2011   PCP: Dr. Butler Der REFERRING PROVIDER: Dr. Butler Der  ONSET DATE: 08/07/23  REFERRING DIAG: P30.001,M46.8 (ICD-10-CM) - Weakness as late effect of cerebrovascular accident (CVA)   THERAPY DIAG:  Other lack  of coordination  Other symptoms and signs involving the nervous system  Rationale for Evaluation and Treatment: Rehabilitation  SUBJECTIVE:   SUBJECTIVE STATEMENT: S: Do you really think I'll get more back Pt accompanied by: self  PERTINENT HISTORY: Pt is an 83 y/o male who reports he had a stroke on September 19th. From [facility]. Left sided CVA impacting the R side.   PRECAUTIONS: Fall  WEIGHT BEARING RESTRICTIONS: No  PAIN:  Are you having pain? No  FALLS: Has patient fallen in last 6 months? No  PLOF: Independent  PATIENT GOALS: To get stronger and resume  independence in ADLs.   OBJECTIVE:  Note: Objective measures were completed at Evaluation unless otherwise noted.  HAND DOMINANCE: Right  ADLs: Overall ADLs: Pt reports difficulty with gripping items, pt is unable to use a toothbrush or razor, is using LUE for ADLs. Pt is unable to tie his shoes, has not tried to hold a pencil. Using left hand to feed himself.  Pt enjoys golfing, fishing, and yardwork.    FUNCTIONAL OUTCOME MEASURES: FOTO: 78/100   UPPER EXTREMITY ROM:      A/ROM of BUE is [facility]  UPPER EXTREMITY MMT:     MMT Right eval Right 12/07/23  Shoulder flexion 5/5 5/5  Shoulder abduction 4/5 (same as LUE) 4+/5  Shoulder internal rotation 5/5 5/5  Shoulder external rotation 5/5 5/5  Elbow flexion 5/5 5/5  Elbow extension 5/5 5/5  Wrist flexion 4/5 5/5  Wrist extension 3+/5 4/5  Wrist ulnar deviation 3+/5 5/5  Wrist radial deviation 3+/5 4+/5  Wrist pronation 5/5 5/5  Wrist supination 5/5 5/5  (Blank rows = not tested)  HAND FUNCTION: Grip strength: Right: 22 lbs; Left: 110 lbs, Lateral pinch: Right: 4 lbs, Left: 26 lbs, and 3 point pinch: Right: unable lbs, Left: 17 lbs 12/07/23: Grip strength: Right: 41 lbs; Lateral pinch: Right: 8 lbs, and 3 point pinch: Right: 6 lbs  COORDINATION: 9 Hole Peg test: Right: 1'48 with set-up sec; Left: 29.94 sec 12/07/23: 9 Hole Peg test: Right: 7'44 sec - on no slip mat  EDEMA: mild edema in hand  TODAY'S TREATMENT:                                                                                                                              DATE:   12/24/23 -Digit ROM: composite flexion, abduction/adduction, opposition, finger taps, x12 -Theraputty: red putty, roll into ball, flatten into pancake, PVC pipe cutting circles with wrist flexion/extension, roll into a log, tip to tip pinch x12, lateral pinch x12, roll into a ball, squeeze x12 -Gripper: 25# 10 medium beads, attempted 29# only able to pick up 1 medium bead -Checkers:  attempted picking up 2 and 3 at a time, unable to hold more than 1, max difficulty to pick up 1, mod difficulty to place it in the slot  12/21/23 -Perfection game: in hand translation holding 2 and placing them in correct  spots (max difficulty) -Nuts and bolts: unscrewing and screwing back 8 nuts onto the bolts of different sizes and threads -Digit ROM: composite flexion, abduction/adduction, opposition, finger taps, x12 -Digit Isolation: flexion/extension, each digit x10  12/18/23 -Wrist Strengthening: 3#, flexion, extension, ulnar/radial deviation, supination/pronation, x12 -Wrist ABC's, green weighted ball -Digit ROM: composite flexion, abduction/adduction, opposition, finger taps, x12 -Digit Isolation: flexion/extension, each digit x10 -Digiflex: 3#, full squeeze x12, each digit (max difficulty) x8 -Gripper: 25# 10 medium beads, attempted 29# only able to pick up 1 medium bead -Pinch coordination: red resistance clip, max difficulty to keep tip to tip pinch and pick up/stack 6 cubes  12/14/23 -Wrist strengthening: 3#, extension, 15 reps -Grip strengthening: hand gripper-large beads with gripper vertical for 3, horizontal for 2; 5 medium beads with gripper horizontal at 25#  -Red theraputty: flatten, cutting circles into putty using PVC pipe working on sustained grip with wrist flexion/extension -Chip manipulation: pt hold sequence chips in his palm, working on translating to fingertips to drop into container. Pt with max difficulty using digits and thumb versus compensating with shaking. Increased time and consistent verbal cuing, dropping chips each attempt -Sponges: 4, 5, 4   PATIENT EDUCATION: Education details: Continue HEP Person educated: Patient Education method: Explanation, Demonstration, and Handouts Education comprehension: verbalized understanding and returned demonstration  HOME EXERCISE PROGRAM: Eval: yellow theraputty-grip and pinch strengthening 12/17: Wrist and digit  ROM 12/27: pinch activities  1/24: Digit Isolation 1/31: Get chip clips and work on tip to tip pinch   GOALS: Goals reviewed with patient? Yes  SHORT TERM GOALS: Target date: 11/27/23  Pt will be provided with and educated on HEP to improve mobility and use of RUE required for ADL completion.   Goal status: IN PROGRESS  2.  Pt will increase RUE strength to 4+/5 to improve ability to perform yardwork and fishing activities using RUE as dominant.   Goal status: MET  3.  Pt will increase right grip strength by 30+ pounds and pinch strength by 10+ pounds to improve ability to grip and utilize tools such as a toothbrush or razor with the right hand.   Goal status: IN PROGRESS  4.  Pt will improve RUE coordination required for tying shoes and operate fishing gear by completing 9 hole peg test in under 1'.   Goal status: IN PROGRESS    ASSESSMENT:  CLINICAL IMPRESSION: Pt continuing to work on fine motor skills, as well as grip strengthening and hand manipulation. He continues to improve with overall gross motor mobility of the hand, moving all digits together, however with digit isolation, he continues to require mod assist to stop the other digits from moving along with the one being isolated. Attempted to improve grip strength this session, however he continues to have difficulty with closing the gripper in line rather than crooked, stopping him from picking up beads. Verbal and tactile cuing provided throughout session for positioning and technique, as well as periodic hands on assist to help with digit isolation and mobility.   PERFORMANCE DEFICITS: in functional skills including ADLs, IADLs, coordination, dexterity, edema, tone, ROM, strength, and UE functional use   PLAN:  OT FREQUENCY: 2x/week  OT DURATION: 4 weeks  PLANNED INTERVENTIONS: 97168 OT Re-evaluation, 97535 self care/ADL training, 02889 therapeutic exercise, 97530 therapeutic activity, 97112 neuromuscular  re-education, 97140 manual therapy, 97018 paraffin, 02967 electrical stimulation (manual), patient/family education, and DME and/or AE instructions  RECOMMENDED OTHER SERVICES: PT evaluation  CONSULTED AND AGREED WITH PLAN OF CARE: Patient  PLAN FOR NEXT SESSION: follow up on HEP, wrist and grip strengthening, coordination work   [employer], OTR/L [phone] 12/25/2023, 9:06 AM

## 2023-12-24 NOTE — ED Triage Notes (Signed)
 Pt fell this afternoon and now has head pain that goes from his neck area to his head since about 1:45pm today. Pt fel;l on his buttocks.

## 2023-12-24 NOTE — ED Notes (Signed)
 Patient transported to CT

## 2023-12-24 NOTE — ED Provider Notes (Signed)
 [place] [facility]PARTMENT AT [facility] Provider Note   CSN: [id number] Arrival date & time: 12/24/23  1614     History  Chief Complaint  Patient presents with   Joshua Compton. is a 83 y.o. male.   Fall  Patient on anticoagulation with fall.  Lost balance and fell striking the left side of his head.  Complaining of just pain on the left side of his head.  No loss conscious.  Slight left-sided neck pain.  No numbness or weakness.  Is on Eliquis .    Past Medical History:  Diagnosis Date   Arthritis    Cancer (HCC)    prostate ca   seed implants   GERD (gastroesophageal reflux disease)    Glaucoma    Hyperlipidemia    Hypertension    dr cyrena     [place]   fm   Stroke [facility]) 07/2023    Home Medications Prior to Admission medications   Medication Sig Start Date End Date Taking? Authorizing Provider  acetaminophen  (TYLENOL ) 500 MG tablet Take 1,000 mg by mouth every 6 (six) hours as needed for mild pain (pain score 1-3).   Yes [provider]  amiodarone  (PACERONE ) 200 MG tablet Take 1 tablet (200 mg total) by mouth 2 (two) times daily. X 20 days, then once daily starting 09/28/23 09/08/23  Yes Tat, Alm, MD  apixaban  (ELIQUIS ) 5 MG TABS tablet Take 1 tablet (5 mg total) by mouth 2 (two) times daily. 11/30/23  Yes Stacks, Butler, MD  atorvastatin  (LIPITOR) 40 MG tablet TAKE 1 TABLET BY MOUTH EVERYDAY AT BEDTIME 10/22/23  Yes Stacks, Butler, MD  cholecalciferol  (VITAMIN D3) 25 MCG (1000 UNIT) tablet TAKE 1 TABLET BY MOUTH EVERY DAY 09/29/23  Yes Zollie Butler, MD  dapagliflozin  propanediol (FARXIGA ) 10 MG TABS tablet Take 1 tablet (10 mg total) by mouth daily. 11/30/23  Yes Zollie Butler, MD  dorzolamide -timolol  (COSOPT ) 22.3-6.8 MG/ML ophthalmic solution Place 1 drop into both eyes daily.  08/04/16  Yes [provider]  fluticasone  (FLONASE ) 50 MCG/ACT nasal spray SPRAY 2 SPRAYS INTO EACH NOSTRIL EVERY DAY Patient taking  differently: Place 2 sprays into both nostrils daily as needed for allergies. 09/29/23  Yes Stacks, Butler, MD  furosemide  (LASIX ) 20 MG tablet Take 1 tablet (20 mg total) by mouth daily. To reduce swelling 11/30/23  Yes Stacks, Butler, MD  methocarbamol  (ROBAXIN ) 500 MG tablet Take 1 tablet (500 mg total) by mouth every 8 (eight) hours as needed. 12/24/23  Yes Patsey Lot, MD  metoprolol  succinate (TOPROL -XL) 25 MG 24 hr tablet Take 0.5 tablets (12.5 mg total) by mouth daily. For blood pressure control 10/01/23  Yes Miriam Norris, NP  pantoprazole  (PROTONIX ) 40 MG tablet Take 1 tablet (40 mg total) by mouth daily. For heartburn 07/28/23  Yes Stacks, Butler, MD  rOPINIRole  (REQUIP ) 1 MG tablet Take 1 tablet (1 mg total) by mouth 3 (three) times daily. Plus an extra tablet at bedtime to total four a day 11/30/23  Yes Stacks, Butler, MD  sacubitril -valsartan  (ENTRESTO ) 24-26 MG Take 1 tablet by mouth 2 (two) times daily. 11/30/23  Yes Zollie Butler, MD      Allergies    Iodinated contrast media, Multihance  [gadobenate], Pravastatin , and Pravastatin  sodium    Review of Systems   Review of Systems  Physical Exam Updated Vital Signs BP (!) 174/68 (BP Location: Left Arm)   Pulse (!) 58   Temp 97.8 F (36.6 C) (Oral)  Resp 18   Ht 6' (1.829 m)   Wt 94.1 kg   SpO2 99%   BMI 28.14 kg/m  Physical Exam Nursing note reviewed.  HENT:     Head: Normocephalic.     Comments: Some left temporal tenderness.  No large hematoma Chest:     Chest wall: No tenderness.  Abdominal:     Tenderness: There is no abdominal tenderness.  Musculoskeletal:     Cervical back: Neck supple. No tenderness.  Neurological:     Mental Status: He is oriented to person, place, and time. Mental status is at baseline.     ED Results / Procedures / Treatments   Labs (all labs ordered are listed, but only abnormal results are displayed) Labs Reviewed - No data to display  EKG None  Radiology CT Head Wo  Contrast Result Date: 12/24/2023 CLINICAL DATA:  Head trauma, minor, normal mental status (Age 42-64y); Neck trauma (Age >= 65y) EXAM: CT HEAD WITHOUT CONTRAST CT CERVICAL SPINE WITHOUT CONTRAST TECHNIQUE: Multidetector CT imaging of the head and cervical spine was performed following the standard protocol without intravenous contrast. Multiplanar CT image reconstructions of the cervical spine were also generated. RADIATION DOSE REDUCTION: This exam was performed according to the departmental dose-optimization program which includes automated exposure control, adjustment of the mA and/or kV according to patient size and/or use of iterative reconstruction technique. COMPARISON:  None Available. FINDINGS: CT HEAD FINDINGS Brain: No evidence of acute large vascular territory infarct, hemorrhage, hydrocephalus, extra-axial collection or mass lesion/mass effect. Remote left frontal infarct. Small remote left cerebellar infarct. Patchy white matter hypodensities, nonspecific but compatible with chronic microvascular disease. Vascular: Calcific atherosclerosis. Skull: No acute fracture. Sinuses/Orbits: Clear sinuses.  No acute orbital findings. Other: No mastoid effusions. CT CERVICAL SPINE FINDINGS Alignment: Mild anterolisthesis of C5 on C6, likely degenerative. Otherwise, no substantial sagittal subluxation. Skull base and vertebrae: No acute fracture. Vertebral body heights are maintained. C3-C5 ACDF. Mild lucency surrounded the C3 screws suggest possible mild loosening. No hardware fracture. No definite bony fusion. Soft tissues and spinal canal: No prevertebral fluid or swelling. No visible canal hematoma. Disc levels: Multilevel facet uncovertebral hypertrophy which contributes to varying degrees of neural foraminal stenosis, potentially moderate to severe at C5-C6 and C6-C7. Upper chest: The superior most lung apices are clear. Other: Partially imaged right thyroid  nodule, further characterized lobe ultrasound and  biopsies from 2018. IMPRESSION: CT head: 1. No evidence of acute intracranial abnormality. 2. Remote left frontal and cerebellar infarcts and chronic microvascular ischemic change. CT cervical spine: 1. No evidence of acute fracture or traumatic malalignment. 2. C3-C5 ACDF with possible mild loosing at C3. No definite bony fusion. 3. Multilevel degenerative change with potentially moderate to severe foraminal stenosis at C5-C6 and C6-C7. MRI cervical spine could further evaluate if clinically warranted. Electronically Signed   By: Gilmore GORMAN Molt M.D.   On: 12/24/2023 17:20   CT Cervical Spine Wo Contrast Result Date: 12/24/2023 CLINICAL DATA:  Head trauma, minor, normal mental status (Age 37-64y); Neck trauma (Age >= 65y) EXAM: CT HEAD WITHOUT CONTRAST CT CERVICAL SPINE WITHOUT CONTRAST TECHNIQUE: Multidetector CT imaging of the head and cervical spine was performed following the standard protocol without intravenous contrast. Multiplanar CT image reconstructions of the cervical spine were also generated. RADIATION DOSE REDUCTION: This exam was performed according to the departmental dose-optimization program which includes automated exposure control, adjustment of the mA and/or kV according to patient size and/or use of iterative reconstruction technique. COMPARISON:  None Available.  FINDINGS: CT HEAD FINDINGS Brain: No evidence of acute large vascular territory infarct, hemorrhage, hydrocephalus, extra-axial collection or mass lesion/mass effect. Remote left frontal infarct. Small remote left cerebellar infarct. Patchy white matter hypodensities, nonspecific but compatible with chronic microvascular disease. Vascular: Calcific atherosclerosis. Skull: No acute fracture. Sinuses/Orbits: Clear sinuses.  No acute orbital findings. Other: No mastoid effusions. CT CERVICAL SPINE FINDINGS Alignment: Mild anterolisthesis of C5 on C6, likely degenerative. Otherwise, no substantial sagittal subluxation. Skull base and  vertebrae: No acute fracture. Vertebral body heights are maintained. C3-C5 ACDF. Mild lucency surrounded the C3 screws suggest possible mild loosening. No hardware fracture. No definite bony fusion. Soft tissues and spinal canal: No prevertebral fluid or swelling. No visible canal hematoma. Disc levels: Multilevel facet uncovertebral hypertrophy which contributes to varying degrees of neural foraminal stenosis, potentially moderate to severe at C5-C6 and C6-C7. Upper chest: The superior most lung apices are clear. Other: Partially imaged right thyroid  nodule, further characterized lobe ultrasound and biopsies from 2018. IMPRESSION: CT head: 1. No evidence of acute intracranial abnormality. 2. Remote left frontal and cerebellar infarcts and chronic microvascular ischemic change. CT cervical spine: 1. No evidence of acute fracture or traumatic malalignment. 2. C3-C5 ACDF with possible mild loosing at C3. No definite bony fusion. 3. Multilevel degenerative change with potentially moderate to severe foraminal stenosis at C5-C6 and C6-C7. MRI cervical spine could further evaluate if clinically warranted. Electronically Signed   By: Gilmore GORMAN Molt M.D.   On: 12/24/2023 17:20    Procedures Procedures    Medications Ordered in ED Medications  methocarbamol  (ROBAXIN ) tablet 500 mg (500 mg Oral Given 12/24/23 1837)    ED Course/ Medical Decision Making/ A&P                                 Medical Decision Making Amount and/or Complexity of Data Reviewed Radiology: ordered.  Risk Prescription drug management.   Patient with fall.  Had previous stroke.  Hit head.  Slight lateral neck pain.  However will get head CT and cervical spine CT.  Differential diagnosis does include cause such as intracranial hemorrhage.  Does appear to be a mechanical fall.  Head CT reassuring.  Cervical spine CT reassuring.  Appears stable for discharge home.        Final Clinical Impression(s) / ED Diagnoses Final  diagnoses:  Fall, initial encounter  Injury of head, initial encounter  Muscle strain    Rx / DC Orders ED Discharge Orders          Ordered    methocarbamol  (ROBAXIN ) 500 MG tablet  Every 8 hours PRN        12/24/23 1832              Patsey Lot, MD 12/25/23 1448

## 2023-12-24 NOTE — ED Triage Notes (Signed)
 Pt arrived via POV from home after being advised to seek further evaluation by his PCP. Pt report she spun around too fas, lost his balance and fell backwards striking the back of his head. Pt reports previous stroke and is currently taking Eliquis . Pt reports he was on the floor for apprx 1 hr before help arrived to get him off of the floor.

## 2023-12-24 NOTE — ED Provider Notes (Signed)
 [facility]    CSN: [id number] Arrival date & time: 12/24/23  1500      History   Chief Complaint No chief complaint on file.   HPI Joshua Compton. is a 83 y.o. male.   The history is provided by the patient.   Patient brought in by his daughter for complaints of left-sided head pain after a fall that occurred earlier today.  Patient states that he was at home, and when he went to turn, he lost his balance, landing on his bottom.  He states that he may have rolled back and hit the backside of his head.  He denies loss of consciousness, dizziness, blurred vision, LE weakness or numbness.  Daughter further denies confusion, change in mental status, or new weakness.  Patient with history of CVA, he does have residual right-sided weakness after the stroke.  Patient is currently taking Eliquis .  Denies shoulder pain, pain to his buttocks, or back pain.  Past Medical History:  Diagnosis Date   Arthritis    Cancer (HCC)    prostate ca   seed implants   GERD (gastroesophageal reflux disease)    Glaucoma    Hyperlipidemia    Hypertension    dr cyrena     [place]   fm   Stroke (HCC) 07/2023    Patient Active Problem List   Diagnosis Date Noted   Centrilobular emphysema (HCC) 12/22/2023   High risk medication use 09/18/2023   Vitamin D  deficiency 09/18/2023   Supplemental oxygen  dependent 09/18/2023   Acute HFrEF (heart failure with reduced ejection fraction) (HCC) 09/08/2023   PAF (paroxysmal atrial fibrillation) (HCC) 09/07/2023   Acute respiratory failure with hypoxia (HCC) 09/05/2023   Acute heart failure with preserved ejection fraction (HFpEF) (HCC) 09/05/2023   Lobar pneumonia (HCC) 09/05/2023   Cerebrovascular accident (CVA) due to embolism of left middle cerebral artery (HCC) 08/10/2023   Paresthesia of both feet 07/28/2023   Gastroesophageal reflux disease with esophagitis without hemorrhage 09/10/2021   Primary insomnia 09/10/2021   Vasculogenic  erectile dysfunction 09/10/2021   Status post total replacement of left hip 01/07/2021   Primary osteoarthritis of left hip 01/06/2021   Leg cramps 12/11/2020   Thyromegaly 05/19/2017   Neoplasm of scalp 12/08/2013   Rhinitis 12/08/2013   Cough 12/08/2013   Hyperlipidemia    Cancer (HCC)    Arthritis    Severe stage glaucoma 03/26/2012   Nuclear sclerosis 03/26/2012   Primary open angle glaucoma 10/27/2011   Essential hypertension 02/04/2011   Degenerative disc disease 02/04/2011   Gastrointestinal bleed 02/04/2011   Anemia 02/04/2011   Prostate cancer (HCC) 02/04/2011    Past Surgical History:  Procedure Laterality Date   BACK SURGERY     x2   COLONOSCOPY     HERNIA REPAIR  11-06-2009   left inguinal repair    NECK SURGERY     ROTATOR CUFF REPAIR Left jan 2016   TOTAL HIP ARTHROPLASTY Right 04/20/2020   Procedure: RIGHT TOTAL HIP ARTHROPLASTY ANTERIOR APPROACH;  Surgeon: Jerri Kay HERO, MD;  Location: [facility] OR;  Service: Orthopedics;  Laterality: Right;   TOTAL HIP ARTHROPLASTY Left 01/07/2021   Procedure: LEFT TOTAL HIP ARTHROPLASTY ANTERIOR APPROACH;  Surgeon: Jerri Kay HERO, MD;  Location: [facility] OR;  Service: Orthopedics;  Laterality: Left;   TRANSURETHRAL RESECTION OF PROSTATE         Home Medications    Prior to Admission medications   Medication Sig Start Date End Date Taking? Authorizing Provider  albuterol  (VENTOLIN  HFA) 108 (90 Base) MCG/ACT inhaler Inhale 1-2 puffs into the lungs every 6 (six) hours as needed for wheezing or shortness of breath. 04/06/23   Suellen, Celeste A, PA-C  amiodarone  (PACERONE ) 200 MG tablet Take 1 tablet (200 mg total) by mouth 2 (two) times daily. X 20 days, then once daily starting 09/28/23 09/08/23   Evonnie Lenis, MD  apixaban  (ELIQUIS ) 5 MG TABS tablet Take 1 tablet (5 mg total) by mouth 2 (two) times daily. 11/30/23   Zollie Lowers, MD  atorvastatin  (LIPITOR) 40 MG tablet TAKE 1 TABLET BY MOUTH EVERYDAY AT BEDTIME 10/22/23   Zollie Lowers, MD   cholecalciferol  (VITAMIN D3) 25 MCG (1000 UNIT) tablet TAKE 1 TABLET BY MOUTH EVERY DAY 09/29/23   Zollie Lowers, MD  dapagliflozin  propanediol (FARXIGA ) 10 MG TABS tablet Take 1 tablet (10 mg total) by mouth daily. 11/30/23   Zollie Lowers, MD  dorzolamide -timolol  (COSOPT ) 22.3-6.8 MG/ML ophthalmic solution Place 1 drop into both eyes daily.  08/04/16   [provider]  fluticasone  (FLONASE ) 50 MCG/ACT nasal spray SPRAY 2 SPRAYS INTO EACH NOSTRIL EVERY DAY 09/29/23   Zollie Lowers, MD  furosemide  (LASIX ) 20 MG tablet Take 1 tablet (20 mg total) by mouth daily. To reduce swelling 11/30/23   Zollie Lowers, MD  metoprolol  succinate (TOPROL -XL) 25 MG 24 hr tablet Take 0.5 tablets (12.5 mg total) by mouth daily. For blood pressure control 10/01/23   Miriam Norris, NP  pantoprazole  (PROTONIX ) 40 MG tablet Take 1 tablet (40 mg total) by mouth daily. For heartburn 07/28/23   Zollie Lowers, MD  rOPINIRole  (REQUIP ) 1 MG tablet Take 1 tablet (1 mg total) by mouth 3 (three) times daily. Plus an extra tablet at bedtime to total four a day 11/30/23   Zollie Lowers, MD  sacubitril -valsartan  (ENTRESTO ) 24-26 MG Take 1 tablet by mouth 2 (two) times daily. 11/30/23   Zollie Lowers, MD    Family History Family History  Problem Relation Age of Onset   Cancer Mother        breast   Heart disease Father    Cancer Brother    Cancer Brother     Social History Social History   Tobacco Use   Smoking status: Former    Current packs/day: 0.00    Average packs/day: 0.5 packs/day for 30.0 years (15.0 ttl pk-yrs)    Types: Cigarettes    Start date: 03/29/1975    Quit date: 03/28/2005    Years since quitting: 18.7   Smokeless tobacco: Never  Vaping Use   Vaping status: Never Used  Substance Use Topics   Alcohol use: No    Alcohol/week: 0.0 standard drinks of alcohol   Drug use: No     Allergies   Iodinated contrast media, Multihance  [gadobenate], Pravastatin , and Pravastatin   sodium   Review of Systems Review of Systems Per HPI  Physical Exam Triage Vital Signs ED Triage Vitals  Encounter Vitals Group     BP 12/24/23 1541 (!) 190/76     Systolic BP Percentile --      Diastolic BP Percentile --      Pulse Rate 12/24/23 1541 (!) 57     Resp 12/24/23 1541 12     Temp 12/24/23 1541 97.8 F (36.6 C)     Temp Source 12/24/23 1541 Oral     SpO2 12/24/23 1541 94 %     Weight --      Height --      Head Circumference --  Peak Flow --      Pain Score 12/24/23 1544 9     Pain Loc --      Pain Education --      Exclude from Growth Chart --    No data found.  Updated Vital Signs BP (!) 190/76   Pulse (!) 57   Temp 97.8 F (36.6 C) (Oral)   Resp 12   SpO2 94%   Visual Acuity Right Eye Distance:   Left Eye Distance:   Bilateral Distance:    Right Eye Near:   Left Eye Near:    Bilateral Near:     Physical Exam Vitals and nursing note reviewed.  Constitutional:      General: He is not in acute distress.    Appearance: Normal appearance.  HENT:     Head: Normocephalic.     Comments: Moderate tenderness with palpation to left head and neck.  No redness or swelling present.  Mild bruising to the left occipital region of head. Eyes:     Extraocular Movements: Extraocular movements intact.     Pupils: Pupils are equal, round, and reactive to light.  Neck:     Comments: Moderate pain and tenderness to the left neck.  There is no swelling, redness, or bruising present. Cardiovascular:     Rate and Rhythm: Regular rhythm. Bradycardia present.     Pulses: Normal pulses.     Heart sounds: Normal heart sounds.  Pulmonary:     Effort: Pulmonary effort is normal. No respiratory distress.     Breath sounds: Normal breath sounds. No stridor. No wheezing, rhonchi or rales.  Abdominal:     General: Bowel sounds are normal.     Palpations: Abdomen is soft.     Tenderness: There is no abdominal tenderness.  Musculoskeletal:     Cervical back:  Normal range of motion.  Neurological:     General: No focal deficit present.     Mental Status: He is alert and oriented to person, place, and time.     GCS: GCS eye subscore is 4. GCS verbal subscore is 5. GCS motor subscore is 6.     Cranial Nerves: Cranial nerves 2-12 are intact.     Sensory: Sensation is intact.     Motor: Weakness (right side, residual post CVA) present.     Coordination: Coordination normal.     Gait: Gait is intact.  Psychiatric:        Mood and Affect: Mood normal.        Behavior: Behavior normal.      UC Treatments / Results  Labs (all labs ordered are listed, but only abnormal results are displayed) Labs Reviewed - No data to display  EKG   Radiology No results found.  Procedures Procedures (including critical care time)  Medications Ordered in UC Medications - No data to display  Initial Impression / Assessment and Plan / UC Course  I have reviewed the triage vital signs and the nursing notes.  Pertinent labs & imaging results that were available during my care of the patient were reviewed by me and considered in my medical decision making (see chart for details).  Patient brought in by his daughter after fall where there was suspicion that patient hit the back of his head.  Patient presents with moderate to severe left-sided head pain and neck pain.  Area is moderately tender to palpation.  There is no obvious swelling present.  Patient is currently on Eliquis  with prior  history of CVA.  He is also hypertensive.  Given patient's current presentation, and mechanism of injury, discussion with patient and his daughter that it is advised that they go to the emergency department for further evaluation.  Patient and daughter verbalized understanding.  Patient's vital signs are mostly stable, he is ambulatory, no new deficits were noted on exam.  Patient is able to travel via private vehicle.  Patient discharged to the emergency department.  Patient  ambulatory at discharge.  Final Clinical Impressions(s) / UC Diagnoses   Final diagnoses:  None   Discharge Instructions   None    ED Prescriptions   None    PDMP not reviewed this encounter.   Gilmer Etta PARAS, NP 12/24/23 1649

## 2023-12-28 ENCOUNTER — Encounter (HOSPITAL_COMMUNITY): Payer: PPO | Admitting: Occupational Therapy

## 2023-12-28 ENCOUNTER — Encounter (HOSPITAL_COMMUNITY): Payer: PPO | Admitting: Speech Pathology

## 2023-12-28 ENCOUNTER — Telehealth: Payer: Self-pay

## 2023-12-28 NOTE — Telephone Encounter (Signed)
 Copied from CRM [id number]. Topic: Clinical - Home Health Verbal Orders >> Sep 23, 2023 12:42 PM Lennie Ra H wrote: Reason for CRM: verbal orders for speech therapy

## 2023-12-28 NOTE — Transitions of Care (Post Inpatient/ED Visit) (Signed)
 12/28/2023  Name: Joshua Compton. MRN: [id number] DOB: [birth year]  Today's TOC FU Call Status: Today's TOC FU Call Status:: Successful TOC FU Call Completed TOC FU Call Complete Date: 12/28/23 Patient's Name and Date of Birth confirmed.  Transition Care Management Follow-up Telephone Call Date of Discharge: 12/24/23 Discharge [facility]: [facility] ([facility] Type of Discharge: Emergency Department Reason for ED Visit: Other: (Fall, Injury of head, Muscle strain) How have you been since you were released from the hospital?: Same (daughter reports patient is still complaining of head hurting very badly.) Any questions or concerns?: Yes Patient Questions/Concerns:: daughter concerned about head injury, states that patient is complaining of pain. Patient Questions/Concerns Addressed: Notified Provider of Patient Questions/Concerns  Items Reviewed: Did you receive and understand the discharge instructions provided?: Yes Medications obtained,verified, and reconciled?: Yes (Medications Reviewed) Any new allergies since your discharge?: No Dietary orders reviewed?: NA Do you have support at home?: Yes People in Home: child(ren), adult  Medications Reviewed Today: Medications Reviewed Today     Reviewed by Leigh Aurora, CMA (Certified Medical Assistant) on 12/28/23 at 1440  Med List Status: <None>   Medication Order Taking? Sig Documenting Provider Last Dose Status Informant  acetaminophen (TYLENOL) 500 MG tablet [id number] No Take 1,000 mg by mouth every 6 (six) hours as needed for mild pain (pain score 1-3). [provider] 12/24/2023 Noon Active Self, Child  amiodarone (PACERONE) 200 MG tablet [id number] No Take 1 tablet (200 mg total) by mouth 2 (two) times daily. X 20 days, then once daily starting 09/28/23 Catarina Hartshorn, MD 12/20/2023 Active Self, Child           Med Note (WARD, ANGELICA G   Thu Dec 24, 2023  6:20 PM) Pt ran out of refills and Dr denied refill request?   apixaban (ELIQUIS) 5 MG TABS tablet [id number] No Take 1 tablet (5 mg total) by mouth 2 (two) times daily. [name], MD 12/24/2023  7:45 AM Active Self, Child  atorvastatin (LIPITOR) 40 MG tablet [id number] No TAKE 1 TABLET BY MOUTH EVERYDAY AT BEDTIME [name], MD 12/23/2023 Bedtime Active Self, Child  cholecalciferol (VITAMIN D3) 25 MCG (1000 UNIT) tablet [id number] No TAKE 1 TABLET BY MOUTH EVERY Lowella Fairy, MD 12/24/2023 Morning Active Self, Child  dapagliflozin propanediol (FARXIGA) 10 MG TABS tablet [id number] No Take 1 tablet (10 mg total) by mouth daily. [name], MD 12/24/2023 Morning Active Self, Child  dorzolamide-timolol (COSOPT) 22.3-6.8 MG/ML ophthalmic solution [id number] No Place 1 drop into both eyes daily.  [provider] 12/23/2023 Bedtime Active Self, Child           Med Note Kai Levins, MELISSA B   Tue Apr 10, 2020  3:15 PM)    fluticasone (FLONASE) 50 MCG/ACT nasal spray [id number] No SPRAY 2 SPRAYS INTO EACH NOSTRIL EVERY DAY  Patient taking differently: Place 2 sprays into both nostrils daily as needed for allergies.   [name], MD Past Week Active Self, Child  furosemide (LASIX) 20 MG tablet [id number] No Take 1 tablet (20 mg total) by mouth daily. To reduce swelling [name], MD 12/24/2023 Morning Active Self, Child  methocarbamol (ROBAXIN) 500 MG tablet [id number]  Take 1 tablet (500 mg total) by mouth every 8 (eight) hours as needed. Benjiman Core, MD  Active   metoprolol succinate (TOPROL-XL) 25 MG 24 hr tablet [id number] No Take 0.5 tablets (12.5 mg total) by mouth daily. For blood pressure control Sharlene Dory, NP 12/24/2023 Morning Active Self, Child  pantoprazole (  PROTONIX) 40 MG tablet [id number] No Take 1 tablet (40 mg total) by mouth daily. For heartburn [name], MD 12/24/2023 Morning Active Self, Child  rOPINIRole (REQUIP) 1 MG tablet [id number] No Take 1 tablet (1 mg total) by mouth 3 (three) times daily. Plus an extra tablet at  bedtime to total four a day [name], MD 12/24/2023 Noon Active Self, Child  sacubitril-valsartan (ENTRESTO) 24-26 MG [id number] No Take 1 tablet by mouth 2 (two) times daily. [name], MD 12/24/2023 Morning Active Self, Child            Home Care and Equipment/Supplies: Were Home Health Services Ordered?: No Any new equipment or medical supplies ordered?: No  Functional Questionnaire: Do you need assistance with bathing/showering or dressing?: No Do you need assistance with meal preparation?: No Do you need assistance with eating?: No Do you have difficulty maintaining continence: No Do you need assistance with getting out of bed/getting out of a chair/moving?: No Do you have difficulty managing or taking your medications?: No  Follow up appointments reviewed: PCP Follow-up appointment confirmed?: Yes Date of PCP follow-up appointment?: 01/07/24 Follow-up Provider: [name], MD Specialist Hospital Follow-up appointment confirmed?: NA Do you need transportation to your follow-up appointment?: No Do you understand care options if your condition(s) worsen?: Yes-patient verbalized understanding    SIGNATURE Agnes Lawrence, CMA (AAMA)  [facility] [phone]

## 2023-12-29 ENCOUNTER — Encounter (HOSPITAL_BASED_OUTPATIENT_CLINIC_OR_DEPARTMENT_OTHER): Payer: PPO

## 2023-12-31 ENCOUNTER — Encounter (HOSPITAL_COMMUNITY): Payer: PPO | Admitting: Occupational Therapy

## 2024-01-04 ENCOUNTER — Encounter (HOSPITAL_COMMUNITY): Payer: Self-pay | Admitting: Occupational Therapy

## 2024-01-04 ENCOUNTER — Ambulatory Visit (HOSPITAL_COMMUNITY): Payer: PPO | Admitting: Occupational Therapy

## 2024-01-04 ENCOUNTER — Encounter (HOSPITAL_COMMUNITY): Payer: Self-pay | Admitting: Speech Pathology

## 2024-01-04 ENCOUNTER — Ambulatory Visit (HOSPITAL_COMMUNITY): Payer: PPO | Admitting: Speech Pathology

## 2024-01-04 DIAGNOSIS — R471 Dysarthria and anarthria: Secondary | ICD-10-CM | POA: Diagnosis not present

## 2024-01-04 DIAGNOSIS — R278 Other lack of coordination: Secondary | ICD-10-CM

## 2024-01-04 DIAGNOSIS — R29818 Other symptoms and signs involving the nervous system: Secondary | ICD-10-CM

## 2024-01-04 NOTE — Therapy (Signed)
OUTPATIENT SPEECH LANGUAGE PATHOLOGY TREATMENT   Patient Name: Joshua Compton. MRN: [id number] DOB:July 25, 1941, 83 y.o., male 45 Date: 01/04/2024  PCP: [name], MD REFERRING PROVIDER: [name], MD  END OF SESSION:  End of Session - 01/04/24 0949     Visit Number 4    Number of Visits 5    Date for SLP Re-Evaluation 01/14/24    Authorization Type [employer]   PER AVAILTY (AETNA) EFF 11/17/20 DED MET $4500 OOP ($3,473.00) $20.00 COPAY NO AUTH REQ   SLP Start Time 0930    SLP Stop Time  1015    SLP Time Calculation (min) 45 min    Activity Tolerance Patient tolerated treatment well             Past Medical History:  Diagnosis Date   Arthritis    Cancer (HCC)    prostate ca   seed implants   GERD (gastroesophageal reflux disease)    Glaucoma    Hyperlipidemia    Hypertension    dr [place] Charon     [place]   fm   Stroke [facility]) 07/2023   Past Surgical History:  Procedure Laterality Date   BACK SURGERY     x2   COLONOSCOPY     HERNIA REPAIR  11-06-2009   left inguinal repair    NECK SURGERY     ROTATOR CUFF REPAIR Left jan 2016   TOTAL HIP ARTHROPLASTY Right 04/20/2020   Procedure: RIGHT TOTAL HIP ARTHROPLASTY ANTERIOR APPROACH;  Surgeon: Tarry Kos, MD;  Location: [facility] OR;  Service: Orthopedics;  Laterality: Right;   TOTAL HIP ARTHROPLASTY Left 01/07/2021   Procedure: LEFT TOTAL HIP ARTHROPLASTY ANTERIOR APPROACH;  Surgeon: Tarry Kos, MD;  Location: [facility] OR;  Service: Orthopedics;  Laterality: Left;   TRANSURETHRAL RESECTION OF PROSTATE     Patient Active Problem List   Diagnosis Date Noted   Centrilobular emphysema (HCC) 12/22/2023   High risk medication use 09/18/2023   Vitamin D deficiency 09/18/2023   Supplemental oxygen dependent 09/18/2023   Acute HFrEF (heart failure with reduced ejection fraction) (HCC) 09/08/2023   PAF (paroxysmal atrial fibrillation) (HCC) 09/07/2023   Acute respiratory failure with hypoxia (HCC) 09/05/2023    Acute heart failure with preserved ejection fraction (HFpEF) (HCC) 09/05/2023   Lobar pneumonia (HCC) 09/05/2023   Cerebrovascular accident (CVA) due to embolism of left middle cerebral artery (HCC) 08/10/2023   Paresthesia of both feet 07/28/2023   Gastroesophageal reflux disease with esophagitis without hemorrhage 09/10/2021   Primary insomnia 09/10/2021   Vasculogenic erectile dysfunction 09/10/2021   Status post total replacement of left hip 01/07/2021   Primary osteoarthritis of left hip 01/06/2021   Leg cramps 12/11/2020   Thyromegaly 05/19/2017   Neoplasm of scalp 12/08/2013   Rhinitis 12/08/2013   Cough 12/08/2013   Hyperlipidemia    Cancer (HCC)    Arthritis    Severe stage glaucoma 03/26/2012   Nuclear sclerosis 03/26/2012   Primary open angle glaucoma 10/27/2011   Essential hypertension 02/04/2011   Degenerative disc disease 02/04/2011   Gastrointestinal bleed 02/04/2011   Anemia 02/04/2011   Prostate cancer (HCC) 02/04/2011    ONSET DATE: 08/07/2023  REFERRING DIAG: R29.810 (ICD-10-CM) - Weakness on right side of face I69.390 (ICD-10-CM) - 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 The patient was admitted to the hospital from 08/07/2023 to 08/14/2023. He was transferred to [facility] from [facility] for right-sided weakness and aphasia. The patient was found to have an acute left frontal lobe infarct that was felt to be cryptogenic. CTA of the head and neck was negative for LVO. Echocardiogram showed EF 50-55%, no WMA, normal diastolic function, normal RV function, trace MR/TR, mild AI, no AS. He was discharged to inpatient rehab where  he stayed from 08/14/2023 to 08/24/2023. He was discharged with aspirin 81 mg daily and Lipitor 40 mg daily for secondary prophylaxis. He has since returned home to live with his daughter and spouse.  PAIN:  Are you having pain? No  FALLS: Has patient fallen in last 6 months?  No  LIVING ENVIRONMENT: Lives with: lives with their family and lives with their spouse Lives in: House/apartment  PLOF:  Level of assistance: Independent with ADLs, Independent with IADLs Employment: Retired  PATIENT GOALS: Improve voice, speech, and his right hand  OBJECTIVE:  Note: Objective measures were completed at Evaluation unless otherwise noted.  DIAGNOSTIC FINDINGS:  MRI Head WO Contrast 08/07/23 1. Acute to subacute infarction within the posterior aspect of the left frontal lobe. Small amount of associated petechial hemorrhage.   PATIENT EDUCATION: Education details: dysarthria treatment strategies-over articulate, reduce rate, swallow 2x/minute, and avoid clearing throat, words to practice provided Person educated: Patient Education method: Explanation, Demonstration, and Handouts Education comprehension: verbalized understanding  TODAY'S TREATMENT:  He did not bring his folder this session. He was seen after his OT session and he reports he still has questions about his bill (missing a $20 payment per his report). SLP asked Pt what he focuses on in order to maximize speech intelligibility and initially was unable to answer. SLP had him read aloud his list of strategies (speak loud, slow down, move lips, swallow often) and once he did that, he stated that he focuses on "slowing down all the time". Speech intelligibility seems most improved when he focuses on increasing loudness and when cued to "say it like you mean it". He orally read his list of personally relevant words/phrases with 95% intelligibility with one cue for increasing loudness. SLP targeted speech intelligibility in conversation and when  answering open ended questions to spark topics and Pt implemented strategies with indirect cues for greater than 95% intelligibility. Pt missed session last week and he is being rescheduled for this Thursday. Pt reportedly fell on 12/24/2023 and hit his head and was seen at the ER and had head CT. He still has significant tenderness and he indicates some swelling bilaterally (parietal lobe region) and has had to loosen his hat in order to wear. He denies changes in thinking. SLP encouraged him to continue to monitor and to go to the ER if it becomes worse or if he notes changes in thinking (this was also written down for him to share with family). He states that the tenderness and swelling is getting better and not worse. He will also see his PCP Thursday afternoon. Continue to target goals and likely D/C after next session.   DATE: 01/04/24  GOALS: Goals reviewed with patient? Yes  SHORT TERM GOALS: Target date: 01/13/2024  Pt will implement fluency enhancing and speech intelligibility strategies when generating sentences involving multisyllabic words with 90% acc and min assist. Baseline: mi/mod assist Goal status: ONGOING  2.  Pt will utilize speech intelligibility strategies (over articulation, reduced speaking rate, and vocal intensity) at the conversation level with 90% acc and min cues. Baseline: ~75% intelligible Goal status: ONGOING  3.  Pt will Decrease chronic cough by 80% via reduction in laryngeal hypersensitivity. a. Sip liquid instead of coughing  b. Follow reflux recommendations (if applicable) c. Increase hydration to a minimum of 48+oz of water d. Improve breath support during speech (not speaking to end of breath) Baseline: Pt cleared throat frequently throughout the evaluation and reports taking reflux medication with breakfast Goal status: ONGOING  LONG TERM GOALS: Same as short term goals  ASSESSMENT:  CLINICAL IMPRESSION: Patient is an 83 y.o. male who was seen today  for a cognitive linguistic evaluation s/p CVA 08/07/2023.  Pt lives with his spouse who has mild dementia. He was independent with managing finances and medications. Patient presents with a mild dysarthria c/b decreased speech intelligibility, reduced articulatory precision, and low volume speech. Patient was ~75% intelligible in conversation. Basic expressive and receptive language skills appear WFL. Confrontation naming, repetition, and automatic speech tasks were completed with 100% accuracy. Two step directives were followed independently. Basic yes/no questions were answered with 100% accuracy. Pt scored a 24/30 on the [facility] SLUMS with deficits in mental calculation, story recall, and attention. Pt is motivated to improve his speech intelligibility and resume previously enjoyed activities (golfing, fishing, working on his car, gardening, and Microbiologist). He has excellent family support.  OBJECTIVE IMPAIRMENTS: include dysarthria. These impairments are limiting patient from effectively communicating at home and in community. Factors affecting potential to achieve goals and functional outcome are  consideration of GERD impacting voice . Patient will benefit from skilled SLP services to address above impairments and improve overall function.  REHAB POTENTIAL: Excellent  PLAN:  SLP FREQUENCY: 1x/week  SLP DURATION: 4 weeks  PLANNED INTERVENTIONS: [phone]- [address], Artic, Phon, Eval Compre, Express, [phone] Treatment of speech (30 or 45 min) , Cueing hierachy, Oral motor exercises, SLP instruction and feedback, Compensatory strategies, and Patient/family education   Thank you,  Havery Moros, CCC-SLP [phone]  Allix Blomquist, CCC-SLP 01/04/2024, 9:55 AM

## 2024-01-04 NOTE — Therapy (Signed)
OUTPATIENT OCCUPATIONAL THERAPY NEURO TREATMENT NOTE  Patient Name: Joshua Compton. MRN: [id number] DOB:10-16-1941, 83 y.o., male Today's Date: 01/04/2024   END OF SESSION:  OT End of Session - 01/04/24 0951     Visit Number 12    Number of Visits 14    Date for OT Re-Evaluation 01/08/24    Authorization Type [employer], $20 copay    Authorization Time Period no auth required    Progress Note Due on Visit 20    OT Start Time 0849    OT Stop Time 0929    OT Time Calculation (min) 40 min    Activity Tolerance Patient tolerated treatment well    Behavior During Therapy WFL for tasks assessed/performed              Past Medical History:  Diagnosis Date   Arthritis    Cancer (HCC)    prostate ca   seed implants   GERD (gastroesophageal reflux disease)    Glaucoma    Hyperlipidemia    Hypertension    dr [place] Charon     [facility]   fm   Stroke [facility]) 07/2023   Past Surgical History:  Procedure Laterality Date   BACK SURGERY     x2   COLONOSCOPY     HERNIA REPAIR  11-06-2009   left inguinal repair    NECK SURGERY     ROTATOR CUFF REPAIR Left jan 2016   TOTAL HIP ARTHROPLASTY Right 04/20/2020   Procedure: RIGHT TOTAL HIP ARTHROPLASTY ANTERIOR APPROACH;  Surgeon: Tarry Kos, MD;  Location: [facility] OR;  Service: Orthopedics;  Laterality: Right;   TOTAL HIP ARTHROPLASTY Left 01/07/2021   Procedure: LEFT TOTAL HIP ARTHROPLASTY ANTERIOR APPROACH;  Surgeon: Tarry Kos, MD;  Location: [facility] OR;  Service: Orthopedics;  Laterality: Left;   TRANSURETHRAL RESECTION OF PROSTATE     Patient Active Problem List   Diagnosis Date Noted   Centrilobular emphysema (HCC) 12/22/2023   High risk medication use 09/18/2023   Vitamin D deficiency 09/18/2023   Supplemental oxygen dependent 09/18/2023   Acute HFrEF (heart failure with reduced ejection fraction) (HCC) 09/08/2023   PAF (paroxysmal atrial fibrillation) (HCC) 09/07/2023   Acute respiratory failure with hypoxia (HCC) 09/05/2023    Acute heart failure with preserved ejection fraction (HFpEF) (HCC) 09/05/2023   Lobar pneumonia (HCC) 09/05/2023   Cerebrovascular accident (CVA) due to embolism of left middle cerebral artery (HCC) 08/10/2023   Paresthesia of both feet 07/28/2023   Gastroesophageal reflux disease with esophagitis without hemorrhage 09/10/2021   Primary insomnia 09/10/2021   Vasculogenic erectile dysfunction 09/10/2021   Status post total replacement of left hip 01/07/2021   Primary osteoarthritis of left hip 01/06/2021   Leg cramps 12/11/2020   Thyromegaly 05/19/2017   Neoplasm of scalp 12/08/2013   Rhinitis 12/08/2013   Cough 12/08/2013   Hyperlipidemia    Cancer (HCC)    Arthritis    Severe stage glaucoma 03/26/2012   Nuclear sclerosis 03/26/2012   Primary open angle glaucoma 10/27/2011   Essential hypertension 02/04/2011   Degenerative disc disease 02/04/2011   Gastrointestinal bleed 02/04/2011   Anemia 02/04/2011   Prostate cancer (HCC) 02/04/2011   PCP: Dr. [name] REFERRING PROVIDER: Dr. [name]  ONSET DATE: 08/07/23  REFERRING DIAG: I69.998,R53.1 (ICD-10-CM) - Weakness as late effect of cerebrovascular accident (CVA)   THERAPY DIAG:  Other lack of coordination  Other symptoms and signs involving the nervous system  Rationale for Evaluation and Treatment: Rehabilitation  SUBJECTIVE:  SUBJECTIVE STATEMENT: S: "It's doing alright it just ain't working like it ought to."  PERTINENT HISTORY: Pt is an 83 y/o male who reports he had a stroke on September 19th. From [facility]. Left sided CVA impacting the R side.   PRECAUTIONS: Fall  WEIGHT BEARING RESTRICTIONS: No  PAIN:  Are you having pain? No  FALLS: Has patient fallen in last 6 months? No  PLOF: Independent  PATIENT GOALS: To get stronger and resume independence in ADLs.   OBJECTIVE:  Note: Objective measures were completed at Evaluation unless otherwise noted.  HAND DOMINANCE:  Right  ADLs: Overall ADLs: Pt reports difficulty with gripping items, pt is unable to use a toothbrush or razor, is using LUE for ADLs. Pt is unable to tie his shoes, has not tried to hold a pencil. Using left hand to feed himself.  Pt enjoys golfing, fishing, and yardwork.    FUNCTIONAL OUTCOME MEASURES: FOTO: 78/100   UPPER EXTREMITY ROM:      A/ROM of BUE is [facility]  UPPER EXTREMITY MMT:     MMT Right eval Right 12/07/23  Shoulder flexion 5/5 5/5  Shoulder abduction 4/5 (same as LUE) 4+/5  Shoulder internal rotation 5/5 5/5  Shoulder external rotation 5/5 5/5  Elbow flexion 5/5 5/5  Elbow extension 5/5 5/5  Wrist flexion 4/5 5/5  Wrist extension 3+/5 4/5  Wrist ulnar deviation 3+/5 5/5  Wrist radial deviation 3+/5 4+/5  Wrist pronation 5/5 5/5  Wrist supination 5/5 5/5  (Blank rows = not tested)  HAND FUNCTION: Grip strength: Right: 22 lbs; Left: 110 lbs, Lateral pinch: Right: 4 lbs, Left: 26 lbs, and 3 point pinch: Right: unable lbs, Left: 17 lbs 12/07/23: Grip strength: Right: 41 lbs; Lateral pinch: Right: 8 lbs, and 3 point pinch: Right: 6 lbs  COORDINATION: 9 Hole Peg test: Right: 1'48" with set-up sec; Left: 29.94 sec 12/07/23: 9 Hole Peg test: Right: 7'44" sec - on no slip mat  EDEMA: mild edema in hand  TODAY'S TREATMENT:                                                                                                                              DATE:  01/04/24 -A/ROM: opposition to 4th and 5th digits with mod assist from OT to facilitate touching fingertips versus side of fingers, 10 reps -A/ROM: digit abduction/adduction, 10 reps -Towel crumple: working on individual finger mobility versus composite, 10 reps -Grip strengthening: large beads hand gripper horizontal at 29#, 1 medium bead hand gripper vertical at 29#, remaining medium beads at 25# -Fine motor task: pt using index finger to slide sequence chips to edge of table, pick up in tip pinch, then move to  palm. Pt holding chips in palm and isolating index finger and thumb to slide and pick up next chip. Mod to max difficulty isolating index finger once holding chips with remaining fingers, however completing the task using a lateral pinch versus a tip  pinch. Transitioned to pennies, sliding to edge of table, grasping in tip pinch, then placing in slotted container. Mod difficulty with holding the smaller pennies in a tip pinch. Completed 5 of each-chips and pennies.  -Large pegboard: pt grasping pegs with right hand, working to place into vertical large pegboard. Mod difficulty grasping and holding in lateral pinch, increased time and multiple trials to place 5 pegs  12/24/23 -Digit ROM: composite flexion, abduction/adduction, opposition, finger taps, x12 -Theraputty: red putty, roll into ball, flatten into pancake, PVC pipe cutting circles with wrist flexion/extension, roll into a log, tip to tip pinch x12, lateral pinch x12, roll into a ball, squeeze x12 -Gripper: 25# 10 medium beads, attempted 29# only able to pick up 1 medium bead -Checkers: attempted picking up 2 and 3 at a time, unable to hold more than 1, max difficulty to pick up 1, mod difficulty to place it in the slot  12/21/23 -Perfection game: in hand translation holding 2 and placing them in correct spots (max difficulty) -Nuts and bolts: unscrewing and screwing back 8 nuts onto the bolts of different sizes and threads -Digit ROM: composite flexion, abduction/adduction, opposition, finger taps, x12 -Digit Isolation: flexion/extension, each digit x10    PATIENT EDUCATION: Education details: Continue HEP Person educated: Patient Education method: Programmer, multimedia, Demonstration, and Handouts Education comprehension: verbalized understanding and returned demonstration  HOME EXERCISE PROGRAM: Eval: yellow theraputty-grip and pinch strengthening 12/17: Wrist and digit ROM 12/27: pinch activities  1/24: Digit Isolation 1/31: Get "chip  clips" and work on tip to tip pinch   GOALS: Goals reviewed with patient? Yes  SHORT TERM GOALS: Target date: 11/27/23  Pt will be provided with and educated on HEP to improve mobility and use of RUE required for ADL completion.   Goal status: IN PROGRESS  2.  Pt will increase RUE strength to 4+/5 to improve ability to perform yardwork and fishing activities using RUE as dominant.   Goal status: MET  3.  Pt will increase right grip strength by 30+ pounds and pinch strength by 10+ pounds to improve ability to grip and utilize tools such as a toothbrush or razor with the right hand.   Goal status: IN PROGRESS  4.  Pt will improve RUE coordination required for tying shoes and operate fishing gear by completing 9 hole peg test in under 1'.   Goal status: IN PROGRESS    ASSESSMENT:  CLINICAL IMPRESSION: Session focusing on motor planning and isolating digits for task completion. Pt with mod to max difficulty with index finger isolation, however with increased time and concentration was able to complete tasks. Increased hand gripper resistance to 29# for large beads, attempted medium however unable to complete. Added penny manipulation and large pegboard activity focusing on digit isolation and in-hand manipulation. Verbal cuing for form and technique, rest breaks provided as needed.   PERFORMANCE DEFICITS: in functional skills including ADLs, IADLs, coordination, dexterity, edema, tone, ROM, strength, and UE functional use   PLAN:  OT FREQUENCY: 2x/week  OT DURATION: 4 weeks  PLANNED INTERVENTIONS: 97168 OT Re-evaluation, 97535 self care/ADL training, 78295 therapeutic exercise, 97530 therapeutic activity, 97112 neuromuscular re-education, 97140 manual therapy, 97018 paraffin, 62130 electrical stimulation (manual), patient/family education, and DME and/or AE instructions  RECOMMENDED OTHER SERVICES: PT evaluation  CONSULTED AND AGREED WITH PLAN OF CARE: Patient  PLAN FOR NEXT  SESSION: follow up on HEP, wrist and grip strengthening, coordination work, grasp activities   Ezra Sites, OTR/L  [phone] 01/04/2024, 9:52 AM

## 2024-01-07 ENCOUNTER — Ambulatory Visit (INDEPENDENT_AMBULATORY_CARE_PROVIDER_SITE_OTHER): Payer: PPO | Admitting: Family Medicine

## 2024-01-07 ENCOUNTER — Ambulatory Visit (HOSPITAL_COMMUNITY): Payer: Self-pay | Admitting: Speech Pathology

## 2024-01-07 ENCOUNTER — Other Ambulatory Visit: Payer: Self-pay | Admitting: Nurse Practitioner

## 2024-01-07 ENCOUNTER — Encounter (HOSPITAL_COMMUNITY): Payer: Self-pay | Admitting: Occupational Therapy

## 2024-01-07 ENCOUNTER — Ambulatory Visit (HOSPITAL_COMMUNITY): Payer: PPO | Admitting: Occupational Therapy

## 2024-01-07 ENCOUNTER — Encounter: Payer: Self-pay | Admitting: Family Medicine

## 2024-01-07 ENCOUNTER — Encounter (HOSPITAL_COMMUNITY): Payer: Self-pay | Admitting: Speech Pathology

## 2024-01-07 VITALS — BP 141/50 | HR 53 | Temp 97.5°F | Ht 72.0 in | Wt 206.2 lb

## 2024-01-07 DIAGNOSIS — R471 Dysarthria and anarthria: Secondary | ICD-10-CM | POA: Diagnosis not present

## 2024-01-07 DIAGNOSIS — S0003XD Contusion of scalp, subsequent encounter: Secondary | ICD-10-CM

## 2024-01-07 DIAGNOSIS — R29818 Other symptoms and signs involving the nervous system: Secondary | ICD-10-CM

## 2024-01-07 DIAGNOSIS — R278 Other lack of coordination: Secondary | ICD-10-CM

## 2024-01-07 DIAGNOSIS — S0003XA Contusion of scalp, initial encounter: Secondary | ICD-10-CM

## 2024-01-07 MED ORDER — TRAMADOL HCL 50 MG PO TABS: ORAL_TABLET | ORAL | 1 refills | Status: DC

## 2024-01-07 MED ORDER — BENZONATATE 200 MG PO CAPS: 200.0000 mg | ORAL_CAPSULE | Freq: Three times a day (TID) | ORAL | 0 refills | Status: DC | PRN

## 2024-01-07 NOTE — Therapy (Signed)
OUTPATIENT SPEECH LANGUAGE PATHOLOGY TREATMENT   Patient Name: Joshua Compton. MRN: [id number] DOB:1941/05/16, 83 y.o., male 52 Date: 01/07/2024  PCP: Joshua Claude, MD REFERRING PROVIDER: [name], MD  END OF SESSION:  End of Session - 01/07/24 1101     Visit Number 5    Number of Visits 5    Date for SLP Re-Evaluation 01/14/24    Authorization Type [employer]   PER AVAILTY ([employer]ET[employer]) EFF 11/17/20 DED MET $4500 OOP ($3,473.00) $20.00 COPAY NO AUTH REQ   SLP Start Time 1022    SLP Stop Time  1107    SLP Time Calculation (min) 45 min    Activity Tolerance Patient tolerated treatment well             Past Medical History:  Diagnosis Date   Arthritis    Cancer (HCC)    prostate ca   seed implants   GERD (gastroesophageal reflux disease)    Glaucoma    Hyperlipidemia    Hypertension    Joshua Compton     [place]   fm   Stroke [facility]) 07/2023   Past Surgical History:  Procedure Laterality Date   BACK SURGERY     x2   COLONOSCOPY     HERNIA REPAIR  11-06-2009   left inguinal repair    NECK SURGERY     ROTATOR CUFF REPAIR Left jan 2016   TOTAL HIP ARTHROPLASTY Right 04/20/2020   Procedure: RIGHT TOTAL HIP ARTHROPLASTY ANTERIOR APPROACH;  Surgeon: Joshua Kos, MD;  Location: [facility] OR;  Service: Orthopedics;  Laterality: Right;   TOTAL HIP ARTHROPLASTY Left 01/07/2021   Procedure: LEFT TOTAL HIP ARTHROPLASTY ANTERIOR APPROACH;  Surgeon: Joshua Kos, MD;  Location: [facility] OR;  Service: Orthopedics;  Laterality: Left;   TRANSURETHRAL RESECTION OF PROSTATE     Patient Active Problem List   Diagnosis Date Noted   Centrilobular emphysema (HCC) 12/22/2023   High risk medication use 09/18/2023   Vitamin D deficiency 09/18/2023   Supplemental oxygen dependent 09/18/2023   Acute HFrEF (heart failure with reduced ejection fraction) (HCC) 09/08/2023   PAF (paroxysmal atrial fibrillation) (HCC) 09/07/2023   Acute respiratory failure with hypoxia (HCC) 09/05/2023    Acute heart failure with preserved ejection fraction (HFpEF) (HCC) 09/05/2023   Lobar pneumonia (HCC) 09/05/2023   Cerebrovascular accident (CVA) due to embolism of left middle cerebral artery (HCC) 08/10/2023   Paresthesia of both feet 07/28/2023   Gastroesophageal reflux disease with esophagitis without hemorrhage 09/10/2021   Primary insomnia 09/10/2021   Vasculogenic erectile dysfunction 09/10/2021   Status post total replacement of left hip 01/07/2021   Primary osteoarthritis of left hip 01/06/2021   Leg cramps 12/11/2020   Thyromegaly 05/19/2017   Neoplasm of scalp 12/08/2013   Rhinitis 12/08/2013   Cough 12/08/2013   Hyperlipidemia    Cancer (HCC)    Arthritis    Severe stage glaucoma 03/26/2012   Nuclear sclerosis 03/26/2012   Primary open angle glaucoma 10/27/2011   Essential hypertension 02/04/2011   Degenerative disc disease 02/04/2011   Gastrointestinal bleed 02/04/2011   Anemia 02/04/2011   Prostate cancer (HCC) 02/04/2011    ONSET DATE: 08/07/2023  REFERRING DIAG: R29.810 (ICD-10-CM) - Weakness on right side of face I69.390 (ICD-10-CM) - 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 The patient was admitted to the hospital from 08/07/2023 to 08/14/2023. He was transferred to [facility] from [facility] for right-sided weakness and aphasia. The patient was found to have an acute left frontal lobe infarct that was felt to be cryptogenic. CTA of the head and neck was negative for LVO. Echocardiogram showed EF 50-55%, no WMA, normal diastolic function, normal RV function, trace MR/TR, mild AI, no AS. He was discharged to inpatient  rehab where he stayed from 08/14/2023 to 08/24/2023. He was discharged with aspirin 81 mg daily and Lipitor 40 mg daily for secondary prophylaxis. He has since returned home to live with his daughter and spouse.  PAIN:  Are you having pain? No  FALLS: Has patient fallen in last 6 months?  No  LIVING ENVIRONMENT: Lives with: lives with their family and lives with their spouse Lives in: House/apartment  PLOF:  Level of assistance: Independent with ADLs, Independent with IADLs Employment: Retired  PATIENT GOALS: Improve voice, speech, and his right hand  OBJECTIVE:  Note: Objective measures were completed at Evaluation unless otherwise noted.  DIAGNOSTIC FINDINGS:  MRI Head WO Contrast 08/07/23 1. Acute to subacute infarction within the posterior aspect of the left frontal lobe. Small amount of associated petechial hemorrhage.   PATIENT EDUCATION: Education details: dysarthria treatment strategies-over articulate, reduce rate, swallow 2x/minute, and avoid clearing throat, words to practice provided Person educated: Patient Education method: Explanation, Demonstration, and Handouts Education comprehension: verbalized understanding  TODAY'S TREATMENT:  He did not bring his folder this session. He was seen after his OT session and indicated that he will have OT for another four weeks. He is eager to improve movement with his right hand. He would like to be able to golf and fish again. He states that his family asks him, "Why are you hollering at [place]?". SLP recorded Pt using three methods of speech intelligibility strategies: slow down, over articulate, and increase loudness/intent. Pt then listened and stated that he felt that the first and last methods sounded the best to him. SLP encouraged him to think of "say it like you mean it" and speaking with intent over increasing loudness. SLP pointed out that while his volume is adequate 70% of the time, he trails off at the end of his sentences at  time. SLP targeted speech intelligibility in conversation and when answering open ended questions to spark topics and Pt implemented strategies with indirect cues for greater than 95% intelligibility. Pt is pleased with his speech and progress and is ready for d/c from SLP services. He has an appointment with his PCP today and he was encouraged to discuss his recent fall with him. Pt will be discharged from SLP and continue with OT.   DATE: 01/07/24  GOALS: Goals reviewed with patient? Yes  SHORT TERM GOALS: Target date: 01/13/2024  Pt will implement fluency enhancing and speech intelligibility strategies when generating sentences involving multisyllabic words with 90% acc and min assist. Baseline: mi/mod assist Goal status: MET  2.  Pt will utilize speech intelligibility strategies (over articulation, reduced speaking rate, and vocal intensity) at the conversation level with 90% acc and min cues. Baseline: ~75% intelligible Goal status: MET  3.  Pt will Decrease chronic cough by 80% via reduction in laryngeal hypersensitivity. a. Sip liquid instead of coughing  b. Follow reflux recommendations (if applicable) c. Increase hydration to a minimum of  48+oz of water d. Improve breath support during speech (not speaking to end of breath) Baseline: Pt cleared throat frequently throughout the evaluation and reports taking reflux medication with breakfast Goal status: MET  LONG TERM GOALS: Same as short term goals  ASSESSMENT:  CLINICAL IMPRESSION: Patient is an 83 y.o. male who was seen today for a cognitive linguistic evaluation s/p CVA 08/07/2023.  Pt lives with his spouse who has mild dementia. He was independent with managing finances and medications. Patient presents with a mild dysarthria c/b decreased speech intelligibility, reduced articulatory precision, and low volume speech. Patient was ~75% intelligible in conversation. Basic expressive and receptive language skills appear WFL.  Confrontation naming, repetition, and automatic speech tasks were completed with 100% accuracy. Two step directives were followed independently. Basic yes/no questions were answered with 100% accuracy. Pt scored a 24/30 on the [facility] SLUMS with deficits in mental calculation, story recall, and attention. Pt is motivated to improve his speech intelligibility and resume previously enjoyed activities (golfing, fishing, working on his car, gardening, and Microbiologist). He has excellent family support.  OBJECTIVE IMPAIRMENTS: include dysarthria. These impairments are limiting patient from effectively communicating at home and in community. Factors affecting potential to achieve goals and functional outcome are  consideration of GERD impacting voice . Patient will benefit from skilled SLP services to address above impairments and improve overall function.  REHAB POTENTIAL: Excellent  PLAN:  SLP FREQUENCY:  D/C from SLP services  PLANNED INTERVENTIONS: [phone]- Speech [address], Artic, Phon, Eval Compre, Express, [phone] Treatment of speech (30 or 45 min) , Cueing hierachy, Oral motor exercises, SLP instruction and feedback, Compensatory strategies, and Patient/family education  SPEECH THERAPY DISCHARGE SUMMARY  Visits from Start of Care: 5  Current functional level related to goals / functional outcomes: Goals met; see above   Remaining deficits: Mild dysarthria   Education / Equipment: Pt to continue with the following strategies: slow down and "say it like you mean it"/intent   Patient agrees to discharge. Patient goals were met. Patient is being discharged due to meeting the stated rehab goals..    Thank you,  Havery Moros, CCC-SLP [phone]  Damonie Furney, CCC-SLP 01/07/2024, 11:10 AM

## 2024-01-07 NOTE — Progress Notes (Signed)
 Subjective:  Patient ID: Joshua Najjar., male    DOB: Dec 12, 1940  Age: 83 y.o. MRN: [id number]  CC: Fall Larey Seat 2/6 went to ER because he hit his head)   HPI Joshua Compton. presents for recent fall. Hit head,  painful at the right posterior scalp. No other pains reported. Family denies him being obtunded, disoriented. Had scan of head, neck at [facility] Report reviewed. No LOC reported. Landed on his knees and then fell backward, so head did not receive full impact.      10/19/2023    1:08 PM 07/28/2023    8:57 AM 04/30/2023    2:17 PM  Depression screen PHQ 2/9  Decreased Interest 0 0 0  Down, Depressed, Hopeless 0 0 0  PHQ - 2 Score 0 0 0    History Joshua Compton has a past medical history of Arthritis, Cancer (HCC), GERD (gastroesophageal reflux disease), Glaucoma, Hyperlipidemia, Hypertension, and Stroke (HCC) (07/2023).   He has a past surgical history that includes Back surgery; Neck surgery; Transurethral resection of prostate; Hernia repair (11-06-2009); Rotator cuff repair (Left, jan 2016); Colonoscopy; Total hip arthroplasty (Right, 04/20/2020); and Total hip arthroplasty (Left, 01/07/2021).   His family history includes Cancer in his brother, brother, and mother; Heart disease in his father.He reports that he quit smoking about 18 years ago. His smoking use included cigarettes. He started smoking about 48 years ago. He has a 15 pack-year smoking history. He has never used smokeless tobacco. He reports that he does not drink alcohol and does not use drugs.    ROS Review of Systems  Constitutional:  Negative for fever.  Respiratory:  Positive for cough (feels like there is phlegm that won't come up). Negative for shortness of breath.   Cardiovascular:  Negative for chest pain.  Musculoskeletal:  Negative for arthralgias.  Skin:  Negative for rash.    Objective:  BP (!) 141/50   Pulse (!) 53   Temp (!) 97.5 F (36.4 C) (Temporal)   Ht 6' (1.829 m)   Wt 206 lb 3.2  oz (93.5 kg)   SpO2 93%   BMI 27.97 kg/m   BP Readings from Last 3 Encounters:  01/07/24 (!) 141/50  12/24/23 (!) 174/68  12/24/23 (!) 190/76    Wt Readings from Last 3 Encounters:  01/07/24 206 lb 3.2 oz (93.5 kg)  12/24/23 207 lb 8 oz (94.1 kg)  12/22/23 207 lb 8 oz (94.1 kg)     Physical Exam Vitals reviewed.  Constitutional:      Appearance: He is well-developed.  HENT:     Head: Normocephalic and atraumatic.     Right Ear: External ear normal.     Left Ear: External ear normal.     Mouth/Throat:     Pharynx: No oropharyngeal exudate or posterior oropharyngeal erythema.  Eyes:     Pupils: Pupils are equal, round, and reactive to light.  Cardiovascular:     Rate and Rhythm: Normal rate and regular rhythm.     Heart sounds: No murmur heard. Pulmonary:     Effort: No respiratory distress.     Breath sounds: Normal breath sounds.  Musculoskeletal:     Cervical back: Normal range of motion and neck supple.  Skin:    Findings: Lesion (right postauricular scalp mildly tender and 1+edema) present.  Neurological:     Mental Status: He is alert and oriented to person, place, and time.       Assessment & Plan:   Joshua Compton  was seen today for fall.  Diagnoses and all orders for this visit:  Contusion of scalp, initial encounter  Other orders -     traMADol (ULTRAM) 50 MG tablet; Use at bedtime for pain in the back of the head that prevents sleep. -     benzonatate (TESSALON) 200 MG capsule; Take 1 capsule (200 mg total) by mouth 3 (three) times daily as needed for cough.       I am having Joshua Najjar. start on traMADol and benzonatate. I am also having him maintain his dorzolamide-timolol, pantoprazole, amiodarone, fluticasone, cholecalciferol, atorvastatin, apixaban, Entresto, dapagliflozin propanediol, rOPINIRole, furosemide, acetaminophen, and methocarbamol.  Allergies as of 01/07/2024       Reactions   Iodinated Contrast Media Anaphylaxis    Tongue swelled up when he had MRI in [facility] Intubated for airway protection after oral and throat swelling   Multihance [gadobenate] Swelling   Tongue swelled up when he had MRI in [facility]   Pravastatin Other (See Comments)   Muscle aches   Pravastatin Sodium    Muscle aches        Medication List        Accurate as of January 07, 2024 11:59 PM. If you have any questions, ask your nurse or doctor.          acetaminophen 500 MG tablet Commonly known as: TYLENOL Take 1,000 mg by mouth every 6 (six) hours as needed for mild pain (pain score 1-3).   amiodarone 200 MG tablet Commonly known as: PACERONE Take 1 tablet (200 mg total) by mouth 2 (two) times daily. X 20 days, then once daily starting 09/28/23   apixaban 5 MG Tabs tablet Commonly known as: Eliquis Take 1 tablet (5 mg total) by mouth 2 (two) times daily.   atorvastatin 40 MG tablet Commonly known as: LIPITOR TAKE 1 TABLET BY MOUTH EVERYDAY AT BEDTIME   benzonatate 200 MG capsule Commonly known as: TESSALON Take 1 capsule (200 mg total) by mouth 3 (three) times daily as needed for cough. Started by: Waneta Fitting   cholecalciferol 25 MCG (1000 UNIT) tablet Commonly known as: VITAMIN D3 TAKE 1 TABLET BY MOUTH EVERY DAY   dapagliflozin propanediol 10 MG Tabs tablet Commonly known as: Farxiga Take 1 tablet (10 mg total) by mouth daily.   dorzolamide-timolol 2-0.5 % ophthalmic solution Commonly known as: COSOPT Place 1 drop into both eyes daily.   Entresto 24-26 MG Generic drug: sacubitril-valsartan Take 1 tablet by mouth 2 (two) times daily.   fluticasone 50 MCG/ACT nasal spray Commonly known as: FLONASE SPRAY 2 SPRAYS INTO EACH NOSTRIL EVERY DAY What changed: See the new instructions.   furosemide 20 MG tablet Commonly known as: LASIX Take 1 tablet (20 mg total) by mouth daily. To reduce swelling   methocarbamol 500 MG tablet Commonly known as: ROBAXIN Take 1 tablet (500 mg total) by  mouth every 8 (eight) hours as needed.   metoprolol succinate 25 MG 24 hr tablet Commonly known as: TOPROL-XL TAKE 0.5 TABLETS (12.5 MG TOTAL) BY MOUTH DAILY. FOR BLOOD PRESSURE CONTROL   pantoprazole 40 MG tablet Commonly known as: PROTONIX Take 1 tablet (40 mg total) by mouth daily. For heartburn   rOPINIRole 1 MG tablet Commonly known as: REQUIP Take 1 tablet (1 mg total) by mouth 3 (three) times daily. Plus an extra tablet at bedtime to total four a day   traMADol 50 MG tablet Commonly known as: ULTRAM Use at bedtime for pain in the back  of the head that prevents sleep. Started by: Broadus John Kobe Jansma         Follow-up: Return in about 6 weeks (around 02/18/2024) for CHF.  [name], M.D.

## 2024-01-07 NOTE — Therapy (Signed)
OUTPATIENT OCCUPATIONAL THERAPY NEURO TREATMENT NOTE  Patient Name: Joshua Compton. MRN: [id number] DOB:[birth year], 83 y.o., male Today's Date: 01/07/2024   END OF SESSION:  OT End of Session - 01/07/24 1029     Visit Number 13    Number of Visits 14    Date for OT Re-Evaluation 01/08/24    Authorization Type [employer], $20 copay    Authorization Time Period no auth required    Progress Note Due on Visit 20    OT Start Time 0940   pt arrived late   OT Stop Time 1016    OT Time Calculation (min) 36 min    Activity Tolerance Patient tolerated treatment well    Behavior During Therapy WFL for tasks assessed/performed               Past Medical History:  Diagnosis Date   Arthritis    Cancer (HCC)    prostate ca   seed implants   GERD (gastroesophageal reflux disease)    Glaucoma    Hyperlipidemia    Hypertension    dr Joshua Compton     [place]   fm   Stroke (HCC) 07/2023   Past Surgical History:  Procedure Laterality Date   BACK SURGERY     x2   COLONOSCOPY     HERNIA REPAIR  11-06-2009   left inguinal repair    NECK SURGERY     ROTATOR CUFF REPAIR Left jan 2016   TOTAL HIP ARTHROPLASTY Right 04/20/2020   Procedure: RIGHT TOTAL HIP ARTHROPLASTY ANTERIOR APPROACH;  Surgeon: Joshua Kos, MD;  Location: [facility] OR;  Service: Orthopedics;  Laterality: Right;   TOTAL HIP ARTHROPLASTY Left 01/07/2021   Procedure: LEFT TOTAL HIP ARTHROPLASTY ANTERIOR APPROACH;  Surgeon: Joshua Kos, MD;  Location: [facility] OR;  Service: Orthopedics;  Laterality: Left;   TRANSURETHRAL RESECTION OF PROSTATE     Patient Active Problem List   Diagnosis Date Noted   Centrilobular emphysema (HCC) 12/22/2023   High risk medication use 09/18/2023   Vitamin D deficiency 09/18/2023   Supplemental oxygen dependent 09/18/2023   Acute HFrEF (heart failure with reduced ejection fraction) (HCC) 09/08/2023   PAF (paroxysmal atrial fibrillation) (HCC) 09/07/2023   Acute respiratory failure with  hypoxia (HCC) 09/05/2023   Acute heart failure with preserved ejection fraction (HFpEF) (HCC) 09/05/2023   Lobar pneumonia (HCC) 09/05/2023   Cerebrovascular accident (CVA) due to embolism of left middle cerebral artery (HCC) 08/10/2023   Paresthesia of both feet 07/28/2023   Gastroesophageal reflux disease with esophagitis without hemorrhage 09/10/2021   Primary insomnia 09/10/2021   Vasculogenic erectile dysfunction 09/10/2021   Status post total replacement of left hip 01/07/2021   Primary osteoarthritis of left hip 01/06/2021   Leg cramps 12/11/2020   Thyromegaly 05/19/2017   Neoplasm of scalp 12/08/2013   Rhinitis 12/08/2013   Cough 12/08/2013   Hyperlipidemia    Cancer (HCC)    Arthritis    Severe stage glaucoma 03/26/2012   Nuclear sclerosis 03/26/2012   Primary open angle glaucoma 10/27/2011   Essential hypertension 02/04/2011   Degenerative disc disease 02/04/2011   Gastrointestinal bleed 02/04/2011   Anemia 02/04/2011   Prostate cancer (HCC) 02/04/2011   PCP: Dr. Mechele Compton REFERRING PROVIDER: Dr. Mechele Compton  ONSET DATE: 08/07/23  REFERRING DIAG: N56.213,Y86.5 (ICD-10-CM) - Weakness as late effect of cerebrovascular accident (CVA)   THERAPY DIAG:  Other lack of coordination  Other symptoms and signs involving the nervous system  Rationale for Evaluation and  Treatment: Rehabilitation  SUBJECTIVE:   SUBJECTIVE STATEMENT: S: "  PERTINENT HISTORY: Pt is an 83 y/o male who reports he had a stroke on September 19th. From [facility]. Left sided CVA impacting the R side.   PRECAUTIONS: Fall  WEIGHT BEARING RESTRICTIONS: No  PAIN:  Are you having pain? No  FALLS: Has patient fallen in last 6 months? No  PLOF: Independent  PATIENT GOALS: To get stronger and resume independence in ADLs.   OBJECTIVE:  Note: Objective measures were completed at Evaluation unless otherwise noted.  HAND DOMINANCE: Right  ADLs: Overall ADLs: Pt reports difficulty  with gripping items, pt is unable to use a toothbrush or razor, is using LUE for ADLs. Pt is unable to tie his shoes, has not tried to hold a pencil. Using left hand to feed himself.  Pt enjoys golfing, fishing, and yardwork.    FUNCTIONAL OUTCOME MEASURES: FOTO: 78/100   UPPER EXTREMITY ROM:      A/ROM of BUE is [facility]  UPPER EXTREMITY MMT:     MMT Right eval Right 12/07/23  Shoulder flexion 5/5 5/5  Shoulder abduction 4/5 (same as LUE) 4+/5  Shoulder internal rotation 5/5 5/5  Shoulder external rotation 5/5 5/5  Elbow flexion 5/5 5/5  Elbow extension 5/5 5/5  Wrist flexion 4/5 5/5  Wrist extension 3+/5 4/5  Wrist ulnar deviation 3+/5 5/5  Wrist radial deviation 3+/5 4+/5  Wrist pronation 5/5 5/5  Wrist supination 5/5 5/5  (Blank rows = not tested)  HAND FUNCTION: Grip strength: Right: 22 lbs; Left: 110 lbs, Lateral pinch: Right: 4 lbs, Left: 26 lbs, and 3 point pinch: Right: unable lbs, Left: 17 lbs 12/07/23: Grip strength: Right: 41 lbs; Lateral pinch: Right: 8 lbs, and 3 point pinch: Right: 6 lbs  COORDINATION: 9 Hole Peg test: Right: 1'48" with set-up sec; Left: 29.94 sec 12/07/23: 9 Hole Peg test: Right: 7'44" sec - on no slip mat  EDEMA: mild edema in hand  TODAY'S TREATMENT:                                                                                                                              DATE:  01/07/24 -Theraputty: weightshifting while flattening putty, 2 rounds, ~3 minutes each -Grip strengthening: all medium beads hand gripper horizontal at 25# -Sponges: working on isolating index finger while holding sponges in gross grasp-3, 5, 5 -Large pegboard: pt grasping pegs with right hand, working to place into vertical large pegboard. Mod difficulty grasping and holding in lateral pinch, increased time, was able to place 16 pegs today -Theraputty: flatten, using pvc pipe to cut circles into theraputty, 3 point pinch  01/04/24 -A/ROM: opposition to 4th and 5th  digits with mod assist from OT to facilitate touching fingertips versus side of fingers, 10 reps -A/ROM: digit abduction/adduction, 10 reps -Towel crumple: working on individual finger mobility versus composite, 10 reps -Grip strengthening: large beads hand gripper horizontal at 29#, 1 medium  bead hand gripper vertical at 29#, remaining medium beads at 25# -Fine motor task: pt using index finger to slide sequence chips to edge of table, pick up in tip pinch, then move to palm. Pt holding chips in palm and isolating index finger and thumb to slide and pick up next chip. Mod to max difficulty isolating index finger once holding chips with remaining fingers, however completing the task using a lateral pinch versus a tip pinch. Transitioned to pennies, sliding to edge of table, grasping in tip pinch, then placing in slotted container. Mod difficulty with holding the smaller pennies in a tip pinch. Completed 5 of each-chips and pennies.  -Large pegboard: pt grasping pegs with right hand, working to place into vertical large pegboard. Mod difficulty grasping and holding in lateral pinch, increased time and multiple trials to place 5 pegs  12/24/23 -Digit ROM: composite flexion, abduction/adduction, opposition, finger taps, x12 -Theraputty: red putty, roll into ball, flatten into pancake, PVC pipe cutting circles with wrist flexion/extension, roll into a log, tip to tip pinch x12, lateral pinch x12, roll into a ball, squeeze x12 -Gripper: 25# 10 medium beads, attempted 29# only able to pick up 1 medium bead -Checkers: attempted picking up 2 and 3 at a time, unable to hold more than 1, max difficulty to pick up 1, mod difficulty to place it in the slot    PATIENT EDUCATION: Education details: Continue HEP Person educated: Patient Education method: Programmer, multimedia, Demonstration, and Handouts Education comprehension: verbalized understanding and returned demonstration  HOME EXERCISE PROGRAM: Eval: yellow  theraputty-grip and pinch strengthening 12/17: Wrist and digit ROM 12/27: pinch activities  1/24: Digit Isolation 1/31: Get "chip clips" and work on tip to tip pinch   GOALS: Goals reviewed with patient? Yes  SHORT TERM GOALS: Target date: 11/27/23  Pt will be provided with and educated on HEP to improve mobility and use of RUE required for ADL completion.   Goal status: IN PROGRESS  2.  Pt will increase RUE strength to 4+/5 to improve ability to perform yardwork and fishing activities using RUE as dominant.   Goal status: MET  3.  Pt will increase right grip strength by 30+ pounds and pinch strength by 10+ pounds to improve ability to grip and utilize tools such as a toothbrush or razor with the right hand.   Goal status: IN PROGRESS  4.  Pt will improve RUE coordination required for tying shoes and operate fishing gear by completing 9 hole peg test in under 1'.   Goal status: IN PROGRESS    ASSESSMENT:  CLINICAL IMPRESSION: Session focusing on motor planning and isolating digits for task completion. Pt with improvement in digit isolation as noted with isolating index finger to successfully grasp sponges, mod difficulty today versus max. Also noting improvement with large pegboard activity as pt was able to place 16 pegs today compared to 5 at previous session. Verbal cuing for form and technique, rest breaks provided as needed.   PERFORMANCE DEFICITS: in functional skills including ADLs, IADLs, coordination, dexterity, edema, tone, ROM, strength, and UE functional use   PLAN:  OT FREQUENCY: 2x/week  OT DURATION: 4 weeks  PLANNED INTERVENTIONS: 97168 OT Re-evaluation, 97535 self care/ADL training, 96045 therapeutic exercise, 97530 therapeutic activity, 97112 neuromuscular re-education, 97140 manual therapy, 97018 paraffin, 40981 electrical stimulation (manual), patient/family education, and DME and/or AE instructions  RECOMMENDED OTHER SERVICES: PT  evaluation  CONSULTED AND AGREED WITH PLAN OF CARE: Patient  PLAN FOR NEXT SESSION: REASSESSMENT & RECERTIFICATION  Ezra Sites, OTR/L  [phone] 01/07/2024, 10:30 AM

## 2024-01-11 ENCOUNTER — Ambulatory Visit: Payer: PPO | Admitting: Family Medicine

## 2024-01-13 ENCOUNTER — Ambulatory Visit (HOSPITAL_COMMUNITY): Payer: PPO | Admitting: Occupational Therapy

## 2024-01-13 ENCOUNTER — Encounter (HOSPITAL_COMMUNITY): Payer: Self-pay | Admitting: Occupational Therapy

## 2024-01-13 DIAGNOSIS — R471 Dysarthria and anarthria: Secondary | ICD-10-CM | POA: Diagnosis not present

## 2024-01-13 DIAGNOSIS — R278 Other lack of coordination: Secondary | ICD-10-CM

## 2024-01-13 DIAGNOSIS — R29818 Other symptoms and signs involving the nervous system: Secondary | ICD-10-CM

## 2024-01-13 NOTE — Therapy (Unsigned)
 OUTPATIENT OCCUPATIONAL THERAPY NEURO TREATMENT NOTE  Patient Name: Joshua Compton. MRN: [id number] DOB:Sep 26, 1941, 83 y.o., male Today's Date: 01/14/2024  Progress Note Reporting Period 12/24/23 to 01/13/24  See note below for Objective Data and Assessment of Progress/Goals.    END OF SESSION:  OT End of Session - 01/13/24 1227     Visit Number 14    Number of Visits 22    Date for OT Re-Evaluation 01/08/24    Authorization Type [employer], $20 copay    Authorization Time Period no auth required    Progress Note Due on Visit 20    OT Start Time 1148    OT Stop Time 1227    OT Time Calculation (min) 39 min    Activity Tolerance Patient tolerated treatment well    Behavior During Therapy WFL for tasks assessed/performed             Past Medical History:  Diagnosis Date   Arthritis    Cancer (HCC)    prostate ca   seed implants   GERD (gastroesophageal reflux disease)    Glaucoma    Hyperlipidemia    Hypertension    dr Modesto Charon     [facility]   fm   Stroke [facility]) 07/2023   Past Surgical History:  Procedure Laterality Date   BACK SURGERY     x2   COLONOSCOPY     HERNIA REPAIR  11-06-2009   left inguinal repair    NECK SURGERY     ROTATOR CUFF REPAIR Left jan 2016   TOTAL HIP ARTHROPLASTY Right 04/20/2020   Procedure: RIGHT TOTAL HIP ARTHROPLASTY ANTERIOR APPROACH;  Surgeon: Tarry Kos, MD;  Location: [facility] OR;  Service: Orthopedics;  Laterality: Right;   TOTAL HIP ARTHROPLASTY Left 01/07/2021   Procedure: LEFT TOTAL HIP ARTHROPLASTY ANTERIOR APPROACH;  Surgeon: Tarry Kos, MD;  Location: [facility] OR;  Service: Orthopedics;  Laterality: Left;   TRANSURETHRAL RESECTION OF PROSTATE     Patient Active Problem List   Diagnosis Date Noted   Centrilobular emphysema (HCC) 12/22/2023   High risk medication use 09/18/2023   Vitamin D deficiency 09/18/2023   Supplemental oxygen dependent 09/18/2023   Acute HFrEF (heart failure with reduced ejection fraction) (HCC)  09/08/2023   PAF (paroxysmal atrial fibrillation) (HCC) 09/07/2023   Acute respiratory failure with hypoxia (HCC) 09/05/2023   Acute heart failure with preserved ejection fraction (HFpEF) (HCC) 09/05/2023   Lobar pneumonia (HCC) 09/05/2023   Cerebrovascular accident (CVA) due to embolism of left middle cerebral artery (HCC) 08/10/2023   Paresthesia of both feet 07/28/2023   Gastroesophageal reflux disease with esophagitis without hemorrhage 09/10/2021   Primary insomnia 09/10/2021   Vasculogenic erectile dysfunction 09/10/2021   Status post total replacement of left hip 01/07/2021   Primary osteoarthritis of left hip 01/06/2021   Leg cramps 12/11/2020   Thyromegaly 05/19/2017   Neoplasm of scalp 12/08/2013   Rhinitis 12/08/2013   Cough 12/08/2013   Hyperlipidemia    Cancer (HCC)    Arthritis    Severe stage glaucoma 03/26/2012   Nuclear sclerosis 03/26/2012   Primary open angle glaucoma 10/27/2011   Essential hypertension 02/04/2011   Degenerative disc disease 02/04/2011   Gastrointestinal bleed 02/04/2011   Anemia 02/04/2011   Prostate cancer (HCC) 02/04/2011   PCP: Dr. [name] REFERRING PROVIDER: Dr. [name]  ONSET DATE: 08/07/23  REFERRING DIAG: W09.811,B14.7 (ICD-10-CM) - Weakness as late effect of cerebrovascular accident (CVA)   THERAPY DIAG:  Other lack of coordination  Other symptoms and signs involving the nervous system  Rationale for Evaluation and Treatment: Rehabilitation  SUBJECTIVE:   SUBJECTIVE STATEMENT: S: "I think everything is getting a little better"  PERTINENT HISTORY: Pt is an 83 y/o male who reports he had a stroke on September 19th. From [facility]. Left sided CVA impacting the R side.   PRECAUTIONS: Fall  WEIGHT BEARING RESTRICTIONS: No  PAIN:  Are you having pain? No  FALLS: Has patient fallen in last 6 months? No  PLOF: Independent  PATIENT GOALS: To get stronger and resume independence in ADLs.   OBJECTIVE:   Note: Objective measures were completed at Evaluation unless otherwise noted.  HAND DOMINANCE: Right  ADLs: Overall ADLs: Pt reports difficulty with gripping items, pt is unable to use a toothbrush or razor, is using LUE for ADLs. Pt is unable to tie his shoes, has not tried to hold a pencil. Using left hand to feed himself.  Pt enjoys golfing, fishing, and yardwork.    FUNCTIONAL OUTCOME MEASURES: FOTO: 78/100 01/13/24: 88.71/100  UPPER EXTREMITY ROM:      A/ROM of BUE is [facility]  UPPER EXTREMITY MMT:     MMT Right eval Right 12/07/23 Right 01/13/24  Shoulder flexion 5/5 5/5 5/5  Shoulder abduction 4/5 (same as LUE) 4+/5 5/5  Shoulder internal rotation 5/5 5/5 5/5  Shoulder external rotation 5/5 5/5 5/5  Elbow flexion 5/5 5/5 5/5  Elbow extension 5/5 5/5 5/5  Wrist flexion 4/5 5/5 5/5  Wrist extension 3+/5 4/5 4+/5  Wrist ulnar deviation 3+/5 5/5 5/5  Wrist radial deviation 3+/5 4+/5 4+/5  Wrist pronation 5/5 5/5 5/5  Wrist supination 5/5 5/5 5/5  (Blank rows = not tested)  HAND FUNCTION: Grip strength: Right: 22 lbs; Left: 110 lbs, Lateral pinch: Right: 4 lbs, Left: 26 lbs, and 3 point pinch: Right: unable lbs, Left: 17 lbs 12/07/23: Grip strength: Right: 41 lbs; Lateral pinch: Right: 8 lbs, and 3 point pinch: Right: 6 lbs 01/13/24: Grip strength: Right: 46 lbs; Lateral pinch: Right: 9 lbs, and 3 point pinch: Right: 6 lbs  COORDINATION: 9 Hole Peg test: Right: 1'48" with set-up sec; Left: 29.94 sec 12/07/23: 9 Hole Peg test: Right: 7'44" sec - on no slip mat 01/13/24: 9 Hole Peg test: Right: 4'55" sec - on no slip mat  EDEMA: mild edema in hand  TODAY'S TREATMENT:                                                                                                                              DATE:   01/13/24 -A/ROM: 1#, flexion, abduction, horizontal abduction, er/IR, x10 -Wrist Strengthening: 4#, flexion, extension, ulnar/radial deviation, supination/pronation,  x12 -Checkers: holding 2 at a time and placing them in the connect four slots, transitioned to 1 at a time as pt's hand fatigued -9 Hole Peg Test -Measurements for reassessment  01/07/24 -Theraputty: weightshifting while flattening putty, 2 rounds, ~3 minutes each -Grip strengthening: all  medium beads hand gripper horizontal at 25# -Sponges: working on isolating index finger while holding sponges in gross grasp-3, 5, 5 -Large pegboard: pt grasping pegs with right hand, working to place into vertical large pegboard. Mod difficulty grasping and holding in lateral pinch, increased time, was able to place 16 pegs today -Theraputty: flatten, using pvc pipe to cut circles into theraputty, 3 point pinch  01/04/24 -A/ROM: opposition to 4th and 5th digits with mod assist from OT to facilitate touching fingertips versus side of fingers, 10 reps -A/ROM: digit abduction/adduction, 10 reps -Towel crumple: working on individual finger mobility versus composite, 10 reps -Grip strengthening: large beads hand gripper horizontal at 29#, 1 medium bead hand gripper vertical at 29#, remaining medium beads at 25# -Fine motor task: pt using index finger to slide sequence chips to edge of table, pick up in tip pinch, then move to palm. Pt holding chips in palm and isolating index finger and thumb to slide and pick up next chip. Mod to max difficulty isolating index finger once holding chips with remaining fingers, however completing the task using a lateral pinch versus a tip pinch. Transitioned to pennies, sliding to edge of table, grasping in tip pinch, then placing in slotted container. Mod difficulty with holding the smaller pennies in a tip pinch. Completed 5 of each-chips and pennies.  -Large pegboard: pt grasping pegs with right hand, working to place into vertical large pegboard. Mod difficulty grasping and holding in lateral pinch, increased time and multiple trials to place 5 pegs   PATIENT  EDUCATION: Education details: bate fish hooks Person educated: Patient Education method: Programmer, multimedia, Demonstration, and Handouts Education comprehension: verbalized understanding and returned demonstration  HOME EXERCISE PROGRAM: Eval: yellow theraputty-grip and pinch strengthening 12/17: Wrist and digit ROM 12/27: pinch activities  1/24: Digit Isolation 1/31: Get "chip clips" and work on tip to tip pinch 2/26: Bait fish hooks   GOALS: Goals reviewed with patient? Yes  SHORT TERM GOALS: Target date: 11/27/23  Pt will be provided with and educated on HEP to improve mobility and use of RUE required for ADL completion.   Goal status: IN PROGRESS  2.  Pt will increase RUE strength to 4+/5 to improve ability to perform yardwork and fishing activities using RUE as dominant.   Goal status: MET  3.  Pt will increase right grip strength by 30+ pounds and pinch strength by 10+ pounds to improve ability to grip and utilize tools such as a toothbrush or razor with the right hand.   Goal status: IN PROGRESS  4.  Pt will improve RUE coordination required for tying shoes and operate fishing gear by completing 9 hole peg test in under 1'.   Goal status: IN PROGRESS    ASSESSMENT:  CLINICAL IMPRESSION: This session pt completed reassessment for recertification. He demonstrated good improvements with overall mobility, coordination, and strength. Overall coordination/digit isolation and manipulation are pt's biggest deficit at this time, despite good improvements. Remainder of session focusing on motor planning, in hand coordination, and continuing to maintain/improve strength. Verbal and tactile cuing provided for positioning and technique.   PERFORMANCE DEFICITS: in functional skills including ADLs, IADLs, coordination, dexterity, edema, tone, ROM, strength, and UE functional use   PLAN:  OT FREQUENCY: 2x/week  OT DURATION: 4 weeks  PLANNED INTERVENTIONS: 97168 OT Re-evaluation,  97535 self care/ADL training, 16109 therapeutic exercise, 97530 therapeutic activity, 97112 neuromuscular re-education, 97140 manual therapy, 97018 paraffin, 60454 electrical stimulation (manual), patient/family education, and DME and/or AE instructions  RECOMMENDED OTHER SERVICES: PT evaluation  CONSULTED AND AGREED WITH PLAN OF CARE: Patient  PLAN FOR NEXT SESSION: REASSESSMENT & RECERTIFICATION   Trish Mage, OTR/L  [phone] 01/14/2024, 2:47 PM

## 2024-01-15 ENCOUNTER — Encounter: Payer: Self-pay | Admitting: Family Medicine

## 2024-01-15 ENCOUNTER — Encounter (HOSPITAL_COMMUNITY): Payer: Self-pay | Admitting: Occupational Therapy

## 2024-01-15 ENCOUNTER — Ambulatory Visit (HOSPITAL_COMMUNITY): Payer: PPO | Admitting: Occupational Therapy

## 2024-01-15 ENCOUNTER — Ambulatory Visit: Payer: Medicare HMO | Admitting: Nurse Practitioner

## 2024-01-15 DIAGNOSIS — R29818 Other symptoms and signs involving the nervous system: Secondary | ICD-10-CM

## 2024-01-15 DIAGNOSIS — R278 Other lack of coordination: Secondary | ICD-10-CM

## 2024-01-15 DIAGNOSIS — R471 Dysarthria and anarthria: Secondary | ICD-10-CM | POA: Diagnosis not present

## 2024-01-15 NOTE — Therapy (Signed)
 OUTPATIENT OCCUPATIONAL THERAPY NEURO TREATMENT NOTE  Patient Name: Joshua Compton. MRN: [id number] DOB:[birth year], 83 y.o., male Today's Date: 01/15/2024    END OF SESSION:  OT End of Session - 01/15/24 1044     Visit Number 15    Number of Visits 22    Authorization Type [employer], $20 copay    Authorization Time Period no auth required    Progress Note Due on Visit 20    OT Start Time 1015    OT Stop Time 1055    OT Time Calculation (min) 40 min    Activity Tolerance Patient tolerated treatment well    Behavior During Therapy WFL for tasks assessed/performed              Past Medical History:  Diagnosis Date   Arthritis    Cancer (HCC)    prostate ca   seed implants   GERD (gastroesophageal reflux disease)    Glaucoma    Hyperlipidemia    Hypertension    dr Modesto Charon     [place]   fm   Stroke [facility]) 07/2023   Past Surgical History:  Procedure Laterality Date   BACK SURGERY     x2   COLONOSCOPY     HERNIA REPAIR  11-06-2009   left inguinal repair    NECK SURGERY     ROTATOR CUFF REPAIR Left jan 2016   TOTAL HIP ARTHROPLASTY Right 04/20/2020   Procedure: RIGHT TOTAL HIP ARTHROPLASTY ANTERIOR APPROACH;  Surgeon: Tarry Kos, MD;  Location: [facility] OR;  Service: Orthopedics;  Laterality: Right;   TOTAL HIP ARTHROPLASTY Left 01/07/2021   Procedure: LEFT TOTAL HIP ARTHROPLASTY ANTERIOR APPROACH;  Surgeon: Tarry Kos, MD;  Location: [facility] OR;  Service: Orthopedics;  Laterality: Left;   TRANSURETHRAL RESECTION OF PROSTATE     Patient Active Problem List   Diagnosis Date Noted   Centrilobular emphysema (HCC) 12/22/2023   High risk medication use 09/18/2023   Vitamin D deficiency 09/18/2023   Supplemental oxygen dependent 09/18/2023   Acute HFrEF (heart failure with reduced ejection fraction) (HCC) 09/08/2023   PAF (paroxysmal atrial fibrillation) (HCC) 09/07/2023   Acute respiratory failure with hypoxia (HCC) 09/05/2023   Acute heart failure with preserved  ejection fraction (HFpEF) (HCC) 09/05/2023   Lobar pneumonia (HCC) 09/05/2023   Cerebrovascular accident (CVA) due to embolism of left middle cerebral artery (HCC) 08/10/2023   Paresthesia of both feet 07/28/2023   Gastroesophageal reflux disease with esophagitis without hemorrhage 09/10/2021   Primary insomnia 09/10/2021   Vasculogenic erectile dysfunction 09/10/2021   Status post total replacement of left hip 01/07/2021   Primary osteoarthritis of left hip 01/06/2021   Leg cramps 12/11/2020   Thyromegaly 05/19/2017   Neoplasm of scalp 12/08/2013   Rhinitis 12/08/2013   Cough 12/08/2013   Hyperlipidemia    Cancer (HCC)    Arthritis    Severe stage glaucoma 03/26/2012   Nuclear sclerosis 03/26/2012   Primary open angle glaucoma 10/27/2011   Essential hypertension 02/04/2011   Degenerative disc disease 02/04/2011   Gastrointestinal bleed 02/04/2011   Anemia 02/04/2011   Prostate cancer (HCC) 02/04/2011   PCP: Dr. [name] REFERRING PROVIDER: Dr. [name]  ONSET DATE: 08/07/23  REFERRING DIAG: I69.998,R53.1 (ICD-10-CM) - Weakness as late effect of cerebrovascular accident (CVA)   THERAPY DIAG:  Other lack of coordination  Other symptoms and signs involving the nervous system  Rationale for Evaluation and Treatment: Rehabilitation  SUBJECTIVE:   SUBJECTIVE STATEMENT: S: "My hand just  don't wanna cooperate"  PERTINENT HISTORY: Pt is an 83 y/o male who reports he had a stroke on September 19th. From [facility]. Left sided CVA impacting the R side.   PRECAUTIONS: Fall  WEIGHT BEARING RESTRICTIONS: No  PAIN:  Are you having pain? No  FALLS: Has patient fallen in last 6 months? No  PLOF: Independent  PATIENT GOALS: To get stronger and resume independence in ADLs.   OBJECTIVE:  Note: Objective measures were completed at Evaluation unless otherwise noted.  HAND DOMINANCE: Right  ADLs: Overall ADLs: Pt reports difficulty with gripping items, pt is  unable to use a toothbrush or razor, is using LUE for ADLs. Pt is unable to tie his shoes, has not tried to hold a pencil. Using left hand to feed himself.  Pt enjoys golfing, fishing, and yardwork.    FUNCTIONAL OUTCOME MEASURES: FOTO: 78/100 01/13/24: 88.71/100  UPPER EXTREMITY ROM:      A/ROM of BUE is [facility]  UPPER EXTREMITY MMT:     MMT Right eval Right 12/07/23 Right 01/13/24  Shoulder flexion 5/5 5/5 5/5  Shoulder abduction 4/5 (same as LUE) 4+/5 5/5  Shoulder internal rotation 5/5 5/5 5/5  Shoulder external rotation 5/5 5/5 5/5  Elbow flexion 5/5 5/5 5/5  Elbow extension 5/5 5/5 5/5  Wrist flexion 4/5 5/5 5/5  Wrist extension 3+/5 4/5 4+/5  Wrist ulnar deviation 3+/5 5/5 5/5  Wrist radial deviation 3+/5 4+/5 4+/5  Wrist pronation 5/5 5/5 5/5  Wrist supination 5/5 5/5 5/5  (Blank rows = not tested)  HAND FUNCTION: Grip strength: Right: 22 lbs; Left: 110 lbs, Lateral pinch: Right: 4 lbs, Left: 26 lbs, and 3 point pinch: Right: unable lbs, Left: 17 lbs 12/07/23: Grip strength: Right: 41 lbs; Lateral pinch: Right: 8 lbs, and 3 point pinch: Right: 6 lbs 01/13/24: Grip strength: Right: 46 lbs; Lateral pinch: Right: 9 lbs, and 3 point pinch: Right: 6 lbs  COORDINATION: 9 Hole Peg test: Right: 1'48" with set-up sec; Left: 29.94 sec 12/07/23: 9 Hole Peg test: Right: 7'44" sec - on no slip mat 01/13/24: 9 Hole Peg test: Right: 4'55" sec - on no slip mat  EDEMA: mild edema in hand  TODAY'S TREATMENT:                                                                                                                              DATE:   01/15/24 -A/ROM: 1#, flexion, abduction, horizontal abduction, er/IR, x10 -UE strength: hammer curls, traditional curls x10 -Wrist strength:  4#, flexion, extension, ulnar/radial deviation, supination/pronation, x12 -Large pegboard: pt grasping pegs with right hand, working to place into vertical large pegboard. Mod difficulty grasping and holding in  lateral pinch, increased time, x16 pegs. Removes pegs independently with ease.  -Tip pinch: using BL hands to stretch rubber bands over tootsie roll container 8x, clip tree red and green clips   01/13/24 -A/ROM: 1#, flexion, abduction, horizontal abduction, er/IR, x10 -Wrist  Strengthening: 4#, flexion, extension, ulnar/radial deviation, supination/pronation, x12 -Checkers: holding 2 at a time and placing them in the connect four slots, transitioned to 1 at a time as pt's hand fatigued -9 Hole Peg Test -Measurements for reassessment  01/07/24 -Theraputty: weightshifting while flattening putty, 2 rounds, ~3 minutes each -Grip strengthening: all medium beads hand gripper horizontal at 25# -Sponges: working on isolating index finger while holding sponges in gross grasp-3, 5, 5 -Large pegboard: pt grasping pegs with right hand, working to place into vertical large pegboard. Mod difficulty grasping and holding in lateral pinch, increased time, was able to place 16 pegs today -Theraputty: flatten, using pvc pipe to cut circles into theraputty, 3 point pinch   PATIENT EDUCATION: Education details: bate fish hooks Person educated: Patient Education method: Programmer, multimedia, Demonstration, and Handouts Education comprehension: verbalized understanding and returned demonstration  HOME EXERCISE PROGRAM: Eval: yellow theraputty-grip and pinch strengthening 12/17: Wrist and digit ROM 12/27: pinch activities  1/24: Digit Isolation 1/31: Get "chip clips" and work on tip to tip pinch 2/26: Bait fish hooks   GOALS: Goals reviewed with patient? Yes  SHORT TERM GOALS: Target date: 11/27/23  Pt will be provided with and educated on HEP to improve mobility and use of RUE required for ADL completion.   Goal status: IN PROGRESS  2.  Pt will increase RUE strength to 4+/5 to improve ability to perform yardwork and fishing activities using RUE as dominant.   Goal status: MET  3.  Pt will increase right  grip strength by 30+ pounds and pinch strength by 10+ pounds to improve ability to grip and utilize tools such as a toothbrush or razor with the right hand.   Goal status: IN PROGRESS  4.  Pt will improve RUE coordination required for tying shoes and operate fishing gear by completing 9 hole peg test in under 1'.   Goal status: IN PROGRESS    ASSESSMENT:  CLINICAL IMPRESSION: Pt reports that he has been attempting to bait his hook for fishing and it has been difficult. Continued with UE strengthening this session. Targeted fine motor skills with large peg board- continued to demonstrate moderate difficulty with this task. VC for technique as needed.   PERFORMANCE DEFICITS: in functional skills including ADLs, IADLs, coordination, dexterity, edema, tone, ROM, strength, and UE functional use   PLAN:  OT FREQUENCY: 2x/week  OT DURATION: 4 weeks  PLANNED INTERVENTIONS: 97168 OT Re-evaluation, 97535 self care/ADL training, 16109 therapeutic exercise, 97530 therapeutic activity, 97112 neuromuscular re-education, 97140 manual therapy, 97018 paraffin, 60454 electrical stimulation (manual), patient/family education, and DME and/or AE instructions  RECOMMENDED OTHER SERVICES: PT evaluation  CONSULTED AND AGREED WITH PLAN OF CARE: Patient  PLAN FOR NEXT SESSION: REASSESSMENT & RECERTIFICATION   Lurena Joiner, OTR/L  [phone] 01/15/2024, 10:48 AM

## 2024-01-18 NOTE — Telephone Encounter (Signed)
 Increse the furosemide to two pills every morning

## 2024-01-19 ENCOUNTER — Ambulatory Visit (HOSPITAL_COMMUNITY): Payer: PPO | Attending: Family Medicine | Admitting: Occupational Therapy

## 2024-01-19 ENCOUNTER — Encounter (HOSPITAL_COMMUNITY): Payer: Self-pay | Admitting: Occupational Therapy

## 2024-01-19 DIAGNOSIS — R29818 Other symptoms and signs involving the nervous system: Secondary | ICD-10-CM | POA: Insufficient documentation

## 2024-01-19 DIAGNOSIS — R278 Other lack of coordination: Secondary | ICD-10-CM | POA: Insufficient documentation

## 2024-01-19 NOTE — Therapy (Signed)
 OUTPATIENT OCCUPATIONAL THERAPY NEURO TREATMENT NOTE  Patient Name: Joshua Compton. MRN: [id number] DOB:Jan 31, 1941, 83 y.o., male Today's Date: 01/19/2024    END OF SESSION:  OT End of Session - 01/19/24 1229     Visit Number 16    Number of Visits 22    Date for OT Re-Evaluation 02/19/24    Authorization Type [employer], $20 copay    Authorization Time Period no auth required    Progress Note Due on Visit 20    OT Start Time 1148    OT Stop Time 1227    OT Time Calculation (min) 39 min    Activity Tolerance Patient tolerated treatment well    Behavior During Therapy WFL for tasks assessed/performed               Past Medical History:  Diagnosis Date   Arthritis    Cancer (HCC)    prostate ca   seed implants   GERD (gastroesophageal reflux disease)    Glaucoma    Hyperlipidemia    Hypertension    dr Mod[place] Charon     [place]   fm   Stroke (HCC) 07/2023   Past Surgical History:  Procedure Laterality Date   BACK SURGERY     x2   COLONOSCOPY     HERNIA REPAIR  11-06-2009   left inguinal repair    NECK SURGERY     ROTATOR CUFF REPAIR Left jan 2016   TOTAL HIP ARTHROPLASTY Right 04/20/2020   Procedure: RIGHT TOTAL HIP ARTHROPLASTY ANTERIOR APPROACH;  Surgeon: Tarry Kos, MD;  Location: [facility] OR;  Service: Orthopedics;  Laterality: Right;   TOTAL HIP ARTHROPLASTY Left 01/07/2021   Procedure: LEFT TOTAL HIP ARTHROPLASTY ANTERIOR APPROACH;  Surgeon: Tarry Kos, MD;  Location: [facility] OR;  Service: Orthopedics;  Laterality: Left;   TRANSURETHRAL RESECTION OF PROSTATE     Patient Active Problem List   Diagnosis Date Noted   Centrilobular emphysema (HCC) 12/22/2023   High risk medication use 09/18/2023   Vitamin D deficiency 09/18/2023   Supplemental oxygen dependent 09/18/2023   Acute HFrEF (heart failure with reduced ejection fraction) (HCC) 09/08/2023   PAF (paroxysmal atrial fibrillation) (HCC) 09/07/2023   Acute respiratory failure with hypoxia (HCC)  09/05/2023   Acute heart failure with preserved ejection fraction (HFpEF) (HCC) 09/05/2023   Lobar pneumonia (HCC) 09/05/2023   Cerebrovascular accident (CVA) due to embolism of left middle cerebral artery (HCC) 08/10/2023   Paresthesia of both feet 07/28/2023   Gastroesophageal reflux disease with esophagitis without hemorrhage 09/10/2021   Primary insomnia 09/10/2021   Vasculogenic erectile dysfunction 09/10/2021   Status post total replacement of left hip 01/07/2021   Primary osteoarthritis of left hip 01/06/2021   Leg cramps 12/11/2020   Thyromegaly 05/19/2017   Neoplasm of scalp 12/08/2013   Rhinitis 12/08/2013   Cough 12/08/2013   Hyperlipidemia    Cancer (HCC)    Arthritis    Severe stage glaucoma 03/26/2012   Nuclear sclerosis 03/26/2012   Primary open angle glaucoma 10/27/2011   Essential hypertension 02/04/2011   Degenerative disc disease 02/04/2011   Gastrointestinal bleed 02/04/2011   Anemia 02/04/2011   Prostate cancer (HCC) 02/04/2011   PCP: Dr. [name] REFERRING PROVIDER: Dr. [name]  ONSET DATE: 08/07/23  REFERRING DIAG: I69.998,R53.1 (ICD-10-CM) - Weakness as late effect of cerebrovascular accident (CVA)   THERAPY DIAG:  Other lack of coordination  Other symptoms and signs involving the nervous system  Rationale for Evaluation and Treatment: Rehabilitation  SUBJECTIVE:   SUBJECTIVE STATEMENT: S: "My hand and fingers need to be better."  PERTINENT HISTORY: Pt is an 83 y/o male who reports he had a stroke on September 19th. From [facility]. Left sided CVA impacting the R side.   PRECAUTIONS: Fall  WEIGHT BEARING RESTRICTIONS: No  PAIN:  Are you having pain? No  FALLS: Has patient fallen in last 6 months? No  PLOF: Independent  PATIENT GOALS: To get stronger and resume independence in ADLs.   OBJECTIVE:  Note: Objective measures were completed at Evaluation unless otherwise noted.  HAND DOMINANCE: Right  ADLs: Overall  ADLs: Pt reports difficulty with gripping items, pt is unable to use a toothbrush or razor, is using LUE for ADLs. Pt is unable to tie his shoes, has not tried to hold a pencil. Using left hand to feed himself.  Pt enjoys golfing, fishing, and yardwork.    FUNCTIONAL OUTCOME MEASURES: FOTO: 78/100 01/13/24: 88.71/100  UPPER EXTREMITY ROM:      A/ROM of BUE is [facility]  UPPER EXTREMITY MMT:     MMT Right eval Right 12/07/23 Right 01/13/24  Shoulder flexion 5/5 5/5 5/5  Shoulder abduction 4/5 (same as LUE) 4+/5 5/5  Shoulder internal rotation 5/5 5/5 5/5  Shoulder external rotation 5/5 5/5 5/5  Elbow flexion 5/5 5/5 5/5  Elbow extension 5/5 5/5 5/5  Wrist flexion 4/5 5/5 5/5  Wrist extension 3+/5 4/5 4+/5  Wrist ulnar deviation 3+/5 5/5 5/5  Wrist radial deviation 3+/5 4+/5 4+/5  Wrist pronation 5/5 5/5 5/5  Wrist supination 5/5 5/5 5/5  (Blank rows = not tested)  HAND FUNCTION: Grip strength: Right: 22 lbs; Left: 110 lbs, Lateral pinch: Right: 4 lbs, Left: 26 lbs, and 3 point pinch: Right: unable lbs, Left: 17 lbs 12/07/23: Grip strength: Right: 41 lbs; Lateral pinch: Right: 8 lbs, and 3 point pinch: Right: 6 lbs 01/13/24: Grip strength: Right: 46 lbs; Lateral pinch: Right: 9 lbs, and 3 point pinch: Right: 6 lbs  COORDINATION: 9 Hole Peg test: Right: 1'48" with set-up sec; Left: 29.94 sec 12/07/23: 9 Hole Peg test: Right: 7'44" sec - on no slip mat 01/13/24: 9 Hole Peg test: Right: 4'55" sec - on no slip mat  EDEMA: mild edema in hand  TODAY'S TREATMENT:                                                                                                                              DATE:  01/19/24 -Theraputty: weightshifting while flattening putty, 2 rounds, ~3 minutes each -Sponges: working on isolating index finger while holding sponges in gross grasp, 4, 5, 6 -Grip strengthening: all large beads with gripper horizontal at 29#, all medium beads hand gripper horizontal at 25# -A/ROM:  finger taps, tendon glides, 10 reps -Pinch strengthening: pt using lateral pinch and red clothespin to grasp sponges and stack into towers of 4. Unable to stack, downgraded activity to grasping and replacing into bucket. Pt  initially able to open clothespin fully, decreased with fatigue.  -Large pegboard: pt reaching out or overhead to grasp pegs from OT, working to place into vertical large pegboard. Min difficulty grasping and holding in lateral pinch today, slightly increased time for placing 9 pegs. Removing pegs 2 at a time, min difficulty.  -Overhead lacing: seated, lacing from top down approximately 10 links with increased time  01/15/24 -A/ROM: 1#, flexion, abduction, horizontal abduction, er/IR, x10 -UE strength: hammer curls, traditional curls x10 -Wrist strength:  4#, flexion, extension, ulnar/radial deviation, supination/pronation, x12 -Large pegboard: pt grasping pegs with right hand, working to place into vertical large pegboard. Mod difficulty grasping and holding in lateral pinch, increased time, x16 pegs. Removes pegs independently with ease.  -Tip pinch: using BL hands to stretch rubber bands over tootsie roll container 8x, clip tree red and green clips   01/13/24 -A/ROM: 1#, flexion, abduction, horizontal abduction, er/IR, x10 -Wrist Strengthening: 4#, flexion, extension, ulnar/radial deviation, supination/pronation, x12 -Checkers: holding 2 at a time and placing them in the connect four slots, transitioned to 1 at a time as pt's hand fatigued -9 Hole Peg Test -Measurements for reassessment   PATIENT EDUCATION: Education details: reviewed HEP Person educated: Patient Education method: Programmer, multimedia, Demonstration, and Handouts Education comprehension: verbalized understanding and returned demonstration  HOME EXERCISE PROGRAM: Eval: yellow theraputty-grip and pinch strengthening 12/17: Wrist and digit ROM 12/27: pinch activities  1/24: Digit Isolation 1/31: Get "chip  clips" and work on tip to tip pinch 2/26: Bait fish hooks   GOALS: Goals reviewed with patient? Yes  SHORT TERM GOALS: Target date: 11/27/23  Pt will be provided with and educated on HEP to improve mobility and use of RUE required for ADL completion.   Goal status: IN PROGRESS  2.  Pt will increase RUE strength to 4+/5 to improve ability to perform yardwork and fishing activities using RUE as dominant.   Goal status: MET  3.  Pt will increase right grip strength by 30+ pounds and pinch strength by 10+ pounds to improve ability to grip and utilize tools such as a toothbrush or razor with the right hand.   Goal status: IN PROGRESS  4.  Pt will improve RUE coordination required for tying shoes and operate fishing gear by completing 9 hole peg test in under 1'.   Goal status: IN PROGRESS    ASSESSMENT:  CLINICAL IMPRESSION: Session focusing on grip strengthening and fine motor work today. Pt able to increase gripper resistance for large beads, reduced back to 25# for medium beads. Continued with fine motor task of placing large pegs, improvement in ease of grasping and placing today. Added overhead lacing for fine motor challenge, increased time required for task. Also added tendon glides for isolating digits. Verbal and visual cuing for novel tasks, form and technique. Attempted pinch task, reducing for just right challenge.   PERFORMANCE DEFICITS: in functional skills including ADLs, IADLs, coordination, dexterity, edema, tone, ROM, strength, and UE functional use   PLAN:  OT FREQUENCY: 2x/week  OT DURATION: 4 weeks  PLANNED INTERVENTIONS: 97168 OT Re-evaluation, 97535 self care/ADL training, 28413 therapeutic exercise, 97530 therapeutic activity, 97112 neuromuscular re-education, 97140 manual therapy, 97018 paraffin, 24401 electrical stimulation (manual), patient/family education, and DME and/or AE instructions  RECOMMENDED OTHER SERVICES: PT evaluation  CONSULTED AND  AGREED WITH PLAN OF CARE: Patient  PLAN FOR NEXT SESSION: tendon gliding, grip and pinch strengthening, fine motor work   [employer], OTR/L  [phone]  01/19/2024, 12:30 PM

## 2024-01-21 ENCOUNTER — Ambulatory Visit (HOSPITAL_BASED_OUTPATIENT_CLINIC_OR_DEPARTMENT_OTHER): Payer: PPO | Admitting: Pulmonary Disease

## 2024-01-21 DIAGNOSIS — J432 Centrilobular emphysema: Secondary | ICD-10-CM

## 2024-01-21 LAB — PULMONARY FUNCTION TEST
DL/VA % pred: 57 %
DL/VA: 2.21 ml/min/mmHg/L
DLCO cor % pred: 53 %
DLCO cor: 13.39 ml/min/mmHg
DLCO unc % pred: 52 %
DLCO unc: 12.96 ml/min/mmHg
FEF 25-75 Post: 0.86 L/s
FEF 25-75 Pre: 0.56 L/s
FEF2575-%Change-Post: 53 %
FEF2575-%Pred-Post: 43 %
FEF2575-%Pred-Pre: 28 %
FEV1-%Change-Post: 16 %
FEV1-%Pred-Post: 58 %
FEV1-%Pred-Pre: 49 %
FEV1-Post: 1.7 L
FEV1-Pre: 1.46 L
FEV1FVC-%Change-Post: 1 %
FEV1FVC-%Pred-Pre: 68 %
FEV6-%Change-Post: 15 %
FEV6-%Pred-Post: 85 %
FEV6-%Pred-Pre: 73 %
FEV6-Post: 3.28 L
FEV6-Pre: 2.83 L
FEV6FVC-%Change-Post: 1 %
FEV6FVC-%Pred-Post: 103 %
FEV6FVC-%Pred-Pre: 102 %
FVC-%Change-Post: 14 %
FVC-%Pred-Post: 82 %
FVC-%Pred-Pre: 72 %
FVC-Post: 3.41 L
FVC-Pre: 2.97 L
Post FEV1/FVC ratio: 50 %
Post FEV6/FVC ratio: 96 %
Pre FEV1/FVC ratio: 49 %
Pre FEV6/FVC Ratio: 95 %
RV % pred: 141 %
RV: 3.9 L
TLC % pred: 109 %
TLC: 7.99 L

## 2024-01-21 NOTE — Patient Instructions (Signed)
 Full PFT Performed Today

## 2024-01-21 NOTE — Progress Notes (Signed)
 Full PFT Performed Today

## 2024-01-22 ENCOUNTER — Ambulatory Visit (HOSPITAL_COMMUNITY): Payer: PPO | Admitting: Occupational Therapy

## 2024-01-22 MED ORDER — TRELEGY ELLIPTA 100-62.5-25 MCG/ACT IN AEPB: 1.0000 | INHALATION_SPRAY | Freq: Every day | RESPIRATORY_TRACT | 5 refills | Status: DC

## 2024-01-22 NOTE — Addendum Note (Signed)
 Addended by: Jama Flavors on: 01/22/2024 11:45 AM   Modules accepted: Orders

## 2024-01-25 ENCOUNTER — Other Ambulatory Visit: Payer: Self-pay | Admitting: Family Medicine

## 2024-01-25 ENCOUNTER — Ambulatory Visit (INDEPENDENT_AMBULATORY_CARE_PROVIDER_SITE_OTHER): Admitting: Family Medicine

## 2024-01-25 ENCOUNTER — Ambulatory Visit: Payer: Self-pay | Admitting: Family Medicine

## 2024-01-25 ENCOUNTER — Ambulatory Visit (HOSPITAL_COMMUNITY): Payer: PPO | Admitting: Occupational Therapy

## 2024-01-25 ENCOUNTER — Encounter (HOSPITAL_COMMUNITY): Payer: Self-pay | Admitting: Occupational Therapy

## 2024-01-25 ENCOUNTER — Encounter: Payer: Self-pay | Admitting: Family Medicine

## 2024-01-25 VITALS — BP 158/57 | HR 60 | Temp 98.3°F | Ht 71.0 in | Wt 203.0 lb

## 2024-01-25 DIAGNOSIS — J432 Centrilobular emphysema: Secondary | ICD-10-CM | POA: Diagnosis not present

## 2024-01-25 DIAGNOSIS — I83029 Varicose veins of left lower extremity with ulcer of unspecified site: Secondary | ICD-10-CM

## 2024-01-25 DIAGNOSIS — I5022 Chronic systolic (congestive) heart failure: Secondary | ICD-10-CM | POA: Diagnosis not present

## 2024-01-25 DIAGNOSIS — I10 Essential (primary) hypertension: Secondary | ICD-10-CM

## 2024-01-25 DIAGNOSIS — L97929 Non-pressure chronic ulcer of unspecified part of left lower leg with unspecified severity: Secondary | ICD-10-CM

## 2024-01-25 DIAGNOSIS — R278 Other lack of coordination: Secondary | ICD-10-CM | POA: Diagnosis not present

## 2024-01-25 DIAGNOSIS — I872 Venous insufficiency (chronic) (peripheral): Secondary | ICD-10-CM

## 2024-01-25 DIAGNOSIS — R29818 Other symptoms and signs involving the nervous system: Secondary | ICD-10-CM

## 2024-01-25 MED ORDER — TORSEMIDE 20 MG PO TABS: 40.0000 mg | ORAL_TABLET | Freq: Every day | ORAL | 1 refills | Status: DC

## 2024-01-25 MED ORDER — TORSEMIDE 40 MG PO TABS: 40.0000 mg | ORAL_TABLET | ORAL | 1 refills | Status: DC

## 2024-01-25 NOTE — Telephone Encounter (Signed)
   Chief Complaint: bilateral leg swelling Symptoms: leg swelling from knees to ankles bilaterally, tingling and burning in legs, unhealed cut on leg,  Frequency: exacerbation over the last week Pertinent Negatives: Patient denies fever, sob, chest pain Disposition: [] ED /[] Urgent Care (no appt availability in office) / [x] Appointment(In office/virtual)/ []  [facility]/ [] Home Care/ [] Refused Recommended Disposition /[] [facility]/ []  Follow-up with PCP Additional Notes: Patients daughter called in reporting that patient is currently having an exacerbation of his leg swelling bilaterally. Swelling is from knee down to ankle on both sides. Patient is reporting feeling burning and pain bilaterally. Daughter also mentions patient has a cut on his one leg where it is swollen and it does not appear to be healing. Per protocol, appt scheduled today 3/10. Advised to call back with worsening symptoms. Daughter verbalized understanding.    Copied from CRM [id number]. Topic: Clinical - Red Word Triage >> Jan 25, 2024  8:52 AM Turkey B wrote: [facility] that prompted transfer to Nurse Triage: pt's daughter called in, pt has pain and swelling and tingling I legs Reason for Disposition  [1] MODERATE leg swelling (e.g., swelling extends up to knees) AND [2] new-onset or worsening  Answer Assessment - Initial Assessment Questions 1. ONSET: "When did the swelling start?" (e.g., minutes, hours, days)     Off and on since october 2. LOCATION: "What part of the leg is swollen?"  "Are both legs swollen or just one leg?"     Both legs, knee down to ankle 3. SEVERITY: "How bad is the swelling?" (e.g., localized; mild, moderate, severe)   - Localized: Small area of swelling localized to one leg.   - MILD pedal edema: Swelling limited to foot and ankle, pitting edema < 1/4 inch (6 mm) deep, rest and elevation eliminate most or all swelling.   - MODERATE edema: Swelling of lower leg to knee,  pitting edema > 1/4 inch (6 mm) deep, rest and elevation only partially reduce swelling.   - SEVERE edema: Swelling extends above knee, facial or hand swelling present.      moderate 4. REDNESS: "Does the swelling look red or infected?"     yes 5. PAIN: "Is the swelling painful to touch?" If Yes, ask: "How painful is it?"   (Scale 1-10; mild, moderate or severe)     Burning/tingling feeling, moderate 6. FEVER: "Do you have a fever?" If Yes, ask: "What is it, how was it measured, and when did it start?"      no 7. CAUSE: "What do you think is causing the leg swelling?"     He was hospitalized for heart failure in October and it comes and goes now 8. MEDICAL HISTORY: "Do you have a history of blood clots (e.g., DVT), cancer, heart failure, kidney disease, or liver failure?"     Heart failure, kidney problems 9. RECURRENT SYMPTOM: "Have you had leg swelling before?" If Yes, ask: "When was the last time?" "What happened that time?"     yes 10. OTHER SYMPTOMS: "Do you have any other symptoms?" (e.g., chest pain, difficulty breathing)       Pain, tingling in legs  Protocols used: Leg Swelling and Edema-A-AH

## 2024-01-25 NOTE — Telephone Encounter (Signed)
  SOAANZ 40 MG TABS   Pharmacy comment: Alternative Requested:NOT COVERED - CAN YOU CHANGE TO TORSEMIDE 20MG  2 DAILY?

## 2024-01-25 NOTE — Progress Notes (Signed)
 Subjective:  Patient ID: Joshua Compton., male    DOB: 1941-08-15  Age: 83 y.o. MRN: [id number]  CC: Edema (Bilateral edema in legs. Sores and burning on shins for about a week.  Applying neosporin and propping legs up. Started afetr stroke and medication changes. Doesn't seem to effect feet. Sharp pains in legs on occasion. )   HPI [name]. presents for swelling in ankles. Not dspneic.      10/19/2023    1:08 PM 07/28/2023    8:57 AM 04/30/2023    2:17 PM  Depression screen PHQ 2/9  Decreased Interest 0 0 0  Down, Depressed, Hopeless 0 0 0  PHQ - 2 Score 0 0 0    History Taggert has a past medical history of Arthritis, Cancer (HCC), GERD (gastroesophageal reflux disease), Glaucoma, Hyperlipidemia, Hypertension, and Stroke (HCC) (07/2023).   He has a past surgical history that includes Back surgery; Neck surgery; Transurethral resection of prostate; Hernia repair (11-06-2009); Rotator cuff repair (Left, jan 2016); Colonoscopy; Total hip arthroplasty (Right, 04/20/2020); and Total hip arthroplasty (Left, 01/07/2021).   His family history includes Cancer in his brother, brother, and mother; Heart disease in his father.He reports that he quit smoking about 18 years ago. His smoking use included cigarettes. He started smoking about 48 years ago. He has a 15 pack-year smoking history. He has never used smokeless tobacco. He reports that he does not drink alcohol and does not use drugs.    ROS Review of Systems  Constitutional:  Negative for fever.  Respiratory:  Negative for shortness of breath.   Cardiovascular:  Negative for chest pain.  Musculoskeletal:  Negative for arthralgias.  Skin:  Negative for rash.    Objective:  BP (!) 158/57   Pulse 60   Temp 98.3 F (36.8 C)   Ht 5\' 11"  (1.803 m)   Wt 203 lb (92.1 kg)   SpO2 95%   BMI 28.31 kg/m   BP Readings from Last 3 Encounters:  01/25/24 (!) 158/57  01/07/24 (!) 141/50  12/24/23 (!) 174/68    Wt  Readings from Last 3 Encounters:  01/25/24 203 lb (92.1 kg)  01/21/24 204 lb 9.6 oz (92.8 kg)  01/07/24 206 lb 3.2 oz (93.5 kg)     Physical Exam Constitutional:      General: He is not in acute distress.    Appearance: He is well-developed.  HENT:     Head: Normocephalic and atraumatic.     Right Ear: External ear normal.     Left Ear: External ear normal.     Nose: Nose normal.  Eyes:     Conjunctiva/sclera: Conjunctivae normal.     Pupils: Pupils are equal, round, and reactive to light.  Cardiovascular:     Rate and Rhythm: Normal rate and regular rhythm.     Heart sounds: Normal heart sounds. No murmur heard. Pulmonary:     Effort: Pulmonary effort is normal. No respiratory distress.     Breath sounds: Normal breath sounds. No wheezing or rales.  Abdominal:     Palpations: Abdomen is soft.     Tenderness: There is no abdominal tenderness.  Musculoskeletal:        General: Swelling present. Normal range of motion.     Cervical back: Normal range of motion and neck supple.     Right lower leg: Edema present.     Left lower leg: Edema present.  Skin:    General: Skin is warm and dry.  Findings: Lesion (grade 1 ulcers at left anterior leg with 2-3+ edema to mid leg bilaterally) present.  Neurological:     Mental Status: He is alert and oriented to person, place, and time.     Deep Tendon Reflexes: Reflexes are normal and symmetric.  Psychiatric:        Behavior: Behavior normal.        Thought Content: Thought content normal.        Judgment: Judgment normal.       Assessment & Plan:   Riel was seen today for edema.  Diagnoses and all orders for this visit:  Centrilobular emphysema (HCC)  Essential hypertension  Chronic systolic congestive heart failure (HCC) -     Brain natriuretic peptide -     BMP8+EGFR  Stasis dermatitis of both legs  Stasis ulcer of lower extremity, left (HCC)  Other orders -     Discontinue: Torsemide 40 MG TABS; Take  40 mg by mouth every morning. For swelling -     torsemide (DEMADEX) 20 MG tablet; Take 2 tablets (40 mg total) by mouth daily.       I have discontinued [name]'s furosemide, methocarbamol, traMADol, benzonatate, and Torsemide. I am also having him start on torsemide. Additionally, I am having him maintain his dorzolamide-timolol, pantoprazole, amiodarone, fluticasone, cholecalciferol, atorvastatin, apixaban, Entresto, dapagliflozin propanediol, rOPINIRole, acetaminophen, metoprolol succinate, and Trelegy Ellipta.  Allergies as of 01/25/2024       Reactions   Iodinated Contrast Media Anaphylaxis   Tongue swelled up when he had MRI in [facility] Intubated for airway protection after oral and throat swelling   Multihance [gadobenate] Swelling   Tongue swelled up when he had MRI in [facility]   Pravastatin Other (See Comments)   Muscle aches   Pravastatin Sodium    Muscle aches        Medication List        Accurate as of January 25, 2024  4:49 PM. If you have any questions, ask your nurse or doctor.          STOP taking these medications    benzonatate 200 MG capsule Commonly known as: TESSALON Stopped by: Nanda Bittick   furosemide 20 MG tablet Commonly known as: LASIX Stopped by: Romello Hoehn   methocarbamol 500 MG tablet Commonly known as: ROBAXIN Stopped by: Demone Lyles   traMADol 50 MG tablet Commonly known as: Insurance underwriter by: Cinthia Rodden       TAKE these medications    acetaminophen 500 MG tablet Commonly known as: TYLENOL Take 1,000 mg by mouth every 6 (six) hours as needed for mild pain (pain score 1-3).   amiodarone 200 MG tablet Commonly known as: PACERONE Take 1 tablet (200 mg total) by mouth 2 (two) times daily. X 20 days, then once daily starting 09/28/23   apixaban 5 MG Tabs tablet Commonly known as: Eliquis Take 1 tablet (5 mg total) by mouth 2 (two) times daily.   atorvastatin 40 MG tablet Commonly known as:  LIPITOR TAKE 1 TABLET BY MOUTH EVERYDAY AT BEDTIME   cholecalciferol 25 MCG (1000 UNIT) tablet Commonly known as: VITAMIN D3 TAKE 1 TABLET BY MOUTH EVERY DAY   dapagliflozin propanediol 10 MG Tabs tablet Commonly known as: Farxiga Take 1 tablet (10 mg total) by mouth daily.   dorzolamide-timolol 2-0.5 % ophthalmic solution Commonly known as: COSOPT Place 1 drop into both eyes daily.   Entresto 24-26 MG Generic drug: sacubitril-valsartan Take 1 tablet by mouth 2 (  two) times daily.   fluticasone 50 MCG/ACT nasal spray Commonly known as: FLONASE SPRAY 2 SPRAYS INTO EACH NOSTRIL EVERY DAY What changed: See the new instructions.   metoprolol succinate 25 MG 24 hr tablet Commonly known as: TOPROL-XL TAKE 0.5 TABLETS (12.5 MG TOTAL) BY MOUTH DAILY. FOR BLOOD PRESSURE CONTROL   pantoprazole 40 MG tablet Commonly known as: PROTONIX Take 1 tablet (40 mg total) by mouth daily. For heartburn   rOPINIRole 1 MG tablet Commonly known as: REQUIP Take 1 tablet (1 mg total) by mouth 3 (three) times daily. Plus an extra tablet at bedtime to total four a day   torsemide 20 MG tablet Commonly known as: DEMADEX Take 2 tablets (40 mg total) by mouth daily. Started by: Broadus John Jes Costales   Trelegy Ellipta 100-62.5-25 MCG/ACT Aepb Generic drug: Fluticasone-Umeclidin-Vilant Inhale 1 puff into the lungs daily.       Henriette Combs applied  Follow-up: Return in about 7 years (around 01/25/2031).  [name], M.D.

## 2024-01-25 NOTE — Therapy (Signed)
 OUTPATIENT OCCUPATIONAL THERAPY NEURO TREATMENT NOTE  Patient Name: Joshua Compton. MRN: [id number] DOB:July 08, 1941, 83 y.o., male Today's Date: 01/25/2024    END OF SESSION:  OT End of Session - 01/25/24 1148     Visit Number 17    Number of Visits 22    Date for OT Re-Evaluation 02/19/24    Authorization Type [employer], $20 copay    Authorization Time Period no auth required    Progress Note Due on Visit 20    OT Start Time 1104    OT Stop Time 1145    OT Time Calculation (min) 41 min    Activity Tolerance Patient tolerated treatment well    Behavior During Therapy WFL for tasks assessed/performed             Past Medical History:  Diagnosis Date   Arthritis    Cancer (HCC)    prostate ca   seed implants   GERD (gastroesophageal reflux disease)    Glaucoma    Hyperlipidemia    Hypertension    dr Modesto Charon     [facility]   fm   Stroke (HCC) 07/2023   Past Surgical History:  Procedure Laterality Date   BACK SURGERY     x2   COLONOSCOPY     HERNIA REPAIR  11-06-2009   left inguinal repair    NECK SURGERY     ROTATOR CUFF REPAIR Left jan 2016   TOTAL HIP ARTHROPLASTY Right 04/20/2020   Procedure: RIGHT TOTAL HIP ARTHROPLASTY ANTERIOR APPROACH;  Surgeon: Tarry Kos, MD;  Location: [facility] OR;  Service: Orthopedics;  Laterality: Right;   TOTAL HIP ARTHROPLASTY Left 01/07/2021   Procedure: LEFT TOTAL HIP ARTHROPLASTY ANTERIOR APPROACH;  Surgeon: Tarry Kos, MD;  Location: [facility] OR;  Service: Orthopedics;  Laterality: Left;   TRANSURETHRAL RESECTION OF PROSTATE     Patient Active Problem List   Diagnosis Date Noted   Centrilobular emphysema (HCC) 12/22/2023   High risk medication use 09/18/2023   Vitamin D deficiency 09/18/2023   Supplemental oxygen dependent 09/18/2023   Acute HFrEF (heart failure with reduced ejection fraction) (HCC) 09/08/2023   PAF (paroxysmal atrial fibrillation) (HCC) 09/07/2023   Acute respiratory failure with hypoxia (HCC) 09/05/2023    Acute heart failure with preserved ejection fraction (HFpEF) (HCC) 09/05/2023   Lobar pneumonia (HCC) 09/05/2023   Cerebrovascular accident (CVA) due to embolism of left middle cerebral artery (HCC) 08/10/2023   Paresthesia of both feet 07/28/2023   Gastroesophageal reflux disease with esophagitis without hemorrhage 09/10/2021   Primary insomnia 09/10/2021   Vasculogenic erectile dysfunction 09/10/2021   Status post total replacement of left hip 01/07/2021   Primary osteoarthritis of left hip 01/06/2021   Leg cramps 12/11/2020   Thyromegaly 05/19/2017   Neoplasm of scalp 12/08/2013   Rhinitis 12/08/2013   Cough 12/08/2013   Hyperlipidemia    Cancer (HCC)    Arthritis    Severe stage glaucoma 03/26/2012   Nuclear sclerosis 03/26/2012   Primary open angle glaucoma 10/27/2011   Essential hypertension 02/04/2011   Degenerative disc disease 02/04/2011   Gastrointestinal bleed 02/04/2011   Anemia 02/04/2011   Prostate cancer (HCC) 02/04/2011   PCP: Dr. [name] REFERRING PROVIDER: Dr. [name]  ONSET DATE: 08/07/23  REFERRING DIAG: I69.998,R53.1 (ICD-10-CM) - Weakness as late effect of cerebrovascular accident (CVA)   THERAPY DIAG:  Other symptoms and signs involving the nervous system  Other lack of coordination  Rationale for Evaluation and Treatment: Rehabilitation  SUBJECTIVE:  SUBJECTIVE STATEMENT: S: "I'm working more on my exercises at home."  PERTINENT HISTORY: Pt is an 83 y/o male who reports he had a stroke on September 19th. From [facility]. Left sided CVA impacting the R side.   PRECAUTIONS: Fall  WEIGHT BEARING RESTRICTIONS: No  PAIN:  Are you having pain? No  FALLS: Has patient fallen in last 6 months? No  PLOF: Independent  PATIENT GOALS: To get stronger and resume independence in ADLs.   OBJECTIVE:  Note: Objective measures were completed at Evaluation unless otherwise noted.  HAND DOMINANCE: Right  ADLs: Overall ADLs: Pt  reports difficulty with gripping items, pt is unable to use a toothbrush or razor, is using LUE for ADLs. Pt is unable to tie his shoes, has not tried to hold a pencil. Using left hand to feed himself.  Pt enjoys golfing, fishing, and yardwork.    FUNCTIONAL OUTCOME MEASURES: FOTO: 78/100 01/13/24: 88.71/100  UPPER EXTREMITY ROM:      A/ROM of BUE is [facility]  UPPER EXTREMITY MMT:     MMT Right eval Right 12/07/23 Right 01/13/24  Shoulder flexion 5/5 5/5 5/5  Shoulder abduction 4/5 (same as LUE) 4+/5 5/5  Shoulder internal rotation 5/5 5/5 5/5  Shoulder external rotation 5/5 5/5 5/5  Elbow flexion 5/5 5/5 5/5  Elbow extension 5/5 5/5 5/5  Wrist flexion 4/5 5/5 5/5  Wrist extension 3+/5 4/5 4+/5  Wrist ulnar deviation 3+/5 5/5 5/5  Wrist radial deviation 3+/5 4+/5 4+/5  Wrist pronation 5/5 5/5 5/5  Wrist supination 5/5 5/5 5/5  (Blank rows = not tested)  HAND FUNCTION: Grip strength: Right: 22 lbs; Left: 110 lbs, Lateral pinch: Right: 4 lbs, Left: 26 lbs, and 3 point pinch: Right: unable lbs, Left: 17 lbs 12/07/23: Grip strength: Right: 41 lbs; Lateral pinch: Right: 8 lbs, and 3 point pinch: Right: 6 lbs 01/13/24: Grip strength: Right: 46 lbs; Lateral pinch: Right: 9 lbs, and 3 point pinch: Right: 6 lbs  COORDINATION: 9 Hole Peg test: Right: 1'48" with set-up sec; Left: 29.94 sec 12/07/23: 9 Hole Peg test: Right: 7'44" sec - on no slip mat 01/13/24: 9 Hole Peg test: Right: 4'55" sec - on no slip mat  EDEMA: mild edema in hand  TODAY'S TREATMENT:                                                                                                                              DATE:   01/25/24 -Theraputty: red putty, roll into a ball, flatten into a pancake with weight shifting through arm, PVC pipe using wrist flexion/extension to cut circles, roll into log, tripod pinch x10 (mod difficulty), lateral pinch x10, roll into a ball, squeeze x10 -Pinching: stacking 6 cubes with tip to tip pinch  (mod difficulty), using red resistance clip (max difficulty), switched to yellow resistance clip (mod difficulty), to stack 4 -Coins: flipping coins from tails to heads, picking up 1 coin at a time  and placing in tootsie roll bank slot -Digit ROM: finger taps, opposition, x10 each digit , min to mod assist -Gripper: 25# vertically picking up 8 medium beads, 29# vertically picking up 6 medium beads  01/19/24 -Theraputty: weightshifting while flattening putty, 2 rounds, ~3 minutes each -Sponges: working on isolating index finger while holding sponges in gross grasp, 4, 5, 6 -Grip strengthening: all large beads with gripper horizontal at 29#, all medium beads hand gripper horizontal at 25# -A/ROM: finger taps, tendon glides, 10 reps -Pinch strengthening: pt using lateral pinch and red clothespin to grasp sponges and stack into towers of 4. Unable to stack, downgraded activity to grasping and replacing into bucket. Pt initially able to open clothespin fully, decreased with fatigue.  -Large pegboard: pt reaching out or overhead to grasp pegs from OT, working to place into vertical large pegboard. Min difficulty grasping and holding in lateral pinch today, slightly increased time for placing 9 pegs. Removing pegs 2 at a time, min difficulty.  -Overhead lacing: seated, lacing from top down approximately 10 links with increased time  01/15/24 -A/ROM: 1#, flexion, abduction, horizontal abduction, er/IR, x10 -UE strength: hammer curls, traditional curls x10 -Wrist strength:  4#, flexion, extension, ulnar/radial deviation, supination/pronation, x12 -Large pegboard: pt grasping pegs with right hand, working to place into vertical large pegboard. Mod difficulty grasping and holding in lateral pinch, increased time, x16 pegs. Removes pegs independently with ease.  -Tip pinch: using BL hands to stretch rubber bands over tootsie roll container 8x, clip tree red and green clips    PATIENT EDUCATION: Education  details: Restart finger taps and opposition Person educated: Patient Education method: Programmer, multimedia, Demonstration, and Handouts Education comprehension: verbalized understanding and returned demonstration  HOME EXERCISE PROGRAM: Eval: yellow theraputty-grip and pinch strengthening 12/17: Wrist and digit ROM 12/27: pinch activities  1/24: Digit Isolation 1/31: Get "chip clips" and work on tip to tip pinch 2/26: Bait fish hooks 3/10: restart finger taps and opposition   GOALS: Goals reviewed with patient? Yes  SHORT TERM GOALS: Target date: 11/27/23  Pt will be provided with and educated on HEP to improve mobility and use of RUE required for ADL completion.   Goal status: IN PROGRESS  2.  Pt will increase RUE strength to 4+/5 to improve ability to perform yardwork and fishing activities using RUE as dominant.   Goal status: MET  3.  Pt will increase right grip strength by 30+ pounds and pinch strength by 10+ pounds to improve ability to grip and utilize tools such as a toothbrush or razor with the right hand.   Goal status: IN PROGRESS  4.  Pt will improve RUE coordination required for tying shoes and operate fishing gear by completing 9 hole peg test in under 1'.   Goal status: IN PROGRESS    ASSESSMENT:  CLINICAL IMPRESSION: This session, OT was able to upgrade several activities with patient. He was able to transition from using checkers to picking up pennies 1 at a time and placing them in a piggy bank slot with mod difficulty at first transitioning to min difficulty. He continues to have difficulty with digit isolation, however with increased time and min assist he is able to achieve each motion requested by OT. Up to mod assist was provided for positioning throughout session, as well as verbal and visual cuing for technique.   PERFORMANCE DEFICITS: in functional skills including ADLs, IADLs, coordination, dexterity, edema, tone, ROM, strength, and UE functional  use   PLAN:  OT  FREQUENCY: 2x/week  OT DURATION: 4 weeks  PLANNED INTERVENTIONS: 97168 OT Re-evaluation, 97535 self care/ADL training, 78295 therapeutic exercise, 97530 therapeutic activity, 97112 neuromuscular re-education, 97140 manual therapy, 97018 paraffin, 62130 electrical stimulation (manual), patient/family education, and DME and/or AE instructions  RECOMMENDED OTHER SERVICES: PT evaluation  CONSULTED AND AGREED WITH PLAN OF CARE: Patient  PLAN FOR NEXT SESSION: tendon gliding, grip and pinch strengthening, fine motor work   [employer], OTR/L [phone]  01/25/2024, 11:49 AM

## 2024-01-25 NOTE — Telephone Encounter (Signed)
 APPT MADE

## 2024-01-26 LAB — BMP8+EGFR
BUN/Creatinine Ratio: 10 (ref 10–24)
BUN: 17 mg/dL (ref 8–27)
CO2: 29 mmol/L (ref 20–29)
Calcium: 9.6 mg/dL (ref 8.6–10.2)
Chloride: 102 mmol/L (ref 96–106)
Creatinine, Ser: 1.62 mg/dL — ABNORMAL HIGH (ref 0.76–1.27)
Glucose: 90 mg/dL (ref 70–99)
Potassium: 3.7 mmol/L (ref 3.5–5.2)
Sodium: 145 mmol/L — ABNORMAL HIGH (ref 134–144)
eGFR: 42 mL/min/{1.73_m2} — ABNORMAL LOW (ref >59)

## 2024-01-26 LAB — BRAIN NATRIURETIC PEPTIDE: BNP: 131.3 pg/mL — ABNORMAL HIGH (ref 0.0–100.0)

## 2024-01-28 ENCOUNTER — Encounter (HOSPITAL_COMMUNITY): Payer: Self-pay | Admitting: Occupational Therapy

## 2024-01-28 ENCOUNTER — Ambulatory Visit (HOSPITAL_COMMUNITY): Payer: PPO | Admitting: Occupational Therapy

## 2024-01-28 DIAGNOSIS — R278 Other lack of coordination: Secondary | ICD-10-CM

## 2024-01-28 DIAGNOSIS — R29818 Other symptoms and signs involving the nervous system: Secondary | ICD-10-CM

## 2024-01-28 NOTE — Therapy (Signed)
 OUTPATIENT OCCUPATIONAL THERAPY NEURO TREATMENT NOTE  Patient Name: Joshua Compton. MRN: [id number] DOB:[birth year], 83 y.o., male Today's Date: 01/28/2024    END OF SESSION:  OT End of Session - 01/28/24 1106     Visit Number 18    Number of Visits 22    Date for OT Re-Evaluation 02/19/24    Authorization Type [employer], $20 copay    Authorization Time Period no auth required    Progress Note Due on Visit 20    OT Start Time 1024    OT Stop Time 1106    OT Time Calculation (min) 42 min    Activity Tolerance Patient tolerated treatment well    Behavior During Therapy WFL for tasks assessed/performed             Past Medical History:  Diagnosis Date   Arthritis    Cancer (HCC)    prostate ca   seed implants   GERD (gastroesophageal reflux disease)    Glaucoma    Hyperlipidemia    Hypertension    dr Mod[place] Charon     [place]   fm   Stroke [facility]) 07/2023   Past Surgical History:  Procedure Laterality Date   BACK SURGERY     x2   COLONOSCOPY     HERNIA REPAIR  11-06-2009   left inguinal repair    NECK SURGERY     ROTATOR CUFF REPAIR Left jan 2016   TOTAL HIP ARTHROPLASTY Right 04/20/2020   Procedure: RIGHT TOTAL HIP ARTHROPLASTY ANTERIOR APPROACH;  Surgeon: Tarry Kos, MD;  Location: [facility] OR;  Service: Orthopedics;  Laterality: Right;   TOTAL HIP ARTHROPLASTY Left 01/07/2021   Procedure: LEFT TOTAL HIP ARTHROPLASTY ANTERIOR APPROACH;  Surgeon: Tarry Kos, MD;  Location: [facility] OR;  Service: Orthopedics;  Laterality: Left;   TRANSURETHRAL RESECTION OF PROSTATE     Patient Active Problem List   Diagnosis Date Noted   Centrilobular emphysema (HCC) 12/22/2023   High risk medication use 09/18/2023   Vitamin D deficiency 09/18/2023   Supplemental oxygen dependent 09/18/2023   Acute HFrEF (heart failure with reduced ejection fraction) (HCC) 09/08/2023   PAF (paroxysmal atrial fibrillation) (HCC) 09/07/2023   Acute respiratory failure with hypoxia (HCC) 09/05/2023    Acute heart failure with preserved ejection fraction (HFpEF) (HCC) 09/05/2023   Lobar pneumonia (HCC) 09/05/2023   Cerebrovascular accident (CVA) due to embolism of left middle cerebral artery (HCC) 08/10/2023   Paresthesia of both feet 07/28/2023   Gastroesophageal reflux disease with esophagitis without hemorrhage 09/10/2021   Primary insomnia 09/10/2021   Vasculogenic erectile dysfunction 09/10/2021   Status post total replacement of left hip 01/07/2021   Primary osteoarthritis of left hip 01/06/2021   Leg cramps 12/11/2020   Thyromegaly 05/19/2017   Neoplasm of scalp 12/08/2013   Rhinitis 12/08/2013   Cough 12/08/2013   Hyperlipidemia    Cancer (HCC)    Arthritis    Severe stage glaucoma 03/26/2012   Nuclear sclerosis 03/26/2012   Primary open angle glaucoma 10/27/2011   Essential hypertension 02/04/2011   Degenerative disc disease 02/04/2011   Gastrointestinal bleed 02/04/2011   Anemia 02/04/2011   Prostate cancer (HCC) 02/04/2011   PCP: Dr. [name] REFERRING PROVIDER: Dr. [name]  ONSET DATE: 08/07/23  REFERRING DIAG: I69.998,R53.1 (ICD-10-CM) - Weakness as late effect of cerebrovascular accident (CVA)   THERAPY DIAG:  Other lack of coordination  Other symptoms and signs involving the nervous system  Rationale for Evaluation and Treatment: Rehabilitation  SUBJECTIVE:  SUBJECTIVE STATEMENT: S: "I'm working more on my exercises at home."  PERTINENT HISTORY: Pt is an 83 y/o male who reports he had a stroke on September 19th. From [facility]. Left sided CVA impacting the R side.   PRECAUTIONS: Fall  WEIGHT BEARING RESTRICTIONS: No  PAIN:  Are you having pain? No  FALLS: Has patient fallen in last 6 months? No  PLOF: Independent  PATIENT GOALS: To get stronger and resume independence in ADLs.   OBJECTIVE:  Note: Objective measures were completed at Evaluation unless otherwise noted.  HAND DOMINANCE: Right  ADLs: Overall ADLs: Pt  reports difficulty with gripping items, pt is unable to use a toothbrush or razor, is using LUE for ADLs. Pt is unable to tie his shoes, has not tried to hold a pencil. Using left hand to feed himself.  Pt enjoys golfing, fishing, and yardwork.    FUNCTIONAL OUTCOME MEASURES: FOTO: 78/100 01/13/24: 88.71/100  UPPER EXTREMITY ROM:      A/ROM of BUE is [facility]  UPPER EXTREMITY MMT:     MMT Right eval Right 12/07/23 Right 01/13/24  Shoulder flexion 5/5 5/5 5/5  Shoulder abduction 4/5 (same as LUE) 4+/5 5/5  Shoulder internal rotation 5/5 5/5 5/5  Shoulder external rotation 5/5 5/5 5/5  Elbow flexion 5/5 5/5 5/5  Elbow extension 5/5 5/5 5/5  Wrist flexion 4/5 5/5 5/5  Wrist extension 3+/5 4/5 4+/5  Wrist ulnar deviation 3+/5 5/5 5/5  Wrist radial deviation 3+/5 4+/5 4+/5  Wrist pronation 5/5 5/5 5/5  Wrist supination 5/5 5/5 5/5  (Blank rows = not tested)  HAND FUNCTION: Grip strength: Right: 22 lbs; Left: 110 lbs, Lateral pinch: Right: 4 lbs, Left: 26 lbs, and 3 point pinch: Right: unable lbs, Left: 17 lbs 12/07/23: Grip strength: Right: 41 lbs; Lateral pinch: Right: 8 lbs, and 3 point pinch: Right: 6 lbs 01/13/24: Grip strength: Right: 46 lbs; Lateral pinch: Right: 9 lbs, and 3 point pinch: Right: 6 lbs  COORDINATION: 9 Hole Peg test: Right: 1'48" with set-up sec; Left: 29.94 sec 12/07/23: 9 Hole Peg test: Right: 7'44" sec - on no slip mat 01/13/24: 9 Hole Peg test: Right: 4'55" sec - on no slip mat  EDEMA: mild edema in hand  TODAY'S TREATMENT:                                                                                                                              DATE:   01/28/24 -Wrist Strengthening: 2#, flexion, extension, ulnar/radial deviation, supination/pronation, x12 -Digit ROM: composite flexion, abduction, finger taps, opposition, x12 -Digit Isolation: flicking cube D1/D2 x10 (restrained all other digits with theraband - mod difficulty) flicking cube D1/D3 x10 (all  fingers move - mod difficulty) -Coins: sliding to edge of table and picking up 10 pennies, 3 nickels, and 2 dimes, placing in piggy bank slot (light min difficulty) - attempted to pick up penny and nickel straight from table - unable to pick  up from table despite multiple attempts -Nuts and Bolts: unscrewing and screwing back on large and medium nuts from matching bolts  01/25/24 -Theraputty: red putty, roll into a ball, flatten into a pancake with weight shifting through arm, PVC pipe using wrist flexion/extension to cut circles, roll into log, tripod pinch x10 (mod difficulty), lateral pinch x10, roll into a ball, squeeze x10 -Pinching: stacking 6 cubes with tip to tip pinch (mod difficulty), using red resistance clip (max difficulty), switched to yellow resistance clip (mod difficulty), to stack 4 -Coins: flipping coins from tails to heads, picking up 1 coin at a time and placing in tootsie roll bank slot -Digit ROM: finger taps, opposition, x10 each digit , min to mod assist -Gripper: 25# vertically picking up 8 medium beads, 29# vertically picking up 6 medium beads  01/19/24 -Theraputty: weightshifting while flattening putty, 2 rounds, ~3 minutes each -Sponges: working on isolating index finger while holding sponges in gross grasp, 4, 5, 6 -Grip strengthening: all large beads with gripper horizontal at 29#, all medium beads hand gripper horizontal at 25# -A/ROM: finger taps, tendon glides, 10 reps -Pinch strengthening: pt using lateral pinch and red clothespin to grasp sponges and stack into towers of 4. Unable to stack, downgraded activity to grasping and replacing into bucket. Pt initially able to open clothespin fully, decreased with fatigue.  -Large pegboard: pt reaching out or overhead to grasp pegs from OT, working to place into vertical large pegboard. Min difficulty grasping and holding in lateral pinch today, slightly increased time for placing 9 pegs. Removing pegs 2 at a time, min  difficulty.  -Overhead lacing: seated, lacing from top down approximately 10 links with increased time   PATIENT EDUCATION: Education details: Continue HEP Person educated: Patient Education method: Programmer, multimedia, Demonstration, and Handouts Education comprehension: verbalized understanding and returned demonstration  HOME EXERCISE PROGRAM: Eval: yellow theraputty-grip and pinch strengthening 12/17: Wrist and digit ROM 12/27: pinch activities  1/24: Digit Isolation 1/31: Get "chip clips" and work on tip to tip pinch 2/26: Bait fish hooks 3/10: restart finger taps and opposition   GOALS: Goals reviewed with patient? Yes  SHORT TERM GOALS: Target date: 11/27/23  Pt will be provided with and educated on HEP to improve mobility and use of RUE required for ADL completion.   Goal status: IN PROGRESS  2.  Pt will increase RUE strength to 4+/5 to improve ability to perform yardwork and fishing activities using RUE as dominant.   Goal status: MET  3.  Pt will increase right grip strength by 30+ pounds and pinch strength by 10+ pounds to improve ability to grip and utilize tools such as a toothbrush or razor with the right hand.   Goal status: IN PROGRESS  4.  Pt will improve RUE coordination required for tying shoes and operate fishing gear by completing 9 hole peg test in under 1'.   Goal status: IN PROGRESS    ASSESSMENT:  CLINICAL IMPRESSION: Pt presenting with increased fatigue this session, requiring increased time and increased difficulty when completing fine motor tasks. He had increased frustration when working on coins and nuts and bolts this session due to increased difficulty. Overall shoulder, elbow, and wrist control are [facility] and his main deficit is grip/pinch and coordination. OT providing verbal and tactile cuing for positioning and technique throughout session.   PERFORMANCE DEFICITS: in functional skills including ADLs, IADLs, coordination, dexterity, edema, tone,  ROM, strength, and UE functional use   PLAN:  OT FREQUENCY: 2x/week  OT DURATION: 4 weeks  PLANNED INTERVENTIONS: 97168 OT Re-evaluation, 97535 self care/ADL training, 16109 therapeutic exercise, 97530 therapeutic activity, 97112 neuromuscular re-education, 97140 manual therapy, 97018 paraffin, 60454 electrical stimulation (manual), patient/family education, and DME and/or AE instructions  RECOMMENDED OTHER SERVICES: PT evaluation  CONSULTED AND AGREED WITH PLAN OF CARE: Patient  PLAN FOR NEXT SESSION: tendon gliding, grip and pinch strengthening, fine motor work   [employer], OTR/L [phone]  01/28/2024, 1:12 PM

## 2024-01-30 ENCOUNTER — Encounter: Payer: Self-pay | Admitting: Family Medicine

## 2024-01-30 DIAGNOSIS — K219 Gastro-esophageal reflux disease without esophagitis: Secondary | ICD-10-CM | POA: Diagnosis not present

## 2024-01-30 DIAGNOSIS — J309 Allergic rhinitis, unspecified: Secondary | ICD-10-CM | POA: Diagnosis not present

## 2024-01-30 DIAGNOSIS — G2581 Restless legs syndrome: Secondary | ICD-10-CM | POA: Diagnosis not present

## 2024-01-30 DIAGNOSIS — I110 Hypertensive heart disease with heart failure: Secondary | ICD-10-CM | POA: Diagnosis not present

## 2024-01-30 DIAGNOSIS — J439 Emphysema, unspecified: Secondary | ICD-10-CM | POA: Diagnosis not present

## 2024-01-30 DIAGNOSIS — I4891 Unspecified atrial fibrillation: Secondary | ICD-10-CM | POA: Diagnosis not present

## 2024-01-30 DIAGNOSIS — E785 Hyperlipidemia, unspecified: Secondary | ICD-10-CM | POA: Diagnosis not present

## 2024-01-30 DIAGNOSIS — I5022 Chronic systolic (congestive) heart failure: Secondary | ICD-10-CM | POA: Diagnosis not present

## 2024-01-30 DIAGNOSIS — E663 Overweight: Secondary | ICD-10-CM | POA: Diagnosis not present

## 2024-01-30 DIAGNOSIS — D6869 Other thrombophilia: Secondary | ICD-10-CM | POA: Diagnosis not present

## 2024-01-30 DIAGNOSIS — H409 Unspecified glaucoma: Secondary | ICD-10-CM | POA: Diagnosis not present

## 2024-01-30 DIAGNOSIS — E559 Vitamin D deficiency, unspecified: Secondary | ICD-10-CM | POA: Diagnosis not present

## 2024-02-01 ENCOUNTER — Ambulatory Visit (HOSPITAL_COMMUNITY): Payer: PPO | Admitting: Occupational Therapy

## 2024-02-01 ENCOUNTER — Ambulatory Visit (INDEPENDENT_AMBULATORY_CARE_PROVIDER_SITE_OTHER): Admitting: Family Medicine

## 2024-02-01 ENCOUNTER — Encounter (HOSPITAL_COMMUNITY): Payer: Self-pay | Admitting: Occupational Therapy

## 2024-02-01 VITALS — BP 150/67 | HR 53 | Temp 98.4°F | Ht 71.0 in | Wt 201.0 lb

## 2024-02-01 DIAGNOSIS — N1832 Chronic kidney disease, stage 3b: Secondary | ICD-10-CM | POA: Insufficient documentation

## 2024-02-01 DIAGNOSIS — R29818 Other symptoms and signs involving the nervous system: Secondary | ICD-10-CM

## 2024-02-01 DIAGNOSIS — L97929 Non-pressure chronic ulcer of unspecified part of left lower leg with unspecified severity: Secondary | ICD-10-CM

## 2024-02-01 DIAGNOSIS — I83029 Varicose veins of left lower extremity with ulcer of unspecified site: Secondary | ICD-10-CM

## 2024-02-01 DIAGNOSIS — R278 Other lack of coordination: Secondary | ICD-10-CM | POA: Diagnosis not present

## 2024-02-01 DIAGNOSIS — I10 Essential (primary) hypertension: Secondary | ICD-10-CM

## 2024-02-01 DIAGNOSIS — I872 Venous insufficiency (chronic) (peripheral): Secondary | ICD-10-CM | POA: Insufficient documentation

## 2024-02-01 DIAGNOSIS — N1831 Chronic kidney disease, stage 3a: Secondary | ICD-10-CM | POA: Insufficient documentation

## 2024-02-01 MED ORDER — TORSEMIDE 20 MG PO TABS: 60.0000 mg | ORAL_TABLET | Freq: Every day | ORAL | Status: DC

## 2024-02-01 NOTE — Therapy (Signed)
 OUTPATIENT OCCUPATIONAL THERAPY NEURO TREATMENT NOTE  Patient Name: Joshua Compton. MRN: [id number] DOB:Jan 15, 1941, 83 y.o., male Today's Date: 02/01/2024    END OF SESSION:  OT End of Session - 02/01/24 1103     Visit Number 19    Number of Visits 22    Date for OT Re-Evaluation 02/19/24    Authorization Type [employer], $20 copay    Authorization Time Period no auth required    Progress Note Due on Visit 20    OT Start Time 1017    OT Stop Time 1102    OT Time Calculation (min) 45 min    Activity Tolerance Patient tolerated treatment well    Behavior During Therapy WFL for tasks assessed/performed              Past Medical History:  Diagnosis Date   Arthritis    Cancer (HCC)    prostate ca   seed implants   GERD (gastroesophageal reflux disease)    Glaucoma    Hyperlipidemia    Hypertension    dr [place] Charon     [place]   fm   Stroke (HCC) 07/2023   Past Surgical History:  Procedure Laterality Date   BACK SURGERY     x2   COLONOSCOPY     HERNIA REPAIR  11-06-2009   left inguinal repair    NECK SURGERY     ROTATOR CUFF REPAIR Left jan 2016   TOTAL HIP ARTHROPLASTY Right 04/20/2020   Procedure: RIGHT TOTAL HIP ARTHROPLASTY ANTERIOR APPROACH;  Surgeon: Tarry Kos, MD;  Location: [facility] OR;  Service: Orthopedics;  Laterality: Right;   TOTAL HIP ARTHROPLASTY Left 01/07/2021   Procedure: LEFT TOTAL HIP ARTHROPLASTY ANTERIOR APPROACH;  Surgeon: Tarry Kos, MD;  Location: [facility] OR;  Service: Orthopedics;  Laterality: Left;   TRANSURETHRAL RESECTION OF PROSTATE     Patient Active Problem List   Diagnosis Date Noted   Centrilobular emphysema (HCC) 12/22/2023   High risk medication use 09/18/2023   Vitamin D deficiency 09/18/2023   Supplemental oxygen dependent 09/18/2023   Acute HFrEF (heart failure with reduced ejection fraction) (HCC) 09/08/2023   PAF (paroxysmal atrial fibrillation) (HCC) 09/07/2023   Acute respiratory failure with hypoxia (HCC)  09/05/2023   Acute heart failure with preserved ejection fraction (HFpEF) (HCC) 09/05/2023   Lobar pneumonia (HCC) 09/05/2023   Cerebrovascular accident (CVA) due to embolism of left middle cerebral artery (HCC) 08/10/2023   Paresthesia of both feet 07/28/2023   Gastroesophageal reflux disease with esophagitis without hemorrhage 09/10/2021   Primary insomnia 09/10/2021   Vasculogenic erectile dysfunction 09/10/2021   Status post total replacement of left hip 01/07/2021   Primary osteoarthritis of left hip 01/06/2021   Leg cramps 12/11/2020   Thyromegaly 05/19/2017   Neoplasm of scalp 12/08/2013   Rhinitis 12/08/2013   Cough 12/08/2013   Hyperlipidemia    Cancer (HCC)    Arthritis    Severe stage glaucoma 03/26/2012   Nuclear sclerosis 03/26/2012   Primary open angle glaucoma 10/27/2011   Essential hypertension 02/04/2011   Degenerative disc disease 02/04/2011   Gastrointestinal bleed 02/04/2011   Anemia 02/04/2011   Prostate cancer (HCC) 02/04/2011   PCP: Dr. [name] REFERRING PROVIDER: Dr. [name]  ONSET DATE: 08/07/23  REFERRING DIAG: I69.998,R53.1 (ICD-10-CM) - Weakness as late effect of cerebrovascular accident (CVA)   THERAPY DIAG:  Other lack of coordination  Other symptoms and signs involving the nervous system  Rationale for Evaluation and Treatment: Rehabilitation  SUBJECTIVE:  SUBJECTIVE STATEMENT: S: "I'm working more on my exercises at home."  PERTINENT HISTORY: Pt is an 83 y/o male who reports he had a stroke on September 19th. From [facility]. Left sided CVA impacting the R side.   PRECAUTIONS: Fall  WEIGHT BEARING RESTRICTIONS: No  PAIN:  Are you having pain? No  FALLS: Has patient fallen in last 6 months? No  PLOF: Independent  PATIENT GOALS: To get stronger and resume independence in ADLs.   OBJECTIVE:  Note: Objective measures were completed at Evaluation unless otherwise noted.  HAND DOMINANCE: Right  ADLs: Overall  ADLs: Pt reports difficulty with gripping items, pt is unable to use a toothbrush or razor, is using LUE for ADLs. Pt is unable to tie his shoes, has not tried to hold a pencil. Using left hand to feed himself.  Pt enjoys golfing, fishing, and yardwork.    FUNCTIONAL OUTCOME MEASURES: FOTO: 78/100 01/13/24: 88.71/100  UPPER EXTREMITY ROM:      A/ROM of BUE is [facility]  UPPER EXTREMITY MMT:     MMT Right eval Right 12/07/23 Right 01/13/24  Shoulder flexion 5/5 5/5 5/5  Shoulder abduction 4/5 (same as LUE) 4+/5 5/5  Shoulder internal rotation 5/5 5/5 5/5  Shoulder external rotation 5/5 5/5 5/5  Elbow flexion 5/5 5/5 5/5  Elbow extension 5/5 5/5 5/5  Wrist flexion 4/5 5/5 5/5  Wrist extension 3+/5 4/5 4+/5  Wrist ulnar deviation 3+/5 5/5 5/5  Wrist radial deviation 3+/5 4+/5 4+/5  Wrist pronation 5/5 5/5 5/5  Wrist supination 5/5 5/5 5/5  (Blank rows = not tested)  HAND FUNCTION: Grip strength: Right: 22 lbs; Left: 110 lbs, Lateral pinch: Right: 4 lbs, Left: 26 lbs, and 3 point pinch: Right: unable lbs, Left: 17 lbs 12/07/23: Grip strength: Right: 41 lbs; Lateral pinch: Right: 8 lbs, and 3 point pinch: Right: 6 lbs 01/13/24: Grip strength: Right: 46 lbs; Lateral pinch: Right: 9 lbs, and 3 point pinch: Right: 6 lbs  COORDINATION: 9 Hole Peg test: Right: 1'48" with set-up sec; Left: 29.94 sec 12/07/23: 9 Hole Peg test: Right: 7'44" sec - on no slip mat 01/13/24: 9 Hole Peg test: Right: 4'55" sec - on no slip mat  EDEMA: mild edema in hand  TODAY'S TREATMENT:                                                                                                                              DATE:   02/01/24 -Building a table -Max difficulty with problem solving and perception - requiring max cuing to follow the directions put the pieces in proper order.  -Mod difficulty physically putting the pieces together and screwing the nuts on the bolts using BUE. Needing max cuing to use RUE and mod  assist periodically to hold pieces in place -Min difficulty unscrewing all pieces using mainly RUE  01/28/24 -Wrist Strengthening: 2#, flexion, extension, ulnar/radial deviation, supination/pronation, x12 -Digit ROM: composite flexion, abduction, finger taps,  opposition, x12 -Digit Isolation: flicking cube D1/D2 x10 (restrained all other digits with theraband - mod difficulty) flicking cube D1/D3 x10 (all fingers move - mod difficulty) -Coins: sliding to edge of table and picking up 10 pennies, 3 nickels, and 2 dimes, placing in piggy bank slot (light min difficulty) - attempted to pick up penny and nickel straight from table - unable to pick up from table despite multiple attempts -Nuts and Bolts: unscrewing and screwing back on large and medium nuts from matching bolts  01/25/24 -Theraputty: red putty, roll into a ball, flatten into a pancake with weight shifting through arm, PVC pipe using wrist flexion/extension to cut circles, roll into log, tripod pinch x10 (mod difficulty), lateral pinch x10, roll into a ball, squeeze x10 -Pinching: stacking 6 cubes with tip to tip pinch (mod difficulty), using red resistance clip (max difficulty), switched to yellow resistance clip (mod difficulty), to stack 4 -Coins: flipping coins from tails to heads, picking up 1 coin at a time and placing in tootsie roll bank slot -Digit ROM: finger taps, opposition, x10 each digit , min to mod assist -Gripper: 25# vertically picking up 8 medium beads, 29# vertically picking up 6 medium beads   PATIENT EDUCATION: Education details: Continue HEP Person educated: Patient Education method: Programmer, multimedia, Facilities manager, and Handouts Education comprehension: verbalized understanding and returned demonstration  HOME EXERCISE PROGRAM: Eval: yellow theraputty-grip and pinch strengthening 12/17: Wrist and digit ROM 12/27: pinch activities  1/24: Digit Isolation 1/31: Get "chip clips" and work on tip to tip pinch 2/26:  Bait fish hooks 3/10: restart finger taps and opposition   GOALS: Goals reviewed with patient? Yes  SHORT TERM GOALS: Target date: 11/27/23  Pt will be provided with and educated on HEP to improve mobility and use of RUE required for ADL completion.   Goal status: IN PROGRESS  2.  Pt will increase RUE strength to 4+/5 to improve ability to perform yardwork and fishing activities using RUE as dominant.   Goal status: MET  3.  Pt will increase right grip strength by 30+ pounds and pinch strength by 10+ pounds to improve ability to grip and utilize tools such as a toothbrush or razor with the right hand.   Goal status: IN PROGRESS  4.  Pt will improve RUE coordination required for tying shoes and operate fishing gear by completing 9 hole peg test in under 1'.   Goal status: IN PROGRESS    ASSESSMENT:  CLINICAL IMPRESSION: This session focused heavily on functional skills, along with cognition for following directions, perception, and sequencing. He had max difficulty cognitively, requiring mod to max verbal cuing for all directions and sequencing of putting the table together. Physically, he had mod difficulty with all the multiple pieces and screwing on the nuts to hold everything in place. OT providing periodic mod assist for screwing on the nuts and holding certain items in place.   PERFORMANCE DEFICITS: in functional skills including ADLs, IADLs, coordination, dexterity, edema, tone, ROM, strength, and UE functional use   PLAN:  OT FREQUENCY: 2x/week  OT DURATION: 4 weeks  PLANNED INTERVENTIONS: 97168 OT Re-evaluation, 97535 self care/ADL training, 84132 therapeutic exercise, 97530 therapeutic activity, 97112 neuromuscular re-education, 97140 manual therapy, 97018 paraffin, 44010 electrical stimulation (manual), patient/family education, and DME and/or AE instructions  RECOMMENDED OTHER SERVICES: PT evaluation  CONSULTED AND AGREED WITH PLAN OF CARE: Patient  PLAN FOR  NEXT SESSION: tendon gliding, grip and pinch strengthening, fine motor work   [employer], OTR/L [phone]  02/01/2024, 11:03 AM

## 2024-02-01 NOTE — Progress Notes (Unsigned)
 Subjective:  Patient ID: Joshua Najjar., male    DOB: 03/12/1941  Age: 83 y.o. MRN: [id number]  CC: boot removal   HPI Joshua Mineer. presents for recheck of left leg stasis ulcer. Was treated for a week with unna boot. Here for removal and reassessment. Feels better. Swelling has decreased.    presents for  follow-up of hypertension. Patient has no history of headache chest pain or shortness of breath or recent cough. Patient also denies symptoms of TIA such as focal numbness or weakness. Patient denies side effects from medication. States taking it regularly.      02/01/2024    3:18 PM 10/19/2023    1:08 PM 07/28/2023    8:57 AM  Depression screen PHQ 2/9  Decreased Interest 0 0 0  Down, Depressed, Hopeless 0 0 0  PHQ - 2 Score 0 0 0  Altered sleeping 0    Tired, decreased energy 0    Change in appetite 0    Feeling bad or failure about yourself  0    Trouble concentrating 0    Moving slowly or fidgety/restless 0    Suicidal thoughts 0    PHQ-9 Score 0    Difficult doing work/chores Not difficult at all      History Joshua Compton has a past medical history of Arthritis, Cancer (HCC), GERD (gastroesophageal reflux disease), Glaucoma, Hyperlipidemia, Hypertension, and Stroke (HCC) (07/2023).   He has a past surgical history that includes Back surgery; Neck surgery; Transurethral resection of prostate; Hernia repair (11-06-2009); Rotator cuff repair (Left, jan 2016); Colonoscopy; Total hip arthroplasty (Right, 04/20/2020); and Total hip arthroplasty (Left, 01/07/2021).   His family history includes Cancer in his brother, brother, and mother; Heart disease in his father.He reports that he quit smoking about 18 years ago. His smoking use included cigarettes. He started smoking about 48 years ago. He has a 15 pack-year smoking history. He has never used smokeless tobacco. He reports that he does not drink alcohol and does not use drugs.    ROS Review of Systems   Constitutional:  Negative for fever.  Respiratory:  Negative for shortness of breath.   Cardiovascular:  Negative for chest pain.  Musculoskeletal:  Negative for arthralgias.  Skin:  Negative for rash.    Objective:  BP (!) 150/67   Pulse (!) 53   Temp 98.4 F (36.9 C)   Ht 5\' 11"  (1.803 m)   Wt 201 lb (91.2 kg)   SpO2 92%   BMI 28.03 kg/m   BP Readings from Last 3 Encounters:  02/01/24 (!) 150/67  01/25/24 (!) 158/57  01/07/24 (!) 141/50    Wt Readings from Last 3 Encounters:  02/01/24 201 lb (91.2 kg)  01/25/24 203 lb (92.1 kg)  01/21/24 204 lb 9.6 oz (92.8 kg)     Physical Exam HENT:     Head: Normocephalic.  Cardiovascular:     Rate and Rhythm: Normal rate and regular rhythm.  Pulmonary:     Breath sounds: Normal breath sounds.  Musculoskeletal:     Right lower leg: Edema (1-2+) present.     Left lower leg: Edema (1+) present.  Skin:    Findings: Lesion (after removal of the bandage, there is noted complete coverage of the ulcer on the left shin. There is one remaining lesion that remains crusted.) present.     Assessment & Plan:   Joshua Compton was seen today for boot removal.  Diagnoses and all orders for this visit:  Stasis  ulcer of lower extremity, left (HCC)  Venous insufficiency of both lower extremities -     torsemide (DEMADEX) 20 MG tablet; Take 3 tablets (60 mg total) by mouth daily. -     BMP8+EGFR  Essential hypertension -     BMP8+EGFR  Chronic renal impairment, stage 3b (HCC) -     torsemide (DEMADEX) 20 MG tablet; Take 3 tablets (60 mg total) by mouth daily. -     BMP8+EGFR   Use neosporin and gauze on the sores daily until clear. Monitor for pain redness and drainage as these could mean infection.    I have changed Joshua Lewandowsky Jr.'s torsemide. I am also having him maintain his dorzolamide-timolol, pantoprazole, amiodarone, fluticasone, cholecalciferol, atorvastatin, apixaban, Entresto, dapagliflozin propanediol, rOPINIRole,  acetaminophen, metoprolol succinate, and Trelegy Ellipta.  Allergies as of 02/01/2024       Reactions   Iodinated Contrast Media Anaphylaxis   Tongue swelled up when he had MRI in [facility] Intubated for airway protection after oral and throat swelling   Multihance [gadobenate] Swelling   Tongue swelled up when he had MRI in [facility]   Pravastatin Other (See Comments)   Muscle aches   Pravastatin Sodium    Muscle aches        Medication List        Accurate as of February 01, 2024 11:59 PM. If you have any questions, ask your nurse or doctor.          acetaminophen 500 MG tablet Commonly known as: TYLENOL Take 1,000 mg by mouth every 6 (six) hours as needed for mild pain (pain score 1-3).   amiodarone 200 MG tablet Commonly known as: PACERONE Take 1 tablet (200 mg total) by mouth 2 (two) times daily. X 20 days, then once daily starting 09/28/23   apixaban 5 MG Tabs tablet Commonly known as: Eliquis Take 1 tablet (5 mg total) by mouth 2 (two) times daily.   atorvastatin 40 MG tablet Commonly known as: LIPITOR TAKE 1 TABLET BY MOUTH EVERYDAY AT BEDTIME   cholecalciferol 25 MCG (1000 UNIT) tablet Commonly known as: VITAMIN D3 TAKE 1 TABLET BY MOUTH EVERY DAY   dapagliflozin propanediol 10 MG Tabs tablet Commonly known as: Farxiga Take 1 tablet (10 mg total) by mouth daily.   dorzolamide-timolol 2-0.5 % ophthalmic solution Commonly known as: COSOPT Place 1 drop into both eyes daily.   Entresto 24-26 MG Generic drug: sacubitril-valsartan Take 1 tablet by mouth 2 (two) times daily.   fluticasone 50 MCG/ACT nasal spray Commonly known as: FLONASE SPRAY 2 SPRAYS INTO EACH NOSTRIL EVERY DAY What changed: See the new instructions.   metoprolol succinate 25 MG 24 hr tablet Commonly known as: TOPROL-XL TAKE 0.5 TABLETS (12.5 MG TOTAL) BY MOUTH DAILY. FOR BLOOD PRESSURE CONTROL   pantoprazole 40 MG tablet Commonly known as: PROTONIX Take 1 tablet (40 mg total)  by mouth daily. For heartburn   rOPINIRole 1 MG tablet Commonly known as: REQUIP Take 1 tablet (1 mg total) by mouth 3 (three) times daily. Plus an extra tablet at bedtime to total four a day   torsemide 20 MG tablet Commonly known as: DEMADEX Take 3 tablets (60 mg total) by mouth daily. What changed: how much to take Changed by: Broadus John Ephraim Reichel   Trelegy Ellipta 100-62.5-25 MCG/ACT Aepb Generic drug: Fluticasone-Umeclidin-Vilant Inhale 1 puff into the lungs daily.         Follow-up: Return in about 2 weeks (around 02/15/2024).  [name], M.D.

## 2024-02-02 ENCOUNTER — Encounter: Payer: Self-pay | Admitting: Family Medicine

## 2024-02-02 ENCOUNTER — Other Ambulatory Visit: Payer: Self-pay | Admitting: Family Medicine

## 2024-02-02 LAB — BMP8+EGFR
BUN/Creatinine Ratio: 12 (ref 10–24)
BUN: 21 mg/dL (ref 8–27)
CO2: 26 mmol/L (ref 20–29)
Calcium: 9.7 mg/dL (ref 8.6–10.2)
Chloride: 100 mmol/L (ref 96–106)
Creatinine, Ser: 1.69 mg/dL — ABNORMAL HIGH (ref 0.76–1.27)
Glucose: 105 mg/dL — ABNORMAL HIGH (ref 70–99)
Potassium: 3.2 mmol/L — ABNORMAL LOW (ref 3.5–5.2)
Sodium: 144 mmol/L (ref 134–144)
eGFR: 40 mL/min/{1.73_m2} — ABNORMAL LOW (ref >59)

## 2024-02-02 MED ORDER — POTASSIUM CHLORIDE CRYS ER 20 MEQ PO TBCR: 20.0000 meq | EXTENDED_RELEASE_TABLET | Freq: Every day | ORAL | 3 refills | Status: DC

## 2024-02-04 ENCOUNTER — Encounter (HOSPITAL_COMMUNITY): Payer: Self-pay | Admitting: Occupational Therapy

## 2024-02-04 ENCOUNTER — Ambulatory Visit (HOSPITAL_COMMUNITY): Payer: PPO | Admitting: Occupational Therapy

## 2024-02-04 DIAGNOSIS — R29818 Other symptoms and signs involving the nervous system: Secondary | ICD-10-CM

## 2024-02-04 DIAGNOSIS — R278 Other lack of coordination: Secondary | ICD-10-CM | POA: Diagnosis not present

## 2024-02-04 NOTE — Therapy (Signed)
 OUTPATIENT OCCUPATIONAL THERAPY NEURO TREATMENT NOTE  Patient Name: Joshua Compton. MRN: [id number] DOB:1941/11/10, 83 y.o., male Today's Date: 02/04/2024    END OF SESSION:  OT End of Session - 02/04/24 1107     Visit Number 20    Number of Visits 22    Date for OT Re-Evaluation 02/19/24    Authorization Type [employer], $20 copay    Authorization Time Period no auth required    Progress Note Due on Visit 20    OT Start Time 1021    OT Stop Time 1104    OT Time Calculation (min) 43 min    Activity Tolerance Patient tolerated treatment well    Behavior During Therapy WFL for tasks assessed/performed             Past Medical History:  Diagnosis Date   Arthritis    Cancer (HCC)    prostate ca   seed implants   GERD (gastroesophageal reflux disease)    Glaucoma    Hyperlipidemia    Hypertension    dr Modesto Charon     [facility]   fm   Stroke (HCC) 07/2023   Past Surgical History:  Procedure Laterality Date   BACK SURGERY     x2   COLONOSCOPY     HERNIA REPAIR  11-06-2009   left inguinal repair    NECK SURGERY     ROTATOR CUFF REPAIR Left jan 2016   TOTAL HIP ARTHROPLASTY Right 04/20/2020   Procedure: RIGHT TOTAL HIP ARTHROPLASTY ANTERIOR APPROACH;  Surgeon: Tarry Kos, MD;  Location: [facility] OR;  Service: Orthopedics;  Laterality: Right;   TOTAL HIP ARTHROPLASTY Left 01/07/2021   Procedure: LEFT TOTAL HIP ARTHROPLASTY ANTERIOR APPROACH;  Surgeon: Tarry Kos, MD;  Location: [facility] OR;  Service: Orthopedics;  Laterality: Left;   TRANSURETHRAL RESECTION OF PROSTATE     Patient Active Problem List   Diagnosis Date Noted   Venous insufficiency of both lower extremities 02/01/2024   Chronic renal impairment, stage 3b (HCC) 02/01/2024   Centrilobular emphysema (HCC) 12/22/2023   High risk medication use 09/18/2023   Vitamin D deficiency 09/18/2023   Supplemental oxygen dependent 09/18/2023   Acute HFrEF (heart failure with reduced ejection fraction) (HCC) 09/08/2023    PAF (paroxysmal atrial fibrillation) (HCC) 09/07/2023   Acute respiratory failure with hypoxia (HCC) 09/05/2023   Acute heart failure with preserved ejection fraction (HFpEF) (HCC) 09/05/2023   Lobar pneumonia (HCC) 09/05/2023   Cerebrovascular accident (CVA) due to embolism of left middle cerebral artery (HCC) 08/10/2023   Paresthesia of both feet 07/28/2023   Gastroesophageal reflux disease with esophagitis without hemorrhage 09/10/2021   Primary insomnia 09/10/2021   Vasculogenic erectile dysfunction 09/10/2021   Status post total replacement of left hip 01/07/2021   Primary osteoarthritis of left hip 01/06/2021   Leg cramps 12/11/2020   Thyromegaly 05/19/2017   Neoplasm of scalp 12/08/2013   Rhinitis 12/08/2013   Cough 12/08/2013   Hyperlipidemia    Cancer (HCC)    Arthritis    Severe stage glaucoma 03/26/2012   Nuclear sclerosis 03/26/2012   Primary open angle glaucoma 10/27/2011   Essential hypertension 02/04/2011   Degenerative disc disease 02/04/2011   Gastrointestinal bleed 02/04/2011   Anemia 02/04/2011   Prostate cancer (HCC) 02/04/2011   PCP: Dr. [name] REFERRING PROVIDER: Dr. [name]  ONSET DATE: 08/07/23  REFERRING DIAG: J19.147,W29.5 (ICD-10-CM) - Weakness as late effect of cerebrovascular accident (CVA)   THERAPY DIAG:  Other lack of coordination  Other symptoms and signs involving the nervous system  Rationale for Evaluation and Treatment: Rehabilitation  SUBJECTIVE:   SUBJECTIVE STATEMENT: S: "I'm working more on my exercises at home."  PERTINENT HISTORY: Pt is an 83 y/o male who reports he had a stroke on September 19th. From [facility]. Left sided CVA impacting the R side.   PRECAUTIONS: Fall  WEIGHT BEARING RESTRICTIONS: No  PAIN:  Are you having pain? No  FALLS: Has patient fallen in last 6 months? No  PLOF: Independent  PATIENT GOALS: To get stronger and resume independence in ADLs.   OBJECTIVE:  Note: Objective  measures were completed at Evaluation unless otherwise noted.  HAND DOMINANCE: Right  ADLs: Overall ADLs: Pt reports difficulty with gripping items, pt is unable to use a toothbrush or razor, is using LUE for ADLs. Pt is unable to tie his shoes, has not tried to hold a pencil. Using left hand to feed himself.  Pt enjoys golfing, fishing, and yardwork.    FUNCTIONAL OUTCOME MEASURES: FOTO: 78/100 01/13/24: 88.71/100  UPPER EXTREMITY ROM:      A/ROM of BUE is [facility]  UPPER EXTREMITY MMT:     MMT Right eval Right 12/07/23 Right 01/13/24  Shoulder flexion 5/5 5/5 5/5  Shoulder abduction 4/5 (same as LUE) 4+/5 5/5  Shoulder internal rotation 5/5 5/5 5/5  Shoulder external rotation 5/5 5/5 5/5  Elbow flexion 5/5 5/5 5/5  Elbow extension 5/5 5/5 5/5  Wrist flexion 4/5 5/5 5/5  Wrist extension 3+/5 4/5 4+/5  Wrist ulnar deviation 3+/5 5/5 5/5  Wrist radial deviation 3+/5 4+/5 4+/5  Wrist pronation 5/5 5/5 5/5  Wrist supination 5/5 5/5 5/5  (Blank rows = not tested)  HAND FUNCTION: Grip strength: Right: 22 lbs; Left: 110 lbs, Lateral pinch: Right: 4 lbs, Left: 26 lbs, and 3 point pinch: Right: unable lbs, Left: 17 lbs 12/07/23: Grip strength: Right: 41 lbs; Lateral pinch: Right: 8 lbs, and 3 point pinch: Right: 6 lbs 01/13/24: Grip strength: Right: 46 lbs; Lateral pinch: Right: 9 lbs, and 3 point pinch: Right: 6 lbs  COORDINATION: 9 Hole Peg test: Right: 1'48" with set-up sec; Left: 29.94 sec 12/07/23: 9 Hole Peg test: Right: 7'44" sec - on no slip mat 01/13/24: 9 Hole Peg test: Right: 4'55" sec - on no slip mat  EDEMA: mild edema in hand  TODAY'S TREATMENT:                                                                                                                              DATE:   02/04/24 -Theraputty: roll into a ball, squeeze x10, flatten into a pancake, roll into a log, tripod pinch x10, lateral pinch x10, roll into a ball -pinch tree: red, green, blue resistance clips, x7  each, tripod up, lateral down -Tiny Peg board pattern: max difficulty, OT holding small part of pegs and pt pinching from OT's hand to place with mod difficulty placing pegs  and picking them back up  02/01/24 -Building a table -Max difficulty with problem solving and perception - requiring max cuing to follow the directions put the pieces in proper order.  -Mod difficulty physically putting the pieces together and screwing the nuts on the bolts using BUE. Needing max cuing to use RUE and mod assist periodically to hold pieces in place -Min difficulty unscrewing all pieces using mainly RUE  01/28/24 -Wrist Strengthening: 2#, flexion, extension, ulnar/radial deviation, supination/pronation, x12 -Digit ROM: composite flexion, abduction, finger taps, opposition, x12 -Digit Isolation: flicking cube D1/D2 x10 (restrained all other digits with theraband - mod difficulty) flicking cube D1/D3 x10 (all fingers move - mod difficulty) -Coins: sliding to edge of table and picking up 10 pennies, 3 nickels, and 2 dimes, placing in piggy bank slot (light min difficulty) - attempted to pick up penny and nickel straight from table - unable to pick up from table despite multiple attempts -Nuts and Bolts: unscrewing and screwing back on large and medium nuts from matching bolts   PATIENT EDUCATION: Education details: Coin work at home Person educated: Patient Education method: Programmer, multimedia, Facilities manager, and Handouts Education comprehension: verbalized understanding and returned demonstration  HOME EXERCISE PROGRAM: Eval: yellow theraputty-grip and pinch strengthening 12/17: Wrist and digit ROM 12/27: pinch activities  1/24: Digit Isolation 1/31: Get "chip clips" and work on tip to tip pinch 2/26: Bait fish hooks 3/10: restart finger taps and opposition   GOALS: Goals reviewed with patient? Yes  SHORT TERM GOALS: Target date: 11/27/23  Pt will be provided with and educated on HEP to improve mobility  and use of RUE required for ADL completion.   Goal status: IN PROGRESS  2.  Pt will increase RUE strength to 4+/5 to improve ability to perform yardwork and fishing activities using RUE as dominant.   Goal status: MET  3.  Pt will increase right grip strength by 30+ pounds and pinch strength by 10+ pounds to improve ability to grip and utilize tools such as a toothbrush or razor with the right hand.   Goal status: IN PROGRESS  4.  Pt will improve RUE coordination required for tying shoes and operate fishing gear by completing 9 hole peg test in under 1'.   Goal status: IN PROGRESS    ASSESSMENT:  CLINICAL IMPRESSION: This session pt working on overall pinching, both with strengthening and fine motor skills. He is having max difficulty with small items like pegs, coins, etc. As they feel slick and he is not aware when he doesn't pick them up. OT providing assist with fine motor tiny peg board pattern, as well as verbal and tactile cuing for positioning and technique throughout session.   PERFORMANCE DEFICITS: in functional skills including ADLs, IADLs, coordination, dexterity, edema, tone, ROM, strength, and UE functional use   PLAN:  OT FREQUENCY: 2x/week  OT DURATION: 4 weeks  PLANNED INTERVENTIONS: 97168 OT Re-evaluation, 97535 self care/ADL training, 09811 therapeutic exercise, 97530 therapeutic activity, 97112 neuromuscular re-education, 97140 manual therapy, 97018 paraffin, 91478 electrical stimulation (manual), patient/family education, and DME and/or AE instructions  RECOMMENDED OTHER SERVICES: PT evaluation  CONSULTED AND AGREED WITH PLAN OF CARE: Patient  PLAN FOR NEXT SESSION: tendon gliding, grip and pinch strengthening, fine motor work   [employer], OTR/L [phone]  02/04/2024, 11:08 AM

## 2024-02-08 ENCOUNTER — Ambulatory Visit (HOSPITAL_COMMUNITY): Admitting: Occupational Therapy

## 2024-02-08 ENCOUNTER — Encounter (HOSPITAL_COMMUNITY): Payer: Self-pay | Admitting: Occupational Therapy

## 2024-02-08 DIAGNOSIS — R278 Other lack of coordination: Secondary | ICD-10-CM | POA: Diagnosis not present

## 2024-02-08 DIAGNOSIS — R29818 Other symptoms and signs involving the nervous system: Secondary | ICD-10-CM

## 2024-02-08 NOTE — Therapy (Signed)
 OUTPATIENT OCCUPATIONAL THERAPY NEURO TREATMENT NOTE  Patient Name: Joshua Compton. MRN: [id number] DOB:1941-02-24, 83 y.o., male Today's Date: 02/08/2024    END OF SESSION:  OT End of Session - 02/08/24 1128     Visit Number 21    Number of Visits 22    Date for OT Re-Evaluation 02/19/24    Authorization Type [employer], $20 copay    Authorization Time Period no auth required    Progress Note Due on Visit 20    OT Start Time 1019    OT Stop Time 1101    OT Time Calculation (min) 42 min    Activity Tolerance Patient tolerated treatment well    Behavior During Therapy WFL for tasks assessed/performed             Past Medical History:  Diagnosis Date   Arthritis    Cancer (HCC)    prostate ca   seed implants   GERD (gastroesophageal reflux disease)    Glaucoma    Hyperlipidemia    Hypertension    dr Modesto Charon     [place]   fm   Stroke (HCC) 07/2023   Past Surgical History:  Procedure Laterality Date   BACK SURGERY     x2   COLONOSCOPY     HERNIA REPAIR  11-06-2009   left inguinal repair    NECK SURGERY     ROTATOR CUFF REPAIR Left jan 2016   TOTAL HIP ARTHROPLASTY Right 04/20/2020   Procedure: RIGHT TOTAL HIP ARTHROPLASTY ANTERIOR APPROACH;  Surgeon: Tarry Kos, MD;  Location: [facility] OR;  Service: Orthopedics;  Laterality: Right;   TOTAL HIP ARTHROPLASTY Left 01/07/2021   Procedure: LEFT TOTAL HIP ARTHROPLASTY ANTERIOR APPROACH;  Surgeon: Tarry Kos, MD;  Location: [facility] OR;  Service: Orthopedics;  Laterality: Left;   TRANSURETHRAL RESECTION OF PROSTATE     Patient Active Problem List   Diagnosis Date Noted   Venous insufficiency of both lower extremities 02/01/2024   Chronic renal impairment, stage 3b (HCC) 02/01/2024   Centrilobular emphysema (HCC) 12/22/2023   High risk medication use 09/18/2023   Vitamin D deficiency 09/18/2023   Supplemental oxygen dependent 09/18/2023   Acute HFrEF (heart failure with reduced ejection fraction) (HCC) 09/08/2023    PAF (paroxysmal atrial fibrillation) (HCC) 09/07/2023   Acute respiratory failure with hypoxia (HCC) 09/05/2023   Acute heart failure with preserved ejection fraction (HFpEF) (HCC) 09/05/2023   Lobar pneumonia (HCC) 09/05/2023   Cerebrovascular accident (CVA) due to embolism of left middle cerebral artery (HCC) 08/10/2023   Paresthesia of both feet 07/28/2023   Gastroesophageal reflux disease with esophagitis without hemorrhage 09/10/2021   Primary insomnia 09/10/2021   Vasculogenic erectile dysfunction 09/10/2021   Status post total replacement of left hip 01/07/2021   Primary osteoarthritis of left hip 01/06/2021   Leg cramps 12/11/2020   Thyromegaly 05/19/2017   Neoplasm of scalp 12/08/2013   Rhinitis 12/08/2013   Cough 12/08/2013   Hyperlipidemia    Cancer (HCC)    Arthritis    Severe stage glaucoma 03/26/2012   Nuclear sclerosis 03/26/2012   Primary open angle glaucoma 10/27/2011   Essential hypertension 02/04/2011   Degenerative disc disease 02/04/2011   Gastrointestinal bleed 02/04/2011   Anemia 02/04/2011   Prostate cancer (HCC) 02/04/2011   PCP: Dr. [name] REFERRING PROVIDER: Dr. [name]  ONSET DATE: 08/07/23  REFERRING DIAG: V56.433,I95.1 (ICD-10-CM) - Weakness as late effect of cerebrovascular accident (CVA)   THERAPY DIAG:  Other lack of coordination  Other symptoms and signs involving the nervous system  Rationale for Evaluation and Treatment: Rehabilitation  SUBJECTIVE:   SUBJECTIVE STATEMENT: S: "This is taking such a long time to make progress."  PERTINENT HISTORY: Pt is an 83 y/o male who reports he had a stroke on September 19th. From [facility]. Left sided CVA impacting the R side.   PRECAUTIONS: Fall  WEIGHT BEARING RESTRICTIONS: No  PAIN:  Are you having pain? No  FALLS: Has patient fallen in last 6 months? No  PLOF: Independent  PATIENT GOALS: To get stronger and resume independence in ADLs.   OBJECTIVE:  Note:  Objective measures were completed at Evaluation unless otherwise noted.  HAND DOMINANCE: Right  ADLs: Overall ADLs: Pt reports difficulty with gripping items, pt is unable to use a toothbrush or razor, is using LUE for ADLs. Pt is unable to tie his shoes, has not tried to hold a pencil. Using left hand to feed himself.  Pt enjoys golfing, fishing, and yardwork.    FUNCTIONAL OUTCOME MEASURES: FOTO: 78/100 01/13/24: 88.71/100  UPPER EXTREMITY ROM:      A/ROM of BUE is [facility]  UPPER EXTREMITY MMT:     MMT Right eval Right 12/07/23 Right 01/13/24  Shoulder flexion 5/5 5/5 5/5  Shoulder abduction 4/5 (same as LUE) 4+/5 5/5  Shoulder internal rotation 5/5 5/5 5/5  Shoulder external rotation 5/5 5/5 5/5  Elbow flexion 5/5 5/5 5/5  Elbow extension 5/5 5/5 5/5  Wrist flexion 4/5 5/5 5/5  Wrist extension 3+/5 4/5 4+/5  Wrist ulnar deviation 3+/5 5/5 5/5  Wrist radial deviation 3+/5 4+/5 4+/5  Wrist pronation 5/5 5/5 5/5  Wrist supination 5/5 5/5 5/5  (Blank rows = not tested)  HAND FUNCTION: Grip strength: Right: 22 lbs; Left: 110 lbs, Lateral pinch: Right: 4 lbs, Left: 26 lbs, and 3 point pinch: Right: unable lbs, Left: 17 lbs 12/07/23: Grip strength: Right: 41 lbs; Lateral pinch: Right: 8 lbs, and 3 point pinch: Right: 6 lbs 01/13/24: Grip strength: Right: 46 lbs; Lateral pinch: Right: 9 lbs, and 3 point pinch: Right: 6 lbs  COORDINATION: 9 Hole Peg test: Right: 1'48" with set-up sec; Left: 29.94 sec 12/07/23: 9 Hole Peg test: Right: 7'44" sec - on no slip mat 01/13/24: 9 Hole Peg test: Right: 4'55" sec - on no slip mat  EDEMA: mild edema in hand  TODAY'S TREATMENT:                                                                                                                              DATE:   02/08/24 -Digit ROM: composite flexion, abduction, finger taps, opposition, x12 -Digit Isolation: flicking cube D1/D2 x10 (min difficulty - short flicks) flicking cube D1/D3 x10 (all  fingers move - mod difficulty, max difficulty trying to keep D4 and D5 under/flexed), pointing with each digit  (mod difficulty, OT holding other digits down for isolation) -Sponges: 7, 7 -Theraputty: 10 beads placed in  red putty, pt flattening, pinching, and pulling to find all beads - working on pinching to pull each bead out -Coins: sliding to edge of table and picking up 10 pennies, 3 nickels, and 2 dimes, placing in piggy bank slot (light min difficulty) - attempted to pick up penny and nickel straight from table - unable to pick up from table despite multiple attempts  02/04/24 -Theraputty: roll into a ball, squeeze x10, flatten into a pancake, roll into a log, tripod pinch x10, lateral pinch x10, roll into a ball -pinch tree: red, green, blue resistance clips, x7 each, tripod up, lateral down -Tiny Peg board pattern: max difficulty, OT holding small part of pegs and pt pinching from OT's hand to place with mod difficulty placing pegs and picking them back up  02/01/24 -Building a table -Max difficulty with problem solving and perception - requiring max cuing to follow the directions put the pieces in proper order.  -Mod difficulty physically putting the pieces together and screwing the nuts on the bolts using BUE. Needing max cuing to use RUE and mod assist periodically to hold pieces in place -Min difficulty unscrewing all pieces using mainly RUE   PATIENT EDUCATION: Education details: Coin work at home Person educated: Patient Education method: Programmer, multimedia, Facilities manager, and Handouts Education comprehension: verbalized understanding and returned demonstration  HOME EXERCISE PROGRAM: Eval: yellow theraputty-grip and pinch strengthening 12/17: Wrist and digit ROM 12/27: pinch activities  1/24: Digit Isolation 1/31: Get "chip clips" and work on tip to tip pinch 2/26: Bait fish hooks 3/10: restart finger taps and opposition 3/24: Coins at home   GOALS: Goals reviewed with  patient? Yes  SHORT TERM GOALS: Target date: 11/27/23  Pt will be provided with and educated on HEP to improve mobility and use of RUE required for ADL completion.   Goal status: IN PROGRESS  2.  Pt will increase RUE strength to 4+/5 to improve ability to perform yardwork and fishing activities using RUE as dominant.   Goal status: MET  3.  Pt will increase right grip strength by 30+ pounds and pinch strength by 10+ pounds to improve ability to grip and utilize tools such as a toothbrush or razor with the right hand.   Goal status: IN PROGRESS  4.  Pt will improve RUE coordination required for tying shoes and operate fishing gear by completing 9 hole peg test in under 1'.   Goal status: IN PROGRESS    ASSESSMENT:  CLINICAL IMPRESSION: This session pt continuing to work on overall digit isolation and fine motor skills. He continues to struggle due to decreased sensation and poor motor planning. Pt working with small beads and putty for resistance, which was max difficulty due to the poor sensation, as well as working with coins again, where he still struggles to pick up straight from the table. OT providing verbal and tactile cuing throughout session for positioning and technique.   PERFORMANCE DEFICITS: in functional skills including ADLs, IADLs, coordination, dexterity, edema, tone, ROM, strength, and UE functional use   PLAN:  OT FREQUENCY: 2x/week  OT DURATION: 4 weeks  PLANNED INTERVENTIONS: 97168 OT Re-evaluation, 97535 self care/ADL training, 16109 therapeutic exercise, 97530 therapeutic activity, 97112 neuromuscular re-education, 97140 manual therapy, 97018 paraffin, 60454 electrical stimulation (manual), patient/family education, and DME and/or AE instructions  RECOMMENDED OTHER SERVICES: PT evaluation  CONSULTED AND AGREED WITH PLAN OF CARE: Patient  PLAN FOR NEXT SESSION:  Reassessment   Trish Mage, OTR/L [phone]  02/08/2024, 11:29 AM

## 2024-02-12 ENCOUNTER — Telehealth: Payer: Self-pay

## 2024-02-12 ENCOUNTER — Ambulatory Visit (HOSPITAL_COMMUNITY): Admitting: Occupational Therapy

## 2024-02-12 ENCOUNTER — Encounter (HOSPITAL_COMMUNITY): Payer: Self-pay | Admitting: Occupational Therapy

## 2024-02-12 DIAGNOSIS — R278 Other lack of coordination: Secondary | ICD-10-CM | POA: Diagnosis not present

## 2024-02-12 DIAGNOSIS — R29818 Other symptoms and signs involving the nervous system: Secondary | ICD-10-CM

## 2024-02-12 NOTE — Telephone Encounter (Signed)
 Pt would like to est care with Dr. Durene Cal. Pt nephew is a current pt of Dr. Durene Cal, ok to schedule new pt appointment with Dr. Durene Cal.

## 2024-02-12 NOTE — Therapy (Signed)
 OUTPATIENT OCCUPATIONAL THERAPY NEURO TREATMENT NOTE DISCHARGE NOTE  Patient Name: Joshua Compton. MRN: [id number] DOB:01/13/1941, 83 y.o., male Today's Date: 02/12/2024  OCCUPATIONAL THERAPY DISCHARGE SUMMARY  Visits from Start of Care: 22  Current functional level related to goals / functional outcomes: Pt has met 2 out of 4 goals: provided a comprehensive HEP and strength 5/5 overall. Pt partially met 1 out of 4 goals with achieving 30# improvement in grip strength.    Remaining deficits: Pt continues to have limited fine motor skills, however they are much improved from starting therapy.    Education / Equipment: Pt has been provided a comprehensive HEP.   Plan: Patient agrees to discharge as he wishes to continue working on skills at home.        END OF SESSION:  OT End of Session - 02/12/24 1136     Visit Number 22    Number of Visits 22    Date for OT Re-Evaluation 02/19/24    Authorization Type [employer], $20 copay    Authorization Time Period no auth required    Progress Note Due on Visit 20    OT Start Time 1055    OT Stop Time 1121    OT Time Calculation (min) 26 min    Activity Tolerance Patient tolerated treatment well    Behavior During Therapy WFL for tasks assessed/performed              Past Medical History:  Diagnosis Date   Arthritis    Cancer (HCC)    prostate ca   seed implants   GERD (gastroesophageal reflux disease)    Glaucoma    Hyperlipidemia    Hypertension    dr Modesto Charon     [place]   fm   Stroke [facility]) 07/2023   Past Surgical History:  Procedure Laterality Date   BACK SURGERY     x2   COLONOSCOPY     HERNIA REPAIR  11-06-2009   left inguinal repair    NECK SURGERY     ROTATOR CUFF REPAIR Left jan 2016   TOTAL HIP ARTHROPLASTY Right 04/20/2020   Procedure: RIGHT TOTAL HIP ARTHROPLASTY ANTERIOR APPROACH;  Surgeon: Tarry Kos, MD;  Location: [facility] OR;  Service: Orthopedics;  Laterality: Right;   TOTAL HIP  ARTHROPLASTY Left 01/07/2021   Procedure: LEFT TOTAL HIP ARTHROPLASTY ANTERIOR APPROACH;  Surgeon: Tarry Kos, MD;  Location: [facility] OR;  Service: Orthopedics;  Laterality: Left;   TRANSURETHRAL RESECTION OF PROSTATE     Patient Active Problem List   Diagnosis Date Noted   Venous insufficiency of both lower extremities 02/01/2024   Chronic renal impairment, stage 3b (HCC) 02/01/2024   Centrilobular emphysema (HCC) 12/22/2023   High risk medication use 09/18/2023   Vitamin D deficiency 09/18/2023   Supplemental oxygen dependent 09/18/2023   Acute HFrEF (heart failure with reduced ejection fraction) (HCC) 09/08/2023   PAF (paroxysmal atrial fibrillation) (HCC) 09/07/2023   Acute respiratory failure with hypoxia (HCC) 09/05/2023   Acute heart failure with preserved ejection fraction (HFpEF) (HCC) 09/05/2023   Lobar pneumonia (HCC) 09/05/2023   Cerebrovascular accident (CVA) due to embolism of left middle cerebral artery (HCC) 08/10/2023   Paresthesia of both feet 07/28/2023   Gastroesophageal reflux disease with esophagitis without hemorrhage 09/10/2021   Primary insomnia 09/10/2021   Vasculogenic erectile dysfunction 09/10/2021   Status post total replacement of left hip 01/07/2021   Primary osteoarthritis of left hip 01/06/2021   Leg cramps 12/11/2020   Thyromegaly  05/19/2017   Neoplasm of scalp 12/08/2013   Rhinitis 12/08/2013   Cough 12/08/2013   Hyperlipidemia    Cancer (HCC)    Arthritis    Severe stage glaucoma 03/26/2012   Nuclear sclerosis 03/26/2012   Primary open angle glaucoma 10/27/2011   Essential hypertension 02/04/2011   Degenerative disc disease 02/04/2011   Gastrointestinal bleed 02/04/2011   Anemia 02/04/2011   Prostate cancer (HCC) 02/04/2011   PCP: Dr. [name] REFERRING PROVIDER: Dr. [name]  ONSET DATE: 08/07/23  REFERRING DIAG: Z61.096,E45.4 (ICD-10-CM) - Weakness as late effect of cerebrovascular accident (CVA)   THERAPY DIAG:  Other lack  of coordination  Other symptoms and signs involving the nervous system  Rationale for Evaluation and Treatment: Rehabilitation  SUBJECTIVE:   SUBJECTIVE STATEMENT: S: "This is taking such a long time to make progress."  PERTINENT HISTORY: Pt is an 83 y/o male who reports he had a stroke on September 19th. From [facility]. Left sided CVA impacting the R side.   PRECAUTIONS: Fall  WEIGHT BEARING RESTRICTIONS: No  PAIN:  Are you having pain? No  FALLS: Has patient fallen in last 6 months? No  PLOF: Independent  PATIENT GOALS: To get stronger and resume independence in ADLs.   OBJECTIVE:  Note: Objective measures were completed at Evaluation unless otherwise noted.  HAND DOMINANCE: Right  ADLs: Overall ADLs: Pt reports difficulty with gripping items, pt is unable to use a toothbrush or razor, is using LUE for ADLs. Pt is unable to tie his shoes, has not tried to hold a pencil. Using left hand to feed himself.  Pt enjoys golfing, fishing, and yardwork.    FUNCTIONAL OUTCOME MEASURES: FOTO: 78/100 01/13/24: 88.71/100  UPPER EXTREMITY ROM:      A/ROM of BUE is [facility]  UPPER EXTREMITY MMT:     MMT Right eval Right 12/07/23 Right 01/13/24 Right 02/12/24  Shoulder flexion 5/5 5/5 5/5 5/5  Shoulder abduction 4/5 (same as LUE) 4+/5 5/5 5/5  Shoulder internal rotation 5/5 5/5 5/5 5/5  Shoulder external rotation 5/5 5/5 5/5 5/5  Elbow flexion 5/5 5/5 5/5 5/5  Elbow extension 5/5 5/5 5/5 5/5  Wrist flexion 4/5 5/5 5/5 5/5  Wrist extension 3+/5 4/5 4+/5 5/5  Wrist ulnar deviation 3+/5 5/5 5/5 5/5  Wrist radial deviation 3+/5 4+/5 4+/5 5/5  Wrist pronation 5/5 5/5 5/5 5/5  Wrist supination 5/5 5/5 5/5 5/5  (Blank rows = not tested)  HAND FUNCTION: Grip strength: Right: 22 lbs; Left: 110 lbs, Lateral pinch: Right: 4 lbs, Left: 26 lbs, and 3 point pinch: Right: unable lbs, Left: 17 lbs 12/07/23: Grip strength: Right: 41 lbs; Lateral pinch: Right: 8 lbs, and 3 point pinch:  Right: 6 lbs 01/13/24: Grip strength: Right: 46 lbs; Lateral pinch: Right: 9 lbs, and 3 point pinch: Right: 6 lbs 02/12/24: Grip strength: Right: 50 lbs; Lateral pinch: Right: 10 lbs, and 3 point pinch: Right: 9 lbs  COORDINATION: 9 Hole Peg test: Right: 1'48" with set-up sec; Left: 29.94 sec 12/07/23: 9 Hole Peg test: Right: 7'44" sec - on no slip mat 01/13/24: 9 Hole Peg test: Right: 4'55" sec - on no slip mat 02/12/24: 9 Hole Peg test: Right: 4'05" sec - on no slip mat  EDEMA: mild edema in hand  TODAY'S TREATMENT:  DATE:   02/12/24 -Digit ROM: composite flexion, abduction, finger taps, opposition, x12 -Digit Isolation: flicking cube D1/D2 x10 (min difficulty - short flicks) flicking cube D1/D3 x10 (all fingers move - mod difficulty, max difficulty trying to keep D4 and D5 under/flexed), pointing with each digit  (mod difficulty, OT holding other digits down for isolation) -9 hole peg test -Wrist ROM: flexion, extension, ulnar/radial deviation, supination/pronation, x12  02/08/24 -Digit ROM: composite flexion, abduction, finger taps, opposition, x12 -Digit Isolation: flicking cube D1/D2 x10 (min difficulty - short flicks) flicking cube D1/D3 x10 (all fingers move - mod difficulty, max difficulty trying to keep D4 and D5 under/flexed), pointing with each digit  (mod difficulty, OT holding other digits down for isolation) -Sponges: 7, 7 -Theraputty: 10 beads placed in red putty, pt flattening, pinching, and pulling to find all beads - working on pinching to pull each bead out -Coins: sliding to edge of table and picking up 10 pennies, 3 nickels, and 2 dimes, placing in piggy bank slot (light min difficulty) - attempted to pick up penny and nickel straight from table - unable to pick up from table despite multiple attempts  02/04/24 -Theraputty: roll into a ball, squeeze  x10, flatten into a pancake, roll into a log, tripod pinch x10, lateral pinch x10, roll into a ball -pinch tree: red, green, blue resistance clips, x7 each, tripod up, lateral down -Tiny Peg board pattern: max difficulty, OT holding small part of pegs and pt pinching from OT's hand to place with mod difficulty placing pegs and picking them back up  02/01/24 -Building a table -Max difficulty with problem solving and perception - requiring max cuing to follow the directions put the pieces in proper order.  -Mod difficulty physically putting the pieces together and screwing the nuts on the bolts using BUE. Needing max cuing to use RUE and mod assist periodically to hold pieces in place -Min difficulty unscrewing all pieces using mainly RUE   PATIENT EDUCATION: Education details: HEP Person educated: Patient Education method: Programmer, multimedia, Demonstration, and Handouts Education comprehension: verbalized understanding and returned demonstration  HOME EXERCISE PROGRAM: Eval: yellow theraputty-grip and pinch strengthening 12/17: Wrist and digit ROM 12/27: pinch activities  1/24: Digit Isolation 1/31: Get "chip clips" and work on tip to tip pinch 2/26: Bait fish hooks 3/10: restart finger taps and opposition 3/24: Coins at home   GOALS: Goals reviewed with patient? Yes  SHORT TERM GOALS: Target date: 11/27/23  Pt will be provided with and educated on HEP to improve mobility and use of RUE required for ADL completion.   Goal status: MET  2.  Pt will increase RUE strength to 4+/5 to improve ability to perform yardwork and fishing activities using RUE as dominant.   Goal status: MET  3.  Pt will increase right grip strength by 30+ pounds and pinch strength by 10+ pounds to improve ability to grip and utilize tools such as a toothbrush or razor with the right hand.   Goal status: PARTIALLY MET  4.  Pt will improve RUE coordination required for tying shoes and operate fishing gear by  completing 9 hole peg test in under 1'.   Goal status: NOT MET    ASSESSMENT:  CLINICAL IMPRESSION: Pt completed reassessment this session for discharge. He is continuing to demonstrate good improvement throughout each session, with biggest deficit remaining his fine motor skills. Pt's RUE strength is 5/5, with grip and pinch improving to goals set. Fine motor is also improving, however requiring more  time. Pt requesting a break from therapy at this time to work on all skills and HEP at home, as well as to work on all functional skills at home. Pt will be discharged from OT at this time.  PERFORMANCE DEFICITS: in functional skills including ADLs, IADLs, coordination, dexterity, edema, tone, ROM, strength, and UE functional use   PLAN:  OT FREQUENCY: 2x/week  OT DURATION: 4 weeks  PLANNED INTERVENTIONS: 97168 OT Re-evaluation, 97535 self care/ADL training, 78295 therapeutic exercise, 97530 therapeutic activity, 97112 neuromuscular re-education, 97140 manual therapy, 97018 paraffin, 62130 electrical stimulation (manual), patient/family education, and DME and/or AE instructions  RECOMMENDED OTHER SERVICES: PT evaluation  CONSULTED AND AGREED WITH PLAN OF CARE: Patient  PLAN FOR NEXT SESSION:  Reynolds Bowl, OTR/L [phone]  02/12/2024, 11:37 AM

## 2024-02-16 ENCOUNTER — Other Ambulatory Visit: Payer: Self-pay | Admitting: Family Medicine

## 2024-02-16 ENCOUNTER — Ambulatory Visit: Admitting: Family Medicine

## 2024-02-18 ENCOUNTER — Encounter: Payer: Self-pay | Admitting: Family Medicine

## 2024-02-18 ENCOUNTER — Ambulatory Visit: Payer: PPO | Admitting: Family Medicine

## 2024-02-18 VITALS — BP 137/52 | HR 77 | Temp 98.3°F | Ht 71.0 in | Wt 198.0 lb

## 2024-02-18 DIAGNOSIS — L98491 Non-pressure chronic ulcer of skin of other sites limited to breakdown of skin: Secondary | ICD-10-CM | POA: Diagnosis not present

## 2024-02-18 DIAGNOSIS — I10 Essential (primary) hypertension: Secondary | ICD-10-CM | POA: Diagnosis not present

## 2024-02-18 DIAGNOSIS — R059 Cough, unspecified: Secondary | ICD-10-CM

## 2024-02-18 DIAGNOSIS — N1832 Chronic kidney disease, stage 3b: Secondary | ICD-10-CM | POA: Diagnosis not present

## 2024-02-18 MED ORDER — ATORVASTATIN CALCIUM 40 MG PO TABS: 40.0000 mg | ORAL_TABLET | Freq: Every day | ORAL | 1 refills | Status: DC

## 2024-02-18 NOTE — Progress Notes (Signed)
 Subjective:  Patient ID: Joshua Compton., male    DOB: Jun 29, 1941  Age: 83 y.o. MRN: [id number]  CC: Ulcer (Ulcer on leg got better but seems to be flared up again. Some drainage.)   HPI [place]. presents for recurrent ulceration of left leg. Since the unna boot was removed the patient has had mild edema. NKI, wound reopened several days ago. Currently dressing it with neosporin and guaze.    in for follow-up of elevated cholesterol. Doing well without complaints on current medication. Denies side effects of statin including myalgia and arthralgia and nausea. Currently no chest pain, shortness of breath or other cardiovascular related symptoms noted.      02/01/2024    3:18 PM 10/19/2023    1:08 PM 07/28/2023    8:57 AM  Depression screen PHQ 2/9  Decreased Interest 0 0 0  Down, Depressed, Hopeless 0 0 0  PHQ - 2 Score 0 0 0  Altered sleeping 0    Tired, decreased energy 0    Change in appetite 0    Feeling bad or failure about yourself  0    Trouble concentrating 0    Moving slowly or fidgety/restless 0    Suicidal thoughts 0    PHQ-9 Score 0    Difficult doing work/chores Not difficult at all      History Jayan has a past medical history of Arthritis, Cancer (HCC), GERD (gastroesophageal reflux disease), Glaucoma, Hyperlipidemia, Hypertension, and Stroke (HCC) (07/2023).   He has a past surgical history that includes Back surgery; Neck surgery; Transurethral resection of prostate; Hernia repair (11-06-2009); Rotator cuff repair (Left, jan 2016); Colonoscopy; Total hip arthroplasty (Right, 04/20/2020); and Total hip arthroplasty (Left, 01/07/2021).   His family history includes Cancer in his brother, brother, and mother; Heart disease in his father.He reports that he quit smoking about 18 years ago. His smoking use included cigarettes. He started smoking about 48 years ago. He has a 15 pack-year smoking history. He has never used smokeless tobacco. He reports  that he does not drink alcohol and does not use drugs.    ROS Review of Systems  Constitutional:  Negative for fever.  Respiratory:  Negative for shortness of breath.   Cardiovascular:  Negative for chest pain.  Musculoskeletal:  Negative for arthralgias.  Skin:  Negative for rash.    Objective:  BP (!) 137/52   Pulse 77   Temp 98.3 F (36.8 C)   Ht 5\' 11"  (1.803 m)   Wt 198 lb (89.8 kg)   SpO2 92%   BMI 27.62 kg/m   BP Readings from Last 3 Encounters:  02/18/24 (!) 137/52  02/01/24 (!) 150/67  01/25/24 (!) 158/57    Wt Readings from Last 3 Encounters:  02/18/24 198 lb (89.8 kg)  02/01/24 201 lb (91.2 kg)  01/25/24 203 lb (92.1 kg)     Physical Exam Vitals reviewed.  Constitutional:      Appearance: He is well-developed.  HENT:     Head: Normocephalic and atraumatic.     Right Ear: External ear normal.     Left Ear: External ear normal.     Mouth/Throat:     Pharynx: No oropharyngeal exudate or posterior oropharyngeal erythema.  Eyes:     Pupils: Pupils are equal, round, and reactive to light.  Cardiovascular:     Rate and Rhythm: Normal rate and regular rhythm.     Heart sounds: No murmur heard. Pulmonary:     Effort: No  respiratory distress.     Breath sounds: Normal breath sounds.  Musculoskeletal:     Cervical back: Normal range of motion and neck supple.  Skin:    Capillary Refill: Capillary refill takes 2 to 3 seconds.     Findings: Lesion (Grade 1-2 lesions at the left mid shin.3 lesions than are through the skin barely into subq tissue.) present.  Neurological:     Mental Status: He is alert and oriented to person, place, and time.      Assessment & Plan:  Essential hypertension -     BMP8+EGFR  Chronic renal impairment, stage 3b (HCC) -     BMP8+EGFR  Skin ulcer, limited to breakdown of skin (HCC)  Other orders -     Atorvastatin Calcium; Take 1 tablet (40 mg total) by mouth daily.  Dispense: 90 tablet; Refill: 1     Follow-up:  Return in about 1 week (around 02/25/2024).  [name], M.D.

## 2024-02-19 LAB — BMP8+EGFR
BUN/Creatinine Ratio: 11 (ref 10–24)
BUN: 20 mg/dL (ref 8–27)
CO2: 24 mmol/L (ref 20–29)
Calcium: 9.7 mg/dL (ref 8.6–10.2)
Chloride: 103 mmol/L (ref 96–106)
Creatinine, Ser: 1.79 mg/dL — ABNORMAL HIGH (ref 0.76–1.27)
Glucose: 124 mg/dL — ABNORMAL HIGH (ref 70–99)
Potassium: 3.9 mmol/L (ref 3.5–5.2)
Sodium: 143 mmol/L (ref 134–144)
eGFR: 37 mL/min/{1.73_m2} — ABNORMAL LOW (ref >59)

## 2024-02-24 ENCOUNTER — Ambulatory Visit (INDEPENDENT_AMBULATORY_CARE_PROVIDER_SITE_OTHER): Admitting: Family Medicine

## 2024-02-24 ENCOUNTER — Encounter: Payer: Self-pay | Admitting: Family Medicine

## 2024-02-24 VITALS — BP 120/58 | HR 75 | Temp 98.2°F | Ht 71.0 in | Wt 198.0 lb

## 2024-02-24 DIAGNOSIS — N1832 Chronic kidney disease, stage 3b: Secondary | ICD-10-CM | POA: Diagnosis not present

## 2024-02-24 DIAGNOSIS — R051 Acute cough: Secondary | ICD-10-CM | POA: Diagnosis not present

## 2024-02-24 DIAGNOSIS — I480 Paroxysmal atrial fibrillation: Secondary | ICD-10-CM | POA: Diagnosis not present

## 2024-02-24 DIAGNOSIS — I872 Venous insufficiency (chronic) (peripheral): Secondary | ICD-10-CM | POA: Diagnosis not present

## 2024-02-24 DIAGNOSIS — J441 Chronic obstructive pulmonary disease with (acute) exacerbation: Secondary | ICD-10-CM

## 2024-02-24 DIAGNOSIS — L98491 Non-pressure chronic ulcer of skin of other sites limited to breakdown of skin: Secondary | ICD-10-CM

## 2024-02-24 MED ORDER — HYDROCODONE BIT-HOMATROP MBR 5-1.5 MG/5ML PO SOLN: 5.0000 mL | Freq: Four times a day (QID) | ORAL | 0 refills | Status: AC | PRN

## 2024-02-24 MED ORDER — AMOXICILLIN-POT CLAVULANATE 875-125 MG PO TABS
1.0000 | ORAL_TABLET | Freq: Two times a day (BID) | ORAL | 0 refills | Status: DC
Start: 2024-02-24 — End: 2024-03-31

## 2024-02-24 NOTE — Progress Notes (Signed)
 Subjective:  Patient ID: Joshua Najjar., male    DOB: 02-16-1941  Age: 83 y.o. MRN: [id number]  CC: Leg check   HPI Joshua Compton. presents for change of Unna boot dressing.  His left leg has had a stasis ulcer that has been wrapped this last week with Unna boot.  He does not have any complaints of pain swelling bleeding since he was here last week.  In today for evaluation and potential change of dressing.  The swelling seems well-controlled currently he states.  He also has renal insufficiency that contributes to swelling periodically.  During this week the patient has also developed a congested cough.  His daughter who is with him says it sounds like bronchitis.  He is coughing up phlegm.  There has been no shortness of breath or fever.   Patient in for follow-up of atrial fibrillation. Patient denies any recent bouts of chest pain or palpitations. Additionally, patient is taking anticoagulants. Patient denies any recent excessive bleeding episodes   History Joshua Compton has a past medical history of Acute heart failure with preserved ejection fraction (HFpEF) (HCC) (09/05/2023), Acute HFrEF (heart failure with reduced ejection fraction) (HCC) (09/08/2023), Acute respiratory failure with hypoxia (HCC) (09/05/2023), Arthritis, Cancer (HCC), GERD (gastroesophageal reflux disease), Glaucoma, Hyperlipidemia, Hypertension, and Stroke (HCC) (07/2023).   He has a past surgical history that includes Back surgery; Neck surgery; Transurethral resection of prostate; Hernia repair (11-06-2009); Rotator cuff repair (Left, jan 2016); Colonoscopy; Total hip arthroplasty (Right, 04/20/2020); and Total hip arthroplasty (Left, 01/07/2021).   His family history includes Cancer in his brother, brother, and mother; Heart disease in his father.He reports that he quit smoking about 18 years ago. His smoking use included cigarettes. He started smoking about 48 years ago. He has a 15 pack-year smoking  history. He has never used smokeless tobacco. He reports that he does not drink alcohol and does not use drugs.    ROS Review of Systems  Objective:  BP (!) 120/58   Pulse 75   Temp 98.2 F (36.8 C)   Ht 5\' 11"  (1.803 m)   Wt 198 lb (89.8 kg)   SpO2 (!) 88%   BMI 27.62 kg/m   BP Readings from Last 3 Encounters:  02/24/24 (!) 120/58  02/18/24 (!) 137/52  02/01/24 (!) 150/67    Wt Readings from Last 3 Encounters:  02/24/24 198 lb (89.8 kg)  02/18/24 198 lb (89.8 kg)  02/01/24 201 lb (91.2 kg)     Physical Exam Constitutional:      Appearance: He is well-developed.  HENT:     Head: Normocephalic and atraumatic.     Right Ear: Tympanic membrane and external ear normal. No decreased hearing noted.     Left Ear: Tympanic membrane and external ear normal. No decreased hearing noted.     Nose: Mucosal edema present.     Right Sinus: No frontal sinus tenderness.     Left Sinus: No frontal sinus tenderness.     Mouth/Throat:     Pharynx: No oropharyngeal exudate or posterior oropharyngeal erythema.  Neck:     Meningeal: Brudzinski's sign absent.  Cardiovascular:     Heart sounds: Normal heart sounds.  Pulmonary:     Effort: No respiratory distress.     Breath sounds: Rhonchi present.  Musculoskeletal:     Cervical back: Normal range of motion.  Lymphadenopathy:     Head:     Right side of head: No preauricular adenopathy.  Left side of head: No preauricular adenopathy.     Cervical:     Right cervical: No superficial cervical adenopathy.    Left cervical: No superficial cervical adenopathy.  Skin:    Comments: The left lower extremity has denuded epithelium in the mid shin area in 2-3 spots.  There remains to open as 1 3 cm the other 5 cm just through the skin.  Grade 1-2 decubitus related to stasis dermatitis and edema.       Assessment & Plan:  Chronic renal impairment, stage 3b (HCC)  Acute cough  PAF (paroxysmal atrial fibrillation) (HCC)  Venous  insufficiency of both lower extremities  Skin ulcer, limited to breakdown of skin (HCC) -     Apply unna boot  Acute exacerbation of chronic obstructive pulmonary disease (COPD) (HCC) -     HYDROcodone Bit-Homatrop MBr; Take 5 mLs by mouth every 6 (six) hours as needed for up to 5 days for cough.  Dispense: 100 mL; Refill: 0 -     Amoxicillin-Pot Clavulanate; Take 1 tablet by mouth 2 (two) times daily. Take all of this medication  Dispense: 20 tablet; Refill: 0   Due to atrial fibrillation he is on anticoagulants we are looking very carefully at medications that are safe for him as a result of this plus his use of amiodarone and presence of renal insufficiency stage IIIb.  Of note is that his daughter who is with him today states he has not used the amiodarone for 2 months.  However he is seeing his cardiologist in the next day or 2.  Will allow them to address whether to resume or discontinue since he is on anticoagulation and rate control and is already been off of the medicine for 2 months.  Follow-up: Return in about 1 week (around 03/02/2024).  [name], M.D.

## 2024-02-25 ENCOUNTER — Ambulatory Visit: Admitting: Family Medicine

## 2024-02-25 ENCOUNTER — Ambulatory Visit: Payer: PPO | Attending: Cardiology | Admitting: Cardiology

## 2024-02-25 ENCOUNTER — Encounter: Payer: Self-pay | Admitting: Cardiology

## 2024-02-25 VITALS — BP 124/58 | HR 63 | Ht 72.0 in | Wt 200.0 lb

## 2024-02-25 DIAGNOSIS — I10 Essential (primary) hypertension: Secondary | ICD-10-CM | POA: Diagnosis not present

## 2024-02-25 DIAGNOSIS — I5022 Chronic systolic (congestive) heart failure: Secondary | ICD-10-CM

## 2024-02-25 DIAGNOSIS — I4891 Unspecified atrial fibrillation: Secondary | ICD-10-CM

## 2024-02-25 DIAGNOSIS — D6869 Other thrombophilia: Secondary | ICD-10-CM

## 2024-02-25 MED ORDER — AMIODARONE HCL 200 MG PO TABS: 200.0000 mg | ORAL_TABLET | Freq: Every day | ORAL | 3 refills | Status: DC

## 2024-02-25 MED ORDER — APIXABAN 2.5 MG PO TABS: 2.5000 mg | ORAL_TABLET | Freq: Two times a day (BID) | ORAL | 11 refills | Status: DC

## 2024-02-25 NOTE — Patient Instructions (Signed)
 Medication Instructions:  Your physician has recommended you make the following change in your medication:   -Decrease Eliquis to 2.5 mg twice daily.     *If you need a refill on your cardiac medications before your next appointment, please call your pharmacy*  Lab Work: None If you have labs (blood work) drawn today and your tests are completely normal, you will receive your results only by: MyChart Message (if you have MyChart) OR A paper copy in the mail If you have any lab test that is abnormal or we need to change your treatment, we will call you to review the results.  Testing/Procedures: None  Follow-Up: At [facility], you and your health needs are our priority.  As part of our continuing mission to provide you with exceptional heart care, our providers are all part of one team.  This team includes your primary Cardiologist (physician) and Advanced Practice Providers or APPs (Physician Assistants and Nurse Practitioners) who all work together to provide you with the care you need, when you need it.  Your next appointment:   4 month(s)  Provider:   You may see Dina Rich, MD or one of the following Advanced Practice Providers on your designated Care Team:   Randall An, PA-C  Scotesia Elbe, [place] Jacolyn Reedy, [place]     We recommend signing up for the patient portal called "MyChart".  Sign up information is provided on this After Visit Summary.  MyChart is used to connect with patients for Virtual Visits (Telemedicine).  Patients are able to view lab/test results, encounter notes, upcoming appointments, etc.  Non-urgent messages can be sent to your provider as well.   To learn more about what you can do with MyChart, go to [email or web address].   Other Instructions

## 2024-02-25 NOTE — Progress Notes (Signed)
 Clinical Summary Mr. Nocito is a 83 y.o.male seen today for follow up of the following medical problems.    1.PAF - new diagnosis during 08/2023 admission - - patient with recent CVA, afib possible related  - started on oral amio to try to restore SR in setting of declining LVEF.   - he stopped taking amio few months ago, unclear what happened - no recent palpitations.  - recent decline in kidney function, based on his age he will need to lower eliquis to 2.5mg  bid     2. HFrEF - 07/2023 echo [facility]: LVE 55-60%, mild AI - 08/2023 echo this admit: LVEF 40-45% - drop in LVEF in setting of newly diagnosed afib, perhaps tachy mediated. Also with pneumonia this admission, possible stress induced CM. CT chest this admit did show severe LM and 3 vessel disease as well, risk for ischemic CM. - Would let him recover from recent stroke prior to considering an ischemic evaluation.        Jan 2025 echo: LVEF 45-50%, indet diastolic - no SOB/DOE, LE edema fairly well controlled.  - pcp had changed lasix to torsemide 60mg   3. History of CVA       Past Medical History:  Diagnosis Date   Acute heart failure with preserved ejection fraction (HFpEF) (HCC) 09/05/2023   Acute HFrEF (heart failure with reduced ejection fraction) (HCC) 09/08/2023   Acute respiratory failure with hypoxia (HCC) 09/05/2023   Arthritis    Cancer (HCC)    prostate ca   seed implants   GERD (gastroesophageal reflux disease)    Glaucoma    Hyperlipidemia    Hypertension    dr [place] Charon     [place]   fm   Stroke [facility]) 07/2023     Allergies  Allergen Reactions   Iodinated Contrast Media Anaphylaxis    Tongue swelled up when he had MRI in [facility]  Intubated for airway protection after oral and throat swelling   Multihance [Gadobenate] Swelling    Tongue swelled up when he had MRI in [facility]   Pravastatin Other (See Comments)    Muscle aches   Pravastatin Sodium     Muscle aches     Current  Outpatient Medications  Medication Sig Dispense Refill   acetaminophen (TYLENOL) 500 MG tablet Take 1,000 mg by mouth every 6 (six) hours as needed for mild pain (pain score 1-3).     amiodarone (PACERONE) 200 MG tablet Take 1 tablet (200 mg total) by mouth 2 (two) times daily. X 20 days, then once daily starting 09/28/23 60 tablet 1   amoxicillin-clavulanate (AUGMENTIN) 875-125 MG tablet Take 1 tablet by mouth 2 (two) times daily. Take all of this medication 20 tablet 0   apixaban (ELIQUIS) 5 MG TABS tablet Take 1 tablet (5 mg total) by mouth 2 (two) times daily. 60 tablet 3   atorvastatin (LIPITOR) 40 MG tablet Take 1 tablet (40 mg total) by mouth daily. 90 tablet 1   cholecalciferol (VITAMIN D3) 25 MCG (1000 UNIT) tablet TAKE 1 TABLET BY MOUTH EVERY DAY 90 tablet 1   dapagliflozin propanediol (FARXIGA) 10 MG TABS tablet Take 1 tablet (10 mg total) by mouth daily. 90 tablet 3   dorzolamide-timolol (COSOPT) 22.3-6.8 MG/ML ophthalmic solution Place 1 drop into both eyes daily.      fluticasone (FLONASE) 50 MCG/ACT nasal spray SPRAY 2 SPRAYS INTO EACH NOSTRIL EVERY DAY (Patient taking differently: Place 2 sprays into both nostrils daily as needed for  allergies.) 48 mL 2   Fluticasone-Umeclidin-Vilant (TRELEGY ELLIPTA) 100-62.5-25 MCG/ACT AEPB Inhale 1 puff into the lungs daily. 28 each 5   HYDROcodone bit-homatropine (HYCODAN) 5-1.5 MG/5ML syrup Take 5 mLs by mouth every 6 (six) hours as needed for up to 5 days for cough. 100 mL 0   metoprolol succinate (TOPROL-XL) 25 MG 24 hr tablet TAKE 0.5 TABLETS (12.5 MG TOTAL) BY MOUTH DAILY. FOR BLOOD PRESSURE CONTROL 45 tablet 1   pantoprazole (PROTONIX) 40 MG tablet Take 1 tablet (40 mg total) by mouth daily. For heartburn 90 tablet 3   potassium chloride SA (KLOR-CON M) 20 MEQ tablet Take 1 tablet (20 mEq total) by mouth daily. As a potassium supplement 30 tablet 3   rOPINIRole (REQUIP) 1 MG tablet Take 1 tablet (1 mg total) by mouth 3 (three) times daily.  Plus an extra tablet at bedtime to total four a day 360 tablet 3   sacubitril-valsartan (ENTRESTO) 24-26 MG Take 1 tablet by mouth 2 (two) times daily. 60 tablet 3   torsemide (DEMADEX) 20 MG tablet Take 3 tablets (60 mg total) by mouth daily.     No current facility-administered medications for this visit.     Past Surgical History:  Procedure Laterality Date   BACK SURGERY     x2   COLONOSCOPY     HERNIA REPAIR  11-06-2009   left inguinal repair    NECK SURGERY     ROTATOR CUFF REPAIR Left jan 2016   TOTAL HIP ARTHROPLASTY Right 04/20/2020   Procedure: RIGHT TOTAL HIP ARTHROPLASTY ANTERIOR APPROACH;  Surgeon: Tarry Kos, MD;  Location: [facility] OR;  Service: Orthopedics;  Laterality: Right;   TOTAL HIP ARTHROPLASTY Left 01/07/2021   Procedure: LEFT TOTAL HIP ARTHROPLASTY ANTERIOR APPROACH;  Surgeon: Tarry Kos, MD;  Location: [facility] OR;  Service: Orthopedics;  Laterality: Left;   TRANSURETHRAL RESECTION OF PROSTATE       Allergies  Allergen Reactions   Iodinated Contrast Media Anaphylaxis    Tongue swelled up when he had MRI in [facility]  Intubated for airway protection after oral and throat swelling   Multihance [Gadobenate] Swelling    Tongue swelled up when he had MRI in [facility]   Pravastatin Other (See Comments)    Muscle aches   Pravastatin Sodium     Muscle aches      Family History  Problem Relation Age of Onset   Cancer Mother        breast   Heart disease Father    Cancer Brother    Cancer Brother      Social History Mr. Hopkin reports that he quit smoking about 18 years ago. His smoking use included cigarettes. He started smoking about 48 years ago. He has a 15 pack-year smoking history. He has never used smokeless tobacco. Mr. Menzer reports no history of alcohol use.     Physical Examination Today's Vitals   02/25/24 0930  BP: (!) 124/58  Pulse: 63  SpO2: 95%  Weight: 200 lb (90.7 kg)  Height: 6' (1.829 m)   Body mass index is 27.12  kg/m.  Gen: resting comfortably, no acute distress HEENT: no scleral icterus, pupils equal round and reactive, no palptable cervical adenopathy,  CV: RRR, no mrg, no jvd Resp: Clear to auscultation bilaterally GI: abdomen is soft, non-tender, non-distended, normal bowel sounds, no hepatosplenomegaly MSK: extremities are warm, no edema.  Skin: warm, no rash Neuro:  no focal deficits Psych: appropriate affect   Diagnostic Studies  Jan 2025 echo 1. Left ventricular ejection fraction, by estimation, is 45 to 50%. The  left ventricle has mildly decreased function. The left ventricle  demonstrates global hypokinesis. Left ventricular diastolic parameters are  indeterminate.   2. Right ventricular systolic function is normal. The right ventricular  size is normal. Tricuspid regurgitation signal is inadequate for assessing  PA pressure.   3. Left atrial size was moderately dilated.   4. The mitral valve is normal in structure. Trivial mitral valve  regurgitation. No evidence of mitral stenosis.   5. The aortic valve has an indeterminant number of cusps. There is mild  calcification of the aortic valve. There is mild thickening of the aortic  valve. Aortic valve regurgitation is mild. No aortic stenosis is present.   6. The inferior vena cava is dilated in size with >50% respiratory  variability, suggesting right atrial pressure of 8 mmHg.    Assessment and Plan   1.PAF/acquired thrombophilia - EKG today shows SR in the 60s - restart his amiodarone 200mg  daily - over 83 yo and based on decline in renal function he now qualifies for eliquis 2.5mg  bid, will lower dose.    2.Chronic HFmrEF - euvolemic today - continue current meds, regimen limited by renal dysfunction    3. HTN -at goal, continue current meds   F/u 4 months     Antoine Poche, M.D.

## 2024-03-03 ENCOUNTER — Telehealth: Payer: Self-pay | Admitting: Family Medicine

## 2024-03-03 ENCOUNTER — Ambulatory Visit (INDEPENDENT_AMBULATORY_CARE_PROVIDER_SITE_OTHER): Admitting: Family Medicine

## 2024-03-03 ENCOUNTER — Encounter: Payer: Self-pay | Admitting: Family Medicine

## 2024-03-03 VITALS — BP 137/63 | HR 62 | Temp 97.1°F | Ht 73.0 in | Wt 200.6 lb

## 2024-03-03 DIAGNOSIS — I872 Venous insufficiency (chronic) (peripheral): Secondary | ICD-10-CM

## 2024-03-03 DIAGNOSIS — L98491 Non-pressure chronic ulcer of skin of other sites limited to breakdown of skin: Secondary | ICD-10-CM | POA: Diagnosis not present

## 2024-03-06 ENCOUNTER — Encounter: Payer: Self-pay | Admitting: Family Medicine

## 2024-03-06 NOTE — Telephone Encounter (Signed)
 Duplicate

## 2024-03-06 NOTE — Telephone Encounter (Signed)
 duplicate

## 2024-03-06 NOTE — Progress Notes (Signed)
 Subjective:  Patient ID: Joshua Mulberry., male    DOB: [birth year]  Age: 83 y.o. MRN: [id number]  CC: Wound Check (Looks better)   HPI Joshua Compton. presents for removal of unna boot Feeling much better. No edema or pain.      02/01/2024    3:18 PM 10/19/2023    1:08 PM 07/28/2023    8:57 AM  Depression screen PHQ 2/9  Decreased Interest 0 0 0  Down, Depressed, Hopeless 0 0 0  PHQ - 2 Score 0 0 0  Altered sleeping 0    Tired, decreased energy 0    Change in appetite 0    Feeling bad or failure about yourself  0    Trouble concentrating 0    Moving slowly or fidgety/restless 0    Suicidal thoughts 0    PHQ-9 Score 0    Difficult doing work/chores Not difficult at all      History Joshua Compton has a past medical history of Acute heart failure with preserved ejection fraction (HFpEF) (HCC) (09/05/2023), Acute HFrEF (heart failure with reduced ejection fraction) (HCC) (09/08/2023), Acute respiratory failure with hypoxia (HCC) (09/05/2023), Arthritis, Cancer (HCC), GERD (gastroesophageal reflux disease), Glaucoma, Hyperlipidemia, Hypertension, and Stroke (HCC) (07/2023).   He has a past surgical history that includes Back surgery; Neck surgery; Transurethral resection of prostate; Hernia repair (11-06-2009); Rotator cuff repair (Left, jan 2016); Colonoscopy; Total hip arthroplasty (Right, 04/20/2020); and Total hip arthroplasty (Left, 01/07/2021).   His family history includes Cancer in his brother, brother, and mother; Heart disease in his father.He reports that he quit smoking about 18 years ago. His smoking use included cigarettes. He started smoking about 48 years ago. He has a 15 pack-year smoking history. He has never used smokeless tobacco. He reports that he does not drink alcohol and does not use drugs.    ROS Review of Systems  Objective:  BP 137/63   Pulse 62   Temp (!) 97.1 F (36.2 C)   Ht 6\' 1"  (1.854 m)   Wt 200 lb 9.6 oz (91 kg)   SpO2 92%   BMI 26.47  kg/m   BP Readings from Last 3 Encounters:  03/03/24 137/63  02/25/24 (!) 124/58  02/24/24 (!) 120/58    Wt Readings from Last 3 Encounters:  03/03/24 200 lb 9.6 oz (91 kg)  02/25/24 200 lb (90.7 kg)  02/24/24 198 lb (89.8 kg)     Physical Exam Vitals reviewed.  Constitutional:      Appearance: He is well-developed.  HENT:     Head: Normocephalic and atraumatic.     Right Ear: External ear normal.     Left Ear: External ear normal.     Mouth/Throat:     Pharynx: No oropharyngeal exudate or posterior oropharyngeal erythema.  Eyes:     Pupils: Pupils are equal, round, and reactive to light.  Cardiovascular:     Rate and Rhythm: Normal rate and regular rhythm.     Heart sounds: No murmur heard. Pulmonary:     Effort: No respiratory distress.     Breath sounds: Normal breath sounds.  Musculoskeletal:     Cervical back: Normal range of motion and neck supple.  Skin:    Findings: Lesion (The ulceration has cleared with exxception of  1 cm area near the inferior margin.) present.  Neurological:     Mental Status: He is alert and oriented to person, place, and time.      Assessment & Plan:  Venous insufficiency of both lower extremities  Skin ulcer, limited to breakdown of skin (HCC)     Follow-up: No follow-ups on file.  Roise Cleaver, M.D.

## 2024-03-19 ENCOUNTER — Other Ambulatory Visit: Payer: Self-pay | Admitting: Family Medicine

## 2024-03-26 ENCOUNTER — Emergency Department (HOSPITAL_COMMUNITY)

## 2024-03-26 ENCOUNTER — Emergency Department (HOSPITAL_COMMUNITY)
Admission: EM | Admit: 2024-03-26 | Discharge: 2024-03-26 | Disposition: A | Discharge status: Home / Self Care | Attending: Emergency Medicine | Admitting: Emergency Medicine

## 2024-03-26 DIAGNOSIS — Z7951 Long term (current) use of inhaled steroids: Secondary | ICD-10-CM | POA: Insufficient documentation

## 2024-03-26 DIAGNOSIS — R079 Chest pain, unspecified: Secondary | ICD-10-CM | POA: Diagnosis not present

## 2024-03-26 DIAGNOSIS — R0781 Pleurodynia: Secondary | ICD-10-CM | POA: Insufficient documentation

## 2024-03-26 DIAGNOSIS — R109 Unspecified abdominal pain: Secondary | ICD-10-CM | POA: Diagnosis not present

## 2024-03-26 DIAGNOSIS — E049 Nontoxic goiter, unspecified: Secondary | ICD-10-CM | POA: Diagnosis not present

## 2024-03-26 DIAGNOSIS — R091 Pleurisy: Secondary | ICD-10-CM | POA: Diagnosis not present

## 2024-03-26 DIAGNOSIS — Z7901 Long term (current) use of anticoagulants: Secondary | ICD-10-CM | POA: Insufficient documentation

## 2024-03-26 DIAGNOSIS — R0602 Shortness of breath: Secondary | ICD-10-CM | POA: Diagnosis not present

## 2024-03-26 DIAGNOSIS — J449 Chronic obstructive pulmonary disease, unspecified: Secondary | ICD-10-CM | POA: Diagnosis not present

## 2024-03-26 DIAGNOSIS — J439 Emphysema, unspecified: Secondary | ICD-10-CM | POA: Diagnosis not present

## 2024-03-26 LAB — CBC WITH DIFFERENTIAL/PLATELET
Abs Immature Granulocytes: 0.02 10*3/uL (ref 0.00–0.07)
Basophils Absolute: 0.1 10*3/uL (ref 0.0–0.1)
Basophils Relative: 1 %
Eosinophils Absolute: 0.2 10*3/uL (ref 0.0–0.5)
Eosinophils Relative: 3 %
HCT: 41 % (ref 39.0–52.0)
Hemoglobin: 13.1 g/dL (ref 13.0–17.0)
Immature Granulocytes: 0 %
Lymphocytes Relative: 26 %
Lymphs Abs: 1.5 10*3/uL (ref 0.7–4.0)
MCH: 30.8 pg (ref 26.0–34.0)
MCHC: 32 g/dL (ref 30.0–36.0)
MCV: 96.5 fL (ref 80.0–100.0)
Monocytes Absolute: 0.5 10*3/uL (ref 0.1–1.0)
Monocytes Relative: 9 %
Neutro Abs: 3.6 10*3/uL (ref 1.7–7.7)
Neutrophils Relative %: 61 %
Platelets: 202 10*3/uL (ref 150–400)
RBC: 4.25 MIL/uL (ref 4.22–5.81)
RDW: 14.6 % (ref 11.5–15.5)
WBC: 5.9 10*3/uL (ref 4.0–10.5)
nRBC: 0 % (ref 0.0–0.2)

## 2024-03-26 LAB — COMPREHENSIVE METABOLIC PANEL WITH GFR
ALT: 18 U/L (ref 0–44)
AST: 18 U/L (ref 15–41)
Albumin: 3.5 g/dL (ref 3.5–5.0)
Alkaline Phosphatase: 108 U/L (ref 38–126)
Anion gap: 10 (ref 5–15)
BUN: 24 mg/dL — ABNORMAL HIGH (ref 8–23)
CO2: 27 mmol/L (ref 22–32)
Calcium: 9.2 mg/dL (ref 8.9–10.3)
Chloride: 102 mmol/L (ref 98–111)
Creatinine, Ser: 1.92 mg/dL — ABNORMAL HIGH (ref 0.61–1.24)
GFR, Estimated: 34 mL/min — ABNORMAL LOW (ref >60)
Glucose, Bld: 99 mg/dL (ref 70–99)
Potassium: 3.9 mmol/L (ref 3.5–5.1)
Sodium: 139 mmol/L (ref 135–145)
Total Bilirubin: 0.7 mg/dL (ref 0.0–1.2)
Total Protein: 6.8 g/dL (ref 6.5–8.1)

## 2024-03-26 LAB — TROPONIN I (HIGH SENSITIVITY)
Troponin I (High Sensitivity): 6 ng/L (ref <18)
Troponin I (High Sensitivity): 6 ng/L (ref <18)

## 2024-03-26 LAB — D-DIMER, QUANTITATIVE: D-Dimer, Quant: 0.44 ug{FEU}/mL (ref 0.00–0.50)

## 2024-03-26 LAB — LIPASE, BLOOD: Lipase: 28 U/L (ref 11–51)

## 2024-03-26 MED ORDER — PANTOPRAZOLE SODIUM 40 MG IV SOLR
40.0000 mg | Freq: Once | INTRAVENOUS | Status: AC
  Administered 2024-03-26: 40 mg via INTRAVENOUS
  Filled 2024-03-26: qty 10

## 2024-03-26 MED ORDER — TRAMADOL HCL 50 MG PO TABS: ORAL_TABLET | ORAL | 0 refills | Status: DC

## 2024-03-26 NOTE — ED Notes (Signed)
 Discharge teaching given to patient and daughter bedside, no further questions at this time.

## 2024-03-26 NOTE — ED Triage Notes (Signed)
 Patient arrived from home with complaint of mid abdominal pain that has been ongoing for a week dull pain and when breathing deep he also has some chest pain that he describes as dull.

## 2024-03-26 NOTE — Discharge Instructions (Signed)
Follow-up with your family doctor this week for recheck 

## 2024-03-26 NOTE — ED Provider Notes (Signed)
 [place] EMERGENCY DEPARTMENT AT [facility] Provider Note   CSN: [id number] Arrival date & time: 03/26/24  5638     History  Chief Complaint  Patient presents with   Abdominal Pain    Joshua Compton. is a 83 y.o. male.  Patient complains of pain with inspiration for a number of weeks.  He has a history of COPD  The history is provided by the patient and medical records.  Chest Pain Pain location:  R chest Pain quality: aching   Pain radiates to:  Does not radiate Pain severity:  Mild Onset quality:  Sudden Timing:  Intermittent Progression:  Improving Chronicity:  New Context: breathing   Relieved by:  Nothing Worsened by:  Nothing Associated symptoms: no abdominal pain, no back pain, no cough, no fatigue and no headache        Home Medications Prior to Admission medications   Medication Sig Start Date End Date Taking? Authorizing Provider  traMADol  (ULTRAM ) 50 MG tablet Take 1 every 6 hours for pain not relieved by Tylenol  alone 03/26/24  Yes Cheyenne Cotta, MD  acetaminophen  (TYLENOL ) 500 MG tablet Take 1,000 mg by mouth every 6 (six) hours as needed for mild pain (pain score 1-3).    [provider]  amiodarone  (PACERONE ) 200 MG tablet Take 1 tablet (200 mg total) by mouth daily. 02/25/24   Laurann Pollock, MD  amoxicillin -clavulanate (AUGMENTIN ) 875-125 MG tablet Take 1 tablet by mouth 2 (two) times daily. Take all of this medication 02/24/24   Roise Cleaver, MD  apixaban  (ELIQUIS ) 2.5 MG TABS tablet Take 1 tablet (2.5 mg total) by mouth 2 (two) times daily. 02/25/24   Laurann Pollock, MD  atorvastatin  (LIPITOR) 40 MG tablet Take 1 tablet (40 mg total) by mouth daily. 02/18/24   Roise Cleaver, MD  cholecalciferol (VITAMIN D3) 25 MCG (1000 UNIT) tablet TAKE 1 TABLET BY MOUTH EVERY DAY 02/16/24   Roise Cleaver, MD  dapagliflozin  propanediol (FARXIGA ) 10 MG TABS tablet Take 1 tablet (10 mg total) by mouth daily. 11/30/23   Roise Cleaver, MD   dorzolamide -timolol  (COSOPT ) 22.3-6.8 MG/ML ophthalmic solution Place 1 drop into both eyes daily.  08/04/16   [provider]  fluticasone  (FLONASE ) 50 MCG/ACT nasal spray SPRAY 2 SPRAYS INTO EACH NOSTRIL EVERY DAY Patient taking differently: Place 2 sprays into both nostrils daily as needed for allergies. 09/29/23   Roise Cleaver, MD  Fluticasone -Umeclidin-Vilant (TRELEGY ELLIPTA ) 100-62.5-25 MCG/ACT AEPB Inhale 1 puff into the lungs daily. 01/22/24   Lind Repine, MD  metoprolol  succinate (TOPROL -XL) 25 MG 24 hr tablet TAKE 0.5 TABLETS (12.5 MG TOTAL) BY MOUTH DAILY. FOR BLOOD PRESSURE CONTROL 01/07/24   Laurann Pollock, MD  pantoprazole  (PROTONIX ) 40 MG tablet Take 1 tablet (40 mg total) by mouth daily. For heartburn 07/28/23   Roise Cleaver, MD  potassium chloride  SA (KLOR-CON  M) 20 MEQ tablet TAKE 1 TABLET (20 MEQ TOTAL) BY MOUTH DAILY. AS A POTASSIUM SUPPLEMENT 03/21/24   Roise Cleaver, MD  rOPINIRole  (REQUIP ) 1 MG tablet Take 1 tablet (1 mg total) by mouth 3 (three) times daily. Plus an extra tablet at bedtime to total four a day 11/30/23   Roise Cleaver, MD  sacubitril -valsartan  (ENTRESTO ) 24-26 MG Take 1 tablet by mouth 2 (two) times daily. 11/30/23   Roise Cleaver, MD  torsemide  (DEMADEX ) 20 MG tablet Take 3 tablets (60 mg total) by mouth daily. 02/01/24   Roise Cleaver, MD      Allergies  Iodinated contrast media, Multihance  [gadobenate], Pravastatin , and Pravastatin  sodium    Review of Systems   Review of Systems  Constitutional:  Negative for appetite change and fatigue.  HENT:  Negative for congestion, ear discharge and sinus pressure.   Eyes:  Negative for discharge.  Respiratory:  Negative for cough.   Cardiovascular:  Positive for chest pain.  Gastrointestinal:  Negative for abdominal pain and diarrhea.  Genitourinary:  Negative for frequency and hematuria.  Musculoskeletal:  Negative for back pain.  Skin:  Negative for rash.  Neurological:  Negative for  seizures and headaches.  Psychiatric/Behavioral:  Negative for hallucinations.     Physical Exam Updated Vital Signs BP 127/77   Pulse (!) 53   Temp 97.7 F (36.5 C) (Oral)   Resp 16   Ht 6' (1.829 m)   Wt 90.7 kg   SpO2 96%   BMI 27.12 kg/m  Physical Exam Vitals and nursing note reviewed.  Constitutional:      Appearance: He is well-developed.  HENT:     Head: Normocephalic.     Nose: Nose normal.  Eyes:     General: No scleral icterus.    Conjunctiva/sclera: Conjunctivae normal.  Neck:     Thyroid : No thyromegaly.  Cardiovascular:     Rate and Rhythm: Normal rate and regular rhythm.     Heart sounds: No murmur heard.    No friction rub. No gallop.  Pulmonary:     Breath sounds: No stridor. No wheezing or rales.  Chest:     Chest wall: No tenderness.  Abdominal:     General: There is no distension.     Tenderness: There is no abdominal tenderness. There is no rebound.  Musculoskeletal:        General: Normal range of motion.     Cervical back: Neck supple.  Lymphadenopathy:     Cervical: No cervical adenopathy.  Skin:    Findings: No erythema or rash.  Neurological:     Mental Status: He is alert and oriented to person, place, and time.     Motor: No abnormal muscle tone.     Coordination: Coordination normal.  Psychiatric:        Behavior: Behavior normal.     ED Results / Procedures / Treatments   Labs (all labs ordered are listed, but only abnormal results are displayed) Labs Reviewed  COMPREHENSIVE METABOLIC PANEL WITH GFR - Abnormal; Notable for the following components:      Result Value   BUN 24 (*)    Creatinine, Ser 1.92 (*)    GFR, Estimated 34 (*)    All other components within normal limits  CBC WITH DIFFERENTIAL/PLATELET  LIPASE, BLOOD  D-DIMER, QUANTITATIVE  TROPONIN I (HIGH SENSITIVITY)  TROPONIN I (HIGH SENSITIVITY)    EKG EKG Interpretation Date/Time:  Saturday Mar 26 2024 08:16:20 EDT Ventricular Rate:  58 PR Interval:     QRS Duration:  101 QT Interval:  464 QTC Calculation: 456 R Axis:   65  Text Interpretation: Junctional rhythm Anteroseptal infarct, age indeterminate Confirmed by Cheyenne Cotta ([id number]) on 03/26/2024 12:20:39 PM  Radiology DG Chest Port 1 View Result Date: 03/26/2024 CLINICAL DATA:  Shortness of breath and chest pain EXAM: PORTABLE CHEST 1 VIEW COMPARISON:  11/15/2023 FINDINGS: Stable background emphysema pattern and mild bibasilar interstitial fibrosis, better demonstrated by CT comparison 09/05/2023. No superimposed pneumonia, CHF, large effusion or pneumothorax. Stable heart size and vascularity. Aorta atherosclerotic. Leftward tracheal deviation noted to right thyroid  enlargement by  CT. Degenerative changes noted of the spine and shoulders. IMPRESSION: 1. Emphysema and bibasilar fibrosis. 2. No acute chest process by portable radiography. Electronically Signed   By: Melven Stable.  Shick M.D.   On: 03/26/2024 09:22    Procedures Procedures    Medications Ordered in ED Medications  pantoprazole  (PROTONIX ) injection 40 mg (40 mg Intravenous Given 03/26/24 0911)    ED Course/ Medical Decision Making/ A&P  Patient with labs unremarkable and a D-dimer that is not indicative of PE.  Chest x-ray shows emphysema.                               Medical Decision Making Amount and/or Complexity of Data Reviewed Labs: ordered. Radiology: ordered.  Risk Prescription drug management.   Patient with pleuritis.  He will be sent home with tramadol         Final Clinical Impression(s) / ED Diagnoses Final diagnoses:  Pleuritis    Rx / DC Orders ED Discharge Orders          Ordered    traMADol  (ULTRAM ) 50 MG tablet        03/26/24 1222              Cheyenne Cotta, MD 03/26/24 1711

## 2024-03-31 ENCOUNTER — Ambulatory Visit: Admitting: Nurse Practitioner

## 2024-03-31 ENCOUNTER — Encounter: Payer: Self-pay | Admitting: Nurse Practitioner

## 2024-03-31 VITALS — BP 137/60 | HR 50 | Temp 97.5°F | Ht 72.0 in | Wt 197.4 lb

## 2024-03-31 DIAGNOSIS — Z09 Encounter for follow-up examination after completed treatment for conditions other than malignant neoplasm: Secondary | ICD-10-CM | POA: Diagnosis not present

## 2024-03-31 DIAGNOSIS — R109 Unspecified abdominal pain: Secondary | ICD-10-CM | POA: Insufficient documentation

## 2024-03-31 DIAGNOSIS — R091 Pleurisy: Secondary | ICD-10-CM | POA: Insufficient documentation

## 2024-03-31 NOTE — Progress Notes (Signed)
 Established Patient Office Visit  Subjective  Patient ID: Joshua Posas., male    DOB: 06-Jun-1941  Age: 83 y.o. MRN: [id number]  Chief Complaint  Patient presents with   Hospitalization Follow-up    Joshua Compton to hospital last Saturday for chest pain. Was told he may have acid reflux. Still have pain in chest, like a pulled muscle     HPI Joshua Compton. 83 year old male seen today Mar 31, 2024 for hospital discharge follow-up.  He is concerned that he is taking too many medication for blood pressure and wants to discuss it with his PCP at his next follow-up appointment. He was seen at the ED 03/26/2024 chest pain serial troponin was negative,  chest x-ray done that showed emphysema and discharged with tramadol . Reports that is still hurst to breath EKG  Mar 26 2024  Ventricular Rate:         58 PR Interval:                   QRS Duration:             101 QT Interval:                 464 QTC Calculation:456 R Axis:                         65 Junctional rhythm  Chest X-ray Emphysema and bibasilar fibrosis.   No acute chest process   Patient Active Problem List   Diagnosis Date Noted   Hospital discharge follow-up 03/31/2024   Pleuritis 03/31/2024   Abdominal pain 03/31/2024   Venous insufficiency of both lower extremities 02/01/2024   Chronic renal impairment, stage 3b (HCC) 02/01/2024   Centrilobular emphysema (HCC) 12/22/2023   High risk medication use 09/18/2023   Vitamin D  deficiency 09/18/2023   Supplemental oxygen  dependent 09/18/2023   PAF (paroxysmal atrial fibrillation) (HCC) 09/07/2023   Cerebrovascular accident (CVA) due to embolism of left middle cerebral artery (HCC) 08/10/2023   Paresthesia of both feet 07/28/2023   Gastroesophageal reflux disease with esophagitis without hemorrhage 09/10/2021   Primary insomnia 09/10/2021   Vasculogenic erectile dysfunction 09/10/2021   Status post total replacement of left hip 01/07/2021   Primary osteoarthritis of  left hip 01/06/2021   Leg cramps 12/11/2020   Thyromegaly 05/19/2017   Neoplasm of scalp 12/08/2013   Rhinitis 12/08/2013   Cough 12/08/2013   Hyperlipidemia    Arthritis    Severe stage glaucoma 03/26/2012   Nuclear sclerosis 03/26/2012   Primary open angle glaucoma 10/27/2011   Degenerative disc disease 02/04/2011   Gastrointestinal bleed 02/04/2011   Anemia 02/04/2011   Prostate cancer (HCC) 02/04/2011   Past Medical History:  Diagnosis Date   Acute heart failure with preserved ejection fraction (HFpEF) (HCC) 09/05/2023   Acute HFrEF (heart failure with reduced ejection fraction) (HCC) 09/08/2023   Acute respiratory failure with hypoxia (HCC) 09/05/2023   Arthritis    Cancer (HCC)    prostate ca   seed implants   GERD (gastroesophageal reflux disease)    Glaucoma    Hyperlipidemia    Hypertension    dr Margery Sheets     [place]   fm   Stroke ([facility]) 07/2023   Past Surgical History:  Procedure Laterality Date   BACK SURGERY     x2   COLONOSCOPY     HERNIA REPAIR  11-06-2009   left inguinal repair    NECK SURGERY  ROTATOR CUFF REPAIR Left jan 2016   TOTAL HIP ARTHROPLASTY Right 04/20/2020   Procedure: RIGHT TOTAL HIP ARTHROPLASTY ANTERIOR APPROACH;  Surgeon: Wes Hamman, MD;  Location: [facility] OR;  Service: Orthopedics;  Laterality: Right;   TOTAL HIP ARTHROPLASTY Left 01/07/2021   Procedure: LEFT TOTAL HIP ARTHROPLASTY ANTERIOR APPROACH;  Surgeon: Wes Hamman, MD;  Location: [facility] OR;  Service: Orthopedics;  Laterality: Left;   TRANSURETHRAL RESECTION OF PROSTATE     Social History   Tobacco Use   Smoking status: Former    Current packs/day: 0.00    Average packs/day: 0.5 packs/day for 30.0 years (15.0 ttl pk-yrs)    Types: Cigarettes    Start date: 03/29/1975    Quit date: 03/28/2005    Years since quitting: 19.0   Smokeless tobacco: Never  Vaping Use   Vaping status: Never Used  Substance Use Topics   Alcohol use: No    Alcohol/week: 0.0 standard drinks of alcohol    Drug use: No   Social History   Socioeconomic History   Marital status: Married    Spouse name: Not on file   Number of children: 5   Years of education: 11   Highest education level: 12th grade  Occupational History   Occupation: Nutritional therapist    Comment: retired  Tobacco Use   Smoking status: Former    Current packs/day: 0.00    Average packs/day: 0.5 packs/day for 30.0 years (15.0 ttl pk-yrs)    Types: Cigarettes    Start date: 03/29/1975    Quit date: 03/28/2005    Years since quitting: 19.0   Smokeless tobacco: Never  Vaping Use   Vaping status: Never Used  Substance and Sexual Activity   Alcohol use: No    Alcohol/week: 0.0 standard drinks of alcohol   Drug use: No   Sexual activity: Yes  Other Topics Concern   Not on file  Social History Narrative   Grandson lives with them - no stairs   Social Drivers of Corporate investment banker Strain: Low Risk  (02/23/2024)   Overall Financial Resource Strain (CARDIA)    Difficulty of Paying Living Expenses: Not hard at all  Food Insecurity: No Food Insecurity (02/23/2024)   Hunger Vital Sign    Worried About Running Out of Food in the Last Year: Never true    Ran Out of Food in the Last Year: Never true  Transportation Needs: No Transportation Needs (02/23/2024)   PRAPARE - Administrator, Civil Service (Medical): No    Lack of Transportation (Non-Medical): No  Physical Activity: Unknown (02/23/2024)   Exercise Vital Sign    Days of Exercise per Week: 0 days    Minutes of Exercise per Session: Not on file  Stress: No Stress Concern Present (02/23/2024)   [employer] of Occupational Health - Occupational Stress Questionnaire    Feeling of Stress : Not at all  Social Connections: Moderately Integrated (02/23/2024)   Social Connection and Isolation Panel [NHANES]    Frequency of Communication with Friends and Family: More than three times a week    Frequency of Social Gatherings with Friends and Family:  Twice a week    Attends Religious Services: More than 4 times per year    Active Member of [employer] or Organizations: No    Attends Banker Meetings: Not on file    Marital Status: Married  Intimate Partner Violence: Not At Risk (09/05/2023)   Humiliation, Afraid, Rape, and  Kick questionnaire    Fear of Current or Ex-Partner: No    Emotionally Abused: No    Physically Abused: No    Sexually Abused: No   Family Status  Relation Name Status   Mother  Deceased   Father  Deceased   Sister  Alive   Brother  Alive   Brother  Alive   Sister  Alive   Sister  Deceased   Sister  Deceased   Sister  Deceased   Brother  Alive   Brother  Deceased   Daughter  Alive   Son  Alive   Daughter  Alive   Daughter  Alive   Daughter  Alive  No partnership data on file   Family History  Problem Relation Age of Onset   Cancer Mother        breast   Heart disease Father    Cancer Brother    Cancer Brother    Allergies  Allergen Reactions   Iodinated Contrast Media Anaphylaxis    Tongue swelled up when he had MRI in [place]  Intubated for airway protection after oral and throat swelling   Multihance  [Gadobenate] Swelling    Tongue swelled up when he had MRI in [place]   Pravastatin  Other (See Comments)    Muscle aches   Pravastatin  Sodium     Muscle aches      Review of Systems  Constitutional:  Negative for chills and fever.  HENT:  Negative for sinus pain and sore throat.   Respiratory:  Negative for cough, shortness of breath and wheezing.   Cardiovascular:  Negative for chest pain and leg swelling.  Gastrointestinal:  Negative for abdominal pain, constipation, nausea and vomiting.  Skin:  Negative for itching and rash.  Neurological:  Negative for dizziness and headaches.   Negative unless indicated in HPI   Objective:     BP 137/60   Pulse (!) 50   Temp (!) 97.5 F (36.4 C) (Temporal)   Ht 6' (1.829 m)   Wt 197 lb 6.4 oz (89.5 kg)   SpO2 94%   BMI  26.77 kg/m  BP Readings from Last 3 Encounters:  03/31/24 137/60  03/26/24 127/77  03/03/24 137/63   Wt Readings from Last 3 Encounters:  03/31/24 197 lb 6.4 oz (89.5 kg)  03/26/24 200 lb (90.7 kg)  03/03/24 200 lb 9.6 oz (91 kg)      Physical Exam Vitals and nursing note reviewed.  Constitutional:      General: He is not in acute distress. HENT:     Head: Normocephalic and atraumatic.     Right Ear: Tympanic membrane, ear canal and external ear normal. There is no impacted cerumen.     Left Ear: Tympanic membrane, ear canal and external ear normal. There is no impacted cerumen.     Nose: Nose normal. No congestion.     Mouth/Throat:     Mouth: Mucous membranes are moist.  Eyes:     General: No scleral icterus.    Extraocular Movements: Extraocular movements intact.     Conjunctiva/sclera: Conjunctivae normal.     Pupils: Pupils are equal, round, and reactive to light.  Cardiovascular:     Heart sounds: Normal heart sounds.  Pulmonary:     Effort: Pulmonary effort is normal.     Breath sounds: Normal breath sounds.  Abdominal:     General: Bowel sounds are normal.     Palpations: Abdomen is soft.  Musculoskeletal:  General: Normal range of motion.     Right lower leg: No edema.     Left lower leg: No edema.  Skin:    General: Skin is warm and dry.     Findings: No rash.  Neurological:     Mental Status: He is alert and oriented to person, place, and time.  Psychiatric:        Mood and Affect: Mood normal.        Behavior: Behavior normal.        Thought Content: Thought content normal.        Judgment: Judgment normal.      No results found for any visits on 03/31/24.  Last CBC Lab Results  Component Value Date   WBC 5.9 03/26/2024   HGB 13.1 03/26/2024   HCT 41.0 03/26/2024   MCV 96.5 03/26/2024   MCH 30.8 03/26/2024   RDW 14.6 03/26/2024   PLT 202 03/26/2024   Last metabolic panel Lab Results  Component Value Date   GLUCOSE 99 03/26/2024    NA 139 03/26/2024   K 3.9 03/26/2024   CL 102 03/26/2024   CO2 27 03/26/2024   BUN 24 (H) 03/26/2024   CREATININE 1.92 (H) 03/26/2024   GFRNONAA 34 (L) 03/26/2024   CALCIUM  9.2 03/26/2024   PHOS 3.7 06/11/2021   PROT 6.8 03/26/2024   ALBUMIN 3.5 03/26/2024   LABGLOB 2.3 11/30/2023   AGRATIO 2.1 12/16/2022   BILITOT 0.7 03/26/2024   ALKPHOS 108 03/26/2024   AST 18 03/26/2024   ALT 18 03/26/2024   ANIONGAP 10 03/26/2024   Last lipids Lab Results  Component Value Date   CHOL 141 09/18/2023   HDL 62 09/18/2023   LDLCALC 66 09/18/2023   TRIG 60 09/18/2023   CHOLHDL 2.3 09/18/2023   Last hemoglobin A1c Lab Results  Component Value Date   HGBA1C 5.6 07/28/2023   Last thyroid  functions Lab Results  Component Value Date   TSH 1.470 09/18/2023   T4TOTAL 9.8 09/18/2023        Assessment & Plan:  Pleuritis  Abdominal pain, unspecified abdominal location  Hospital discharge follow-up   Khyri is a 83 year old African-American male seen today for posthospital discharge, no acute distress Continue tramadol  50 mg as prescribed by ED Continue taking all previously prescribed medication - Discuss medication concerns with PCP at next follow-up appointment The above assessment and management plan was discussed with the patient. The patient verbalized understanding of and has agreed to the management plan. Patient is aware to call the clinic if they develop any new symptoms or if symptoms persist or worsen. Patient is aware when to return to the clinic for a follow-up visit. Patient educated on when it is appropriate to go to the emergency department.  Return for follow-up with PCP as already scheduled.    Alizandra Loh [place], DNP [facility] [address] [place], [place] [address] [phone]    Note: This document was prepared by [employer] voice dictation technology and any errors that results from this process are unintentional.

## 2024-04-05 ENCOUNTER — Encounter: Payer: Self-pay | Admitting: Family Medicine

## 2024-04-05 ENCOUNTER — Ambulatory Visit (INDEPENDENT_AMBULATORY_CARE_PROVIDER_SITE_OTHER): Admitting: Family Medicine

## 2024-04-05 VITALS — BP 125/55 | HR 69 | Temp 97.8°F | Ht 72.0 in | Wt 197.0 lb

## 2024-04-05 DIAGNOSIS — I480 Paroxysmal atrial fibrillation: Secondary | ICD-10-CM | POA: Diagnosis not present

## 2024-04-05 DIAGNOSIS — H409 Unspecified glaucoma: Secondary | ICD-10-CM

## 2024-04-05 DIAGNOSIS — R0789 Other chest pain: Secondary | ICD-10-CM | POA: Diagnosis not present

## 2024-04-05 DIAGNOSIS — L98491 Non-pressure chronic ulcer of skin of other sites limited to breakdown of skin: Secondary | ICD-10-CM

## 2024-04-05 MED ORDER — ENTRESTO 24-26 MG PO TABS: 1.0000 | ORAL_TABLET | Freq: Two times a day (BID) | ORAL | 3 refills | Status: DC

## 2024-04-05 MED ORDER — METHYLPREDNISOLONE ACETATE 40 MG/ML IJ SUSP
40.0000 mg | Freq: Once | INTRAMUSCULAR | Status: AC
  Administered 2024-04-05: 40 mg via INTRAMUSCULAR

## 2024-04-05 MED ORDER — BETAMETHASONE SOD PHOS & ACET 6 (3-3) MG/ML IJ SUSP: 6.0000 mg | Freq: Once | INTRAMUSCULAR | Status: DC

## 2024-04-05 MED ORDER — AMOXICILLIN 875 MG PO TABS: 875.0000 mg | ORAL_TABLET | Freq: Two times a day (BID) | ORAL | 0 refills | Status: AC

## 2024-04-05 NOTE — Progress Notes (Signed)
 Subjective:  Patient ID: Joshua Compton., male    DOB: 15-Jul-1941  Age: 83 y.o. MRN: [id number]  CC: ER follow up (Sore in chest/ ribs/ shoulder. Given pain pills. Has no clue why how this started. Coughing and clearing throat a lot and that hurts his chest. No SOB. )   HPI Joshua Compton. presents for soreness in the chest and the left shoulder.  Also between the shoulder blades.  It hurts worse when he twists his torso.  He also states the pain is sharp and at times can be 7/10.  It is not causing him any shortness of breath.  The chest pain is not precordial.  Patient was originally scheduled today for recheck of the swelling and venous stasis ulcers treated most recently 1 month ago.  He states there is some swelling     02/01/2024    3:18 PM 10/19/2023    1:08 PM 07/28/2023    8:57 AM  Depression screen PHQ 2/9  Decreased Interest 0 0 0  Down, Depressed, Hopeless 0 0 0  PHQ - 2 Score 0 0 0  Altered sleeping 0    Tired, decreased energy 0    Change in appetite 0    Feeling bad or failure about yourself  0    Trouble concentrating 0    Moving slowly or fidgety/restless 0    Suicidal thoughts 0    PHQ-9 Score 0    Difficult doing work/chores Not difficult at all      History Kolston has a past medical history of Acute heart failure with preserved ejection fraction (HFpEF) (HCC) (09/05/2023), Acute HFrEF (heart failure with reduced ejection fraction) (HCC) (09/08/2023), Acute respiratory failure with hypoxia (HCC) (09/05/2023), Arthritis, Cancer (HCC), GERD (gastroesophageal reflux disease), Glaucoma, Hyperlipidemia, Hypertension, and Stroke (HCC) (07/2023).   He has a past surgical history that includes Back surgery; Neck surgery; Transurethral resection of prostate; Hernia repair (11-06-2009); Rotator cuff repair (Left, jan 2016); Colonoscopy; Total hip arthroplasty (Right, 04/20/2020); and Total hip arthroplasty (Left, 01/07/2021).   His family history includes Cancer  in his brother, brother, and mother; Heart disease in his father.He reports that he quit smoking about 19 years ago. His smoking use included cigarettes. He started smoking about 49 years ago. He has a 15 pack-year smoking history. He has never used smokeless tobacco. He reports that he does not drink alcohol and does not use drugs.    ROS Review of Systems  Constitutional:  Negative for fever.  Respiratory:  Negative for shortness of breath.   Cardiovascular:  Negative for chest pain.  Musculoskeletal:  Negative for arthralgias.  Skin:  Negative for rash.    Objective:  BP (!) 125/55   Pulse 69   Temp 97.8 F (36.6 C)   Ht 6' (1.829 m)   Wt 197 lb (89.4 kg)   SpO2 94%   BMI 26.72 kg/m   BP Readings from Last 3 Encounters:  04/05/24 (!) 125/55  03/31/24 137/60  03/26/24 127/77    Wt Readings from Last 3 Encounters:  04/05/24 197 lb (89.4 kg)  03/31/24 197 lb 6.4 oz (89.5 kg)  03/26/24 200 lb (90.7 kg)     Physical Exam Vitals reviewed.  Constitutional:      Appearance: He is well-developed.  HENT:     Head: Normocephalic and atraumatic.     Right Ear: External ear normal.     Left Ear: External ear normal.     Mouth/Throat:  Pharynx: No oropharyngeal exudate or posterior oropharyngeal erythema.  Eyes:     Pupils: Pupils are equal, round, and reactive to light.  Cardiovascular:     Rate and Rhythm: Normal rate and regular rhythm.     Heart sounds: No murmur heard. Pulmonary:     Effort: No respiratory distress.     Breath sounds: Normal breath sounds.  Musculoskeletal:     Cervical back: Normal range of motion and neck supple.     Right lower leg: Edema present.     Left lower leg: Edema present.  Skin:    Findings: Lesion (Each leg is noted to have 2+ edema of the lower legs to the mid calf.  Both have grade 1 denuded epithelial ulcerations that measure 1 x 4 cm each.) present.  Neurological:     Mental Status: He is alert and oriented to person,  place, and time.      Assessment & Plan:  Chest wall pain -     Betamethasone  Sod Phos & Acet -     methylPREDNISolone  Acetate  PAF (paroxysmal atrial fibrillation) (HCC)  Glaucoma of left eye, unspecified glaucoma type -     Ambulatory referral to Ophthalmology  Skin ulcer, limited to breakdown of skin (HCC) -     methylPREDNISolone  Acetate  Other orders -     Entresto ; Take 1 tablet by mouth 2 (two) times daily.  Dispense: 60 tablet; Refill: 3 -     Amoxicillin ; Take 1 tablet (875 mg total) by mouth 2 (two) times daily for 10 days.  Dispense: 20 tablet; Refill: 0     Follow-up: Return in about 1 week (around 04/12/2024) for swelling in legs, unna boots.  Roise Cleaver, M.D.

## 2024-04-12 ENCOUNTER — Encounter: Payer: Self-pay | Admitting: Family Medicine

## 2024-04-12 ENCOUNTER — Ambulatory Visit (INDEPENDENT_AMBULATORY_CARE_PROVIDER_SITE_OTHER): Admitting: Family Medicine

## 2024-04-12 VITALS — BP 133/60 | HR 57 | Temp 97.3°F | Ht 72.0 in | Wt 193.0 lb

## 2024-04-12 DIAGNOSIS — M7918 Myalgia, other site: Secondary | ICD-10-CM

## 2024-04-12 DIAGNOSIS — G894 Chronic pain syndrome: Secondary | ICD-10-CM | POA: Diagnosis not present

## 2024-04-12 DIAGNOSIS — R0789 Other chest pain: Secondary | ICD-10-CM

## 2024-04-12 MED ORDER — PREDNISONE 10 MG PO TABS: ORAL_TABLET | ORAL | 0 refills | Status: DC

## 2024-04-12 NOTE — Progress Notes (Signed)
 Subjective:  Patient ID: Joshua Compton., male    DOB: Mar 22, 1941  Age: 83 y.o. MRN: [id number]  CC: No chief complaint on file.   HPI [name]. presents for still having moderately severe pain in the anterior lower chest.  This radiates up into the shoulders and he is having a great deal of pain when he tries to abduct at the shoulders and when he tries to twist the torso.  Specifically the pain when he twists is in the lateral aspect of the back near the posterior axillary region.  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  He said he did not want to get it wet.  Today the lesion is bleeding from the right the left leg lesion has cleared.     04/12/2024   11:16 AM 02/01/2024    3:18 PM 10/19/2023    1:08 PM  Depression screen PHQ 2/9  Decreased Interest 0 0 0  Down, Depressed, Hopeless 0 0 0  PHQ - 2 Score 0 0 0  Altered sleeping  0   Tired, decreased energy  0   Change in appetite  0   Feeling bad or failure about yourself   0   Trouble concentrating  0   Moving slowly or fidgety/restless  0   Suicidal thoughts  0   PHQ-9 Score  0   Difficult doing work/chores  Not difficult at all     History Jarret has a past medical history of Acute heart failure with preserved ejection fraction (HFpEF) (HCC) (09/05/2023), Acute HFrEF (heart failure with reduced ejection fraction) (HCC) (09/08/2023), Acute respiratory failure with hypoxia (HCC) (09/05/2023), Arthritis, Cancer (HCC), GERD (gastroesophageal reflux disease), Glaucoma, Hyperlipidemia, Hypertension, and Stroke (HCC) (07/2023).   He has a past surgical history that includes Back surgery; Neck surgery; Transurethral resection of prostate; Hernia repair (11-06-2009); Rotator cuff repair (Left, jan 2016); Colonoscopy; Total hip arthroplasty (Right, 04/20/2020); and Total hip arthroplasty (Left, 01/07/2021).   His family history includes Cancer in his brother, brother, and mother; Heart disease in  his father.He reports that he quit smoking about 19 years ago. His smoking use included cigarettes. He started smoking about 49 years ago. He has a 15 pack-year smoking history. He has never used smokeless tobacco. He reports that he does not drink alcohol and does not use drugs.    ROS Review of Systems  Objective:  BP 133/60   Pulse (!) 57   Temp (!) 97.3 F (36.3 C) (Temporal)   Ht 6' (1.829 m)   Wt 193 lb (87.5 kg)   SpO2 92%   BMI 26.18 kg/m   BP Readings from Last 3 Encounters:  04/12/24 133/60  04/05/24 (!) 125/55  03/31/24 137/60    Wt Readings from Last 3 Encounters:  04/12/24 193 lb (87.5 kg)  04/05/24 197 lb (89.4 kg)  03/31/24 197 lb 6.4 oz (89.5 kg)     Physical Exam Vitals reviewed.  Constitutional:      Appearance: He is well-developed.  HENT:     Head: Normocephalic and atraumatic.     Right Ear: External ear normal.     Left Ear: External ear normal.     Mouth/Throat:     Pharynx: No oropharyngeal exudate or posterior oropharyngeal erythema.  Eyes:     Pupils: Pupils are equal, round, and reactive to light.  Cardiovascular:     Rate and Rhythm: Normal rate and regular rhythm.     Heart  sounds: No murmur heard. Pulmonary:     Effort: No respiratory distress.     Breath sounds: Normal breath sounds.  Musculoskeletal:        General: Signs of injury (There is an open bleeding wound approximately 1 x 2 cm at the right mid shin just to the lateral aspect and inferior.  Dressing was applied with Neosporin and gauze) present.     Cervical back: Normal range of motion and neck supple.  Neurological:     Mental Status: He is alert and oriented to person, place, and time.      Assessment & Plan:  Amplified musculoskeletal pain, localized -     CBC with Differential/Platelet -     CMP14+EGFR -     Sedimentation rate -     Rheumatoid factor -     CK  Chest pain, atypical -     D-dimer, quantitative  Other orders -     predniSONE ; Take 5 daily  for 3 days followed by 4,3,2 and 1 for 3 days each.  Dispense: 45 tablet; Refill: 0   Wound care for the right lower extremity was discussed.  He should use Neosporin and gauze dressing daily.  Cleansing should be with warm soapy water  rinse with clear water  pat dry and apply the dressing.  Monitor for signs of infection including redness swelling increasing pain or purulent drainage.  Follow-up: Return in about 1 week (around 04/19/2024).  Roise Cleaver, M.D.

## 2024-04-13 LAB — CMP14+EGFR
ALT: 19 IU/L (ref 0–44)
AST: 23 IU/L (ref 0–40)
Albumin: 4.5 g/dL (ref 3.7–4.7)
Alkaline Phosphatase: 141 IU/L — ABNORMAL HIGH (ref 44–121)
BUN/Creatinine Ratio: 13 (ref 10–24)
BUN: 26 mg/dL (ref 8–27)
Bilirubin Total: 0.5 mg/dL (ref 0.0–1.2)
CO2: 24 mmol/L (ref 20–29)
Calcium: 9.9 mg/dL (ref 8.6–10.2)
Chloride: 102 mmol/L (ref 96–106)
Creatinine, Ser: 1.96 mg/dL — ABNORMAL HIGH (ref 0.76–1.27)
Globulin, Total: 3.1 g/dL (ref 1.5–4.5)
Glucose: 77 mg/dL (ref 70–99)
Potassium: 4.5 mmol/L (ref 3.5–5.2)
Sodium: 144 mmol/L (ref 134–144)
Total Protein: 7.6 g/dL (ref 6.0–8.5)
eGFR: 33 mL/min/{1.73_m2} — ABNORMAL LOW (ref >59)

## 2024-04-13 LAB — CBC WITH DIFFERENTIAL/PLATELET
Basophils Absolute: 0.1 10*3/uL (ref 0.0–0.2)
Basos: 1 %
EOS (ABSOLUTE): 0.2 10*3/uL (ref 0.0–0.4)
Eos: 2 %
Hematocrit: 44.5 % (ref 37.5–51.0)
Hemoglobin: 14.5 g/dL (ref 13.0–17.7)
Immature Grans (Abs): 0 10*3/uL (ref 0.0–0.1)
Immature Granulocytes: 1 %
Lymphocytes Absolute: 1.6 10*3/uL (ref 0.7–3.1)
Lymphs: 24 %
MCH: 30.5 pg (ref 26.6–33.0)
MCHC: 32.6 g/dL (ref 31.5–35.7)
MCV: 94 fL (ref 79–97)
Monocytes Absolute: 0.6 10*3/uL (ref 0.1–0.9)
Monocytes: 9 %
Neutrophils Absolute: 4.2 10*3/uL (ref 1.4–7.0)
Neutrophils: 63 %
Platelets: 300 10*3/uL (ref 150–450)
RBC: 4.75 x10E6/uL (ref 4.14–5.80)
RDW: 13.2 % (ref 11.6–15.4)
WBC: 6.6 10*3/uL (ref 3.4–10.8)

## 2024-04-13 LAB — SEDIMENTATION RATE: Sed Rate: 29 mm/h (ref 0–30)

## 2024-04-13 LAB — RHEUMATOID FACTOR

## 2024-04-13 LAB — CK: Total CK: 254 U/L — ABNORMAL HIGH (ref 30–208)

## 2024-04-13 LAB — D-DIMER, QUANTITATIVE: D-DIMER: 0.81 mg{FEU}/L — ABNORMAL HIGH (ref 0.00–0.49)

## 2024-04-15 ENCOUNTER — Ambulatory Visit: Payer: Self-pay | Admitting: Family Medicine

## 2024-04-16 ENCOUNTER — Other Ambulatory Visit: Payer: Self-pay

## 2024-04-16 ENCOUNTER — Emergency Department (HOSPITAL_COMMUNITY)
Admission: EM | Admit: 2024-04-16 | Discharge: 2024-04-16 | Disposition: A | Discharge status: Home / Self Care | Attending: Emergency Medicine | Admitting: Emergency Medicine

## 2024-04-16 ENCOUNTER — Encounter (HOSPITAL_COMMUNITY): Payer: Self-pay

## 2024-04-16 ENCOUNTER — Emergency Department (HOSPITAL_COMMUNITY)

## 2024-04-16 DIAGNOSIS — R0602 Shortness of breath: Secondary | ICD-10-CM | POA: Diagnosis not present

## 2024-04-16 DIAGNOSIS — Z8673 Personal history of transient ischemic attack (TIA), and cerebral infarction without residual deficits: Secondary | ICD-10-CM | POA: Diagnosis not present

## 2024-04-16 DIAGNOSIS — S2232XA Fracture of one rib, left side, initial encounter for closed fracture: Secondary | ICD-10-CM | POA: Diagnosis not present

## 2024-04-16 DIAGNOSIS — R0789 Other chest pain: Secondary | ICD-10-CM | POA: Diagnosis not present

## 2024-04-16 DIAGNOSIS — Z79899 Other long term (current) drug therapy: Secondary | ICD-10-CM | POA: Diagnosis not present

## 2024-04-16 DIAGNOSIS — R7989 Other specified abnormal findings of blood chemistry: Secondary | ICD-10-CM | POA: Diagnosis not present

## 2024-04-16 DIAGNOSIS — X58XXXA Exposure to other specified factors, initial encounter: Secondary | ICD-10-CM | POA: Insufficient documentation

## 2024-04-16 DIAGNOSIS — M4804 Spinal stenosis, thoracic region: Secondary | ICD-10-CM | POA: Diagnosis not present

## 2024-04-16 DIAGNOSIS — Z8546 Personal history of malignant neoplasm of prostate: Secondary | ICD-10-CM | POA: Insufficient documentation

## 2024-04-16 DIAGNOSIS — J22 Unspecified acute lower respiratory infection: Secondary | ICD-10-CM | POA: Insufficient documentation

## 2024-04-16 DIAGNOSIS — R918 Other nonspecific abnormal finding of lung field: Secondary | ICD-10-CM | POA: Diagnosis not present

## 2024-04-16 DIAGNOSIS — I10 Essential (primary) hypertension: Secondary | ICD-10-CM | POA: Diagnosis not present

## 2024-04-16 DIAGNOSIS — R079 Chest pain, unspecified: Secondary | ICD-10-CM | POA: Diagnosis not present

## 2024-04-16 DIAGNOSIS — J439 Emphysema, unspecified: Secondary | ICD-10-CM | POA: Diagnosis not present

## 2024-04-16 DIAGNOSIS — J189 Pneumonia, unspecified organism: Secondary | ICD-10-CM

## 2024-04-16 DIAGNOSIS — M4724 Other spondylosis with radiculopathy, thoracic region: Secondary | ICD-10-CM | POA: Diagnosis not present

## 2024-04-16 DIAGNOSIS — Z7901 Long term (current) use of anticoagulants: Secondary | ICD-10-CM | POA: Insufficient documentation

## 2024-04-16 LAB — CBC
HCT: 43 % (ref 39.0–52.0)
Hemoglobin: 13.5 g/dL (ref 13.0–17.0)
MCH: 30 pg (ref 26.0–34.0)
MCHC: 31.4 g/dL (ref 30.0–36.0)
MCV: 95.6 fL (ref 80.0–100.0)
Platelets: 313 10*3/uL (ref 150–400)
RBC: 4.5 MIL/uL (ref 4.22–5.81)
RDW: 15.1 % (ref 11.5–15.5)
WBC: 11.4 10*3/uL — ABNORMAL HIGH (ref 4.0–10.5)
nRBC: 0 % (ref 0.0–0.2)

## 2024-04-16 LAB — BASIC METABOLIC PANEL WITH GFR
Anion gap: 12 (ref 5–15)
BUN: 36 mg/dL — ABNORMAL HIGH (ref 8–23)
CO2: 25 mmol/L (ref 22–32)
Calcium: 9.4 mg/dL (ref 8.9–10.3)
Chloride: 103 mmol/L (ref 98–111)
Creatinine, Ser: 2.15 mg/dL — ABNORMAL HIGH (ref 0.61–1.24)
GFR, Estimated: 30 mL/min — ABNORMAL LOW (ref >60)
Glucose, Bld: 93 mg/dL (ref 70–99)
Potassium: 3.9 mmol/L (ref 3.5–5.1)
Sodium: 140 mmol/L (ref 135–145)

## 2024-04-16 LAB — TROPONIN I (HIGH SENSITIVITY): Troponin I (High Sensitivity): 9 ng/L (ref <18)

## 2024-04-16 MED ORDER — DOXYCYCLINE HYCLATE 100 MG PO TABS
100.0000 mg | ORAL_TABLET | Freq: Once | ORAL | Status: AC
  Administered 2024-04-16: 100 mg via ORAL
  Filled 2024-04-16: qty 1

## 2024-04-16 MED ORDER — OXYCODONE HCL 10 MG PO TABS: 10.0000 mg | ORAL_TABLET | Freq: Three times a day (TID) | ORAL | 0 refills | Status: DC | PRN

## 2024-04-16 MED ORDER — SODIUM CHLORIDE 0.9 % IV SOLN
1.0000 g | Freq: Once | INTRAVENOUS | Status: AC
  Administered 2024-04-16: 1 g via INTRAVENOUS
  Filled 2024-04-16: qty 10

## 2024-04-16 MED ORDER — DOXYCYCLINE HYCLATE 100 MG PO CAPS: 100.0000 mg | ORAL_CAPSULE | Freq: Two times a day (BID) | ORAL | 0 refills | Status: AC

## 2024-04-16 MED ORDER — OXYCODONE HCL 5 MG PO TABS
5.0000 mg | ORAL_TABLET | Freq: Once | ORAL | Status: AC
  Administered 2024-04-16: 5 mg via ORAL
  Filled 2024-04-16: qty 1

## 2024-04-16 MED ORDER — SENNOSIDES-DOCUSATE SODIUM 8.6-50 MG PO TABS: 1.0000 | ORAL_TABLET | Freq: Every day | ORAL | 0 refills | Status: AC | PRN

## 2024-04-16 NOTE — ED Provider Notes (Signed)
 [place] [facility] Provider Note   CSN: [id number] Arrival date & time: 04/16/24  1034     History  Chief Complaint  Patient presents with   Chest Pain   Shortness of Breath    Joshua Compton. is a 83 y.o. male with history of hypertension, hyperlipidemia, prostate cancer, stroke with residual right-sided hemiparesis, reflux, presented to ED with chest pain and back pain.  Patient reports about 1 month of persistent chest pain across his lower chest and abdomen, also his left shoulder.  The shoulder pain is worse with movement and also having pain in his mid back worse with movement and deep inspiration.  He denies any cough, fevers, chills.  He said his doctor has prescribed him tramadol  which is not helping his pain.  He saw his PCP in the office earlier this week who did some blood work and noted his D-dimer is mildly elevated 0.81, adjusted for age barely abnormal.  He is compliant on Eliquis .    HPI     Home Medications Prior to Admission medications   Medication Sig Start Date End Date Taking? Authorizing Provider  doxycycline  (VIBRAMYCIN ) 100 MG capsule Take 1 capsule (100 mg total) by mouth 2 (two) times daily for 7 days. 04/16/24 04/23/24 Yes Zanai Mallari, Janalyn Me, MD  oxyCODONE  10 MG TABS Take 1 tablet (10 mg total) by mouth every 8 (eight) hours as needed for up to 15 doses for severe pain (pain score 7-10). 04/16/24  Yes Arvilla Birmingham, MD  senna-docusate (SENOKOT-S) 8.6-50 MG tablet Take 1 tablet by mouth daily as needed for mild constipation. 04/16/24 05/16/24 Yes Earnestine Shipp, Janalyn Me, MD  acetaminophen  (TYLENOL ) 500 MG tablet Take 1,000 mg by mouth every 6 (six) hours as needed for mild pain (pain score 1-3).    [provider]  amiodarone  (PACERONE ) 200 MG tablet Take 1 tablet (200 mg total) by mouth daily. 02/25/24   Laurann Pollock, MD  apixaban  (ELIQUIS ) 2.5 MG TABS tablet Take 1 tablet (2.5 mg total) by mouth 2 (two) times  daily. 02/25/24   Laurann Pollock, MD  atorvastatin  (LIPITOR) 40 MG tablet Take 1 tablet (40 mg total) by mouth daily. 02/18/24   Roise Cleaver, MD  cholecalciferol (VITAMIN D3) 25 MCG (1000 UNIT) tablet TAKE 1 TABLET BY MOUTH EVERY DAY 02/16/24   Roise Cleaver, MD  dapagliflozin  propanediol (FARXIGA ) 10 MG TABS tablet Take 1 tablet (10 mg total) by mouth daily. 11/30/23   Roise Cleaver, MD  dorzolamide -timolol  (COSOPT ) 22.3-6.8 MG/ML ophthalmic solution Place 1 drop into both eyes daily.  08/04/16   [provider]  fluticasone  (FLONASE ) 50 MCG/ACT nasal spray SPRAY 2 SPRAYS INTO EACH NOSTRIL EVERY DAY Patient taking differently: Place 2 sprays into both nostrils daily as needed for allergies. 09/29/23   Roise Cleaver, MD  Fluticasone -Umeclidin-Vilant (TRELEGY ELLIPTA ) 100-62.5-25 MCG/ACT AEPB Inhale 1 puff into the lungs daily. 01/22/24   Lind Repine, MD  metoprolol  succinate (TOPROL -XL) 25 MG 24 hr tablet TAKE 0.5 TABLETS (12.5 MG TOTAL) BY MOUTH DAILY. FOR BLOOD PRESSURE CONTROL 01/07/24   Laurann Pollock, MD  pantoprazole  (PROTONIX ) 40 MG tablet Take 1 tablet (40 mg total) by mouth daily. For heartburn 07/28/23   Roise Cleaver, MD  potassium chloride  SA (KLOR-CON  M) 20 MEQ tablet TAKE 1 TABLET (20 MEQ TOTAL) BY MOUTH DAILY. AS A POTASSIUM SUPPLEMENT 03/21/24   Roise Cleaver, MD  predniSONE  (DELTASONE ) 10 MG tablet Take 5 daily for 3  days followed by 4,3,2 and 1 for 3 days each. 04/12/24   Roise Cleaver, MD  rOPINIRole  (REQUIP ) 1 MG tablet Take 1 tablet (1 mg total) by mouth 3 (three) times daily. Plus an extra tablet at bedtime to total four a day 11/30/23   Roise Cleaver, MD  sacubitril -valsartan  (ENTRESTO ) 24-26 MG Take 1 tablet by mouth 2 (two) times daily. 04/05/24   Roise Cleaver, MD  torsemide  (DEMADEX ) 20 MG tablet Take 3 tablets (60 mg total) by mouth daily. 02/01/24   Roise Cleaver, MD  traMADol  (ULTRAM ) 50 MG tablet Take 1 every 6 hours for pain not relieved by Tylenol  alone  03/26/24   Zammit, Joseph, MD      Allergies    Iodinated contrast media, Multihance  [gadobenate], Pravastatin , and Pravastatin  sodium    Review of Systems   Review of Systems  Physical Exam Updated Vital Signs BP (!) 143/68   Pulse (!) 57   Temp 97.9 F (36.6 C)   Resp 13   SpO2 97%  Physical Exam  ED Results / Procedures / Treatments   Labs (all labs ordered are listed, but only abnormal results are displayed) Labs Reviewed  BASIC METABOLIC PANEL WITH GFR - Abnormal; Notable for the following components:      Result Value   BUN 36 (*)    Creatinine, Ser 2.15 (*)    GFR, Estimated 30 (*)    All other components within normal limits  CBC - Abnormal; Notable for the following components:   WBC 11.4 (*)    All other components within normal limits  TROPONIN I (HIGH SENSITIVITY)    EKG EKG Interpretation Date/Time:  Saturday Apr 16 2024 10:41:20 EDT Ventricular Rate:  59 PR Interval:  166 QRS Duration:  102 QT Interval:  440 QTC Calculation: 435 R Axis:   58  Text Interpretation: Sinus bradycardia Otherwise normal ECG When compared with ECG of 26-Mar-2024 08:16, PREVIOUS ECG IS PRESENT Confirmed by Jerald Molly [phone]) on 04/16/2024 11:37:34 AM  Radiology CT Chest Wo Contrast Result Date: 04/16/2024 CLINICAL DATA:  Pneumonia, complication suspected, xray done EXAM: CT CHEST WITHOUT CONTRAST TECHNIQUE: Multidetector CT imaging of the chest was performed following the standard protocol without IV contrast. RADIATION DOSE REDUCTION: This exam was performed according to the departmental dose-optimization program which includes automated exposure control, adjustment of the mA and/or kV according to patient size and/or use of iterative reconstruction technique. COMPARISON:  September 05, 2023, June 19, 2021 FINDINGS: Cardiovascular: Heart is mildly enlarged. No pericardial effusion. Three-vessel coronary artery atherosclerotic calcifications. Atherosclerotic calcifications of  the nonaneurysmal thoracic aorta. Mediastinum/Nodes: Similar appearance of the RIGHT thyroid , previously evaluated by ultrasound in 2022 a history of remote biopsy. No axillary or mediastinal adenopathy. Lungs/Pleura: No pleural effusion or pneumothorax. Diffuse bronchial thickening. Severe centrilobular and paraseptal emphysematous changes. In the LEFT lower lobe, peripherally there is increased soft tissue density within an area of paraseptal emphysema compared to prior. This area spans 31 by 13 by 22 mm, previously 13 by 8 by 8 mm in representative cross-sectional area (series 7, image 229). It demonstrates a near rind like appearance and appears to immediately involve the pleural surface. (Series 4, image 127). Mildly increased conspicuity of a perifissural nodule measuring 7 by 3 mm (series 3, image 167; series 6, images 106). LEFT lower lobe linear nodule measures 11 by 3 mm, newly conspicuous (series 6, image 100). Upper Abdomen: No acute abnormality. Musculoskeletal: Gynecomastia. Degenerative changes of the thoracic spine. Expansile area of lucency  in the LEFT posterior fourth rib with possible associated nondisplaced rib fracture. IMPRESSION: 1. There is increased masslike soft tissue density within an area of paraseptal emphysema in the LEFT lower lobe. This is concerning for primary lung neoplasm although superimposed infection is in the differential. Recommend consideration of dedicated PET/CT and/or tissue sampling. 2. There is an expansile area of erosive appearing lucency in the LEFT posterior fourth rib with possible associated nondisplaced rib fracture. This is concerning for a pathologic fracture. 3. Mildly increased conspicuity of additional LEFT lower lobe pulmonary nodules. Recommend attention on additional imaging. Aortic Atherosclerosis (ICD10-I70.0) and Emphysema (ICD10-J43.9). Electronically Signed   By: Clancy Crimes M.D.   On: 04/16/2024 13:49   CT T-SPINE NO CHARGE Result Date:  04/16/2024 EXAM: CT THORACIC SPINE WITHOUT CONTRAST 04/16/2024 01:05:09 PM TECHNIQUE: CT of the thoracic spine was performed without the administration of intravenous contrast. Multiplanar reformatted images are provided for review. Automated exposure control, iterative reconstruction, and/or weight based adjustment of the mA/kV was utilized to reduce the radiation dose to as low as reasonably achievable. COMPARISON: None available. CLINICAL HISTORY: Thoracic radiculopathy, infection suspected, no prior imaging; mid t spine tenderness insidous pain - evaluation for occult fx vs bony met. FINDINGS: BONES AND ALIGNMENT: Small, rib-like projections at the L1 vertebral level. No acute fracture or suspicious bone lesion. Normal alignment. DEGENERATIVE CHANGES: Disc height loss and moderate bilateral facet arthropathy at T11-12 result in moderate-to-severe bilateral neural foraminal narrowing. SOFT TISSUES: No acute abnormality. IMPRESSION: 1. No acute fracture or suspicious bone lesion. 2. Disc height loss and moderate bilateral facet arthropathy at T11-12 result in moderate-to-severe bilateral neural foraminal narrowing. Electronically signed by: Audra Blend MD 04/16/2024 01:24 PM EDT RP Workstation: [id number]   DG Chest 2 View Result Date: 04/16/2024 CLINICAL DATA:  Chest pain and shortness of breath. EXAM: CHEST - 2 VIEW COMPARISON:  03/26/2024 FINDINGS: The lungs are clear without focal pneumonia, edema, pneumothorax or pleural effusion. The cardiopericardial silhouette is within normal limits for size. No acute bony abnormality. IMPRESSION: No active cardiopulmonary disease. Electronically Signed   By: Donnal Fusi M.D.   On: 04/16/2024 11:42    Procedures Procedures    Medications Ordered in ED Medications  cefTRIAXone  (ROCEPHIN ) 1 g in sodium chloride  0.9 % 100 mL IVPB (0 g Intravenous Stopped 04/16/24 1456)  doxycycline  (VIBRA -TABS) tablet 100 mg (100 mg Oral Given 04/16/24 1417)  oxyCODONE  (Oxy  IR/ROXICODONE ) immediate release tablet 5 mg (5 mg Oral Given 04/16/24 1458)    ED Course/ Medical Decision Making/ A&P                                 Medical Decision Making Amount and/or Complexity of Data Reviewed Labs: ordered. Radiology: ordered.  Risk OTC drugs. Prescription drug management.   This patient presents to the Emergency Department with complaint of chest pain. This involves an extensive number of treatment options, and is a complaint that carries with it a high risk of complications and morbidity, given the patient's comorbidity, including hypertension.The differential diagnosis includes ACS vs Pneumothorax vs Reflux/Gastritis vs MSK pain vs Pneumonia vs other.  I felt PE was less likely given that no acute risk factors  I ordered, reviewed, and interpreted labs.  Pertinent results include no emergent findings.  Patient has some mild increase of her creatinine. I ordered medication oxycodone , Rocephin  and doxycycline  for chest pain and pneumonia I ordered imaging studies which included CT  of the chest and T-spine, x-ray of the chest I independently visualized and interpreted imaging which showed potential mass, infection, concern for malignancy, likely malignant rib fracture left posterior and the monitor tracing which showed sinus rhythm. I agree with the radiologist interpretation Additional history was obtained from patient's family I personally reviewed the patients ECG which showed sinus rhythm with no acute ischemic findings  After the interventions stated above, I reevaluated the patient and found that they were symptomatically improved  Patient will need to follow-up with an oncologist as an outpatient as there is concern for malignancy as a cause of both his chest pain and shoulder pain.  I thought it was reasonable to treat with antibiotics as well if there is a superimposed infection.  The patient and his daughter verbalized understanding.  He is stable for  discharge.  His rib fracture, in fact, may have happened about 4 months ago when he fell and since then has had posterior left shoulder pain.          Final Clinical Impression(s) / ED Diagnoses Final diagnoses:  Closed fracture of one rib of left side, initial encounter  Lung infection  Pulmonary mass    Rx / DC Orders ED Discharge Orders          Ordered    Ambulatory referral to Hematology / Oncology        04/16/24 1455    oxyCODONE  10 MG TABS  Every 8 hours PRN        04/16/24 1456    doxycycline  (VIBRAMYCIN ) 100 MG capsule  2 times daily        04/16/24 1457    senna-docusate (SENOKOT-S) 8.6-50 MG tablet  Daily PRN        04/16/24 1457              Arvilla Birmingham, MD 04/16/24 1627

## 2024-04-16 NOTE — ED Notes (Signed)
 MD at bedside.

## 2024-04-16 NOTE — ED Triage Notes (Signed)
 Pt c.o pain across his chest and sob for the past week. Pt was seen at [facility] last week and his PCP but states no one can find out what is wrong.

## 2024-04-16 NOTE — ED Notes (Signed)
 Pt to CT

## 2024-04-16 NOTE — ED Notes (Signed)
 Pt d/c home with daughter per EDP order. Discharge summary reviewed, pt verbalizes understanding. NAD.

## 2024-04-16 NOTE — Discharge Instructions (Addendum)
 Your CT scan showed that you have a broken rib on the back left side of your chest, and also a mass on the bottom of your chest.  This mass may be a cancer or another serious medical problem that needs close follow-up.  I placed a referral to an oncology clinic, who are cancer specialist.  Their scheduling office should contact you within 3 business days to set up this appointment.  In the meantime I prescribed a stronger pain medicine called oxycodone .  Take this only as needed.  This medicine can cause constipation and so I prescribed Peri-Colace which is a laxative stool softener to help prevent constipation while you are taking it.  Do not mix oxycodone  with any other sedating medicine.  Do not drink alcohol or drive after taking this.

## 2024-04-18 ENCOUNTER — Encounter: Payer: Self-pay | Admitting: Medical Oncology

## 2024-04-19 ENCOUNTER — Ambulatory Visit: Admitting: Family Medicine

## 2024-04-20 ENCOUNTER — Encounter: Payer: Self-pay | Admitting: Physician Assistant

## 2024-04-20 ENCOUNTER — Inpatient Hospital Stay: Attending: Physician Assistant | Admitting: Physician Assistant

## 2024-04-20 ENCOUNTER — Inpatient Hospital Stay

## 2024-04-20 ENCOUNTER — Encounter: Payer: Self-pay | Admitting: Medical Oncology

## 2024-04-20 VITALS — BP 147/55 | HR 60 | Temp 97.9°F | Resp 18 | Ht 72.0 in | Wt 195.3 lb

## 2024-04-20 DIAGNOSIS — R918 Other nonspecific abnormal finding of lung field: Secondary | ICD-10-CM

## 2024-04-20 DIAGNOSIS — Z87891 Personal history of nicotine dependence: Secondary | ICD-10-CM | POA: Insufficient documentation

## 2024-04-20 DIAGNOSIS — M899 Disorder of bone, unspecified: Secondary | ICD-10-CM

## 2024-04-20 DIAGNOSIS — Z79899 Other long term (current) drug therapy: Secondary | ICD-10-CM | POA: Diagnosis not present

## 2024-04-20 DIAGNOSIS — C7951 Secondary malignant neoplasm of bone: Secondary | ICD-10-CM | POA: Diagnosis not present

## 2024-04-20 DIAGNOSIS — Z8546 Personal history of malignant neoplasm of prostate: Secondary | ICD-10-CM | POA: Diagnosis not present

## 2024-04-20 DIAGNOSIS — C61 Malignant neoplasm of prostate: Secondary | ICD-10-CM | POA: Diagnosis not present

## 2024-04-20 DIAGNOSIS — I10 Essential (primary) hypertension: Secondary | ICD-10-CM | POA: Diagnosis not present

## 2024-04-20 LAB — CMP (CANCER CENTER ONLY)
ALT: 20 U/L (ref 0–44)
AST: 18 U/L (ref 15–41)
Albumin: 4.1 g/dL (ref 3.5–5.0)
Alkaline Phosphatase: 106 U/L (ref 38–126)
Anion gap: 7 (ref 5–15)
BUN: 33 mg/dL — ABNORMAL HIGH (ref 8–23)
CO2: 32 mmol/L (ref 22–32)
Calcium: 9.6 mg/dL (ref 8.9–10.3)
Chloride: 104 mmol/L (ref 98–111)
Creatinine: 2.25 mg/dL — ABNORMAL HIGH (ref 0.61–1.24)
GFR, Estimated: 28 mL/min — ABNORMAL LOW (ref >60)
Glucose, Bld: 105 mg/dL — ABNORMAL HIGH (ref 70–99)
Potassium: 4.3 mmol/L (ref 3.5–5.1)
Sodium: 143 mmol/L (ref 135–145)
Total Bilirubin: 0.8 mg/dL (ref 0.0–1.2)
Total Protein: 7.6 g/dL (ref 6.5–8.1)

## 2024-04-20 LAB — CBC WITH DIFFERENTIAL (CANCER CENTER ONLY)
Abs Immature Granulocytes: 0.22 10*3/uL — ABNORMAL HIGH (ref 0.00–0.07)
Basophils Absolute: 0.1 10*3/uL (ref 0.0–0.1)
Basophils Relative: 1 %
Eosinophils Absolute: 0.2 10*3/uL (ref 0.0–0.5)
Eosinophils Relative: 2 %
HCT: 41.9 % (ref 39.0–52.0)
Hemoglobin: 13.6 g/dL (ref 13.0–17.0)
Immature Granulocytes: 2 %
Lymphocytes Relative: 18 %
Lymphs Abs: 1.8 10*3/uL (ref 0.7–4.0)
MCH: 30.2 pg (ref 26.0–34.0)
MCHC: 32.5 g/dL (ref 30.0–36.0)
MCV: 92.9 fL (ref 80.0–100.0)
Monocytes Absolute: 0.8 10*3/uL (ref 0.1–1.0)
Monocytes Relative: 8 %
Neutro Abs: 7.1 10*3/uL (ref 1.7–7.7)
Neutrophils Relative %: 69 %
Platelet Count: 270 10*3/uL (ref 150–400)
RBC: 4.51 MIL/uL (ref 4.22–5.81)
RDW: 15.2 % (ref 11.5–15.5)
WBC Count: 10.2 10*3/uL (ref 4.0–10.5)
nRBC: 0 % (ref 0.0–0.2)

## 2024-04-20 NOTE — Progress Notes (Signed)
 [facility]  Patient presented to clinic, with his two daughters, for his scheduled appointment with Wyline Hearing, PA-C. I introduced myself and provided them with my direct contact information. Patient and daughter's were encouraged to call me with any questions/concerns they may have.  Esperanza Hedges, RN, BSN, [facility] Oncology Nurse Navigator, [facility] 04/20/2024 4:27 PM

## 2024-04-20 NOTE — Progress Notes (Signed)
 [facility] Telephone:([phone]) [phone]   Fax:([phone]) [phone]  INITIAL CONSULTATION:  Patient Care Team: Roise Cleaver, MD as PCP - General (Family Medicine) Amanda Jungling, Joyceann No, MD as PCP - Cardiology (Cardiology) Tami Falcon, MD as Consulting Physician (Gastroenterology) Yvonna Herder, MD as Consulting Physician (Neurosurgery) Homero Luster, MD as Attending Physician (Urology) Amedeo Jupiter, MD as Consulting Physician (Ophthalmology) Homero Luster, MD as Attending Physician (Urology) Annella Barrows, RN as Oncology Nurse Navigator (Medical Oncology)  CHIEF COMPLAINTS/PURPOSE OF CONSULTATION:  -Left lower lobe soft tissue mass -Left 4th ribe pathologic fracture  ONCOLOGIC HISTORY: 04/16/2024: Presented to ER due to chest pain and back pain x 1 month CT chest: There is increased masslike soft tissue density within an area of paraseptal emphysema in the LEFT lower lobe. This is concerning for primary lung neoplasm although superimposed infection is in the differential.  There is an expansile area of erosive appearing lucency in the LEFT posterior fourth rib with possible associated nondisplaced rib fracture. This is concerning for a pathologic fracture. Mildly increased conspicuity of additional LEFT lower lobe pulmonary nodules. CT T Spine-no concerning bone lesions 04/20/2024: Establish care with [facility]  HISTORY OF PRESENTING ILLNESS:  Joshua Compton. 83 y.o. male with medical history significant for stroke, heart failure, prostate cancer s/p seed implantation, hyperlipidemia, hypertension who presents to the rapid diagnostic clinic for evaluation of abnormal CT chest concerning for malignancy. He is accompanied by his daughters for this visit.   On exam today, Mr. Joshua Compton reports that he has noticed mild fatigue but is overall able to complete his baseline ADLs on his own. He denies any appetite or weight changes. He  denies nausea, vomiting or bowel habit changes. He continues to have pain in his across his lower chest that is worse with deep inspiration. He reports the pain started 4 weeks ago and has been persistent. He denies easy bruising or signs of bleeding. He has a chronic cough that is usually dry with occasional phlegm. He denies fevers, chills,sweats, shortness of breath, headaches or dizziness. He has no other complaints. Rest of the 10 point ROS is below.   MEDICAL HISTORY:  Past Medical History:  Diagnosis Date   Acute heart failure with preserved ejection fraction (HFpEF) (HCC) 09/05/2023   Acute HFrEF (heart failure with reduced ejection fraction) (HCC) 09/08/2023   Acute respiratory failure with hypoxia (HCC) 09/05/2023   Arthritis    Cancer (HCC)    prostate ca   seed implants   GERD (gastroesophageal reflux disease)    Glaucoma    Hyperlipidemia    Hypertension    dr Margery Sheets     [place]   fm   Stroke [facility]) 07/2023    SURGICAL HISTORY: Past Surgical History:  Procedure Laterality Date   BACK SURGERY     x2   COLONOSCOPY     HERNIA REPAIR  11-06-2009   left inguinal repair    NECK SURGERY     ROTATOR CUFF REPAIR Left jan 2016   TOTAL HIP ARTHROPLASTY Right 04/20/2020   Procedure: RIGHT TOTAL HIP ARTHROPLASTY ANTERIOR APPROACH;  Surgeon: Wes Hamman, MD;  Location: [facility] OR;  Service: Orthopedics;  Laterality: Right;   TOTAL HIP ARTHROPLASTY Left 01/07/2021   Procedure: LEFT TOTAL HIP ARTHROPLASTY ANTERIOR APPROACH;  Surgeon: Wes Hamman, MD;  Location: [facility] OR;  Service: Orthopedics;  Laterality: Left;   TRANSURETHRAL RESECTION OF PROSTATE      SOCIAL HISTORY: Social History  Socioeconomic History   Marital status: Married    Spouse name: Not on file   Number of children: 5   Years of education: 64   Highest education level: 12th grade  Occupational History   Occupation: Nutritional therapist    Comment: retired  Tobacco Use   Smoking status: Former    Current  packs/day: 0.00    Average packs/day: 0.5 packs/day for 30.0 years (15.0 ttl pk-yrs)    Types: Cigarettes    Start date: 03/29/1975    Quit date: 03/28/2005    Years since quitting: 19.1   Smokeless tobacco: Never  Vaping Use   Vaping status: Never Used  Substance and Sexual Activity   Alcohol use: No    Alcohol/week: 0.0 standard drinks of alcohol   Drug use: No   Sexual activity: Yes  Other Topics Concern   Not on file  Social History Narrative   Grandson lives with them - no stairs   Social Drivers of Corporate investment banker Strain: Low Risk  (02/23/2024)   Overall Financial Resource Strain (CARDIA)    Difficulty of Paying Living Expenses: Not hard at all  Food Insecurity: No Food Insecurity (02/23/2024)   Hunger Vital Sign    Worried About Running Out of Food in the Last Year: Never true    Ran Out of Food in the Last Year: Never true  Transportation Needs: No Transportation Needs (02/23/2024)   PRAPARE - Administrator, Civil Service (Medical): No    Lack of Transportation (Non-Medical): No  Physical Activity: Unknown (02/23/2024)   Exercise Vital Sign    Days of Exercise per Week: 0 days    Minutes of Exercise per Session: Not on file  Stress: No Stress Concern Present (02/23/2024)   [employer] of Occupational Health - Occupational Stress Questionnaire    Feeling of Stress : Not at all  Social Connections: Moderately Integrated (02/23/2024)   Social Connection and Isolation Panel    Frequency of Communication with Friends and Family: More than three times a week    Frequency of Social Gatherings with Friends and Family: Twice a week    Attends Religious Services: More than 4 times per year    Active Member of [employer] or Organizations: No    Attends Engineer, structural: Not on file    Marital Status: Married  Catering manager Violence: Not At Risk (09/05/2023)   Humiliation, Afraid, Rape, and Kick questionnaire    Fear of Current or Ex-Partner: No     Emotionally Abused: No    Physically Abused: No    Sexually Abused: No    FAMILY HISTORY: Family History  Problem Relation Age of Onset   Cancer Mother        breast   Heart disease Father    Cancer Brother    Cancer Brother     ALLERGIES:  is allergic to iodinated contrast media, multihance  [gadobenate], pravastatin , and pravastatin  sodium.  MEDICATIONS:  Current Outpatient Medications  Medication Sig Dispense Refill   acetaminophen  (TYLENOL ) 500 MG tablet Take 1,000 mg by mouth every 6 (six) hours as needed for mild pain (pain score 1-3).     amiodarone  (PACERONE ) 200 MG tablet Take 1 tablet (200 mg total) by mouth daily. 90 tablet 3   apixaban  (ELIQUIS ) 2.5 MG TABS tablet Take 1 tablet (2.5 mg total) by mouth 2 (two) times daily. 60 tablet 11   atorvastatin  (LIPITOR) 40 MG tablet Take 1 tablet (40 mg  total) by mouth daily. 90 tablet 1   cholecalciferol (VITAMIN D3) 25 MCG (1000 UNIT) tablet TAKE 1 TABLET BY MOUTH EVERY DAY 90 tablet 1   dapagliflozin  propanediol (FARXIGA ) 10 MG TABS tablet Take 1 tablet (10 mg total) by mouth daily. 90 tablet 3   dorzolamide -timolol  (COSOPT ) 22.3-6.8 MG/ML ophthalmic solution Place 1 drop into both eyes daily.      fluticasone  (FLONASE ) 50 MCG/ACT nasal spray SPRAY 2 SPRAYS INTO EACH NOSTRIL EVERY DAY (Patient taking differently: Place 2 sprays into both nostrils daily as needed for allergies.) 48 mL 2   metoprolol  succinate (TOPROL -XL) 25 MG 24 hr tablet TAKE 0.5 TABLETS (12.5 MG TOTAL) BY MOUTH DAILY. FOR BLOOD PRESSURE CONTROL 45 tablet 1   oxyCODONE  10 MG TABS Take 1 tablet (10 mg total) by mouth every 8 (eight) hours as needed for up to 15 doses for severe pain (pain score 7-10). 15 tablet 0   pantoprazole  (PROTONIX ) 40 MG tablet Take 1 tablet (40 mg total) by mouth daily. For heartburn 90 tablet 3   potassium chloride  SA (KLOR-CON  M) 20 MEQ tablet TAKE 1 TABLET (20 MEQ TOTAL) BY MOUTH DAILY. AS A POTASSIUM SUPPLEMENT 90 tablet 1    rOPINIRole  (REQUIP ) 1 MG tablet Take 1 tablet (1 mg total) by mouth 3 (three) times daily. Plus an extra tablet at bedtime to total four a day 360 tablet 3   sacubitril -valsartan  (ENTRESTO ) 24-26 MG Take 1 tablet by mouth 2 (two) times daily. 60 tablet 3   senna-docusate (SENOKOT-S) 8.6-50 MG tablet Take 1 tablet by mouth daily as needed for mild constipation. 30 tablet 0   torsemide  (DEMADEX ) 20 MG tablet Take 3 tablets (60 mg total) by mouth daily.     Fluticasone -Umeclidin-Vilant (TRELEGY ELLIPTA ) 100-62.5-25 MCG/ACT AEPB Inhale 1 puff into the lungs daily. (Patient not taking: Reported on 04/27/2024) 28 each 5   predniSONE  (DELTASONE ) 10 MG tablet Take 5 daily for 3 days followed by 4,3,2 and 1 for 3 days each. (Patient not taking: Reported on 04/27/2024) 45 tablet 0   traMADol  (ULTRAM ) 50 MG tablet Take 1 every 6 hours for pain not relieved by Tylenol  alone 20 tablet 0   Current Facility-Administered Medications  Medication Dose Route Frequency Provider Last Rate Last Admin   betamethasone  acetate-betamethasone  sodium phosphate  (CELESTONE ) injection 6 mg  6 mg Intramuscular Once         REVIEW OF SYSTEMS:   Constitutional: ( - ) fevers, ( - )  chills , ( - ) night sweats Eyes: ( - ) blurriness of vision, ( - ) double vision, ( - ) watery eyes Ears, nose, mouth, throat, and face: ( - ) mucositis, ( - ) sore throat Respiratory: ( - ) cough, ( - ) dyspnea, ( - ) wheezes Cardiovascular: ( - ) palpitation, ( +) chest discomfort, ( - ) lower extremity swelling Gastrointestinal:  ( - ) nausea, ( - ) heartburn, ( - ) change in bowel habits Skin: ( - ) abnormal skin rashes Lymphatics: ( - ) new lymphadenopathy, ( - ) easy bruising Neurological: ( - ) numbness, ( - ) tingling, ( - ) new weaknesses Behavioral/Psych: ( - ) mood change, ( - ) new changes  All other systems were reviewed with the patient and are negative.  PHYSICAL EXAMINATION: ECOG PERFORMANCE STATUS: 1 - Symptomatic but completely  ambulatory  Vitals:   04/20/24 1407 04/20/24 1409  BP: (!) 143/65 (!) 147/55  Pulse: 60   Resp: 18  Temp: 97.9 F (36.6 C)   SpO2: 95%    Filed Weights   04/20/24 1407  Weight: 195 lb 4.8 oz (88.6 kg)    GENERAL: well appearing male in NAD  SKIN: skin color, texture, turgor are normal, no rashes or significant lesions EYES: conjunctiva are pink and non-injected, sclera clear OROPHARYNX: no exudate, no erythema; lips, buccal mucosa, and tongue normal  NECK: supple, non-tender LYMPH:  no palpable lymphadenopathy in the cervical, axillary or supraclavicular lymph nodes.  LUNGS: clear to auscultation and percussion with normal breathing effort HEART: regular rate & rhythm and no murmurs and no lower extremity edema ABDOMEN: soft, non-tender, non-distended, normal bowel sounds Musculoskeletal: no cyanosis of digits and no clubbing. Superficial wounds noted on anterior lower legs with gauze applied.  PSYCH: alert & oriented x 3, fluent speech NEURO: no focal motor/sensory deficits  LABORATORY DATA:  I have reviewed the data as listed    Latest Ref Rng & Units 04/20/2024    3:42 PM 04/16/2024   10:42 AM 04/12/2024   12:05 PM  CBC  WBC 4.0 - 10.5 K/uL 10.2  11.4  6.6   Hemoglobin 13.0 - 17.0 g/dL 11.9  14.7  82.9   Hematocrit 39.0 - 52.0 % 41.9  43.0  44.5   Platelets 150 - 400 K/uL 270  313  300        Latest Ref Rng & Units 04/20/2024    3:42 PM 04/16/2024   10:42 AM 04/12/2024   12:05 PM  CMP  Glucose 70 - 99 mg/dL 562  93  77   BUN 8 - 23 mg/dL 33  36  26   Creatinine 0.61 - 1.24 mg/dL 1.30  8.65  7.84   Sodium 135 - 145 mmol/L 143  140  144   Potassium 3.5 - 5.1 mmol/L 4.3  3.9  4.5   Chloride 98 - 111 mmol/L 104  103  102   CO2 22 - 32 mmol/L 32  25  24   Calcium  8.9 - 10.3 mg/dL 9.6  9.4  9.9   Total Protein 6.5 - 8.1 g/dL 7.6   7.6   Total Bilirubin 0.0 - 1.2 mg/dL 0.8   0.5   Alkaline Phos 38 - 126 U/L 106   141   AST 15 - 41 U/L 18   23   ALT 0 - 44 U/L 20   19       RADIOGRAPHIC STUDIES: I have personally reviewed the radiological images as listed and agreed with the findings in the report. NM PET Image Initial (PI) Skull Base To Thigh Result Date: 04/29/2024 CLINICAL DATA:  Initial treatment strategy for bone metastasis. EXAM: NUCLEAR MEDICINE PET SKULL BASE TO THIGH TECHNIQUE: 9.6 mCi F-18 FDG was injected intravenously. Full-ring PET imaging was performed from the skull base to thigh after the radiotracer. CT data was obtained and used for attenuation correction and anatomic localization. Fasting blood glucose: 86 mg/dl COMPARISON:  None Available. FINDINGS: NECK: Intense metabolic activity within the RIGHT thyroid  mass. Mass measures 4 cm has soft tissue attenuation centrally with intense metabolic activity (SUV max equal 45 (image 55. Incidental CT findings: None. CHEST: peripheral nodular thickening in the LEFT lower lobe measures 2.3 cm has mild metabolic activity for size with SUV max equal 4.4 on image 98. Single small hypermetabolic LEFT hilar lymph node with SUV max equal 5.5 on image 90. Incidental CT findings: Centrilobular emphysema. ABDOMEN/PELVIS: 1hypermetabolic focus along the abdominal wall musculature superior to  the RIGHT iliac bone with SUV max equal 8.1 on image 155 No abnormal metabolic activity in the liver. Adrenal glands are normal. Hypermetabolic muscular lesion lateral to the LEFT iliac bone on image 170 with SUV max equal 10.0. lesion resides in the gluteus muscle and subtly perceptible. Incidental CT findings: None. SKELETON: Multiple intensely hypermetabolic skeletal metastasis. Posterior expansile LEFT fourth rib lesion with SUV max equal 12.2. Anterior RIGHT lower rib lesion on image 63. Lesion adjacent the LEFT lateral clavicle is favored within the soft tissue muscles (image 57) Bone lesion in the LEFT iliac bone on image 68. Single muscular metastasis in the adductor muscles anterior to the LEFT inferior pubic ramus Incidental CT  findings: None. IMPRESSION: 1. Intensely hypermetabolic RIGHT thyroid  mass. Findings concerning for thyroid  cancer versus metastatic lesion. Favor primary thyroid  carcinoma. 2. Peripheral nodular thickening in the LEFT lower lobe with mild metabolic activity for size. Indeterminate lesion. Centrilobular emphysema noted. 3. Single hypermetabolic LEFT hilar lymph node. 4. Multiple hypermetabolic skeletal metastasis. Differential include thyroid  carcinoma metastasis versus lung cancer metastasis. Favor thyroid  carcinoma. 5. Multiple hypermetabolic muscular metastasis. The lesion in the LEFT gluteus muscle would be amenable to biopsy. 6.  Emphysema (ICD10-J43.9). Electronically Signed   By: Deboraha Fallow M.D.   On: 04/29/2024 18:01   CT Chest Wo Contrast Result Date: 04/16/2024 CLINICAL DATA:  Pneumonia, complication suspected, xray done EXAM: CT CHEST WITHOUT CONTRAST TECHNIQUE: Multidetector CT imaging of the chest was performed following the standard protocol without IV contrast. RADIATION DOSE REDUCTION: This exam was performed according to the departmental dose-optimization program which includes automated exposure control, adjustment of the mA and/or kV according to patient size and/or use of iterative reconstruction technique. COMPARISON:  September 05, 2023, June 19, 2021 FINDINGS: Cardiovascular: Heart is mildly enlarged. No pericardial effusion. Three-vessel coronary artery atherosclerotic calcifications. Atherosclerotic calcifications of the nonaneurysmal thoracic aorta. Mediastinum/Nodes: Similar appearance of the RIGHT thyroid , previously evaluated by ultrasound in 2022 a history of remote biopsy. No axillary or mediastinal adenopathy. Lungs/Pleura: No pleural effusion or pneumothorax. Diffuse bronchial thickening. Severe centrilobular and paraseptal emphysematous changes. In the LEFT lower lobe, peripherally there is increased soft tissue density within an area of paraseptal emphysema compared to  prior. This area spans 31 by 13 by 22 mm, previously 13 by 8 by 8 mm in representative cross-sectional area (series 7, image 229). It demonstrates a near rind like appearance and appears to immediately involve the pleural surface. (Series 4, image 127). Mildly increased conspicuity of a perifissural nodule measuring 7 by 3 mm (series 3, image 167; series 6, images 106). LEFT lower lobe linear nodule measures 11 by 3 mm, newly conspicuous (series 6, image 100). Upper Abdomen: No acute abnormality. Musculoskeletal: Gynecomastia. Degenerative changes of the thoracic spine. Expansile area of lucency in the LEFT posterior fourth rib with possible associated nondisplaced rib fracture. IMPRESSION: 1. There is increased masslike soft tissue density within an area of paraseptal emphysema in the LEFT lower lobe. This is concerning for primary lung neoplasm although superimposed infection is in the differential. Recommend consideration of dedicated PET/CT and/or tissue sampling. 2. There is an expansile area of erosive appearing lucency in the LEFT posterior fourth rib with possible associated nondisplaced rib fracture. This is concerning for a pathologic fracture. 3. Mildly increased conspicuity of additional LEFT lower lobe pulmonary nodules. Recommend attention on additional imaging. Aortic Atherosclerosis (ICD10-I70.0) and Emphysema (ICD10-J43.9). Electronically Signed   By: Clancy Crimes M.D.   On: 04/16/2024 13:49   CT T-SPINE  NO CHARGE Result Date: 04/16/2024 EXAM: CT THORACIC SPINE WITHOUT CONTRAST 04/16/2024 01:05:09 PM TECHNIQUE: CT of the thoracic spine was performed without the administration of intravenous contrast. Multiplanar reformatted images are provided for review. Automated exposure control, iterative reconstruction, and/or weight based adjustment of the mA/kV was utilized to reduce the radiation dose to as low as reasonably achievable. COMPARISON: None available. CLINICAL HISTORY: Thoracic  radiculopathy, infection suspected, no prior imaging; mid t spine tenderness insidous pain - evaluation for occult fx vs bony met. FINDINGS: BONES AND ALIGNMENT: Small, rib-like projections at the L1 vertebral level. No acute fracture or suspicious bone lesion. Normal alignment. DEGENERATIVE CHANGES: Disc height loss and moderate bilateral facet arthropathy at T11-12 result in moderate-to-severe bilateral neural foraminal narrowing. SOFT TISSUES: No acute abnormality. IMPRESSION: 1. No acute fracture or suspicious bone lesion. 2. Disc height loss and moderate bilateral facet arthropathy at T11-12 result in moderate-to-severe bilateral neural foraminal narrowing. Electronically signed by: Audra Blend MD 04/16/2024 01:24 PM EDT RP Workstation: [id number]   DG Chest 2 View Result Date: 04/16/2024 CLINICAL DATA:  Chest pain and shortness of breath. EXAM: CHEST - 2 VIEW COMPARISON:  03/26/2024 FINDINGS: The lungs are clear without focal pneumonia, edema, pneumothorax or pleural effusion. The cardiopericardial silhouette is within normal limits for size. No acute bony abnormality. IMPRESSION: No active cardiopulmonary disease. Electronically Signed   By: Donnal Fusi M.D.   On: 04/16/2024 11:42    ASSESSMENT & PLAN Joshua Compton. Is a 83 y.o. male who presents to the rapid diagnostic clinic for evaluation of left lung soft tissue mass and left rib fracture.   #LLL soft tissue mass #Left posterior 4th rib fracture --Seen on CT chest from 04/16/2024. --Rib fracture is concerning for pathologic fracture --Further evaluate with upcoming PET scan.  --Labs today to check CBC, CMP, PSA, SPEP/IFE and serum free light chains --Request CT biopsy of lung mass --RTC once workup is complete  #Prostate cancer: --S/p brachytherapy >10-15 years ago per patient. No records available for review.   #Age appropriate cancer screens: --Colonoscopy 2018 --Last PSA on record is from 07/2023,  <0.1    Orders  Placed This Encounter  Procedures   NM PET Image Initial (PI) Skull Base To Thigh    Standing Status:   Future    Number of Occurrences:   1    Expected Date:   04/21/2024    Expiration Date:   04/20/2025    If indicated for the ordered procedure, I authorize the administration of a radiopharmaceutical per Radiology protocol:   Yes    Preferred imaging location?:   [facility]   CT LUNG MASS BIOPSY    Standing Status:   Future    Expected Date:   04/27/2024    Expiration Date:   04/20/2025    Lab orders requested (DO NOT place separate lab orders, these will be automatically ordered during procedure specimen collection)::   Surgical Pathology    Reason for Exam (SYMPTOM  OR DIAGNOSIS REQUIRED):   left lung mass seen on CT from 5/31    Preferred location?:   [facility]   CBC with Differential (Cancer Center Only)    Standing Status:   Future    Number of Occurrences:   1    Expiration Date:   04/20/2025   CMP (Cancer Center only)    Standing Status:   Future    Number of Occurrences:   1    Expiration Date:   04/20/2025  Multiple Myeloma Panel (SPEP&IFE w/QIG)    Standing Status:   Future    Number of Occurrences:   1    Expiration Date:   04/20/2025   Kappa/lambda light chains    Standing Status:   Future    Number of Occurrences:   1    Expiration Date:   04/20/2025   Prostate-Specific AG, Serum    Standing Status:   Future    Number of Occurrences:   1    Expiration Date:   04/20/2025    All questions were answered. The patient knows to call the clinic with any problems, questions or concerns.  I have spent a total of 60 minutes minutes of face-to-face and non-face-to-face time, preparing to see the patient, obtaining and/or reviewing separately obtained history, performing a medically appropriate examination, counseling and educating the patient, ordering medications/tests/procedures, referring and communicating with other health care professionals, documenting clinical information  in the electronic health record, independently interpreting results and communicating results to the patient, and care coordination.   Wyline Hearing, PA-C Department of Hematology/Oncology [facility] at [facility] Phone: [phone]  Patient was see with Dr. Rosaline Coma   I have read the above note and personally examined the patient. I agree with the assessment and plan as noted above.  Briefly Mr. Joshua Compton is an 83 year old male who presents for evaluation of a pathological rib fracture and soft tissue mass in the left lower lobe of the lung.  The patient initially had imaging performed on 04/16/2024 in the emergency department due to chest pain and shortness of breath.  The CT scan showed a masslike soft tissue density in the area of the paraseptal emphysema in the left lower lobe concerning for primary lung neoplasm.  Additionally there is expansile area of erosive appearing lucency in the left posterior fourth rib.  Due to concern for these findings the patient was referred to our clinic for further evaluation and management.  At this time I would recommend performing a PET CT scan followed by a CT lung mass biopsy versus bronchoscopy with biopsy.  Findings at this time are highly suspicious for a primary lung neoplasm.  The patient does have a history of prostate cancer so we will order PSA to evaluate this further.  Additionally we will order multiple myeloma panel and kappa lambda light chain panel in the event that these 2 lesions are due to different etiologies.  The patient voiced understanding of our findings and plan moving forward.  In the event that the patient is found to have a primary lung malignancy we will transfer his care to our thoracic oncologist.   Rogerio Clay, MD Department of Hematology/Oncology [facility] at [facility] Phone: [phone] Pager: [phone] Email: [email or web address]

## 2024-04-21 ENCOUNTER — Telehealth: Payer: Self-pay

## 2024-04-21 LAB — KAPPA/LAMBDA LIGHT CHAINS
Kappa free light chain: 25.4 mg/L — ABNORMAL HIGH (ref 3.3–19.4)
Kappa, lambda light chain ratio: 1.23 (ref 0.26–1.65)
Lambda free light chains: 20.7 mg/L (ref 5.7–26.3)

## 2024-04-21 LAB — PROSTATE-SPECIFIC AG, SERUM (LABCORP): Prostate Specific Ag, Serum: <0.1 ng/mL (ref 0.0–4.0)

## 2024-04-21 NOTE — Progress Notes (Signed)
 Rosetta Cons, MD  Joelle Musca; P Ir Procedure Requests  PROCEDURE / BIOPSY REVIEW Date: 04/21/24  Requested Biopsy site: Left lung nodule Reason for request: lung nodules Imaging review: Best seen on CT chest  Decision: Approved Imaging modality to perform: CT Schedule with: Moderate Sedation Schedule for: Any VIR  Additional comments: @Schedulers .  Please contact me with questions, concerns, or if issue pertaining to this request arise.  Rosetta Cons, MD Vascular and Interventional Radiology Specialists [facility] Radiology       Previous Messages    ----- Message ----- From: Marticia Reifschneider Sent: 04/21/2024  11:17 AM EDT To: Rosetta Cons, MD; Joelle Musca; * Subject: CT Biopsy                                      Procedure : Ct Biopsy  Reason: left lung mass seen on CT from 5/31 Dx: Lung mass [R91.8 (ICD-10-CM)]    History : CT t spine , CT chest w/o , CT cervical spine w/o , CT Head w/o  Provider : Darilyn Edin, PA-C  Provider contact : [phone]

## 2024-04-21 NOTE — Telephone Encounter (Signed)
...     Pre-operative Risk Assessment    Patient Name: Joshua Compton.  DOB: Sep 03, 1941 MRN: [id number]   Date of last office visit: 02/25/24 Date of next office visit: 06/30/24   Request for Surgical Clearance    Procedure:  ct biopsy of the lung  Date of Surgery:  Clearance TBD                                Surgeon:  Audelia Blazer Surgeon's Group or Practice Name:  [facility] Phone number:  [phone] Fax number:  [phone]   Type of Clearance Requested:   - Medical  - Pharmacy:  Hold Apixaban  (Eliquis )     Type of Anesthesia:  moderate   Additional requests/questions:    Montel Antu   04/21/2024, 1:38 PM

## 2024-04-21 NOTE — Patient Instructions (Signed)
 Diagnostic Clinic Office Visit Discharge Information and Instructions  Thank you for choosing [facility] for your healthcare needs.  Below is a summary of today's discussion, along with our contact information and an outline of what to expect next.  Reason for Visit:  Left lower lobe soft tissue mass and possible pathologic fracture of left 4th rib.   Proposed Diagnostic Care Plan: Labs today PET/CT scan  CT guided biopsy of left lung mass.   What to Expect: - Generally, when lab tests are ordered the results can take up to 1 week for results to be available.  At that point, we will contact you to discuss your results with you.  Unless there is a critical result, we will typically wait for all of your lab results to be available before contacting you. - If a biopsy is part of your Care Plan, those results can take on average 7-10 days to result.  Once results are available, we will contact you to discuss your pathology results and any next steps. - If you have additional imaging ordered, such as a CT Scan, MRI, Ultrasound, Bone Scan, or PET scan, your imaging will need to be authorized then scheduled with the earliest available appointment.  You may be asked to travel to another hospital within [facility] who has a sooner availability, please consider doing so if asked. - If you use MyChart, your results will be available to you in the MyChart portal.  Your provider will be in touch with you as soon as all of your results are available to be discussed.  Your Diagnostic Clinic Provider:  Wyline Hearing PA-C and Dr. Amparo Balk, contact number [phone] Your Diagnostic Navigator:  Jetta Morrow, RN, contact number [phone]  If you or your caregiver have number blocking on your cell phones, please ensure the cancer center's numbers are not blocked.  If you are not a registered MyChart user, please consider enrolling in MyChart to receive your test results and visit notes.  You can also  access your discharge instructions electronically.  MyChart also gives you an electronic means to communicate with your Care Team instead of needing to call in to the cancer center.  We appreciate you trusting us  with your healthcare and look forward to partnering with you as we work to uncover what your potential diagnosis may be.  Please do not hesitate to reach out at any point with questions or concerns.

## 2024-04-22 LAB — MULTIPLE MYELOMA PANEL, SERUM
Albumin SerPl Elph-Mcnc: 3.4 g/dL (ref 2.9–4.4)
Albumin/Glob SerPl: 1 (ref 0.7–1.7)
Alpha 1: 0.3 g/dL (ref 0.0–0.4)
Alpha2 Glob SerPl Elph-Mcnc: 1 g/dL (ref 0.4–1.0)
B-Globulin SerPl Elph-Mcnc: 1.1 g/dL (ref 0.7–1.3)
Gamma Glob SerPl Elph-Mcnc: 1.2 g/dL (ref 0.4–1.8)
Globulin, Total: 3.6 g/dL (ref 2.2–3.9)
IgA: 361 mg/dL (ref 61–437)
IgG (Immunoglobin G), Serum: 1237 mg/dL (ref 603–1613)
IgM (Immunoglobulin M), Srm: 107 mg/dL (ref 15–143)
Total Protein ELP: 7 g/dL (ref 6.0–8.5)

## 2024-04-26 NOTE — Telephone Encounter (Signed)
 Patient with diagnosis of atrial fibrillation on Eliquis  for anticoagulation.    Procedure:  ct biopsy of the lung   Date of Surgery:  Clearance TBD    CHA2DS2-VASc Score = 7   This indicates a 11.2% annual risk of stroke. The patient's score is based upon: CHF History: 1 HTN History: 1 Diabetes History: 0 Stroke History: 2 Vascular Disease History: 1 Age Score: 2 Gender Score: 0   Pt had cardioembolic stroke 07/2023  CrCl 31 Platelet count 270  Patient has not had an Afib/aflutter ablation within the last 3 months or DCCV within the last 30 days  Would recommend 3 day hold because of low CrCl, without bridge, but because CVA was just 9 months ago, will review with primary cardiologist.    **This guidance is not considered finalized until pre-operative APP has relayed final recommendations.**

## 2024-04-27 ENCOUNTER — Encounter: Payer: Self-pay | Admitting: Nurse Practitioner

## 2024-04-27 ENCOUNTER — Ambulatory Visit: Attending: Family Medicine | Admitting: Nurse Practitioner

## 2024-04-27 ENCOUNTER — Telehealth: Payer: Self-pay | Admitting: *Deleted

## 2024-04-27 DIAGNOSIS — Z01810 Encounter for preprocedural cardiovascular examination: Secondary | ICD-10-CM | POA: Diagnosis not present

## 2024-04-27 NOTE — Telephone Encounter (Signed)
 Surgery center is calling today about clearance. I will send notes to requesting office the pt needs a tele preop appt today.   S/w the pt and he has been added on today at 1 pm. Med rec and consent are done.   I did ask pt if he took his Eliquis  today, he said yes about 8 or 8:30 AM. This will now only give a 2 day hold for the pt for his Eliquis .

## 2024-04-27 NOTE — Telephone Encounter (Signed)
 Surgery center is calling today about clearance. I will send notes to requesting office the pt needs a tele preop appt today.   S/w the pt and he has been added on today at 1 pm. Med rec and consent are done.   I did ask pt if he took his Eliquis  today, he said yes about 8 or 8:30 AM. This will now only give a 2 day hold for the pt for his Eliquis .      Patient Consent for Virtual Visit        Joshua Compton. has provided verbal consent on 04/27/2024 for a virtual visit (video or telephone).   CONSENT FOR VIRTUAL VISIT FOR:  [name].  By participating in this virtual visit I agree to the following:  I hereby voluntarily request, consent and authorize [facility] and its employed or contracted physicians, physician assistants, nurse practitioners or other licensed health care professionals (the Practitioner), to provide me with telemedicine health care services (the "Services) as deemed necessary by the treating Practitioner. I acknowledge and consent to receive the Services by the Practitioner via telemedicine. I understand that the telemedicine visit will involve communicating with the Practitioner through live audiovisual communication technology and the disclosure of certain medical information by electronic transmission. I acknowledge that I have been given the opportunity to request an in-person assessment or other available alternative prior to the telemedicine visit and am voluntarily participating in the telemedicine visit.  I understand that I have the right to withhold or withdraw my consent to the use of telemedicine in the course of my care at any time, without affecting my right to future care or treatment, and that the Practitioner or I may terminate the telemedicine visit at any time. I understand that I have the right to inspect all information obtained and/or recorded in the course of the telemedicine visit and may receive copies of available  information for a reasonable fee.  I understand that some of the potential risks of receiving the Services via telemedicine include:  Delay or interruption in medical evaluation due to technological equipment failure or disruption; Information transmitted may not be sufficient (e.g. poor resolution of images) to allow for appropriate medical decision making by the Practitioner; and/or  In rare instances, security protocols could fail, causing a breach of personal health information.  Furthermore, I acknowledge that it is my responsibility to provide information about my medical history, conditions and care that is complete and accurate to the best of my ability. I acknowledge that Practitioner's advice, recommendations, and/or decision may be based on factors not within their control, such as incomplete or inaccurate data provided by me or distortions of diagnostic images or specimens that may result from electronic transmissions. I understand that the practice of medicine is not an exact science and that Practitioner makes no warranties or guarantees regarding treatment outcomes. I acknowledge that a copy of this consent can be made available to me via my patient portal [facility] MyChart), or I can request a printed copy by calling the office of [facility].    I understand that my insurance will be billed for this visit.   I have read or had this consent read to me. I understand the contents of this consent, which adequately explains the benefits and risks of the Services being provided via telemedicine.  I have been provided ample opportunity to ask questions regarding this consent and the Services and have had my questions answered to  my satisfaction. I give my informed consent for the services to be provided through the use of telemedicine in my medical care

## 2024-04-27 NOTE — Progress Notes (Signed)
 Virtual Visit via Telephone Note   Because of Joshua Compton. co-morbid illnesses, he is at least at moderate risk for complications without adequate follow up.  This format is felt to be most appropriate for this patient at this time.  Due to technical limitations with video connection (technology), today's appointment will be conducted as an audio only telehealth visit, and [name]. verbally agreed to proceed in this manner.   All issues noted in this document were discussed and addressed.  No physical exam could be performed with this format.  Evaluation Performed:  Preoperative cardiovascular risk assessment _____________   Date:  04/27/2024   Patient ID:  Joshua Compton., DOB [birth year], MRN [id number] Patient Location:  Home Provider location:   Office  Primary Care Provider:  Roise Cleaver, MD Primary Cardiologist:  Armida Lander, MD  Chief Complaint / Patient Profile   83 y.o. y/o male with a h/o PAF on chronic anticoagulation, CVA, HFmrEF, HLD, HTN who is pending CT lung biopsy with [facility] Radiology on date TBD and presents today for telephonic preoperative cardiovascular risk assessment.  History of Present Illness    Joshua Compton. is a 83 y.o. male who presents via audio/video conferencing for a telehealth visit today.  Pt was last seen in cardiology clinic on 02/25/24 by Dr. Amanda Jungling.  At that time Salbador Fiveash. was doing well.  The patient is now pending procedure as outlined above. Since his last visit, he denies shortness of breath, lower extremity edema, fatigue, palpitations, melena, hematuria, hemoptysis, diaphoresis, weakness, presyncope, syncope, orthopnea, and PND. He reports chest discomfort particularly when he has his arms raised over his head. The pain does not worsen with exertion and is not accompanied by additional symptoms of n/v, diaphoresis or shortness of breath. It is relieved with Tylenol . He is able to achieve  > 4 METS activity without concerning cardiac symptoms.    Past Medical History    Past Medical History:  Diagnosis Date   Acute heart failure with preserved ejection fraction (HFpEF) (HCC) 09/05/2023   Acute HFrEF (heart failure with reduced ejection fraction) (HCC) 09/08/2023   Acute respiratory failure with hypoxia (HCC) 09/05/2023   Arthritis    Cancer (HCC)    prostate ca   seed implants   GERD (gastroesophageal reflux disease)    Glaucoma    Hyperlipidemia    Hypertension    dr Margery Sheets     [place]   fm   Stroke [facility]) 07/2023   Past Surgical History:  Procedure Laterality Date   BACK SURGERY     x2   COLONOSCOPY     HERNIA REPAIR  11-06-2009   left inguinal repair    NECK SURGERY     ROTATOR CUFF REPAIR Left jan 2016   TOTAL HIP ARTHROPLASTY Right 04/20/2020   Procedure: RIGHT TOTAL HIP ARTHROPLASTY ANTERIOR APPROACH;  Surgeon: Wes Hamman, MD;  Location: [facility] OR;  Service: Orthopedics;  Laterality: Right;   TOTAL HIP ARTHROPLASTY Left 01/07/2021   Procedure: LEFT TOTAL HIP ARTHROPLASTY ANTERIOR APPROACH;  Surgeon: Wes Hamman, MD;  Location: [facility] OR;  Service: Orthopedics;  Laterality: Left;   TRANSURETHRAL RESECTION OF PROSTATE      Allergies  Allergies  Allergen Reactions   Iodinated Contrast Media Anaphylaxis    Tongue swelled up when he had MRI in [facility]  Intubated for airway protection after oral and throat swelling   Multihance  [Gadobenate] Swelling    Tongue swelled up when he had MRI  in [place]   Pravastatin  Other (See Comments)    Muscle aches   Pravastatin  Sodium     Muscle aches    Home Medications    Prior to Admission medications   Medication Sig Start Date End Date Taking? Authorizing Provider  acetaminophen  (TYLENOL ) 500 MG tablet Take 1,000 mg by mouth every 6 (six) hours as needed for mild pain (pain score 1-3).    [provider]  amiodarone  (PACERONE ) 200 MG tablet Take 1 tablet (200 mg total) by mouth daily. 02/25/24    Laurann Pollock, MD  apixaban  (ELIQUIS ) 2.5 MG TABS tablet Take 1 tablet (2.5 mg total) by mouth 2 (two) times daily. 02/25/24   Laurann Pollock, MD  atorvastatin  (LIPITOR) 40 MG tablet Take 1 tablet (40 mg total) by mouth daily. 02/18/24   Roise Cleaver, MD  cholecalciferol (VITAMIN D3) 25 MCG (1000 UNIT) tablet TAKE 1 TABLET BY MOUTH EVERY DAY 02/16/24   Roise Cleaver, MD  dapagliflozin  propanediol (FARXIGA ) 10 MG TABS tablet Take 1 tablet (10 mg total) by mouth daily. 11/30/23   Roise Cleaver, MD  dorzolamide -timolol  (COSOPT ) 22.3-6.8 MG/ML ophthalmic solution Place 1 drop into both eyes daily.  08/04/16   [provider]  fluticasone  (FLONASE ) 50 MCG/ACT nasal spray SPRAY 2 SPRAYS INTO EACH NOSTRIL EVERY DAY Patient taking differently: Place 2 sprays into both nostrils daily as needed for allergies. 09/29/23   Roise Cleaver, MD  Fluticasone -Umeclidin-Vilant (TRELEGY ELLIPTA ) 100-62.5-25 MCG/ACT AEPB Inhale 1 puff into the lungs daily. Patient not taking: Reported on 04/27/2024 01/22/24   Lind Repine, MD  metoprolol  succinate (TOPROL -XL) 25 MG 24 hr tablet TAKE 0.5 TABLETS (12.5 MG TOTAL) BY MOUTH DAILY. FOR BLOOD PRESSURE CONTROL 01/07/24   Laurann Pollock, MD  oxyCODONE  10 MG TABS Take 1 tablet (10 mg total) by mouth every 8 (eight) hours as needed for up to 15 doses for severe pain (pain score 7-10). 04/16/24   Arvilla Birmingham, MD  pantoprazole  (PROTONIX ) 40 MG tablet Take 1 tablet (40 mg total) by mouth daily. For heartburn 07/28/23   Roise Cleaver, MD  potassium chloride  SA (KLOR-CON  M) 20 MEQ tablet TAKE 1 TABLET (20 MEQ TOTAL) BY MOUTH DAILY. AS A POTASSIUM SUPPLEMENT 03/21/24   Roise Cleaver, MD  predniSONE  (DELTASONE ) 10 MG tablet Take 5 daily for 3 days followed by 4,3,2 and 1 for 3 days each. Patient not taking: Reported on 04/27/2024 04/12/24   Roise Cleaver, MD  rOPINIRole  (REQUIP ) 1 MG tablet Take 1 tablet (1 mg total) by mouth 3 (three) times daily. Plus an extra tablet  at bedtime to total four a day 11/30/23   Roise Cleaver, MD  sacubitril -valsartan  (ENTRESTO ) 24-26 MG Take 1 tablet by mouth 2 (two) times daily. 04/05/24   Roise Cleaver, MD  senna-docusate (SENOKOT-S) 8.6-50 MG tablet Take 1 tablet by mouth daily as needed for mild constipation. 04/16/24 05/16/24  Arvilla Birmingham, MD  torsemide  (DEMADEX ) 20 MG tablet Take 3 tablets (60 mg total) by mouth daily. 02/01/24   Roise Cleaver, MD  traMADol  (ULTRAM ) 50 MG tablet Take 1 every 6 hours for pain not relieved by Tylenol  alone 03/26/24   Cheyenne Cotta, MD    Physical Exam    Vital Signs:  Dylyn Mclaren. does not have vital signs available for review today.  Given telephonic nature of communication, physical exam is limited. AAOx3. NAD. Normal affect.  Speech and respirations are unlabored.  Accessory Clinical Findings    None  Assessment & Plan    1.  Preoperative Cardiovascular Risk Assessment: According to the Revised Cardiac Risk Index (RCRI), his Perioperative Risk of Major Cardiac Event is (%): 11. His Functional Capacity in METs is: 7.59 according to the Duke Activity Status Index (DASI). The patient is doing well from a cardiac perspective. Therefore, based on ACC/AHA guidelines, the patient would be at acceptable risk for the planned procedure without further cardiovascular testing.   The patient was advised that if he develops new symptoms prior to surgery to contact our office to arrange for a follow-up visit, and he verbalized understanding.  Per office protocol, patient may hold Eliquis  for 3 days. No Lovenox  bridging is required.   A copy of this note will be routed to requesting surgeon.  Time:   Today, I have spent 10 minutes with the patient with telehealth technology discussing medical history, symptoms, and management plan.    Gerldine Koch, NP-C  04/27/2024, 1:06 PM [address], [address] [place], [place] [address] Office [phone] Fax [phone]

## 2024-04-27 NOTE — Telephone Encounter (Signed)
 Follow Up:     Joshua Compton called. She needs to know about holding his Eliquis   today, asap please. His procedure is scheduled for Friday.

## 2024-04-27 NOTE — Telephone Encounter (Signed)
   Name: Joshua Compton.  DOB: [birth year]  MRN: [id number]  Primary Cardiologist: Armida Lander, MD   Preoperative team, please contact this patient and set up a phone call appointment for further preoperative risk assessment. Please obtain consent and complete medication review. Thank you for your help.  I confirm that guidance regarding antiplatelet and oral anticoagulation therapy has been completed and, if necessary, noted below.  Per office protocol, patient may hold Eliquis  for 3 days. No Lovenox  bridging is required.   I also confirmed the patient resides in the state of [place] . As per [facility] Medical Board telemedicine laws, the patient must reside in the state in which the provider is licensed.   Margrett Kalb, Leilani Punter, NP 04/27/2024, 7:40 AM [facility]

## 2024-04-29 ENCOUNTER — Encounter (HOSPITAL_COMMUNITY)
Admission: RE | Admit: 2024-04-29 | Discharge: 2024-04-29 | Disposition: A | Discharge status: Home / Self Care | Source: Ambulatory Visit | Attending: Physician Assistant

## 2024-04-29 ENCOUNTER — Encounter: Payer: Self-pay | Admitting: Medical Oncology

## 2024-04-29 DIAGNOSIS — R918 Other nonspecific abnormal finding of lung field: Secondary | ICD-10-CM | POA: Diagnosis not present

## 2024-04-29 DIAGNOSIS — M899 Disorder of bone, unspecified: Secondary | ICD-10-CM | POA: Insufficient documentation

## 2024-04-29 DIAGNOSIS — C7951 Secondary malignant neoplasm of bone: Secondary | ICD-10-CM | POA: Diagnosis not present

## 2024-04-29 LAB — GLUCOSE, CAPILLARY: Glucose-Capillary: 86 mg/dL (ref 70–99)

## 2024-04-29 MED ORDER — FLUDEOXYGLUCOSE F - 18 (FDG) INJECTION
9.6400 | Freq: Once | INTRAVENOUS | Status: AC
  Administered 2024-04-29: 9.64 via INTRAVENOUS

## 2024-04-29 NOTE — Progress Notes (Signed)
 Rapid Diagnostic Clinic: Late Entry  Daughter, Mrs. Sulema Endo, LVM (11:58 AM) inquiring about PET scan for today and any required prep for it. Mrs. Sulema Endo states she wasn't informed whether Mr. Hintz need to do anything to prep for his appointment at 3:30. Also wanted to confirm blood thinner hold for biopsy scheduled on the 19th of this month.   I called the imaging department to inquire of any prep that is required and was informed that patient should have not had anything to eat or drink, including gum, candy, or cough drops, six hours prior to appointment, but may have water  though. I asked if imaging can call the patient to confirm that patient has not so they know how to proceed.   I returned Mrs. Moore's call at 1:50 PM inquiring whether patient was able to have his PET scan today and was informed that he is. Mrs. Sulema Endo confirmed that she heard from the imaging department and confirmed patient was good to go.   Mrs. Sulema Endo stated she also wanted to clarify patient's Eliquis  hold for scheduled biopsy on the 19th. I informed her that per the [facility] office note in [facility] on June 11th , 2025, per office protocol, patient may hold Eliquis  for 3 days. Daughter knows that patient is to hold Eliquis  starting Monday the 16th, 17th and 18th, for biopsy procedure on the 19th. Daughter gave her verbal understanding and denied further questions at this time.  Daugther thanked and was encouraged to call with further questions/concerns.   Esperanza Hedges, RN, BSN, [facility] Oncology Nurse Navigator, Rapid Diagnostic Clinic 04/29/2024 3:02 PM

## 2024-05-02 ENCOUNTER — Telehealth: Payer: Self-pay

## 2024-05-02 ENCOUNTER — Ambulatory Visit: Payer: Self-pay | Admitting: Physician Assistant

## 2024-05-02 ENCOUNTER — Other Ambulatory Visit: Payer: Self-pay | Admitting: Physician Assistant

## 2024-05-02 ENCOUNTER — Other Ambulatory Visit: Payer: Self-pay

## 2024-05-02 ENCOUNTER — Telehealth: Payer: Self-pay | Admitting: Physician Assistant

## 2024-05-02 MED ORDER — OXYCODONE HCL 10 MG PO TABS: 10.0000 mg | ORAL_TABLET | Freq: Four times a day (QID) | ORAL | 0 refills | Status: DC | PRN

## 2024-05-02 NOTE — Telephone Encounter (Signed)
 Pt's daughter called in to the after hours nurse 6/14 at 1:27 PM stating pt is having chest pain from the mass in his chest.   Spoke with the daughter, he was prescribed oxycodone  10 mg when he was in the hospital on 5/31.  They did control his pain but now he is completely out.  Request sent to Wyline Hearing, PA to send in a refill to [facility] in [place]

## 2024-05-02 NOTE — Telephone Encounter (Signed)
 I called patient's daughter, Joshua Compton, to review the PET scan results of finalized.  Findings are concerning for metastatic disease involving the muscle, bones, left lower lobe and hilar lymphadenopathy.  In addition, there is hypermetabolic activity in the right thyroid  lobe.  Differentials include metastatic thyroid  cancer and lung cancer.  Patient is scheduled for a CT lung biopsy this Thursday I will confirm with the IR team if they will plan to biopsy the lung lesion as originally planned or if they plan to target another lesion based on the PET scan results.   The lab results showed normal PSA level and no evidence of monoclonal protein to suggest multiple myeloma.  We will wait for the biopsy results to determine final diagnosis.   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  She is requesting assistance to fill out FMLA paperwork.  I advised her to send the paperwork to our team and we will happily fill out any necessary documents.  Ms. Joshua Compton expressed understanding and satisfaction with the plan provided.

## 2024-05-03 ENCOUNTER — Ambulatory Visit

## 2024-05-04 ENCOUNTER — Telehealth: Payer: Self-pay | Admitting: Physician Assistant

## 2024-05-04 ENCOUNTER — Other Ambulatory Visit: Payer: Self-pay | Admitting: Physician Assistant

## 2024-05-04 DIAGNOSIS — E041 Nontoxic single thyroid nodule: Secondary | ICD-10-CM

## 2024-05-04 DIAGNOSIS — R1900 Intra-abdominal and pelvic swelling, mass and lump, unspecified site: Secondary | ICD-10-CM

## 2024-05-04 NOTE — Telephone Encounter (Signed)
 I called and spoke to patient's daughter, Mathis Som, to share that after IR review of recent PET scan, there will be a change in biopsy target to right thyroid  nodule and left gluteus muscle. Mathis Som expressed understanding of the plan provided.

## 2024-05-04 NOTE — Progress Notes (Signed)
 Patient for US  Thyroid , US  guided FNA RT thyroid  nodule bx, US  guided Core LT pelvic mass biopsy on Thurs 05/05/24, I called and spoke with the patient's daughter, Mathis Som on the phone and gave pre-procedure instructions. Mathis Som was made aware to have the patient here at 10a and check in at the [facility] [employer]. Mathis Som stated understanding.  Called 05/04/24

## 2024-05-05 ENCOUNTER — Ambulatory Visit
Admission: RE | Admit: 2024-05-05 | Discharge: 2024-05-05 | Disposition: A | Discharge status: Home / Self Care | Source: Ambulatory Visit | Attending: Physician Assistant | Admitting: Physician Assistant

## 2024-05-05 ENCOUNTER — Ambulatory Visit: Admission: RE | Admit: 2024-05-05 | Source: Ambulatory Visit

## 2024-05-05 ENCOUNTER — Ambulatory Visit
Admission: RE | Admit: 2024-05-05 | Discharge: 2024-05-05 | Disposition: A | Discharge status: Home / Self Care | Source: Ambulatory Visit | Attending: Physician Assistant

## 2024-05-05 ENCOUNTER — Ambulatory Visit

## 2024-05-05 VITALS — BP 129/71 | HR 63

## 2024-05-05 DIAGNOSIS — E041 Nontoxic single thyroid nodule: Secondary | ICD-10-CM

## 2024-05-05 DIAGNOSIS — C771 Secondary and unspecified malignant neoplasm of intrathoracic lymph nodes: Secondary | ICD-10-CM | POA: Diagnosis not present

## 2024-05-05 DIAGNOSIS — C7989 Secondary malignant neoplasm of other specified sites: Secondary | ICD-10-CM | POA: Diagnosis not present

## 2024-05-05 DIAGNOSIS — Z8546 Personal history of malignant neoplasm of prostate: Secondary | ICD-10-CM | POA: Diagnosis not present

## 2024-05-05 DIAGNOSIS — C73 Malignant neoplasm of thyroid gland: Secondary | ICD-10-CM | POA: Insufficient documentation

## 2024-05-05 DIAGNOSIS — R1900 Intra-abdominal and pelvic swelling, mass and lump, unspecified site: Secondary | ICD-10-CM

## 2024-05-05 DIAGNOSIS — C7951 Secondary malignant neoplasm of bone: Secondary | ICD-10-CM | POA: Diagnosis not present

## 2024-05-05 DIAGNOSIS — C801 Malignant (primary) neoplasm, unspecified: Secondary | ICD-10-CM | POA: Diagnosis not present

## 2024-05-05 MED ORDER — LIDOCAINE HCL (PF) 1 % IJ SOLN
2.0000 mL | Freq: Once | INTRAMUSCULAR | Status: AC
  Administered 2024-05-05: 2 mL via INTRADERMAL
  Filled 2024-05-05: qty 2

## 2024-05-05 MED ORDER — LIDOCAINE HCL (PF) 1 % IJ SOLN
10.0000 mL | Freq: Once | INTRAMUSCULAR | Status: AC
  Administered 2024-05-05: 10 mL
  Filled 2024-05-05: qty 10

## 2024-05-05 NOTE — Procedures (Signed)
 Vascular and Interventional Radiology Procedure Note  Patient: Joshua Compton. DOB: May 08, 1941 Medical Record Number: [id number] Note Date/Time: 05/05/24 11:35 AM   Performing Physician: Art Largo, MD Assistant(s): None  Diagnosis: L gluteus medius IM mass. HM on PET. Met DX w unknown primary   Procedure: LEFT PELVIC SUBCUTANEOUS MASS BIOPSY  Anesthesia: Local Anesthetic Complications: None Estimated Blood Loss: Minimal Specimens: Sent for Pathology  Findings:  Successful Ultrasound-guided biopsy of a L pelvic (gluteus medius IM) mass. A total of 3 samples were obtained. Hemostasis of the tract was achieved using Manual Pressure.  Plan: Bed rest for 0 hours.  See detailed procedure note with images in PACS. The patient tolerated the procedure well without incident or complication and was returned to Recovery in stable condition.    Art Largo, MD Vascular and Interventional Radiology Specialists [facility] Radiology   Pager. [phone] Clinic. [phone]

## 2024-05-05 NOTE — Procedures (Signed)
 PROCEDURE SUMMARY:  Using direct ultrasound guidance, 5 passes were made using 25 g needles into the nodule within the right lobe of the thyroid .   Ultrasound was used to confirm needle placements on all occasions.   EBL = trace  Specimens were sent to Pathology for analysis.  See procedure note under Imaging tab in Epic for full procedure details.  Electronically Signed: Mareo Portilla M Oneal Biglow, PA-C 05/05/2024, 11:37 AM

## 2024-05-05 NOTE — Discharge Instructions (Signed)
 Reviewed w/ pt

## 2024-05-06 LAB — CYTOLOGY - NON PAP

## 2024-05-06 LAB — SURGICAL PATHOLOGY

## 2024-05-09 ENCOUNTER — Telehealth: Payer: Self-pay | Admitting: Physician Assistant

## 2024-05-09 ENCOUNTER — Encounter: Payer: Self-pay | Admitting: Physician Assistant

## 2024-05-09 ENCOUNTER — Other Ambulatory Visit: Payer: Self-pay | Admitting: Family Medicine

## 2024-05-09 DIAGNOSIS — E041 Nontoxic single thyroid nodule: Secondary | ICD-10-CM

## 2024-05-09 DIAGNOSIS — I872 Venous insufficiency (chronic) (peripheral): Secondary | ICD-10-CM

## 2024-05-09 DIAGNOSIS — N1832 Chronic kidney disease, stage 3b: Secondary | ICD-10-CM

## 2024-05-09 NOTE — Telephone Encounter (Signed)
 I spoke with patient's daughter, Joshua Compton, to review the biopsy results. FNA of right thyroid  gland confirmed malignancy.  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 We need additional thyroid  tissue to confirm if this is primary thyroid  malignancy versus metastatic carcinoma.   Referral to Dr. Eletha will be placed today to expedite a referral for excisional biopsy of the thyroid  lesion.   Joshua Compton expressed understanding of the plan provided.

## 2024-05-10 DIAGNOSIS — L02416 Cutaneous abscess of left lower limb: Secondary | ICD-10-CM | POA: Diagnosis not present

## 2024-05-10 DIAGNOSIS — L02415 Cutaneous abscess of right lower limb: Secondary | ICD-10-CM | POA: Diagnosis not present

## 2024-05-10 DIAGNOSIS — L03116 Cellulitis of left lower limb: Secondary | ICD-10-CM | POA: Diagnosis not present

## 2024-05-10 DIAGNOSIS — L03115 Cellulitis of right lower limb: Secondary | ICD-10-CM | POA: Diagnosis not present

## 2024-05-11 ENCOUNTER — Other Ambulatory Visit: Payer: Self-pay | Admitting: Physician Assistant

## 2024-05-11 ENCOUNTER — Telehealth: Payer: Self-pay

## 2024-05-11 DIAGNOSIS — C799 Secondary malignant neoplasm of unspecified site: Secondary | ICD-10-CM

## 2024-05-11 NOTE — Telephone Encounter (Signed)
 Left message for pt to call back

## 2024-05-11 NOTE — Telephone Encounter (Signed)
 Pt daughter [name] made aware of providers recommendations. Pt was scheduled to see Dr. San tomorrow on 05/12/2024 at 2:20 PM. Natalie  made aware.  Address provided.  [name] verbalized understanding with all questions answered.

## 2024-05-12 ENCOUNTER — Encounter: Payer: Self-pay | Admitting: Gastroenterology

## 2024-05-12 ENCOUNTER — Telehealth: Payer: Self-pay

## 2024-05-12 ENCOUNTER — Encounter: Payer: Self-pay | Admitting: Physician Assistant

## 2024-05-12 ENCOUNTER — Ambulatory Visit: Admitting: Gastroenterology

## 2024-05-12 VITALS — BP 124/60 | HR 60 | Ht 72.0 in | Wt 191.0 lb

## 2024-05-12 DIAGNOSIS — Z7901 Long term (current) use of anticoagulants: Secondary | ICD-10-CM | POA: Diagnosis not present

## 2024-05-12 DIAGNOSIS — C73 Malignant neoplasm of thyroid gland: Secondary | ICD-10-CM

## 2024-05-12 DIAGNOSIS — I4891 Unspecified atrial fibrillation: Secondary | ICD-10-CM

## 2024-05-12 DIAGNOSIS — R948 Abnormal results of function studies of other organs and systems: Secondary | ICD-10-CM | POA: Diagnosis not present

## 2024-05-12 DIAGNOSIS — Z8673 Personal history of transient ischemic attack (TIA), and cerebral infarction without residual deficits: Secondary | ICD-10-CM | POA: Diagnosis not present

## 2024-05-12 DIAGNOSIS — I480 Paroxysmal atrial fibrillation: Secondary | ICD-10-CM

## 2024-05-12 DIAGNOSIS — I509 Heart failure, unspecified: Secondary | ICD-10-CM

## 2024-05-12 DIAGNOSIS — C799 Secondary malignant neoplasm of unspecified site: Secondary | ICD-10-CM

## 2024-05-12 MED ORDER — NA SULFATE-K SULFATE-MG SULF 17.5-3.13-1.6 GM/177ML PO SOLN: 1.0000 | ORAL | 0 refills | Status: DC

## 2024-05-12 NOTE — Telephone Encounter (Signed)
 [facility] Pre-operative Risk Assessment     Request for surgical clearance:     Endoscopy Procedure  What type of surgery is being performed?     Endo/Colon  When is this surgery scheduled?     05-18-24  What type of clearance is required ?   Pharmacy  Are there any medications that need to be held prior to surgery and how long? Eliquis  x 2 days  Practice name and name of physician performing surgery?      [facility]  What is your office phone and fax number?      Phone- [phone]  Fax- [phone]  Anesthesia type (None, local, MAC, general) ?       MAC   Please route your response to Nat SAUNDERS, CMA

## 2024-05-12 NOTE — Patient Instructions (Addendum)
 _______________________________________________________  If your blood pressure at your visit was 140/90 or greater, please contact your primary care physician to follow up on this. _______________________________________________________  If you are age 83 or older, your body mass index should be between 23-30. Your Body mass index is 25.9 kg/m. If this is out of the aforementioned range listed, please consider follow up with your Primary Care Provider. ________________________________________________________  The [facility] providers would like to encourage you to use MYCHART to communicate with providers for non-urgent requests or questions.  Due to long hold times on the telephone, sending your provider a message by [facility] may be a faster and more efficient way to get a response.  Please allow 48 business hours for a response.  Please remember that this is for non-urgent requests.  _______________________________________________________  Rosine have been scheduled for an endoscopy and colonoscopy. Please follow the written instructions given to you at your visit today.  If you use inhalers (even only as needed), please bring them with you on the day of your procedure.  DO NOT TAKE 7 DAYS PRIOR TO TEST- Trulicity (dulaglutide) Ozempic, Wegovy (semaglutide) Mounjaro (tirzepatide) Bydureon Bcise (exanatide extended release)  DO NOT TAKE 1 DAY PRIOR TO YOUR TEST Rybelsus (semaglutide) Adlyxin (lixisenatide) Victoza (liraglutide) Byetta (exanatide) ___________________________________________________________________________  Due to recent changes in healthcare laws, you may see the results of your imaging and laboratory studies on MyChart before your provider has had a chance to review them.  We understand that in some cases there may be results that are confusing or concerning to you. Not all laboratory results come back in the same time frame and the provider may be waiting for multiple  results in order to interpret others.  Please give us  48 hours in order for your provider to thoroughly review all the results before contacting the office for clarification of your results.    It was a pleasure to see you today!  Vito Cirigliano, D.O.

## 2024-05-12 NOTE — Progress Notes (Signed)
 Chief Complaint: Abnormal PET scan   Referring Provider:     Johnston Police, PA-C (Hematology/Oncology)    HPI:     Joshua Fyock. is a 83 y.o. male with a history of CVA 07/2023, Afib (now on Eliquis ), COPD, CHF 50-55%, prostate cancer s/p brachytherapy with seed implantation, HLD, HTN, CKD 3, referred to the Gastroenterology Clinic for evaluation of abnormal PET scan.  He was seen in the Hematology/Oncology clinic on 04/20/2024 for evaluation of abnormal CT chest as below.  - 04/16/2024: CT chest ordered in the ER due to chest pain/back pain x 1 month, notable for increased masslike soft tissue density within an area of paraseptal emphysema in the LEFT lower lobe. This is concerning for primary lung neoplasm although superimposed infection is in the differential. There is an expansile area of erosive appearing lucency in the LEFT posterior fourth rib with possible associated nondisplaced rib fracture. This is concerning for a pathologic fracture. Mildly increased conspicuity of additional LEFT lower lobe pulmonary nodules. - 04/16/2024: WBC 11.4, otherwise normal CBC.  BUN/creatinine 36/2.15 - 04/20/2024: Evaluation in [facility] for pathologic rib fracture and soft tissue mass in [facility].  Referred for PET scan, labs, CT biopsy of lung mass.  Normal CBC.  Normal liver enzymes, BUN/creatinine 33/2.25. - 04/29/2024: PET scan: Intensely hypermetabolic right thyroid  mass, peripheral nodular thickening in the left lower lobe with mild metabolic activity for size, single hypermetabolic left hilar lymph node, multiple hypermetabolic skeletal metastases.  Multiple hypermetabolic muscular metastases with lesion in left gluteus muscle amenable to biopsy.  Differential includes thyroid  carcinoma metastases versus lung cancer metastases, favoring thyroid  carcinoma.  Emphysema. - 05/05/2024: Thyroid  ultrasound: Solitary dominant 4.8 cm right inferior thyroid  nodule.  Nodule biopsy by  IR: Positive for malignancy - 05/05/2024: Left hip mass biopsy by IR: Metastatic adenocarcinoma, moderately differentiated  Was referred for urgent GI appointment for consideration of EGD/colonoscopy as the adenocarcinoma is suspicious for GI primary.  Also has referral in place for surgery for excisional biopsy of the thyroid  mass (unclear if this is thyroid  primary with metastases or 2 separate primary malignancies).  He is otherwise without any GI symptoms.  No nausea/vomiting, change in bowel habits, overt GI bleeding.  Weight stable, no early satiety.  Brother with cancer of unknown type. Mother with Breast CA.   Has had colonoscopy many years ago. Not sure of results, when, or where this was done. No prior EGD.    Past Medical History:  Diagnosis Date   Acute heart failure with preserved ejection fraction (HFpEF) (HCC) 09/05/2023   Acute HFrEF (heart failure with reduced ejection fraction) (HCC) 09/08/2023   Acute respiratory failure with hypoxia (HCC) 09/05/2023   Arthritis    Cancer (HCC)    prostate ca   seed implants   GERD (gastroesophageal reflux disease)    Glaucoma    Hyperlipidemia    Hypertension    dr cyrena     [place]   fm   Stroke [facility]) 07/2023     Past Surgical History:  Procedure Laterality Date   BACK SURGERY     x2   COLONOSCOPY     HERNIA REPAIR  11-06-2009   left inguinal repair    NECK SURGERY     ROTATOR CUFF REPAIR Left jan 2016   TOTAL HIP ARTHROPLASTY Right 04/20/2020   Procedure: RIGHT TOTAL HIP ARTHROPLASTY ANTERIOR APPROACH;  Surgeon: Jerri Kay HERO, MD;  Location:  [facility] OR;  Service: Orthopedics;  Laterality: Right;   TOTAL HIP ARTHROPLASTY Left 01/07/2021   Procedure: LEFT TOTAL HIP ARTHROPLASTY ANTERIOR APPROACH;  Surgeon: Jerri Kay HERO, MD;  Location: [facility] OR;  Service: Orthopedics;  Laterality: Left;   TRANSURETHRAL RESECTION OF PROSTATE     Family History  Problem Relation Age of Onset   Cancer Mother        breast   Heart disease Father     Cancer Brother    Cancer Brother    Social History   Tobacco Use   Smoking status: Former    Current packs/day: 0.00    Average packs/day: 0.5 packs/day for 30.0 years (15.0 ttl pk-yrs)    Types: Cigarettes    Start date: 03/29/1975    Quit date: 03/28/2005    Years since quitting: 19.1   Smokeless tobacco: Never  Vaping Use   Vaping status: Never Used  Substance Use Topics   Alcohol use: No    Alcohol/week: 0.0 standard drinks of alcohol   Drug use: No   Current Outpatient Medications  Medication Sig Dispense Refill   acetaminophen  (TYLENOL ) 500 MG tablet Take 1,000 mg by mouth every 6 (six) hours as needed for mild pain (pain score 1-3).     amiodarone  (PACERONE ) 200 MG tablet Take 1 tablet (200 mg total) by mouth daily. 90 tablet 3   apixaban  (ELIQUIS ) 2.5 MG TABS tablet Take 1 tablet (2.5 mg total) by mouth 2 (two) times daily. 60 tablet 11   atorvastatin  (LIPITOR) 40 MG tablet Take 1 tablet (40 mg total) by mouth daily. 90 tablet 1   cholecalciferol (VITAMIN D3) 25 MCG (1000 UNIT) tablet TAKE 1 TABLET BY MOUTH EVERY DAY 90 tablet 1   dapagliflozin  propanediol (FARXIGA ) 10 MG TABS tablet Take 1 tablet (10 mg total) by mouth daily. 90 tablet 3   dorzolamide -timolol  (COSOPT ) 22.3-6.8 MG/ML ophthalmic solution Place 1 drop into both eyes daily.      doxycycline  (VIBRAMYCIN ) 100 MG capsule Take 100 mg by mouth daily.     fluticasone  (FLONASE ) 50 MCG/ACT nasal spray SPRAY 2 SPRAYS INTO EACH NOSTRIL EVERY DAY (Patient taking differently: Place 2 sprays into both nostrils daily as needed for allergies.) 48 mL 2   Fluticasone -Umeclidin-Vilant (TRELEGY ELLIPTA ) 100-62.5-25 MCG/ACT AEPB Inhale 1 puff into the lungs daily. 28 each 5   metoprolol  succinate (TOPROL -XL) 25 MG 24 hr tablet TAKE 0.5 TABLETS (12.5 MG TOTAL) BY MOUTH DAILY. FOR BLOOD PRESSURE CONTROL 45 tablet 1   Oxycodone  HCl 10 MG TABS Take 1 tablet (10 mg total) by mouth every 6 (six) hours as needed for up to 120 doses.  120 tablet 0   pantoprazole  (PROTONIX ) 40 MG tablet Take 1 tablet (40 mg total) by mouth daily. For heartburn 90 tablet 3   potassium chloride  SA (KLOR-CON  M) 20 MEQ tablet TAKE 1 TABLET (20 MEQ TOTAL) BY MOUTH DAILY. AS A POTASSIUM SUPPLEMENT 90 tablet 1   rOPINIRole  (REQUIP ) 1 MG tablet Take 1 tablet (1 mg total) by mouth 3 (three) times daily. Plus an extra tablet at bedtime to total four a day 360 tablet 3   sacubitril -valsartan  (ENTRESTO ) 24-26 MG Take 1 tablet by mouth 2 (two) times daily. 60 tablet 3   senna-docusate (SENOKOT-S) 8.6-50 MG tablet Take 1 tablet by mouth daily as needed for mild constipation. 30 tablet 0   torsemide  (DEMADEX ) 20 MG tablet TAKE 2 TABLETS (40 MG TOTAL) BY MOUTH DAILY. 180 tablet 0   Current Facility-Administered  Medications  Medication Dose Route Frequency Provider Last Rate Last Admin   betamethasone  acetate-betamethasone  sodium phosphate  (CELESTONE ) injection 6 mg  6 mg Intramuscular Once        Allergies  Allergen Reactions   Iodinated Contrast Media Anaphylaxis    Tongue swelled up when he had MRI in [facility]  Intubated for airway protection after oral and throat swelling   Pravastatin  Other (See Comments)    Muscle aches     Review of Systems: All systems reviewed and negative except where noted in HPI.     Physical Exam:    Wt Readings from Last 3 Encounters:  05/12/24 191 lb (86.6 kg)  04/20/24 195 lb 4.8 oz (88.6 kg)  04/12/24 193 lb (87.5 kg)    BP 124/60   Pulse 60   Ht 6' (1.829 m)   Wt 191 lb (86.6 kg)   BMI 25.90 kg/m  Constitutional:  Pleasant, in no acute distress. Psychiatric: Normal mood and affect. Behavior is normal. Cardiovascular: Normal rate, regular rhythm. No edema Pulmonary/chest: Effort normal and breath sounds normal. No wheezing, rales or rhonchi. Abdominal: Soft, nondistended, nontender. Bowel sounds active throughout. There are no masses palpable. No hepatomegaly. Neurological: Alert and oriented to  person place and time. Skin: Skin is warm and dry. No rashes noted.   ASSESSMENT AND PLAN;   1) Abnormal PET scan 2) Metastatic adenocarcinoma 3) Thyroid  cancer 83 year old male with recently diagnosed metastatic adenocarcinoma.  Diagnosis established with biopsy of his left hip mass by IR showing metastatic adenocarcinoma moderately differentiated.  Ultrasound guided thyroid  biopsy that same day also confirmed malignancy.  Per Oncology, unclear if this represents 2 primaries or metastatic thyroid  cancer.  Since the hip mass is showing adenocarcinoma, requested expedited EGD/colonoscopy to ensure no GI primary.  He is otherwise without GI symptoms.  PET scan did not show any abnormal uptake in the GI tract.  - Plan for expedited EGD/colonoscopy in the [facility].  Discussed his case with Dr. Charlanne who is able to assist in expediting these procedures on 05/18/2024 as he has sooner availability that I do.  Discussed with the patient who was agreeable to that plan - Plan for excisional biopsy of thyroid  with Dr. Eletha - To follow-up in the Oncology clinic afterwards  4) Atrial fibrillation 5) History of CVA 6) Chronic anticoagulation 7) CHF with EF 50-55% - Hold Eliquis  2 days before procedure - will instruct when and how to resume after procedure.  This was recently held for his biopsies without any issue.  Low but real risk of cardiovascular event such as heart attack, stroke, embolism, thrombosis or ischemia/infarct of other organs off Eliquis  explained and need to seek urgent help if this occurs. The patient consents to proceed. Will communicate by phone or EMR with patient's prescribing provider to confirm that holding Eliquis  is reasonable in this case - Conferred with Anesthesia service for case review  The indications, risks, and benefits of EGD and colonoscopy were explained to the patient in detail. Risks include but are not limited to bleeding, perforation, adverse reaction to medications, and  cardiopulmonary compromise. Sequelae include but are not limited to the possibility of surgery, hospitalization, and mortality. The patient verbalized understanding and wished to proceed. All questions answered, referred to scheduler and bowel prep ordered. Further recommendations pending results of the exam.   I spent 60 minutes of time, including in depth chart review, independent review of results as outlined above, communicating results with the patient and family members directly,  face-to-face time with the patient, coordinating care with other physicians and service lines, ordering studies and medications as appropriate, and documentation.     Sandor LULLA Flatter, DO, FACG  05/12/2024, 2:44 PM   Joshua Lowers, MD

## 2024-05-13 DIAGNOSIS — S81801D Unspecified open wound, right lower leg, subsequent encounter: Secondary | ICD-10-CM | POA: Diagnosis not present

## 2024-05-13 NOTE — Telephone Encounter (Signed)
 Patient with diagnosis of afib on Eliquis  for anticoagulation.    Procedure: Endo/Colon  Date of procedure: 05/18/24   CHA2DS2-VASc Score = 7   This indicates a 11.2% annual risk of stroke. The patient's score is based upon: CHF History: 1 HTN History: 1 Diabetes History: 0 Stroke History: 2 Vascular Disease History: 1 Age Score: 2 Gender Score: 0      Patient had stroke in 07/2023. Metastatic cancer  CrCl 31 ml/min Platelet count 270  Patient has not had an Afib/aflutter ablation within the last 3 months or DCCV within the last 30 days  Per office protocol, patient can hold Eliquis  for 2 days prior to procedure.    **This guidance is not considered finalized until pre-operative APP has relayed final recommendations.**

## 2024-05-13 NOTE — Telephone Encounter (Signed)
   Patient Name: Joshua Compton.  DOB: 1941/07/14 MRN: [id number]  Primary Cardiologist: Alvan Carrier, MD  Clinical pharmacists have reviewed the patient's past medical history, labs, and current medications as part of preoperative protocol coverage. The following recommendations have been made:  Patient with diagnosis of afib on Eliquis  for anticoagulation.     Procedure: Endo/Colon  Date of procedure: 05/18/24     CHA2DS2-VASc Score = 7   This indicates a 11.2% annual risk of stroke. The patient's score is based upon: CHF History: 1 HTN History: 1 Diabetes History: 0 Stroke History: 2 Vascular Disease History: 1 Age Score: 2 Gender Score: 0       Patient had stroke in 07/2023. Metastatic cancer   CrCl 31 ml/min Platelet count 270   Patient has not had an Afib/aflutter ablation within the last 3 months or DCCV within the last 30 days   Per office protocol, patient can hold Eliquis  for 2 days prior to procedure.         I will route this recommendation to the requesting party via Epic fax function and remove from pre-op pool.  Please call with questions.  Lamarr Satterfield, NP 05/13/2024, 2:26 PM

## 2024-05-16 ENCOUNTER — Encounter: Payer: Self-pay | Admitting: Physician Assistant

## 2024-05-17 NOTE — Telephone Encounter (Signed)
 called pateint and asked him or daughter to call us  back.

## 2024-05-17 NOTE — Telephone Encounter (Signed)
-----   Message from [place], [place] sent at 05/17/2024  9:04 AM EDT ----- Wound culture is negative for bacterial infection. Please complete the course of doxycycline  and follow up with wound clinic and PCP for additional management.  ----- Message ----- From: Interface, [facility] Lab Results In Sent: 05/17/2024   7:36 AM EDT To: [facility] Provider #

## 2024-05-17 NOTE — Telephone Encounter (Signed)
 Spoke with Natalie who stated that she thinks her father took his Eliquis  this morning.  Will make Dr San aware and see what his recommendations are.

## 2024-05-17 NOTE — Telephone Encounter (Signed)
 Natalie stated that her father did take his Eliquis  this morning.  Natalie advised that procedure for endo/colon will need to be rescheduled for a later date.  Natalie aware tat patient has been rescheduled for 05-23-24 at 10:30am.  New instructions sent to patient in MyChart.  [name] verbalized understanding and had no further questions or concerns.

## 2024-05-17 NOTE — Telephone Encounter (Signed)
 Left message on voicemail for [name] to return call to our office to verify patient is holding Eliquis  prior to procedure for tomorrow.  Will continue efforts.

## 2024-05-17 NOTE — Telephone Encounter (Signed)
 Daughter called back and she and dad were given results of Wound culture is negative for bacterial infection. Please complete the course of doxycycline  and follow up with wound clinic and PCP. Patient feels better and expressed understainding.

## 2024-05-18 ENCOUNTER — Encounter: Admitting: Gastroenterology

## 2024-05-18 NOTE — Telephone Encounter (Signed)
 Patients daughter, Natalie advised that he has been given clearance to hold Eliquis  2 days prior to EGD/Colon scheduled for 05-23-24.  Patients daughter, Natalie advised to take last dose of Eliquis  on 05-20-24, and he will be advised when to restart Eliquis  by Dr San after the procedure.  Patient agreed to plan and verbalized understanding.  No further questions.

## 2024-05-19 ENCOUNTER — Encounter: Payer: Self-pay | Admitting: Physician Assistant

## 2024-05-19 ENCOUNTER — Other Ambulatory Visit: Payer: Self-pay | Admitting: Physician Assistant

## 2024-05-19 ENCOUNTER — Telehealth: Payer: Self-pay | Admitting: Cardiology

## 2024-05-19 DIAGNOSIS — C799 Secondary malignant neoplasm of unspecified site: Secondary | ICD-10-CM

## 2024-05-19 DIAGNOSIS — E041 Nontoxic single thyroid nodule: Secondary | ICD-10-CM | POA: Diagnosis not present

## 2024-05-19 DIAGNOSIS — C801 Malignant (primary) neoplasm, unspecified: Secondary | ICD-10-CM | POA: Diagnosis not present

## 2024-05-19 DIAGNOSIS — C7989 Secondary malignant neoplasm of other specified sites: Secondary | ICD-10-CM | POA: Diagnosis not present

## 2024-05-19 MED ORDER — OXYCODONE HCL ER 10 MG PO T12A: 10.0000 mg | EXTENDED_RELEASE_TABLET | Freq: Two times a day (BID) | ORAL | 0 refills | Status: DC

## 2024-05-19 NOTE — Telephone Encounter (Signed)
   Pre-operative Risk Assessment    Patient Name: Joshua Compton.  DOB: 06-24-1941 MRN: [id number]      Request for Surgical Clearance    Procedure:  Thyroid  Surgery  Date of Surgery:  Clearance TBD                                 Surgeon:  Krystal Spinner, MD Surgeon's Group or Practice Name:  [facility] Phone number:  [phone] Fax number:  [phone]   Type of Clearance Requested:   - Medical  - Pharmacy:  Hold    if applicable   Type of Anesthesia:  General    Additional requests/questions:    SignedAlan KANDICE Corona   05/19/2024, 2:43 PM

## 2024-05-23 ENCOUNTER — Encounter: Payer: Self-pay | Admitting: Gastroenterology

## 2024-05-23 ENCOUNTER — Ambulatory Visit: Admitting: Gastroenterology

## 2024-05-23 VITALS — BP 179/61 | HR 56 | Temp 98.4°F | Resp 12 | Ht 72.0 in | Wt 191.0 lb

## 2024-05-23 DIAGNOSIS — K317 Polyp of stomach and duodenum: Secondary | ICD-10-CM | POA: Diagnosis not present

## 2024-05-23 DIAGNOSIS — K2970 Gastritis, unspecified, without bleeding: Secondary | ICD-10-CM | POA: Diagnosis not present

## 2024-05-23 DIAGNOSIS — K635 Polyp of colon: Secondary | ICD-10-CM | POA: Diagnosis not present

## 2024-05-23 DIAGNOSIS — D120 Benign neoplasm of cecum: Secondary | ICD-10-CM | POA: Diagnosis not present

## 2024-05-23 DIAGNOSIS — D128 Benign neoplasm of rectum: Secondary | ICD-10-CM

## 2024-05-23 DIAGNOSIS — D125 Benign neoplasm of sigmoid colon: Secondary | ICD-10-CM

## 2024-05-23 DIAGNOSIS — C799 Secondary malignant neoplasm of unspecified site: Secondary | ICD-10-CM

## 2024-05-23 DIAGNOSIS — K648 Other hemorrhoids: Secondary | ICD-10-CM | POA: Diagnosis not present

## 2024-05-23 DIAGNOSIS — R948 Abnormal results of function studies of other organs and systems: Secondary | ICD-10-CM

## 2024-05-23 DIAGNOSIS — R933 Abnormal findings on diagnostic imaging of other parts of digestive tract: Secondary | ICD-10-CM | POA: Diagnosis not present

## 2024-05-23 DIAGNOSIS — K31A12 Gastric intestinal metaplasia without dysplasia, involving the body (corpus): Secondary | ICD-10-CM | POA: Diagnosis not present

## 2024-05-23 DIAGNOSIS — K31A19 Gastric intestinal metaplasia without dysplasia, unspecified site: Secondary | ICD-10-CM | POA: Diagnosis not present

## 2024-05-23 DIAGNOSIS — K621 Rectal polyp: Secondary | ICD-10-CM | POA: Diagnosis not present

## 2024-05-23 DIAGNOSIS — K641 Second degree hemorrhoids: Secondary | ICD-10-CM | POA: Diagnosis not present

## 2024-05-23 DIAGNOSIS — K3189 Other diseases of stomach and duodenum: Secondary | ICD-10-CM | POA: Diagnosis not present

## 2024-05-23 MED ORDER — SODIUM CHLORIDE 0.9 % IV SOLN: 500.0000 mL | Freq: Once | INTRAVENOUS | Status: DC

## 2024-05-23 NOTE — Op Note (Signed)
 [facility] Patient Name: Joshua Compton Procedure Date: 05/23/2024 11:47 AM MRN: [id number] Endoscopist: Sandor Flatter , MD, [id number] Age: 83 Referring MD:  Date of Birth: March 04, 1941 Gender: Male Account #: [id number] Procedure:                Colonoscopy Indications:              Abnormal CT of the GI tract, Abnormal PET scan of                            the GI tract Medicines:                Monitored Anesthesia Care Procedure:                Pre-Anesthesia Assessment:                           - Prior to the procedure, a History and Physical                            was performed, and patient medications and                            allergies were reviewed. The patient's tolerance of                            previous anesthesia was also reviewed. The risks                            and benefits of the procedure and the sedation                            options and risks were discussed with the patient.                            All questions were answered, and informed consent                            was obtained. Prior Anticoagulants: The patient has                            taken Eliquis  (apixaban ), last dose was 2 days                            prior to procedure. ASA Grade Assessment: III - A                            patient with severe systemic disease. After                            reviewing the risks and benefits, the patient was                            deemed in satisfactory condition to undergo the  procedure.                           After obtaining informed consent, the colonoscope                            was passed under direct vision. Throughout the                            procedure, the patient's blood pressure, pulse, and                            oxygen  saturations were monitored continuously. The                            CF [id number] #[id number] was introduced through the anus                             and advanced to the the cecum, identified by                            appendiceal orifice and ileocecal valve. The                            colonoscopy was performed without difficulty. The                            patient tolerated the procedure well. The quality                            of the bowel preparation was good. The ileocecal                            valve, appendiceal orifice, and rectum were                            photographed. Scope In: 12:09:49 PM Scope Out: 12:31:06 PM Scope Withdrawal Time: 0 hours 13 minutes 50 seconds  Total Procedure Duration: 0 hours 21 minutes 17 seconds  Findings:                 The perianal and digital rectal examinations were                            normal.                           A 5 mm polyp was found in the cecum. The polyp was                            sessile. The polyp was removed with a cold snare.                            Resection and retrieval were complete. Estimated  blood loss was minimal.                           Two sessile polyps were found in the rectum and                            sigmoid colon. The polyps were 2 to 4 mm in size.                            These polyps were removed with a cold snare.                            Resection and retrieval were complete. Estimated                            blood loss was minimal.                           Non-bleeding internal hemorrhoids were found during                            retroflexion. The hemorrhoids were small. Complications:            No immediate complications. Estimated Blood Loss:     Estimated blood loss was minimal. Impression:               - One 5 mm polyp in the cecum, removed with a cold                            snare. Resected and retrieved.                           - Two 2 to 4 mm polyps in the rectum and in the                            sigmoid colon, removed with a cold snare. Resected                             and retrieved.                           - Non-bleeding internal hemorrhoids. Recommendation:           - Patient has a contact number available for                            emergencies. The signs and symptoms of potential                            delayed complications were discussed with the                            patient. Return to normal activities tomorrow.  Written discharge instructions were provided to the                            patient.                           - Resume previous diet.                           - Resume Eliquis  (apixaban ) at prior dose tomorrow.                           - Await pathology results. Sandor Flatter, MD 05/23/2024 12:43:49 PM

## 2024-05-23 NOTE — Patient Instructions (Addendum)
 Patient has a contact number available for                            emergencies. The signs and symptoms of potential                            delayed complications were discussed with the                            patient. Return to normal activities tomorrow.                            Written discharge instructions were provided to the                            patient.                           - Resume previous diet.                           - Resume Eliquis  (apixaban ) at prior dose tomorrow.                           - Await pathology results.  Handout on polyps given.        YOU HAD AN ENDOSCOPIC PROCEDURE TODAY AT THE [facility]:   Refer to the procedure report that was given to you for any specific questions about what was found during the examination.  If the procedure report does not answer your questions, please call your gastroenterologist to clarify.  If you requested that your care partner not be given the details of your procedure findings, then the procedure report has been included in a sealed envelope for you to review at your convenience later.  YOU SHOULD EXPECT: Some feelings of bloating in the abdomen. Passage of more gas than usual.  Walking can help get rid of the air that was put into your GI tract during the procedure and reduce the bloating. If you had a lower endoscopy (such as a colonoscopy or flexible sigmoidoscopy) you may notice spotting of blood in your stool or on the toilet paper. If you underwent a bowel prep for your procedure, you may not have a normal bowel movement for a few days.  Please Note:  You might notice some irritation and congestion in your nose or some drainage.  This is from the oxygen  used during your procedure.  There is no need for concern and it should clear up in a day or so.  SYMPTOMS TO REPORT IMMEDIATELY:  Following lower endoscopy (colonoscopy or flexible sigmoidoscopy):  Excessive amounts of blood in the  stool  Significant tenderness or worsening of abdominal pains  Swelling of the abdomen that is new, acute  Fever of 100F or higher  Following upper endoscopy (EGD)  Vomiting of blood or coffee ground material  New chest pain or pain under the shoulder blades  Painful or persistently difficult swallowing  New shortness of breath  Fever of 100F or higher  Black, tarry-looking stools  For urgent or emergent issues, a gastroenterologist can be reached at any hour by calling [phone].  Do not use MyChart messaging for urgent concerns.    DIET:  We do recommend a small meal at first, but then you may proceed to your regular diet.  Drink plenty of fluids but you should avoid alcoholic beverages for 24 hours.  ACTIVITY:  You should plan to take it easy for the rest of today and you should NOT DRIVE or use heavy machinery until tomorrow (because of the sedation medicines used during the test).    FOLLOW UP: Our staff will call the number listed on your records the next business day following your procedure.  We will call around 7:15- 8:00 am to check on you and address any questions or concerns that you may have regarding the information given to you following your procedure. If we do not reach you, we will leave a message.     If any biopsies were taken you will be contacted by phone or by letter within the next 1-3 weeks.  Please call us  at [phone] if you have not heard about the biopsies in 3 weeks.    SIGNATURES/CONFIDENTIALITY: You and/or your care partner have signed paperwork which will be entered into your electronic medical record.  These signatures attest to the fact that that the information above on your After Visit Summary has been reviewed and is understood.  Full responsibility of the confidentiality of this discharge information lies with you and/or your care-partner.

## 2024-05-23 NOTE — Progress Notes (Signed)
 Vss nad trans to pacu

## 2024-05-23 NOTE — Op Note (Signed)
 [facility] Patient Name: Joshua Compton Procedure Date: 05/23/2024 11:57 AM MRN: [id number] Endoscopist: Sandor Flatter , MD, [id number] Age: 83 Referring MD:  Date of Birth: 08-01-1941 Gender: Male Account #: [id number] Procedure:                Upper GI endoscopy Indications:              Abnormal CT of the GI tract, Abnormal PET scan of                            the GI tract Medicines:                Monitored Anesthesia Care Procedure:                Pre-Anesthesia Assessment:                           - Prior to the procedure, a History and Physical                            was performed, and patient medications and                            allergies were reviewed. The patient's tolerance of                            previous anesthesia was also reviewed. The risks                            and benefits of the procedure and the sedation                            options and risks were discussed with the patient.                            All questions were answered, and informed consent                            was obtained. Prior Anticoagulants: The patient has                            taken Eliquis  (apixaban ), last dose was 2 days                            prior to procedure. ASA Grade Assessment: III - A                            patient with severe systemic disease. After                            reviewing the risks and benefits, the patient was                            deemed in satisfactory condition to undergo the  procedure.                           After obtaining informed consent, the endoscope was                            passed under direct vision. Throughout the                            procedure, the patient's blood pressure, pulse, and                            oxygen  saturations were monitored continuously. The                            GIF [id number] #[id number] was introduced through the                             mouth, and advanced to the second part of duodenum.                            The upper GI endoscopy was accomplished without                            difficulty. The patient tolerated the procedure                            well. Scope In: Scope Out: Findings:                 The examined esophagus was normal.                           A single 5 mm sessile polyp was found in the                            gastric body. The polyp was removed with a hot                            snare. Resection and retrieval were complete.                            Estimated blood loss: none.                           Localized moderate inflammation characterized by                            congestion (edema) and erythema was found in the                            prepyloric region of the stomach. Biopsies were                            taken with a cold forceps for histology. Estimated  blood loss was minimal.                           The examined duodenum was normal. Complications:            No immediate complications. Estimated Blood Loss:     Estimated blood loss was minimal. Impression:               - Normal esophagus.                           - A single gastric polyp. Resected and retrieved.                           - Gastritis. Biopsied.                           - Normal examined duodenum. Recommendation:           - Patient has a contact number available for                            emergencies. The signs and symptoms of potential                            delayed complications were discussed with the                            patient. Return to normal activities tomorrow.                            Written discharge instructions were provided to the                            patient.                           - Resume previous diet.                           - Await pathology results.                           - Colonoscopy today. Sandor Flatter, MD 05/23/2024 12:38:58 PM

## 2024-05-23 NOTE — Progress Notes (Signed)
 Called to room to assist during endoscopic procedure.  Patient ID and intended procedure confirmed with present staff. Received instructions for my participation in the procedure from the performing physician.

## 2024-05-23 NOTE — Progress Notes (Signed)
 GASTROENTEROLOGY PROCEDURE H&P NOTE   Primary Care Physician: Zollie Lowers, MD    Reason for Procedure:   Abnormal PET scan   Plan:    EGD/colonoscopy  Patient is appropriate for endoscopic procedure(s) in the ambulatory ([facility]) setting.  The nature of the procedure, as well as the risks, benefits, and alternatives were carefully and thoroughly reviewed with the patient. Ample time for discussion and questions allowed. The patient understood, was satisfied, and agreed to proceed.     HPI: Joshua Compton. is a 83 y.o. male who presents for EGD/Colonoscopy for expedited evaluation of abnormal PET CT and metastatic adenocarcinoma as outlined below.  Patient was most recently seen in the Gastroenterology Clinic on 05/12/2024 by me.  No interval change in medical history since that appointment. Please refer to that note for full details regarding GI history and clinical presentation.   He is otherwise without any GI symptoms. No nausea/vomiting, change in bowel habits, overt GI bleeding. Weight stable, no early satiety.   He was seen in the Hematology/Oncology clinic on 04/20/2024 for evaluation of abnormal CT chest as below.   - 04/16/2024: CT chest ordered in the ER due to chest pain/back pain x 1 month, notable for increased masslike soft tissue density within an area of paraseptal emphysema in the LEFT lower lobe. This is concerning for primary lung neoplasm although superimposed infection is in the differential. There is an expansile area of erosive appearing lucency in the LEFT posterior fourth rib with possible associated nondisplaced rib fracture. This is concerning for a pathologic fracture. Mildly increased conspicuity of additional LEFT lower lobe pulmonary nodules. - 04/16/2024: WBC 11.4, otherwise normal CBC.  BUN/creatinine 36/2.15 - 04/20/2024: Evaluation in [facility] clinic for pathologic rib fracture and soft tissue mass in [facility].  Referred for PET scan, labs, CT biopsy  of lung mass.  Normal CBC.  Normal liver enzymes, BUN/creatinine 33/2.25. - 04/29/2024: PET scan: Intensely hypermetabolic right thyroid  mass, peripheral nodular thickening in the left lower lobe with mild metabolic activity for size, single hypermetabolic left hilar lymph node, multiple hypermetabolic skeletal metastases.  Multiple hypermetabolic muscular metastases with lesion in left gluteus muscle amenable to biopsy.  Differential includes thyroid  carcinoma metastases versus lung cancer metastases, favoring thyroid  carcinoma.  Emphysema. - 05/05/2024: Thyroid  ultrasound: Solitary dominant 4.8 cm right inferior thyroid  nodule.  Nodule biopsy by IR: Positive for malignancy - 05/05/2024: Left hip mass biopsy by IR: Metastatic adenocarcinoma, moderately differentiated  Holding Eliquis  for procedures today.     Past Medical History:  Diagnosis Date   Acute heart failure with preserved ejection fraction (HFpEF) (HCC) 09/05/2023   Acute HFrEF (heart failure with reduced ejection fraction) (HCC) 09/08/2023   Acute respiratory failure with hypoxia (HCC) 09/05/2023   Arthritis    Cancer (HCC)    prostate ca   seed implants   CHF (congestive heart failure) (HCC)    Clotting disorder (HCC)    GERD (gastroesophageal reflux disease)    Glaucoma    Hyperlipidemia    Hypertension    dr cyrena     [facility]   fm   Stroke [facility]) 07/2023    Past Surgical History:  Procedure Laterality Date   BACK SURGERY     x2   COLONOSCOPY     HERNIA REPAIR  11-06-2009   left inguinal repair    NECK SURGERY     ROTATOR CUFF REPAIR Left jan 2016   TOTAL HIP ARTHROPLASTY Right 04/20/2020   Procedure: RIGHT TOTAL HIP  ARTHROPLASTY ANTERIOR APPROACH;  Surgeon: Jerri Kay HERO, MD;  Location: [facility] OR;  Service: Orthopedics;  Laterality: Right;   TOTAL HIP ARTHROPLASTY Left 01/07/2021   Procedure: LEFT TOTAL HIP ARTHROPLASTY ANTERIOR APPROACH;  Surgeon: Jerri Kay HERO, MD;  Location: [facility] OR;  Service: Orthopedics;  Laterality:  Left;   TRANSURETHRAL RESECTION OF PROSTATE      Prior to Admission medications   Medication Sig Start Date End Date Taking? Authorizing Provider  acetaminophen  (TYLENOL ) 500 MG tablet Take 1,000 mg by mouth every 6 (six) hours as needed for mild pain (pain score 1-3).   Yes [provider]  amiodarone  (PACERONE ) 200 MG tablet Take 1 tablet (200 mg total) by mouth daily. 02/25/24  Yes Branch, Dorn FALCON, MD  atorvastatin  (LIPITOR) 40 MG tablet Take 1 tablet (40 mg total) by mouth daily. 02/18/24  Yes Stacks, Butler, MD  cholecalciferol (VITAMIN D3) 25 MCG (1000 UNIT) tablet TAKE 1 TABLET BY MOUTH EVERY DAY 02/16/24  Yes Zollie Butler, MD  dapagliflozin  propanediol (FARXIGA ) 10 MG TABS tablet Take 1 tablet (10 mg total) by mouth daily. 11/30/23  Yes Stacks, Butler, MD  metoprolol  succinate (TOPROL -XL) 25 MG 24 hr tablet TAKE 0.5 TABLETS (12.5 MG TOTAL) BY MOUTH DAILY. FOR BLOOD PRESSURE CONTROL 01/07/24  Yes Branch, Dorn FALCON, MD  Oxycodone  HCl 10 MG TABS Take 1 tablet (10 mg total) by mouth every 6 (six) hours as needed for up to 120 doses. 05/02/24  Yes Thayil, Irene T, PA-C  pantoprazole  (PROTONIX ) 40 MG tablet Take 1 tablet (40 mg total) by mouth daily. For heartburn 07/28/23  Yes Stacks, Butler, MD  potassium chloride  SA (KLOR-CON  M) 20 MEQ tablet TAKE 1 TABLET (20 MEQ TOTAL) BY MOUTH DAILY. AS A POTASSIUM SUPPLEMENT 03/21/24  Yes Zollie Butler, MD  rOPINIRole  (REQUIP ) 1 MG tablet Take 1 tablet (1 mg total) by mouth 3 (three) times daily. Plus an extra tablet at bedtime to total four a day 11/30/23  Yes Stacks, Butler, MD  sacubitril -valsartan  (ENTRESTO ) 24-26 MG Take 1 tablet by mouth 2 (two) times daily. 04/05/24  Yes Stacks, Butler, MD  torsemide  (DEMADEX ) 20 MG tablet TAKE 2 TABLETS (40 MG TOTAL) BY MOUTH DAILY. 05/09/24  Yes Zollie Butler, MD  apixaban  (ELIQUIS ) 2.5 MG TABS tablet Take 1 tablet (2.5 mg total) by mouth 2 (two) times daily. 02/25/24   Alvan Dorn FALCON, MD   dorzolamide -timolol  (COSOPT ) 22.3-6.8 MG/ML ophthalmic solution Place 1 drop into both eyes daily.  08/04/16   [provider]  fluticasone  (FLONASE ) 50 MCG/ACT nasal spray SPRAY 2 SPRAYS INTO EACH NOSTRIL EVERY DAY Patient taking differently: Place 2 sprays into both nostrils daily as needed for allergies. 09/29/23   Zollie Butler, MD  Fluticasone -Umeclidin-Vilant (TRELEGY ELLIPTA ) 100-62.5-25 MCG/ACT AEPB Inhale 1 puff into the lungs daily. 01/22/24   Jude Harden GAILS, MD  oxyCODONE  (OXYCONTIN ) 10 mg 12 hr tablet Take 1 tablet (10 mg total) by mouth every 12 (twelve) hours. Patient not taking: Reported on 05/23/2024 05/19/24   Thayil, Irene T, PA-C    Current Outpatient Medications  Medication Sig Dispense Refill   acetaminophen  (TYLENOL ) 500 MG tablet Take 1,000 mg by mouth every 6 (six) hours as needed for mild pain (pain score 1-3).     amiodarone  (PACERONE ) 200 MG tablet Take 1 tablet (200 mg total) by mouth daily. 90 tablet 3   atorvastatin  (LIPITOR) 40 MG tablet Take 1 tablet (40 mg total) by mouth daily. 90 tablet 1   cholecalciferol (VITAMIN D3)  25 MCG (1000 UNIT) tablet TAKE 1 TABLET BY MOUTH EVERY DAY 90 tablet 1   dapagliflozin  propanediol (FARXIGA ) 10 MG TABS tablet Take 1 tablet (10 mg total) by mouth daily. 90 tablet 3   metoprolol  succinate (TOPROL -XL) 25 MG 24 hr tablet TAKE 0.5 TABLETS (12.5 MG TOTAL) BY MOUTH DAILY. FOR BLOOD PRESSURE CONTROL 45 tablet 1   Oxycodone  HCl 10 MG TABS Take 1 tablet (10 mg total) by mouth every 6 (six) hours as needed for up to 120 doses. 120 tablet 0   pantoprazole  (PROTONIX ) 40 MG tablet Take 1 tablet (40 mg total) by mouth daily. For heartburn 90 tablet 3   potassium chloride  SA (KLOR-CON  M) 20 MEQ tablet TAKE 1 TABLET (20 MEQ TOTAL) BY MOUTH DAILY. AS A POTASSIUM SUPPLEMENT 90 tablet 1   rOPINIRole  (REQUIP ) 1 MG tablet Take 1 tablet (1 mg total) by mouth 3 (three) times daily. Plus an extra tablet at bedtime to total four a day 360 tablet 3    sacubitril -valsartan  (ENTRESTO ) 24-26 MG Take 1 tablet by mouth 2 (two) times daily. 60 tablet 3   torsemide  (DEMADEX ) 20 MG tablet TAKE 2 TABLETS (40 MG TOTAL) BY MOUTH DAILY. 180 tablet 0   apixaban  (ELIQUIS ) 2.5 MG TABS tablet Take 1 tablet (2.5 mg total) by mouth 2 (two) times daily. 60 tablet 11   dorzolamide -timolol  (COSOPT ) 22.3-6.8 MG/ML ophthalmic solution Place 1 drop into both eyes daily.      fluticasone  (FLONASE ) 50 MCG/ACT nasal spray SPRAY 2 SPRAYS INTO EACH NOSTRIL EVERY DAY (Patient taking differently: Place 2 sprays into both nostrils daily as needed for allergies.) 48 mL 2   Fluticasone -Umeclidin-Vilant (TRELEGY ELLIPTA ) 100-62.5-25 MCG/ACT AEPB Inhale 1 puff into the lungs daily. 28 each 5   oxyCODONE  (OXYCONTIN ) 10 mg 12 hr tablet Take 1 tablet (10 mg total) by mouth every 12 (twelve) hours. (Patient not taking: Reported on 05/23/2024) 60 tablet 0   Current Facility-Administered Medications  Medication Dose Route Frequency Provider Last Rate Last Admin   0.9 %  sodium chloride  infusion  500 mL Intravenous Once Aarianna Hoadley V, DO       betamethasone  acetate-betamethasone  sodium phosphate  (CELESTONE ) injection 6 mg  6 mg Intramuscular Once         Allergies as of 05/23/2024 - Review Complete 05/23/2024  Allergen Reaction Noted   Iodinated contrast media Anaphylaxis 08/07/2023   Pravastatin  Other (See Comments) 12/08/2013    Family History  Problem Relation Age of Onset   Cancer Mother        breast   Heart disease Father    Cancer Brother    Cancer Brother    Colon cancer Neg Hx    Rectal cancer Neg Hx    Stomach cancer Neg Hx     Social History   Socioeconomic History   Marital status: Married    Spouse name: Not on file   Number of children: 5   Years of education: 11   Highest education level: 12th grade  Occupational History   Occupation: Nutritional therapist    Comment: retired  Tobacco Use   Smoking status: Former    Current packs/day: 0.00     Average packs/day: 0.5 packs/day for 30.0 years (15.0 ttl pk-yrs)    Types: Cigarettes    Start date: 03/29/1975    Quit date: 03/28/2005    Years since quitting: 19.1   Smokeless tobacco: Never  Vaping Use   Vaping status: Never Used  Substance and Sexual  Activity   Alcohol use: No    Alcohol/week: 0.0 standard drinks of alcohol   Drug use: No   Sexual activity: Yes  Other Topics Concern   Not on file  Social History Narrative   Grandson lives with them - no stairs   Social Drivers of Corporate investment banker Strain: Low Risk  (02/23/2024)   Overall Financial Resource Strain (CARDIA)    Difficulty of Paying Living Expenses: Not hard at all  Food Insecurity: No Food Insecurity (05/10/2024)   Received from [facility]   Hunger Vital Sign    Within the past 12 months, you worried that your food would run out before you got the money to buy more.: Never true    Within the past 12 months, the food you bought just didn't last and you didn't have money to get more.: Never true  Transportation Needs: No Transportation Needs (05/10/2024)   Received from [facility]   PRAPARE - Transportation    Lack of Transportation (Medical): No    Lack of Transportation (Non-Medical): No  Physical Activity: Unknown (02/23/2024)   Exercise Vital Sign    Days of Exercise per Week: 0 days    Minutes of Exercise per Session: Not on file  Stress: No Stress Concern Present (02/23/2024)   [employer] of Occupational Health - Occupational Stress Questionnaire    Feeling of Stress : Not at all  Social Connections: Moderately Integrated (02/23/2024)   Social Connection and Isolation Panel    Frequency of Communication with Friends and Family: More than three times a week    Frequency of Social Gatherings with Friends and Family: Twice a week    Attends Religious Services: More than 4 times per year    Active Member of [employer] or Organizations: No    Attends Banker Meetings: Not on  file    Marital Status: Married  Catering manager Violence: Not At Risk (09/05/2023)   Humiliation, Afraid, Rape, and Kick questionnaire    Fear of Current or Ex-Partner: No    Emotionally Abused: No    Physically Abused: No    Sexually Abused: No    Physical Exam: Vital signs in last 24 hours: @BP  (!) 151/59   Pulse (!) 58   Temp 98.4 F (36.9 C)   Ht 6' (1.829 m)   Wt 191 lb (86.6 kg)   SpO2 94%   BMI 25.90 kg/m  GEN: NAD EYE: Sclerae anicteric ENT: MMM CV: Non-tachycardic Pulm: CTA b/l GI: Soft, NT/ND NEURO:  Alert & Oriented x 3   Sandor Flatter, DO [facility]   05/23/2024 11:51 AM

## 2024-05-24 ENCOUNTER — Telehealth: Payer: Self-pay | Admitting: *Deleted

## 2024-05-24 ENCOUNTER — Telehealth: Payer: Self-pay

## 2024-05-24 NOTE — Telephone Encounter (Signed)
Patient has been scheduled for televisit.

## 2024-05-24 NOTE — Telephone Encounter (Signed)
 Patient has been scheduled for televisit med rec and consent done     Patient Consent for Virtual Visit         Joshua Compton. has provided verbal consent on 05/24/2024 for a virtual visit (video or telephone).   CONSENT FOR VIRTUAL VISIT FOR:  Joshua Compton.  By participating in this virtual visit I agree to the following:  I hereby voluntarily request, consent and authorize [facility] and its employed or contracted physicians, physician assistants, nurse practitioners or other licensed health care professionals (the Practitioner), to provide me with telemedicine health care services (the "Services) as deemed necessary by the treating Practitioner. I acknowledge and consent to receive the Services by the Practitioner via telemedicine. I understand that the telemedicine visit will involve communicating with the Practitioner through live audiovisual communication technology and the disclosure of certain medical information by electronic transmission. I acknowledge that I have been given the opportunity to request an in-person assessment or other available alternative prior to the telemedicine visit and am voluntarily participating in the telemedicine visit.  I understand that I have the right to withhold or withdraw my consent to the use of telemedicine in the course of my care at any time, without affecting my right to Joshua care or treatment, and that the Practitioner or I may terminate the telemedicine visit at any time. I understand that I have the right to inspect all information obtained and/or recorded in the course of the telemedicine visit and may receive copies of available information for a reasonable fee.  I understand that some of the potential risks of receiving the Services via telemedicine include:  Delay or interruption in medical evaluation due to technological equipment failure or disruption; Information transmitted may not be sufficient (e.g. poor  resolution of images) to allow for appropriate medical decision making by the Practitioner; and/or  In rare instances, security protocols could fail, causing a breach of personal health information.  Furthermore, I acknowledge that it is my responsibility to provide information about my medical history, conditions and care that is complete and accurate to the best of my ability. I acknowledge that Practitioner's advice, recommendations, and/or decision may be based on factors not within their control, such as incomplete or inaccurate data provided by me or distortions of diagnostic images or specimens that may result from electronic transmissions. I understand that the practice of medicine is not an exact science and that Practitioner makes no warranties or guarantees regarding treatment outcomes. I acknowledge that a copy of this consent can be made available to me via my patient portal [facility] MyChart), or I can request a printed copy by calling the office of [facility].    I understand that my insurance will be billed for this visit.   I have read or had this consent read to me. I understand the contents of this consent, which adequately explains the benefits and risks of the Services being provided via telemedicine.  I have been provided ample opportunity to ask questions regarding this consent and the Services and have had my questions answered to my satisfaction. I give my informed consent for the services to be provided through the use of telemedicine in my medical care

## 2024-05-24 NOTE — Telephone Encounter (Signed)
  Follow up Call-     05/23/2024   11:03 AM  Call back number  Post procedure Call Back phone  # [phone]  Permission to leave phone message Yes     Patient questions:  Message left to call us  if necessary.

## 2024-05-24 NOTE — Telephone Encounter (Signed)
   Name: Joshua Compton.  DOB: 10/01/1941  MRN: [id number]  Primary Cardiologist: Alvan Carrier, MD   Preoperative team, please contact this patient and set up a phone call appointment for further preoperative risk assessment. Please obtain consent and complete medication review. Thank you for your help.  I confirm that guidance regarding antiplatelet and oral anticoagulation therapy has been completed and, if necessary, noted below.  Per office protocol, patient can hold Eliquis  for 2 days prior to procedure   I also confirmed the patient resides in the state of [place] . As per [facility] Medical Board telemedicine laws, the patient must reside in the state in which the provider is licensed.   Damien JAYSON Braver, NP 05/24/2024, 1:28 PM [facility]

## 2024-05-24 NOTE — Telephone Encounter (Signed)
 Patient with diagnosis of afib on Eliquis  for anticoagulation.    Procedure: Thyroid  Surgery  Date of procedure: TBD   CHA2DS2-VASc Score = 7   This indicates a 11.2% annual risk of stroke. The patient's score is based upon: CHF History: 1 HTN History: 1 Diabetes History: 0 Stroke History: 2 Vascular Disease History: 1 Age Score: 2 Gender Score: 0      CrCl 30.4 ml/min Platelet count 270  Patient has not had an Afib/aflutter ablation within the last 3 months or DCCV within the last 30 days  Per office protocol, patient can hold Eliquis  for 2 days prior to procedure.    **This guidance is not considered finalized until pre-operative APP has relayed final recommendations.**

## 2024-05-25 ENCOUNTER — Telehealth: Payer: Self-pay

## 2024-05-25 NOTE — Progress Notes (Signed)
 Histology and Location of Primary Cancer: mets to bone   Location(s) of Symptomatic Metastases:    PET 04/29/2024:  Intensely hypermetabolic RIGHT thyroid  mass. Findings concerning for thyroid  cancer versus metastatic lesion. Favor primary thyroid  carcinoma.  Peripheral nodular thickening in the LEFT lower lobe with mild metabolic activity for size. Indeterminate lesion. Centrilobular emphysema noted.  Single hypermetabolic LEFT hilar lymph node. Multiple hypermetabolic skeletal metastasis. Differential include thyroid  carcinoma metastasis versus lung cancer metastasis. Favor thyroid  carcinoma.  Multiple hypermetabolic muscular metastasis. The lesion in the LEFT gluteus muscle would be amenable to biopsy.   Biopsy of Left Hip Mass 05/05/2024   Past/Anticipated chemotherapy by medical oncology, if any:   Pain on a scale of 0-10 is:     If Spine Met(s), symptoms, if any, include: Bowel/Bladder retention or incontinence (please describe):  Numbness or weakness in extremities (please describe):  Current Decadron  regimen, if applicable:   Ambulatory status? Walker? Wheelchair?:   SAFETY ISSUES: Prior radiation?  Pacemaker/ICD?  Possible current pregnancy? N/a Is the patient on methotrexate?   Current Complaints / other details:

## 2024-05-25 NOTE — Telephone Encounter (Signed)
 Notified pt daughter regarding FMLA form being incomplete due to pending work up/ no appt dates. Pt daughter is aware and said that she will resubmit for FMLA when appointment dates are set. No questions or concerns to be noted at this time,

## 2024-05-26 ENCOUNTER — Other Ambulatory Visit: Payer: Self-pay | Admitting: Physician Assistant

## 2024-05-26 ENCOUNTER — Encounter: Payer: Self-pay | Admitting: Radiation Oncology

## 2024-05-26 ENCOUNTER — Ambulatory Visit
Admission: RE | Admit: 2024-05-26 | Discharge: 2024-05-26 | Disposition: A | Discharge status: Home / Self Care | Source: Ambulatory Visit | Attending: Radiation Oncology | Admitting: Radiation Oncology

## 2024-05-26 ENCOUNTER — Encounter: Payer: Self-pay | Admitting: Physician Assistant

## 2024-05-26 VITALS — Ht 72.0 in | Wt 198.0 lb

## 2024-05-26 DIAGNOSIS — C61 Malignant neoplasm of prostate: Secondary | ICD-10-CM | POA: Diagnosis not present

## 2024-05-26 DIAGNOSIS — C7951 Secondary malignant neoplasm of bone: Secondary | ICD-10-CM | POA: Insufficient documentation

## 2024-05-26 MED ORDER — XTAMPZA ER 9 MG PO C12A
9.0000 mg | EXTENDED_RELEASE_CAPSULE | Freq: Two times a day (BID) | ORAL | 0 refills | Status: DC | PRN
Start: 2024-05-26 — End: 2024-07-21

## 2024-05-26 NOTE — Addendum Note (Signed)
 Encounter addended by: Lanell Donald Stagger, PA-C on: 05/26/2024 11:53 AM  Actions taken: Clinical Note Signed

## 2024-05-26 NOTE — Progress Notes (Addendum)
 Radiation Oncology         [phone]) [phone] ________________________________  Name: Joshua Compton.        MRN: [id number]  Date of Service: 05/26/2024 DOB: Mar 28, 1941  RR:Dujrxd, Butler, MD  Joshua Norleen [name], MD     REFERRING PHYSICIAN: Federico Norleen [name], MD   DIAGNOSIS: The primary encounter diagnosis was Secondary malignant neoplasm of bone (HCC). Diagnoses of Prostate cancer (HCC), Pain from bone metastases (HCC), and Cancer, metastatic to bone [facility]) were also pertinent to this visit.   HISTORY OF PRESENT ILLNESS: Joshua Buch. is a 83 y.o. male seen at the request of Joshua Compton and Joshua Compton, [facility] for a new diagnosis of metastatic adenocarcinoma which is concerning features for either thyroid  or other metastatic process. The patient has a history of prostate cancer treated in the early 2000s with radioactive seed implant and has been without disease with PSA  <.1 and is followed by Joshua Compton. The patient presented to the emergency room after complaining of pain in his chest and back for up to a month.  In the emergency room on 04/16/2024.  In retrospect he had scans in October 2024 that showed new subpleural nodular opacities in bilateral lobes that were suspicious but could be inflammatory.  In May during his ER visit a CT scan of the chest without contrast showed increased soft tissue density within paraseptal emphysema in the left lower lobe concerning for neoplasm and erosive lucency of his left posterior fourth rib possibly associated with nondisplaced rib fracture was noted and an increase in the conspicuity of the left lower lobe pulmonary nodules.  A CT of the thoracic spine that day showed no acute fracture or bone lesion but degenerative changes at T11-12.  A PET scan on 04/30/2023 showed intense hypermetabolic activity in the right thyroid  with associated mass measuring 4 cm in greatest dimension the SUV being 45.  A nodule in the left lower lobe measured 2.3 cm with an SUV  of 4.4, a single hypermetabolic left hilar lymph node had an SUV of 5.5, and hypermetabolic focus along the abdominal wall musculature superior to the right iliac bone had an SUV of 8.1 as well as hypermetabolic muscular lesion lateral to the left iliac bone with an SUV of 10.  Multiple hypermetabolic skeletal metastases were noted including along the left fourth rib with an SUV of 12.2, right lower rib anteriorly, and adjacent to the left lateral clavicle.  There was also metastatic appearing finding in the adductor muscles anterior to the left inferior pubic ramus.  He underwent an ultrasound of his thyroid  on 05/05/2024 which showed us  4.8 cm nodule in the right lobe but no evidence of adenopathy.  A fine-needle aspirate biopsy showed malignancy and this was felt to possibly represent a primary thyroid  malignancy but could not rule out metastatic disease.  A biopsy also of soft tissue mass along the left hip was consistent with metastatic adenocarcinoma based on the pattern of immunoreactivity sites of origin could include pancreatic, biliary or GI tract.  He underwent colonoscopy and EGD this week with Joshua Compton.  His procedure was performed on 05/23/2024 and the esophagus was normal in appearance and a single gastric polyp was appreciated resected and retrieved.  Colonoscopy showed a 5 mm polyp in the cecum resected and retrieved, and 2 polyps measuring 2 to 4 mm in the rectum and sigmoid colon were also noted.  Pathology is pending at this time.  He is planning to  have an excisional biopsy with Joshua Compton to obtain more thyroid  tissue to try and determine primary site.  He is seen to consider palliative radiation for his painful sites of disease while he is still in the midst of his workup.    PREVIOUS RADIATION THERAPY:    In the early 2000s The patient reports radioactive seed implant for prostate cancer, details unknown  PAST MEDICAL HISTORY:  Past Medical History:  Diagnosis Date   Acute  heart failure with preserved ejection fraction (HFpEF) (HCC) 09/05/2023   Acute HFrEF (heart failure with reduced ejection fraction) (HCC) 09/08/2023   Acute respiratory failure with hypoxia (HCC) 09/05/2023   Arthritis    Cancer (HCC)    prostate ca   seed implants   CHF (congestive heart failure) (HCC)    Clotting disorder (HCC)    GERD (gastroesophageal reflux disease)    Glaucoma    Hyperlipidemia    Hypertension    dr Joshua Compton     [place]   fm   Stroke [facility]) 07/2023       PAST SURGICAL HISTORY: Past Surgical History:  Procedure Laterality Date   BACK SURGERY     x2   COLONOSCOPY     HERNIA REPAIR  11-06-2009   left inguinal repair    NECK SURGERY     ROTATOR CUFF REPAIR Left jan 2016   TOTAL HIP ARTHROPLASTY Right 04/20/2020   Procedure: RIGHT TOTAL HIP ARTHROPLASTY ANTERIOR APPROACH;  Surgeon: Joshua Kay HERO, MD;  Location: [facility] OR;  Service: Orthopedics;  Laterality: Right;   TOTAL HIP ARTHROPLASTY Left 01/07/2021   Procedure: LEFT TOTAL HIP ARTHROPLASTY ANTERIOR APPROACH;  Surgeon: Joshua Kay HERO, MD;  Location: [facility] OR;  Service: Orthopedics;  Laterality: Left;   TRANSURETHRAL RESECTION OF PROSTATE       FAMILY HISTORY:  Family History  Problem Relation Age of Onset   Cancer Mother        breast   Heart disease Father    Cancer Brother    Cancer Brother    Colon cancer Neg Hx    Rectal cancer Neg Hx    Stomach cancer Neg Hx      SOCIAL HISTORY:  reports that he quit smoking about 19 years ago. His smoking use included cigarettes. He started smoking about 49 years ago. He has a 15 pack-year smoking history. He has never used smokeless tobacco. He reports that he does not drink alcohol and does not use drugs.  The patient is married.  His wife dementia and he is her caregiver.  They have a daughter, Joshua Compton who joins on the call and also provides care to her parents.   ALLERGIES: Iodinated contrast media and Pravastatin    MEDICATIONS:  Current Outpatient  Medications  Medication Sig Dispense Refill   acetaminophen  (TYLENOL ) 500 MG tablet Take 1,000 mg by mouth every 6 (six) hours as needed for mild pain (pain score 1-3).     amiodarone  (PACERONE ) 200 MG tablet Take 1 tablet (200 mg total) by mouth daily. 90 tablet 3   apixaban  (ELIQUIS ) 2.5 MG TABS tablet Take 1 tablet (2.5 mg total) by mouth 2 (two) times daily. 60 tablet 11   atorvastatin  (LIPITOR) 40 MG tablet Take 1 tablet (40 mg total) by mouth daily. 90 tablet 1   cholecalciferol (VITAMIN D3) 25 MCG (1000 UNIT) tablet TAKE 1 TABLET BY MOUTH EVERY DAY 90 tablet 1   dapagliflozin  propanediol (FARXIGA ) 10 MG TABS tablet Take 1 tablet (10 mg total)  by mouth daily. 90 tablet 3   dorzolamide -timolol  (COSOPT ) 22.3-6.8 MG/ML ophthalmic solution Place 1 drop into both eyes daily.      fluticasone  (FLONASE ) 50 MCG/ACT nasal spray SPRAY 2 SPRAYS INTO EACH NOSTRIL EVERY DAY (Patient taking differently: Place 2 sprays into both nostrils daily as needed for allergies.) 48 mL 2   Fluticasone -Umeclidin-Vilant (TRELEGY ELLIPTA ) 100-62.5-25 MCG/ACT AEPB Inhale 1 puff into the lungs daily. 28 each 5   metoprolol  succinate (TOPROL -XL) 25 MG 24 hr tablet TAKE 0.5 TABLETS (12.5 MG TOTAL) BY MOUTH DAILY. FOR BLOOD PRESSURE CONTROL 45 tablet 1   Oxycodone  HCl 10 MG TABS Take 1 tablet (10 mg total) by mouth every 6 (six) hours as needed for up to 120 doses. 120 tablet 0   pantoprazole  (PROTONIX ) 40 MG tablet Take 1 tablet (40 mg total) by mouth daily. For heartburn 90 tablet 3   potassium chloride  SA (KLOR-CON  M) 20 MEQ tablet TAKE 1 TABLET (20 MEQ TOTAL) BY MOUTH DAILY. AS A POTASSIUM SUPPLEMENT 90 tablet 1   rOPINIRole  (REQUIP ) 1 MG tablet Take 1 tablet (1 mg total) by mouth 3 (three) times daily. Plus an extra tablet at bedtime to total four a day 360 tablet 3   sacubitril -valsartan  (ENTRESTO ) 24-26 MG Take 1 tablet by mouth 2 (two) times daily. 60 tablet 3   torsemide  (DEMADEX ) 20 MG tablet TAKE 2 TABLETS (40 MG  TOTAL) BY MOUTH DAILY. 180 tablet 0   oxyCODONE  (OXYCONTIN ) 10 mg 12 hr tablet Take 1 tablet (10 mg total) by mouth every 12 (twelve) hours. (Patient not taking: Reported on 05/26/2024) 60 tablet 0   Current Facility-Administered Medications  Medication Dose Route Frequency Provider Last Rate Last Admin   betamethasone  acetate-betamethasone  sodium phosphate  (CELESTONE ) injection 6 mg  6 mg Intramuscular Once          REVIEW OF SYSTEMS: On review of systems, the patient reports that for at least a month and a half he has been experiencing terrible pain in the chest wall and mid back.  He states that he has been using oxycodone  every 6 hours and taking 10 mg at a time, this has not alleviated his symptoms and a new prescription was sent in for oxycodone  long-acting but his pharmacy has not filled this yet.  He is unable to take NSAIDs due to his Eliquis  and kidney function.  The pain is gnawing in nature shooting at times, worse at night.  No other complaints are verbalized.     PHYSICAL EXAM:     In general this is a tired appearing elderly male in no acute distress. He's alert and oriented x4 and appropriate throughout the examination. Cardiopulmonary assessment is negative for acute distress and he exhibits normal effort.     ECOG = 2  0 - Asymptomatic (Fully active, able to carry on all predisease activities without restriction)  1 - Symptomatic but completely ambulatory (Restricted in physically strenuous activity but ambulatory and able to carry out work of a light or sedentary nature. For example, light housework, office work)  2 - Symptomatic, <50% in bed during the day (Ambulatory and capable of all self care but unable to carry out any work activities. Up and about more than 50% of waking hours)  3 - Symptomatic, >50% in bed, but not bedbound (Capable of only limited self-care, confined to bed or chair 50% or more of waking hours)  4 - Bedbound (Completely disabled. Cannot carry  on any self-care. Totally confined to bed  or chair)  5 - Death   Raylene MM, Creech RH, Tormey DC, et al. [phone]). Toxicity and response criteria of the [facility]. Am. DOROTHA Bridges. Oncol. 5 (6): 649-55    LABORATORY DATA:  Lab Results  Component Value Date   WBC 10.2 04/20/2024   HGB 13.6 04/20/2024   HCT 41.9 04/20/2024   MCV 92.9 04/20/2024   PLT 270 04/20/2024   Lab Results  Component Value Date   NA 143 04/20/2024   K 4.3 04/20/2024   CL 104 04/20/2024   CO2 32 04/20/2024   Lab Results  Component Value Date   ALT 20 04/20/2024   AST 18 04/20/2024   ALKPHOS 106 04/20/2024   BILITOT 0.8 04/20/2024      RADIOGRAPHY: US  CORE BIOPSY (SOFT TISSUE) Result Date: 05/05/2024 INDICATION: left pelvic mass. Hypermetabolic subcutaneous/intramuscular mass of the LEFT pelvis EXAM: ULTRASOUND-GUIDED LEFT PELVIC SUBCUTANEOUS MASS BIOPSY COMPARISON:  PET-CT, 04/29/2024 MEDICATIONS: None ANESTHESIA/SEDATION: Local anesthetic was administered. COMPLICATIONS: None immediate. TECHNIQUE: Informed written consent was obtained from the patient and/or patient's representative after a discussion of the risks, benefits and alternatives to treatment. Questions regarding the procedure were encouraged and answered. Initial ultrasound scanning demonstrated a hypoechoic subcutaneous mass at the LEFT pelvis, adjacent to the iliac crest and within the gluteus medius muscle, measuring approximately 3.7 x 2.5 x 1.5 cm. An ultrasound image was saved for documentation purposes. The procedure was planned. A timeout was performed prior to the initiation of the procedure. The operative was prepped and draped in the usual sterile fashion, and a sterile drape was applied covering the operative field. A timeout was performed prior to the initiation of the procedure. Local anesthesia was provided with 1% lidocaine  with epinephrine . Under direct ultrasound guidance, an 18 gauge core needle device was  utilized to obtain to obtain 4 core needle biopsies of the LEFT pelvic mass. The samples were placed in saline and submitted to pathology. The needle was removed and superficial hemostasis was achieved with manual compression. Post procedure scan was negative for significant hematoma. A dressing was applied. The patient tolerated the procedure well without immediate postprocedural complication. IMPRESSION: Successful ultrasound guided core biopsy of a LEFT pelvic subcutaneous/intramuscular mass. Thom Hall, MD [facility] Electronically Signed   By: Thom Hall M.D.   On: 05/05/2024 14:34   US  FNA BX THYROID  1ST LESION AFIRMA Result Date: 05/05/2024 INDICATION: right thyroid  nodule, hyermetabolic on PET scan Indeterminate thyroid  nodule EXAM: ULTRASOUND GUIDED FINE NEEDLE ASPIRATION OF INDETERMINATE RIGHT THYROID  NODULE COMPARISON:  US  THYROID  - 05/05/24 MEDICATIONS: 1% lidocaine , 2 mL. COMPLICATIONS: None immediate. TECHNIQUE: Informed written consent was obtained from the patient after a discussion of the risks, benefits and alternatives to treatment. Questions regarding the procedure were encouraged and answered. A timeout was performed prior to the initiation of the procedure. Pre-procedural ultrasound scanning demonstrated unchanged size and appearance of the indeterminate nodule within the right, inferior thyroid  lobe. The procedure was planned. The neck was prepped in the usual sterile fashion, and a sterile drape was applied covering the operative field. A timeout was performed prior to the initiation of the procedure. Local anesthesia was provided with 1% lidocaine . Under direct ultrasound guidance, 5 FNA biopsies were performed of the right, inferior nodule with a 25 gauge needle. Multiple ultrasound images were saved for procedural documentation purposes. The samples were prepared and submitted to pathology. Limited post procedural scanning  was negative for hematoma or additional complication. Dressings  were placed. The patient tolerated the above procedures procedure well without immediate postprocedural complication. FINDINGS: FINDINGS Nodule reference number based on prior diagnostic ultrasound: 1 Maximum size: 4.8 cm Location: Right;  inferior ACR TI-RADS risk category:  TR4 Reason for biopsy: meets ACR TI-RADS criteria Ultrasound imaging confirms appropriate placement of the needles within the thyroid  nodule. IMPRESSION: Successful ultrasound guided FNA biopsy of a 4.8 cm RIGHT inferior TR-4 thyroid  nodule. Procedure performed by: Sherrilee Bal, PA-C under the supervision of Dr. JINNY Hall Electronically Signed   By: Thom Hall M.D.   On: 05/05/2024 14:25   US  THYROID  Result Date: 05/05/2024 CLINICAL DATA:  Incidental on PET. right thyroid  nodule, hyermetabolic on PET scan Prior RIGHT thyroid  nodule biopsy with a benign result 06/18/2017. EXAM: THYROID  ULTRASOUND TECHNIQUE: Ultrasound examination of the thyroid  gland and adjacent soft tissues was performed. COMPARISON:  PET-CT, 04/29/2024.  US  THYROID , 06/19/2021 FINDINGS: Parenchymal Echotexture: Mildly heterogenous Isthmus: 0.5 cm Right lobe: 6.9 x 3.9 x 4.2 cm Left lobe: 5.4 x 1.9 x 2.4 cm _________________________________________________________ Estimated total number of nodules >/= 1 cm: 1 Number of spongiform nodules >/=  2 cm not described below (TR1): 0 Number of mixed cystic and solid nodules >/= 1.5 cm not described below (TR2): 0 _________________________________________________________ Nodule # 1: Location: RIGHT; Inferior Maximum size: 4.8 cm; Other 2 dimensions: 4.2 x 3.7 cm, previously 3.4 x 3.0 x 2.7 cm on 06/19/2021 ultrasound Composition: solid/almost completely solid (2) Echogenicity: hypoechoic (2) Shape: not taller-than-wide (0) Margins: ill-defined (0) Echogenic foci: none (0) ACR TI-RADS total points: 4. ACR TI-RADS risk category: TR4 (4-6 points). ACR TI-RADS  recommendations: **Given size (>/= 1.5 cm) and appearance, fine needle aspiration of this moderately suspicious nodule should be considered based on TI-RADS criteria. _________________________________________________________ No cervical adenopathy or abnormal fluid collection within the imaged neck. IMPRESSION: Solitary, dominant 4.8 cm RIGHT inferior TR-4 thyroid  nodule. This likely correlates with the nodule seen on corresponding PET CT. FNA biopsy of this moderately suspicious nodule should be considered based on TI-RADS criteria. The above is in keeping with the ACR TI-RADS recommendations - J Am Coll Radiol 2017;14:587-595. Electronically Signed   By: Thom Hall M.D.   On: 05/05/2024 11:05   NM PET Image Initial (PI) Skull Base To Thigh Result Date: 04/29/2024 CLINICAL DATA:  Initial treatment strategy for bone metastasis. EXAM: NUCLEAR MEDICINE PET SKULL BASE TO THIGH TECHNIQUE: 9.6 mCi F-18 FDG was injected intravenously. Full-ring PET imaging was performed from the skull base to thigh after the radiotracer. CT data was obtained and used for attenuation correction and anatomic localization. Fasting blood glucose: 86 mg/dl COMPARISON:  None Available. FINDINGS: NECK: Intense metabolic activity within the RIGHT thyroid  mass. Mass measures 4 cm has soft tissue attenuation centrally with intense metabolic activity (SUV max equal 45 (image 55. Incidental CT findings: None. CHEST: peripheral nodular thickening in the LEFT lower lobe measures 2.3 cm has mild metabolic activity for size with SUV max equal 4.4 on image 98. Single small hypermetabolic LEFT hilar lymph node with SUV max equal 5.5 on image 90. Incidental CT findings: Centrilobular emphysema. ABDOMEN/PELVIS: 1hypermetabolic focus along the abdominal wall musculature superior to the RIGHT iliac bone with SUV max equal 8.1 on image 155 No abnormal metabolic activity in the liver. Adrenal glands are normal. Hypermetabolic muscular lesion lateral to the  LEFT iliac bone on image 170 with SUV max equal 10.0. lesion resides in the gluteus muscle and subtly perceptible. Incidental CT findings: None. SKELETON: Multiple intensely hypermetabolic skeletal  metastasis. Posterior expansile LEFT fourth rib lesion with SUV max equal 12.2. Anterior RIGHT lower rib lesion on image 63. Lesion adjacent the LEFT lateral clavicle is favored within the soft tissue muscles (image 57) Bone lesion in the LEFT iliac bone on image 68. Single muscular metastasis in the adductor muscles anterior to the LEFT inferior pubic ramus Incidental CT findings: None. IMPRESSION: 1. Intensely hypermetabolic RIGHT thyroid  mass. Findings concerning for thyroid  cancer versus metastatic lesion. Favor primary thyroid  carcinoma. 2. Peripheral nodular thickening in the LEFT lower lobe with mild metabolic activity for size. Indeterminate lesion. Centrilobular emphysema noted. 3. Single hypermetabolic LEFT hilar lymph node. 4. Multiple hypermetabolic skeletal metastasis. Differential include thyroid  carcinoma metastasis versus lung cancer metastasis. Favor thyroid  carcinoma. 5. Multiple hypermetabolic muscular metastasis. The lesion in the LEFT gluteus muscle would be amenable to biopsy. 6.  Emphysema (ICD10-J43.9). Electronically Signed   By: Jackquline Boxer M.D.   On: 04/29/2024 18:01       IMPRESSION/PLAN: 1. Metastatic adenocarcinoma of unknown primary.  The patient is still in the midst of his workup, but he has tissue confirmation of metastatic disease involving the bones and muscular lesions.  After discussing the patient's case and his symptoms, it appears that he is most symptomatic in the mid thoracic spine and rib cage.  Dr. Dewey would offer a palliative course of radiation over 2 weeks and would outline sites for treatment with wiring these locations of pain at the time of his simulation.  We discussed the risks, benefits, short and long-term effects of radiotherapy.  The patient is  interested in proceeding and will come for simulation tomorrow at which time he will sign written consent to proceed.  He will also continue his plans for excisional biopsy of the thyroid  with Joshua Compton, and follow-up with Joshua Compton to discuss recommendations for systemic therapy.  We expect to start his treatment next Monday. 2. Pain secondary to #1.  The patient has not received a call from his pharmacy about his long-acting narcotics being filled yet, I encouraged his daughter to try and reach out about this today.  If they are having any trouble with getting this they will let us  know.  Unfortunately he is not able to take NSAIDs due to ongoing Eliquis  and his kidney function.  We discussed that we anticipate improvement of his pain with radiation but that it can take sometimes the entirety of the course before that relief is noticeable.  We will continue to support him and offer adjustments as needed to his medication.  This encounter was provided by telemedicine platform MyChart.  The patient has provided two factor identification and has given verbal consent for this type of encounter and has been advised to only accept a meeting of this type in a secure network environment. The time spent during this encounter was 60 minutes including preparation, discussion, and coordination of the patient's care. The attendants for this meeting include Sharene Cary, RN, Dr. Dewey, Donald Estefana Husband  and Joshua Compton., and his daughter Joshua Compton. During the encounter,  Sharene Cary, RN, Dr. Dewey, and Donald Estefana Husband were located at [facility] Radiation Oncology Department.  Joshua Compton. was located at home with his daughter Joshua Compton    The above documentation reflects my direct findings during this shared patient visit. Please see the separate note by Dr. Dewey on this date for the remainder of the patient's plan of care.    Donald KYM Husband,  PAC   **Disclaimer: This note was dictated with voice recognition software. Similar sounding words can inadvertently be transcribed and this note may contain transcription errors which may not have been corrected upon publication of note.**

## 2024-05-27 ENCOUNTER — Ambulatory Visit: Attending: Internal Medicine

## 2024-05-27 ENCOUNTER — Ambulatory Visit
Admission: RE | Admit: 2024-05-27 | Discharge: 2024-05-27 | Disposition: A | Discharge status: Home / Self Care | Source: Ambulatory Visit | Attending: Radiation Oncology | Admitting: Radiation Oncology

## 2024-05-27 DIAGNOSIS — Z510 Encounter for antineoplastic radiation therapy: Secondary | ICD-10-CM | POA: Diagnosis not present

## 2024-05-27 DIAGNOSIS — C61 Malignant neoplasm of prostate: Secondary | ICD-10-CM | POA: Diagnosis not present

## 2024-05-27 DIAGNOSIS — C801 Malignant (primary) neoplasm, unspecified: Secondary | ICD-10-CM | POA: Diagnosis not present

## 2024-05-27 DIAGNOSIS — Z01810 Encounter for preprocedural cardiovascular examination: Secondary | ICD-10-CM | POA: Diagnosis not present

## 2024-05-27 DIAGNOSIS — C7951 Secondary malignant neoplasm of bone: Secondary | ICD-10-CM | POA: Diagnosis not present

## 2024-05-27 LAB — SURGICAL PATHOLOGY

## 2024-05-27 NOTE — Progress Notes (Signed)
 Virtual Visit via Telephone Note   Because of Mable Lashley. co-morbid illnesses, he is at least at moderate risk for complications without adequate follow up.  This format is felt to be most appropriate for this patient at this time.  Due to technical limitations with video connection (technology), today's appointment will be conducted as an audio only telehealth visit, and Claborn Janusz. verbally agreed to proceed in this manner.   All issues noted in this document were discussed and addressed.  No physical exam could be performed with this format.  Evaluation Performed:  Preoperative cardiovascular risk assessment _____________   Date:  05/27/2024   Patient ID:  Joshua Vicci Raddle., DOB 10/20/1941, MRN [id number] Patient Location:  Home Provider location:   Office  Primary Care Provider:  Zollie Lowers, MD Primary Cardiologist:  Alvan Carrier, MD  Chief Complaint / Patient Profile  83 y.o. y/o male with a h/o PAF on chronic anticoagulation, CVA, HFmrEF, hyperlipidemia, hypertension who is pending thyroid  surgery with Dr. Krystal Spinner and presents today for telephonic preoperative cardiovascular risk assessment. History of Present Illness  Joshua Rood. is a 83 y.o. male who presents via audio/video conferencing for a telehealth visit today.  Pt was last seen in cardiology clinic on 02/25/24 by Dr. Alvan.  At that time Joshua Khim. was doing well .  The patient is now pending procedure as outlined above. Since his last visit, he has remained stable from a cardiac standpoint. Today he denies chest pain, shortness of breath, lower extremity edema, fatigue, palpitations, melena, hematuria, hemoptysis, diaphoresis, weakness, presyncope, syncope, orthopnea, and PND. Patient is able to achieve greater than 4 METS of activity.  Past Medical History    Past Medical History:  Diagnosis Date   Acute heart failure with preserved ejection fraction (HFpEF) (HCC)  09/05/2023   Acute HFrEF (heart failure with reduced ejection fraction) (HCC) 09/08/2023   Acute respiratory failure with hypoxia (HCC) 09/05/2023   Arthritis    Cancer (HCC)    prostate ca   seed implants   CHF (congestive heart failure) (HCC)    Clotting disorder (HCC)    GERD (gastroesophageal reflux disease)    Glaucoma    Hyperlipidemia    Hypertension    dr cyrena     [facility]   fm   Stroke [facility]) 07/2023   Past Surgical History:  Procedure Laterality Date   BACK SURGERY     x2   COLONOSCOPY     HERNIA REPAIR  11-06-2009   left inguinal repair    NECK SURGERY     ROTATOR CUFF REPAIR Left jan 2016   TOTAL HIP ARTHROPLASTY Right 04/20/2020   Procedure: RIGHT TOTAL HIP ARTHROPLASTY ANTERIOR APPROACH;  Surgeon: Jerri Kay HERO, MD;  Location: [facility] OR;  Service: Orthopedics;  Laterality: Right;   TOTAL HIP ARTHROPLASTY Left 01/07/2021   Procedure: LEFT TOTAL HIP ARTHROPLASTY ANTERIOR APPROACH;  Surgeon: Jerri Kay HERO, MD;  Location: [facility] OR;  Service: Orthopedics;  Laterality: Left;   TRANSURETHRAL RESECTION OF PROSTATE     Allergies Allergies  Allergen Reactions   Iodinated Contrast Media Anaphylaxis    Tongue swelled up when he had MRI in [facility]  Intubated for airway protection after oral and throat swelling   Pravastatin  Other (See Comments)    Muscle aches   Home Medications    Prior to Admission medications   Medication Sig Start Date End Date Taking? Authorizing Provider  acetaminophen  (TYLENOL ) 500 MG tablet Take 1,000 mg  by mouth every 6 (six) hours as needed for mild pain (pain score 1-3).    [provider]  amiodarone  (PACERONE ) 200 MG tablet Take 1 tablet (200 mg total) by mouth daily. 02/25/24   Alvan Dorn FALCON, MD  apixaban  (ELIQUIS ) 2.5 MG TABS tablet Take 1 tablet (2.5 mg total) by mouth 2 (two) times daily. 02/25/24   Alvan Dorn FALCON, MD  atorvastatin  (LIPITOR) 40 MG tablet Take 1 tablet (40 mg total) by mouth daily. 02/18/24   Zollie Lowers, MD   cholecalciferol (VITAMIN D3) 25 MCG (1000 UNIT) tablet TAKE 1 TABLET BY MOUTH EVERY DAY 02/16/24   Zollie Lowers, MD  dapagliflozin  propanediol (FARXIGA ) 10 MG TABS tablet Take 1 tablet (10 mg total) by mouth daily. 11/30/23   Zollie Lowers, MD  dorzolamide -timolol  (COSOPT ) 22.3-6.8 MG/ML ophthalmic solution Place 1 drop into both eyes daily.  08/04/16   [provider]  fluticasone  (FLONASE ) 50 MCG/ACT nasal spray SPRAY 2 SPRAYS INTO EACH NOSTRIL EVERY DAY Patient taking differently: Place 2 sprays into both nostrils daily as needed for allergies. 09/29/23   Zollie Lowers, MD  Fluticasone -Umeclidin-Vilant (TRELEGY ELLIPTA ) 100-62.5-25 MCG/ACT AEPB Inhale 1 puff into the lungs daily. 01/22/24   Jude Harden GAILS, MD  metoprolol  succinate (TOPROL -XL) 25 MG 24 hr tablet TAKE 0.5 TABLETS (12.5 MG TOTAL) BY MOUTH DAILY. FOR BLOOD PRESSURE CONTROL 01/07/24   Alvan Dorn FALCON, MD  oxyCODONE  (OXYCONTIN ) 10 mg 12 hr tablet Take 1 tablet (10 mg total) by mouth every 12 (twelve) hours. Patient not taking: Reported on 05/26/2024 05/19/24   Thayil, Irene T, PA-C  oxyCODONE  ER (XTAMPZA  ER) 9 MG C12A Take 9 mg by mouth every 12 (twelve) hours as needed. 05/26/24   Thayil, Irene T, PA-C  Oxycodone  HCl 10 MG TABS Take 1 tablet (10 mg total) by mouth every 6 (six) hours as needed for up to 120 doses. 05/02/24   Thayil, Irene T, PA-C  pantoprazole  (PROTONIX ) 40 MG tablet Take 1 tablet (40 mg total) by mouth daily. For heartburn 07/28/23   Zollie Lowers, MD  potassium chloride  SA (KLOR-CON  M) 20 MEQ tablet TAKE 1 TABLET (20 MEQ TOTAL) BY MOUTH DAILY. AS A POTASSIUM SUPPLEMENT 03/21/24   Zollie Lowers, MD  rOPINIRole  (REQUIP ) 1 MG tablet Take 1 tablet (1 mg total) by mouth 3 (three) times daily. Plus an extra tablet at bedtime to total four a day 11/30/23   Zollie Lowers, MD  sacubitril -valsartan  (ENTRESTO ) 24-26 MG Take 1 tablet by mouth 2 (two) times daily. 04/05/24   Zollie Lowers, MD  torsemide  (DEMADEX ) 20 MG tablet  TAKE 2 TABLETS (40 MG TOTAL) BY MOUTH DAILY. 05/09/24   Zollie Lowers, MD   Physical Exam   Vital Signs:  Joshua Vicci Raddle. does not have vital signs available for review today. Given telephonic nature of communication, physical exam is limited. AAOx3. NAD. Normal affect.  Speech and respirations are unlabored. Accessory Clinical Findings  None Assessment & Plan  1.  Preoperative Cardiovascular Risk Assessment: Mr. Golubski perioperative risk of a major cardiac event is 11% according to the Revised Cardiac Risk Index (RCRI).   His functional capacity is good at 6.61 METs according to the [facility] Activity Status Index (DASI). Recommendations: According to ACC/AHA guidelines, no further cardiovascular testing needed.  The patient may proceed to surgery at acceptable risk.   Antiplatelet and/or Anticoagulation Recommendations: Eliquis  (Apixaban ) can be held for 2 days prior to surgery.  Please resume post op when felt to be safe.  The patient was advised that if he develops new symptoms prior to surgery to contact our office to arrange for a follow-up visit, and he verbalized understanding.  A copy of this note will be routed to requesting surgeon.  Time:   Today, I have spent 10 minutes with the patient with telehealth technology discussing medical history, symptoms, and management plan.    Joshua Aldea D Audry Kauzlarich, NP  05/27/2024, 12:20 PM

## 2024-05-30 ENCOUNTER — Ambulatory Visit (INDEPENDENT_AMBULATORY_CARE_PROVIDER_SITE_OTHER): Admitting: Family Medicine

## 2024-05-30 ENCOUNTER — Ambulatory Visit
Admission: RE | Admit: 2024-05-30 | Discharge: 2024-05-30 | Discharge status: Home / Self Care | Source: Ambulatory Visit | Attending: Radiation Oncology | Admitting: Radiation Oncology

## 2024-05-30 ENCOUNTER — Ambulatory Visit: Payer: Self-pay | Admitting: Gastroenterology

## 2024-05-30 ENCOUNTER — Other Ambulatory Visit: Payer: Self-pay

## 2024-05-30 ENCOUNTER — Encounter: Payer: Self-pay | Admitting: Family Medicine

## 2024-05-30 VITALS — BP 110/60 | HR 56 | Temp 97.5°F | Ht 72.0 in | Wt 192.2 lb

## 2024-05-30 DIAGNOSIS — R202 Paresthesia of skin: Secondary | ICD-10-CM

## 2024-05-30 DIAGNOSIS — Z510 Encounter for antineoplastic radiation therapy: Secondary | ICD-10-CM | POA: Diagnosis not present

## 2024-05-30 DIAGNOSIS — C61 Malignant neoplasm of prostate: Secondary | ICD-10-CM | POA: Diagnosis not present

## 2024-05-30 DIAGNOSIS — C7951 Secondary malignant neoplasm of bone: Secondary | ICD-10-CM | POA: Diagnosis not present

## 2024-05-30 LAB — RAD ONC ARIA SESSION SUMMARY
Course Elapsed Days: 0
Plan Fractions Treated to Date: 1
Plan Prescribed Dose Per Fraction: 3 Gy
Plan Total Fractions Prescribed: 10
Plan Total Prescribed Dose: 30 Gy
Reference Point Dosage Given to Date: 3 Gy
Reference Point Session Dosage Given: 3 Gy
Session Number: 1

## 2024-05-30 NOTE — Progress Notes (Signed)
 Phone: [phone]   Subjective:  Patient presents today to establish care.  Prior patient of [facility].  Chief Complaint  Patient presents with   Pain    Pt c/o new dx of cancer, has radiation today.   lump on face    See problem oriented charting  The following were reviewed and entered/updated in epic: Past Medical History:  Diagnosis Date   Acute heart failure with preserved ejection fraction (HFpEF) (HCC) 09/05/2023   Acute HFrEF (heart failure with reduced ejection fraction) (HCC) 09/08/2023   Acute respiratory failure with hypoxia (HCC) 09/05/2023   Allergy    Arthritis    Cancer (HCC)    prostate ca   seed implants   CHF (congestive heart failure) (HCC)    GERD (gastroesophageal reflux disease)    Glaucoma    Hyperlipidemia    Hypertension    dr cyrena     [facility]   fm   Stroke [facility]) 07/2023   Patient Active Problem List   Diagnosis Date Noted   Pain from bone metastases (HCC) 05/26/2024    Priority: High   Cancer, metastatic to bone (HCC) 05/26/2024    Priority: High   Centrilobular emphysema (HCC) 12/22/2023    Priority: High   PAF (paroxysmal atrial fibrillation) (HCC) 09/07/2023    Priority: High   Severe stage glaucoma 03/26/2012    Priority: High   History of prostate cancer 02/04/2011    Priority: High   Chronic renal impairment, stage 3b (HCC) 02/01/2024    Priority: Medium    Vitamin D  deficiency 09/18/2023    Priority: Medium    Cerebrovascular accident (CVA) due to embolism of left middle cerebral artery (HCC) 08/10/2023    Priority: Medium    Paresthesia of both feet 07/28/2023    Priority: Medium    Gastroesophageal reflux disease with esophagitis without hemorrhage 09/10/2021    Priority: Medium    Primary insomnia 09/10/2021    Priority: Medium    Leg cramps 12/11/2020    Priority: Medium    Hyperlipidemia     Priority: Medium    Venous insufficiency of both lower extremities 02/01/2024    Priority: Low   Vasculogenic erectile  dysfunction 09/10/2021    Priority: Low   Nuclear sclerosis 03/26/2012    Priority: Low   Status post total replacement of left hip 01/07/2021    Priority: 1.   Primary osteoarthritis of left hip 01/06/2021    Priority: 1.   Degenerative disc disease 02/04/2011    Priority: 1.   Past Surgical History:  Procedure Laterality Date   BACK SURGERY     x2   COLONOSCOPY     EYE SURGERY     cataract   HERNIA REPAIR  11/06/2009   left inguinal repair    NECK SURGERY     ROTATOR CUFF REPAIR Left 11/2014   TOTAL HIP ARTHROPLASTY Right 04/20/2020   Procedure: RIGHT TOTAL HIP ARTHROPLASTY ANTERIOR APPROACH;  Surgeon: Jerri Kay HERO, MD;  Location: [facility];  Service: Orthopedics;  Laterality: Right;   TOTAL HIP ARTHROPLASTY Left 01/07/2021   Procedure: LEFT TOTAL HIP ARTHROPLASTY ANTERIOR APPROACH;  Surgeon: Jerri Kay HERO, MD;  Location: [facility] OR;  Service: Orthopedics;  Laterality: Left;   TRANSURETHRAL RESECTION OF PROSTATE      Family History  Problem Relation Age of Onset   Cancer Mother        breast   Heart disease Father    Stroke Sister    Hypertension Sister  Healthy Sister    Heart attack Sister    Other Sister        unknown cause   Cancer Sister    Other Brother        multiple back surgeries   Hypertension Brother    Throat cancer Brother    Cancer Brother        groin related cancer   Heart attack Brother    Atrial fibrillation Daughter    Kassie Parkinson White syndrome Daughter    Cancer Son        luekimia   Heart disease Paternal Uncle    Other Brother        suicide-   Colon cancer Neg Hx    Rectal cancer Neg Hx    Stomach cancer Neg Hx     Medications- reviewed and updated Current Outpatient Medications  Medication Sig Dispense Refill   acetaminophen  (TYLENOL ) 500 MG tablet Take 1,000 mg by mouth every 6 (six) hours as needed for mild pain (pain score 1-3).     amiodarone  (PACERONE ) 200 MG tablet Take 1 tablet (200 mg total) by mouth daily. 90 tablet 3    apixaban  (ELIQUIS ) 2.5 MG TABS tablet Take 1 tablet (2.5 mg total) by mouth 2 (two) times daily. 60 tablet 11   atorvastatin  (LIPITOR) 40 MG tablet Take 1 tablet (40 mg total) by mouth daily. 90 tablet 1   cholecalciferol (VITAMIN D3) 25 MCG (1000 UNIT) tablet TAKE 1 TABLET BY MOUTH EVERY DAY 90 tablet 1   dapagliflozin  propanediol (FARXIGA ) 10 MG TABS tablet Take 1 tablet (10 mg total) by mouth daily. 90 tablet 3   dorzolamide -timolol  (COSOPT ) 22.3-6.8 MG/ML ophthalmic solution Place 1 drop into both eyes daily.      fluticasone  (FLONASE ) 50 MCG/ACT nasal spray SPRAY 2 SPRAYS INTO EACH NOSTRIL EVERY DAY 48 mL 2   Fluticasone -Umeclidin-Vilant (TRELEGY ELLIPTA ) 100-62.5-25 MCG/ACT AEPB Inhale 1 puff into the lungs daily. 28 each 5   metoprolol  succinate (TOPROL -XL) 25 MG 24 hr tablet TAKE 0.5 TABLETS (12.5 MG TOTAL) BY MOUTH DAILY. FOR BLOOD PRESSURE CONTROL 45 tablet 1   oxyCODONE  (OXYCONTIN ) 10 mg 12 hr tablet Take 1 tablet (10 mg total) by mouth every 12 (twelve) hours. 60 tablet 0   Oxycodone  HCl 10 MG TABS Take 1 tablet (10 mg total) by mouth every 6 (six) hours as needed for up to 120 doses. 120 tablet 0   pantoprazole  (PROTONIX ) 40 MG tablet Take 1 tablet (40 mg total) by mouth daily. For heartburn 90 tablet 3   potassium chloride  SA (KLOR-CON  M) 20 MEQ tablet TAKE 1 TABLET (20 MEQ TOTAL) BY MOUTH DAILY. AS A POTASSIUM SUPPLEMENT 90 tablet 1   rOPINIRole  (REQUIP ) 1 MG tablet Take 1 tablet (1 mg total) by mouth 3 (three) times daily. Plus an extra tablet at bedtime to total four a day 360 tablet 3   sacubitril -valsartan  (ENTRESTO ) 24-26 MG Take 1 tablet by mouth 2 (two) times daily. 60 tablet 3   torsemide  (DEMADEX ) 20 MG tablet TAKE 2 TABLETS (40 MG TOTAL) BY MOUTH DAILY. 180 tablet 0   oxyCODONE  ER (XTAMPZA  ER) 9 MG C12A Take 9 mg by mouth every 12 (twelve) hours as needed. (Patient not taking: Reported on 05/30/2024) 60 capsule 0   No current facility-administered medications for this  visit.    Allergies-reviewed and updated Allergies  Allergen Reactions   Iodinated Contrast Media Anaphylaxis    Tongue swelled up when he had MRI in [facility]  Intubated for airway protection after oral and throat swelling   Pravastatin  Other (See Comments)    Muscle aches    Social History   Social History Narrative   Married. 1 daughter and 1 son from current marriage. 3 daughters from prior marriage. 10 grandkids      Retired Proofreader: fishing, golf, enjoys being outdoors    Objective  Objective:  BP 110/60   Pulse (!) 56   Temp (!) 97.5 F (36.4 C)   Ht 6' (1.829 m)   Wt 192 lb 3.2 oz (87.2 kg)   SpO2 94%   BMI 26.07 kg/m  Gen: NAD, resting comfortably HEENT: Mucous membranes are moist. Oropharynx normal. TM normal. Eyes: sclera and lids normal, PERRLA Neck: no thyromegaly, no cervical lymphadenopathy CV:  slightly bradycardic,  no murmurs rubs or gallops Lungs: CTAB no crackles, wheeze, rhonchi Abdomen: soft/nontender/nondistended/normal bowel sounds. No rebound or guarding.  Ext: trace edema, weeping wound on left lower extremity- imprved from photographs shown Skin: warm, dry, on right chin 2 x 2 cm lesion    Assessment and Plan:   #spot on face 2 x 2 x 2 cm and feels fixed- popped up a week ago- did show Dr. Eletha- ? Cyst but deeper attachment- if continues to grow could refer to plastic surgery but priority now is adenocarcinoma owr[place]  # adenocarcinoma S:started with chest wall pain may 2025 early- eventually saw Emergency Department physician again on 04/16/24 and CT chest showed mass  On PET scan 04/29/24 -left lower lobe lesion, left hilar lymph node -thyroid  nodule- planned biopsy later date and on the hip-  -multiple hypermetabolic skeletal metastasis- thyroid  carcinoma vs lung cancer metastasis -also with potentially metastatic thyroid  nodule - unknown primary -requires oxycodone  for pain management-  both short and long acting. Long acting worsened pain.   -also had endoscopy and colonoscopy- largely reassuring with result note 05/23/24 and final results 05/30/24  Radiation planned today to help with pain while trying to work this up.  A/P: Adenocarcinoma still with unknown primary with upcoming biopsy of thyroid  nodule.  Also having radiation today for rib cage with ongoing significant pain-already on oxycodone  but not tolerating long-acting version-continue close follow-up with oncology team -CT lung biopsy also appears to be pending  # history of prostate cancer S:PSA remains at 0 in 2025- adenocarcinoma not related. Seed implant treatment.   -had turp prior to prostate cancer  A/P: Patient doing well-PSA recently of 0-continue to monitor at least yearly  # Atrial fibrillation- diagnosed September 2024- sees Dr. Alvan S: Rate controlled with amiodarone  200 mg daily Anticoagulated with  eliquis  2.5 mg BID A/P: appropriately anticoagulated and rate controlled- continue current medicine   CHF  with EF 45-50% in January 2025- sees Dr. Alvan- diagnosed September 2024 #hypertension #CKD III S: medication:  entresto  24-26 mg , torsemide  20 mg daily and takes potassium - Farxiga  10 mg daily -Only trace edema and no increase shortness of breath reported-baseline emphysema A/P: CHF appears euvolemic Hypertension well-controlled CKD stage III appears to possibly be progressing to stage IV-keep close eye on this-recheck at follow-up  #history of cerebrovascular accident- related to A fib #hyperlipidemia S: Medication: atorvastatin  40 mg, eliquis  so not on aspirin   -residual right hand weakness, dysarthria after stroke Lab Results  Component Value Date   CHOL 141 09/18/2023   HDL 62 09/18/2023   LDLCALC 66 09/18/2023   TRIG 60 09/18/2023   CHOLHDL  2.3 09/18/2023   A/P: Lipids well-controlled-continue current medicine  # Emphysema- sees Dr. Jude S:Medication:  trelegy  A/P: Reports  reasonable control-continue current medicine  # GERD S:Medication:  pantoprazole  40 mg daily A/P: Reports well-controlled-continue current medication   #Vitamin D  deficiency S: Medication: not taking A/P: Encouraged to restart his 1000 units vitamin D -recheck next visit  # Hyperglycemia/insulin resistance/prediabetes- a1c in 2024 6.4 S:  Medication: None Lab Results  Component Value Date   HGBA1C 5.6 07/28/2023   HGBA1C 6.4 (H) 01/28/2023    A/P: A1c improved at last visit-recheck in 6 months  #would care- right lower leg- working with wound care- also on left leg after scraping it.   #restless legs- on  [place]   and helpful 1 mg three times a day plus bedtime  # Health maintenance -Discussed next visit potentially getting final Shingrix  shot at pharmacy-for now focus on cancer workup and improved pain control   Recommended follow up: Return in about 6 months (around 11/30/2024) for physical or sooner if needed.Schedule b4 you leave. Future Appointments  Date Time Provider Department Center  05/30/2024  5:10 PM Dewey Rush, MD [facility] None  05/31/2024 10:45 AM [facility] [id number] [facility] None  06/01/2024 12:30 PM [facility] LINAC 3 [facility] None  06/02/2024 12:00 PM [facility] LINAC 3 [facility] None  06/03/2024  4:45 PM [facility] LINAC 4 [facility] None  06/06/2024 10:45 AM [facility] [id number] [facility] None  06/07/2024  5:45 PM [facility] LINAC 4 [facility] None  06/08/2024  9:45 AM [facility] [id number] [facility] None  06/09/2024  4:00 PM [facility] LINAC 3 [facility] None  06/10/2024  1:45 PM [facility] LINAC 3 [facility] None  06/30/2024  9:00 AM Branch, Dorn FALCON, MD [facility] [place] [facility]  06/30/2024  9:30 AM Rosan Harlene Fickle, DO [facility]  07/22/2024 10:00 AM [facility] WELLNESS VISIT [facility] None  12/09/2024  3:20 PM [name], MD [facility]    Return precautions advised. Garnette Katrinka, MD

## 2024-05-30 NOTE — Patient Instructions (Addendum)
 Health Maintenance Due  Topic Date Due   Medicare Annual Wellness (AWV)  01/15/2024  Ask them to cancel wellness visit with [facility] and reschedule with our nurse specialist  No labs today- no changes today  Pray the radiation treatment is helpful for you today!   Recommended follow up: Return in about 6 months (around 11/30/2024) for physical or sooner if needed.Schedule b4 you leave.

## 2024-05-31 ENCOUNTER — Ambulatory Visit
Admission: RE | Admit: 2024-05-31 | Discharge: 2024-05-31 | Disposition: A | Discharge status: Home / Self Care | Source: Ambulatory Visit | Attending: Radiation Oncology | Admitting: Radiation Oncology

## 2024-05-31 ENCOUNTER — Other Ambulatory Visit: Payer: Self-pay

## 2024-05-31 DIAGNOSIS — C61 Malignant neoplasm of prostate: Secondary | ICD-10-CM | POA: Diagnosis not present

## 2024-05-31 DIAGNOSIS — Z510 Encounter for antineoplastic radiation therapy: Secondary | ICD-10-CM | POA: Diagnosis not present

## 2024-05-31 DIAGNOSIS — C7951 Secondary malignant neoplasm of bone: Secondary | ICD-10-CM | POA: Diagnosis not present

## 2024-05-31 LAB — RAD ONC ARIA SESSION SUMMARY
Course Elapsed Days: 1
Plan Fractions Treated to Date: 2
Plan Prescribed Dose Per Fraction: 3 Gy
Plan Total Fractions Prescribed: 10
Plan Total Prescribed Dose: 30 Gy
Reference Point Dosage Given to Date: 6 Gy
Reference Point Session Dosage Given: 3 Gy
Session Number: 2

## 2024-06-01 ENCOUNTER — Ambulatory Visit
Admission: RE | Admit: 2024-06-01 | Discharge: 2024-06-01 | Disposition: A | Discharge status: Home / Self Care | Source: Ambulatory Visit | Attending: Radiation Oncology | Admitting: Radiation Oncology

## 2024-06-01 ENCOUNTER — Other Ambulatory Visit: Payer: Self-pay

## 2024-06-01 ENCOUNTER — Telehealth: Payer: Self-pay

## 2024-06-01 DIAGNOSIS — C61 Malignant neoplasm of prostate: Secondary | ICD-10-CM | POA: Diagnosis not present

## 2024-06-01 DIAGNOSIS — Z510 Encounter for antineoplastic radiation therapy: Secondary | ICD-10-CM | POA: Diagnosis not present

## 2024-06-01 DIAGNOSIS — C7951 Secondary malignant neoplasm of bone: Secondary | ICD-10-CM | POA: Diagnosis not present

## 2024-06-01 LAB — RAD ONC ARIA SESSION SUMMARY
Course Elapsed Days: 2
Plan Fractions Treated to Date: 3
Plan Prescribed Dose Per Fraction: 3 Gy
Plan Total Fractions Prescribed: 10
Plan Total Prescribed Dose: 30 Gy
Reference Point Dosage Given to Date: 9 Gy
Reference Point Session Dosage Given: 3 Gy
Session Number: 3

## 2024-06-01 NOTE — Telephone Encounter (Signed)
 Notified the pt daughter regarding her FMLA being completed,faxed, and confirmation received. Daughter verbalized understanding. Her copy was emailed upon as requested. No question or concerns to be noted at this time.

## 2024-06-02 ENCOUNTER — Ambulatory Visit: Payer: Self-pay | Admitting: Surgery

## 2024-06-02 ENCOUNTER — Encounter: Payer: Self-pay | Admitting: Oncology

## 2024-06-02 ENCOUNTER — Encounter: Payer: Self-pay | Admitting: Medical Oncology

## 2024-06-02 ENCOUNTER — Other Ambulatory Visit: Payer: Self-pay

## 2024-06-02 ENCOUNTER — Ambulatory Visit
Admission: RE | Admit: 2024-06-02 | Discharge: 2024-06-02 | Disposition: A | Discharge status: Home / Self Care | Source: Ambulatory Visit | Attending: Radiation Oncology

## 2024-06-02 ENCOUNTER — Ambulatory Visit
Admission: RE | Admit: 2024-06-02 | Discharge: 2024-06-02 | Disposition: A | Discharge status: Home / Self Care | Source: Ambulatory Visit | Attending: Radiation Oncology | Admitting: Radiation Oncology

## 2024-06-02 DIAGNOSIS — Z510 Encounter for antineoplastic radiation therapy: Secondary | ICD-10-CM | POA: Diagnosis not present

## 2024-06-02 DIAGNOSIS — C61 Malignant neoplasm of prostate: Secondary | ICD-10-CM | POA: Diagnosis not present

## 2024-06-02 DIAGNOSIS — C7951 Secondary malignant neoplasm of bone: Secondary | ICD-10-CM | POA: Diagnosis not present

## 2024-06-02 LAB — RAD ONC ARIA SESSION SUMMARY
Course Elapsed Days: 3
Plan Fractions Treated to Date: 4
Plan Prescribed Dose Per Fraction: 3 Gy
Plan Total Fractions Prescribed: 10
Plan Total Prescribed Dose: 30 Gy
Reference Point Dosage Given to Date: 12 Gy
Reference Point Session Dosage Given: 3 Gy
Session Number: 4

## 2024-06-02 NOTE — Progress Notes (Signed)
 [facility]  After review with PA-C, Johnston, follow up call to Dr. Ronold office to inform them that patient had cardiology clearance based on a 05/27/24 Telephone Visit for Preop cardiovascular exam. Office informed me they will review that visit and call patient to schedule biopsy. Dr. Ronold office to call and inform me when patient is scheduled. Office thanked.   Colene KYM Raider, RN, BSN, [facility] Oncology Nurse Navigator, [facility] 06/02/2024 10:00 AM

## 2024-06-02 NOTE — Progress Notes (Signed)
 [facility]  Call to Dr. Ronold office to inquire of status for patient's biopsy. Office informed me that office is waiting for patient to have cardiac clearance for scheduling of biopsy. Per Dr. Ronold office, patient is scheduled with Cardiac on 06/30/24. Office thanked.  PA-C Johnston informed of status for biopsy.   Joshua KYM Raider, RN, BSN, [facility] Oncology Nurse Navigator, [facility] 06/02/2024 9:26 AM

## 2024-06-03 ENCOUNTER — Ambulatory Visit

## 2024-06-03 ENCOUNTER — Ambulatory Visit
Admission: RE | Admit: 2024-06-03 | Discharge: 2024-06-03 | Disposition: A | Discharge status: Home / Self Care | Source: Ambulatory Visit | Attending: Radiation Oncology | Admitting: Radiation Oncology

## 2024-06-03 ENCOUNTER — Other Ambulatory Visit: Payer: Self-pay

## 2024-06-03 DIAGNOSIS — C7951 Secondary malignant neoplasm of bone: Secondary | ICD-10-CM | POA: Diagnosis not present

## 2024-06-03 DIAGNOSIS — Z510 Encounter for antineoplastic radiation therapy: Secondary | ICD-10-CM | POA: Diagnosis not present

## 2024-06-03 DIAGNOSIS — C61 Malignant neoplasm of prostate: Secondary | ICD-10-CM | POA: Diagnosis not present

## 2024-06-03 LAB — RAD ONC ARIA SESSION SUMMARY
Course Elapsed Days: 4
Plan Fractions Treated to Date: 5
Plan Prescribed Dose Per Fraction: 3 Gy
Plan Total Fractions Prescribed: 10
Plan Total Prescribed Dose: 30 Gy
Reference Point Dosage Given to Date: 15 Gy
Reference Point Session Dosage Given: 3 Gy
Session Number: 5

## 2024-06-05 ENCOUNTER — Encounter (HOSPITAL_COMMUNITY): Payer: Self-pay

## 2024-06-05 ENCOUNTER — Other Ambulatory Visit: Payer: Self-pay

## 2024-06-05 ENCOUNTER — Emergency Department (HOSPITAL_COMMUNITY)

## 2024-06-05 ENCOUNTER — Emergency Department (HOSPITAL_COMMUNITY)
Admission: EM | Admit: 2024-06-05 | Discharge: 2024-06-05 | Disposition: A | Discharge status: Home / Self Care | Attending: Emergency Medicine | Admitting: Emergency Medicine

## 2024-06-05 DIAGNOSIS — R079 Chest pain, unspecified: Secondary | ICD-10-CM | POA: Diagnosis not present

## 2024-06-05 DIAGNOSIS — Z7901 Long term (current) use of anticoagulants: Secondary | ICD-10-CM | POA: Diagnosis not present

## 2024-06-05 DIAGNOSIS — Z79899 Other long term (current) drug therapy: Secondary | ICD-10-CM | POA: Insufficient documentation

## 2024-06-05 DIAGNOSIS — J9859 Other diseases of mediastinum, not elsewhere classified: Secondary | ICD-10-CM | POA: Diagnosis not present

## 2024-06-05 DIAGNOSIS — R7989 Other specified abnormal findings of blood chemistry: Secondary | ICD-10-CM | POA: Diagnosis not present

## 2024-06-05 DIAGNOSIS — E041 Nontoxic single thyroid nodule: Secondary | ICD-10-CM | POA: Diagnosis not present

## 2024-06-05 DIAGNOSIS — Z8546 Personal history of malignant neoplasm of prostate: Secondary | ICD-10-CM | POA: Insufficient documentation

## 2024-06-05 DIAGNOSIS — I110 Hypertensive heart disease with heart failure: Secondary | ICD-10-CM | POA: Insufficient documentation

## 2024-06-05 DIAGNOSIS — C7951 Secondary malignant neoplasm of bone: Secondary | ICD-10-CM | POA: Diagnosis not present

## 2024-06-05 DIAGNOSIS — I509 Heart failure, unspecified: Secondary | ICD-10-CM | POA: Insufficient documentation

## 2024-06-05 DIAGNOSIS — R918 Other nonspecific abnormal finding of lung field: Secondary | ICD-10-CM | POA: Diagnosis not present

## 2024-06-05 DIAGNOSIS — Z7982 Long term (current) use of aspirin: Secondary | ICD-10-CM | POA: Diagnosis not present

## 2024-06-05 DIAGNOSIS — R0789 Other chest pain: Secondary | ICD-10-CM | POA: Diagnosis not present

## 2024-06-05 DIAGNOSIS — R778 Other specified abnormalities of plasma proteins: Secondary | ICD-10-CM | POA: Diagnosis not present

## 2024-06-05 DIAGNOSIS — C801 Malignant (primary) neoplasm, unspecified: Secondary | ICD-10-CM | POA: Diagnosis not present

## 2024-06-05 LAB — COMPREHENSIVE METABOLIC PANEL WITH GFR
ALT: 38 U/L (ref 0–44)
AST: 29 U/L (ref 15–41)
Albumin: 3.7 g/dL (ref 3.5–5.0)
Alkaline Phosphatase: 127 U/L — ABNORMAL HIGH (ref 38–126)
Anion gap: 11 (ref 5–15)
BUN: 24 mg/dL — ABNORMAL HIGH (ref 8–23)
CO2: 28 mmol/L (ref 22–32)
Calcium: 9.7 mg/dL (ref 8.9–10.3)
Chloride: 100 mmol/L (ref 98–111)
Creatinine, Ser: 1.82 mg/dL — ABNORMAL HIGH (ref 0.61–1.24)
GFR, Estimated: 36 mL/min — ABNORMAL LOW (ref >60)
Glucose, Bld: 100 mg/dL — ABNORMAL HIGH (ref 70–99)
Potassium: 4.9 mmol/L (ref 3.5–5.1)
Sodium: 139 mmol/L (ref 135–145)
Total Bilirubin: 0.9 mg/dL (ref 0.0–1.2)
Total Protein: 7.4 g/dL (ref 6.5–8.1)

## 2024-06-05 LAB — BRAIN NATRIURETIC PEPTIDE: B Natriuretic Peptide: 79.5 pg/mL (ref 0.0–100.0)

## 2024-06-05 LAB — CBC
HCT: 38.8 % — ABNORMAL LOW (ref 39.0–52.0)
Hemoglobin: 12.3 g/dL — ABNORMAL LOW (ref 13.0–17.0)
MCH: 30.1 pg (ref 26.0–34.0)
MCHC: 31.7 g/dL (ref 30.0–36.0)
MCV: 95.1 fL (ref 80.0–100.0)
Platelets: 236 K/uL (ref 150–400)
RBC: 4.08 MIL/uL — ABNORMAL LOW (ref 4.22–5.81)
RDW: 15.4 % (ref 11.5–15.5)
WBC: 4.8 K/uL (ref 4.0–10.5)
nRBC: 0 % (ref 0.0–0.2)

## 2024-06-05 LAB — D-DIMER, QUANTITATIVE: D-Dimer, Quant: >20 ug{FEU}/mL — ABNORMAL HIGH (ref 0.00–0.50)

## 2024-06-05 LAB — TROPONIN I (HIGH SENSITIVITY)
Troponin I (High Sensitivity): 65 ng/L — ABNORMAL HIGH (ref <18)
Troponin I (High Sensitivity): 70 ng/L — ABNORMAL HIGH (ref <18)

## 2024-06-05 MED ORDER — METHYLPREDNISOLONE SODIUM SUCC 40 MG IJ SOLR
40.0000 mg | Freq: Once | INTRAMUSCULAR | Status: AC
  Administered 2024-06-05: 40 mg via INTRAVENOUS
  Filled 2024-06-05: qty 1

## 2024-06-05 MED ORDER — IOHEXOL 350 MG/ML SOLN
75.0000 mL | Freq: Once | INTRAVENOUS | Status: AC | PRN
  Administered 2024-06-05: 75 mL via INTRAVENOUS

## 2024-06-05 MED ORDER — DIPHENHYDRAMINE HCL 25 MG PO CAPS
50.0000 mg | ORAL_CAPSULE | Freq: Once | ORAL | Status: AC
Start: 2024-06-05 — End: 2024-06-05
  Administered 2024-06-05: 50 mg via ORAL
  Filled 2024-06-05: qty 2

## 2024-06-05 MED ORDER — HYDROMORPHONE HCL 1 MG/ML IJ SOLN
0.5000 mg | Freq: Once | INTRAMUSCULAR | Status: AC
  Administered 2024-06-05: 0.5 mg via INTRAVENOUS
  Filled 2024-06-05: qty 1

## 2024-06-05 MED ORDER — DIPHENHYDRAMINE HCL 50 MG/ML IJ SOLN: 50.0000 mg | Freq: Once | INTRAMUSCULAR | Status: AC

## 2024-06-05 MED ORDER — ASPIRIN 81 MG PO CHEW
324.0000 mg | CHEWABLE_TABLET | Freq: Once | ORAL | Status: AC
  Administered 2024-06-05: 324 mg via ORAL
  Filled 2024-06-05: qty 4

## 2024-06-05 NOTE — ED Provider Notes (Signed)
 Joshua Compton   CSN: [id number] Arrival date & time: 06/05/24  1214     Patient presents with: Chest Pain   Joshua Pennella. is a 83 y.o. male.   HPI 83 year old male presents with chest and back pain.  History is from patient and family.  Patient has a history of heart failure, stroke, hypertension, hyperlipidemia.  He also has a history of prostate cancer and currently is being treated for an adenocarcinoma found a few months ago.  He is currently being treated with radiation which started about a week ago.  He has been dealing with chest and back pain for at least a month and a half since his last ED visit where he was found to have a rib fracture that seem to be cancer related.  However his chest and back pain have been getting worse substantially so for about a week since starting radiation.  He is taking narcotics without relief.  He does not feel like there is any shortness of breath.  No significant cough.  He does have some chronic leg swelling.  He takes Eliquis  for stroke though he did come off of it a couple weeks ago for endoscopy and colonoscopy. Pain is currently severe. Pain does not seem to radiate from chest to back, rather seems to be in 2 places at once.  Prior to Admission medications   Medication Sig Start Date End Date Taking? Authorizing Provider  acetaminophen  (TYLENOL ) 500 MG tablet Take 1,000 mg by mouth every 6 (six) hours as needed for mild pain (pain score 1-3).    [provider]  amiodarone  (PACERONE ) 200 MG tablet Take 1 tablet (200 mg total) by mouth daily. 02/25/24   Alvan Dorn FALCON, MD  apixaban  (ELIQUIS ) 2.5 MG TABS tablet Take 1 tablet (2.5 mg total) by mouth 2 (two) times daily. 02/25/24   Alvan Dorn FALCON, MD  atorvastatin  (LIPITOR) 40 MG tablet Take 1 tablet (40 mg total) by mouth daily. 02/18/24   Zollie Lowers, MD  cholecalciferol  (VITAMIN D3) 25 MCG (1000 UNIT) tablet TAKE 1  TABLET BY MOUTH EVERY DAY 02/16/24   Zollie Lowers, MD  dapagliflozin  propanediol (FARXIGA ) 10 MG TABS tablet Take 1 tablet (10 mg total) by mouth daily. 11/30/23   Zollie Lowers, MD  dorzolamide -timolol  (COSOPT ) 22.3-6.8 MG/ML ophthalmic solution Place 1 drop into both eyes daily.  08/04/16   [provider]  fluticasone  (FLONASE ) 50 MCG/ACT nasal spray SPRAY 2 SPRAYS INTO EACH NOSTRIL EVERY DAY 09/29/23   Zollie Lowers, MD  Fluticasone -Umeclidin-Vilant (TRELEGY ELLIPTA ) 100-62.5-25 MCG/ACT AEPB Inhale 1 puff into the lungs daily. 01/22/24   Jude Harden GAILS, MD  metoprolol  succinate (TOPROL -XL) 25 MG 24 hr tablet TAKE 0.5 TABLETS (12.5 MG TOTAL) BY MOUTH DAILY. FOR BLOOD PRESSURE CONTROL 01/07/24   Alvan Dorn FALCON, MD  oxyCODONE  (OXYCONTIN ) 10 mg 12 hr tablet Take 1 tablet (10 mg total) by mouth every 12 (twelve) hours. 05/19/24   Thayil, Irene T, PA-C  oxyCODONE  ER (XTAMPZA  ER) 9 MG C12A Take 9 mg by mouth every 12 (twelve) hours as needed. Patient not taking: Reported on 05/30/2024 05/26/24   Thayil, Irene T, PA-C  Oxycodone  HCl 10 MG TABS Take 1 tablet (10 mg total) by mouth every 6 (six) hours as needed for up to 120 doses. 05/02/24   Thayil, Irene T, PA-C  pantoprazole  (PROTONIX ) 40 MG tablet Take 1 tablet (40 mg total) by mouth daily. For heartburn 07/28/23  Zollie Lowers, MD  potassium chloride  SA (KLOR-CON  M) 20 MEQ tablet TAKE 1 TABLET (20 MEQ TOTAL) BY MOUTH DAILY. AS A POTASSIUM SUPPLEMENT 03/21/24   Zollie Lowers, MD  rOPINIRole  (REQUIP ) 1 MG tablet Take 1 tablet (1 mg total) by mouth 3 (three) times daily. Plus an extra tablet at bedtime to total four a day 11/30/23   Zollie Lowers, MD  sacubitril -valsartan  (ENTRESTO ) 24-26 MG Take 1 tablet by mouth 2 (two) times daily. 04/05/24   Zollie Lowers, MD  torsemide  (DEMADEX ) 20 MG tablet TAKE 2 TABLETS (40 MG TOTAL) BY MOUTH DAILY. 05/09/24   Zollie Lowers, MD    Allergies: Iodinated contrast media and Pravastatin     Review of Systems   Constitutional:  Negative for fever.  Respiratory:  Negative for shortness of breath.   Cardiovascular:  Positive for chest pain and leg swelling.  Gastrointestinal:  Negative for abdominal pain.  Musculoskeletal:  Positive for back pain.    Updated Vital Signs BP 133/74   Pulse 71   Temp 98.4 F (36.9 C) (Oral)   Resp 18   Ht 6' (1.829 m)   Wt 88 kg   SpO2 99%   BMI 26.31 kg/m   Physical Exam Vitals and nursing Compton reviewed.  Constitutional:      Appearance: He is well-developed. He is not ill-appearing or diaphoretic.  HENT:     Head: Normocephalic and atraumatic.  Cardiovascular:     Rate and Rhythm: Normal rate and regular rhythm.     Heart sounds: Normal heart sounds.  Pulmonary:     Effort: Pulmonary effort is normal.     Breath sounds: Normal breath sounds.  Chest:     Chest wall: No tenderness.  Abdominal:     Palpations: Abdomen is soft.     Tenderness: There is no abdominal tenderness.  Musculoskeletal:     Right lower leg: Edema present.     Left lower leg: Edema present.  Skin:    General: Skin is warm and dry.  Neurological:     Mental Status: He is alert.     (all labs ordered are listed, but only abnormal results are displayed) Labs Reviewed  CBC - Abnormal; Notable for the following components:      Result Value   RBC 4.08 (*)    Hemoglobin 12.3 (*)    HCT 38.8 (*)    All other components within normal limits  D-DIMER, QUANTITATIVE - Abnormal; Notable for the following components:   D-Dimer, Quant >20.00 (*)    All other components within normal limits  COMPREHENSIVE METABOLIC PANEL WITH GFR - Abnormal; Notable for the following components:   Glucose, Bld 100 (*)    BUN 24 (*)    Creatinine, Ser 1.82 (*)    Alkaline Phosphatase 127 (*)    GFR, Estimated 36 (*)    All other components within normal limits  TROPONIN I (HIGH SENSITIVITY) - Abnormal; Notable for the following components:   Troponin I (High Sensitivity) 65 (*)    All  other components within normal limits  TROPONIN I (HIGH SENSITIVITY) - Abnormal; Notable for the following components:   Troponin I (High Sensitivity) 70 (*)    All other components within normal limits  BRAIN NATRIURETIC PEPTIDE    EKG: EKG Interpretation Date/Time:  Sunday June 05 2024 12:54:56 EDT Ventricular Rate:  57 PR Interval:  184 QRS Duration:  94 QT Interval:  466 QTC Calculation: 453 R Axis:   62  Text Interpretation:  Sinus bradycardia no acute ST/T changes similar to May 2025 Confirmed by Freddi Hamilton [phone]) on 06/05/2024 3:03:29 PM  Radiology: CT Angio Chest PE W and/or Wo Contrast Result Date: 06/05/2024 EXAM: CTA of the Chest with contrast for PE 06/05/2024 07:47:26 PM TECHNIQUE: CTA of the chest was performed after the administration of intravenous contrast. Multiplanar reformatted images are provided for review. MIP images are provided for review. Automated exposure control, iterative reconstruction, and/or weight based adjustment of the mA/kV was utilized to reduce the radiation dose to as low as reasonably achievable. COMPARISON: PET/CT dated 04/29/2024. CLINICAL HISTORY: Pulmonary embolism (PE) suspected, low to intermediate prob, positive D-dimer. FINDINGS: PULMONARY ARTERIES: Pulmonary arteries are adequately opacified for evaluation. No evidence of pulmonary embolism. MEDIASTINUM: 3.6 cm right thyroid  mass, corresponding to the patient's biopsy proven thyroid  versus metastatic malignancy. Moderate 3-vessel coronary atherosclerosis. LYMPH NODES: No suspicious mediastinal lymphadenopathy on CT. LUNGS AND PLEURA: Moderate centrilobular and paraseptal emphysematous changes, upper lung predominant. Mild patchy posterior lower lobe opacities, likely atelectasis. No pneumothorax. UPPER ABDOMEN: Simple left renal cysts measuring up to 4.9 cm, benign (Bosniak 1). No follow up is recommended. SOFT TISSUES AND BONES: Lytic metastasis involving the left posterior fourth rib,  without pathologic fracture. Mild degenerative changes of the visualized thoracolumbar spine. AORTA: Although not tailored for evaluation of the thoracic aorta, there is no evidence of thoracic aortic aneurysm or dissection. Thoracic aortic atherosclerosis. IMPRESSION: 1. No evidence of pulmonary embolism. 2. 3.6 cm right thyroid  mass, corresponding to the patient's biopsy proven primary vs metastatic malignancy. 3. Lytic metastasis involving the left posterior fourth rib, without pathologic fracture. Electronically signed by: Pinkie Pebbles MD 06/05/2024 08:02 PM EDT RP Workstation: [id number]   DG Chest 2 View Result Date: 06/05/2024 CLINICAL DATA:  Chest pain EXAM: CHEST - 2 VIEW COMPARISON:  X-ray 04/16/2024. PET-CT 04/29/2024 FINDINGS: Hyperinflation. Chronic lung changes. No consolidation, pneumothorax or effusion. No edema. Normal cardiopericardial silhouette. Slight increased density to the extreme upper mediastinum. Please correlate for known history of enlarged thyroid  gland. There is a nodular density along the lower left hemithorax along the anterior aspect of the left fifth rib. IMPRESSION: Hyperinflation with chronic changes. Asymmetric density in the lower left thorax. Based on the prior PET-CT this could be an area of nodular opacity of the lung. Recommend follow-up. Electronically Signed   By: Ranell Bring M.D.   On: 06/05/2024 14:16     Procedures   Medications Ordered in the ED  aspirin  chewable tablet 324 mg (324 mg Oral Given 06/05/24 1535)  HYDROmorphone  (DILAUDID ) injection 0.5 mg (0.5 mg Intravenous Given 06/05/24 1534)  methylPREDNISolone  sodium succinate (SOLU-MEDROL ) 40 mg/mL injection 40 mg (40 mg Intravenous Given 06/05/24 1537)  diphenhydrAMINE  (BENADRYL ) capsule 50 mg (50 mg Oral Given 06/05/24 1907)    Or  diphenhydrAMINE  (BENADRYL ) injection 50 mg ( Intravenous See Alternative 06/05/24 1907)  iohexol  (OMNIPAQUE ) 350 MG/ML injection 75 mL (75 mLs Intravenous Contrast  Given 06/05/24 1948)                                    Medical Decision Making Amount and/or Complexity of Data Reviewed Labs: ordered.    Details: Troponin 65 then 70.  CKD similar to baseline Radiology: ordered and independent interpretation performed.    Details: No PE ECG/medicine tests: ordered and independent interpretation performed.    Details: No acute ST/T changes  Risk OTC drugs. Prescription drug management.  Patient presents with chest pain.  I suspect this is related to his posterior rib fracture that is probably being exacerbated by the radiation.  The pain has been present for several weeks.  D-dimer was sent in triage and given how high it is a CTA was obtained.  No PE seen.  The rib fractures seems similar.  Troponins are minimally elevated which has not been the case in the past though flat.  I suspect this chest pain is not cardiac but discussed we could consider admitting him for an echo and further cardiac monitoring.  However he declines and states his pain is a lot better and wants to go home.  I do think ACS is unlikely but stressed the importance of not being able to rule out cardiac pathology given the elevated troponin.  He still wants to go home.  Will have him follow-up urgently with cardiology as an outpatient and discussed that he needs to return if any symptoms recur or worsen.  He should also be following up with oncology.     Final diagnoses:  Nonspecific chest pain  Elevated troponin    ED Discharge Orders          Ordered    Ambulatory referral to Cardiology       Comments: If you have not heard from the Cardiology office within the next 72 hours please call [phone].   06/05/24 2120               Freddi Hamilton, MD 06/05/24 2322

## 2024-06-05 NOTE — Discharge Instructions (Signed)
 Call your primary care doctor and oncologist tomorrow.  We are also referring you to cardiology.  If you develop recurrent, continued, or worsening chest pain, shortness of breath, fever, vomiting, abdominal or back pain, or any other new/concerning symptoms then return to the ER for evaluation.

## 2024-06-05 NOTE — ED Triage Notes (Signed)
 Pt c/o CP that started over the weekend; central, pt says it just hurts; non radiating; endorses some dizziness; denies nausea, sob

## 2024-06-05 NOTE — ED Provider Triage Note (Signed)
 Emergency Medicine Provider Triage Evaluation Note  Joshua Compton. , a 83 y.o. male  was evaluated in triage.  Pt complains of chest pain and shortness of breath over the past week.  Is being treated for metastatic cancer.  Sounds like they are unsure the primary at this point.  Is on Eliquis , reportedly for due to history of CVA. Review of Systems  Positive:  Negative:   Physical Exam  BP (!) 131/58 (BP Location: Left Arm)   Pulse (!) 59   Temp 98.8 F (37.1 C)   Resp 18   Ht 6' (1.829 m)   Wt 88 kg   SpO2 96%   BMI 26.31 kg/m  Gen:   Awake, no distress   Resp:  Normal effort  MSK:   Moves extremities without difficulty, bilateral 1+ pitting edema Other:    Medical Decision Making  Medically screening exam initiated at 1:00 PM.  Appropriate orders placed.  Joshua Compton. was informed that the remainder of the evaluation will be completed by another provider, this initial triage assessment does not replace that evaluation, and the importance of remaining in the ED until their evaluation is complete.  Will obtain cardiac workup, given current malignancy planned to obtain CT imagine but it appears that he has a contrast allergy.  Although he has had elevated dimer's in the past we will start with this.   Joshua Compton DEL, PA-C 06/05/24 1307

## 2024-06-06 ENCOUNTER — Encounter: Payer: Self-pay | Admitting: Family Medicine

## 2024-06-06 ENCOUNTER — Other Ambulatory Visit: Payer: Self-pay | Admitting: Physician Assistant

## 2024-06-06 ENCOUNTER — Encounter: Payer: Self-pay | Admitting: Medical Oncology

## 2024-06-06 ENCOUNTER — Encounter: Payer: Self-pay | Admitting: Physician Assistant

## 2024-06-06 ENCOUNTER — Ambulatory Visit
Admission: RE | Admit: 2024-06-06 | Discharge: 2024-06-06 | Disposition: A | Discharge status: Home / Self Care | Source: Ambulatory Visit | Attending: Radiation Oncology | Admitting: Radiation Oncology

## 2024-06-06 ENCOUNTER — Other Ambulatory Visit: Payer: Self-pay

## 2024-06-06 DIAGNOSIS — Z510 Encounter for antineoplastic radiation therapy: Secondary | ICD-10-CM | POA: Diagnosis not present

## 2024-06-06 DIAGNOSIS — C61 Malignant neoplasm of prostate: Secondary | ICD-10-CM | POA: Diagnosis not present

## 2024-06-06 DIAGNOSIS — C799 Secondary malignant neoplasm of unspecified site: Secondary | ICD-10-CM

## 2024-06-06 DIAGNOSIS — C7951 Secondary malignant neoplasm of bone: Secondary | ICD-10-CM | POA: Diagnosis not present

## 2024-06-06 LAB — RAD ONC ARIA SESSION SUMMARY
Course Elapsed Days: 7
Plan Fractions Treated to Date: 6
Plan Prescribed Dose Per Fraction: 3 Gy
Plan Total Fractions Prescribed: 10
Plan Total Prescribed Dose: 30 Gy
Reference Point Dosage Given to Date: 18 Gy
Reference Point Session Dosage Given: 3 Gy
Session Number: 6

## 2024-06-06 NOTE — Progress Notes (Signed)
 [facility]  Confirmed with daughter appointment for tomorrow due to change in time, with lab at 11:30 and noon appointment with Johnston. No further questions at this time. Daughter thanked.   Colene KYM Raider, RN, BSN, [facility] Oncology Nurse Navigator, [facility] 06/06/2024 4:44 PM

## 2024-06-06 NOTE — Progress Notes (Signed)
 [facility]  Per PA-C Johnston, to schedule patient with clinic visit tomorrow, with labs. Daughter confirms available to come in at noon for labs with PA-C visit to follow. Daughter denies questions. Encouraged to call with any questions/concerns.    Colene KYM Raider, RN, BSN, [facility] Oncology Nurse Navigator, [facility] 06/06/2024 2:04 PM

## 2024-06-07 ENCOUNTER — Telehealth: Payer: Self-pay

## 2024-06-07 ENCOUNTER — Inpatient Hospital Stay

## 2024-06-07 ENCOUNTER — Ambulatory Visit
Admission: RE | Admit: 2024-06-07 | Discharge: 2024-06-07 | Discharge status: Home / Self Care | Source: Ambulatory Visit | Attending: Radiation Oncology

## 2024-06-07 ENCOUNTER — Inpatient Hospital Stay: Attending: Physician Assistant | Admitting: Physician Assistant

## 2024-06-07 ENCOUNTER — Other Ambulatory Visit: Payer: Self-pay

## 2024-06-07 VITALS — BP 134/52 | HR 63 | Temp 97.5°F | Resp 17 | Ht 72.0 in | Wt 192.4 lb

## 2024-06-07 DIAGNOSIS — I771 Stricture of artery: Secondary | ICD-10-CM | POA: Diagnosis not present

## 2024-06-07 DIAGNOSIS — Z87891 Personal history of nicotine dependence: Secondary | ICD-10-CM | POA: Insufficient documentation

## 2024-06-07 DIAGNOSIS — G893 Neoplasm related pain (acute) (chronic): Secondary | ICD-10-CM

## 2024-06-07 DIAGNOSIS — C799 Secondary malignant neoplasm of unspecified site: Secondary | ICD-10-CM | POA: Diagnosis not present

## 2024-06-07 DIAGNOSIS — Z510 Encounter for antineoplastic radiation therapy: Secondary | ICD-10-CM | POA: Diagnosis not present

## 2024-06-07 DIAGNOSIS — C801 Malignant (primary) neoplasm, unspecified: Secondary | ICD-10-CM | POA: Insufficient documentation

## 2024-06-07 DIAGNOSIS — Z5111 Encounter for antineoplastic chemotherapy: Secondary | ICD-10-CM | POA: Diagnosis not present

## 2024-06-07 DIAGNOSIS — C7951 Secondary malignant neoplasm of bone: Secondary | ICD-10-CM | POA: Diagnosis not present

## 2024-06-07 DIAGNOSIS — I700 Atherosclerosis of aorta: Secondary | ICD-10-CM | POA: Diagnosis not present

## 2024-06-07 DIAGNOSIS — I214 Non-ST elevation (NSTEMI) myocardial infarction: Secondary | ICD-10-CM | POA: Diagnosis not present

## 2024-06-07 DIAGNOSIS — R918 Other nonspecific abnormal finding of lung field: Secondary | ICD-10-CM | POA: Diagnosis not present

## 2024-06-07 DIAGNOSIS — C7989 Secondary malignant neoplasm of other specified sites: Secondary | ICD-10-CM | POA: Insufficient documentation

## 2024-06-07 DIAGNOSIS — R079 Chest pain, unspecified: Secondary | ICD-10-CM | POA: Diagnosis not present

## 2024-06-07 DIAGNOSIS — R0789 Other chest pain: Secondary | ICD-10-CM | POA: Diagnosis not present

## 2024-06-07 DIAGNOSIS — C61 Malignant neoplasm of prostate: Secondary | ICD-10-CM | POA: Diagnosis not present

## 2024-06-07 LAB — CMP (CANCER CENTER ONLY)
ALT: 26 U/L (ref 0–44)
AST: 24 U/L (ref 15–41)
Albumin: 3.7 g/dL (ref 3.5–5.0)
Alkaline Phosphatase: 206 U/L — ABNORMAL HIGH (ref 38–126)
Anion gap: 6 (ref 5–15)
BUN: 26 mg/dL — ABNORMAL HIGH (ref 8–23)
CO2: 30 mmol/L (ref 22–32)
Calcium: 9.4 mg/dL (ref 8.9–10.3)
Chloride: 103 mmol/L (ref 98–111)
Creatinine: 1.62 mg/dL — ABNORMAL HIGH (ref 0.61–1.24)
GFR, Estimated: 42 mL/min — ABNORMAL LOW (ref >60)
Glucose, Bld: 106 mg/dL — ABNORMAL HIGH (ref 70–99)
Potassium: 4.2 mmol/L (ref 3.5–5.1)
Sodium: 139 mmol/L (ref 135–145)
Total Bilirubin: 0.5 mg/dL (ref 0.0–1.2)
Total Protein: 6.9 g/dL (ref 6.5–8.1)

## 2024-06-07 LAB — RAD ONC ARIA SESSION SUMMARY
Course Elapsed Days: 8
Plan Fractions Treated to Date: 7
Plan Prescribed Dose Per Fraction: 3 Gy
Plan Total Fractions Prescribed: 10
Plan Total Prescribed Dose: 30 Gy
Reference Point Dosage Given to Date: 21 Gy
Reference Point Session Dosage Given: 3 Gy
Session Number: 7

## 2024-06-07 LAB — CBC WITH DIFFERENTIAL (CANCER CENTER ONLY)
Abs Immature Granulocytes: 0.08 K/uL — ABNORMAL HIGH (ref 0.00–0.07)
Basophils Absolute: 0.1 K/uL (ref 0.0–0.1)
Basophils Relative: 1 %
Eosinophils Absolute: 0.1 K/uL (ref 0.0–0.5)
Eosinophils Relative: 2 %
HCT: 36.4 % — ABNORMAL LOW (ref 39.0–52.0)
Hemoglobin: 11.8 g/dL — ABNORMAL LOW (ref 13.0–17.0)
Immature Granulocytes: 1 %
Lymphocytes Relative: 9 %
Lymphs Abs: 0.6 K/uL — ABNORMAL LOW (ref 0.7–4.0)
MCH: 30.6 pg (ref 26.0–34.0)
MCHC: 32.4 g/dL (ref 30.0–36.0)
MCV: 94.5 fL (ref 80.0–100.0)
Monocytes Absolute: 0.5 K/uL (ref 0.1–1.0)
Monocytes Relative: 7 %
Neutro Abs: 5.4 K/uL (ref 1.7–7.7)
Neutrophils Relative %: 80 %
Platelet Count: 207 K/uL (ref 150–400)
RBC: 3.85 MIL/uL — ABNORMAL LOW (ref 4.22–5.81)
RDW: 15.7 % — ABNORMAL HIGH (ref 11.5–15.5)
WBC Count: 6.7 K/uL (ref 4.0–10.5)
nRBC: 0 % (ref 0.0–0.2)

## 2024-06-07 NOTE — Telephone Encounter (Signed)
 Notified the pt daughter in regards to her FMLA forms have been corrected and resent on 7/22/205. No questions or concerns to be noted at this time.

## 2024-06-08 ENCOUNTER — Other Ambulatory Visit: Payer: Self-pay

## 2024-06-08 ENCOUNTER — Encounter: Payer: Self-pay | Admitting: Cardiology

## 2024-06-08 ENCOUNTER — Telehealth: Payer: Self-pay | Admitting: Hematology and Oncology

## 2024-06-08 ENCOUNTER — Ambulatory Visit
Admission: RE | Admit: 2024-06-08 | Discharge: 2024-06-08 | Disposition: A | Discharge status: Home / Self Care | Source: Ambulatory Visit | Attending: Radiation Oncology

## 2024-06-08 DIAGNOSIS — C61 Malignant neoplasm of prostate: Secondary | ICD-10-CM | POA: Diagnosis not present

## 2024-06-08 DIAGNOSIS — C7951 Secondary malignant neoplasm of bone: Secondary | ICD-10-CM | POA: Diagnosis not present

## 2024-06-08 DIAGNOSIS — Z510 Encounter for antineoplastic radiation therapy: Secondary | ICD-10-CM | POA: Diagnosis not present

## 2024-06-08 LAB — RAD ONC ARIA SESSION SUMMARY
Course Elapsed Days: 9
Plan Fractions Treated to Date: 8
Plan Prescribed Dose Per Fraction: 3 Gy
Plan Total Fractions Prescribed: 10
Plan Total Prescribed Dose: 30 Gy
Reference Point Dosage Given to Date: 24 Gy
Reference Point Session Dosage Given: 3 Gy
Session Number: 8

## 2024-06-09 ENCOUNTER — Encounter (HOSPITAL_COMMUNITY): Payer: Self-pay

## 2024-06-09 ENCOUNTER — Other Ambulatory Visit: Payer: Self-pay

## 2024-06-09 ENCOUNTER — Observation Stay (HOSPITAL_COMMUNITY)

## 2024-06-09 ENCOUNTER — Other Ambulatory Visit (HOSPITAL_COMMUNITY): Payer: Self-pay | Admitting: *Deleted

## 2024-06-09 ENCOUNTER — Ambulatory Visit

## 2024-06-09 ENCOUNTER — Emergency Department (HOSPITAL_COMMUNITY)

## 2024-06-09 ENCOUNTER — Encounter: Payer: Self-pay | Admitting: Hematology and Oncology

## 2024-06-09 ENCOUNTER — Inpatient Hospital Stay (HOSPITAL_COMMUNITY)
Admission: EM | Admit: 2024-06-09 | Discharge: 2024-06-14 | DRG: 324 | Disposition: A | Discharge status: Home / Self Care | Attending: Internal Medicine | Admitting: Internal Medicine

## 2024-06-09 DIAGNOSIS — Z96643 Presence of artificial hip joint, bilateral: Secondary | ICD-10-CM | POA: Diagnosis present

## 2024-06-09 DIAGNOSIS — I69351 Hemiplegia and hemiparesis following cerebral infarction affecting right dominant side: Secondary | ICD-10-CM | POA: Diagnosis not present

## 2024-06-09 DIAGNOSIS — E041 Nontoxic single thyroid nodule: Secondary | ICD-10-CM | POA: Diagnosis present

## 2024-06-09 DIAGNOSIS — J8410 Pulmonary fibrosis, unspecified: Secondary | ICD-10-CM | POA: Diagnosis present

## 2024-06-09 DIAGNOSIS — I130 Hypertensive heart and chronic kidney disease with heart failure and stage 1 through stage 4 chronic kidney disease, or unspecified chronic kidney disease: Secondary | ICD-10-CM | POA: Diagnosis not present

## 2024-06-09 DIAGNOSIS — I5042 Chronic combined systolic (congestive) and diastolic (congestive) heart failure: Secondary | ICD-10-CM | POA: Diagnosis present

## 2024-06-09 DIAGNOSIS — I200 Unstable angina: Secondary | ICD-10-CM | POA: Diagnosis not present

## 2024-06-09 DIAGNOSIS — Z66 Do not resuscitate: Secondary | ICD-10-CM | POA: Diagnosis present

## 2024-06-09 DIAGNOSIS — R7989 Other specified abnormal findings of blood chemistry: Secondary | ICD-10-CM | POA: Diagnosis not present

## 2024-06-09 DIAGNOSIS — C799 Secondary malignant neoplasm of unspecified site: Secondary | ICD-10-CM | POA: Diagnosis present

## 2024-06-09 DIAGNOSIS — E785 Hyperlipidemia, unspecified: Secondary | ICD-10-CM | POA: Diagnosis present

## 2024-06-09 DIAGNOSIS — C73 Malignant neoplasm of thyroid gland: Secondary | ICD-10-CM | POA: Diagnosis present

## 2024-06-09 DIAGNOSIS — I5032 Chronic diastolic (congestive) heart failure: Secondary | ICD-10-CM | POA: Diagnosis not present

## 2024-06-09 DIAGNOSIS — C801 Malignant (primary) neoplasm, unspecified: Secondary | ICD-10-CM | POA: Diagnosis present

## 2024-06-09 DIAGNOSIS — Z515 Encounter for palliative care: Secondary | ICD-10-CM | POA: Diagnosis not present

## 2024-06-09 DIAGNOSIS — E1122 Type 2 diabetes mellitus with diabetic chronic kidney disease: Secondary | ICD-10-CM | POA: Diagnosis present

## 2024-06-09 DIAGNOSIS — G893 Neoplasm related pain (acute) (chronic): Secondary | ICD-10-CM | POA: Diagnosis not present

## 2024-06-09 DIAGNOSIS — I080 Rheumatic disorders of both mitral and aortic valves: Secondary | ICD-10-CM | POA: Diagnosis present

## 2024-06-09 DIAGNOSIS — R079 Chest pain, unspecified: Secondary | ICD-10-CM | POA: Diagnosis not present

## 2024-06-09 DIAGNOSIS — I214 Non-ST elevation (NSTEMI) myocardial infarction: Secondary | ICD-10-CM | POA: Diagnosis not present

## 2024-06-09 DIAGNOSIS — Z7982 Long term (current) use of aspirin: Secondary | ICD-10-CM

## 2024-06-09 DIAGNOSIS — Z8546 Personal history of malignant neoplasm of prostate: Secondary | ICD-10-CM

## 2024-06-09 DIAGNOSIS — I509 Heart failure, unspecified: Secondary | ICD-10-CM

## 2024-06-09 DIAGNOSIS — I771 Stricture of artery: Secondary | ICD-10-CM | POA: Diagnosis not present

## 2024-06-09 DIAGNOSIS — R0789 Other chest pain: Secondary | ICD-10-CM | POA: Diagnosis not present

## 2024-06-09 DIAGNOSIS — Z7902 Long term (current) use of antithrombotics/antiplatelets: Secondary | ICD-10-CM

## 2024-06-09 DIAGNOSIS — Z91041 Radiographic dye allergy status: Secondary | ICD-10-CM

## 2024-06-09 DIAGNOSIS — I2510 Atherosclerotic heart disease of native coronary artery without angina pectoris: Secondary | ICD-10-CM | POA: Diagnosis not present

## 2024-06-09 DIAGNOSIS — Z808 Family history of malignant neoplasm of other organs or systems: Secondary | ICD-10-CM

## 2024-06-09 DIAGNOSIS — C61 Malignant neoplasm of prostate: Secondary | ICD-10-CM | POA: Diagnosis not present

## 2024-06-09 DIAGNOSIS — N1831 Chronic kidney disease, stage 3a: Secondary | ICD-10-CM | POA: Diagnosis not present

## 2024-06-09 DIAGNOSIS — Z888 Allergy status to other drugs, medicaments and biological substances status: Secondary | ICD-10-CM

## 2024-06-09 DIAGNOSIS — Z743 Need for continuous supervision: Secondary | ICD-10-CM | POA: Diagnosis not present

## 2024-06-09 DIAGNOSIS — Z79899 Other long term (current) drug therapy: Secondary | ICD-10-CM

## 2024-06-09 DIAGNOSIS — E1169 Type 2 diabetes mellitus with other specified complication: Secondary | ICD-10-CM | POA: Diagnosis present

## 2024-06-09 DIAGNOSIS — Z7901 Long term (current) use of anticoagulants: Secondary | ICD-10-CM

## 2024-06-09 DIAGNOSIS — Z823 Family history of stroke: Secondary | ICD-10-CM

## 2024-06-09 DIAGNOSIS — J432 Centrilobular emphysema: Secondary | ICD-10-CM | POA: Diagnosis not present

## 2024-06-09 DIAGNOSIS — K2100 Gastro-esophageal reflux disease with esophagitis, without bleeding: Secondary | ICD-10-CM | POA: Diagnosis present

## 2024-06-09 DIAGNOSIS — Z955 Presence of coronary angioplasty implant and graft: Principal | ICD-10-CM

## 2024-06-09 DIAGNOSIS — I480 Paroxysmal atrial fibrillation: Secondary | ICD-10-CM | POA: Diagnosis not present

## 2024-06-09 DIAGNOSIS — Z8673 Personal history of transient ischemic attack (TIA), and cerebral infarction without residual deficits: Secondary | ICD-10-CM

## 2024-06-09 DIAGNOSIS — I259 Chronic ischemic heart disease, unspecified: Secondary | ICD-10-CM | POA: Diagnosis not present

## 2024-06-09 DIAGNOSIS — I700 Atherosclerosis of aorta: Secondary | ICD-10-CM | POA: Diagnosis not present

## 2024-06-09 DIAGNOSIS — Z555 Less than a high school diploma: Secondary | ICD-10-CM

## 2024-06-09 DIAGNOSIS — I351 Nonrheumatic aortic (valve) insufficiency: Secondary | ICD-10-CM | POA: Diagnosis not present

## 2024-06-09 DIAGNOSIS — R918 Other nonspecific abnormal finding of lung field: Secondary | ICD-10-CM | POA: Diagnosis not present

## 2024-06-09 DIAGNOSIS — C7951 Secondary malignant neoplasm of bone: Secondary | ICD-10-CM | POA: Diagnosis not present

## 2024-06-09 DIAGNOSIS — J439 Emphysema, unspecified: Secondary | ICD-10-CM | POA: Diagnosis not present

## 2024-06-09 DIAGNOSIS — Z8249 Family history of ischemic heart disease and other diseases of the circulatory system: Secondary | ICD-10-CM

## 2024-06-09 DIAGNOSIS — Z87891 Personal history of nicotine dependence: Secondary | ICD-10-CM

## 2024-06-09 DIAGNOSIS — I10 Essential (primary) hypertension: Secondary | ICD-10-CM | POA: Diagnosis not present

## 2024-06-09 DIAGNOSIS — I5043 Acute on chronic combined systolic (congestive) and diastolic (congestive) heart failure: Secondary | ICD-10-CM

## 2024-06-09 DIAGNOSIS — N1832 Chronic kidney disease, stage 3b: Secondary | ICD-10-CM | POA: Diagnosis present

## 2024-06-09 DIAGNOSIS — Z87892 Personal history of anaphylaxis: Secondary | ICD-10-CM

## 2024-06-09 LAB — COMPREHENSIVE METABOLIC PANEL WITH GFR
ALT: 22 U/L (ref 0–44)
AST: 26 U/L (ref 15–41)
Albumin: 3.5 g/dL (ref 3.5–5.0)
Alkaline Phosphatase: 262 U/L — ABNORMAL HIGH (ref 38–126)
Anion gap: 9 (ref 5–15)
BUN: 20 mg/dL (ref 8–23)
CO2: 25 mmol/L (ref 22–32)
Calcium: 9.5 mg/dL (ref 8.9–10.3)
Chloride: 105 mmol/L (ref 98–111)
Creatinine, Ser: 1.31 mg/dL — ABNORMAL HIGH (ref 0.61–1.24)
GFR, Estimated: 54 mL/min — ABNORMAL LOW (ref >60)
Glucose, Bld: 96 mg/dL (ref 70–99)
Potassium: 3.9 mmol/L (ref 3.5–5.1)
Sodium: 139 mmol/L (ref 135–145)
Total Bilirubin: 1.1 mg/dL (ref 0.0–1.2)
Total Protein: 7.1 g/dL (ref 6.5–8.1)

## 2024-06-09 LAB — CBC
HCT: 38.8 % — ABNORMAL LOW (ref 39.0–52.0)
Hemoglobin: 12.2 g/dL — ABNORMAL LOW (ref 13.0–17.0)
MCH: 30.4 pg (ref 26.0–34.0)
MCHC: 31.4 g/dL (ref 30.0–36.0)
MCV: 96.8 fL (ref 80.0–100.0)
Platelets: 177 K/uL (ref 150–400)
RBC: 4.01 MIL/uL — ABNORMAL LOW (ref 4.22–5.81)
RDW: 15.8 % — ABNORMAL HIGH (ref 11.5–15.5)
WBC: 7 K/uL (ref 4.0–10.5)
nRBC: 0 % (ref 0.0–0.2)

## 2024-06-09 LAB — LIPASE, BLOOD: Lipase: 24 U/L (ref 11–51)

## 2024-06-09 LAB — HEMOGLOBIN A1C
Hgb A1c MFr Bld: 6 % — ABNORMAL HIGH (ref 4.8–5.6)
Mean Plasma Glucose: 125.5 mg/dL

## 2024-06-09 LAB — ECHOCARDIOGRAM COMPLETE
AR max vel: 2.48 cm2
AV Area VTI: 2.26 cm2
AV Area mean vel: 2.21 cm2
AV Mean grad: 9 mmHg
AV Peak grad: 16.2 mmHg
Ao pk vel: 2.01 m/s
Area-P 1/2: 1.71 cm2
Height: 72 in
S' Lateral: 4.2 cm
Weight: 3072 [oz_av]

## 2024-06-09 LAB — GLUCOSE, CAPILLARY
Glucose-Capillary: 96 mg/dL (ref 70–99)
Glucose-Capillary: 108 mg/dL — ABNORMAL HIGH (ref 70–99)
Glucose-Capillary: 120 mg/dL — ABNORMAL HIGH (ref 70–99)

## 2024-06-09 LAB — TROPONIN I (HIGH SENSITIVITY)
Troponin I (High Sensitivity): 504 ng/L (ref <18)
Troponin I (High Sensitivity): 541 ng/L (ref <18)
Troponin I (High Sensitivity): 606 ng/L (ref <18)

## 2024-06-09 LAB — D-DIMER, QUANTITATIVE: D-Dimer, Quant: >20 ug{FEU}/mL — ABNORMAL HIGH (ref 0.00–0.50)

## 2024-06-09 MED ORDER — INSULIN ASPART 100 UNIT/ML IJ SOLN: 0.0000 [IU] | Freq: Every day | INTRAMUSCULAR | Status: DC

## 2024-06-09 MED ORDER — DORZOLAMIDE HCL-TIMOLOL MAL 2-0.5 % OP SOLN
1.0000 [drp] | Freq: Every day | OPHTHALMIC | Status: DC
  Administered 2024-06-09 – 2024-06-14 (×5): 1 [drp] via OPHTHALMIC
  Filled 2024-06-09 (×2): qty 10

## 2024-06-09 MED ORDER — BISACODYL 10 MG RE SUPP: 10.0000 mg | Freq: Every day | RECTAL | Status: DC | PRN

## 2024-06-09 MED ORDER — IOHEXOL 350 MG/ML SOLN
75.0000 mL | Freq: Once | INTRAVENOUS | Status: AC | PRN
  Administered 2024-06-09: 75 mL via INTRAVENOUS

## 2024-06-09 MED ORDER — ACETAMINOPHEN 325 MG PO TABS
650.0000 mg | ORAL_TABLET | Freq: Four times a day (QID) | ORAL | Status: DC | PRN
  Administered 2024-06-11 – 2024-06-12 (×2): 650 mg via ORAL
  Filled 2024-06-09 (×3): qty 2

## 2024-06-09 MED ORDER — TRAZODONE HCL 50 MG PO TABS: 50.0000 mg | ORAL_TABLET | Freq: Every evening | ORAL | Status: DC | PRN

## 2024-06-09 MED ORDER — SACUBITRIL-VALSARTAN 24-26 MG PO TABS
1.0000 | ORAL_TABLET | Freq: Two times a day (BID) | ORAL | Status: DC
  Administered 2024-06-11 – 2024-06-14 (×7): 1 via ORAL
  Filled 2024-06-09 (×7): qty 1

## 2024-06-09 MED ORDER — ONDANSETRON HCL 4 MG/2ML IJ SOLN: 4.0000 mg | Freq: Four times a day (QID) | INTRAMUSCULAR | Status: DC | PRN

## 2024-06-09 MED ORDER — VITAMIN D 25 MCG (1000 UNIT) PO TABS
1000.0000 [IU] | ORAL_TABLET | Freq: Every day | ORAL | Status: DC
  Administered 2024-06-09 – 2024-06-14 (×6): 1000 [IU] via ORAL
  Filled 2024-06-09 (×6): qty 1

## 2024-06-09 MED ORDER — METOPROLOL SUCCINATE ER 25 MG PO TB24
12.5000 mg | ORAL_TABLET | Freq: Every day | ORAL | Status: DC
  Administered 2024-06-09 – 2024-06-14 (×6): 12.5 mg via ORAL
  Filled 2024-06-09 (×6): qty 1

## 2024-06-09 MED ORDER — ONDANSETRON HCL 4 MG PO TABS
4.0000 mg | ORAL_TABLET | Freq: Four times a day (QID) | ORAL | Status: DC | PRN
Start: 2024-06-09 — End: 2024-06-14

## 2024-06-09 MED ORDER — HYDROMORPHONE HCL 1 MG/ML IJ SOLN
0.5000 mg | Freq: Once | INTRAMUSCULAR | Status: AC
  Administered 2024-06-09: 0.5 mg via INTRAVENOUS
  Filled 2024-06-09: qty 0.5

## 2024-06-09 MED ORDER — SODIUM CHLORIDE 0.9% FLUSH
3.0000 mL | Freq: Two times a day (BID) | INTRAVENOUS | Status: DC
  Administered 2024-06-09 – 2024-06-14 (×7): 3 mL via INTRAVENOUS

## 2024-06-09 MED ORDER — METHYLPREDNISOLONE SODIUM SUCC 40 MG IJ SOLR
40.0000 mg | Freq: Once | INTRAMUSCULAR | Status: AC
  Administered 2024-06-09: 40 mg via INTRAVENOUS
  Filled 2024-06-09: qty 1

## 2024-06-09 MED ORDER — ATORVASTATIN CALCIUM 40 MG PO TABS
40.0000 mg | ORAL_TABLET | Freq: Every day | ORAL | Status: DC
  Administered 2024-06-09 – 2024-06-14 (×6): 40 mg via ORAL
  Filled 2024-06-09 (×6): qty 1

## 2024-06-09 MED ORDER — DIPHENHYDRAMINE HCL 25 MG PO CAPS: 50.0000 mg | ORAL_CAPSULE | Freq: Once | ORAL | Status: AC

## 2024-06-09 MED ORDER — ROPINIROLE HCL 0.5 MG PO TABS
1.0000 mg | ORAL_TABLET | Freq: Three times a day (TID) | ORAL | Status: DC
  Administered 2024-06-09 – 2024-06-14 (×16): 1 mg via ORAL
  Filled 2024-06-09 (×2): qty 2
  Filled 2024-06-09: qty 1
  Filled 2024-06-09 (×8): qty 2
  Filled 2024-06-09 (×3): qty 1
  Filled 2024-06-09: qty 2
  Filled 2024-06-09: qty 1

## 2024-06-09 MED ORDER — BUDESON-GLYCOPYRROL-FORMOTEROL 160-9-4.8 MCG/ACT IN AERO
2.0000 | INHALATION_SPRAY | Freq: Two times a day (BID) | RESPIRATORY_TRACT | Status: DC
  Administered 2024-06-11 – 2024-06-14 (×6): 2 via RESPIRATORY_TRACT
  Filled 2024-06-09 (×2): qty 5.9

## 2024-06-09 MED ORDER — INSULIN ASPART 100 UNIT/ML IJ SOLN
0.0000 [IU] | Freq: Three times a day (TID) | INTRAMUSCULAR | Status: DC
  Administered 2024-06-14: 1 [IU] via SUBCUTANEOUS

## 2024-06-09 MED ORDER — MORPHINE SULFATE (PF) 4 MG/ML IV SOLN
4.0000 mg | Freq: Once | INTRAVENOUS | Status: AC
  Administered 2024-06-09: 4 mg via INTRAVENOUS
  Filled 2024-06-09: qty 1

## 2024-06-09 MED ORDER — DIPHENHYDRAMINE HCL 50 MG/ML IJ SOLN
50.0000 mg | Freq: Once | INTRAMUSCULAR | Status: AC
  Administered 2024-06-09: 50 mg via INTRAVENOUS
  Filled 2024-06-09: qty 1

## 2024-06-09 MED ORDER — ORAL CARE MOUTH RINSE: 15.0000 mL | OROMUCOSAL | Status: DC | PRN

## 2024-06-09 MED ORDER — SODIUM CHLORIDE 0.9 % IV SOLN: INTRAVENOUS | Status: AC | PRN

## 2024-06-09 MED ORDER — OXYCODONE HCL 5 MG PO TABS
10.0000 mg | ORAL_TABLET | Freq: Four times a day (QID) | ORAL | Status: DC | PRN
  Administered 2024-06-09 – 2024-06-12 (×6): 10 mg via ORAL
  Filled 2024-06-09 (×6): qty 2

## 2024-06-09 MED ORDER — PREDNISONE 20 MG PO TABS
50.0000 mg | ORAL_TABLET | Freq: Every day | ORAL | Status: DC
  Administered 2024-06-10 – 2024-06-12 (×3): 50 mg via ORAL
  Filled 2024-06-09 (×4): qty 1

## 2024-06-09 MED ORDER — PANTOPRAZOLE SODIUM 40 MG PO TBEC
40.0000 mg | DELAYED_RELEASE_TABLET | Freq: Every day | ORAL | Status: DC
  Administered 2024-06-09 – 2024-06-14 (×6): 40 mg via ORAL
  Filled 2024-06-09 (×6): qty 1

## 2024-06-09 MED ORDER — ACETAMINOPHEN 650 MG RE SUPP: 650.0000 mg | Freq: Four times a day (QID) | RECTAL | Status: DC | PRN

## 2024-06-09 MED ORDER — APIXABAN 2.5 MG PO TABS
2.5000 mg | ORAL_TABLET | Freq: Two times a day (BID) | ORAL | Status: DC
  Administered 2024-06-09 – 2024-06-10 (×3): 2.5 mg via ORAL
  Filled 2024-06-09 (×3): qty 1

## 2024-06-09 MED ORDER — POLYETHYLENE GLYCOL 3350 17 G PO PACK: 17.0000 g | PACK | Freq: Every day | ORAL | Status: DC | PRN

## 2024-06-09 MED ORDER — SODIUM CHLORIDE 0.9 % IV SOLN: INTRAVENOUS | Status: AC

## 2024-06-09 MED ORDER — AMIODARONE HCL 200 MG PO TABS
200.0000 mg | ORAL_TABLET | Freq: Every day | ORAL | Status: DC
  Administered 2024-06-09 – 2024-06-14 (×6): 200 mg via ORAL
  Filled 2024-06-09 (×6): qty 1

## 2024-06-09 MED ORDER — SODIUM CHLORIDE 0.9% FLUSH
3.0000 mL | Freq: Two times a day (BID) | INTRAVENOUS | Status: DC
  Administered 2024-06-09 – 2024-06-14 (×10): 3 mL via INTRAVENOUS

## 2024-06-09 MED ORDER — OXYCODONE HCL ER 10 MG PO T12A
10.0000 mg | EXTENDED_RELEASE_TABLET | Freq: Two times a day (BID) | ORAL | Status: DC
  Administered 2024-06-09 – 2024-06-14 (×11): 10 mg via ORAL
  Filled 2024-06-09 (×11): qty 1

## 2024-06-09 MED ORDER — SODIUM CHLORIDE 0.9% FLUSH: 3.0000 mL | INTRAVENOUS | Status: DC | PRN

## 2024-06-09 NOTE — H&P (Addendum)
 History and Physical   Patient: Joshua Compton. FMW:[id number] DOB: October 20, 1941 DOA: 06/09/2024 DOS: the patient was seen and examined on 06/09/2024 PCP: [name], MD  Patient coming from: Home  Chief Complaint:  Chief Complaint  Patient presents with   Chest Pain   HPI: Joshua Compton. is a 83 y.o. 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 Eletha - Additional history obtained from patient's 3 daughters at bedside No fever  Or chills  -- Additional history obtained from patient's 3 daughters [name] Query and Arland at bedside No Nausea, Vomiting or Diarrhea -- Cardiology consulted in the ED appreciated Troponin 504 >>541>>606 -Serial EKGs shows normal sinus rhythm without acute concerns - D-dimer greater than 20 similar to prior - CTA chest today without acute PE similar to CTA chest from 06/05/2024, Multiple pulmonary nodules, subcutaneous soft tissue nodules and left posterior fourth rib lytic expansile lesion, stable to prior.--Pulmonary fibrosis and emphysema noted -Left lower lobe subpleural consolidation/mass versus round atelectasis stable to prior, however gradually enlarging to September 05, 2023. Malignancy cannot be excluded. - Creatinine is down to 1.31 from 1.61 on 06/07/2024 -  LFTs are not elevated except for alk phos of 252 - WBC 7.0 hemoglobin stable at 12.2 platelets 177 - A1c 6.0  Review of Systems: As mentioned in the history of present illness. All other systems reviewed and are negative. Past Medical History:  Diagnosis Date   Acute heart failure with preserved ejection fraction (HFpEF) (HCC) 09/05/2023   Acute HFrEF (heart failure with reduced ejection fraction) (HCC) 09/08/2023   Acute respiratory failure with hypoxia (HCC) 09/05/2023   Allergy    Arthritis    Cancer (HCC)    prostate ca   seed implants   CHF (congestive heart failure) (HCC)    GERD (gastroesophageal reflux disease)    Glaucoma    Hyperlipidemia    Hypertension    dr cyrena     [place]   fm   Stroke [facility]) 07/2023   Past Surgical History:  Procedure Laterality Date   BACK SURGERY     x2   COLONOSCOPY     EYE SURGERY     cataract   HERNIA REPAIR  11/06/2009   left inguinal repair    NECK SURGERY     ROTATOR CUFF REPAIR Left 11/2014   TOTAL HIP ARTHROPLASTY Right 04/20/2020   Procedure: RIGHT TOTAL HIP ARTHROPLASTY ANTERIOR APPROACH;  Surgeon: Jerri Kay HERO, MD;  Location: [facility] OR;  Service: Orthopedics;  Laterality: Right;   TOTAL HIP ARTHROPLASTY Left 01/07/2021   Procedure: LEFT TOTAL HIP ARTHROPLASTY ANTERIOR APPROACH;  Surgeon: Jerri Kay HERO, MD;  Location: [facility] OR;  Service: Orthopedics;  Laterality: Left;   TRANSURETHRAL RESECTION OF PROSTATE     Social History:  reports that he quit smoking about 19 years ago. His smoking use  included cigarettes. He started smoking about 49 years ago. He has a 15 pack-year smoking history. He has never used smokeless tobacco. He reports that he does not drink alcohol and does not use drugs.  Allergies  Allergen Reactions   Iodinated Contrast Media Anaphylaxis    Tongue swelled up when he had MRI in [facility]  Intubated for airway protection after oral and throat swelling   Pravastatin  Other (See Comments)    Muscle aches    Family History  Problem Relation Age of Onset   Cancer Mother        breast   Heart disease Father    Stroke Sister    Hypertension Sister    Healthy Sister    Heart attack Sister    Other Sister        unknown cause   Cancer Sister    Other Brother        multiple back surgeries   Hypertension Brother    Throat cancer Brother    Cancer Brother        groin related cancer   Heart attack Brother    Atrial fibrillation Daughter    Insurance account manager Parkinson White syndrome Daughter    Cancer Son        luekimia   Heart disease Paternal Uncle    Other Brother        suicide-   Colon cancer Neg Hx    Rectal cancer Neg Hx    Stomach cancer Neg Hx     Prior to Admission medications   Medication Sig Start Date End Date Taking? Authorizing Provider  amiodarone  (PACERONE ) 200 MG tablet Take 1 tablet (200 mg total) by mouth daily. 02/25/24  Yes BranchDorn FALCON, MD  apixaban  (ELIQUIS ) 2.5 MG TABS tablet Take 1 tablet (2.5 mg total) by mouth 2 (two) times daily. 02/25/24  Yes Branch, Dorn FALCON, MD  atorvastatin  (LIPITOR) 40 MG tablet Take 1 tablet (40 mg total) by mouth daily. 02/18/24  Yes Stacks, Butler, MD  cholecalciferol  (VITAMIN D3) 25 MCG (1000 UNIT) tablet TAKE 1 TABLET BY MOUTH EVERY DAY 02/16/24  Yes Stacks, Butler, MD  dorzolamide -timolol  (COSOPT ) 22.3-6.8 MG/ML ophthalmic solution Place 1 drop into both eyes daily.  08/04/16  Yes [provider]  metoprolol  succinate (TOPROL -XL) 25 MG 24 hr tablet TAKE 0.5 TABLETS (12.5 MG TOTAL) BY MOUTH DAILY. FOR BLOOD PRESSURE CONTROL 01/07/24  Yes Branch, Dorn FALCON, MD  oxyCODONE  ER (XTAMPZA  ER) 9 MG C12A Take 9 mg by mouth every 12 (twelve) hours as needed. 05/26/24  Yes Thayil, Irene T, PA-C  Oxycodone  HCl 10 MG TABS Take 1 tablet (10 mg total) by mouth every 6 (six) hours as needed for up to 120 doses. 05/02/24  Yes Thayil, Irene T, PA-C  pantoprazole  (PROTONIX ) 40 MG tablet Take 1 tablet (40 mg total) by mouth daily. For heartburn  07/28/23  Yes Stacks, Butler, MD  rOPINIRole  (REQUIP ) 1 MG tablet Take 1 tablet (1 mg total) by mouth 3 (three) times daily. Plus an extra tablet at bedtime to total four a day 11/30/23  Yes Stacks, Butler, MD  sacubitril -valsartan  (ENTRESTO ) 24-26 MG Take 1 tablet by mouth 2 (two) times daily. 04/05/24  Yes Zollie Butler, MD  Fluticasone -Umeclidin-Vilant (TRELEGY ELLIPTA ) 100-62.5-25 MCG/ACT AEPB Inhale 1 puff into the lungs daily. Patient not taking: Reported on 06/09/2024 01/22/24   Jude Harden GAILS, MD   Physical Exam: Vitals:   06/09/24 0550 06/09/24 0730 06/09/24 1104 06/09/24 1438  BP:  ROLLEN)  166/59 (!) 149/62 (!) 140/53  Pulse:  64 68 65  Resp:    18  Temp:   98 F (36.7 C) 98.9 F (37.2 C)  TempSrc:   Axillary Axillary  SpO2:  97% 92% 95%  Weight: 87.1 kg     Height: 6' (1.829 m)      Physical Exam Gen:- Awake Alert, in no acute distress  HEENT:- [place].AT, No sclera icterus Neck-Supple Neck,No JVD,.  Lungs-  CTAB , fair air movement bilaterally  CV- S1, S2 normal, RRR Abd-  +ve B.Sounds, Abd Soft, No tenderness,    Extremity/Skin:- No  edema,   good pedal pulses  Psych-affect is appropriate, oriented x3 Neuro-generalized weakness, residual right-sided hemiparesis, No additional/New focal deficits, no tremors MSK--reproducible left-sided chest wall and left-sided upper back area tenderness on palpation  Data Reviewed: Troponin 504 >>541>>606 -Serial EKGs shows normal sinus rhythm without acute concerns - D-dimer greater than 20 similar to prior - CTA chest today without acute PE similar to CTA chest from 06/05/2024, Multiple pulmonary nodules, subcutaneous soft tissue nodules and left posterior fourth rib lytic expansile lesion, stable to prior.--Pulmonary fibrosis and emphysema noted -Left lower lobe subpleural consolidation/mass versus round atelectasis stable to prior, however gradually enlarging to September 05, 2023. Malignancy cannot be excluded. - Creatinine is down to 1.31 from  1.61 on 06/07/2024 - LFTs are not elevated except for alk phos of 252 - WBC 7.0 hemoglobin stable at 12.2 platelets 177 - A1c 6.0  Assessment and Plan: 1)Atypical chest pains with troponin elevation Troponin 504 >>541>>606 -Serial EKGs shows normal sinus rhythm without acute concerns - D-dimer greater than 20 similar to prior - CTA chest today without acute PE similar to CTA chest from 06/05/2024, - Repeat echo on 06/09/2024 with EF of 50 to 55%, global left ventricular hypokinesis similar to prior, grade 1 diastolic dysfunction, mild LVH, no AS, mild to moderate AR(AI), mild MR, No MS  -- Per Dr. Barnetta from cardiology service--observe Overnight and trend troponins in a.m. - Continue atorvastatin , Toprol -XL and apixaban   2)Metastatic adenocarcinoma of the left thyroid  (unclear if primary Thyroid  or metastatic to the thyroid )--with bony mets to the mesentery left posterior fourth rib as demonstrated by PET scan from 04/29/2024 and CTA chest from 06/05/2024 and 06/09/2024 -Patient was recently started on radiation therapy few days ago -He is scheduled for thyroid  surgery on 06/27/2024 with Dr. Eletha -- Awaiting placement of Port-A-Cath to initiate palliative chemotherapy -- Continue opiates - Get palliative care consult to help with pain management - Radiation therapy and chemotherapy will also help with pain management  3)Social/Ethics--- plan of care, advanced directives and CODE STATUS discussed with patient himself as well as patient's 3 daughters Natalie (HCPOA), Arland armin Query at bedside Discussion: We discussed goals of care for [employer]. .   -- Plan of care, goals of care and advance directive discussed -Patient and family request DNR DNI status without further limitations to treatment --Patient wants to continue to treat the treatable -He wants to continue with radiation therapy -He is hoping to get a Port-A-Cath inserted so he can get chemotherapy -He is hoping to get  thyroid  surgery on 06/27/2024 after cardiology clearance -Again No limitations to treatment , but he request DNR/DNI status  4)DM2-A1c 6.0 reflecting excellent diabetic control PTA -Use Novolog /Humalog Sliding scale insulin  with Accu-Cheks/Fingersticks as ordered   5)GERD--stable, continue Protonix   6)HFimpEF/chronic diastolic dysfunction CHF--Repeat echo on 06/09/2024 with EF of 50 to 55% (EF 4045 to 50% on  12/16/2023), global left ventricular hypokinesis similar to prior, grade 1 diastolic dysfunction- -no acute CHF exacerbation at this time - Hold Entresto  due to contrast exposure - Continue Toprol -XL  7)COPD/emphysema--no acute exacerbation at this time - Continue bronchodilators -CTA chest  on 06/09/2024 showed Multiple pulmonary nodules, ,Pulmonary fibrosis and emphysema noted, -Left lower lobe subpleural consolidation/mass versus round atelectasis stable to prior, however gradually enlarging to September 05, 2023. Malignancy cannot be excluded.  8)PAFib--continue amiodarone  and Toprol -XL for rhythm and rate control - Continue Eliquis  for stroke secondary prophylaxis  9)H/o Pior CVA--patient has residual right-sided hemiparesis - Continue Eliquis  as above in #8 for secondary stroke prophylaxis - Continue atorvastatin  for stroke reduction  10)CKD stage - 3A  Creatinine is down to 1.31 from 1.61 on 06/07/2024 --Monitor renal function closely with contrast exposure on 06/09/2024 - renally adjust medications, avoid nephrotoxic agents / dehydration  / hypotension   Advance Care Planning:   Code Status: Limited: Do not attempt resuscitation (DNR) -DNR-LIMITED -Do Not Intubate/DNI    Consults: Cardiology  Family Communication: patient's 3 daughters [name] (HCPOA), Arland armin Query at bedside  Severity of Illness: The appropriate patient status for this patient is OBSERVATION. Observation status is judged to be reasonable and necessary in order to provide the required intensity of service  to ensure the patient's safety. The patient's presenting symptoms, physical exam findings, and initial radiographic and laboratory data in the context of their medical condition is felt to place them at decreased risk for further clinical deterioration. Furthermore, it is anticipated that the patient will be medically stable for discharge from the hospital within 2 midnights of admission.   Author: Rendall Carwin, MD 06/09/2024 2:47 PM  For on call review [email or web address].

## 2024-06-09 NOTE — Progress Notes (Signed)
  Interdisciplinary Goals of Care Family Meeting   Date carried out: 06/09/2024  Location of the meeting: Bedside  Member's involved: Physician and Family Member or next of kin -- Patient's 3 daughters [name] (HCPOA), Arland and Joen at [employer] or Environmental health practitioner: Patient and [name]  Discussion: We discussed goals of care for [employer]. .   -- Plan of care, goals of care and advance directive discussed -Patient and family request DNR DNI status without further limitations to treatment --Patient wants to continue to treat the treatable -He wants to continue with radiation therapy -He is hoping to get a Port-A-Cath inserted so he can get chemotherapy -He is hoping to get thyroid  surgery on 06/27/2024 after cardiology clearance -Again No limitations to treatment , but he request DNR/DNI status  Code status:   Code Status: Limited: Do not attempt resuscitation (DNR) -DNR-LIMITED -Do Not Intubate/DNI    Disposition: Home  Time spent for the meeting: 36 mins  Rendall Carwin, MD  06/09/2024, 6:13 PM

## 2024-06-09 NOTE — ED Triage Notes (Signed)
 Pt complains of chest pain that radiates to back. Pt states pain is chronic and he usually takes oxycodone  but waited too late to take it this time. Pt states he has cancer but unable to tell what kind of cancer.

## 2024-06-09 NOTE — Progress Notes (Signed)
 [facility] [phone]   [phone]  PROGRESS NOTE:  Patient Care Team: [name], MD as PCP - General (Family Medicine) Alvan, Dorn FALCON, MD as PCP - Cardiology (Cardiology) Kristie Lamprey, MD as Consulting Physician (Gastroenterology) Alix Charleston, MD as Consulting Physician (Neurosurgery) Watt Rush, MD as Attending Physician (Urology) Cleatus Collar, MD as Consulting Physician (Ophthalmology) Watt Rush, MD as Attending Physician (Urology) Golden Forestine BROCKS, RN as Oncology Nurse Navigator (Medical Oncology) Neomi Johnston ONEIDA DEVONNA as Physician Assistant (Hematology and Oncology)  CHIEF COMPLAINTS/PURPOSE OF CONSULTATION:  -Metastatic adenocarcinoma involving the bones and muscle, primary unknown  ONCOLOGIC HISTORY: 04/16/2024: Presented to ER due to chest pain and back pain x 1 month CT chest: There is increased masslike soft tissue density within an area of paraseptal emphysema in the LEFT lower lobe. This is concerning for primary lung neoplasm although superimposed infection is in the differential.  There is an expansile area of erosive appearing lucency in the LEFT posterior fourth rib with possible associated nondisplaced rib fracture. This is concerning for a pathologic fracture. Mildly increased conspicuity of additional LEFT lower lobe pulmonary nodules. CT T Spine-no concerning bone lesions 04/20/2024: Establish care with [facility] 04/29/2024: PET scan showed intensely hypermetabolic right thyroid  mass, peripheral nodular thickening in the left lower lobe with mild metabolic activity for size that is indeterminate, single hypermetabolic left hilar lymph node.  Multiple hypermetabolic skeletal metastasis, multiple hypermetabolic muscular metastasis including left gluteal muscle lesion. 05/05/2024: Underwent IR biopsy of soft tissue mass involving the left hip and FNA of right thyroid  mass.  Pathology of the soft  tissue mass involving the left hip was consistent with metastatic adenocarcinoma, moderately differentiated with possible primaries including pancreatobiliary tract and GI tract.  Pathology of thyroid  mass was positive for malignancy but there was not enough tissue to differentiate metastatic carcinoma versus primary thyroid  malignancy. 05/30/2024: Started palliative radiation to T4 spine and left chest.  Target goals 30 Gray delivered in 10 fractions.  HISTORY OF PRESENTING ILLNESS:  Joshua Compton. 83 y.o. male returns for follow-up visit after recent diagnosis for metastatic adenocarcinoma of unknown primary. He is accompanied by his daughters for this visit.   On exam today, Joshua Compton reports he continues to have pain across his lower chest that has not improved with radiation.  He takes Xtampza  18 mg q 12 hours and oxycodone  10 mg q 4-6 hours for breakthrough pain.  He reports his energy and appetite are fairly stable.  He does use a wheelchair for longer distances.He denies easy bruising or signs of bleeding. He denies fevers, chills,sweats, shortness of breath, headaches or dizziness. He has no other complaints. Rest of the 10 point ROS is below.   MEDICAL HISTORY:  Past Medical History:  Diagnosis Date   Acute heart failure with preserved ejection fraction (HFpEF) (HCC) 09/05/2023   Acute HFrEF (heart failure with reduced ejection fraction) (HCC) 09/08/2023   Acute respiratory failure with hypoxia (HCC) 09/05/2023   Allergy    Arthritis    Cancer (HCC)    prostate ca   seed implants   CHF (congestive heart failure) (HCC)    GERD (gastroesophageal reflux disease)    Glaucoma    Hyperlipidemia    Hypertension    dr cyrena     [place]   fm   Stroke [facility]) 07/2023    SURGICAL HISTORY: Past Surgical History:  Procedure Laterality Date   BACK SURGERY     x2  COLONOSCOPY     EYE SURGERY     cataract   HERNIA REPAIR  11/06/2009   left inguinal repair    NECK SURGERY      ROTATOR CUFF REPAIR Left 11/2014   TOTAL HIP ARTHROPLASTY Right 04/20/2020   Procedure: RIGHT TOTAL HIP ARTHROPLASTY ANTERIOR APPROACH;  Surgeon: Jerri Kay HERO, MD;  Location: [facility] OR;  Service: Orthopedics;  Laterality: Right;   TOTAL HIP ARTHROPLASTY Left 01/07/2021   Procedure: LEFT TOTAL HIP ARTHROPLASTY ANTERIOR APPROACH;  Surgeon: Jerri Kay HERO, MD;  Location: [facility] OR;  Service: Orthopedics;  Laterality: Left;   TRANSURETHRAL RESECTION OF PROSTATE      SOCIAL HISTORY: Social History   Socioeconomic History   Marital status: Married    Spouse name: Not on file   Number of children: 5   Years of education: 11   Highest education level: 11th grade  Occupational History   Occupation: Nutritional therapist    Comment: retired  Tobacco Use   Smoking status: Former    Current packs/day: 0.00    Average packs/day: 0.5 packs/day for 30.0 years (15.0 ttl pk-yrs)    Types: Cigarettes    Start date: 03/29/1975    Quit date: 03/28/2005    Years since quitting: 19.2   Smokeless tobacco: Never  Vaping Use   Vaping status: Never Used  Substance and Sexual Activity   Alcohol use: No   Drug use: Never   Sexual activity: Not Currently    Birth control/protection: None  Other Topics Concern   Not on file  Social History Narrative   Married. 1 daughter and 1 son from current marriage. 3 daughters from prior marriage. 10 grandkids      Retired Proofreader: fishing, golf, enjoys being outdoors   Social Drivers of Corporate investment banker Strain: Low Risk  (05/26/2024)   Overall Financial Resource Strain (CARDIA)    Difficulty of Paying Living Expenses: Not hard at all  Food Insecurity: No Food Insecurity (05/26/2024)   Hunger Vital Sign    Worried About Running Out of Food in the Last Year: Never true    Ran Out of Food in the Last Year: Never true  Transportation Needs: No Transportation Needs (05/26/2024)   PRAPARE - Scientist, research (physical sciences) (Medical): No    Lack of Transportation (Non-Medical): No  Physical Activity: Inactive (05/26/2024)   Exercise Vital Sign    Days of Exercise per Week: 0 days    Minutes of Exercise per Session: Not on file  Stress: No Stress Concern Present (05/26/2024)   [employer] of Occupational Health - Occupational Stress Questionnaire    Feeling of Stress: Not at all  Social Connections: Moderately Isolated (05/26/2024)   Social Connection and Isolation Panel    Frequency of Communication with Friends and Family: Once a week    Frequency of Social Gatherings with Friends and Family: Once a week    Attends Religious Services: Patient declined    Database administrator or Organizations: Yes    Attends Banker Meetings: 1 to 4 times per year    Marital Status: Married  Catering manager Violence: Not At Risk (09/05/2023)   Humiliation, Afraid, Rape, and Kick questionnaire    Fear of Current or Ex-Partner: No    Emotionally Abused: No    Physically Abused: No    Sexually Abused: No    FAMILY HISTORY: Family History  Problem Relation Age of Onset   Cancer Mother        breast   Heart disease Father    Stroke Sister    Hypertension Sister    Healthy Sister    Heart attack Sister    Other Sister        unknown cause   Cancer Sister    Other Brother        multiple back surgeries   Hypertension Brother    Throat cancer Brother    Cancer Brother        groin related cancer   Heart attack Brother    Atrial fibrillation Daughter    Insurance account manager Parkinson White syndrome Daughter    Cancer Son        luekimia   Heart disease Paternal Uncle    Other Brother        suicide-   Colon cancer Neg Hx    Rectal cancer Neg Hx    Stomach cancer Neg Hx     ALLERGIES:  is allergic to iodinated contrast media and pravastatin .  MEDICATIONS:  No current facility-administered medications for this visit.   Current Outpatient Medications  Medication Sig Dispense Refill    acetaminophen  (TYLENOL ) 500 MG tablet Take 1,000 mg by mouth every 6 (six) hours as needed for mild pain (pain score 1-3).     amiodarone  (PACERONE ) 200 MG tablet Take 1 tablet (200 mg total) by mouth daily. 90 tablet 3   apixaban  (ELIQUIS ) 2.5 MG TABS tablet Take 1 tablet (2.5 mg total) by mouth 2 (two) times daily. 60 tablet 11   atorvastatin  (LIPITOR) 40 MG tablet Take 1 tablet (40 mg total) by mouth daily. 90 tablet 1   cholecalciferol  (VITAMIN D3) 25 MCG (1000 UNIT) tablet TAKE 1 TABLET BY MOUTH EVERY DAY 90 tablet 1   dapagliflozin  propanediol (FARXIGA ) 10 MG TABS tablet Take 1 tablet (10 mg total) by mouth daily. 90 tablet 3   dorzolamide -timolol  (COSOPT ) 22.3-6.8 MG/ML ophthalmic solution Place 1 drop into both eyes daily.      fluticasone  (FLONASE ) 50 MCG/ACT nasal spray SPRAY 2 SPRAYS INTO EACH NOSTRIL EVERY DAY 48 mL 2   Fluticasone -Umeclidin-Vilant (TRELEGY ELLIPTA ) 100-62.5-25 MCG/ACT AEPB Inhale 1 puff into the lungs daily. (Patient not taking: Reported on 06/09/2024) 28 each 5   metoprolol  succinate (TOPROL -XL) 25 MG 24 hr tablet TAKE 0.5 TABLETS (12.5 MG TOTAL) BY MOUTH DAILY. FOR BLOOD PRESSURE CONTROL 45 tablet 1   oxyCODONE  ER (XTAMPZA  ER) 9 MG C12A Take 9 mg by mouth every 12 (twelve) hours as needed. 60 capsule 0   Oxycodone  HCl 10 MG TABS Take 1 tablet (10 mg total) by mouth every 6 (six) hours as needed for up to 120 doses. 120 tablet 0   pantoprazole  (PROTONIX ) 40 MG tablet Take 1 tablet (40 mg total) by mouth daily. For heartburn 90 tablet 3   potassium chloride  SA (KLOR-CON  M) 20 MEQ tablet TAKE 1 TABLET (20 MEQ TOTAL) BY MOUTH DAILY. AS A POTASSIUM SUPPLEMENT 90 tablet 1   rOPINIRole  (REQUIP ) 1 MG tablet Take 1 tablet (1 mg total) by mouth 3 (three) times daily. Plus an extra tablet at bedtime to total four a day 360 tablet 3   sacubitril -valsartan  (ENTRESTO ) 24-26 MG Take 1 tablet by mouth 2 (two) times daily. 60 tablet 3   torsemide  (DEMADEX ) 20 MG tablet TAKE 2 TABLETS  (40 MG TOTAL) BY MOUTH DAILY. (Patient taking differently: Take 20 mg by mouth daily.) 180 tablet  0   oxyCODONE  (OXYCONTIN ) 10 mg 12 hr tablet Take 1 tablet (10 mg total) by mouth every 12 (twelve) hours. 60 tablet 0   Facility-Administered Medications Ordered in Other Visits  Medication Dose Route Frequency Provider Last Rate Last Admin   diphenhydrAMINE  (BENADRYL ) capsule 50 mg  50 mg Oral Once Bero, Michael M, MD       Or   diphenhydrAMINE  (BENADRYL ) injection 50 mg  50 mg Intravenous Once Bero, Michael M, MD        REVIEW OF SYSTEMS:   Constitutional: ( - ) fevers, ( - )  chills , ( - ) night sweats Eyes: ( - ) blurriness of vision, ( - ) double vision, ( - ) watery eyes Ears, nose, mouth, throat, and face: ( - ) mucositis, ( - ) sore throat Respiratory: ( - ) cough, ( - ) dyspnea, ( - ) wheezes Cardiovascular: ( - ) palpitation, ( +) chest discomfort, ( - ) lower extremity swelling Gastrointestinal:  ( - ) nausea, ( - ) heartburn, ( - ) change in bowel habits Skin: ( - ) abnormal skin rashes Lymphatics: ( - ) new lymphadenopathy, ( - ) easy bruising Neurological: ( - ) numbness, ( - ) tingling, ( - ) new weaknesses Behavioral/Psych: ( - ) mood change, ( - ) new changes  All other systems were reviewed with the patient and are negative.  PHYSICAL EXAMINATION: ECOG PERFORMANCE STATUS: 1 - Symptomatic but completely ambulatory  Vitals:   06/07/24 1222  BP: (!) 134/52  Pulse: 63  Resp: 17  Temp: (!) 97.5 F (36.4 C)  SpO2: 93%   Filed Weights   06/07/24 1222  Weight: 192 lb 7 oz (87.3 kg)    GENERAL: well appearing male in NAD  SKIN: skin color, texture, turgor are normal, no rashes or significant lesions EYES: conjunctiva are pink and non-injected, sclera clear LUNGS: clear to auscultation and percussion with normal breathing effort HEART: regular rate & rhythm and no murmurs and no lower extremity edemas Musculoskeletal: no cyanosis of digits and no clubbing.SABRA  PSYCH:  alert & oriented x 3, fluent speech NEURO: no focal motor/sensory deficits  LABORATORY DATA:  I have reviewed the data as listed    Latest Ref Rng & Units 06/09/2024    6:41 AM 06/07/2024   11:35 AM 06/05/2024    1:05 PM  CBC  WBC 4.0 - 10.5 K/uL 7.0  6.7  4.8   Hemoglobin 13.0 - 17.0 g/dL 87.7  88.1  87.6   Hematocrit 39.0 - 52.0 % 38.8  36.4  38.8   Platelets 150 - 400 K/uL 177  207  236        Latest Ref Rng & Units 06/09/2024    6:41 AM 06/07/2024   11:35 AM 06/05/2024    3:25 PM  CMP  Glucose 70 - 99 mg/dL 96  893  899   BUN 8 - 23 mg/dL 20  26  24    Creatinine 0.61 - 1.24 mg/dL 8.68  8.37  8.17   Sodium 135 - 145 mmol/L 139  139  139   Potassium 3.5 - 5.1 mmol/L 3.9  4.2  4.9   Chloride 98 - 111 mmol/L 105  103  100   CO2 22 - 32 mmol/L 25  30  28    Calcium  8.9 - 10.3 mg/dL 9.5  9.4  9.7   Total Protein 6.5 - 8.1 g/dL 7.1  6.9  7.4   Total Bilirubin  0.0 - 1.2 mg/dL 1.1  0.5  0.9   Alkaline Phos 38 - 126 U/L 262  206  127   AST 15 - 41 U/L 26  24  29    ALT 0 - 44 U/L 22  26  38      RADIOGRAPHIC STUDIES: I have personally reviewed the radiological images as listed and agreed with the findings in the report. DG Chest Port 1 View Result Date: 06/09/2024 CLINICAL DATA:  Chest pains radiating posteriorly. EXAM: PORTABLE CHEST 1 VIEW COMPARISON:  CTA chest 06/05/2024 FINDINGS: Emphysematous and chronic changes of the lungs. Focal left lower lobe opacity on the prior study is not seen today. Heart size and vascular pattern are normal. Mediastinum is stable with aortic tortuosity and atherosclerosis. The bones are demineralized. IMPRESSION: 1. Emphysematous and chronic changes of the lungs. No acute chest findings. 2. Aortic atherosclerosis. Electronically Signed   By: Francis Quam M.D.   On: 06/09/2024 07:08   CT Angio Chest PE W and/or Wo Contrast Result Date: 06/05/2024 EXAM: CTA of the Chest with contrast for PE 06/05/2024 07:47:26 PM TECHNIQUE: CTA of the chest was  performed after the administration of intravenous contrast. Multiplanar reformatted images are provided for review. MIP images are provided for review. Automated exposure control, iterative reconstruction, and/or weight based adjustment of the mA/kV was utilized to reduce the radiation dose to as low as reasonably achievable. COMPARISON: PET/CT dated 04/29/2024. CLINICAL HISTORY: Pulmonary embolism (PE) suspected, low to intermediate prob, positive D-dimer. FINDINGS: PULMONARY ARTERIES: Pulmonary arteries are adequately opacified for evaluation. No evidence of pulmonary embolism. MEDIASTINUM: 3.6 cm right thyroid  mass, corresponding to the patient's biopsy proven thyroid  versus metastatic malignancy. Moderate 3-vessel coronary atherosclerosis. LYMPH NODES: No suspicious mediastinal lymphadenopathy on CT. LUNGS AND PLEURA: Moderate centrilobular and paraseptal emphysematous changes, upper lung predominant. Mild patchy posterior lower lobe opacities, likely atelectasis. No pneumothorax. UPPER ABDOMEN: Simple left renal cysts measuring up to 4.9 cm, benign (Bosniak 1). No follow up is recommended. SOFT TISSUES AND BONES: Lytic metastasis involving the left posterior fourth rib, without pathologic fracture. Mild degenerative changes of the visualized thoracolumbar spine. AORTA: Although not tailored for evaluation of the thoracic aorta, there is no evidence of thoracic aortic aneurysm or dissection. Thoracic aortic atherosclerosis. IMPRESSION: 1. No evidence of pulmonary embolism. 2. 3.6 cm right thyroid  mass, corresponding to the patient's biopsy proven primary vs metastatic malignancy. 3. Lytic metastasis involving the left posterior fourth rib, without pathologic fracture. Electronically signed by: Pinkie Pebbles MD 06/05/2024 08:02 PM EDT RP Workstation: [id number]   DG Chest 2 View Result Date: 06/05/2024 CLINICAL DATA:  Chest pain EXAM: CHEST - 2 VIEW COMPARISON:  X-ray 04/16/2024. PET-CT 04/29/2024 FINDINGS:  Hyperinflation. Chronic lung changes. No consolidation, pneumothorax or effusion. No edema. Normal cardiopericardial silhouette. Slight increased density to the extreme upper mediastinum. Please correlate for known history of enlarged thyroid  gland. There is a nodular density along the lower left hemithorax along the anterior aspect of the left fifth rib. IMPRESSION: Hyperinflation with chronic changes. Asymmetric density in the lower left thorax. Based on the prior PET-CT this could be an area of nodular opacity of the lung. Recommend follow-up. Electronically Signed   By: Ranell Bring M.D.   On: 06/05/2024 14:16    ASSESSMENT & PLAN Joshua Compton. Is a 84 y.o. male who presents to the rapid diagnostic clinic for evaluation of left lung soft tissue mass and left rib fracture.   #Metastatic adenocarcinoma involving bone and muscle, unknown primary --  Seen on CT chest from 04/16/2024. --Underwent biopsy of soft tissue mass involving the left hip.  Pathology of the soft tissue mass involving the left hip was consistent with metastatic adenocarcinoma, moderately differentiated with possible primaries including pancreatobiliary tract and GI tract.   -- PET scan from 04/29/2024 did not clearly meet a primary malignancy. -- Underwent EGD and colonoscopy on 05/23/2024 without any evidence of malignancy. -- Discussed treatment of unknown primary which includes chemotherapy with carboplatin and paclitaxel every 3 weeks.  We reviewed the dose, frequency, side effects with the patient. --Patient will need a Port-A-Cath to administer chemotherapy --Will request molecular testing with [facility].  -- Tentative plan to start cycle 1, day 1 of Carbo/Taxol in 1 to 2 weeks.  #Bone metastases: #Cancer related pain: -- Started palliative radiation to T4 spine and left chest on 05/30/2024.  Target goals 30 Gray delivered in 10 fractions. --Current pain regimen includes Xtampza  18 mg (12 hours and oxycodone  10 mg  every 4-6 hours as needed for breakthrough pain.2 tablets)  --Will make referral to palliative care to help with pain management and discuss goals of care  #Thyroid  mass: --Pathology of thyroid  mass was positive for malignancy but there was not enough tissue to differentiate metastatic carcinoma versus primary thyroid  malignancy. --Planned for thyroid  lobectomy on 06/27/2024 to evaluate for metastatic disease versus primary thyroid  malignancy.  #Prostate cancer: --S/p brachytherapy >10-15 years ago per patient. No records available for review.   #Age appropriate cancer screens: --Colonoscopy/EGD 05/2024-negative for malignancy --Last PSA on record is from 07/2023,  <0.1    Orders Placed This Encounter  Procedures   IR IMAGING GUIDED PORT INSERTION    Port placement(original req stated IR Perc Tun Perit Cath w/port) HTA Eliquis  Arrive: 9:30    Standing Status:   Future    Expiration Date:   06/07/2025    Reason for Exam (SYMPTOM  OR DIAGNOSIS REQUIRED):   Port placements    Preferred Imaging Location?:   [facility]   Amb Referral to Palliative Care    Referral Priority:   Urgent    Referral Type:   Consultation    Referral Reason:   Symptom Managment    Number of Visits Requested:   1    All questions were answered. The patient knows to call the clinic with any problems, questions or concerns.  I have spent a total of 40 minutes minutes of face-to-face and non-face-to-face time, preparing to see the patient, performing a medically appropriate examination, counseling and educating the patient, ordering medications/tests/procedures, referring and communicating with other health care professionals, documenting clinical information in the electronic health record, independently interpreting results and communicating results to the patient, and care coordination.   Johnston Police, PA-C Department of Hematology/Oncology [facility] at [facility] Phone:  [phone]  Patient was see with Dr. Federico  I have read the above note and personally examined the patient. I agree with the assessment and plan as noted above.  Briefly Joshua Compton is an 83 year old male who presents with metastatic adenocarcinoma of unclear origin.  At this time would recommend treating this as an unknown primary.  We are attempting to get a surgery scheduled for a larger biopsy but this has not yet been performed.  Due to progression of his disease and worsening symptoms would recommend proceeding with chemotherapy.  Will plan to begin with carboplatin and paclitaxel to broadly treat this malignancy.  If biopsy results provide a more  specific cancer we can always alter the regimen.  Today we discussed the risks and benefits of this regimen our findings, and plan moving forward.  He voices understanding and was willing and able to proceed with treatment.  He will get this set up as soon as feasible.   Norleen IVAR Kidney, MD Department of Hematology/Oncology [facility] at [facility] Phone: [phone] Pager: [phone] Email: [email or web address]

## 2024-06-09 NOTE — ED Provider Notes (Signed)
 Patient with elevated troponin.  I spoke to cardiology Dr. Delford and he will consult on the patient but recommends patient be admitted to medicine and follow his troponins and get echo   Suzette Pac, MD 06/09/24 [id number]

## 2024-06-09 NOTE — Consult Note (Signed)
 CARDIOLOGY CONSULT NOTE       Patient ID: Joshua Compton. MRN: [id number] DOB/AGE: [birth year] 83 y.o.  Admit date: 06/09/2024 Referring Physician: Zamit Primary Physician: [name], MD Primary Cardiologist: Alvan Reason for Consultation: Chest Pain Elevated Troponin  Principal Problem:   Chest pain   HPI:  83 y.o. seen in ED for chest pain and elevated troponin 504.  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 He is taking narcotics for pain control He has started palliative radiation a week ago. ? Primary as thyroid  with biopsy last month He is to have thyroid  surgery with Dr Eletha 06/27/24.   He has history of PAF, CVA, HFmrEF, HLD, HTN. He has been in NSR No history of CAD but no stress testing done  TTE dopne 12/16/23 with EF 45-50% normal RV and AV sclerosis. Pain in chest is at rest as well as limited activity and seems more skeletal. His ECG in ER is totally normal in NSR  CXR with NSR  ER contemplated doing CTA to r/o PE but he is allergic to contrast and just had CTA with preRx 06/05/24 and No PE Noted 3.6 cm thyroid  mass and lytic mets in left 4 th rib  ROS All other systems reviewed and negative except as noted above  Past Medical History:  Diagnosis Date   Acute heart failure with preserved ejection fraction (HFpEF) (HCC) 09/05/2023   Acute HFrEF (heart failure with reduced ejection fraction) (HCC) 09/08/2023   Acute respiratory failure with hypoxia (HCC) 09/05/2023   Allergy    Arthritis    Cancer (HCC)    prostate ca   seed implants   CHF (congestive heart failure) (HCC)    GERD (gastroesophageal reflux disease)    Glaucoma    Hyperlipidemia    Hypertension    dr cyrena     [facility]   fm   Stroke [facility]) 07/2023    Family History  Problem Relation Age of Onset   Cancer Mother        breast   Heart disease Father    Stroke Sister    Hypertension  Sister    Healthy Sister    Heart attack Sister    Other Sister        unknown cause   Cancer Sister    Other Brother        multiple back surgeries   Hypertension Brother    Throat cancer Brother    Cancer Brother        groin related cancer   Heart attack Brother    Atrial fibrillation Daughter    Insurance account manager Parkinson White syndrome Daughter    Cancer Son        luekimia   Heart disease Paternal Uncle    Other Brother        suicide-   Colon cancer Neg Hx    Rectal cancer Neg Hx    Stomach cancer Neg Hx     Social History   Socioeconomic History   Marital status: Married    Spouse name: Not on file   Number of children: 5   Years of education: 11   Highest education level: 11th grade  Occupational History   Occupation: Nutritional therapist    Comment: retired  Tobacco Use   Smoking status: Former    Current packs/day: 0.00    Average packs/day: 0.5 packs/day  for 30.0 years (15.0 ttl pk-yrs)    Types: Cigarettes    Start date: 03/29/1975    Quit date: 03/28/2005    Years since quitting: 19.2   Smokeless tobacco: Never  Vaping Use   Vaping status: Never Used  Substance and Sexual Activity   Alcohol use: No   Drug use: Never   Sexual activity: Not Currently    Birth control/protection: None  Other Topics Concern   Not on file  Social History Narrative   Married. 1 daughter and 1 son from current marriage. 3 daughters from prior marriage. 10 grandkids      Retired Proofreader: fishing, golf, enjoys being outdoors   Social Drivers of Corporate investment banker Strain: Low Risk  (05/26/2024)   Overall Financial Resource Strain (CARDIA)    Difficulty of Paying Living Expenses: Not hard at all  Food Insecurity: No Food Insecurity (05/26/2024)   Hunger Vital Sign    Worried About Running Out of Food in the Last Year: Never true    Ran Out of Food in the Last Year: Never true  Transportation Needs: No Transportation Needs  (05/26/2024)   PRAPARE - Administrator, Civil Service (Medical): No    Lack of Transportation (Non-Medical): No  Physical Activity: Inactive (05/26/2024)   Exercise Vital Sign    Days of Exercise per Week: 0 days    Minutes of Exercise per Session: Not on file  Stress: No Stress Concern Present (05/26/2024)   [employer] of Occupational Health - Occupational Stress Questionnaire    Feeling of Stress: Not at all  Social Connections: Moderately Isolated (05/26/2024)   Social Connection and Isolation Panel    Frequency of Communication with Friends and Family: Once a week    Frequency of Social Gatherings with Friends and Family: Once a week    Attends Religious Services: Patient declined    Active Member of Clubs or Organizations: Yes    Attends Banker Meetings: 1 to 4 times per year    Marital Status: Married  Catering manager Violence: Not At Risk (09/05/2023)   Humiliation, Afraid, Rape, and Kick questionnaire    Fear of Current or Ex-Partner: No    Emotionally Abused: No    Physically Abused: No    Sexually Abused: No    Past Surgical History:  Procedure Laterality Date   BACK SURGERY     x2   COLONOSCOPY     EYE SURGERY     cataract   HERNIA REPAIR  11/06/2009   left inguinal repair    NECK SURGERY     ROTATOR CUFF REPAIR Left 11/2014   TOTAL HIP ARTHROPLASTY Right 04/20/2020   Procedure: RIGHT TOTAL HIP ARTHROPLASTY ANTERIOR APPROACH;  Surgeon: Jerri Kay HERO, MD;  Location: [facility] OR;  Service: Orthopedics;  Laterality: Right;   TOTAL HIP ARTHROPLASTY Left 01/07/2021   Procedure: LEFT TOTAL HIP ARTHROPLASTY ANTERIOR APPROACH;  Surgeon: Jerri Kay HERO, MD;  Location: [facility] OR;  Service: Orthopedics;  Laterality: Left;   TRANSURETHRAL RESECTION OF PROSTATE        Current Facility-Administered Medications:    0.9 %  sodium chloride  infusion, , Intravenous, Continuous, Emokpae, Courage, MD   0.9 %  sodium chloride  infusion, , Intravenous, PRN,  Emokpae, Courage, MD   acetaminophen  (TYLENOL ) tablet 650 mg, 650 mg, Oral, Q6H PRN **OR** acetaminophen  (TYLENOL ) suppository 650 mg, 650 mg, Rectal, Q6H PRN, Pearlean Manus, MD  amiodarone  (PACERONE ) tablet 200 mg, 200 mg, Oral, Daily, Emokpae, Courage, MD   apixaban  (ELIQUIS ) tablet 2.5 mg, 2.5 mg, Oral, BID, Emokpae, Courage, MD   atorvastatin  (LIPITOR) tablet 40 mg, 40 mg, Oral, Daily, Emokpae, Courage, MD   bisacodyl  (DULCOLAX) suppository 10 mg, 10 mg, Rectal, Daily PRN, Emokpae, Courage, MD   budesonide -glycopyrrolate -formoterol  (BREZTRI ) 160-9-4.8 MCG/ACT inhaler 2 puff, 2 puff, Inhalation, BID, Emokpae, Courage, MD   cholecalciferol  (VITAMIN D3) 25 MCG (1000 UNIT) tablet 1,000 Units, 1,000 Units, Oral, Daily, Emokpae, Courage, MD   diphenhydrAMINE  (BENADRYL ) capsule 50 mg, 50 mg, Oral, Once **OR** diphenhydrAMINE  (BENADRYL ) injection 50 mg, 50 mg, Intravenous, Once, Emokpae, Courage, MD   dorzolamide -timolol  (COSOPT ) 2-0.5 % ophthalmic solution 1 drop, 1 drop, Both Eyes, Daily, Emokpae, Courage, MD   insulin  aspart (novoLOG ) injection 0-5 Units, 0-5 Units, Subcutaneous, QHS, Emokpae, Courage, MD   insulin  aspart (novoLOG ) injection 0-6 Units, 0-6 Units, Subcutaneous, TID WC, Emokpae, Courage, MD   metoprolol  succinate (TOPROL -XL) 24 hr tablet 12.5 mg, 12.5 mg, Oral, Daily, Emokpae, Courage, MD   ondansetron  (ZOFRAN ) tablet 4 mg, 4 mg, Oral, Q6H PRN **OR** ondansetron  (ZOFRAN ) injection 4 mg, 4 mg, Intravenous, Q6H PRN, Emokpae, Courage, MD   oxyCODONE  (OXYCONTIN ) 12 hr tablet 10 mg, 10 mg, Oral, Q12H, Emokpae, Courage, MD   Oxycodone  HCl TABS 10 mg, 10 mg, Oral, Q6H PRN, Emokpae, Courage, MD   pantoprazole  (PROTONIX ) EC tablet 40 mg, 40 mg, Oral, Daily, Emokpae, Courage, MD   polyethylene glycol (MIRALAX  / GLYCOLAX ) packet 17 g, 17 g, Oral, Daily PRN, Emokpae, Courage, MD   [START ON 06/10/2024] predniSONE  (DELTASONE ) tablet 50 mg, 50 mg, Oral, Q breakfast, Emokpae, Courage, MD    rOPINIRole  (REQUIP ) tablet 1 mg, 1 mg, Oral, TID, Emokpae, Courage, MD   [START ON 06/11/2024] sacubitril -valsartan  (ENTRESTO ) 24-26 mg per tablet, 1 tablet, Oral, BID, Emokpae, Courage, MD   sodium chloride  flush (NS) 0.9 % injection 3 mL, 3 mL, Intravenous, Q12H, Emokpae, Courage, MD   sodium chloride  flush (NS) 0.9 % injection 3 mL, 3 mL, Intravenous, Q12H, Emokpae, Courage, MD   sodium chloride  flush (NS) 0.9 % injection 3 mL, 3 mL, Intravenous, PRN, Emokpae, Courage, MD   traZODone  (DESYREL ) tablet 50 mg, 50 mg, Oral, QHS PRN, Emokpae, Courage, MD  Current Outpatient Medications:    amiodarone  (PACERONE ) 200 MG tablet, Take 1 tablet (200 mg total) by mouth daily., Disp: 90 tablet, Rfl: 3   apixaban  (ELIQUIS ) 2.5 MG TABS tablet, Take 1 tablet (2.5 mg total) by mouth 2 (two) times daily., Disp: 60 tablet, Rfl: 11   atorvastatin  (LIPITOR) 40 MG tablet, Take 1 tablet (40 mg total) by mouth daily., Disp: 90 tablet, Rfl: 1   cholecalciferol  (VITAMIN D3) 25 MCG (1000 UNIT) tablet, TAKE 1 TABLET BY MOUTH EVERY DAY, Disp: 90 tablet, Rfl: 1   dorzolamide -timolol  (COSOPT ) 22.3-6.8 MG/ML ophthalmic solution, Place 1 drop into both eyes daily. , Disp: , Rfl:    metoprolol  succinate (TOPROL -XL) 25 MG 24 hr tablet, TAKE 0.5 TABLETS (12.5 MG TOTAL) BY MOUTH DAILY. FOR BLOOD PRESSURE CONTROL, Disp: 45 tablet, Rfl: 1   oxyCODONE  ER (XTAMPZA  ER) 9 MG C12A, Take 9 mg by mouth every 12 (twelve) hours as needed., Disp: 60 capsule, Rfl: 0   Oxycodone  HCl 10 MG TABS, Take 1 tablet (10 mg total) by mouth every 6 (six) hours as needed for up to 120 doses., Disp: 120 tablet, Rfl: 0   pantoprazole  (PROTONIX ) 40 MG tablet, Take 1 tablet (40 mg  total) by mouth daily. For heartburn, Disp: 90 tablet, Rfl: 3   rOPINIRole  (REQUIP ) 1 MG tablet, Take 1 tablet (1 mg total) by mouth 3 (three) times daily. Plus an extra tablet at bedtime to total four a day, Disp: 360 tablet, Rfl: 3   sacubitril -valsartan  (ENTRESTO ) 24-26 MG, Take 1  tablet by mouth 2 (two) times daily., Disp: 60 tablet, Rfl: 3   Fluticasone -Umeclidin-Vilant (TRELEGY ELLIPTA ) 100-62.5-25 MCG/ACT AEPB, Inhale 1 puff into the lungs daily. (Patient not taking: Reported on 06/09/2024), Disp: 28 each, Rfl: 5  amiodarone   200 mg Oral Daily   apixaban   2.5 mg Oral BID   atorvastatin   40 mg Oral Daily   budesonide -glycopyrrolate -formoterol   2 puff Inhalation BID   cholecalciferol   1,000 Units Oral Daily   diphenhydrAMINE   50 mg Oral Once   Or   diphenhydrAMINE   50 mg Intravenous Once   dorzolamide -timolol   1 drop Both Eyes Daily   insulin  aspart  0-5 Units Subcutaneous QHS   insulin  aspart  0-6 Units Subcutaneous TID WC   metoprolol  succinate  12.5 mg Oral Daily   oxyCODONE   10 mg Oral Q12H   pantoprazole   40 mg Oral Daily   [START ON 06/10/2024] predniSONE   50 mg Oral Q breakfast   rOPINIRole   1 mg Oral TID   [START ON 06/11/2024] sacubitril -valsartan   1 tablet Oral BID   sodium chloride  flush  3 mL Intravenous Q12H   sodium chloride  flush  3 mL Intravenous Q12H    sodium chloride      sodium chloride       Physical Exam: Blood pressure (!) 166/59, pulse 64, temperature 98.4 F (36.9 C), temperature source Oral, resp. rate 13, height 6' (1.829 m), weight 87.1 kg, SpO2 97%.    Chronically ill black male Decreased BS bilateral no active wheezing JVP normal  SEM AV sclerosis Pain to palpation over sternum and ribs L>R Post bilateral THR;s  Chronic LE edema plus 1-2 with stasis and mild skin breakdown  Labs:   Lab Results  Component Value Date   WBC 7.0 06/09/2024   HGB 12.2 (L) 06/09/2024   HCT 38.8 (L) 06/09/2024   MCV 96.8 06/09/2024   PLT 177 06/09/2024    Recent Labs  Lab 06/09/24 0641  NA 139  K 3.9  CL 105  CO2 25  BUN 20  CREATININE 1.31*  CALCIUM  9.5  PROT 7.1  BILITOT 1.1  ALKPHOS 262*  ALT 22  AST 26  GLUCOSE 96   Lab Results  Component Value Date   CKTOTAL 254 (H) 04/12/2024    Lab Results  Component Value Date    CHOL 141 09/18/2023   CHOL 157 07/28/2023   CHOL 138 12/16/2022   Lab Results  Component Value Date   HDL 62 09/18/2023   HDL 64 07/28/2023   HDL 58 12/16/2022   Lab Results  Component Value Date   LDLCALC 66 09/18/2023   LDLCALC 77 07/28/2023   LDLCALC 63 12/16/2022   Lab Results  Component Value Date   TRIG 60 09/18/2023   TRIG 87 07/28/2023   TRIG 91 12/16/2022   Lab Results  Component Value Date   CHOLHDL 2.3 09/18/2023   CHOLHDL 2.5 07/28/2023   CHOLHDL 2.4 12/16/2022   No results found for: LDLDIRECT    Radiology: [facility] Chest Port 1 View Result Date: 06/09/2024 CLINICAL DATA:  Chest pains radiating posteriorly. EXAM: PORTABLE CHEST 1 VIEW COMPARISON:  CTA chest 06/05/2024 FINDINGS: Emphysematous and chronic changes of the  lungs. Focal left lower lobe opacity on the prior study is not seen today. Heart size and vascular pattern are normal. Mediastinum is stable with aortic tortuosity and atherosclerosis. The bones are demineralized. IMPRESSION: 1. Emphysematous and chronic changes of the lungs. No acute chest findings. 2. Aortic atherosclerosis. Electronically Signed   By: Francis Quam M.D.   On: 06/09/2024 07:08   CT Angio Chest PE W and/or Wo Contrast Result Date: 06/05/2024 EXAM: CTA of the Chest with contrast for PE 06/05/2024 07:47:26 PM TECHNIQUE: CTA of the chest was performed after the administration of intravenous contrast. Multiplanar reformatted images are provided for review. MIP images are provided for review. Automated exposure control, iterative reconstruction, and/or weight based adjustment of the mA/kV was utilized to reduce the radiation dose to as low as reasonably achievable. COMPARISON: PET/CT dated 04/29/2024. CLINICAL HISTORY: Pulmonary embolism (PE) suspected, low to intermediate prob, positive D-dimer. FINDINGS: PULMONARY ARTERIES: Pulmonary arteries are adequately opacified for evaluation. No evidence of pulmonary embolism. MEDIASTINUM: 3.6 cm right  thyroid  mass, corresponding to the patient's biopsy proven thyroid  versus metastatic malignancy. Moderate 3-vessel coronary atherosclerosis. LYMPH NODES: No suspicious mediastinal lymphadenopathy on CT. LUNGS AND PLEURA: Moderate centrilobular and paraseptal emphysematous changes, upper lung predominant. Mild patchy posterior lower lobe opacities, likely atelectasis. No pneumothorax. UPPER ABDOMEN: Simple left renal cysts measuring up to 4.9 cm, benign (Bosniak 1). No follow up is recommended. SOFT TISSUES AND BONES: Lytic metastasis involving the left posterior fourth rib, without pathologic fracture. Mild degenerative changes of the visualized thoracolumbar spine. AORTA: Although not tailored for evaluation of the thoracic aorta, there is no evidence of thoracic aortic aneurysm or dissection. Thoracic aortic atherosclerosis. IMPRESSION: 1. No evidence of pulmonary embolism. 2. 3.6 cm right thyroid  mass, corresponding to the patient's biopsy proven primary vs metastatic malignancy. 3. Lytic metastasis involving the left posterior fourth rib, without pathologic fracture. Electronically signed by: Pinkie Pebbles MD 06/05/2024 08:02 PM EDT RP Workstation: [id number]   DG Chest 2 View Result Date: 06/05/2024 CLINICAL DATA:  Chest pain EXAM: CHEST - 2 VIEW COMPARISON:  X-ray 04/16/2024. PET-CT 04/29/2024 FINDINGS: Hyperinflation. Chronic lung changes. No consolidation, pneumothorax or effusion. No edema. Normal cardiopericardial silhouette. Slight increased density to the extreme upper mediastinum. Please correlate for known history of enlarged thyroid  gland. There is a nodular density along the lower left hemithorax along the anterior aspect of the left fifth rib. IMPRESSION: Hyperinflation with chronic changes. Asymmetric density in the lower left thorax. Based on the prior PET-CT this could be an area of nodular opacity of the lung. Recommend follow-up. Electronically Signed   By: Ranell Bring M.D.   On:  06/05/2024 14:16    EKG: NSR normal ECG   ASSESSMENT AND PLAN:   Chest Pain: no history of CAD and normal ECG History of mildly decreased EF 45-50% by TTE 12/15/24. Pain seems more related to bone mets that he has been taking narcotics for. However troponin elevated 500. Given palliative nature of his condition would not recommend cath especially with contrast allergy. Update TTE, trend troponins. And follow ECG If troponins fall, ECG stays normal and echo no change can consider outpatient lexiscan myovue to risk stratify only because he is pre surgery with Dr Eletha for 8/11 to remove his thyroid  mass.  PAF:  maintaining NSR on low dose eliquis  so no need for heparin  continue amiodarone   CHF:  euvolemic mild chronic LE edema continue entresto  and beta blocker Consider PRN lasix  for LE edema  Signed: Maude Emmer 06/09/2024,  9:25 AM

## 2024-06-09 NOTE — ED Provider Notes (Signed)
 [facility]-EMERGENCY DEPT [facility] Emergency Department Provider Note MRN:  [id number]  Arrival date & time: 06/09/24     Chief Complaint   Chest Pain   History of Present Illness   Joshua Wich. is a 83 y.o. year-old male with a history of prostate cancer, CHF, stroke presenting to the ED with chief complaint of chest pain.  Increased chest pain this morning center of the chest radiating to the mid thoracic back.  Review of Systems  A thorough review of systems was obtained and all systems are negative except as noted in the HPI and PMH.   Patient's Health History    Past Medical History:  Diagnosis Date   Acute heart failure with preserved ejection fraction (HFpEF) (HCC) 09/05/2023   Acute HFrEF (heart failure with reduced ejection fraction) (HCC) 09/08/2023   Acute respiratory failure with hypoxia (HCC) 09/05/2023   Allergy    Arthritis    Cancer (HCC)    prostate ca   seed implants   CHF (congestive heart failure) (HCC)    GERD (gastroesophageal reflux disease)    Glaucoma    Hyperlipidemia    Hypertension    dr cyrena     [facility]   fm   Stroke [facility]) 07/2023    Past Surgical History:  Procedure Laterality Date   BACK SURGERY     x2   COLONOSCOPY     EYE SURGERY     cataract   HERNIA REPAIR  11/06/2009   left inguinal repair    NECK SURGERY     ROTATOR CUFF REPAIR Left 11/2014   TOTAL HIP ARTHROPLASTY Right 04/20/2020   Procedure: RIGHT TOTAL HIP ARTHROPLASTY ANTERIOR APPROACH;  Surgeon: Jerri Kay HERO, MD;  Location: [facility] OR;  Service: Orthopedics;  Laterality: Right;   TOTAL HIP ARTHROPLASTY Left 01/07/2021   Procedure: LEFT TOTAL HIP ARTHROPLASTY ANTERIOR APPROACH;  Surgeon: Jerri Kay HERO, MD;  Location: [facility] OR;  Service: Orthopedics;  Laterality: Left;   TRANSURETHRAL RESECTION OF PROSTATE      Family History  Problem Relation Age of Onset   Cancer Mother        breast   Heart disease Father    Stroke Sister    Hypertension Sister    Healthy  Sister    Heart attack Sister    Other Sister        unknown cause   Cancer Sister    Other Brother        multiple back surgeries   Hypertension Brother    Throat cancer Brother    Cancer Brother        groin related cancer   Heart attack Brother    Atrial fibrillation Daughter    Insurance account manager Parkinson White syndrome Daughter    Cancer Son        luekimia   Heart disease Paternal Uncle    Other Brother        suicide-   Colon cancer Neg Hx    Rectal cancer Neg Hx    Stomach cancer Neg Hx     Social History   Socioeconomic History   Marital status: Married    Spouse name: Not on file   Number of children: 5   Years of education: 11   Highest education level: 11th grade  Occupational History   Occupation: Nutritional therapist    Comment: retired  Tobacco Use   Smoking status: Former    Current packs/day: 0.00    Average packs/day: 0.5 packs/day  for 30.0 years (15.0 ttl pk-yrs)    Types: Cigarettes    Start date: 03/29/1975    Quit date: 03/28/2005    Years since quitting: 19.2   Smokeless tobacco: Never  Vaping Use   Vaping status: Never Used  Substance and Sexual Activity   Alcohol use: No   Drug use: Never   Sexual activity: Not Currently    Birth control/protection: None  Other Topics Concern   Not on file  Social History Narrative   Married. 1 daughter and 1 son from current marriage. 3 daughters from prior marriage. 10 grandkids      Retired Proofreader: fishing, golf, enjoys being outdoors   Social Drivers of Corporate investment banker Strain: Low Risk  (05/26/2024)   Overall Financial Resource Strain (CARDIA)    Difficulty of Paying Living Expenses: Not hard at all  Food Insecurity: No Food Insecurity (05/26/2024)   Hunger Vital Sign    Worried About Running Out of Food in the Last Year: Never true    Ran Out of Food in the Last Year: Never true  Transportation Needs: No Transportation Needs (05/26/2024)   PRAPARE -  Administrator, Civil Service (Medical): No    Lack of Transportation (Non-Medical): No  Physical Activity: Inactive (05/26/2024)   Exercise Vital Sign    Days of Exercise per Week: 0 days    Minutes of Exercise per Session: Not on file  Stress: No Stress Concern Present (05/26/2024)   [employer] of Occupational Health - Occupational Stress Questionnaire    Feeling of Stress: Not at all  Social Connections: Moderately Isolated (05/26/2024)   Social Connection and Isolation Panel    Frequency of Communication with Friends and Family: Once a week    Frequency of Social Gatherings with Friends and Family: Once a week    Attends Religious Services: Patient declined    Database administrator or Organizations: Yes    Attends Banker Meetings: 1 to 4 times per year    Marital Status: Married  Catering manager Violence: Not At Risk (09/05/2023)   Humiliation, Afraid, Rape, and Kick questionnaire    Fear of Current or Ex-Partner: No    Emotionally Abused: No    Physically Abused: No    Sexually Abused: No     Physical Exam   Vitals:   06/09/24 0548  BP: (!) 152/65  Pulse: 65  Resp: 13  Temp: 98.4 F (36.9 C)  SpO2: 97%    CONSTITUTIONAL: Well-appearing, NAD NEURO/PSYCH:  Alert and oriented x 3, no focal deficits EYES:  eyes equal and reactive ENT/NECK:  no LAD, no JVD CARDIO: Regular rate, well-perfused, normal S1 and S2 PULM:  CTAB no wheezing or rhonchi GI/GU:  non-distended, non-tender MSK/SPINE:  No gross deformities, no edema SKIN:  no rash, atraumatic   *Additional and/or pertinent findings included in MDM below  Diagnostic and Interventional Summary    EKG Interpretation Date/Time:    Ventricular Rate:    PR Interval:    QRS Duration:    QT Interval:    QTC Calculation:   R Axis:      Text Interpretation:         Labs Reviewed  CBC  COMPREHENSIVE METABOLIC PANEL WITH GFR  LIPASE, BLOOD  TROPONIN I (HIGH SENSITIVITY)     CT Angio Chest Pulmonary Embolism (PE) W or WO Contrast    (Results Pending)  DG Chest Port 1 View    (Results Pending)    Medications  morphine  (PF) 4 MG/ML injection 4 mg (has no administration in time range)  methylPREDNISolone  sodium succinate (SOLU-MEDROL ) 40 mg/mL injection 40 mg (has no administration in time range)  diphenhydrAMINE  (BENADRYL ) capsule 50 mg (has no administration in time range)    Or  diphenhydrAMINE  (BENADRYL ) injection 50 mg (has no administration in time range)     Procedures  /  Critical Care Procedures  ED Course and Medical Decision Making  Initial Impression and Ddx Differential diagnosis includes PE, ACS, MSK.  New cancer diagnosis.  Says he missed 1 or 2 doses of his blood thinner.  Will need scan to exclude PE.  Past medical/surgical history that increases complexity of ED encounter: Adenocarcinoma  Interpretation of Diagnostics I personally reviewed the EKG and my interpretation is as follows: Sinus rhythm without ischemic findings  Labs, CT pending  Patient Reassessment and Ultimate Disposition/Management     Contrast allergy will pretreat.  Signed out to default provider at shift change.  Patient management required discussion with the following services or consulting groups:  None  Complexity of Problems Addressed Acute illness or injury that poses threat of life of bodily function  Additional Data Reviewed and Analyzed Further history obtained from: None  Additional Factors Impacting ED Encounter Risk Use of parenteral controlled substances  Nikoleta Dady M. Theadore, MD [facility] Emergency Medicine [facility] [email or web address]  Final Clinical Impressions(s) / ED Diagnoses     ICD-10-CM   1. Chest pain, unspecified type  R07.9       ED Discharge Orders     None        Discharge Instructions Discussed with and Provided to Patient:   Discharge Instructions   None      Theadore Ozell HERO, MD 06/09/24  [phone]

## 2024-06-09 NOTE — Progress Notes (Signed)
*  PRELIMINARY RESULTS* Echocardiogram 2D Echocardiogram has been performed.  Joshua Compton 06/09/2024, 3:12 PM

## 2024-06-09 NOTE — ED Notes (Signed)
 Attempted IV x2 and unsuccessful. Charge nurse notified.

## 2024-06-09 NOTE — Plan of Care (Signed)
  Problem: Education: Goal: Knowledge of General Education information will improve Description: Including pain rating scale, medication(s)/side effects and non-pharmacologic comfort measures Outcome: Progressing   Problem: Health Behavior/Discharge Planning: Goal: Ability to manage health-related needs will improve Outcome: Progressing   Problem: Activity: Goal: Risk for activity intolerance will decrease Outcome: Progressing   Problem: Nutrition: Goal: Adequate nutrition will be maintained Outcome: Progressing   Problem: Coping: Goal: Level of anxiety will decrease Outcome: Progressing   Problem: Elimination: Goal: Will not experience complications related to urinary retention Outcome: Progressing   Problem: Pain Managment: Goal: General experience of comfort will improve and/or be controlled Outcome: Progressing   Problem: Safety: Goal: Ability to remain free from injury will improve Outcome: Progressing   Problem: Skin Integrity: Goal: Risk for impaired skin integrity will decrease Outcome: Progressing

## 2024-06-09 NOTE — Progress Notes (Signed)
 START OFF PATHWAY REGIMEN - Other   [id number]:Carboplatin AUC=5 IV D1 + Paclitaxel 135 mg/m2 IV D1 q21 Days:   A cycle is every 21 days:     Paclitaxel      Carboplatin   **Always confirm dose/schedule in your pharmacy ordering system**  Patient Characteristics: Intent of Therapy: Non-Curative / Palliative Intent, Discussed with Patient

## 2024-06-10 ENCOUNTER — Other Ambulatory Visit: Payer: Self-pay | Admitting: Hematology and Oncology

## 2024-06-10 ENCOUNTER — Other Ambulatory Visit: Payer: Self-pay

## 2024-06-10 ENCOUNTER — Telehealth: Payer: Self-pay | Admitting: Hematology and Oncology

## 2024-06-10 ENCOUNTER — Ambulatory Visit

## 2024-06-10 ENCOUNTER — Encounter: Payer: Self-pay | Admitting: Physician Assistant

## 2024-06-10 ENCOUNTER — Other Ambulatory Visit

## 2024-06-10 DIAGNOSIS — I480 Paroxysmal atrial fibrillation: Secondary | ICD-10-CM | POA: Diagnosis present

## 2024-06-10 DIAGNOSIS — I351 Nonrheumatic aortic (valve) insufficiency: Secondary | ICD-10-CM | POA: Diagnosis not present

## 2024-06-10 DIAGNOSIS — C61 Malignant neoplasm of prostate: Secondary | ICD-10-CM | POA: Diagnosis not present

## 2024-06-10 DIAGNOSIS — R0789 Other chest pain: Secondary | ICD-10-CM | POA: Diagnosis not present

## 2024-06-10 DIAGNOSIS — I200 Unstable angina: Secondary | ICD-10-CM | POA: Diagnosis not present

## 2024-06-10 DIAGNOSIS — I5042 Chronic combined systolic (congestive) and diastolic (congestive) heart failure: Secondary | ICD-10-CM | POA: Diagnosis present

## 2024-06-10 DIAGNOSIS — C73 Malignant neoplasm of thyroid gland: Secondary | ICD-10-CM | POA: Diagnosis not present

## 2024-06-10 DIAGNOSIS — I214 Non-ST elevation (NSTEMI) myocardial infarction: Secondary | ICD-10-CM | POA: Diagnosis not present

## 2024-06-10 DIAGNOSIS — Z8546 Personal history of malignant neoplasm of prostate: Secondary | ICD-10-CM | POA: Diagnosis not present

## 2024-06-10 DIAGNOSIS — Z66 Do not resuscitate: Secondary | ICD-10-CM | POA: Diagnosis not present

## 2024-06-10 DIAGNOSIS — J432 Centrilobular emphysema: Secondary | ICD-10-CM | POA: Diagnosis not present

## 2024-06-10 DIAGNOSIS — I2510 Atherosclerotic heart disease of native coronary artery without angina pectoris: Secondary | ICD-10-CM

## 2024-06-10 DIAGNOSIS — J8410 Pulmonary fibrosis, unspecified: Secondary | ICD-10-CM | POA: Diagnosis present

## 2024-06-10 DIAGNOSIS — Z8249 Family history of ischemic heart disease and other diseases of the circulatory system: Secondary | ICD-10-CM | POA: Diagnosis not present

## 2024-06-10 DIAGNOSIS — E041 Nontoxic single thyroid nodule: Secondary | ICD-10-CM | POA: Diagnosis not present

## 2024-06-10 DIAGNOSIS — R7989 Other specified abnormal findings of blood chemistry: Secondary | ICD-10-CM | POA: Diagnosis not present

## 2024-06-10 DIAGNOSIS — I69351 Hemiplegia and hemiparesis following cerebral infarction affecting right dominant side: Secondary | ICD-10-CM | POA: Diagnosis not present

## 2024-06-10 DIAGNOSIS — R079 Chest pain, unspecified: Secondary | ICD-10-CM | POA: Diagnosis present

## 2024-06-10 DIAGNOSIS — Z7901 Long term (current) use of anticoagulants: Secondary | ICD-10-CM | POA: Diagnosis not present

## 2024-06-10 DIAGNOSIS — I080 Rheumatic disorders of both mitral and aortic valves: Secondary | ICD-10-CM | POA: Diagnosis not present

## 2024-06-10 DIAGNOSIS — N1831 Chronic kidney disease, stage 3a: Secondary | ICD-10-CM | POA: Diagnosis not present

## 2024-06-10 DIAGNOSIS — I5032 Chronic diastolic (congestive) heart failure: Secondary | ICD-10-CM | POA: Diagnosis not present

## 2024-06-10 DIAGNOSIS — E1122 Type 2 diabetes mellitus with diabetic chronic kidney disease: Secondary | ICD-10-CM | POA: Diagnosis not present

## 2024-06-10 DIAGNOSIS — I5043 Acute on chronic combined systolic (congestive) and diastolic (congestive) heart failure: Secondary | ICD-10-CM | POA: Insufficient documentation

## 2024-06-10 DIAGNOSIS — C7951 Secondary malignant neoplasm of bone: Secondary | ICD-10-CM | POA: Diagnosis not present

## 2024-06-10 DIAGNOSIS — Z515 Encounter for palliative care: Secondary | ICD-10-CM | POA: Diagnosis not present

## 2024-06-10 DIAGNOSIS — Z510 Encounter for antineoplastic radiation therapy: Secondary | ICD-10-CM | POA: Diagnosis not present

## 2024-06-10 DIAGNOSIS — Z79899 Other long term (current) drug therapy: Secondary | ICD-10-CM | POA: Diagnosis not present

## 2024-06-10 DIAGNOSIS — E785 Hyperlipidemia, unspecified: Secondary | ICD-10-CM | POA: Diagnosis present

## 2024-06-10 DIAGNOSIS — Z7982 Long term (current) use of aspirin: Secondary | ICD-10-CM | POA: Diagnosis not present

## 2024-06-10 DIAGNOSIS — C799 Secondary malignant neoplasm of unspecified site: Secondary | ICD-10-CM | POA: Diagnosis not present

## 2024-06-10 DIAGNOSIS — Z8673 Personal history of transient ischemic attack (TIA), and cerebral infarction without residual deficits: Secondary | ICD-10-CM | POA: Diagnosis not present

## 2024-06-10 DIAGNOSIS — I259 Chronic ischemic heart disease, unspecified: Secondary | ICD-10-CM | POA: Diagnosis not present

## 2024-06-10 DIAGNOSIS — I130 Hypertensive heart and chronic kidney disease with heart failure and stage 1 through stage 4 chronic kidney disease, or unspecified chronic kidney disease: Secondary | ICD-10-CM | POA: Diagnosis not present

## 2024-06-10 DIAGNOSIS — E1169 Type 2 diabetes mellitus with other specified complication: Secondary | ICD-10-CM | POA: Diagnosis present

## 2024-06-10 DIAGNOSIS — Z7189 Other specified counseling: Secondary | ICD-10-CM | POA: Diagnosis not present

## 2024-06-10 DIAGNOSIS — Z7902 Long term (current) use of antithrombotics/antiplatelets: Secondary | ICD-10-CM | POA: Diagnosis not present

## 2024-06-10 LAB — RENAL FUNCTION PANEL
Albumin: 3 g/dL — ABNORMAL LOW (ref 3.5–5.0)
Anion gap: 10 (ref 5–15)
BUN: 22 mg/dL (ref 8–23)
CO2: 22 mmol/L (ref 22–32)
Calcium: 9.3 mg/dL (ref 8.9–10.3)
Chloride: 107 mmol/L (ref 98–111)
Creatinine, Ser: 1.28 mg/dL — ABNORMAL HIGH (ref 0.61–1.24)
GFR, Estimated: 56 mL/min — ABNORMAL LOW (ref >60)
Glucose, Bld: 100 mg/dL — ABNORMAL HIGH (ref 70–99)
Phosphorus: 3.4 mg/dL (ref 2.5–4.6)
Potassium: 4.5 mmol/L (ref 3.5–5.1)
Sodium: 139 mmol/L (ref 135–145)

## 2024-06-10 LAB — GLUCOSE, CAPILLARY
Glucose-Capillary: 94 mg/dL (ref 70–99)
Glucose-Capillary: 114 mg/dL — ABNORMAL HIGH (ref 70–99)
Glucose-Capillary: 144 mg/dL — ABNORMAL HIGH (ref 70–99)
Glucose-Capillary: 128 mg/dL — ABNORMAL HIGH (ref 70–99)

## 2024-06-10 LAB — TROPONIN I (HIGH SENSITIVITY): Troponin I (High Sensitivity): 675 ng/L (ref <18)

## 2024-06-10 MED ORDER — KETOROLAC TROMETHAMINE 15 MG/ML IJ SOLN
15.0000 mg | Freq: Once | INTRAMUSCULAR | Status: AC
  Administered 2024-06-10: 15 mg via INTRAVENOUS
  Filled 2024-06-10: qty 1

## 2024-06-10 MED ORDER — HEPARIN SODIUM (PORCINE) 5000 UNIT/ML IJ SOLN
5000.0000 [IU] | Freq: Three times a day (TID) | INTRAMUSCULAR | Status: DC
  Administered 2024-06-11: 5000 [IU] via SUBCUTANEOUS
  Filled 2024-06-10: qty 1

## 2024-06-10 NOTE — Plan of Care (Signed)
  Problem: Education: Goal: Knowledge of General Education information will improve Description: Including pain rating scale, medication(s)/side effects and non-pharmacologic comfort measures Outcome: Progressing   Problem: Clinical Measurements: Goal: Diagnostic test results will improve Outcome: Progressing   Problem: Clinical Measurements: Goal: Respiratory complications will improve Outcome: Progressing   Problem: Clinical Measurements: Goal: Respiratory complications will improve Outcome: Progressing   Problem: Nutrition: Goal: Adequate nutrition will be maintained Outcome: Progressing   Problem: Coping: Goal: Level of anxiety will decrease Outcome: Progressing

## 2024-06-10 NOTE — Progress Notes (Addendum)
 Progress Note  Patient Name: Joshua Compton. Date of Encounter: 06/10/2024  [facility] Cardiologist: Joshua Carrier, MD   Patient Profile     Subjective   Complains of 8/10 CP this am with upward trending enzymes.  Inpatient Medications    Scheduled Meds:  amiodarone   200 mg Oral Daily   apixaban   2.5 mg Oral BID   atorvastatin   40 mg Oral Daily   budesonide -glycopyrrolate -formoterol   2 puff Inhalation BID   cholecalciferol   1,000 Units Oral Daily   dorzolamide -timolol   1 drop Both Eyes Daily   insulin  aspart  0-5 Units Subcutaneous QHS   insulin  aspart  0-6 Units Subcutaneous TID WC   metoprolol  succinate  12.5 mg Oral Daily   oxyCODONE   10 mg Oral Q12H   pantoprazole   40 mg Oral Daily   predniSONE   50 mg Oral Q breakfast   rOPINIRole   1 mg Oral TID   [START ON 06/11/2024] sacubitril -valsartan   1 tablet Oral BID   sodium chloride  flush  3 mL Intravenous Q12H   sodium chloride  flush  3 mL Intravenous Q12H   Continuous Infusions:  PRN Meds: acetaminophen  **OR** acetaminophen , bisacodyl , ondansetron  **OR** ondansetron  (ZOFRAN ) IV, mouth rinse, oxyCODONE , polyethylene glycol, sodium chloride  flush, traZODone    Vital Signs    Vitals:   06/09/24 2132 06/09/24 2300 06/10/24 0424 06/10/24 0800  BP: (!) 144/54  (!) 144/50 (!) 129/49  Pulse: 60     Resp:  12    Temp: 98.3 F (36.8 C)  98.3 F (36.8 C)   TempSrc: Oral  Oral   SpO2: 90%  92%   Weight:      Height:        Intake/Output Summary (Last 24 hours) at 06/10/2024 0950 Last data filed at 06/10/2024 0900 Gross per 24 hour  Intake 1333.9 ml  Output --  Net 1333.9 ml      06/09/2024    5:50 AM 06/07/2024   12:22 PM 06/05/2024   12:51 PM  Last 3 Weights  Weight (lbs) 192 lb 192 lb 7 oz 194 lb  Weight (kg) 87.091 kg 87.289 kg 87.998 kg      Telemetry    Normal sinus rhythm- Personally Reviewed  ECG    Normal sinus rhythm with no ST changes- Personally Reviewed  Physical Exam   GEN:  No acute distress.   Neck: No JVD Cardiac: RRR, no murmurs, rubs, or gallops.  Respiratory: Clear to auscultation bilaterally. GI: Soft, nontender, non-distended  MS: No edema; No deformity. Neuro:  Nonfocal  Psych: Normal affect   Labs    High Sensitivity Troponin:   Recent Labs  Lab 06/05/24 1524 06/09/24 0641 06/09/24 0859 06/09/24 1417 06/10/24 0426  TROPONINIHS 70* 504* 541* 606* 675*      Chemistry Recent Labs  Lab 06/05/24 1525 06/07/24 1135 06/09/24 0641 06/10/24 0426  NA 139 139 139 139  K 4.9 4.2 3.9 4.5  CL 100 103 105 107  CO2 28 30 25 22   GLUCOSE 100* 106* 96 100*  BUN 24* 26* 20 22  CREATININE 1.82* 1.62* 1.31* 1.28*  CALCIUM  9.7 9.4 9.5 9.3  PROT 7.4 6.9 7.1  --   ALBUMIN 3.7 3.7 3.5 3.0*  AST 29 24 26   --   ALT 38 26 22  --   ALKPHOS 127* 206* 262*  --   BILITOT 0.9 0.5 1.1  --   GFRNONAA 36* 42* 54* 56*  ANIONGAP 11 6 9  10  Hematology Recent Labs  Lab 06/05/24 1305 06/07/24 1135 06/09/24 0641  WBC 4.8 6.7 7.0  RBC 4.08* 3.85* 4.01*  HGB 12.3* 11.8* 12.2*  HCT 38.8* 36.4* 38.8*  MCV 95.1 94.5 96.8  MCH 30.1 30.6 30.4  MCHC 31.7 32.4 31.4  RDW 15.4 15.7* 15.8*  PLT 236 207 177    BNP Recent Labs  Lab 06/05/24 1320  BNP 79.5     DDimer  Recent Labs  Lab 06/05/24 1340 06/09/24 0859  DDIMER >20.00* >20.00*     CHA2DS2-VASc Score = 7   This indicates a 11.2% annual risk of stroke. The patient's score is based upon: CHF History: 1 HTN History: 1 Diabetes History: 0 Stroke History: 2 Vascular Disease History: 1 Age Score: 2 Gender Score: 0      Radiology    ECHOCARDIOGRAM COMPLETE Result Date: 06/09/2024    ECHOCARDIOGRAM REPORT   Patient Name:   Joshua Compton. Date of Exam: 06/09/2024 Medical Rec #:  [id number]             Height:       72.0 in Accession #:    [id number]            Weight:       192.0 lb Date of Birth:  31-Jan-1941             BSA:          2.094 m Patient Age:    83 years               BP:           149/62 mmHg Patient Gender: M                     HR:           68 bpm. Exam Location:  [facility] Procedure: 2D Echo, Cardiac Doppler and Color Doppler (Both Spectral and Color            Flow Doppler were utilized during procedure). Indications:    Chest Pain R07.9  History:        Patient has prior history of Echocardiogram examinations, most                 recent 12/16/2023. CHF, Stroke, Arrythmias:Atrial Fibrillation;                 Risk Factors:Hypertension and Dyslipidemia.  Sonographer:    Aida Pizza RCS Referring Phys: [phone] [facility]  Sonographer Comments: Suboptimal parasternal window. IMPRESSIONS  1. Global hypokinesis slightly worse in inferior wall . Left ventricular ejection fraction, by estimation, is 50 to 55%. The left ventricle has low normal function. The left ventricle demonstrates global hypokinesis. The left ventricular internal cavity  size was mildly dilated. There is mild left ventricular hypertrophy. Left ventricular diastolic parameters are consistent with Grade I diastolic dysfunction (impaired relaxation).  2. Right ventricular systolic function is normal. The right ventricular size is normal.  3. The mitral valve is abnormal. Mild mitral valve regurgitation. No evidence of mitral stenosis.  4. The aortic valve was not well visualized. There is moderate calcification of the aortic valve. There is moderate thickening of the aortic valve. Aortic valve regurgitation is mild to moderate. Aortic valve sclerosis/calcification is present, without any evidence of aortic stenosis.  5. The inferior vena cava is normal in size with greater than 50% respiratory variability, suggesting right atrial pressure of 3 mmHg. FINDINGS  Left Ventricle: Global hypokinesis  slightly worse in inferior wall. Left ventricular ejection fraction, by estimation, is 50 to 55%. The left ventricle has low normal function. The left ventricle demonstrates global hypokinesis. Strain was performed  and  the global longitudinal strain is indeterminate. The left ventricular internal cavity size was mildly dilated. There is mild left ventricular hypertrophy. Left ventricular diastolic parameters are consistent with Grade I diastolic dysfunction (impaired relaxation). Right Ventricle: The right ventricular size is normal. No increase in right ventricular wall thickness. Right ventricular systolic function is normal. Left Atrium: Left atrial size was normal in size. Right Atrium: Right atrial size was normal in size. Pericardium: There is no evidence of pericardial effusion. Mitral Valve: The mitral valve is abnormal. There is mild thickening of the mitral valve leaflet(s). There is mild calcification of the mitral valve leaflet(s). Mild mitral valve regurgitation. No evidence of mitral valve stenosis. Tricuspid Valve: The tricuspid valve is normal in structure. Tricuspid valve regurgitation is not demonstrated. No evidence of tricuspid stenosis. Aortic Valve: The aortic valve was not well visualized. There is moderate calcification of the aortic valve. There is moderate thickening of the aortic valve. Aortic valve regurgitation is mild to moderate. Aortic valve sclerosis/calcification is present, without any evidence of aortic stenosis. Aortic valve mean gradient measures 9.0 mmHg. Aortic valve peak gradient measures 16.2 mmHg. Aortic valve area, by VTI measures 2.26 cm. Pulmonic Valve: The pulmonic valve was normal in structure. Pulmonic valve regurgitation is not visualized. No evidence of pulmonic stenosis. Aorta: The aortic root is normal in size and structure. Venous: The inferior vena cava is normal in size with greater than 50% respiratory variability, suggesting right atrial pressure of 3 mmHg. IAS/Shunts: No atrial level shunt detected by color flow Doppler. Additional Comments: 3D was performed not requiring image post processing on an independent workstation and was indeterminate.  LEFT VENTRICLE PLAX  2D LVIDd:         5.70 cm   Diastology LVIDs:         4.20 cm   LV e' medial:    8.92 cm/s LV PW:         1.00 cm   LV E/e' medial:  5.8 LV IVS:        1.20 cm   LV e' lateral:   13.20 cm/s LVOT diam:     2.30 cm   LV E/e' lateral: 3.9 LV SV:         104 LV SV Index:   50 LVOT Area:     4.15 cm  RIGHT VENTRICLE RV S prime:     17.30 cm/s TAPSE (M-mode): 3.0 cm LEFT ATRIUM             Index        RIGHT ATRIUM           Index LA diam:        3.60 cm 1.72 cm/m   RA Area:     16.90 cm LA Vol (A2C):   80.4 ml 38.40 ml/m  RA Volume:   39.30 ml  18.77 ml/m LA Vol (A4C):   46.5 ml 22.21 ml/m LA Biplane Vol: 64.2 ml 30.66 ml/m  AORTIC VALVE AV Area (Vmax):    2.48 cm AV Area (Vmean):   2.21 cm AV Area (VTI):     2.26 cm AV Vmax:           201.00 cm/s AV Vmean:          142.000 cm/s AV VTI:  0.462 m AV Peak Grad:      16.2 mmHg AV Mean Grad:      9.0 mmHg LVOT Vmax:         120.00 cm/s LVOT Vmean:        75.400 cm/s LVOT VTI:          0.252 m LVOT/AV VTI ratio: 0.54  AORTA Ao Root diam: 4.00 cm MITRAL VALVE MV Area (PHT): 1.71 cm    SHUNTS MV Decel Time: 444 msec    Systemic VTI:  0.25 m MV E velocity: 51.50 cm/s  Systemic Diam: 2.30 cm MV A velocity: 84.20 cm/s MV E/A ratio:  0.61 Maude Emmer MD Electronically signed by Maude Emmer MD Signature Date/Time: 06/09/2024/3:19:25 PM    Final    CT Angio Chest Pulmonary Embolism (PE) W or WO Contrast Result Date: 06/09/2024 CLINICAL DATA:  Pulmonary embolism suspected, high probability history of prostate cancer EXAM: CT ANGIOGRAPHY CHEST WITH CONTRAST TECHNIQUE: Multidetector CT imaging of the chest was performed using the standard protocol during bolus administration of intravenous contrast. Multiplanar CT image reconstructions and MIPs were obtained to evaluate the vascular anatomy. RADIATION DOSE REDUCTION: This exam was performed according to the departmental dose-optimization program which includes automated exposure control, adjustment of the mA  and/or kV according to patient size and/or use of iterative reconstruction technique. CONTRAST:  75mL OMNIPAQUE  IOHEXOL  350 MG/ML SOLN COMPARISON:  Multiple priors dated back to September 05, 2023. FINDINGS: Cardiovascular: The heart size is normal. Atherosclerotic calcifications of coronary arteries. No pericardial fluid. Mediastinum/Nodes: Right thyroid  lobe nodule measuring 3.6 cm. No enlarged mediastinal, hilar, or axillary lymph nodes. Trachea, and esophagus demonstrate no significant findings. Lungs/Pleura: Severe upper lobe predominant centrilobular emphysematous changes. Bilateral lower lobe fibrotic changes with associated architectural distortion, the paraseptal emphysematous changes, bronchiectasis and bronchiolectasis. Bronchial and bronchiolar wall thickening. Left lower lobe subpleural consolidation/mass measuring 3.6 x 2.2 cm, stable to prior however gradually enlarging to September 05, 2023 which measured 1 x 1.4 cm. Multiple pulmonary nodules are stable to prior as follows: Right upper lobe ground-glass nodule in subpleural measuring 4 mm (4/66). Multiple perifissural nodules along the right minor fissure (4/73). These nodules are stable to prior. No new nodule.  No pleural effusion. Upper Abdomen: Multiple simple cortical cysts of the left kidney. Small hiatal hernia. Thickening of bilateral multiple subcutaneous adrenal gland query small nodules measuring 1 cm. Musculoskeletal: Lytic expansile osseous metastasis involving the posterior left fourth rib, stable to prior. Multiple soft tissue nodules in bilateral anterior chest wall for example image 4/100, 54. Additional small subcentimeter nodule in left breast region (4/60. Mild gynecomastia. Review of the MIP images confirms the above findings. IMPRESSION: No evidence of pulmonary embolus. Multiple pulmonary nodules, subcutaneous soft tissue nodules and left posterior fourth rib lytic expansile lesion, stable to prior. Follow-up according to  oncologic protocols. Stable right thyroid  nodule. 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 Left lower lobe subpleural consolidation/mass versus round atelectasis stable to prior, however gradually enlarging to September 05, 2023. Malignancy cannot be excluded. Recommend PET-CT for further assessment. Electronically Signed   By: Megan  Zare M.D.   On: 06/09/2024 12:51   DG Chest Port 1 View Result Date: 06/09/2024 CLINICAL DATA:  Chest pains radiating posteriorly. EXAM: PORTABLE CHEST 1 VIEW COMPARISON:  CTA chest 06/05/2024 FINDINGS: Emphysematous and chronic changes of the lungs. Focal left lower lobe opacity on the prior study is not seen today. Heart size and vascular pattern are normal. Mediastinum  is stable with aortic tortuosity and atherosclerosis. The bones are demineralized. IMPRESSION: 1. Emphysematous and chronic changes of the lungs. No acute chest findings. 2. Aortic atherosclerosis. Electronically Signed   By: Francis Quam M.D.   On: 06/09/2024 07:08    Patient Profile     83 y.o. male with a history ofPAF, CVA, HFmrEF, HLD, HTN. He has been in NSR No history of CAD but no stress testing done TTE dopne 12/16/23 with EF 45-50% normal RV and AV sclerosis.  Admitted with chest pain and an elevated troponin at 504.  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  Assessment & Plan    Chest pain -He has no history of CAD but does have a history of mild LV dysfunction EF 45 to 50% by echo 12/07/2023 -His pain is atypical and seems to be more related to bony mets that he has been taking narcotics for -Troponin was elevated at 500 on admit with a flat trend up to 675 -Repeat 2D echo yesterday showed EF 50 to 55% with global HK worse in the inferior walls, mild LVH, G1 DD, normal RV and mild to moderate AI - This morning  he is complaining of 8 out of 10 chest pain.  It is difficult for him to describe it but says it is not in his chest wall.  He does have some pain in his back but it is not what his typical back pain from his mets is like. - Although on 2D echo EF is improved from 45 to 50% now to 50 to 55% but now mentions more prominent inferior hypokinesis. - Given slightly uptrending troponin and ongoing chest pain I think we need to define his coronary anatomy. - He does have a history of CKD but his serum creatinine is 1.23 today.  Therefore I think we should send him down to [facility] for a left and possible cardiac cath. Informed Consent   Shared Decision Making/Informed Consent The risks [stroke (1 in 1000), death (1 in 1000), kidney failure [usually temporary] (1 in 500), bleeding (1 in 200), allergic reaction [possibly serious] (1 in 200)], benefits (diagnostic support and management of coronary artery disease) and alternatives of a cardiac catheterization were discussed in detail with Mr. Engen and he is willing to proceed. -Will arrange transfer to [name] today  -Will need to be off Eliquis  for 48 hours prior to left heart cath which we will plan for Monday -Would recommend course only cath given his history of CKD in the past -Hold Entresto  for now to protect kidneys for -Transition Eliquis  to IV heparin  drip per pharmacy -Start aspirin  81 mg daily -continue Toprol  XL 12.5 mg daily -Continue Crestor  40 mg daily  Paroxysmal atrial fibrillation -He is maintaining normal sinus rhythm -Continue Eliquis  2.5 mg twice daily dosed for age>80 and SCr>1.5 although his serum creatinine is normal today it has been as high as 2 earlier in the year and 1.6 when he came in -For now we will hold Eliquis  did not need for -Will consult pharmacy for IV heparin  drip  Chronic combined systolic/diastolic CHF - Last EF this admission 50 to 55% but had been 45 to 50% by echo 12/07/2023 - He does not  appear volume overloaded on exam today - Continue Toprol  XL 12.5 mg daily - Will hold  Entresto  due to borderline serum creatinine and need for cath on Monday - He is not on MRA or SGLT2 given history of AKI with serum creatinines as high as the 2's.  If renal function remains stable going forward could consider addition of these   I spent 35 minutes caring for this patient today face to face, ordering and reviewing labs, reviewing records from 2D echo from 12/07/2023 and 06/05/2024 , seeing the patient, documenting in the record, and arranging for a transfer to [facility] for left heart catheterization and possible PCI  For questions or updates, please contact [facility] Please consult [email or web address] for contact info under        Signed, Wilbert Bihari, MD  06/10/2024, 9:50 AM

## 2024-06-10 NOTE — TOC CM/SW Note (Signed)
 Transition of Care [facility]) - Inpatient Brief Assessment   Patient Details  Name: Joshua Compton. MRN: [id number] Date of Birth: 1941-06-30  Transition of Care [facility]) CM/SW Contact:    Noreen KATHEE Cleotilde ISRAEL Phone Number: 06/10/2024, 9:06 AM   Clinical Narrative:  Transition of Care Department [facility]) has reviewed patient and no TOC needs have been identified at this time. We will continue to monitor patient advancement through interdisciplinary progression rounds. If new patient transition needs arise, please place a TOC consult.  Transition of Care Asessment: Insurance and Status: Insurance coverage has been reviewed Patient has primary care physician: Yes Home environment has been reviewed: From home Prior level of function:: Independent Prior/Current Home Services: No current home services Social Drivers of Health Review: SDOH reviewed no interventions necessary Readmission risk has been reviewed: Yes Transition of care needs: no transition of care needs at this time

## 2024-06-10 NOTE — Progress Notes (Signed)
 PROGRESS NOTE  Joshua Compton, is a 83 y.o. male, DOB - 04-17-1941, FMW:[id number]  Admit date - 06/09/2024   Admitting Physician Bryceson Grape Pearlean, MD  Outpatient Primary MD for the patient is [name], MD  LOS - 0  Chief Complaint  Patient presents with   Chest Pain       Brief Narrative:  83 y.o. male with medical history significant for history of prostate cancer, HTN, prior strokes, HLD, COPD/emphysema, GERD, HFmrEF (EF 45 to 50 % in January 2025), metastatic adenocarcinoma of the left thyroid  (unclear if primary Thyroid  or metastatic to the thyroid )--with bony mets to the mesentery left posterior fourth rib as demonstrated by PET scan from 04/29/2024 and CTA chest from 06/05/2024 and 06/09/2024 admitted on 06/10/2024 with atypical chest pains and elevated troponins Cardiologist recommends holding apixaban  and transferring to [name] for possible LHC on 06/13/2024 after Eliquis  washout   -Assessment and Plan: 1)Atypical chest pains with Troponin Elevation Troponin 504 >>[id number] -Serial EKGs shows normal sinus rhythm without acute concerns - D-dimer greater than 20 similar to prior - CTA chest today without acute PE similar to CTA chest from 06/05/2024, - Repeat echo on 06/09/2024 with EF of 50 to 55%, global left ventricular hypokinesis similar to prior, grade 1 diastolic dysfunction, mild LVH, no AS, mild to moderate AR(AI), mild MR, No MS  --- Continue atorvastatin , Toprol -XL  - Patient with ongoing chest pains and troponin slightly uptrending so  Cardiologist recommends holding apixaban  and transferring to [name] for possible LHC on 06/13/2024 after Eliquis  washout   2)Metastatic Adenocarcinoma of the Left thyroid  (unclear if primary Thyroid  or metastatic to the thyroid )--with bony mets to the mesentery left posterior fourth rib as demonstrated by PET scan from 04/29/2024 and CTA chest from 06/05/2024 and 06/09/2024 -Patient was recently started on radiation therapy  few days ago---further radiation therapy treatment at [facility] will have to be rescheduled due to possible LHC on 06/13/2024 -He is scheduled for thyroid  surgery on 06/27/2024 with Dr. Eletha -- Awaiting placement of Port-A-Cath to initiate palliative chemotherapy -- Continue opiates -  palliative care consult for goals of care and pain management as requested  - Radiation therapy and chemotherapy will also help with pain management   3)Social/Ethics--- plan of care, advanced directives and CODE STATUS discussed with patient himself as well as patient's 3 daughters Natalie (HCPOA), Arland armin Query at bedside Discussion: We discussed goals of care for [employer]. .   -- Plan of care, goals of care and advance directive discussed -Patient and family request DNR DNI status without further limitations to treatment --Patient wants to continue to treat the treatable -He wants to continue with radiation therapy -He is hoping to get a Port-A-Cath inserted so he can get chemotherapy -He is hoping to get thyroid  surgery on 06/27/2024 after cardiology clearance -Again No limitations to treatment , but he request DNR/DNI status   4)DM2-A1c 6.0 reflecting excellent diabetic control PTA -Use Novolog /Humalog Sliding scale insulin  with Accu-Cheks/Fingersticks as ordered    5)GERD--stable, continue Protonix    6)HFimpEF/chronic diastolic dysfunction CHF--Repeat echo on 06/09/2024 with EF of 50 to 55% (EF 4045 to 50% on 12/16/2023), global left ventricular hypokinesis similar to prior, grade 1 diastolic dysfunction- -no acute CHF exacerbation at this time - Hold Entresto  due to contrast exposure - Continue Toprol -XL   7)COPD/emphysema--no acute exacerbation at this time - Continue bronchodilators -CTA chest  on 06/09/2024 showed Multiple pulmonary nodules, ,Pulmonary fibrosis and emphysema noted, -Left lower lobe  subpleural consolidation/mass versus round atelectasis stable to prior, however  gradually enlarging to September 05, 2023. Malignancy cannot be excluded.   8)PAFib--continue amiodarone  and Toprol -XL for rhythm and rate control - Continue Eliquis  for stroke secondary prophylaxis   9)H/o Pior CVA--patient has residual right-sided hemiparesis - Continue Eliquis  as above in #8 for secondary stroke prophylaxis - Continue atorvastatin  for stroke reduction   10)CKD stage - 3A  Creatinine is down to 1.28  from 1.61 on 06/07/2024 --Monitor renal function closely with contrast exposure on 06/09/2024 - renally adjust medications, avoid nephrotoxic agents / dehydration  / hypotension  Status is: Inpatient   Disposition: The patient is from: Home              Anticipated d/c is to: Home              Anticipated d/c date is: > 3 days              Patient currently is not medically stable to d/c. Barriers: Not Clinically Stable-   Code Status :  -  Code Status: Limited: Do not attempt resuscitation (DNR) -DNR-LIMITED -Do Not Intubate/DNI    Family Communication:   Discussed with with his daughters at bedside  DVT Prophylaxis  :   - SCDs   heparin  injection 5,000 Units Start: 06/11/24 0600 SCDs Start: 06/09/24 0907 Place TED hose Start: 06/09/24 0907   Lab Results  Component Value Date   PLT 177 06/09/2024    Inpatient Medications  Scheduled Meds:  amiodarone   200 mg Oral Daily   atorvastatin   40 mg Oral Daily   budesonide -glycopyrrolate -formoterol   2 puff Inhalation BID   cholecalciferol   1,000 Units Oral Daily   dorzolamide -timolol   1 drop Both Eyes Daily   [START ON 06/11/2024] heparin  injection (subcutaneous)  5,000 Units Subcutaneous Q8H   insulin  aspart  0-5 Units Subcutaneous QHS   insulin  aspart  0-6 Units Subcutaneous TID WC   metoprolol  succinate  12.5 mg Oral Daily   oxyCODONE   10 mg Oral Q12H   pantoprazole   40 mg Oral Daily   predniSONE   50 mg Oral Q breakfast   rOPINIRole   1 mg Oral TID   [START ON 06/11/2024] sacubitril -valsartan   1 tablet Oral BID    sodium chloride  flush  3 mL Intravenous Q12H   sodium chloride  flush  3 mL Intravenous Q12H   Continuous Infusions: PRN Meds:.acetaminophen  **OR** acetaminophen , bisacodyl , ondansetron  **OR** ondansetron  (ZOFRAN ) IV, mouth rinse, oxyCODONE , polyethylene glycol, sodium chloride  flush, traZODone    Anti-infectives (From admission, onward)    None         Subjective: Leonor Louder today has no fevers, no emesis,   - On and off chest pains overnight and this morning -Troponin trending up slightly - Daughters Joen and [place] at bedside, questions answered  Cardiologist recommends holding apixaban  and transferring to [name] for possible LHC on 06/13/2024 after Eliquis  washout    Objective: Vitals:   06/10/24 0424 06/10/24 0800 06/10/24 1355 06/10/24 2012  BP: (!) 144/50 (!) 129/49 (!) 108/43 (!) 141/55  Pulse:   (!) 58 (!) 55  Resp:    16  Temp: 98.3 F (36.8 C)  98 F (36.7 C) 98.5 F (36.9 C)  TempSrc: Oral  Oral Oral  SpO2: 92%  93% 93%  Weight:    87.1 kg  Height:        Intake/Output Summary (Last 24 hours) at 06/10/2024 2036 Last data filed at 06/10/2024 1300 Gross per 24 hour  Intake 928.23 ml  Output --  Net 928.23 ml   Filed Weights   06/09/24 0550 06/10/24 2012  Weight: 87.1 kg 87.1 kg    Physical Exam  Gen:- Awake Alert, no acute distress HEENT:- [place].AT, No sclera icterus Neck-Supple Neck,No JVD,.  Lungs-  CTAB , fair symmetrical air movement CV- S1, S2 normal, regular  Abd-  +ve B.Sounds, Abd Soft, No tenderness,    Extremity/Skin:- No  edema, pedal pulses present  Psych-affect is appropriate, oriented x3 Neuro-generalized weakness, residual right-sided hemiparesis, No additional/New focal deficits, no tremors MSK--reproducible left-sided chest wall and left-sided upper back area tenderness on palpation  Data Reviewed: I have personally reviewed following labs and imaging studies  CBC: Recent Labs  Lab 06/05/24 1305 06/07/24 1135  06/09/24 0641  WBC 4.8 6.7 7.0  NEUTROABS  --  5.4  --   HGB 12.3* 11.8* 12.2*  HCT 38.8* 36.4* 38.8*  MCV 95.1 94.5 96.8  PLT 236 207 177   Basic Metabolic Panel: Recent Labs  Lab 06/05/24 1525 06/07/24 1135 06/09/24 0641 06/10/24 0426  NA 139 139 139 139  K 4.9 4.2 3.9 4.5  CL 100 103 105 107  CO2 28 30 25 22   GLUCOSE 100* 106* 96 100*  BUN 24* 26* 20 22  CREATININE 1.82* 1.62* 1.31* 1.28*  CALCIUM  9.7 9.4 9.5 9.3  PHOS  --   --   --  3.4   GFR: Estimated Creatinine Clearance: 48 mL/min (A) (by C-G formula based on SCr of 1.28 mg/dL (H)). Liver Function Tests: Recent Labs  Lab 06/05/24 1525 06/07/24 1135 06/09/24 0641 06/10/24 0426  AST 29 24 26   --   ALT 38 26 22  --   ALKPHOS 127* 206* 262*  --   BILITOT 0.9 0.5 1.1  --   PROT 7.4 6.9 7.1  --   ALBUMIN 3.7 3.7 3.5 3.0*   HbA1C: Recent Labs    06/09/24 0643  HGBA1C 6.0*   Radiology Studies: ECHOCARDIOGRAM COMPLETE Result Date: 06/09/2024    ECHOCARDIOGRAM REPORT   Patient Name:   Semaj Coburn. Date of Exam: 06/09/2024 Medical Rec #:  [id number]             Height:       72.0 in Accession #:    [id number]            Weight:       192.0 lb Date of Birth:  [birth year]             BSA:          2.094 m Patient Age:    83 years              BP:           149/62 mmHg Patient Gender: M                     HR:           68 bpm. Exam Location:  [facility] Procedure: 2D Echo, Cardiac Doppler and Color Doppler (Both Spectral and Color            Flow Doppler were utilized during procedure). Indications:    Chest Pain R07.9  History:        Patient has prior history of Echocardiogram examinations, most                 recent 12/16/2023. CHF, Stroke, Arrythmias:Atrial Fibrillation;  Risk Factors:Hypertension and Dyslipidemia.  Sonographer:    Aida Pizza RCS Referring Phys: [phone] [facility]  Sonographer Comments: Suboptimal parasternal window. IMPRESSIONS  1. Global hypokinesis slightly worse in  inferior wall . Left ventricular ejection fraction, by estimation, is 50 to 55%. The left ventricle has low normal function. The left ventricle demonstrates global hypokinesis. The left ventricular internal cavity  size was mildly dilated. There is mild left ventricular hypertrophy. Left ventricular diastolic parameters are consistent with Grade I diastolic dysfunction (impaired relaxation).  2. Right ventricular systolic function is normal. The right ventricular size is normal.  3. The mitral valve is abnormal. Mild mitral valve regurgitation. No evidence of mitral stenosis.  4. The aortic valve was not well visualized. There is moderate calcification of the aortic valve. There is moderate thickening of the aortic valve. Aortic valve regurgitation is mild to moderate. Aortic valve sclerosis/calcification is present, without any evidence of aortic stenosis.  5. The inferior vena cava is normal in size with greater than 50% respiratory variability, suggesting right atrial pressure of 3 mmHg. FINDINGS  Left Ventricle: Global hypokinesis slightly worse in inferior wall. Left ventricular ejection fraction, by estimation, is 50 to 55%. The left ventricle has low normal function. The left ventricle demonstrates global hypokinesis. Strain was performed and  the global longitudinal strain is indeterminate. The left ventricular internal cavity size was mildly dilated. There is mild left ventricular hypertrophy. Left ventricular diastolic parameters are consistent with Grade I diastolic dysfunction (impaired relaxation). Right Ventricle: The right ventricular size is normal. No increase in right ventricular wall thickness. Right ventricular systolic function is normal. Left Atrium: Left atrial size was normal in size. Right Atrium: Right atrial size was normal in size. Pericardium: There is no evidence of pericardial effusion. Mitral Valve: The mitral valve is abnormal. There is mild thickening of the mitral valve leaflet(s).  There is mild calcification of the mitral valve leaflet(s). Mild mitral valve regurgitation. No evidence of mitral valve stenosis. Tricuspid Valve: The tricuspid valve is normal in structure. Tricuspid valve regurgitation is not demonstrated. No evidence of tricuspid stenosis. Aortic Valve: The aortic valve was not well visualized. There is moderate calcification of the aortic valve. There is moderate thickening of the aortic valve. Aortic valve regurgitation is mild to moderate. Aortic valve sclerosis/calcification is present, without any evidence of aortic stenosis. Aortic valve mean gradient measures 9.0 mmHg. Aortic valve peak gradient measures 16.2 mmHg. Aortic valve area, by VTI measures 2.26 cm. Pulmonic Valve: The pulmonic valve was normal in structure. Pulmonic valve regurgitation is not visualized. No evidence of pulmonic stenosis. Aorta: The aortic root is normal in size and structure. Venous: The inferior vena cava is normal in size with greater than 50% respiratory variability, suggesting right atrial pressure of 3 mmHg. IAS/Shunts: No atrial level shunt detected by color flow Doppler. Additional Comments: 3D was performed not requiring image post processing on an independent workstation and was indeterminate.  LEFT VENTRICLE PLAX 2D LVIDd:         5.70 cm   Diastology LVIDs:         4.20 cm   LV e' medial:    8.92 cm/s LV PW:         1.00 cm   LV E/e' medial:  5.8 LV IVS:        1.20 cm   LV e' lateral:   13.20 cm/s LVOT diam:     2.30 cm   LV E/e' lateral: 3.9 LV SV:  104 LV SV Index:   50 LVOT Area:     4.15 cm  RIGHT VENTRICLE RV S prime:     17.30 cm/s TAPSE (M-mode): 3.0 cm LEFT ATRIUM             Index        RIGHT ATRIUM           Index LA diam:        3.60 cm 1.72 cm/m   RA Area:     16.90 cm LA Vol (A2C):   80.4 ml 38.40 ml/m  RA Volume:   39.30 ml  18.77 ml/m LA Vol (A4C):   46.5 ml 22.21 ml/m LA Biplane Vol: 64.2 ml 30.66 ml/m  AORTIC VALVE AV Area (Vmax):    2.48 cm AV Area  (Vmean):   2.21 cm AV Area (VTI):     2.26 cm AV Vmax:           201.00 cm/s AV Vmean:          142.000 cm/s AV VTI:            0.462 m AV Peak Grad:      16.2 mmHg AV Mean Grad:      9.0 mmHg LVOT Vmax:         120.00 cm/s LVOT Vmean:        75.400 cm/s LVOT VTI:          0.252 m LVOT/AV VTI ratio: 0.54  AORTA Ao Root diam: 4.00 cm MITRAL VALVE MV Area (PHT): 1.71 cm    SHUNTS MV Decel Time: 444 msec    Systemic VTI:  0.25 m MV E velocity: 51.50 cm/s  Systemic Diam: 2.30 cm MV A velocity: 84.20 cm/s MV E/A ratio:  0.61 Maude Emmer MD Electronically signed by Maude Emmer MD Signature Date/Time: 06/09/2024/3:19:25 PM    Final    CT Angio Chest Pulmonary Embolism (PE) W or WO Contrast Result Date: 06/09/2024 CLINICAL DATA:  Pulmonary embolism suspected, high probability history of prostate cancer EXAM: CT ANGIOGRAPHY CHEST WITH CONTRAST TECHNIQUE: Multidetector CT imaging of the chest was performed using the standard protocol during bolus administration of intravenous contrast. Multiplanar CT image reconstructions and MIPs were obtained to evaluate the vascular anatomy. RADIATION DOSE REDUCTION: This exam was performed according to the departmental dose-optimization program which includes automated exposure control, adjustment of the mA and/or kV according to patient size and/or use of iterative reconstruction technique. CONTRAST:  75mL OMNIPAQUE  IOHEXOL  350 MG/ML SOLN COMPARISON:  Multiple priors dated back to September 05, 2023. FINDINGS: Cardiovascular: The heart size is normal. Atherosclerotic calcifications of coronary arteries. No pericardial fluid. Mediastinum/Nodes: Right thyroid  lobe nodule measuring 3.6 cm. No enlarged mediastinal, hilar, or axillary lymph nodes. Trachea, and esophagus demonstrate no significant findings. Lungs/Pleura: Severe upper lobe predominant centrilobular emphysematous changes. Bilateral lower lobe fibrotic changes with associated architectural distortion, the paraseptal  emphysematous changes, bronchiectasis and bronchiolectasis. Bronchial and bronchiolar wall thickening. Left lower lobe subpleural consolidation/mass measuring 3.6 x 2.2 cm, stable to prior however gradually enlarging to September 05, 2023 which measured 1 x 1.4 cm. Multiple pulmonary nodules are stable to prior as follows: Right upper lobe ground-glass nodule in subpleural measuring 4 mm (4/66). Multiple perifissural nodules along the right minor fissure (4/73). These nodules are stable to prior. No new nodule.  No pleural effusion. Upper Abdomen: Multiple simple cortical cysts of the left kidney. Small hiatal hernia. Thickening of bilateral multiple subcutaneous adrenal gland query small nodules measuring  1 cm. Musculoskeletal: Lytic expansile osseous metastasis involving the posterior left fourth rib, stable to prior. Multiple soft tissue nodules in bilateral anterior chest wall for example image 4/100, 54. Additional small subcentimeter nodule in left breast region (4/60. Mild gynecomastia. Review of the MIP images confirms the above findings. IMPRESSION: No evidence of pulmonary embolus. Multiple pulmonary nodules, subcutaneous soft tissue nodules and left posterior fourth rib lytic expansile lesion, stable to prior. Follow-up according to oncologic protocols. Stable right thyroid  nodule. 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 Left lower lobe subpleural consolidation/mass versus round atelectasis stable to prior, however gradually enlarging to September 05, 2023. Malignancy cannot be excluded. Recommend PET-CT for further assessment. Electronically Signed   By: Megan  Zare M.D.   On: 06/09/2024 12:51   DG Chest Port 1 View Result Date: 06/09/2024 CLINICAL DATA:  Chest pains radiating posteriorly. EXAM: PORTABLE CHEST 1 VIEW COMPARISON:  CTA chest 06/05/2024 FINDINGS: Emphysematous and chronic changes of the lungs. Focal left lower lobe opacity on the prior study is  not seen today. Heart size and vascular pattern are normal. Mediastinum is stable with aortic tortuosity and atherosclerosis. The bones are demineralized. IMPRESSION: 1. Emphysematous and chronic changes of the lungs. No acute chest findings. 2. Aortic atherosclerosis. Electronically Signed   By: Francis Quam M.D.   On: 06/09/2024 07:08   Scheduled Meds:  amiodarone   200 mg Oral Daily   atorvastatin   40 mg Oral Daily   budesonide -glycopyrrolate -formoterol   2 puff Inhalation BID   cholecalciferol   1,000 Units Oral Daily   dorzolamide -timolol   1 drop Both Eyes Daily   [START ON 06/11/2024] heparin  injection (subcutaneous)  5,000 Units Subcutaneous Q8H   insulin  aspart  0-5 Units Subcutaneous QHS   insulin  aspart  0-6 Units Subcutaneous TID WC   metoprolol  succinate  12.5 mg Oral Daily   oxyCODONE   10 mg Oral Q12H   pantoprazole   40 mg Oral Daily   predniSONE   50 mg Oral Q breakfast   rOPINIRole   1 mg Oral TID   [START ON 06/11/2024] sacubitril -valsartan   1 tablet Oral BID   sodium chloride  flush  3 mL Intravenous Q12H   sodium chloride  flush  3 mL Intravenous Q12H   Continuous Infusions:   LOS: 0 days    Rendall Carwin M.D on 06/10/2024 at 8:36 PM  Go to [email or web address] - for contact info  [facility] - Office  [phone]  If 7PM-7AM, please contact night-coverage [email or web address] 06/10/2024, 8:36 PM

## 2024-06-10 NOTE — Progress Notes (Signed)
   06/10/24 0606  Provider Notification  Provider Name/Title Dr.Dorrell  Date Provider Notified 06/10/24  Time Provider Notified 0606  Method of Notification Page  Notification Reason Critical Result  Test performed and critical result Troponin 675  Date Critical Result Received 06/10/24  Time Critical Result Received 0604  Provider response No new orders  Date of Provider Response 06/10/24  Time of Provider Response [phone]

## 2024-06-11 DIAGNOSIS — I259 Chronic ischemic heart disease, unspecified: Secondary | ICD-10-CM | POA: Diagnosis not present

## 2024-06-11 DIAGNOSIS — R0789 Other chest pain: Secondary | ICD-10-CM | POA: Diagnosis not present

## 2024-06-11 DIAGNOSIS — Z8673 Personal history of transient ischemic attack (TIA), and cerebral infarction without residual deficits: Secondary | ICD-10-CM

## 2024-06-11 DIAGNOSIS — J432 Centrilobular emphysema: Secondary | ICD-10-CM

## 2024-06-11 DIAGNOSIS — I5032 Chronic diastolic (congestive) heart failure: Secondary | ICD-10-CM | POA: Diagnosis not present

## 2024-06-11 DIAGNOSIS — R7989 Other specified abnormal findings of blood chemistry: Secondary | ICD-10-CM | POA: Diagnosis not present

## 2024-06-11 DIAGNOSIS — N1831 Chronic kidney disease, stage 3a: Secondary | ICD-10-CM

## 2024-06-11 DIAGNOSIS — I480 Paroxysmal atrial fibrillation: Secondary | ICD-10-CM | POA: Diagnosis not present

## 2024-06-11 DIAGNOSIS — E785 Hyperlipidemia, unspecified: Secondary | ICD-10-CM

## 2024-06-11 DIAGNOSIS — E1169 Type 2 diabetes mellitus with other specified complication: Secondary | ICD-10-CM

## 2024-06-11 LAB — CBC
HCT: 35.3 % — ABNORMAL LOW (ref 39.0–52.0)
Hemoglobin: 11.5 g/dL — ABNORMAL LOW (ref 13.0–17.0)
MCH: 30.5 pg (ref 26.0–34.0)
MCHC: 32.6 g/dL (ref 30.0–36.0)
MCV: 93.6 fL (ref 80.0–100.0)
Platelets: 165 K/uL (ref 150–400)
RBC: 3.77 MIL/uL — ABNORMAL LOW (ref 4.22–5.81)
RDW: 15.4 % (ref 11.5–15.5)
WBC: 8.2 K/uL (ref 4.0–10.5)
nRBC: 0.2 % (ref 0.0–0.2)

## 2024-06-11 LAB — APTT: aPTT: 56 s — ABNORMAL HIGH (ref 24–36)

## 2024-06-11 LAB — GLUCOSE, CAPILLARY
Glucose-Capillary: 81 mg/dL (ref 70–99)
Glucose-Capillary: 98 mg/dL (ref 70–99)
Glucose-Capillary: 115 mg/dL — ABNORMAL HIGH (ref 70–99)
Glucose-Capillary: 110 mg/dL — ABNORMAL HIGH (ref 70–99)

## 2024-06-11 LAB — HEPARIN LEVEL (UNFRACTIONATED): Heparin Unfractionated: >1.1 [IU]/mL — ABNORMAL HIGH (ref 0.30–0.70)

## 2024-06-11 MED ORDER — HEPARIN (PORCINE) 25000 UT/250ML-% IV SOLN
1300.0000 [IU]/h | INTRAVENOUS | Status: DC
  Administered 2024-06-11: 1150 [IU]/h via INTRAVENOUS
  Administered 2024-06-12 (×2): 1300 [IU]/h via INTRAVENOUS
  Filled 2024-06-11 (×3): qty 250

## 2024-06-11 NOTE — Progress Notes (Signed)
  Progress Note   Patient: Joshua Compton. FMW:[id number] DOB: 1941/10/14 DOA: 06/09/2024     1 DOS: the patient was seen and examined on 06/11/2024   Brief hospital course: 83 y.o. male with medical history significant for history of prostate cancer, HTN, prior strokes, HLD, COPD/emphysema, GERD, HFmrEF (EF 45 to 50 % in January 2025), metastatic adenocarcinoma of the left thyroid  (unclear if primary Thyroid  or metastatic to the thyroid )--with bony mets to the mesentery left posterior fourth rib as demonstrated by PET scan from 04/29/2024 and CTA chest from 06/05/2024 and 06/09/2024 admitted on 06/10/2024 with atypical chest pains and elevated troponins Cardiologist recommends holding apixaban  and transferring to [name] for possible LHC on 06/13/2024 after Eliquis  washout  07/26 transfer to [name]. Plan for cardiac catheterization on 07/28     Assessment and Plan: * Chest pain Possible NSTEMI Echocardiogram with worse inferior wall hypokinesis.   Plan to continue anticoagulation with IV heparin .  Continue atorvastatin  and metoprolol .  Plan for cardiac catheterization on this admission.   PAF (paroxysmal atrial fibrillation) (HCC) Continue amiodarone  for rate and rhythm control Anticoagulation with IV heparin  in preparation for cardiac catheterization.  Continue telemetry monitoring   Chronic kidney disease, stage 3a (HCC) Renal function with serum cr at 1.28 with K at 4,5 and serum bicarbonate at 22  Na 139  Continue close follow up renal function and electrolytes.   Chronic diastolic CHF (congestive heart failure) (HCC) No signs of acute decompensation  Continue with metoprolol  succinate and entresto .   Centrilobular emphysema (HCC) No signs of acute exacerbation   Metastatic adenocarcinoma [facility]) Patient on radiation therapy, Awaiting placement of Port A cath for palliative chemotherapy.   History of prostate cancer.   History of CVA (cerebrovascular accident) Continue  blood pressure monitoring Continue statin Currently on IV heparin  for anticoagulation   Gastroesophageal reflux disease with esophagitis without hemorrhage Continue pantoprazole    Type 2 diabetes mellitus with hyperlipidemia (HCC) Continue insulin  sliding scale for glucose cover and monitoring  Continue statin therapy.         Subjective: Patient with no chest pain or dyspnea, no PND or orthopnea.   Physical Exam: Vitals:   06/11/24 0014 06/11/24 0449 06/11/24 0814 06/11/24 1621  BP: (!) 138/49 (!) 149/48 (!) 146/49   Pulse: (!) 47 (!) 55 (!) 57   Resp: 18 18 16 14   Temp: 98.4 F (36.9 C) 98.5 F (36.9 C) 97.7 F (36.5 C) 97.7 F (36.5 C)  TempSrc: Oral Oral Oral Oral  SpO2: 95% 96% 96%   Weight:      Height:       Neurology awake and alert ENT with mild pallor Cardiovascular with S1 and S2 present and regular with no gallops, rubs or murmurs Respiratory with no rales or wheezing, no rhonchi  Abdomen with no distention  No lower extremity edema  Data Reviewed:    Family Communication: no family at the bedside   Disposition: Status is: Inpatient Remains inpatient appropriate because: cardiac catheterization   Planned Discharge Destination: Home     Author: Elidia Toribio Furnace, MD 06/11/2024 6:01 PM  For on call review [email or web address].

## 2024-06-11 NOTE — Plan of Care (Signed)

## 2024-06-11 NOTE — Plan of Care (Signed)

## 2024-06-11 NOTE — Assessment & Plan Note (Signed)
 Possible NSTEMI Echocardiogram with worse inferior wall hypokinesis.   Plan to continue anticoagulation with IV heparin .  Continue atorvastatin  and metoprolol .  Plan for cardiac catheterization on this admission.

## 2024-06-11 NOTE — Progress Notes (Signed)
 PHARMACY - ANTICOAGULATION CONSULT NOTE  Pharmacy Consult for heparin  IV Indication: chest pain/ACS  Allergies  Allergen Reactions   Iodinated Contrast Media Anaphylaxis    Tongue swelled up when he had MRI in [facility]  Intubated for airway protection after oral and throat swelling   Pravastatin  Other (See Comments)    Muscle aches    Patient Measurements: Height: 6' (182.9 cm) Weight: 87.1 kg (192 lb 0.3 oz) IBW/kg (Calculated) : 77.6 HEPARIN  DW (KG): 87.1  Vital Signs: Temp: 97.7 F (36.5 C) (07/26 0814) Temp Source: Oral (07/26 0814) BP: 146/49 (07/26 0814) Pulse Rate: 57 (07/26 0814)  Labs: Recent Labs    06/09/24 0641 06/09/24 0859 06/09/24 1417 06/10/24 0426  HGB 12.2*  --   --   --   HCT 38.8*  --   --   --   PLT 177  --   --   --   CREATININE 1.31*  --   --  1.28*  TROPONINIHS 504* 541* 606* 675*    Estimated Creatinine Clearance: 48 mL/min (A) (by C-G formula based on SCr of 1.28 mg/dL (H)).   Medical History: Past Medical History:  Diagnosis Date   Acute heart failure with preserved ejection fraction (HFpEF) (HCC) 09/05/2023   Acute HFrEF (heart failure with reduced ejection fraction) (HCC) 09/08/2023   Acute respiratory failure with hypoxia (HCC) 09/05/2023   Allergy    Arthritis    Cancer (HCC)    prostate ca   seed implants   CHF (congestive heart failure) (HCC)    GERD (gastroesophageal reflux disease)    Glaucoma    Hyperlipidemia    Hypertension    dr cyrena     [place]   fm   Stroke [facility]) 07/2023    Medications:  Scheduled:   amiodarone   200 mg Oral Daily   atorvastatin   40 mg Oral Daily   budesonide -glycopyrrolate -formoterol   2 puff Inhalation BID   cholecalciferol   1,000 Units Oral Daily   dorzolamide -timolol   1 drop Both Eyes Daily   heparin  injection (subcutaneous)  5,000 Units Subcutaneous Q8H   insulin  aspart  0-5 Units Subcutaneous QHS   insulin  aspart  0-6 Units Subcutaneous TID WC   metoprolol  succinate  12.5 mg  Oral Daily   oxyCODONE   10 mg Oral Q12H   pantoprazole   40 mg Oral Daily   predniSONE   50 mg Oral Q breakfast   rOPINIRole   1 mg Oral TID   sacubitril -valsartan   1 tablet Oral BID   sodium chloride  flush  3 mL Intravenous Q12H   sodium chloride  flush  3 mL Intravenous Q12H    Assessment: 83 yo M presenting with chest pain. PTA Eliquis  (reduced dose; LD 7/25 0829). Pharmacy consulted for heparin  management.   No bolus in setting of recent DOAC administration and 5000 units subcutaneous heparin  this morning. Plan for [facility] 7/28.  Goal of Therapy:  Goal aPTT 66-102 Heparin  level 0.3-0.7 units/ml Monitor platelets by anticoagulation protocol: Yes   Plan:  Start heparin  infusion at 1150 units/hr IV Check anti-Xa level in 8 hours and daily while on heparin  Continue to monitor H&H and platelets   Joshua Compton, PharmD PGY1 Clinical Pharmacist [name]  06/11/2024 9:23 AM

## 2024-06-11 NOTE — Assessment & Plan Note (Deleted)
 Follow up as outpatient.

## 2024-06-11 NOTE — Assessment & Plan Note (Signed)
 Stable renal function with serum cr at 1,24 with K at 3,9 and serum bicarbonate at 20  Na 135  Continue close follow up renal function and electrolytes.

## 2024-06-11 NOTE — Assessment & Plan Note (Addendum)
 Patient was placed on insulin  sliding scale for glucose cover and monitoring.  Glucose remained well controlled.  Fasting discharge glucose is 103 mg/dl.  Continue statin therapy.

## 2024-06-11 NOTE — Hospital Course (Signed)
 Joshua Compton was admitted to the hospital with the working diagnosis of chest pain.   83 y.o. 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 Recently started on radiation therapy.  Reported chest pain with radiation to the back.  On his initial physical examination his blood pressure was 166/59, HR 64, RR 18 and 02 saturation 92% Lungs with no wheezing or rales, heart with S1 and S2 present and regular, abdomen with no distention and no lower extremity edema. Residual right sided hemiparesis. Noted reproducible pain to chest wall palpation.   Na 139, K 3,9 Cl 105 bicarbonate 25 glucose 96 bun 20 cr 1,31  High sensitive troponin 504, 531 and 606  Wbc 7,0 hgb 12,2 plt 177  D dimer > 20   Chest radiograph with hyperinflation and increased lung markings at the bases. No infiltrates or effusions.  CT chest with no pulmonary embolism. Multiple pulmonary nodules, subcutaneous soft tissue nodules and left posterior fourth rib lytic expansile lesion, stable to prior.  Stable right thyroid  nodule.  Signs of pulmonary fibrosis and emphysema.  Left lower lobe subpleural consolidation/ mass versus round atelectasis stable to prior, but gradually enlarging from 2024.   EKG 62 bpm, normal axis, normal intervals, qtc 444, sinus rhythm with no significant ST segment or T wave changes.   Transferring to [facility] for possible LHC on 06/13/2024 after Eliquis  washout.  Patient was placed on IV heparin .   07/26 transfer to [facility]. Plan for cardiac catheterization on 07/28  07/28 plan for catheterization today.

## 2024-06-11 NOTE — Progress Notes (Signed)
 Progress Note  Patient Name: Joshua Compton. Date of Encounter: 06/11/2024  [facility] Cardiologist: Alvan Carrier, MD    Subjective   Transferred to [facility] yesterday for chest pain and rising cardiac enzymes. On eliquis  which has been held and transitioned to IV heparin .  Troponins were [phone]. Plan is for [facility] on Monday.  Overnight denies any chest pain - up and eating today. Has not been started on IV Heparin , however, Eliquis  has been held.  Inpatient Medications    Scheduled Meds:  amiodarone   200 mg Oral Daily   atorvastatin   40 mg Oral Daily   budesonide -glycopyrrolate -formoterol   2 puff Inhalation BID   cholecalciferol   1,000 Units Oral Daily   dorzolamide -timolol   1 drop Both Eyes Daily   heparin  injection (subcutaneous)  5,000 Units Subcutaneous Q8H   insulin  aspart  0-5 Units Subcutaneous QHS   insulin  aspart  0-6 Units Subcutaneous TID WC   metoprolol  succinate  12.5 mg Oral Daily   oxyCODONE   10 mg Oral Q12H   pantoprazole   40 mg Oral Daily   predniSONE   50 mg Oral Q breakfast   rOPINIRole   1 mg Oral TID   sacubitril -valsartan   1 tablet Oral BID   sodium chloride  flush  3 mL Intravenous Q12H   sodium chloride  flush  3 mL Intravenous Q12H   Continuous Infusions:  PRN Meds: acetaminophen  **OR** acetaminophen , bisacodyl , ondansetron  **OR** ondansetron  (ZOFRAN ) IV, mouth rinse, oxyCODONE , polyethylene glycol, sodium chloride  flush, traZODone    Vital Signs    Vitals:   06/10/24 2012 06/11/24 0014 06/11/24 0449 06/11/24 0814  BP: (!) 141/55 (!) 138/49 (!) 149/48 (!) 146/49  Pulse: (!) 55 (!) 47 (!) 55 (!) 57  Resp: 16 18 18 16   Temp: 98.5 F (36.9 C) 98.4 F (36.9 C) 98.5 F (36.9 C) 97.7 F (36.5 C)  TempSrc: Oral Oral Oral Oral  SpO2: 93% 95% 96% 96%  Weight: 87.1 kg     Height:        Intake/Output Summary (Last 24 hours) at 06/11/2024 0901 Last data filed at 06/11/2024 0100 Gross per 24 hour  Intake 240 ml  Output 400 ml  Net  -160 ml      06/10/2024    8:12 PM 06/09/2024    5:50 AM 06/07/2024   12:22 PM  Last 3 Weights  Weight (lbs) 192 lb 0.3 oz 192 lb 192 lb 7 oz  Weight (kg) 87.1 kg 87.091 kg 87.289 kg      Telemetry    Normal sinus rhythm- Personally Reviewed  ECG    Normal sinus rhythm with no ST changes- Personally Reviewed  Physical Exam   GEN: No acute distress.   Neck: No JVD Cardiac: RRR, no murmurs, rubs, or gallops.  Respiratory: Clear to auscultation bilaterally. GI: Soft, nontender, non-distended  MS: No edema; No deformity. Neuro:  Nonfocal  Psych: Normal affect   Labs    High Sensitivity Troponin:   Recent Labs  Lab 06/05/24 1524 06/09/24 0641 06/09/24 0859 06/09/24 1417 06/10/24 0426  TROPONINIHS 70* 504* 541* 606* 675*      Chemistry Recent Labs  Lab 06/05/24 1525 06/07/24 1135 06/09/24 0641 06/10/24 0426  NA 139 139 139 139  K 4.9 4.2 3.9 4.5  CL 100 103 105 107  CO2 28 30 25 22   GLUCOSE 100* 106* 96 100*  BUN 24* 26* 20 22  CREATININE 1.82* 1.62* 1.31* 1.28*  CALCIUM  9.7 9.4 9.5 9.3  PROT 7.4 6.9 7.1  --  ALBUMIN 3.7 3.7 3.5 3.0*  AST 29 24 26   --   ALT 38 26 22  --   ALKPHOS 127* 206* 262*  --   BILITOT 0.9 0.5 1.1  --   GFRNONAA 36* 42* 54* 56*  ANIONGAP 11 6 9 10      Hematology Recent Labs  Lab 06/05/24 1305 06/07/24 1135 06/09/24 0641  WBC 4.8 6.7 7.0  RBC 4.08* 3.85* 4.01*  HGB 12.3* 11.8* 12.2*  HCT 38.8* 36.4* 38.8*  MCV 95.1 94.5 96.8  MCH 30.1 30.6 30.4  MCHC 31.7 32.4 31.4  RDW 15.4 15.7* 15.8*  PLT 236 207 177    BNP Recent Labs  Lab 06/05/24 1320  BNP 79.5     DDimer  Recent Labs  Lab 06/05/24 1340 06/09/24 0859  DDIMER >20.00* >20.00*     CHA2DS2-VASc Score = 7   This indicates a 11.2% annual risk of stroke. The patient's score is based upon: CHF History: 1 HTN History: 1 Diabetes History: 0 Stroke History: 2 Vascular Disease History: 1 Age Score: 2 Gender Score: 0   Radiology     ECHOCARDIOGRAM COMPLETE Result Date: 06/09/2024    ECHOCARDIOGRAM REPORT   Patient Name:   Joshua Compton. Date of Exam: 06/09/2024 Medical Rec #:  [id number]             Height:       72.0 in Accession #:    [id number]            Weight:       192.0 lb Date of Birth:  December 02, 1940             BSA:          2.094 m Patient Age:    83 years              BP:           149/62 mmHg Patient Gender: M                     HR:           68 bpm. Exam Location:  [facility] Procedure: 2D Echo, Cardiac Doppler and Color Doppler (Both Spectral and Color            Flow Doppler were utilized during procedure). Indications:    Chest Pain R07.9  History:        Patient has prior history of Echocardiogram examinations, most                 recent 12/16/2023. CHF, Stroke, Arrythmias:Atrial Fibrillation;                 Risk Factors:Hypertension and Dyslipidemia.  Sonographer:    Aida Pizza RCS Referring Phys: [phone] [facility]  Sonographer Comments: Suboptimal parasternal window. IMPRESSIONS  1. Global hypokinesis slightly worse in inferior wall . Left ventricular ejection fraction, by estimation, is 50 to 55%. The left ventricle has low normal function. The left ventricle demonstrates global hypokinesis. The left ventricular internal cavity  size was mildly dilated. There is mild left ventricular hypertrophy. Left ventricular diastolic parameters are consistent with Grade I diastolic dysfunction (impaired relaxation).  2. Right ventricular systolic function is normal. The right ventricular size is normal.  3. The mitral valve is abnormal. Mild mitral valve regurgitation. No evidence of mitral stenosis.  4. The aortic valve was not well visualized. There is moderate calcification of the aortic valve. There is moderate thickening of  the aortic valve. Aortic valve regurgitation is mild to moderate. Aortic valve sclerosis/calcification is present, without any evidence of aortic stenosis.  5. The inferior vena cava is normal  in size with greater than 50% respiratory variability, suggesting right atrial pressure of 3 mmHg. FINDINGS  Left Ventricle: Global hypokinesis slightly worse in inferior wall. Left ventricular ejection fraction, by estimation, is 50 to 55%. The left ventricle has low normal function. The left ventricle demonstrates global hypokinesis. Strain was performed and  the global longitudinal strain is indeterminate. The left ventricular internal cavity size was mildly dilated. There is mild left ventricular hypertrophy. Left ventricular diastolic parameters are consistent with Grade I diastolic dysfunction (impaired relaxation). Right Ventricle: The right ventricular size is normal. No increase in right ventricular wall thickness. Right ventricular systolic function is normal. Left Atrium: Left atrial size was normal in size. Right Atrium: Right atrial size was normal in size. Pericardium: There is no evidence of pericardial effusion. Mitral Valve: The mitral valve is abnormal. There is mild thickening of the mitral valve leaflet(s). There is mild calcification of the mitral valve leaflet(s). Mild mitral valve regurgitation. No evidence of mitral valve stenosis. Tricuspid Valve: The tricuspid valve is normal in structure. Tricuspid valve regurgitation is not demonstrated. No evidence of tricuspid stenosis. Aortic Valve: The aortic valve was not well visualized. There is moderate calcification of the aortic valve. There is moderate thickening of the aortic valve. Aortic valve regurgitation is mild to moderate. Aortic valve sclerosis/calcification is present, without any evidence of aortic stenosis. Aortic valve mean gradient measures 9.0 mmHg. Aortic valve peak gradient measures 16.2 mmHg. Aortic valve area, by VTI measures 2.26 cm. Pulmonic Valve: The pulmonic valve was normal in structure. Pulmonic valve regurgitation is not visualized. No evidence of pulmonic stenosis. Aorta: The aortic root is normal in size and  structure. Venous: The inferior vena cava is normal in size with greater than 50% respiratory variability, suggesting right atrial pressure of 3 mmHg. IAS/Shunts: No atrial level shunt detected by color flow Doppler. Additional Comments: 3D was performed not requiring image post processing on an independent workstation and was indeterminate.  LEFT VENTRICLE PLAX 2D LVIDd:         5.70 cm   Diastology LVIDs:         4.20 cm   LV e' medial:    8.92 cm/s LV PW:         1.00 cm   LV E/e' medial:  5.8 LV IVS:        1.20 cm   LV e' lateral:   13.20 cm/s LVOT diam:     2.30 cm   LV E/e' lateral: 3.9 LV SV:         104 LV SV Index:   50 LVOT Area:     4.15 cm  RIGHT VENTRICLE RV S prime:     17.30 cm/s TAPSE (M-mode): 3.0 cm LEFT ATRIUM             Index        RIGHT ATRIUM           Index LA diam:        3.60 cm 1.72 cm/m   RA Area:     16.90 cm LA Vol (A2C):   80.4 ml 38.40 ml/m  RA Volume:   39.30 ml  18.77 ml/m LA Vol (A4C):   46.5 ml 22.21 ml/m LA Biplane Vol: 64.2 ml 30.66 ml/m  AORTIC VALVE AV Area (Vmax):  2.48 cm AV Area (Vmean):   2.21 cm AV Area (VTI):     2.26 cm AV Vmax:           201.00 cm/s AV Vmean:          142.000 cm/s AV VTI:            0.462 m AV Peak Grad:      16.2 mmHg AV Mean Grad:      9.0 mmHg LVOT Vmax:         120.00 cm/s LVOT Vmean:        75.400 cm/s LVOT VTI:          0.252 m LVOT/AV VTI ratio: 0.54  AORTA Ao Root diam: 4.00 cm MITRAL VALVE MV Area (PHT): 1.71 cm    SHUNTS MV Decel Time: 444 msec    Systemic VTI:  0.25 m MV E velocity: 51.50 cm/s  Systemic Diam: 2.30 cm MV A velocity: 84.20 cm/s MV E/A ratio:  0.61 Maude Emmer MD Electronically signed by Maude Emmer MD Signature Date/Time: 06/09/2024/3:19:25 PM    Final    CT Angio Chest Pulmonary Embolism (PE) W or WO Contrast Result Date: 06/09/2024 CLINICAL DATA:  Pulmonary embolism suspected, high probability history of prostate cancer EXAM: CT ANGIOGRAPHY CHEST WITH CONTRAST TECHNIQUE: Multidetector CT imaging of the  chest was performed using the standard protocol during bolus administration of intravenous contrast. Multiplanar CT image reconstructions and MIPs were obtained to evaluate the vascular anatomy. RADIATION DOSE REDUCTION: This exam was performed according to the departmental dose-optimization program which includes automated exposure control, adjustment of the mA and/or kV according to patient size and/or use of iterative reconstruction technique. CONTRAST:  75mL OMNIPAQUE  IOHEXOL  350 MG/ML SOLN COMPARISON:  Multiple priors dated back to September 05, 2023. FINDINGS: Cardiovascular: The heart size is normal. Atherosclerotic calcifications of coronary arteries. No pericardial fluid. Mediastinum/Nodes: Right thyroid  lobe nodule measuring 3.6 cm. No enlarged mediastinal, hilar, or axillary lymph nodes. Trachea, and esophagus demonstrate no significant findings. Lungs/Pleura: Severe upper lobe predominant centrilobular emphysematous changes. Bilateral lower lobe fibrotic changes with associated architectural distortion, the paraseptal emphysematous changes, bronchiectasis and bronchiolectasis. Bronchial and bronchiolar wall thickening. Left lower lobe subpleural consolidation/mass measuring 3.6 x 2.2 cm, stable to prior however gradually enlarging to September 05, 2023 which measured 1 x 1.4 cm. Multiple pulmonary nodules are stable to prior as follows: Right upper lobe ground-glass nodule in subpleural measuring 4 mm (4/66). Multiple perifissural nodules along the right minor fissure (4/73). These nodules are stable to prior. No new nodule.  No pleural effusion. Upper Abdomen: Multiple simple cortical cysts of the left kidney. Small hiatal hernia. Thickening of bilateral multiple subcutaneous adrenal gland query small nodules measuring 1 cm. Musculoskeletal: Lytic expansile osseous metastasis involving the posterior left fourth rib, stable to prior. Multiple soft tissue nodules in bilateral anterior chest wall for example  image 4/100, 54. Additional small subcentimeter nodule in left breast region (4/60. Mild gynecomastia. Review of the MIP images confirms the above findings. IMPRESSION: No evidence of pulmonary embolus. Multiple pulmonary nodules, subcutaneous soft tissue nodules and left posterior fourth rib lytic expansile lesion, stable to prior. Follow-up according to oncologic protocols. Stable right thyroid  nodule. 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 Left lower lobe subpleural consolidation/mass versus round atelectasis stable to prior, however gradually enlarging to September 05, 2023. Malignancy cannot be excluded. Recommend PET-CT for further assessment. Electronically Signed   By: Megan  Zare M.D.   On:  06/09/2024 12:51    Patient Profile     83 y.o. male with a history ofPAF, CVA, HFmrEF, HLD, HTN. He has been in NSR No history of CAD but no stress testing done TTE dopne 12/16/23 with EF 45-50% normal RV and AV sclerosis.  Admitted with chest pain and an elevated troponin at 504.  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  Assessment & Plan    Chest pain -He has no history of CAD but does have a history of mild LV dysfunction EF 45 to 50% by echo 12/07/2023 -His pain is atypical and seems to be more related to bony mets that he has been taking narcotics for -Troponin was elevated at 500 on admit with a flat trend up to 675 -Repeat 2D echo yesterday showed EF 50 to 55% with global HK worse in the inferior walls, mild LVH, G1 DD, normal RV and mild to moderate AI - This morning he is complaining of 8 out of 10 chest pain.  It is difficult for him to describe it but says it is not in his chest wall.  He does have some pain in his back but it is not what his typical back pain from his mets is like. - Although on 2D echo EF is  improved from 45 to 50% now to 50 to 55% but now mentions more prominent inferior hypokinesis. - Given slightly uptrending troponin and ongoing chest pain I think we need to define his coronary anatomy. - He does have a history of CKD but his serum creatinine is 1.23 today.  Therefore I think we should send him down to [facility] for a left and possible cardiac cath. Informed Consent   Shared Decision Making/Informed Consent The risks [stroke (1 in 1000), death (1 in 1000), kidney failure [usually temporary] (1 in 500), bleeding (1 in 200), allergic reaction [possibly serious] (1 in 200)], benefits (diagnostic support and management of coronary artery disease) and alternatives of a cardiac catheterization were discussed in detail with Joshua Compton and he is willing to proceed. -Plan for LHC tentatively on Monday -Will need to be off Eliquis  for 48 hours prior to left heart cath which we will plan for Monday -Would recommend course only cath given his history of CKD in the past -Hold Entresto  for now to protect kidneys for -Transition Eliquis  to IV heparin  drip per pharmacy -Start aspirin  81 mg daily -continue Toprol  XL 12.5 mg daily -Continue Crestor  40 mg daily  Paroxysmal atrial fibrillation -He is maintaining normal sinus rhythm -Continue Eliquis  2.5 mg twice daily dosed for age>80 and SCr>1.5 although his serum creatinine is normal today it has been as high as 2 earlier in the year and 1.6 when he came in -For now we will hold Eliquis  did not need for -Will consult pharmacy for IV heparin  drip  Chronic combined systolic/diastolic CHF - Last EF this admission 50 to 55% but had been 45 to 50% by echo 12/07/2023 - He does not appear volume overloaded on exam today - Continue Toprol  XL 12.5 mg daily - Will hold Entresto  due to borderline serum creatinine and need for cath on Monday - He is not on MRA or SGLT2 given history of AKI with serum creatinines as high as the 2's.  If renal  function remains stable going forward could  consider addition of these   For questions or updates, please contact [facility] Please consult [email or web address] for contact info under   Vinie KYM Maxcy, MD, [facility], FNLA, FACP  [place]  [facility]  Medical Director of the [facility]  [facility] Diplomate of the American Board of Clinical Lipidology Attending Cardiologist  Direct Dial: [phone]  Fax: [phone]  Website:  [email or web address]  Vinie JAYSON Maxcy, MD  06/11/2024, 9:01 AM

## 2024-06-11 NOTE — Assessment & Plan Note (Signed)
Continue amiodarone for rate and rhythm control Anticoagulation with apixaban.

## 2024-06-11 NOTE — Assessment & Plan Note (Signed)
 Patient on radiation therapy, Awaiting placement of Port A cath for palliative chemotherapy.   History of prostate cancer.

## 2024-06-11 NOTE — Progress Notes (Addendum)
 PHARMACY - ANTICOAGULATION CONSULT NOTE  Pharmacy Consult for heparin  IV Indication: chest pain/ACS  Allergies  Allergen Reactions   Iodinated Contrast Media Anaphylaxis    Tongue swelled up when he had MRI in [facility]  Intubated for airway protection after oral and throat swelling   Pravastatin  Other (See Comments)    Muscle aches    Patient Measurements: Height: 6' (182.9 cm) Weight: 87.1 kg (192 lb 0.3 oz) IBW/kg (Calculated) : 77.6 HEPARIN  DW (KG): 87.1  Vital Signs: Temp: 97.7 F (36.5 C) (07/26 1621) Temp Source: Oral (07/26 1621) BP: 146/49 (07/26 0814) Pulse Rate: 57 (07/26 0814)  Labs: Recent Labs    06/09/24 0641 06/09/24 0859 06/09/24 1417 06/10/24 0426 06/11/24 0955 06/11/24 1642  HGB 12.2*  --   --   --  11.5*  --   HCT 38.8*  --   --   --  35.3*  --   PLT 177  --   --   --  165  --   APTT  --   --   --   --   --  56*  HEPARINUNFRC  --   --   --   --   --  >1.10*  CREATININE 1.31*  --   --  1.28*  --   --   TROPONINIHS 504* 541* 606* 675*  --   --     Estimated Creatinine Clearance: 48 mL/min (A) (by C-G formula based on SCr of 1.28 mg/dL (H)).   Medical History: Past Medical History:  Diagnosis Date   Acute heart failure with preserved ejection fraction (HFpEF) (HCC) 09/05/2023   Acute HFrEF (heart failure with reduced ejection fraction) (HCC) 09/08/2023   Acute respiratory failure with hypoxia (HCC) 09/05/2023   Allergy    Arthritis    Cancer (HCC)    prostate ca   seed implants   CHF (congestive heart failure) (HCC)    GERD (gastroesophageal reflux disease)    Glaucoma    Hyperlipidemia    Hypertension    dr cyrena     [facility]   fm   Stroke [facility]) 07/2023    Medications:  Scheduled:   amiodarone   200 mg Oral Daily   atorvastatin   40 mg Oral Daily   budesonide -glycopyrrolate -formoterol   2 puff Inhalation BID   cholecalciferol   1,000 Units Oral Daily   dorzolamide -timolol   1 drop Both Eyes Daily   insulin  aspart  0-5 Units  Subcutaneous QHS   insulin  aspart  0-6 Units Subcutaneous TID WC   metoprolol  succinate  12.5 mg Oral Daily   oxyCODONE   10 mg Oral Q12H   pantoprazole   40 mg Oral Daily   predniSONE   50 mg Oral Q breakfast   rOPINIRole   1 mg Oral TID   sacubitril -valsartan   1 tablet Oral BID   sodium chloride  flush  3 mL Intravenous Q12H   sodium chloride  flush  3 mL Intravenous Q12H    Assessment: 83 yo M presenting with chest pain. PTA Eliquis  (reduced dose; LD 7/25 0829). Pharmacy consulted for heparin  management.   No bolus in setting of recent DOAC administration and 5000 units subcutaneous heparin  this morning. Plan for [facility] 7/28.  7/26 PM: HL >1.10, aPTT subtherapeutic at 56 sec at infusion rate of 1150 units/hr. Hgb and plts stable. Will forego bolus due to recent DOAC administration and aPTT close to goal. Do not want to overshoot. Will increase infusion rate.  Goal of Therapy:  Goal aPTT 66-102 Heparin  level 0.3-0.7 units/ml Monitor  platelets by anticoagulation protocol: Yes   Plan:  Increase heparin  IV infusion to 1300 units/hr. Check aPTT and heparin  level daily Continue to monitor H&H and platelets  Bradi Arbuthnot, PharmD PGY-1 Pharmacy Resident [facility]  06/11/2024 5:27 PM

## 2024-06-11 NOTE — Assessment & Plan Note (Signed)
 No signs of acute decompensation  Continue with metoprolol  succinate and entresto .

## 2024-06-11 NOTE — Assessment & Plan Note (Signed)
 Continue pantoprazole.

## 2024-06-11 NOTE — Assessment & Plan Note (Signed)
 Continue blood pressure monitoring Continue statin Currently on IV heparin  for anticoagulation

## 2024-06-11 NOTE — Assessment & Plan Note (Signed)
Old records personally reviewed, neurology follow up from 08/2021. Seropositive ocular myasthenia gravis, no significant bulbar, or limb weakness noted.  He is not longer on Mestinon or prednisone, never treated with long term steroids sparing agent.   

## 2024-06-12 ENCOUNTER — Encounter: Payer: Self-pay | Admitting: Hematology and Oncology

## 2024-06-12 DIAGNOSIS — Z515 Encounter for palliative care: Secondary | ICD-10-CM

## 2024-06-12 DIAGNOSIS — I480 Paroxysmal atrial fibrillation: Secondary | ICD-10-CM | POA: Diagnosis not present

## 2024-06-12 DIAGNOSIS — I259 Chronic ischemic heart disease, unspecified: Secondary | ICD-10-CM | POA: Diagnosis not present

## 2024-06-12 DIAGNOSIS — N1831 Chronic kidney disease, stage 3a: Secondary | ICD-10-CM | POA: Diagnosis not present

## 2024-06-12 DIAGNOSIS — Z7189 Other specified counseling: Secondary | ICD-10-CM

## 2024-06-12 DIAGNOSIS — I200 Unstable angina: Secondary | ICD-10-CM | POA: Diagnosis not present

## 2024-06-12 DIAGNOSIS — I5032 Chronic diastolic (congestive) heart failure: Secondary | ICD-10-CM | POA: Diagnosis not present

## 2024-06-12 LAB — BASIC METABOLIC PANEL WITH GFR
Anion gap: 13 (ref 5–15)
BUN: 23 mg/dL (ref 8–23)
CO2: 20 mmol/L — ABNORMAL LOW (ref 22–32)
Calcium: 9.1 mg/dL (ref 8.9–10.3)
Chloride: 102 mmol/L (ref 98–111)
Creatinine, Ser: 1.24 mg/dL (ref 0.61–1.24)
GFR, Estimated: 58 mL/min — ABNORMAL LOW (ref >60)
Glucose, Bld: 90 mg/dL (ref 70–99)
Potassium: 3.9 mmol/L (ref 3.5–5.1)
Sodium: 135 mmol/L (ref 135–145)

## 2024-06-12 LAB — CBC
HCT: 36 % — ABNORMAL LOW (ref 39.0–52.0)
Hemoglobin: 11.6 g/dL — ABNORMAL LOW (ref 13.0–17.0)
MCH: 30.6 pg (ref 26.0–34.0)
MCHC: 32.2 g/dL (ref 30.0–36.0)
MCV: 95 fL (ref 80.0–100.0)
Platelets: 187 K/uL (ref 150–400)
RBC: 3.79 MIL/uL — ABNORMAL LOW (ref 4.22–5.81)
RDW: 15.4 % (ref 11.5–15.5)
WBC: 8.3 K/uL (ref 4.0–10.5)
nRBC: 0 % (ref 0.0–0.2)

## 2024-06-12 LAB — GLUCOSE, CAPILLARY
Glucose-Capillary: 82 mg/dL (ref 70–99)
Glucose-Capillary: 114 mg/dL — ABNORMAL HIGH (ref 70–99)
Glucose-Capillary: 121 mg/dL — ABNORMAL HIGH (ref 70–99)
Glucose-Capillary: 140 mg/dL — ABNORMAL HIGH (ref 70–99)

## 2024-06-12 LAB — APTT: aPTT: 73 s — ABNORMAL HIGH (ref 24–36)

## 2024-06-12 LAB — HEPARIN LEVEL (UNFRACTIONATED): Heparin Unfractionated: >1.1 [IU]/mL — ABNORMAL HIGH (ref 0.30–0.70)

## 2024-06-12 MED ORDER — ASPIRIN 81 MG PO CHEW
81.0000 mg | CHEWABLE_TABLET | ORAL | Status: AC
  Administered 2024-06-13: 81 mg via ORAL
  Filled 2024-06-12: qty 1

## 2024-06-12 MED ORDER — OXYCODONE HCL 5 MG PO TABS
10.0000 mg | ORAL_TABLET | ORAL | Status: DC | PRN
  Administered 2024-06-13: 10 mg via ORAL
  Filled 2024-06-12: qty 2

## 2024-06-12 MED ORDER — SODIUM CHLORIDE 0.9 % WEIGHT BASED INFUSION
3.0000 mL/kg/h | INTRAVENOUS | Status: DC
  Administered 2024-06-13: 3 mL/kg/h via INTRAVENOUS

## 2024-06-12 MED ORDER — SODIUM CHLORIDE 0.9 % WEIGHT BASED INFUSION
1.0000 mL/kg/h | INTRAVENOUS | Status: DC
  Administered 2024-06-13 (×2): 1 mL/kg/h via INTRAVENOUS

## 2024-06-12 NOTE — Consult Note (Signed)
 Palliative Care Consult Note                                  Date: 06/12/2024   Patient Name: Joshua Compton.  DOB: [birth year]  MRN: [id number]  Age / Sex: 83 y.o., male  PCP: Joshua Garnette KIDD, MD Referring Physician: Noralee Elidia Toribio Compton  Reason for Consultation: Establishing goals of care  HPI/Patient Profile: 83 y.o. male  with past medical history of prostate cancer, HTN, prior strokes, HLD, COPD/emphysema, GERD, HFmrEF (EF 45 to 50 % in January 2025), metastatic adenocarcinoma of the left thyroid  (unclear if primary Thyroid  or metastatic to the thyroid - palliative radiation started 7/14 & thyroid  surgery planned 06/20/24)--with bony mets to the mesentery left posterior fourth rib as demonstrated by PET scan from 04/29/2024 and CTA chest from 06/05/2024 and 06/09/2024  admitted on 06/09/2024 with atypical chest pains and elevated troponin.  Cardiologist recommends holding apixaban  and transferring to [name] on 06/13/2024 after Eliquis  washout.   Past Medical History:  Diagnosis Date   Acute heart failure with preserved ejection fraction (HFpEF) (HCC) 09/05/2023   Acute HFrEF (heart failure with reduced ejection fraction) (HCC) 09/08/2023   Acute respiratory failure with hypoxia (HCC) 09/05/2023   Allergy    Arthritis    Cancer (HCC)    prostate ca   seed implants   CHF (congestive heart failure) (HCC)    GERD (gastroesophageal reflux disease)    Glaucoma    Hyperlipidemia    Hypertension    dr cyrena     [place]   fm   Stroke [facility]) 07/2023    Subjective:   I have reviewed medical records including EPIC notes, labs and imaging, assessed the patient and then met with the patient and his daughter Joshua Compton to discuss diagnosis prognosis, GOC, EOL wishes, disposition and options.  I introduced Palliative Medicine as specialized medical care for people living with serious illness. It focuses on providing  relief from symptoms and stress of a serious illness. The goal is to improve quality of life for both the patient and the family.  Today's Discussion: Patient sitting on side of bed in no apparent distress.  His daughter Joshua and son-in-law are at bedside.  Patient reports chest pain which radiates to his back.  His chest pain is the reason for his admission.    We discussed the patient's chronic medical conditions, cancer diagnosis, and admission.  The patient has received 8 of his palliative radiation treatments and plans to have palliative chemo and thyroid  surgery.  He understands his cancer therapies are palliative.  He is eager to see what his right heart cath shows tomorrow and is hopeful it will give insight to his chest pain.  The patient has bone metastasis and has had pain associated with this which is being managed by his oncologist outpatient. Patient and family would like to have symptoms managed by outpatient palliative. Placed outpatient palliative [facility] at the [facility] at discharge.  Patient and his wife (has dementia) live with the patient's daughter Joshua Compton. He has four daughters. After retiring from the [employer]  the patient enjoyed outdoor activities such as golfing and fishing.   Discussed advanced directives.  Patient does not have ACP documents.  Would like to create HCPOA document naming his daughter Joshua Compton as his proxy decision-maker and daughter Joshua Compton as a Counsellor. Concepts specific to code status, artifical feeding and hydration, continued IV antibiotics and rehospitalization was had.  The difference between a aggressive medical intervention path and a comfort care path for this patient at this time was had. The MOST form was introduced, discussed, and completed. The patient and family outlined their wishes for the following treatment decisions:  Cardiopulmonary Resuscitation: Do Not Attempt Resuscitation (DNR/No CPR)   Medical Interventions: Limited Additional Interventions: Use medical treatment, IV fluids and cardiac monitoring as indicated, DO NOT USE intubation or mechanical ventilation. May consider use of less invasive airway support such as BiPAP or CPAP. Also provide comfort measures. Transfer to the hospital if indicated. Avoid intensive care.   Antibiotics: Antibiotics if indicated  IV Fluids: IV fluids if indicated  Feeding Tube: Feeding tube for a defined trial period (7 day trial)    Discussed the importance of continued conversation with family and the medical providers regarding overall plan of care and treatment options, ensuring decisions are within the context of the patient's values and GOCs. Placed outpatient palliative follow up at the Cancer Center at discharge.  Questions and concerns were addressed. Hard Choices booklet left for review. The family was encouraged to call with questions or concerns. PMT will continue to support holistically.   Review of Systems  Cardiovascular:  Positive for chest pain.    Objective:   Primary Diagnoses: Present on Admission:  Chest pain  PAF (paroxysmal atrial fibrillation) (HCC)  Metastatic adenocarcinoma (HCC)  Gastroesophageal reflux disease with esophagitis without hemorrhage  Centrilobular emphysema (HCC)  Chronic kidney disease, stage 3a (HCC)   Physical Exam Vitals reviewed.  Constitutional:      General: He is not in acute distress.    Appearance: He is not ill-appearing.  HENT:     Head: Normocephalic and atraumatic.  Cardiovascular:     Rate and Rhythm: Bradycardia present.  Pulmonary:     Effort: Pulmonary effort is normal.  Skin:    General: Skin is warm and dry.  Neurological:     Mental Status: He is alert and oriented to person, place, and time.  Psychiatric:        Mood and Affect: Mood normal.        Behavior: Behavior normal.     Vital Signs:  BP (!) 120/47 (BP Location: Right Arm)   Pulse (!) 54   Temp 98  F (36.7 C) (Oral)   Resp 17   Ht 6' (1.829 m)   Wt 87.1 kg   SpO2 96%   BMI 26.04 kg/m     Advanced Care Planning:   Existing Vynca/ACP Documentation: None  Primary Decision Maker: PATIENT  Code Status/Advance Care Planning: DNR   Assessment & Plan:   SUMMARY OF RECOMMENDATIONS   Continue DNR/DNI Continue treating the treatable MOST form completed today Spiritual care consult for HCPOA creation Change prn oxycodone  to every 4 hours Outpatient palliative follow up with [facility] Continued PMT support    Discussed with: Dr. Noralee  Time Total: 80 minutes    Thank you for allowing us  to participate in the care of [employer]. PMT will continue to support holistically.    Signed by: Stephane Palin, NP Palliative Medicine  Team  Team Phone # [phone] (Nights/Weekends)  06/12/2024, 2:57 PM

## 2024-06-12 NOTE — Progress Notes (Signed)
 PHARMACY - ANTICOAGULATION CONSULT NOTE  Pharmacy Consult for heparin  IV Indication: chest pain/ACS  Allergies  Allergen Reactions   Iodinated Contrast Media Anaphylaxis    Tongue swelled up when he had MRI in [facility]  Intubated for airway protection after oral and throat swelling   Pravastatin  Other (See Comments)    Muscle aches    Patient Measurements: Height: 6' (182.9 cm) Weight: 87.1 kg (192 lb 0.3 oz) IBW/kg (Calculated) : 77.6 HEPARIN  DW (KG): 87.1  Vital Signs: Temp: 98 F (36.7 C) (07/27 0310) Temp Source: Oral (07/27 0310) BP: 146/57 (07/27 0310) Pulse Rate: 57 (07/27 0310)  Labs: Recent Labs    06/09/24 0859 06/09/24 1417 06/10/24 0426 06/11/24 0955 06/11/24 1642 06/12/24 0503  HGB  --   --   --  11.5*  --  11.6*  HCT  --   --   --  35.3*  --  36.0*  PLT  --   --   --  165  --  187  APTT  --   --   --   --  56* 73*  HEPARINUNFRC  --   --   --   --  >1.10* >1.10*  CREATININE  --   --  1.28*  --   --  1.24  TROPONINIHS 541* 606* 675*  --   --   --     Estimated Creatinine Clearance: 49.5 mL/min (by C-G formula based on SCr of 1.24 mg/dL).   Medical History: Past Medical History:  Diagnosis Date   Acute heart failure with preserved ejection fraction (HFpEF) (HCC) 09/05/2023   Acute HFrEF (heart failure with reduced ejection fraction) (HCC) 09/08/2023   Acute respiratory failure with hypoxia (HCC) 09/05/2023   Allergy    Arthritis    Cancer (HCC)    prostate ca   seed implants   CHF (congestive heart failure) (HCC)    GERD (gastroesophageal reflux disease)    Glaucoma    Hyperlipidemia    Hypertension    dr cyrena     [place]   fm   Stroke [facility]) 07/2023    Medications:  Scheduled:   amiodarone   200 mg Oral Daily   atorvastatin   40 mg Oral Daily   budesonide -glycopyrrolate -formoterol   2 puff Inhalation BID   cholecalciferol   1,000 Units Oral Daily   dorzolamide -timolol   1 drop Both Eyes Daily   insulin  aspart  0-5 Units  Subcutaneous QHS   insulin  aspart  0-6 Units Subcutaneous TID WC   metoprolol  succinate  12.5 mg Oral Daily   oxyCODONE   10 mg Oral Q12H   pantoprazole   40 mg Oral Daily   predniSONE   50 mg Oral Q breakfast   rOPINIRole   1 mg Oral TID   sacubitril -valsartan   1 tablet Oral BID   sodium chloride  flush  3 mL Intravenous Q12H   sodium chloride  flush  3 mL Intravenous Q12H    Assessment: 83 yo M presenting with chest pain. PTA Eliquis  (reduced dose; LD 7/25 0829). Pharmacy consulted for heparin  management.   Pt aPTT therapeutic at 73 sec on heparin  1300 units/h. CBC stable. Plan for [facility] 7/28.  Goal of Therapy:  Goal aPTT 66-102 Heparin  level 0.3-0.7 units/ml Monitor platelets by anticoagulation protocol: Yes   Plan:  Continue heparin  infusion at 1300 units/hr IV Check anti-Xa level daily while on heparin  Continue to monitor H&H and platelets   Deniyah Dillavou, PharmD PGY1 Clinical Pharmacist [name]  06/12/2024 7:12 AM

## 2024-06-12 NOTE — Progress Notes (Signed)
 Progress Note  Patient Name: Joshua Compton. Date of Encounter: 06/12/2024  [facility] Cardiologist: Alvan Carrier, MD    Subjective   No chest pain overnight- plan for [facility] tomorrow. On IV heparin . Labs stable today - H/H unchanged.   Inpatient Medications    Scheduled Meds:  amiodarone   200 mg Oral Daily   atorvastatin   40 mg Oral Daily   budesonide -glycopyrrolate -formoterol   2 puff Inhalation BID   cholecalciferol   1,000 Units Oral Daily   dorzolamide -timolol   1 drop Both Eyes Daily   insulin  aspart  0-5 Units Subcutaneous QHS   insulin  aspart  0-6 Units Subcutaneous TID WC   metoprolol  succinate  12.5 mg Oral Daily   oxyCODONE   10 mg Oral Q12H   pantoprazole   40 mg Oral Daily   predniSONE   50 mg Oral Q breakfast   rOPINIRole   1 mg Oral TID   sacubitril -valsartan   1 tablet Oral BID   sodium chloride  flush  3 mL Intravenous Q12H   sodium chloride  flush  3 mL Intravenous Q12H   Continuous Infusions:  heparin  1,300 Units/hr (06/12/24 0700)   PRN Meds: acetaminophen  **OR** acetaminophen , bisacodyl , ondansetron  **OR** ondansetron  (ZOFRAN ) IV, mouth rinse, oxyCODONE , polyethylene glycol, sodium chloride  flush, traZODone    Vital Signs    Vitals:   06/11/24 2104 06/12/24 0018 06/12/24 0310 06/12/24 0816  BP:  (!) 140/52 (!) 146/57 (!) 129/53  Pulse: (!) 56 (!) 57 (!) 57 (!) 51  Resp: 15 16 17 17   Temp:  97.6 F (36.4 C) 98 F (36.7 C) 97.6 F (36.4 C)  TempSrc:  Oral Oral Oral  SpO2: 96% 97% 95% 97%  Weight:      Height:        Intake/Output Summary (Last 24 hours) at 06/12/2024 0859 Last data filed at 06/12/2024 0700 Gross per 24 hour  Intake 807.44 ml  Output --  Net 807.44 ml      06/10/2024    8:12 PM 06/09/2024    5:50 AM 06/07/2024   12:22 PM  Last 3 Weights  Weight (lbs) 192 lb 0.3 oz 192 lb 192 lb 7 oz  Weight (kg) 87.1 kg 87.091 kg 87.289 kg      Telemetry    Sinus bradycardia- Personally Reviewed  ECG    N/A  Physical  Exam   GEN: No acute distress.   Neck: No JVD Cardiac: RRR, no murmurs, rubs, or gallops.  Respiratory: Clear to auscultation bilaterally. GI: Soft, nontender, non-distended  MS: No edema; No deformity. Neuro:  Nonfocal  Psych: Normal affect   Labs    High Sensitivity Troponin:   Recent Labs  Lab 06/05/24 1524 06/09/24 0641 06/09/24 0859 06/09/24 1417 06/10/24 0426  TROPONINIHS 70* 504* 541* 606* 675*      Chemistry Recent Labs  Lab 06/05/24 1525 06/07/24 1135 06/09/24 0641 06/10/24 0426 06/12/24 0503  NA 139 139 139 139 135  K 4.9 4.2 3.9 4.5 3.9  CL 100 103 105 107 102  CO2 28 30 25 22  20*  GLUCOSE 100* 106* 96 100* 90  BUN 24* 26* 20 22 23   CREATININE 1.82* 1.62* 1.31* 1.28* 1.24  CALCIUM  9.7 9.4 9.5 9.3 9.1  PROT 7.4 6.9 7.1  --   --   ALBUMIN 3.7 3.7 3.5 3.0*  --   AST 29 24 26   --   --   ALT 38 26 22  --   --   ALKPHOS 127* 206* 262*  --   --  BILITOT 0.9 0.5 1.1  --   --   GFRNONAA 36* 42* 54* 56* 58*  ANIONGAP 11 6 9 10 13      Hematology Recent Labs  Lab 06/09/24 0641 06/11/24 0955 06/12/24 0503  WBC 7.0 8.2 8.3  RBC 4.01* 3.77* 3.79*  HGB 12.2* 11.5* 11.6*  HCT 38.8* 35.3* 36.0*  MCV 96.8 93.6 95.0  MCH 30.4 30.5 30.6  MCHC 31.4 32.6 32.2  RDW 15.8* 15.4 15.4  PLT 177 165 187    BNP Recent Labs  Lab 06/05/24 1320  BNP 79.5     DDimer  Recent Labs  Lab 06/05/24 1340 06/09/24 0859  DDIMER >20.00* >20.00*     CHA2DS2-VASc Score = 7   This indicates a 11.2% annual risk of stroke. The patient's score is based upon: CHF History: 1 HTN History: 1 Diabetes History: 0 Stroke History: 2 Vascular Disease History: 1 Age Score: 2 Gender Score: 0   Radiology    No results found.   Patient Profile     83 y.o. male with a history ofPAF, CVA, HFmrEF, HLD, HTN. He has been in NSR No history of CAD but no stress testing done TTE dopne 12/16/23 with EF 45-50% normal RV and AV sclerosis.  Admitted with chest pain and an  elevated troponin at 504.  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  Assessment & Plan    Chest pain -He has no history of CAD but does have a history of mild LV dysfunction EF 45 to 50% by echo 12/07/2023 -His pain is atypical and seems to be more related to bony mets that he has been taking narcotics for -Troponin was elevated at 500 on admit with a flat trend up to 675 -Repeat 2D echo yesterday showed EF 50 to 55% with global HK worse in the inferior walls, mild LVH, G1 DD, normal RV and mild to moderate AI - This morning he is complaining of 8 out of 10 chest pain.  It is difficult for him to describe it but says it is not in his chest wall.  He does have some pain in his back but it is not what his typical back pain from his mets is like. - Although on 2D echo EF is improved from 45 to 50% now to 50 to 55% but now mentions more prominent inferior hypokinesis. - Given slightly uptrending troponin and ongoing chest pain I think we need to define his coronary anatomy. - He does have a history of CKD but his serum creatinine is 1.23 today.  Therefore I think we should send him down to [facility] for a left and possible cardiac cath. Informed Consent   Shared Decision Making/Informed Consent The risks [stroke (1 in 1000), death (1 in 1000), kidney failure [usually temporary] (1 in 500), bleeding (1 in 200), allergic reaction [possibly serious] (1 in 200)], benefits (diagnostic support and management of coronary artery disease) and alternatives of a cardiac catheterization were discussed in detail with Joshua Compton and he is willing to proceed. -Plan for [facility] tentatively on Monday - cath orders are placed and need to be released for tomorrow.  NPO p MN. -Will need to be off Eliquis  for 48 hours prior to left heart cath which we will plan for Monday -Would  recommend course only cath given his  history of CKD in the past -Hold Entresto  for now to protect kidneys for -Transition Eliquis  to IV heparin  drip per pharmacy -Start aspirin  81 mg daily -continue Toprol  XL 12.5 mg daily -Continue Crestor  40 mg daily  Paroxysmal atrial fibrillation -He is maintaining normal sinus rhythm -Continue Eliquis  2.5 mg twice daily dosed for age>80 and SCr>1.5 although his serum creatinine is normal today it has been as high as 2 earlier in the year and 1.6 when he came in -For now we will hold Eliquis  did not need for -Will consult pharmacy for IV heparin  drip  Chronic combined systolic/diastolic CHF - Last EF this admission 50 to 55% but had been 45 to 50% by echo 12/07/2023 - He does not appear volume overloaded on exam today - Continue Toprol  XL 12.5 mg daily - Will hold Entresto  due to borderline serum creatinine and need for cath on Monday - He is not on MRA or SGLT2 given history of AKI with serum creatinines as high as the 2's.  If renal function remains stable going forward could consider addition of these   For questions or updates, please contact [facility] Please consult [email or web address] for contact info under   Vinie KYM Maxcy, MD, [facility], FNLA, FACP  [place]  [facility]  Medical Director of the Advanced Lipid Disorders &  Cardiovascular Risk Reduction Clinic Diplomate of the American Board of Clinical Lipidology Attending Cardiologist  Direct Dial: [phone]  Fax: [phone]  Website:  [email or web address]  Vinie JAYSON Maxcy, MD  06/12/2024, 8:59 AM

## 2024-06-12 NOTE — Plan of Care (Signed)
  Problem: Education: Goal: Knowledge of General Education information will improve Description: Including pain rating scale, medication(s)/side effects and non-pharmacologic comfort measures Outcome: Progressing   Problem: Health Behavior/Discharge Planning: Goal: Ability to manage health-related needs will improve Outcome: Progressing   Problem: Clinical Measurements: Goal: Will remain free from infection Outcome: Progressing Goal: Respiratory complications will improve Outcome: Progressing Goal: Cardiovascular complication will be avoided Outcome: Progressing   Problem: Nutrition: Goal: Adequate nutrition will be maintained Outcome: Progressing   Problem: Pain Managment: Goal: General experience of comfort will improve and/or be controlled Outcome: Progressing   Problem: Safety: Goal: Ability to remain free from injury will improve Outcome: Progressing

## 2024-06-12 NOTE — Progress Notes (Signed)
     Referral received for Joshua Compton.: goals of care discussion. Chart reviewed and updates received from RN. Patient would like his daughter/HCPOA Joshua Compton at goals of care discussion.   I was able to speak with Joshua Compton. She is planning on coming to hospital today but is unsure of the time. Joshua will call PMT when she know the time so we can set up GOC meeting.   12:45 pm: Attempted to call Joshua Compton to follow up. Left voicemail message requesting callback.  Detailed note and recommendations to follow once GOC has been completed.   Thank you for your referral and allowing PMT to assist in Joshua Aceituno Jr.'s care.   Stephane Palin, NP Palliative Medicine Team  Team Phone # [phone]   NO CHARGE

## 2024-06-12 NOTE — Progress Notes (Addendum)
 Progress Note   Patient: Joshua Compton. FMW:[id number] DOB: 1941-05-08 DOA: 06/09/2024     2 DOS: the patient was seen and examined on 06/12/2024   Brief hospital course: Mr. Joshua Compton was admitted to the hospital with the working diagnosis of chest pain.   83 y.o. 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 Recently started on radiation therapy.  Reported chest pain with radiation to the back.  On his initial physical examination his blood pressure was 166/59, HR 64, RR 18 and 02 saturation 92% Lungs with no wheezing or rales, heart with S1 and S2 present and regular, abdomen with no distention and no lower extremity edema. Residual right sided hemiparesis. Noted reproducible pain to chest wall palpation.   Na 139, K 3,9 Cl 105 bicarbonate 25 glucose 96 bun 20 cr 1,31  High sensitive troponin 504, 531 and 606  Wbc 7,0 hgb 12,2 plt 177  D dimer > 20   Chest radiograph with hyperinflation and increased lung markings at the bases. No infiltrates or effusions.  CT chest with no pulmonary embolism. Multiple pulmonary nodules, subcutaneous soft tissue nodules and left posterior fourth rib lytic expansile lesion, stable to prior.  Stable right thyroid  nodule.  Signs of pulmonary fibrosis and emphysema.  Left lower lobe subpleural consolidation/ mass versus round atelectasis stable to prior, but gradually enlarging from 2024.   EKG 62 bpm, normal axis, normal intervals, qtc 444, sinus rhythm with no significant ST segment or T wave changes.   Transferring to [facility] for possible LHC on 06/13/2024 after Eliquis  washout  07/26 transfer to [facility]. Plan for cardiac catheterization on 07/28     Assessment and Plan: * Chest pain Possible NSTEMI Echocardiogram with worse inferior wall hypokinesis.    Plan to continue anticoagulation with IV heparin .  Continue atorvastatin  and metoprolol .  Plan for cardiac catheterization on this admission.   PAF (paroxysmal atrial fibrillation) (HCC) Continue amiodarone  for rate and rhythm control Anticoagulation with IV heparin  in preparation for cardiac catheterization.  Continue telemetry monitoring   Chronic kidney disease, stage 3a (HCC) Stable renal function with serum cr at 1,24 with K at 3,9 and serum bicarbonate at 20  Na 135  Continue close follow up renal function and electrolytes.   Chronic diastolic CHF (congestive heart failure) (HCC) No signs of acute decompensation  Continue with metoprolol  succinate and entresto .   Centrilobular emphysema (HCC) No signs of acute exacerbation   Metastatic adenocarcinoma [facility]) Patient on radiation therapy, Awaiting placement of Port A cath for palliative chemotherapy.   History of prostate cancer.   History of CVA (cerebrovascular accident) Continue blood pressure monitoring Continue statin Currently on IV heparin  for anticoagulation   Gastroesophageal reflux disease with esophagitis without hemorrhage Continue pantoprazole    Type 2 diabetes mellitus with hyperlipidemia (HCC) Continue insulin  sliding scale for glucose cover and monitoring  Continue statin therapy.       Subjective: patient had chest pain early this morning but subsided by the time of my examination   Physical Exam: Vitals:   06/12/24 0310 06/12/24 0816 06/12/24 0822 06/12/24 1157  BP: (!) 146/57 (!) 129/53  (!) 120/47  Pulse: (!) 57 (!) 51  (!) 54  Resp: 17 17  17   Temp: 98 F (36.7 C) 97.6 F (36.4 C)  98 F (36.7 C)  TempSrc: Oral Oral  Oral  SpO2: 95% 97% 97% 96%  Weight:      Height:       Neurology awake and alert ENT with mild pallor Cardiovascular with S1 and S2 present and regular with no gallops, rubs or murmurs No JVD No lower extremity edema Respiratory with no rales or wheezing, no  rhonchi  Abdomen with no distention   Data Reviewed:    Family Communication: no family at the bedside   Disposition: Status is: Inpatient Remains inpatient appropriate because: pending cardiac catheterization   Planned Discharge Destination: Home     Author: Elidia Toribio Furnace, MD 06/12/2024 1:09 PM  For on call review [email or web address].

## 2024-06-13 ENCOUNTER — Encounter (HOSPITAL_COMMUNITY): Payer: Self-pay | Admitting: Cardiology

## 2024-06-13 ENCOUNTER — Ambulatory Visit

## 2024-06-13 ENCOUNTER — Ambulatory Visit: Payer: Self-pay

## 2024-06-13 ENCOUNTER — Encounter (HOSPITAL_COMMUNITY)
Admission: EM | Disposition: A | Discharge status: Home / Self Care | Payer: Self-pay | Source: Home / Self Care | Attending: Family Medicine

## 2024-06-13 DIAGNOSIS — I259 Chronic ischemic heart disease, unspecified: Secondary | ICD-10-CM | POA: Diagnosis not present

## 2024-06-13 DIAGNOSIS — N1831 Chronic kidney disease, stage 3a: Secondary | ICD-10-CM | POA: Diagnosis not present

## 2024-06-13 DIAGNOSIS — I5032 Chronic diastolic (congestive) heart failure: Secondary | ICD-10-CM | POA: Diagnosis not present

## 2024-06-13 DIAGNOSIS — Z515 Encounter for palliative care: Secondary | ICD-10-CM | POA: Diagnosis not present

## 2024-06-13 DIAGNOSIS — C799 Secondary malignant neoplasm of unspecified site: Secondary | ICD-10-CM | POA: Diagnosis not present

## 2024-06-13 DIAGNOSIS — I480 Paroxysmal atrial fibrillation: Secondary | ICD-10-CM | POA: Diagnosis not present

## 2024-06-13 DIAGNOSIS — Z8673 Personal history of transient ischemic attack (TIA), and cerebral infarction without residual deficits: Secondary | ICD-10-CM | POA: Diagnosis not present

## 2024-06-13 DIAGNOSIS — Z66 Do not resuscitate: Secondary | ICD-10-CM | POA: Diagnosis not present

## 2024-06-13 DIAGNOSIS — I214 Non-ST elevation (NSTEMI) myocardial infarction: Secondary | ICD-10-CM | POA: Diagnosis not present

## 2024-06-13 DIAGNOSIS — C7951 Secondary malignant neoplasm of bone: Secondary | ICD-10-CM | POA: Diagnosis not present

## 2024-06-13 DIAGNOSIS — I2510 Atherosclerotic heart disease of native coronary artery without angina pectoris: Secondary | ICD-10-CM | POA: Diagnosis not present

## 2024-06-13 HISTORY — PX: CORONARY STENT INTERVENTION: CATH118234

## 2024-06-13 HISTORY — PX: LEFT HEART CATH AND CORONARY ANGIOGRAPHY: CATH118249

## 2024-06-13 HISTORY — PX: CORONARY LITHOTRIPSY: CATH118330

## 2024-06-13 HISTORY — PX: CORONARY IMAGING/OCT: CATH118326

## 2024-06-13 LAB — APTT: aPTT: 66 s — ABNORMAL HIGH (ref 24–36)

## 2024-06-13 LAB — GLUCOSE, CAPILLARY
Glucose-Capillary: 84 mg/dL (ref 70–99)
Glucose-Capillary: 106 mg/dL — ABNORMAL HIGH (ref 70–99)
Glucose-Capillary: 113 mg/dL — ABNORMAL HIGH (ref 70–99)
Glucose-Capillary: 124 mg/dL — ABNORMAL HIGH (ref 70–99)

## 2024-06-13 LAB — HEPARIN LEVEL (UNFRACTIONATED): Heparin Unfractionated: 0.77 [IU]/mL — ABNORMAL HIGH (ref 0.30–0.70)

## 2024-06-13 LAB — POCT ACTIVATED CLOTTING TIME
Activated Clotting Time: 314 s
Activated Clotting Time: 273 s
Activated Clotting Time: 308 s

## 2024-06-13 SURGERY — LEFT HEART CATH AND CORONARY ANGIOGRAPHY
Anesthesia: LOCAL

## 2024-06-13 MED ORDER — METHYLPREDNISOLONE SODIUM SUCC 125 MG IJ SOLR
INTRAMUSCULAR | Status: AC
  Filled 2024-06-13: qty 2

## 2024-06-13 MED ORDER — DIPHENHYDRAMINE HCL 50 MG/ML IJ SOLN
INTRAMUSCULAR | Status: AC
  Filled 2024-06-13: qty 1

## 2024-06-13 MED ORDER — CLOPIDOGREL BISULFATE 300 MG PO TABS
ORAL_TABLET | ORAL | Status: AC
Start: 2024-06-13 — End: 2024-06-13
  Filled 2024-06-13: qty 1

## 2024-06-13 MED ORDER — HEPARIN SODIUM (PORCINE) 1000 UNIT/ML IJ SOLN
INTRAMUSCULAR | Status: AC
  Filled 2024-06-13: qty 10

## 2024-06-13 MED ORDER — HEPARIN SODIUM (PORCINE) 1000 UNIT/ML IJ SOLN
INTRAMUSCULAR | Status: DC | PRN
Start: 2024-06-13 — End: 2024-06-13
  Administered 2024-06-13: 4500 [IU] via INTRAVENOUS
  Administered 2024-06-13: 2500 [IU] via INTRAVENOUS
  Administered 2024-06-13: 4500 [IU] via INTRAVENOUS

## 2024-06-13 MED ORDER — DIPHENHYDRAMINE HCL 50 MG/ML IJ SOLN
INTRAMUSCULAR | Status: DC | PRN
  Administered 2024-06-13: 12.5 mg via INTRAVENOUS

## 2024-06-13 MED ORDER — APIXABAN 5 MG PO TABS
5.0000 mg | ORAL_TABLET | Freq: Two times a day (BID) | ORAL | Status: DC
  Administered 2024-06-14: 5 mg via ORAL
  Filled 2024-06-13: qty 1

## 2024-06-13 MED ORDER — SODIUM CHLORIDE 0.9% FLUSH: 3.0000 mL | INTRAVENOUS | Status: DC | PRN

## 2024-06-13 MED ORDER — DIPHENHYDRAMINE HCL 50 MG/ML IJ SOLN: 50.0000 mg | Freq: Once | INTRAMUSCULAR | Status: DC

## 2024-06-13 MED ORDER — ASPIRIN 81 MG PO CHEW
81.0000 mg | CHEWABLE_TABLET | Freq: Every day | ORAL | Status: DC
  Administered 2024-06-14: 81 mg via ORAL
  Filled 2024-06-13: qty 1

## 2024-06-13 MED ORDER — SODIUM CHLORIDE 0.9 % WEIGHT BASED INFUSION
1.0000 mL/kg/h | INTRAVENOUS | Status: AC
  Administered 2024-06-14: 1 mL/kg/h via INTRAVENOUS

## 2024-06-13 MED ORDER — VERAPAMIL HCL 2.5 MG/ML IV SOLN
INTRAVENOUS | Status: AC
  Filled 2024-06-13: qty 2

## 2024-06-13 MED ORDER — LIDOCAINE HCL (PF) 1 % IJ SOLN
INTRAMUSCULAR | Status: DC | PRN
Start: 2024-06-13 — End: 2024-06-13
  Administered 2024-06-13: 2 mL

## 2024-06-13 MED ORDER — CLOPIDOGREL BISULFATE 300 MG PO TABS
ORAL_TABLET | ORAL | Status: DC | PRN
  Administered 2024-06-13: 600 mg via ORAL

## 2024-06-13 MED ORDER — HYDRALAZINE HCL 20 MG/ML IJ SOLN
10.0000 mg | INTRAMUSCULAR | Status: AC | PRN
Start: 2024-06-13 — End: 2024-06-14

## 2024-06-13 MED ORDER — LIDOCAINE HCL (PF) 1 % IJ SOLN
INTRAMUSCULAR | Status: AC
  Filled 2024-06-13: qty 30

## 2024-06-13 MED ORDER — CLOPIDOGREL BISULFATE 300 MG PO TABS
ORAL_TABLET | ORAL | Status: AC
  Filled 2024-06-13: qty 1

## 2024-06-13 MED ORDER — LABETALOL HCL 5 MG/ML IV SOLN: 10.0000 mg | INTRAVENOUS | Status: AC | PRN

## 2024-06-13 MED ORDER — IOHEXOL 350 MG/ML SOLN
INTRAVENOUS | Status: DC | PRN
  Administered 2024-06-13: 210 mL

## 2024-06-13 MED ORDER — DIPHENHYDRAMINE HCL 25 MG PO CAPS: 50.0000 mg | ORAL_CAPSULE | Freq: Once | ORAL | Status: DC

## 2024-06-13 MED ORDER — SODIUM CHLORIDE 0.9 % IV SOLN
250.0000 mL | INTRAVENOUS | Status: DC | PRN
Start: 2024-06-14 — End: 2024-06-15

## 2024-06-13 MED ORDER — VERAPAMIL HCL 2.5 MG/ML IV SOLN
INTRAVENOUS | Status: DC | PRN
  Administered 2024-06-13: 10 mL via INTRA_ARTERIAL

## 2024-06-13 MED ORDER — SODIUM CHLORIDE 0.9% FLUSH
3.0000 mL | Freq: Two times a day (BID) | INTRAVENOUS | Status: DC
  Administered 2024-06-14 (×2): 3 mL via INTRAVENOUS

## 2024-06-13 MED ORDER — HEPARIN (PORCINE) IN NACL 1000-0.9 UT/500ML-% IV SOLN
INTRAVENOUS | Status: DC | PRN
Start: 2024-06-13 — End: 2024-06-13
  Administered 2024-06-13 (×3): 500 mL

## 2024-06-13 MED ORDER — CLOPIDOGREL BISULFATE 75 MG PO TABS
75.0000 mg | ORAL_TABLET | Freq: Every day | ORAL | Status: DC
  Administered 2024-06-14: 75 mg via ORAL
  Filled 2024-06-13: qty 1

## 2024-06-13 MED ORDER — METHYLPREDNISOLONE SODIUM SUCC 125 MG IJ SOLR
INTRAMUSCULAR | Status: DC | PRN
  Administered 2024-06-13: 40 mg via INTRAVENOUS

## 2024-06-13 MED ORDER — METHYLPREDNISOLONE SODIUM SUCC 40 MG IJ SOLR
40.0000 mg | INTRAMUSCULAR | Status: DC
  Administered 2024-06-13 (×2): 40 mg via INTRAVENOUS
  Filled 2024-06-13 (×2): qty 1

## 2024-06-13 SURGICAL SUPPLY — 20 items
BALLOON EMERGE MR 2.5X15 (BALLOONS) IMPLANT
BALLOON SAPPHIRE 3.5X15 (BALLOONS) IMPLANT
BALLOON SAPPHIRE NC24 3.75X22 (BALLOONS) IMPLANT
BALLOON SCOREFLEX 3.50X10 (BALLOONS) IMPLANT
CATH DRAGONFLY OPSTAR (CATHETERS) IMPLANT
CATH GUIDELINER COAST (CATHETERS) IMPLANT
CATH INFINITI 5 FR JL3.5 (CATHETERS) IMPLANT
CATH INFINITI 5FR JL4 (CATHETERS) IMPLANT
CATH INFINITI AMBI 5FR TG (CATHETERS) IMPLANT
CATH SHOCKWAVE C2 3.5X12 (CATHETERS) IMPLANT
CATH VISTA GUIDE 6FR XBLD 3.5 (CATHETERS) IMPLANT
DEVICE RAD COMP TR BAND LRG (VASCULAR PRODUCTS) IMPLANT
GLIDESHEATH SLEND SS 6F .021 (SHEATH) IMPLANT
GUIDEWIRE INQWIRE 1.5J.035X260 (WIRE) IMPLANT
KIT ENCORE 26 ADVANTAGE (KITS) IMPLANT
PACK CARDIAC CATHETERIZATION (CUSTOM PROCEDURE TRAY) ×1 IMPLANT
SET ATX-X65L (MISCELLANEOUS) IMPLANT
SHEATH PROBE COVER 6X72 (BAG) IMPLANT
STENT ONYX FRONTIER 3.5X38 (Permanent Stent) IMPLANT
WIRE ASAHI PROWATER 180CM (WIRE) IMPLANT

## 2024-06-13 NOTE — Progress Notes (Signed)
 PHARMACY - ANTICOAGULATION CONSULT NOTE  Pharmacy Consult for heparin  IV Indication: chest pain/ACS  Allergies  Allergen Reactions   Iodinated Contrast Media Anaphylaxis    Tongue swelled up when he had MRI in [facility]  Intubated for airway protection after oral and throat swelling   Pravastatin  Other (See Comments)    Muscle aches    Patient Measurements: Height: 6' (182.9 cm) Weight: 85 kg (187 lb 8 oz) IBW/kg (Calculated) : 77.6 HEPARIN  DW (KG): 87.1  Vital Signs: Temp: 98 F (36.7 C) (07/28 0731) Temp Source: Oral (07/28 0731) BP: 130/53 (07/28 0731) Pulse Rate: 56 (07/28 0731)  Labs: Recent Labs    06/11/24 0955 06/11/24 1642 06/12/24 0503 06/13/24 0500  HGB 11.5*  --  11.6*  --   HCT 35.3*  --  36.0*  --   PLT 165  --  187  --   APTT  --  56* 73* 66*  HEPARINUNFRC  --  >1.10* >1.10* 0.77*  CREATININE  --   --  1.24  --     Estimated Creatinine Clearance: 49.5 mL/min (by C-G formula based on SCr of 1.24 mg/dL).   Medical History: Past Medical History:  Diagnosis Date   Acute heart failure with preserved ejection fraction (HFpEF) (HCC) 09/05/2023   Acute HFrEF (heart failure with reduced ejection fraction) (HCC) 09/08/2023   Acute respiratory failure with hypoxia (HCC) 09/05/2023   Allergy    Arthritis    Cancer (HCC)    prostate ca   seed implants   CHF (congestive heart failure) (HCC)    GERD (gastroesophageal reflux disease)    Glaucoma    Hyperlipidemia    Hypertension    dr cyrena     [place]   fm   Stroke [facility]) 07/2023    Medications:  Scheduled:   amiodarone   200 mg Oral Daily   atorvastatin   40 mg Oral Daily   budesonide -glycopyrrolate -formoterol   2 puff Inhalation BID   cholecalciferol   1,000 Units Oral Daily   diphenhydrAMINE   50 mg Oral Once   Or   diphenhydrAMINE   50 mg Intravenous Once   dorzolamide -timolol   1 drop Both Eyes Daily   insulin  aspart  0-5 Units Subcutaneous QHS   insulin  aspart  0-6 Units Subcutaneous TID WC    methylPREDNISolone  (SOLU-MEDROL ) injection  40 mg Intravenous Q4H   metoprolol  succinate  12.5 mg Oral Daily   oxyCODONE   10 mg Oral Q12H   pantoprazole   40 mg Oral Daily   rOPINIRole   1 mg Oral TID   sacubitril -valsartan   1 tablet Oral BID   sodium chloride  flush  3 mL Intravenous Q12H   sodium chloride  flush  3 mL Intravenous Q12H    Assessment: 83 yo M presenting with chest pain. PTA Eliquis  (reduced dose; LD 7/25 0829). Pharmacy consulted for heparin  management.   Pt aPTT therapeutic at 66 sec on heparin  1300 units/h. CBC stable. Plan for [facility] 7/28.  Goal of Therapy:  Goal aPTT 66-102 Heparin  level 0.3-0.7 units/ml Monitor platelets by anticoagulation protocol: Yes   Plan:  Continue heparin  infusion at 1300 units/hr IV Will follow plans post cath  Prentice Poisson, PharmD Clinical Pharmacist **Pharmacist phone directory can now be found on [email or web address] ([email or web address]).  Listed under [facility].

## 2024-06-13 NOTE — Progress Notes (Signed)
 PT admited on 06/09/24.  Positive for MI.  Scheduled for Cath on 06/13/24.  Spoke with [facility] Triage and amde them aware. To let Dr Eletha be aware.  Thyroid  lobectomy scheduled for 06/27/24.

## 2024-06-13 NOTE — Progress Notes (Addendum)
 Received a page by the RN that Mr. Joshua Compton accidentally removed his TR band. Cath lab was called and they held pressure and replaced TR band prior to my arrival. No hematoma is present. Mr. Joshua Compton is resting in bed among of pool of blood stain sheets.   Plan: - We will await 1 hours before resuming the TR removal protocol - [place] on opposite hand to decrease the likelihood of recurrence; if this were to occur again, we will restrain the hand opposite of the TR band  -Hgb/hct and PTT ordered   Merlene Blood, MD MS  Cardiology [facility]

## 2024-06-13 NOTE — Brief Op Note (Addendum)
 BRIEF CARDIAC CATHETERIZATION REPORT  06/13/2024 5:38 PM  ID: Joshua Compton. MRN: [id number]   Primary Care Provider: [name], MD [facility] Cardiologist: Alvan Carrier, MD  Referring cardiologist: Dr. Peter [place]  SURGEON:  Surgeons and Role:    * Anner Alm ORN, MD - Primary  PROCEDURE:  Procedure(s): LEFT HEART CATH AND CORONARY ANGIOGRAPHY (N/A) CORONARY STENT INTERVENTION (N/A) CORONARY LITHOTRIPSY (N/A) CORONARY IMAGING/OCT (N/A)   PATIENT:  Joshua Compton.  83 y.o. male metastatic adenoCA presents with NSTEMI and progressive chest pain. On Eliquis  for Pafib. Now on hold. Echo shows inferior HK with EF 50-55%. He has a history of HTN, HLD, DM.  He has a listed hypersensitivity to contrast therefore was premedicated with planned left heart catheterization with possible PCI.  PRE-OPERATIVE DIAGNOSIS:  nstemi  POST-OPERATIVE DIAGNOSIS:   Codominant system Severe two-vessel disease: 80% calcified/fibrotic stenosis in the mid LCx just after OM1 followed by a 90% focal heavily calcified lesion just after OM 2 going into the AV groove. Successful complex OCT guided shockwave lithotripsy PTCA and DES PCI of mid and distal LCx lesions treated with Onyx frontier DES 3.5 mm x 38 mm postdilated and taper fashion from 3.75 to 3.6 mm reducing lesion to 0% with TIMI-3 flow maintained. Focal 70% LAD between 1st & 2nd Diag branches Consider staged PCI at a later date to allow recovery from prolonged procedure. Normal codominant RCA with borderline 50% stenosis in the mid PDA. Normal LVEDP.  PROCEDURE PERFORMED Time Out: Verified patient identification, verified procedure, site/side was marked, verified correct patient position, special equipment/implants available, medications/allergies/relevent history reviewed, required imaging and test results available. Performed.  Access:  *RIGHT radial Artery: 6 Fr sheath -- Seldinger technique using  Micropuncture Kit -- Direct ultrasound guidance used.  Permanent image obtained and placed on chart. -- 10 mL radial cocktail IA; 4500 units IV Heparin   Left Heart Catheterization: 5 and 6 Fr Catheters advanced or exchanged over a J-wire under direct fluoroscopic guidance into the ascending aorta; TIG 4.0 catheter advanced first.  * LV Hemodynamics (No LV Gram) & Right Coronary Artery Cineangiography: TIG 4.0 Catheter  * Left Coronary Artery Cineangiography: JL 4 catheter; 6 French XB LAD 3.5 guide catheter   Review of initial angiography revealed: Two-vessel disease as noted above  Preparations are made for OCT guided PCI of LCx and then LAD if time permits.  PCI performed: See FINDINGS -> 6 [place] XB LAD 3.5 guide catheter; P[place]ater wire = initial angioplasty with a 2.5 mm x 15 mm> balloon followed by OCT (after angioplasty the culprit lesion MLA was 1.4 mm); -> was determined that we would likely require shockwave lithotripsy.  The lithotripsy balloon would not advance beyond the OM1, therefore the 2.5 mm balloon was reinserted to assist with advancement of the GuideLiner catheter which allowed advancement of the Shockwave Lithotripsy 3.5 mm x 12 mm => 4 rounds of 10 pulses used for the more distal 90% lesion and 3 pulses for the more proximal 80% lesion -> both segments were predilated with a 3.5 mm x 15 mm balloon to 10 ATM showing full expansion => 3.5 mm x 38 mm Onyx Frontier DES was advanced to cover both lesions and deployed to 16 ATM x 30 seconds; postdilated with 3.75 mm x 20 mm [place] balloon-2 inflations 14 ATM x 20 seconds-tapered postdilation from 3.75 to 3.6 mm. = TIMI-3 flow maintained, the entire segment was reduced to 0%>   Upon completion of Angiogaphy, the  catheter was removed completely out of the body over a wire, without complication.    Radial sheath removed in the Cardiac Catheterization lab with TR Band placed for hemostasis.  TR Band: 1730  Hours; 12 mL air; due to  calcified vessels, unable to Doppler pulses for plethysmography to check [place].  MEDICATIONS SQ Lidocaine  3 mL Radial Cocktail: 3 mg Verapmil in 10 mL NS Heparin : Total 11,500 units Clopidogrel  600 mg p.o. Benadryl  12.5 mg; Solu-Medrol  40 mg   ANESTHESIA:   local and 12.5 mg IV Benadryl   EBL:  <50 mL  COUNTS:  YES  PATIENT DISPOSITION:  PACU - hemodynamically stable.  The patient was stable.  During and postprocedure with no complications.  He did require O2 by nasal cannula as he was quite heavily sedated with the very low-dose of Benadryl .  DICTATION: .Note written in EPIC  PLAN OF CARE: Return to nursing unit for ongoing care post PCI; recommend aspirin  and Plavix  x 1 month along with restarting DOAC in the morning => with long stent would try to continue Plavix  DOAC to complete 1 year given ACS presentation but stop aspirin  after 1 month; for the remaining LAD lesion, discussed the possibility of medical management versus staged PCI next week to allow time for full recovery. -> Restart DOAC tomorrow morning    Delay start of Pharmacological VTE agent (>24hrs) due to surgical blood loss or risk of bleeding: not applicable

## 2024-06-13 NOTE — Progress Notes (Signed)
 This chaplain is responding to PMT NP-Dawn consult for creating HCPOA. The chaplain understands the Pt. document is in his bedside drawer.  The Pt. is sleeping at the time of the visit. The chaplain understands the Pt. is scheduled for a heart cath at 1500 today.  This chaplain will attempt a revisit.  Chaplain Leeroy Hummer [phone]

## 2024-06-13 NOTE — Progress Notes (Signed)
 Progress Note   Patient: Joshua Compton. FMW:[id number] DOB: Jan 17, 1941 DOA: 06/09/2024     3 DOS: the patient was seen and examined on 06/13/2024   Brief hospital course: Mr. Guaman was admitted to the hospital with the working diagnosis of chest pain.   83 y.o. 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 Recently started on radiation therapy.  Reported chest pain with radiation to the back.  On his initial physical examination his blood pressure was 166/59, HR 64, RR 18 and 02 saturation 92% Lungs with no wheezing or rales, heart with S1 and S2 present and regular, abdomen with no distention and no lower extremity edema. Residual right sided hemiparesis. Noted reproducible pain to chest wall palpation.   Na 139, K 3,9 Cl 105 bicarbonate 25 glucose 96 bun 20 cr 1,31  High sensitive troponin 504, 531 and 606  Wbc 7,0 hgb 12,2 plt 177  D dimer > 20   Chest radiograph with hyperinflation and increased lung markings at the bases. No infiltrates or effusions.  CT chest with no pulmonary embolism. Multiple pulmonary nodules, subcutaneous soft tissue nodules and left posterior fourth rib lytic expansile lesion, stable to prior.  Stable right thyroid  nodule.  Signs of pulmonary fibrosis and emphysema.  Left lower lobe subpleural consolidation/ mass versus round atelectasis stable to prior, but gradually enlarging from 2024.   EKG 62 bpm, normal axis, normal intervals, qtc 444, sinus rhythm with no significant ST segment or T wave changes.   Transferring to [facility] for possible LHC on 06/13/2024 after Eliquis  washout.  Patient was placed on IV heparin .   07/26 transfer to [facility]. Plan for cardiac catheterization on 07/28  07/28 plan for catheterization today.     Assessment and Plan: * Chest  pain NSTEMI Echocardiogram with worse inferior wall hypokinesis.   Plan to continue anticoagulation with IV heparin .  Continue atorvastatin  and metoprolol .  Plan for cardiac catheterization today   PAF (paroxysmal atrial fibrillation) (HCC) Continue amiodarone  for rate and rhythm control Anticoagulation with IV heparin  in preparation for cardiac catheterization.  Continue telemetry monitoring   Chronic kidney disease, stage 3a (HCC) Stable renal function with serum cr at 1,24 with K at 3,9 and serum bicarbonate at 20  Na 135  Continue close follow up renal function and electrolytes.   Chronic diastolic CHF (congestive heart failure) (HCC) No signs of acute decompensation  Continue with metoprolol  succinate and entresto .   Centrilobular emphysema (HCC) No signs of acute exacerbation   Metastatic adenocarcinoma [facility]) Patient on radiation therapy, Awaiting placement of Port A cath for palliative chemotherapy.   History of prostate cancer.   History of CVA (cerebrovascular accident) Continue blood pressure monitoring Continue statin Currently on IV heparin  for anticoagulation   Gastroesophageal reflux disease with esophagitis without hemorrhage Continue pantoprazole    Type 2 diabetes mellitus with hyperlipidemia (HCC) Continue insulin  sliding scale for glucose cover and monitoring  Continue statin therapy.       Subjective: Patient with no chest pain today, no dyspnea, he is anxious about going home.   Physical Exam: Vitals:   06/12/24 2033 06/12/24 2334 06/13/24 0424 06/13/24 0731  BP:  (!) 143/52 (!) 139/50 (!) 130/53  Pulse:   (!) 53 (!) 56  Resp:  18  18  Temp:  97.9 F (36.6 C) 97.8 F (36.6 C) 98  F (36.7 C)  TempSrc:  Oral Oral Oral  SpO2: 95% 95% 96% 95%  Weight:   85 kg   Height:       Neurology awake and alert ENT with mild pallor Cardiovascular with S1 and S2 present and regular with no gallops, or rubs Respiratory with no rales or wheezing,  no rhonchi  Abdomen with no distention  No lower extremity edema   Data Reviewed:    Family Communication: no family at the bedside   Disposition: Status is: Inpatient Remains inpatient appropriate because: pending cardiac catheterization   Planned Discharge Destination: Home    Author: Elidia Toribio Furnace, MD 06/13/2024 10:53 AM  For on call review [email or web address].

## 2024-06-13 NOTE — H&P (View-Only) (Signed)
 Rounding Note   Patient Name: Joshua Compton. Date of Encounter: 06/13/2024  [facility] Cardiologist: Alvan Carrier, MD   Subjective  Denies any SOB. Mild chest discomfort yesterday.   Scheduled Meds:  amiodarone   200 mg Oral Daily   atorvastatin   40 mg Oral Daily   budesonide -glycopyrrolate -formoterol   2 puff Inhalation BID   cholecalciferol   1,000 Units Oral Daily   diphenhydrAMINE   50 mg Oral Once   Or   diphenhydrAMINE   50 mg Intravenous Once   dorzolamide -timolol   1 drop Both Eyes Daily   insulin  aspart  0-5 Units Subcutaneous QHS   insulin  aspart  0-6 Units Subcutaneous TID WC   methylPREDNISolone  (SOLU-MEDROL ) injection  40 mg Intravenous Q4H   metoprolol  succinate  12.5 mg Oral Daily   oxyCODONE   10 mg Oral Q12H   pantoprazole   40 mg Oral Daily   rOPINIRole   1 mg Oral TID   sacubitril -valsartan   1 tablet Oral BID   sodium chloride  flush  3 mL Intravenous Q12H   sodium chloride  flush  3 mL Intravenous Q12H   Continuous Infusions:  sodium chloride  1 mL/kg/hr (06/13/24 0515)   heparin  1,300 Units/hr (06/12/24 2330)   PRN Meds: acetaminophen  **OR** acetaminophen , bisacodyl , ondansetron  **OR** ondansetron  (ZOFRAN ) IV, mouth rinse, oxyCODONE , polyethylene glycol, sodium chloride  flush, traZODone    Vital Signs  Vitals:   06/12/24 2033 06/12/24 2334 06/13/24 0424 06/13/24 0731  BP:  (!) 143/52 (!) 139/50 (!) 130/53  Pulse:   (!) 53 (!) 56  Resp:  18  18  Temp:  97.9 F (36.6 C) 97.8 F (36.6 C) 98 F (36.7 C)  TempSrc:  Oral Oral Oral  SpO2: 95% 95% 96% 95%  Weight:   85 kg   Height:        Intake/Output Summary (Last 24 hours) at 06/13/2024 0944 Last data filed at 06/12/2024 2200 Gross per 24 hour  Intake 314.94 ml  Output --  Net 314.94 ml      06/13/2024    4:24 AM 06/10/2024    8:12 PM 06/09/2024    5:50 AM  Last 3 Weights  Weight (lbs) 187 lb 8 oz 192 lb 0.3 oz 192 lb  Weight (kg) 85.049 kg 87.1 kg 87.091 kg       Telemetry NSR without significant ST-T wave changes - Personally Reviewed  ECG  NSR without ST-T wave changes - Personally Reviewed  Physical Exam  GEN: No acute distress.   Neck: No JVD Cardiac: RRR, no murmurs, rubs, or gallops.  Respiratory: Clear to auscultation bilaterally. GI: Soft, nontender, non-distended  MS: No edema; No deformity. Neuro:  Nonfocal  Psych: Normal affect   Labs High Sensitivity Troponin:   Recent Labs  Lab 06/05/24 1524 06/09/24 0641 06/09/24 0859 06/09/24 1417 06/10/24 0426  TROPONINIHS 70* 504* 541* 606* 675*     Chemistry Recent Labs  Lab 06/07/24 1135 06/09/24 0641 06/10/24 0426 06/12/24 0503  NA 139 139 139 135  K 4.2 3.9 4.5 3.9  CL 103 105 107 102  CO2 30 25 22  20*  GLUCOSE 106* 96 100* 90  BUN 26* 20 22 23   CREATININE 1.62* 1.31* 1.28* 1.24  CALCIUM  9.4 9.5 9.3 9.1  PROT 6.9 7.1  --   --   ALBUMIN 3.7 3.5 3.0*  --   AST 24 26  --   --   ALT 26 22  --   --   ALKPHOS 206* 262*  --   --   BILITOT 0.5  1.1  --   --   GFRNONAA 42* 54* 56* 58*  ANIONGAP 6 9 10 13     Lipids No results for input(s): CHOL, TRIG, HDL, LABVLDL, LDLCALC, CHOLHDL in the last 168 hours.  Hematology Recent Labs  Lab 06/09/24 0641 06/11/24 0955 06/12/24 0503  WBC 7.0 8.2 8.3  RBC 4.01* 3.77* 3.79*  HGB 12.2* 11.5* 11.6*  HCT 38.8* 35.3* 36.0*  MCV 96.8 93.6 95.0  MCH 30.4 30.5 30.6  MCHC 31.4 32.6 32.2  RDW 15.8* 15.4 15.4  PLT 177 165 187   Thyroid  No results for input(s): TSH, FREET4 in the last 168 hours.  BNPNo results for input(s): BNP, PROBNP in the last 168 hours.  DDimer  Recent Labs  Lab 06/09/24 0859  DDIMER >20.00*     Radiology  No results found.  Cardiac Studies  Echo 06/09/2024  1. Global hypokinesis slightly worse in inferior wall . Left ventricular  ejection fraction, by estimation, is 50 to 55%. The left ventricle has low  normal function. The left ventricle demonstrates global hypokinesis.  The  left ventricular internal cavity   size was mildly dilated. There is mild left ventricular hypertrophy. Left  ventricular diastolic parameters are consistent with Grade I diastolic  dysfunction (impaired relaxation).   2. Right ventricular systolic function is normal. The right ventricular  size is normal.   3. The mitral valve is abnormal. Mild mitral valve regurgitation. No  evidence of mitral stenosis.   4. The aortic valve was not well visualized. There is moderate  calcification of the aortic valve. There is moderate thickening of the  aortic valve. Aortic valve regurgitation is mild to moderate. Aortic valve  sclerosis/calcification is present, without  any evidence of aortic stenosis.   5. The inferior vena cava is normal in size with greater than 50%  respiratory variability, suggesting right atrial pressure of 3 mmHg.   Patient Profile   83 y.o. 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   Assessment & Plan   NSTEMI  - ASA, lipitor, and toprol  XL. Eliquis  held.   - initially plan for medical management due to stage IV cancer, however eventually decided to proceed with cath today given persistent chest pain and elevated troponin  - premedicated for contrast allergy.   - Risk and benefit of procedure explained to the patient who display clear understanding and agree to proceed.  Discussed with patient possible procedural risk include bleeding, vascular injury, renal injury, arrythmia, MI, stroke and loss of limb or life.  - post PCI, may consider transfer the patient back to [facility] so he can get palliative radiation therapy.    PAF  - maintaining NSR. Eliquis  held in anticipation of cath  Chronic combined systolic and diastolic CHF  - EF improved from 45-50% in Jan 2025 to 50-55% this  admission  - not on MRA or SGLT2 given history of AKI with Cr as high as 2  - continue toprol  XL  - continue entresto   Hypertension: continue entresto  and toprol  XL  Hyperlipidemia: atorvastatin   Metastatic CA: on palliative radiation therapy    For questions or updates, please contact [facility] Please consult [email or web address] for contact info under     Signed, Scot Ford, PA  06/13/2024, 9:44 AM

## 2024-06-13 NOTE — Progress Notes (Signed)
 Rounding Note   Patient Name: Joshua Compton. Date of Encounter: 06/13/2024  [facility] Cardiologist: Alvan Carrier, MD   Subjective  Denies any SOB. Mild chest discomfort yesterday.   Scheduled Meds:  amiodarone   200 mg Oral Daily   atorvastatin   40 mg Oral Daily   budesonide -glycopyrrolate -formoterol   2 puff Inhalation BID   cholecalciferol   1,000 Units Oral Daily   diphenhydrAMINE   50 mg Oral Once   Or   diphenhydrAMINE   50 mg Intravenous Once   dorzolamide -timolol   1 drop Both Eyes Daily   insulin  aspart  0-5 Units Subcutaneous QHS   insulin  aspart  0-6 Units Subcutaneous TID WC   methylPREDNISolone  (SOLU-MEDROL ) injection  40 mg Intravenous Q4H   metoprolol  succinate  12.5 mg Oral Daily   oxyCODONE   10 mg Oral Q12H   pantoprazole   40 mg Oral Daily   rOPINIRole   1 mg Oral TID   sacubitril -valsartan   1 tablet Oral BID   sodium chloride  flush  3 mL Intravenous Q12H   sodium chloride  flush  3 mL Intravenous Q12H   Continuous Infusions:  sodium chloride  1 mL/kg/hr (06/13/24 0515)   heparin  1,300 Units/hr (06/12/24 2330)   PRN Meds: acetaminophen  **OR** acetaminophen , bisacodyl , ondansetron  **OR** ondansetron  (ZOFRAN ) IV, mouth rinse, oxyCODONE , polyethylene glycol, sodium chloride  flush, traZODone    Vital Signs  Vitals:   06/12/24 2033 06/12/24 2334 06/13/24 0424 06/13/24 0731  BP:  (!) 143/52 (!) 139/50 (!) 130/53  Pulse:   (!) 53 (!) 56  Resp:  18  18  Temp:  97.9 F (36.6 C) 97.8 F (36.6 C) 98 F (36.7 C)  TempSrc:  Oral Oral Oral  SpO2: 95% 95% 96% 95%  Weight:   85 kg   Height:        Intake/Output Summary (Last 24 hours) at 06/13/2024 0944 Last data filed at 06/12/2024 2200 Gross per 24 hour  Intake 314.94 ml  Output --  Net 314.94 ml      06/13/2024    4:24 AM 06/10/2024    8:12 PM 06/09/2024    5:50 AM  Last 3 Weights  Weight (lbs) 187 lb 8 oz 192 lb 0.3 oz 192 lb  Weight (kg) 85.049 kg 87.1 kg 87.091 kg       Telemetry NSR without significant ST-T wave changes - Personally Reviewed  ECG  NSR without ST-T wave changes - Personally Reviewed  Physical Exam  GEN: No acute distress.   Neck: No JVD Cardiac: RRR, no murmurs, rubs, or gallops.  Respiratory: Clear to auscultation bilaterally. GI: Soft, nontender, non-distended  MS: No edema; No deformity. Neuro:  Nonfocal  Psych: Normal affect   Labs High Sensitivity Troponin:   Recent Labs  Lab 06/05/24 1524 06/09/24 0641 06/09/24 0859 06/09/24 1417 06/10/24 0426  TROPONINIHS 70* 504* 541* 606* 675*     Chemistry Recent Labs  Lab 06/07/24 1135 06/09/24 0641 06/10/24 0426 06/12/24 0503  NA 139 139 139 135  K 4.2 3.9 4.5 3.9  CL 103 105 107 102  CO2 30 25 22  20*  GLUCOSE 106* 96 100* 90  BUN 26* 20 22 23   CREATININE 1.62* 1.31* 1.28* 1.24  CALCIUM  9.4 9.5 9.3 9.1  PROT 6.9 7.1  --   --   ALBUMIN 3.7 3.5 3.0*  --   AST 24 26  --   --   ALT 26 22  --   --   ALKPHOS 206* 262*  --   --   BILITOT 0.5  1.1  --   --   GFRNONAA 42* 54* 56* 58*  ANIONGAP 6 9 10 13     Lipids No results for input(s): CHOL, TRIG, HDL, LABVLDL, LDLCALC, CHOLHDL in the last 168 hours.  Hematology Recent Labs  Lab 06/09/24 0641 06/11/24 0955 06/12/24 0503  WBC 7.0 8.2 8.3  RBC 4.01* 3.77* 3.79*  HGB 12.2* 11.5* 11.6*  HCT 38.8* 35.3* 36.0*  MCV 96.8 93.6 95.0  MCH 30.4 30.5 30.6  MCHC 31.4 32.6 32.2  RDW 15.8* 15.4 15.4  PLT 177 165 187   Thyroid  No results for input(s): TSH, FREET4 in the last 168 hours.  BNPNo results for input(s): BNP, PROBNP in the last 168 hours.  DDimer  Recent Labs  Lab 06/09/24 0859  DDIMER >20.00*     Radiology  No results found.  Cardiac Studies  Echo 06/09/2024  1. Global hypokinesis slightly worse in inferior wall . Left ventricular  ejection fraction, by estimation, is 50 to 55%. The left ventricle has low  normal function. The left ventricle demonstrates global hypokinesis.  The  left ventricular internal cavity   size was mildly dilated. There is mild left ventricular hypertrophy. Left  ventricular diastolic parameters are consistent with Grade I diastolic  dysfunction (impaired relaxation).   2. Right ventricular systolic function is normal. The right ventricular  size is normal.   3. The mitral valve is abnormal. Mild mitral valve regurgitation. No  evidence of mitral stenosis.   4. The aortic valve was not well visualized. There is moderate  calcification of the aortic valve. There is moderate thickening of the  aortic valve. Aortic valve regurgitation is mild to moderate. Aortic valve  sclerosis/calcification is present, without  any evidence of aortic stenosis.   5. The inferior vena cava is normal in size with greater than 50%  respiratory variability, suggesting right atrial pressure of 3 mmHg.   Patient Profile   83 y.o. 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   Assessment & Plan   NSTEMI  - ASA, lipitor, and toprol  XL. Eliquis  held.   - initially plan for medical management due to stage IV cancer, however eventually decided to proceed with cath today given persistent chest pain and elevated troponin  - premedicated for contrast allergy.   - Risk and benefit of procedure explained to the patient who display clear understanding and agree to proceed.  Discussed with patient possible procedural risk include bleeding, vascular injury, renal injury, arrythmia, MI, stroke and loss of limb or life.  - post PCI, may consider transfer the patient back to [facility] so he can get palliative radiation therapy.    PAF  - maintaining NSR. Eliquis  held in anticipation of cath  Chronic combined systolic and diastolic CHF  - EF improved from 45-50% in Jan 2025 to 50-55% this  admission  - not on MRA or SGLT2 given history of AKI with Cr as high as 2  - continue toprol  XL  - continue entresto   Hypertension: continue entresto  and toprol  XL  Hyperlipidemia: atorvastatin   Metastatic CA: on palliative radiation therapy    For questions or updates, please contact [facility] Please consult [email or web address] for contact info under     Signed, Scot Ford, PA  06/13/2024, 9:44 AM

## 2024-06-13 NOTE — Progress Notes (Addendum)
 Called for a confused patient who removed his TR band.  Manual pressure being held as I arrived.  Appears to be what could be estimated as 1 unit of blood loss.  Blood pressure cuff applied, and new tr band applied to right radial artery. TR band adjusted to 11cc for a reverse barbeau of C.  No hematoma noted.    Band applied at 22:48:00

## 2024-06-13 NOTE — Interval H&P Note (Signed)
 History and Physical Interval Note:  06/13/2024 3:09 PM  Joshua Compton.  has presented today for surgery, with the diagnosis of nstemi.  The various methods of treatment have been discussed with the patient and family. After consideration of risks, benefits and other options for treatment, the patient has consented to  Procedure(s): LEFT HEART CATH AND CORONARY ANGIOGRAPHY (N/A)  PERCUTANEOUS CORONARY INTERVENTION   as a surgical intervention.   The patient's history has been reviewed, patient examined, no change in status, stable for surgery.  I have reviewed the patient's chart and labs.  Questions were answered to the patient's satisfaction.    Cath Lab Visit (complete for each Cath Lab visit)  Clinical Evaluation Leading to the Procedure:   ACS: Yes.    Non-ACS:    Anginal Classification: CCS IV  Anti-ischemic medical therapy: Minimal Therapy (1 class of medications)  Non-Invasive Test Results: No non-invasive testing performed  Prior CABG: No previous CABG    [name]

## 2024-06-14 ENCOUNTER — Ambulatory Visit
Admission: RE | Admit: 2024-06-14 | Discharge: 2024-06-14 | Discharge status: Home / Self Care | Source: Ambulatory Visit | Attending: Radiation Oncology

## 2024-06-14 ENCOUNTER — Ambulatory Visit
Admission: RE | Admit: 2024-06-14 | Discharge: 2024-06-14 | Disposition: A | Discharge status: Home / Self Care | Source: Ambulatory Visit | Attending: Radiation Oncology | Admitting: Radiation Oncology

## 2024-06-14 ENCOUNTER — Other Ambulatory Visit (HOSPITAL_COMMUNITY): Payer: Self-pay

## 2024-06-14 ENCOUNTER — Other Ambulatory Visit

## 2024-06-14 ENCOUNTER — Encounter (HOSPITAL_COMMUNITY): Admission: EM | Payer: Self-pay | Source: Home / Self Care | Attending: Family Medicine

## 2024-06-14 ENCOUNTER — Encounter: Payer: Self-pay | Admitting: Hematology and Oncology

## 2024-06-14 ENCOUNTER — Other Ambulatory Visit: Payer: Self-pay

## 2024-06-14 DIAGNOSIS — Z510 Encounter for antineoplastic radiation therapy: Secondary | ICD-10-CM | POA: Diagnosis not present

## 2024-06-14 DIAGNOSIS — C61 Malignant neoplasm of prostate: Secondary | ICD-10-CM | POA: Diagnosis not present

## 2024-06-14 DIAGNOSIS — Z66 Do not resuscitate: Secondary | ICD-10-CM | POA: Diagnosis not present

## 2024-06-14 DIAGNOSIS — N1831 Chronic kidney disease, stage 3a: Secondary | ICD-10-CM | POA: Diagnosis not present

## 2024-06-14 DIAGNOSIS — C7951 Secondary malignant neoplasm of bone: Secondary | ICD-10-CM | POA: Diagnosis not present

## 2024-06-14 DIAGNOSIS — I214 Non-ST elevation (NSTEMI) myocardial infarction: Secondary | ICD-10-CM | POA: Diagnosis not present

## 2024-06-14 DIAGNOSIS — I5032 Chronic diastolic (congestive) heart failure: Secondary | ICD-10-CM | POA: Diagnosis not present

## 2024-06-14 DIAGNOSIS — Z515 Encounter for palliative care: Secondary | ICD-10-CM | POA: Diagnosis not present

## 2024-06-14 DIAGNOSIS — J432 Centrilobular emphysema: Secondary | ICD-10-CM | POA: Diagnosis not present

## 2024-06-14 DIAGNOSIS — I480 Paroxysmal atrial fibrillation: Secondary | ICD-10-CM | POA: Diagnosis not present

## 2024-06-14 LAB — BASIC METABOLIC PANEL WITH GFR
Anion gap: 11 (ref 5–15)
BUN: 23 mg/dL (ref 8–23)
CO2: 20 mmol/L — ABNORMAL LOW (ref 22–32)
Calcium: 9.1 mg/dL (ref 8.9–10.3)
Chloride: 109 mmol/L (ref 98–111)
Creatinine, Ser: 1.25 mg/dL — ABNORMAL HIGH (ref 0.61–1.24)
GFR, Estimated: 57 mL/min — ABNORMAL LOW (ref >60)
Glucose, Bld: 103 mg/dL — ABNORMAL HIGH (ref 70–99)
Potassium: 4 mmol/L (ref 3.5–5.1)
Sodium: 140 mmol/L (ref 135–145)

## 2024-06-14 LAB — RAD ONC ARIA SESSION SUMMARY
Course Elapsed Days: 15
Plan Fractions Treated to Date: 9
Plan Prescribed Dose Per Fraction: 3 Gy
Plan Total Fractions Prescribed: 10
Plan Total Prescribed Dose: 30 Gy
Reference Point Dosage Given to Date: 27 Gy
Reference Point Session Dosage Given: 3 Gy
Session Number: 9

## 2024-06-14 LAB — CBC
HCT: 36.7 % — ABNORMAL LOW (ref 39.0–52.0)
Hemoglobin: 12.1 g/dL — ABNORMAL LOW (ref 13.0–17.0)
MCH: 30.5 pg (ref 26.0–34.0)
MCHC: 33 g/dL (ref 30.0–36.0)
MCV: 92.4 fL (ref 80.0–100.0)
Platelets: 197 K/uL (ref 150–400)
RBC: 3.97 MIL/uL — ABNORMAL LOW (ref 4.22–5.81)
RDW: 15.4 % (ref 11.5–15.5)
WBC: 6.9 K/uL (ref 4.0–10.5)
nRBC: 0.3 % — ABNORMAL HIGH (ref 0.0–0.2)

## 2024-06-14 LAB — LIPID PANEL
Cholesterol: 165 mg/dL (ref 0–200)
HDL: 62 mg/dL (ref >40)
LDL Cholesterol: 89 mg/dL (ref 0–99)
Total CHOL/HDL Ratio: 2.7 ratio
Triglycerides: 71 mg/dL (ref <150)
VLDL: 14 mg/dL (ref 0–40)

## 2024-06-14 LAB — HEMOGLOBIN AND HEMATOCRIT, BLOOD
HCT: 39 % (ref 39.0–52.0)
Hemoglobin: 12.6 g/dL — ABNORMAL LOW (ref 13.0–17.0)

## 2024-06-14 LAB — GLUCOSE, CAPILLARY: Glucose-Capillary: 124 mg/dL — ABNORMAL HIGH (ref 70–99)

## 2024-06-14 LAB — APTT: aPTT: 29 s (ref 24–36)

## 2024-06-14 SURGERY — CORONARY STENT INTERVENTION
Anesthesia: LOCAL

## 2024-06-14 MED ORDER — APIXABAN 5 MG PO TABS
5.0000 mg | ORAL_TABLET | Freq: Two times a day (BID) | ORAL | 0 refills | Status: DC
  Filled 2024-06-14: qty 60, 30d supply, fill #0

## 2024-06-14 MED ORDER — CLOPIDOGREL BISULFATE 75 MG PO TABS
75.0000 mg | ORAL_TABLET | Freq: Every day | ORAL | 0 refills | Status: AC
  Filled 2024-06-14: qty 30, 30d supply, fill #0

## 2024-06-14 MED ORDER — ASPIRIN 81 MG PO CHEW
81.0000 mg | CHEWABLE_TABLET | Freq: Every day | ORAL | 0 refills | Status: DC
  Filled 2024-06-14: qty 30, 30d supply, fill #0

## 2024-06-14 NOTE — Discharge Summary (Signed)
 Physician Discharge Summary   Patient: Joshua Compton. MRN: [id number] DOB: 1941-02-12  Admit date:     06/09/2024  Discharge date: 06/14/24  Discharge Physician: Elidia Sieving Anikka Marsan   PCP: [name], MD   Recommendations at discharge:    Patient was been placed on aspirin  and clopidogrel  post PCI to take for 12 months.  Renal function stable, transitioned back to full dose apixaban  for atrial fibrillation. Follow up with Dr Katrinka in 7 to 10 days as outpatient Follow up with Cardiology as scheduled Follow up with Oncology and Radiation oncology as outpatient.   I spoke with patient's daughter over the phone, we talked in detail about patient's condition, plan of care and prognosis and all questions were addressed.  Discharge Diagnoses: Principal Problem:   Chest pain Active Problems:   PAF (paroxysmal atrial fibrillation) (HCC)   Chronic kidney disease, stage 3a (HCC)   Chronic diastolic CHF (congestive heart failure) (HCC)   Centrilobular emphysema (HCC)   Metastatic adenocarcinoma (HCC)   History of CVA (cerebrovascular accident)   Gastroesophageal reflux disease with esophagitis without hemorrhage   Type 2 diabetes mellitus with hyperlipidemia (HCC)  Resolved Problems:   * No resolved hospital problems. [facility] Course: Joshua Compton was admitted to the hospital with the working diagnosis of chest pain.   83 y.o. 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 Recently started on radiation therapy.  Reported chest pain with radiation to the back.  On his initial physical examination his blood pressure was 166/59, HR 64, RR 18 and 02 saturation 92% Lungs with no wheezing or rales, heart with S1 and S2 present and regular, abdomen with no distention and no lower extremity  edema. Residual right sided hemiparesis. Noted reproducible pain to chest wall palpation.   Na 139, K 3,9 Cl 105 bicarbonate 25 glucose 96 bun 20 cr 1,31  High sensitive troponin 504, 531 and 606  Wbc 7,0 hgb 12,2 plt 177  D dimer > 20   Chest radiograph with hyperinflation and increased lung markings at the bases. No infiltrates or effusions.  CT chest with no pulmonary embolism. Multiple pulmonary nodules, subcutaneous soft tissue nodules and left posterior fourth rib lytic expansile lesion, stable to prior.  Stable right thyroid  nodule.  Signs of pulmonary fibrosis and emphysema.  Left lower lobe subpleural consolidation/ mass versus round atelectasis stable to prior, but gradually enlarging from 2024.   EKG 62 bpm, normal axis, normal intervals, qtc 444, sinus rhythm with no significant ST segment or T wave changes.   Transferring to [facility] for possible LHC on 06/13/2024 after Eliquis  washout.  Patient was placed on IV heparin .   07/26 transfer to [facility]. Plan for cardiac catheterization on 07/28  07/28 cardiac catheterization with severe two vessel disease, had PTCA and DES PCI to mid and distal LCx lesions.  07/29 no chest pain, plan to continue dual antiplatelet therapy and follow up as outpatient.     Assessment and Plan: * NSTEMI (non-ST elevated myocardial infarction) (HCC) Echocardiogram with worse inferior wall hypokinesis.   Patient was placed on medical therapy with atorvastatin  and metoprolol .  Anticoagulation with IV heparin   07/28 cardiac catheterization with sever 2 vessel disease, 80% calcified/ fibrotic stenosis in the mid LCx just after OM1 followed by 90% focal heavily calcified lesion just after OM2 into the  AV groove.  Focal 70% LAD between 1st and 2nd diagonal dig branches.   He underwent successful complex OTC guided shockwave lithotripsy PTCA and DES PCI of mid and distal LCx lesions, treated with DES.   For LAD lesion to consider staged PCI at a later date  to allow recovery from prolonged procedure.   Recommendations to resume anticoagulation with apixaban .  Dual antiplatelet therapy with aspirin  and clopidogrel  for 12 months.  Continue metoprolol  and statin therapy.    PAF (paroxysmal atrial fibrillation) (HCC) Continue amiodarone  for rate and rhythm control Anticoagulation with apixaban .   Chronic kidney disease, stage 3a (HCC) At the time of discharge his serum cr is 1.25 with K at 4.0 and serum bicarbonate at 20  Na 140  Follow up renal function as outpatient.   Chronic diastolic CHF (congestive heart failure) (HCC) Echocardiogram with hypokinesis slightly worse in inferior wall, LV EF 50 to 55%. Global hypokinesis, mild LV cavity dilatation, mild LVH, RV systolic function preserved, mild MR, mild to moderate aortic regurgitation, normal size LA and RA.   No signs of acute decompensation  Continue with metoprolol  succinate and entresto .   Centrilobular emphysema (HCC) No signs of acute exacerbation   Metastatic adenocarcinoma [facility]) Patient on radiation therapy, Awaiting placement of Port A cath for palliative chemotherapy.   History of prostate cancer.   History of CVA (cerebrovascular accident) Continue blood pressure control.  Continue statin and for anticoagulation apixaban .   Gastroesophageal reflux disease with esophagitis without hemorrhage Continue pantoprazole    Type 2 diabetes mellitus with hyperlipidemia (HCC) Patient was placed on insulin  sliding scale for glucose cover and monitoring.  Glucose remained well controlled.  Fasting discharge glucose is 103 mg/dl.  Continue statin therapy.       Consultants: Cardiology  Procedures performed: cardiac catheterization and PCI   Disposition: Home Diet recommendation:  Cardiac diet DISCHARGE MEDICATION: Allergies as of 06/14/2024       Reactions   Iodinated Contrast Media Anaphylaxis   Tongue swelled up when he had MRI in [facility] Intubated for airway  protection after oral and throat swelling   Pravastatin  Other (See Comments)   Muscle aches        Medication List     TAKE these medications    amiodarone  200 MG tablet Commonly known as: PACERONE  Take 1 tablet (200 mg total) by mouth daily.   apixaban  5 MG Tabs tablet Commonly known as: ELIQUIS  Take 1 tablet (5 mg total) by mouth 2 (two) times daily. What changed:  medication strength how much to take   aspirin  81 MG chewable tablet Chew 1 tablet (81 mg total) by mouth daily.   atorvastatin  40 MG tablet Commonly known as: LIPITOR Take 1 tablet (40 mg total) by mouth daily.   cholecalciferol  25 MCG (1000 UNIT) tablet Commonly known as: VITAMIN D3 TAKE 1 TABLET BY MOUTH EVERY DAY   clopidogrel  75 MG tablet Commonly known as: PLAVIX  Take 1 tablet (75 mg total) by mouth daily with breakfast.   dorzolamide -timolol  2-0.5 % ophthalmic solution Commonly known as: COSOPT  Place 1 drop into both eyes daily.   Entresto  24-26 MG Generic drug: sacubitril -valsartan  Take 1 tablet by mouth 2 (two) times daily.   metoprolol  succinate 25 MG 24 hr tablet Commonly known as: TOPROL -XL TAKE 0.5 TABLETS (12.5 MG TOTAL) BY MOUTH DAILY. FOR BLOOD PRESSURE CONTROL   Oxycodone  HCl 10 MG Tabs Take 1 tablet (10 mg total) by mouth every 6 (six) hours as needed for up to 120  doses.   pantoprazole  40 MG tablet Commonly known as: PROTONIX  Take 1 tablet (40 mg total) by mouth daily. For heartburn   rOPINIRole  1 MG tablet Commonly known as: REQUIP  Take 1 tablet (1 mg total) by mouth 3 (three) times daily. Plus an extra tablet at bedtime to total four a day   Trelegy Ellipta  100-62.5-25 MCG/ACT Aepb Generic drug: Fluticasone -Umeclidin-Vilant Inhale 1 puff into the lungs daily.   Xtampza  ER 9 MG C12a Generic drug: oxyCODONE  ER Take 9 mg by mouth every 12 (twelve) hours as needed.        Discharge Exam: Filed Weights   06/09/24 0550 06/10/24 2012 06/13/24 0424  Weight: 87.1 kg  87.1 kg 85 kg   BP 128/81 (BP Location: Left Arm)   Pulse 61   Temp 98.3 F (36.8 C) (Oral)   Resp 19   Ht 6' (1.829 m)   Wt 85 kg   SpO2 95%   BMI 25.43 kg/m   Neurology awake and alert ENT with mild pallor Cardiovascular with S1 and S2 present and regular with no gallops, rubs or murmurs Respiratory with no rales or wheezing Abdomen with no distention  No lower extremity edema   Condition at discharge: stable  The results of significant diagnostics from this hospitalization (including imaging, microbiology, ancillary and laboratory) are listed below for reference.   Imaging Studies: CARDIAC CATHETERIZATION Result Date: 06/13/2024 Images from the original result were not included.   Mid Cx to Dist Cx lesion is 80% stenosed. Dist Cx lesion is 90% stenosed.  TIMI-3 flow   OCT guided IV L performed on both lesions using a 3.5 mm x 12 mm IV L balloon   A drug-eluting stent was successfully placed covering both lesions, using a STENT ONYX FRONTIER [id number].  The proximal 30 mm stent was postdilated to 3.75 mm with the stent itself deployed to 3.6 mm distally.  Post intervention, there is a 0% residual stenosis across both lesions; TIMI-3 flow maintained.   Mid LAD lesion is 70% stenosed.   RPDA lesion is 50% stenosed.   Dist Cx lesion is 90% stenosed.   Post intervention, there is a 0% residual stenosis.   Post intervention, there is a 0% residual stenosis.   LV end diastolic pressure is normal.   There is no aortic valve stenosis.   In the absence of any other complications or medical issues, we expect the patient to be ready for discharge from an interventional cardiology perspective on 06/14/2024.   Recommend to resume Apixaban , at currently prescribed dose and frequency on 06/14/2024.   Recommend concurrent antiplatelet therapy of Aspirin  81 mg for 1 month and Clopidogrel  75mg  daily for 12 months . Diagnostic Dominance: Co-dominant     Intervention POST-OPERATIVE DIAGNOSIS:  Codominant system  Severe two-vessel disease: 80% calcified/fibrotic stenosis in the mid LCx just after OM1 followed by a 90% focal heavily calcified lesion just after OM 2 going into the AV groove. Successful complex OCT guided shockwave lithotripsy PTCA and DES PCI of mid and distal LCx lesions treated with Onyx frontier DES 3.5 mm x 38 mm postdilated and taper fashion from 3.75 to 3.6 mm reducing lesion to 0% with TIMI-3 flow maintained. Focal 70% LAD between 1st & 2nd Diag branches Consider staged PCI at a later date to allow recovery from prolonged procedure. Normal codominant RCA with borderline 50% stenosis in the mid PDA. Normal LVEDP. PLAN OF CARE: Return to nursing unit for ongoing care post PCI; recommend aspirin  and Plavix   x 1 month along with restarting DOAC in the morning => With long stent would try to continue Plavix  DOAC to complete 1 year given ACS presentation but stop aspirin  after 1 month; Recommend to resume Apixaban , at currently prescribed dose and frequency on 06/14/2024. Recommend concurrent antiplatelet therapy of Aspirin  81 mg for 1 month and Clopidogrel  75mg  daily for 12 months . For the remaining LAD lesion, discussed the possibility of medical management versus staged PCI next week to allow time for full recovery.   In the absence of any other complications or medical issues, we expect the patient to be ready for discharge from an interventional cardiology perspective on 06/14/2024. [name], MD  ECHOCARDIOGRAM COMPLETE Result Date: 06/09/2024    ECHOCARDIOGRAM REPORT   Patient Name:   Joshua Compton. Date of Exam: 06/09/2024 Medical Rec #:  [id number]             Height:       72.0 in Accession #:    [id number]            Weight:       192.0 lb Date of Birth:  1941/08/09             BSA:          2.094 m Patient Age:    83 years              BP:           149/62 mmHg Patient Gender: M                     HR:           68 bpm. Exam Location:  [facility] Procedure: 2D Echo, Cardiac Doppler and  Color Doppler (Both Spectral and Color            Flow Doppler were utilized during procedure). Indications:    Chest Pain R07.9  History:        Patient has prior history of Echocardiogram examinations, most                 recent 12/16/2023. CHF, Stroke, Arrythmias:Atrial Fibrillation;                 Risk Factors:Hypertension and Dyslipidemia.  Sonographer:    Aida Pizza RCS Referring Phys: [phone] [facility]  Sonographer Comments: Suboptimal parasternal window. IMPRESSIONS  1. Global hypokinesis slightly worse in inferior wall . Left ventricular ejection fraction, by estimation, is 50 to 55%. The left ventricle has low normal function. The left ventricle demonstrates global hypokinesis. The left ventricular internal cavity  size was mildly dilated. There is mild left ventricular hypertrophy. Left ventricular diastolic parameters are consistent with Grade I diastolic dysfunction (impaired relaxation).  2. Right ventricular systolic function is normal. The right ventricular size is normal.  3. The mitral valve is abnormal. Mild mitral valve regurgitation. No evidence of mitral stenosis.  4. The aortic valve was not well visualized. There is moderate calcification of the aortic valve. There is moderate thickening of the aortic valve. Aortic valve regurgitation is mild to moderate. Aortic valve sclerosis/calcification is present, without any evidence of aortic stenosis.  5. The inferior vena cava is normal in size with greater than 50% respiratory variability, suggesting right atrial pressure of 3 mmHg. FINDINGS  Left Ventricle: Global hypokinesis slightly worse in inferior wall. Left ventricular ejection fraction, by estimation, is 50 to 55%. The left ventricle has low normal function. The left  ventricle demonstrates global hypokinesis. Strain was performed and  the global longitudinal strain is indeterminate. The left ventricular internal cavity size was mildly dilated. There is mild left ventricular  hypertrophy. Left ventricular diastolic parameters are consistent with Grade I diastolic dysfunction (impaired relaxation). Right Ventricle: The right ventricular size is normal. No increase in right ventricular wall thickness. Right ventricular systolic function is normal. Left Atrium: Left atrial size was normal in size. Right Atrium: Right atrial size was normal in size. Pericardium: There is no evidence of pericardial effusion. Mitral Valve: The mitral valve is abnormal. There is mild thickening of the mitral valve leaflet(s). There is mild calcification of the mitral valve leaflet(s). Mild mitral valve regurgitation. No evidence of mitral valve stenosis. Tricuspid Valve: The tricuspid valve is normal in structure. Tricuspid valve regurgitation is not demonstrated. No evidence of tricuspid stenosis. Aortic Valve: The aortic valve was not well visualized. There is moderate calcification of the aortic valve. There is moderate thickening of the aortic valve. Aortic valve regurgitation is mild to moderate. Aortic valve sclerosis/calcification is present, without any evidence of aortic stenosis. Aortic valve mean gradient measures 9.0 mmHg. Aortic valve peak gradient measures 16.2 mmHg. Aortic valve area, by VTI measures 2.26 cm. Pulmonic Valve: The pulmonic valve was normal in structure. Pulmonic valve regurgitation is not visualized. No evidence of pulmonic stenosis. Aorta: The aortic root is normal in size and structure. Venous: The inferior vena cava is normal in size with greater than 50% respiratory variability, suggesting right atrial pressure of 3 mmHg. IAS/Shunts: No atrial level shunt detected by color flow Doppler. Additional Comments: 3D was performed not requiring image post processing on an independent workstation and was indeterminate.  LEFT VENTRICLE PLAX 2D LVIDd:         5.70 cm   Diastology LVIDs:         4.20 cm   LV e' medial:    8.92 cm/s LV PW:         1.00 cm   LV E/e' medial:  5.8 LV IVS:         1.20 cm   LV e' lateral:   13.20 cm/s LVOT diam:     2.30 cm   LV E/e' lateral: 3.9 LV SV:         104 LV SV Index:   50 LVOT Area:     4.15 cm  RIGHT VENTRICLE RV S prime:     17.30 cm/s TAPSE (M-mode): 3.0 cm LEFT ATRIUM             Index        RIGHT ATRIUM           Index LA diam:        3.60 cm 1.72 cm/m   RA Area:     16.90 cm LA Vol (A2C):   80.4 ml 38.40 ml/m  RA Volume:   39.30 ml  18.77 ml/m LA Vol (A4C):   46.5 ml 22.21 ml/m LA Biplane Vol: 64.2 ml 30.66 ml/m  AORTIC VALVE AV Area (Vmax):    2.48 cm AV Area (Vmean):   2.21 cm AV Area (VTI):     2.26 cm AV Vmax:           201.00 cm/s AV Vmean:          142.000 cm/s AV VTI:            0.462 m AV Peak Grad:      16.2 mmHg AV Mean Grad:  9.0 mmHg LVOT Vmax:         120.00 cm/s LVOT Vmean:        75.400 cm/s LVOT VTI:          0.252 m LVOT/AV VTI ratio: 0.54  AORTA Ao Root diam: 4.00 cm MITRAL VALVE MV Area (PHT): 1.71 cm    SHUNTS MV Decel Time: 444 msec    Systemic VTI:  0.25 m MV E velocity: 51.50 cm/s  Systemic Diam: 2.30 cm MV A velocity: 84.20 cm/s MV E/A ratio:  0.61 Maude Emmer MD Electronically signed by Maude Emmer MD Signature Date/Time: 06/09/2024/3:19:25 PM    Final    CT Angio Chest Pulmonary Embolism (PE) W or WO Contrast Result Date: 06/09/2024 CLINICAL DATA:  Pulmonary embolism suspected, high probability history of prostate cancer EXAM: CT ANGIOGRAPHY CHEST WITH CONTRAST TECHNIQUE: Multidetector CT imaging of the chest was performed using the standard protocol during bolus administration of intravenous contrast. Multiplanar CT image reconstructions and MIPs were obtained to evaluate the vascular anatomy. RADIATION DOSE REDUCTION: This exam was performed according to the departmental dose-optimization program which includes automated exposure control, adjustment of the mA and/or kV according to patient size and/or use of iterative reconstruction technique. CONTRAST:  75mL OMNIPAQUE  IOHEXOL  350 MG/ML SOLN COMPARISON:   Multiple priors dated back to September 05, 2023. FINDINGS: Cardiovascular: The heart size is normal. Atherosclerotic calcifications of coronary arteries. No pericardial fluid. Mediastinum/Nodes: Right thyroid  lobe nodule measuring 3.6 cm. No enlarged mediastinal, hilar, or axillary lymph nodes. Trachea, and esophagus demonstrate no significant findings. Lungs/Pleura: Severe upper lobe predominant centrilobular emphysematous changes. Bilateral lower lobe fibrotic changes with associated architectural distortion, the paraseptal emphysematous changes, bronchiectasis and bronchiolectasis. Bronchial and bronchiolar wall thickening. Left lower lobe subpleural consolidation/mass measuring 3.6 x 2.2 cm, stable to prior however gradually enlarging to September 05, 2023 which measured 1 x 1.4 cm. Multiple pulmonary nodules are stable to prior as follows: Right upper lobe ground-glass nodule in subpleural measuring 4 mm (4/66). Multiple perifissural nodules along the right minor fissure (4/73). These nodules are stable to prior. No new nodule.  No pleural effusion. Upper Abdomen: Multiple simple cortical cysts of the left kidney. Small hiatal hernia. Thickening of bilateral multiple subcutaneous adrenal gland query small nodules measuring 1 cm. Musculoskeletal: Lytic expansile osseous metastasis involving the posterior left fourth rib, stable to prior. Multiple soft tissue nodules in bilateral anterior chest wall for example image 4/100, 54. Additional small subcentimeter nodule in left breast region (4/60. Mild gynecomastia. Review of the MIP images confirms the above findings. IMPRESSION: No evidence of pulmonary embolus. Multiple pulmonary nodules, subcutaneous soft tissue nodules and left posterior fourth rib lytic expansile lesion, stable to prior. Follow-up according to oncologic protocols. Stable right thyroid  nodule. 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 Left lower lobe  subpleural consolidation/mass versus round atelectasis stable to prior, however gradually enlarging to September 05, 2023. Malignancy cannot be excluded. Recommend PET-CT for further assessment. Electronically Signed   By: Megan  Zare M.D.   On: 06/09/2024 12:51   DG Chest Port 1 View Result Date: 06/09/2024 CLINICAL DATA:  Chest pains radiating posteriorly. EXAM: PORTABLE CHEST 1 VIEW COMPARISON:  CTA chest 06/05/2024 FINDINGS: Emphysematous and chronic changes of the lungs. Focal left lower lobe opacity on the prior study is not seen today. Heart size and vascular pattern are normal. Mediastinum is stable with aortic tortuosity and atherosclerosis. The bones are demineralized. IMPRESSION: 1. Emphysematous and chronic changes of the lungs.  No acute chest findings. 2. Aortic atherosclerosis. Electronically Signed   By: Francis Quam M.D.   On: 06/09/2024 07:08   CT Angio Chest PE W and/or Wo Contrast Result Date: 06/05/2024 EXAM: CTA of the Chest with contrast for PE 06/05/2024 07:47:26 PM TECHNIQUE: CTA of the chest was performed after the administration of intravenous contrast. Multiplanar reformatted images are provided for review. MIP images are provided for review. Automated exposure control, iterative reconstruction, and/or weight based adjustment of the mA/kV was utilized to reduce the radiation dose to as low as reasonably achievable. COMPARISON: PET/CT dated 04/29/2024. CLINICAL HISTORY: Pulmonary embolism (PE) suspected, low to intermediate prob, positive D-dimer. FINDINGS: PULMONARY ARTERIES: Pulmonary arteries are adequately opacified for evaluation. No evidence of pulmonary embolism. MEDIASTINUM: 3.6 cm right thyroid  mass, corresponding to the patient's biopsy proven thyroid  versus metastatic malignancy. Moderate 3-vessel coronary atherosclerosis. LYMPH NODES: No suspicious mediastinal lymphadenopathy on CT. LUNGS AND PLEURA: Moderate centrilobular and paraseptal emphysematous changes, upper lung  predominant. Mild patchy posterior lower lobe opacities, likely atelectasis. No pneumothorax. UPPER ABDOMEN: Simple left renal cysts measuring up to 4.9 cm, benign (Bosniak 1). No follow up is recommended. SOFT TISSUES AND BONES: Lytic metastasis involving the left posterior fourth rib, without pathologic fracture. Mild degenerative changes of the visualized thoracolumbar spine. AORTA: Although not tailored for evaluation of the thoracic aorta, there is no evidence of thoracic aortic aneurysm or dissection. Thoracic aortic atherosclerosis. IMPRESSION: 1. No evidence of pulmonary embolism. 2. 3.6 cm right thyroid  mass, corresponding to the patient's biopsy proven primary vs metastatic malignancy. 3. Lytic metastasis involving the left posterior fourth rib, without pathologic fracture. Electronically signed by: Pinkie Pebbles MD 06/05/2024 08:02 PM EDT RP Workstation: [id number]   DG Chest 2 View Result Date: 06/05/2024 CLINICAL DATA:  Chest pain EXAM: CHEST - 2 VIEW COMPARISON:  X-ray 04/16/2024. PET-CT 04/29/2024 FINDINGS: Hyperinflation. Chronic lung changes. No consolidation, pneumothorax or effusion. No edema. Normal cardiopericardial silhouette. Slight increased density to the extreme upper mediastinum. Please correlate for known history of enlarged thyroid  gland. There is a nodular density along the lower left hemithorax along the anterior aspect of the left fifth rib. IMPRESSION: Hyperinflation with chronic changes. Asymmetric density in the lower left thorax. Based on the prior PET-CT this could be an area of nodular opacity of the lung. Recommend follow-up. Electronically Signed   By: Ranell Bring M.D.   On: 06/05/2024 14:16    Microbiology: Results for orders placed or performed during the hospital encounter of 09/05/23  Resp panel by RT-PCR (RSV, Flu A&B, Covid) Anterior Nasal Swab     Status: None   Collection Time: 09/05/23  8:25 AM   Specimen: Anterior Nasal Swab  Result Value Ref Range  Status   SARS Coronavirus 2 by RT PCR NEGATIVE NEGATIVE Final    Comment: (NOTE) SARS-CoV-2 target nucleic acids are NOT DETECTED.  The SARS-CoV-2 RNA is generally detectable in upper respiratory specimens during the acute phase of infection. The lowest concentration of SARS-CoV-2 viral copies this assay can detect is 138 copies/mL. A negative result does not preclude SARS-Cov-2 infection and should not be used as the sole basis for treatment or other patient management decisions. A negative result may occur with  improper specimen collection/handling, submission of specimen other than nasopharyngeal swab, presence of viral mutation(s) within the areas targeted by this assay, and inadequate number of viral copies(<138 copies/mL). A negative result must be combined with clinical observations, patient history, and epidemiological information. The expected result is Negative.  Fact Sheet for Patients:  BloggerCourse.com  Fact Sheet for Healthcare Providers:  SeriousBroker.it  This test is no t yet approved or cleared by the [place]  FDA and  has been authorized for detection and/or diagnosis of SARS-CoV-2 by FDA under an Emergency Use Authorization (EUA). This EUA will remain  in effect (meaning this test can be used) for the duration of the COVID-19 declaration under Section 564(b)(1) of the Act, 21 U.S.C.section 360bbb-3(b)(1), unless the authorization is terminated  or revoked sooner.       Influenza A by PCR NEGATIVE NEGATIVE Final   Influenza B by PCR NEGATIVE NEGATIVE Final    Comment: (NOTE) The Xpert Xpress SARS-CoV-2/FLU/RSV plus assay is intended as an aid in the diagnosis of influenza from Nasopharyngeal swab specimens and should not be used as a sole basis for treatment. Nasal washings and aspirates are unacceptable for Xpert Xpress SARS-CoV-2/FLU/RSV testing.  Fact Sheet for  Patients: BloggerCourse.com  Fact Sheet for Healthcare Providers: SeriousBroker.it  This test is not yet approved or cleared by the [place]  FDA and has been authorized for detection and/or diagnosis of SARS-CoV-2 by FDA under an Emergency Use Authorization (EUA). This EUA will remain in effect (meaning this test can be used) for the duration of the COVID-19 declaration under Section 564(b)(1) of the Act, 21 U.S.C. section 360bbb-3(b)(1), unless the authorization is terminated or revoked.     Resp Syncytial Virus by PCR NEGATIVE NEGATIVE Final    Comment: (NOTE) Fact Sheet for Patients: BloggerCourse.com  Fact Sheet for Healthcare Providers: SeriousBroker.it  This test is not yet approved or cleared by the [place]  FDA and has been authorized for detection and/or diagnosis of SARS-CoV-2 by FDA under an Emergency Use Authorization (EUA). This EUA will remain in effect (meaning this test can be used) for the duration of the COVID-19 declaration under Section 564(b)(1) of the Act, 21 U.S.C. section 360bbb-3(b)(1), unless the authorization is terminated or revoked.  Performed at [facility], [address] [place], [place] [address]   MRSA Next Gen by PCR, Nasal     Status: None   Collection Time: 09/05/23  1:05 PM   Specimen: Nasal Mucosa; Nasal Swab  Result Value Ref Range Status   MRSA by PCR Next Gen NOT DETECTED NOT DETECTED Final    Comment: (NOTE) The GeneXpert MRSA Assay (FDA approved for NASAL specimens only), is one component of a comprehensive MRSA colonization surveillance program. It is not intended to diagnose MRSA infection nor to guide or monitor treatment for MRSA infections. Test performance is not FDA approved in patients less than 39 years old. Performed at [facility], [address], [place], [place] [address]   Respiratory (~20 pathogens) panel  by PCR     Status: None   Collection Time: 09/05/23  1:05 PM   Specimen: Nasopharyngeal Swab; Respiratory  Result Value Ref Range Status   Adenovirus NOT DETECTED NOT DETECTED Final   Coronavirus 229E NOT DETECTED NOT DETECTED Final    Comment: (NOTE) The Coronavirus on the Respiratory Panel, DOES NOT test for the novel  Coronavirus (2019 nCoV)    Coronavirus HKU1 NOT DETECTED NOT DETECTED Final   Coronavirus NL63 NOT DETECTED NOT DETECTED Final   Coronavirus OC43 NOT DETECTED NOT DETECTED Final   Metapneumovirus NOT DETECTED NOT DETECTED Final   Rhinovirus / Enterovirus NOT DETECTED NOT DETECTED Final   Influenza A NOT DETECTED NOT DETECTED Final   Influenza B NOT DETECTED NOT DETECTED Final   Parainfluenza Virus  1 NOT DETECTED NOT DETECTED Final   Parainfluenza Virus 2 NOT DETECTED NOT DETECTED Final   Parainfluenza Virus 3 NOT DETECTED NOT DETECTED Final   Parainfluenza Virus 4 NOT DETECTED NOT DETECTED Final   Respiratory Syncytial Virus NOT DETECTED NOT DETECTED Final   Bordetella pertussis NOT DETECTED NOT DETECTED Final   Bordetella Parapertussis NOT DETECTED NOT DETECTED Final   Chlamydophila pneumoniae NOT DETECTED NOT DETECTED Final   Mycoplasma pneumoniae NOT DETECTED NOT DETECTED Final    Comment: Performed at [facility], [address] [place], [place] [address]    Labs: CBC: Recent Labs  Lab 06/07/24 1135 06/09/24 0641 06/11/24 0955 06/12/24 0503 06/14/24 0034 06/14/24 0437  WBC 6.7 7.0 8.2 8.3  --  6.9  NEUTROABS 5.4  --   --   --   --   --   HGB 11.8* 12.2* 11.5* 11.6* 12.6* 12.1*  HCT 36.4* 38.8* 35.3* 36.0* 39.0 36.7*  MCV 94.5 96.8 93.6 95.0  --  92.4  PLT 207 177 165 187  --  197   Basic Metabolic Panel: Recent Labs  Lab 06/07/24 1135 06/09/24 0641 06/10/24 0426 06/12/24 0503 06/14/24 0437  NA 139 139 139 135 140  K 4.2 3.9 4.5 3.9 4.0  CL 103 105 107 102 109  CO2 30 25 22  20* 20*  GLUCOSE 106* 96 100* 90 103*  BUN 26* 20 22 23  23   CREATININE 1.62* 1.31* 1.28* 1.24 1.25*  CALCIUM  9.4 9.5 9.3 9.1 9.1  PHOS  --   --  3.4  --   --    Liver Function Tests: Recent Labs  Lab 06/07/24 1135 06/09/24 0641 06/10/24 0426  AST 24 26  --   ALT 26 22  --   ALKPHOS 206* 262*  --   BILITOT 0.5 1.1  --   PROT 6.9 7.1  --   ALBUMIN 3.7 3.5 3.0*   CBG: Recent Labs  Lab 06/13/24 0729 06/13/24 1116 06/13/24 1809 06/13/24 2111 06/14/24 0754  GLUCAP 84 106* 113* 124* 124*    Discharge time spent: greater than 30 minutes.  Signed: Elidia Toribio Furnace, MD [facility] 06/14/2024

## 2024-06-14 NOTE — Progress Notes (Addendum)
 Patient yelling out for help upon entering room noted TR band off and patient bleeding profusely from right radial cath site. Manuel pressure held for 20 minutes prior to cath lab RN arrival. Consulting civil engineer, Rapid Response, Cath Lab and MD notfied.

## 2024-06-14 NOTE — Progress Notes (Signed)
  Progress Note  Patient Name: Joshua Compton. Date of Encounter: 06/14/2024 [facility] Cardiologist: Alvan Carrier, MD   Interval Summary    Overnight events noted, no complaints this morning. Says he lives with his daughter who helps with medications.   Vital Signs Vitals:   06/14/24 0311 06/14/24 0333 06/14/24 0354 06/14/24 0800  BP: (!) 143/61 (!) 147/81 (!) 140/61 128/81  Pulse: (!) 59 60 61   Resp: 17 16 14 19   Temp: 98 F (36.7 C) 97.8 F (36.6 C) 98 F (36.7 C) 98.3 F (36.8 C)  TempSrc: Oral Oral Oral Oral  SpO2: 95% 93% 97% 95%  Weight:      Height:        Intake/Output Summary (Last 24 hours) at 06/14/2024 0830 Last data filed at 06/14/2024 0600 Gross per 24 hour  Intake 120 ml  Output 200 ml  Net -80 ml      06/13/2024    4:24 AM 06/10/2024    8:12 PM 06/09/2024    5:50 AM  Last 3 Weights  Weight (lbs) 187 lb 8 oz 192 lb 0.3 oz 192 lb  Weight (kg) 85.049 kg 87.1 kg 87.091 kg      Telemetry/ECG  Sinus Rhythm - Personally Reviewed  Physical Exam  GEN: No acute distress.   Neck: No JVD Cardiac: RRR, no murmurs, rubs, or gallops.  Respiratory: Clear to auscultation bilaterally. GI: Soft, nontender, non-distended  MS: No edema Skin: right radial cath site stable.   Assessment & Plan   83 y.o. 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   NSTEMI -- hsTn peaked at 675, underwent cardiac catheterization yesterday with severe two-vessel CAD of 80% calcified lesion in the mid circumflex just after OM1 followed by a 90% heavily calcified lesion just after OM 2 and AV groove treated with complex OTC guided shockwave lithotripsy and DES x 1.  Also with focal 70% LAD lesion between 1st and 2nd diagonal branches which could be considered for staged PCI at later  date.  Recommendations for triple therapy with aspirin /Plavix  and Eliquis  for 1 month then stop aspirin  with continuation of Plavix  and Eliquis . -- Continue aspirin , Plavix , metoprolol  XL 12.5 mg -- CR to see this morning  Paroxysmal atrial fibrillation -- Maintaining sinus rhythm, resumed Eliquis .  -- continue Toprol  XL 12.5mg  daily   Chronic HFmrEF -- EF improved from 45 to 50% to 50 to 55% this admission -- GDMT: Continue Toprol -XL 12.5 mg daily, Entresto  24-26mg  BID.  Has not been on MRA or SGLT2 with history of AKI  Hyperlipidemia -- Continue atorvastatin   Metastatic adenocarcinoma -- On palliative radiation therapy  Will arrange for outpatient follow up in the office.   For questions or updates, please contact [facility] Please consult [email or web address] for contact info under       Signed, Manuelita Rummer, NP

## 2024-06-14 NOTE — TOC Initial Note (Signed)
 Transition of Care (TOC) - Initial/Assessment Note    Patient Details  Name: Joshua Compton. MRN: [id number] Date of Birth: [birth year]  Transition of Care [facility]) CM/SW Contact:    Sudie Erminio Deems, RN Phone Number: 06/14/2024, 10:54 AM  Clinical Narrative:  Patient presented for chest pain. PTA patient was from home with daughter Natalie and has support of daughter Arland that will provide transportation home and to the [facility] for 12:30 appointment. Patient has DME rolling walker in the home. Case Manager spoke with daughter and no home needs identified during the visit. Plan for transition home today.                  Expected Discharge Plan: Home/Self Care Barriers to Discharge: No Barriers Identified   Patient Goals and CMS Choice Patient states their goals for this hospitalization and ongoing recovery are:: plan to return home          Expected Discharge Plan and Services In-house Referral: NA Discharge Planning Services: CM Consult Post Acute Care Choice: NA Living arrangements for the past 2 months: Single Family Home Expected Discharge Date: 06/14/24                 DME Agency: NA                  Prior Living Arrangements/Services Living arrangements for the past 2 months: Single Family Home Lives with:: Adult Children          Need for Family Participation in Patient Care: Yes (Comment) Care giver support system in place?: Yes (comment) Current home services: DME (rolling walker) Criminal Activity/Legal Involvement Pertinent to Current Situation/Hospitalization: No - Comment as needed  Activities of Daily Living   ADL Screening (condition at time of admission) Independently performs ADLs?: Yes (appropriate for developmental age) Is the patient deaf or have difficulty hearing?: No Does the patient have difficulty seeing, even when wearing glasses/contacts?: No Does the patient have difficulty concentrating, remembering, or  making decisions?: No  Permission Sought/Granted Permission sought to share information with : Family Supports, Case Manager                Emotional Assessment Appearance:: Appears stated age Attitude/Demeanor/Rapport: Engaged Affect (typically observed): Appropriate Orientation: : Oriented to Self, Oriented to Place Alcohol / Substance Use: Not Applicable Psych Involvement: No (comment)  Admission diagnosis:  Chest pain [R07.9] Chest pain, unspecified type [R07.9] Patient Active Problem List   Diagnosis Date Noted   History of CVA (cerebrovascular accident) 06/11/2024   Chronic diastolic CHF (congestive heart failure) (HCC) 06/11/2024   Type 2 diabetes mellitus with hyperlipidemia (HCC) 06/11/2024   Elevated troponin 06/10/2024   Acute on chronic combined systolic and diastolic CHF (congestive heart failure) (HCC) 06/10/2024   NSTEMI (non-ST elevated myocardial infarction) (HCC) 06/09/2024   Metastatic adenocarcinoma (HCC) 06/09/2024   Pain from bone metastases (HCC) 05/26/2024   Cancer, metastatic to bone (HCC) 05/26/2024   Venous insufficiency of both lower extremities 02/01/2024   Chronic kidney disease, stage 3a (HCC) 02/01/2024   Centrilobular emphysema (HCC) 12/22/2023   Vitamin D  deficiency 09/18/2023   PAF (paroxysmal atrial fibrillation) (HCC) 09/07/2023   Cerebrovascular accident (CVA) due to embolism of left middle cerebral artery (HCC) 08/10/2023   Paresthesia of both feet 07/28/2023   Gastroesophageal reflux disease with esophagitis without hemorrhage 09/10/2021   Primary insomnia 09/10/2021   Vasculogenic erectile dysfunction 09/10/2021   Status post total replacement of left hip 01/07/2021   Primary  osteoarthritis of left hip 01/06/2021   Leg cramps 12/11/2020   Hyperlipidemia    Severe stage glaucoma 03/26/2012   Nuclear sclerosis 03/26/2012   Degenerative disc disease 02/04/2011   History of prostate cancer 02/04/2011   PCP:  [name],  MD Pharmacy:   [facility] [phone] - [place], [place] - [address] [place] [address] Phone: [phone] Fax: [phone]  [name] [address] [place] [address] Phone: [phone] Fax: [phone]     Social Drivers of Health (SDOH) Social History: SDOH Screenings   Food Insecurity: No Food Insecurity (06/09/2024)  Housing: Low Risk  (06/09/2024)  Transportation Needs: No Transportation Needs (06/09/2024)  Utilities: Not At Risk (06/09/2024)  Alcohol Screen: Low Risk  (01/14/2023)  Depression (PHQ2-9): Low Risk  (04/12/2024)  Financial Resource Strain: Low Risk  (05/26/2024)  Physical Activity: Inactive (05/26/2024)  Social Connections: Socially Integrated (06/09/2024)  Recent Concern: Social Connections - Moderately Isolated (05/26/2024)  Stress: No Stress Concern Present (05/26/2024)  Tobacco Use: Medium Risk (06/09/2024)   SDOH Interventions:     Readmission Risk Interventions    09/06/2023   10:18 AM  Readmission Risk Prevention Plan  Transportation Screening Complete  Home Care Screening Complete  Medication Review (RN CM) Complete

## 2024-06-14 NOTE — Progress Notes (Signed)
 Cardiac rehab came by to discuss with patient PCI & DAPT, restrictions, exercise guidelines, HH nutrition, angina/nitroglycerin use, and CRPII.  Stent card was given to pt along with MI book. Pt understood education and had no questions. Pt states he has been up walking around the room with no issues. Encouraged pt to continue walking throughout the day. CRPII referral placed for [facility].   Augustin Sharps, RRT [phone]

## 2024-06-15 ENCOUNTER — Ambulatory Visit
Admission: RE | Admit: 2024-06-15 | Discharge: 2024-06-15 | Disposition: A | Discharge status: Home / Self Care | Payer: Self-pay | Source: Ambulatory Visit | Attending: Radiation Oncology | Admitting: Radiation Oncology

## 2024-06-15 ENCOUNTER — Ambulatory Visit: Payer: Self-pay | Admitting: Family Medicine

## 2024-06-15 ENCOUNTER — Other Ambulatory Visit: Payer: Self-pay

## 2024-06-15 DIAGNOSIS — C7951 Secondary malignant neoplasm of bone: Secondary | ICD-10-CM | POA: Diagnosis not present

## 2024-06-15 DIAGNOSIS — C61 Malignant neoplasm of prostate: Secondary | ICD-10-CM | POA: Diagnosis not present

## 2024-06-15 DIAGNOSIS — C801 Malignant (primary) neoplasm, unspecified: Secondary | ICD-10-CM | POA: Diagnosis not present

## 2024-06-15 DIAGNOSIS — Z510 Encounter for antineoplastic radiation therapy: Secondary | ICD-10-CM | POA: Diagnosis not present

## 2024-06-15 DIAGNOSIS — E7841 Elevated Lipoprotein(a): Secondary | ICD-10-CM

## 2024-06-15 LAB — RAD ONC ARIA SESSION SUMMARY
Course Elapsed Days: 16
Plan Fractions Treated to Date: 10
Plan Prescribed Dose Per Fraction: 3 Gy
Plan Total Fractions Prescribed: 10
Plan Total Prescribed Dose: 30 Gy
Reference Point Dosage Given to Date: 30 Gy
Reference Point Session Dosage Given: 3 Gy
Session Number: 10

## 2024-06-15 LAB — LIPOPROTEIN A (LPA): Lipoprotein (a): 86.1 nmol/L — ABNORMAL HIGH (ref <75.0)

## 2024-06-16 ENCOUNTER — Other Ambulatory Visit: Payer: Self-pay | Admitting: Physician Assistant

## 2024-06-16 ENCOUNTER — Encounter (HOSPITAL_COMMUNITY)
Admission: RE | Admit: 2024-06-16 | Discharge: 2024-06-16 | Disposition: A | Discharge status: Home / Self Care | Source: Ambulatory Visit | Attending: Family Medicine | Admitting: Family Medicine

## 2024-06-16 DIAGNOSIS — C799 Secondary malignant neoplasm of unspecified site: Secondary | ICD-10-CM

## 2024-06-16 MED ORDER — LIDOCAINE-PRILOCAINE 2.5-2.5 % EX CREA: TOPICAL_CREAM | CUTANEOUS | 3 refills | Status: DC

## 2024-06-16 MED ORDER — PROCHLORPERAZINE MALEATE 10 MG PO TABS: 10.0000 mg | ORAL_TABLET | Freq: Four times a day (QID) | ORAL | 1 refills | Status: DC | PRN

## 2024-06-16 MED ORDER — ONDANSETRON HCL 8 MG PO TABS: 8.0000 mg | ORAL_TABLET | Freq: Three times a day (TID) | ORAL | 1 refills | Status: DC | PRN

## 2024-06-16 NOTE — Radiation Completion Notes (Addendum)
  Radiation Oncology         [phone]) [phone] ________________________________  Name: Joshua Compton. MRN: [id number]  Date of Service: 06/15/2024  DOB: 1941/01/30  End of Treatment Note  Diagnosis:  Metastatic adenocarcinoma of unknown primary.   Intent: Palliative     ==========DELIVERED PLANS==========  First Treatment Date: 2024-05-30 Last Treatment Date: 2024-06-15   Plan Name: Spine_T4 Site: Thoracic Spine Technique: 3D Mode: Photon Dose Per Fraction: 3 Gy Prescribed Dose (Delivered / Prescribed): 30 Gy / 30 Gy Prescribed Fxs (Delivered / Prescribed): 10 / 10     ==========ON TREATMENT VISIT DATES========== 2024-06-02, 2024-06-14    See weekly On Treatment Notes in Epic for details in the Media tab (listed as Progress notes on the On Treatment Visit Dates listed above). The patient tolerated radiation. He developed fatigue during therapy.  The patient will receive a call in about one month from the radiation oncology department. He will continue follow up with Dr. Federico as well.      Donald KYM Husband, PAC

## 2024-06-17 ENCOUNTER — Telehealth: Payer: Self-pay

## 2024-06-17 NOTE — Telephone Encounter (Signed)
 Spoke with patient's daughter, [name], port placement scheduled for 0900, please arrive at [facility] at 0815, pt must have driver to take home post procedure, NPO after midnight on Sunday, may take morning meds with sips of water  in AM (Monday), verbal acknowledgement noted

## 2024-06-17 NOTE — Discharge Instructions (Signed)
 Implanted Port Insertion After Care       After the procedure, it is common to have discomfort at the port insertion site, as well as bruising on the skin over the port. This should improve over 3-4 days.    After your port is placed, you will get a manufacturer's information card. The card has information about your port. Keep this card with you at all times.      Follow instructions from your health care provider about how to take care of your port insertion site. Make sure you wash your hands with soap and water for at least 20 seconds before and after you change your bandage (dressing). If soap and water are not available, use hand sanitizer.  Leave your initial bandage on for a full 24 hours. After 24 hours you may remove the dressing and shower.  Do not scrub directly on the incision site but rather above it and let the soapy water run over the incision. Pat dry after. You may then opt to redress your incision for your comfort but you may just leave it open to air.  The Dermabond (surgical super glue) will protect your incision and keep it clean and dry. Leave the layer of skin glue in place. If the glue edges start to loosen and curl up, you may trim the loose edges but do not pull or pick at it. Do NOT apply neosporin or other antibacterial ointment to the surgical glue.  It will dissolve the glue and expose your new incision to possible infection.  Do NOT apply EMLA  numbing cream to the surgical glue.  It will dissolve the glue and expose your new incision to possible infection.  You may have to wait to use the EMLA  cream until your incision has healed.    Return to your normal activities as told by your health care provider. Ask your health care provider what activities are safe for you. You may resume over-the-counter and prescription medicines as prescribed by your health care provider. Do not take baths, swim, or use a hot tub until your incision has healed completely (usually 2 weeks).      You were given a sedative during the procedure, and it can affect you for several hours. Do not drive, operate machinery or sign important documents for 24 hours after your procedure.     Please contact our office at [phone] and ask to speak with the nurse if you have any signs of an infection, such as fever or chills, redness, swelling, or pain around your port insertion site, fluid or blood coming from your port insertion site, if your port insertion site feels warm to the touch and/or if you have pus or a bad smell coming from the port insertion site.       If you need to speak to someone after hours, please call the Interventional Radiology on-call service at [phone] and tell them you are a patient of Dr. Terrill and you had a portacath placed today, as well as any issues you are having.    Thank you for visiting [facility] today!

## 2024-06-20 ENCOUNTER — Ambulatory Visit
Admit: 2024-06-20 | Discharge: 2024-06-20 | Disposition: A | Discharge status: Home / Self Care | Attending: Physician Assistant

## 2024-06-20 DIAGNOSIS — C799 Secondary malignant neoplasm of unspecified site: Secondary | ICD-10-CM

## 2024-06-20 HISTORY — PX: IR IMAGING GUIDED PORT INSERTION: IMG5740

## 2024-06-20 MED ORDER — FENTANYL CITRATE PF 50 MCG/ML IJ SOSY: 25.0000 ug | PREFILLED_SYRINGE | INTRAMUSCULAR | Status: DC | PRN

## 2024-06-20 MED ORDER — MIDAZOLAM HCL 2 MG/2ML IJ SOLN: 1.0000 mg | INTRAMUSCULAR | Status: DC | PRN

## 2024-06-20 MED ORDER — HEPARIN SOD (PORK) LOCK FLUSH 100 UNIT/ML IV SOLN
500.0000 [IU] | Freq: Once | INTRAVENOUS | Status: AC
  Administered 2024-06-20: 500 [IU]

## 2024-06-20 MED ORDER — FENTANYL CITRATE (PF) 100 MCG/2ML IJ SOLN
INTRAMUSCULAR | Status: AC | PRN
  Administered 2024-06-20: 50 ug via INTRAVENOUS

## 2024-06-20 MED ORDER — LIDOCAINE-EPINEPHRINE 1 %-1:100000 IJ SOLN
20.0000 mL | Freq: Once | INTRAMUSCULAR | Status: AC
  Administered 2024-06-20: 20 mL via INTRADERMAL

## 2024-06-20 MED ORDER — SODIUM CHLORIDE 0.9 % IV SOLN: INTRAVENOUS | Status: DC

## 2024-06-20 MED ORDER — MIDAZOLAM HCL 2 MG/2ML IJ SOLN
INTRAMUSCULAR | Status: AC | PRN
Start: 2024-06-20 — End: 2024-06-20
  Administered 2024-06-20: 1 mg via INTRAVENOUS

## 2024-06-20 NOTE — H&P (Signed)
 Chief Complaint: Patient was seen in consultation today for Thyroid  cancer- metastasis to bone; for chemotherapy at the request of Thayil,Irene T  Referring Physician(s): Neomi Johnston DASEN  Supervising Physician: Jennefer Rover  Patient Status: DRI LB  History of Present Illness: Kraig Genis. is a 83 y.o. male   Code status: DNR/DNI per pt and family at bedside  Pt with Hx Afib; CKD; CHF; Emphysema; Hx prostate cancer; thyroid  cancer-- mets to bone; CVA; DM; HDL  Follows with [facility] - Hospice/Palliative Team  Scheduled for Port a cath today for palliative chemotherapy- to start 06/24/24 per family Thyroid  surgery 06/27/24    Past Medical History:  Diagnosis Date   Acute heart failure with preserved ejection fraction (HFpEF) (HCC) 09/05/2023   Acute HFrEF (heart failure with reduced ejection fraction) (HCC) 09/08/2023   Acute respiratory failure with hypoxia (HCC) 09/05/2023   Allergy    Arthritis    Cancer (HCC)    prostate ca   seed implants   CHF (congestive heart failure) (HCC)    GERD (gastroesophageal reflux disease)    Glaucoma    Hyperlipidemia    Hypertension    dr cyrena     [place]   fm   Stroke [facility]) 07/2023    Past Surgical History:  Procedure Laterality Date   BACK SURGERY     x2   COLONOSCOPY     CORONARY IMAGING/OCT N/A 06/13/2024   Procedure: CORONARY IMAGING/OCT;  Surgeon: Anner Alm ORN, MD;  Location: [facility] INVASIVE CV LAB;  Service: Cardiovascular;  Laterality: N/A;   CORONARY LITHOTRIPSY N/A 06/13/2024   Procedure: CORONARY LITHOTRIPSY;  Surgeon: Anner Alm ORN, MD;  Location: [facility] INVASIVE CV LAB;  Service: Cardiovascular;  Laterality: N/A;   CORONARY STENT INTERVENTION N/A 06/13/2024   Procedure: CORONARY STENT INTERVENTION;  Surgeon: Anner Alm ORN, MD;  Location: [facility] INVASIVE CV LAB;  Service: Cardiovascular;  Laterality: N/A;   EYE SURGERY     cataract   HERNIA REPAIR  11/06/2009   left inguinal repair    LEFT HEART CATH AND  CORONARY ANGIOGRAPHY N/A 06/13/2024   Procedure: LEFT HEART CATH AND CORONARY ANGIOGRAPHY;  Surgeon: Anner Alm ORN, MD;  Location: [facility] INVASIVE CV LAB;  Service: Cardiovascular;  Laterality: N/A;   NECK SURGERY     ROTATOR CUFF REPAIR Left 11/2014   TOTAL HIP ARTHROPLASTY Right 04/20/2020   Procedure: RIGHT TOTAL HIP ARTHROPLASTY ANTERIOR APPROACH;  Surgeon: Jerri Kay HERO, MD;  Location: [facility] OR;  Service: Orthopedics;  Laterality: Right;   TOTAL HIP ARTHROPLASTY Left 01/07/2021   Procedure: LEFT TOTAL HIP ARTHROPLASTY ANTERIOR APPROACH;  Surgeon: Jerri Kay HERO, MD;  Location: [facility] OR;  Service: Orthopedics;  Laterality: Left;   TRANSURETHRAL RESECTION OF PROSTATE      Allergies: Iodinated contrast media and Pravastatin   Medications: Prior to Admission medications   Medication Sig Start Date End Date Taking? Authorizing Provider  amiodarone  (PACERONE ) 200 MG tablet Take 1 tablet (200 mg total) by mouth daily. 02/25/24   Alvan Dorn FALCON, MD  apixaban  (ELIQUIS ) 5 MG TABS tablet Take 1 tablet (5 mg total) by mouth 2 (two) times daily. 06/14/24   Arrien, Elidia Sieving, MD  aspirin  81 MG chewable tablet Chew 1 tablet (81 mg total) by mouth daily. 06/14/24   Arrien, Elidia Sieving, MD  atorvastatin  (LIPITOR) 40 MG tablet Take 1 tablet (40 mg total) by mouth daily. 02/18/24   Zollie Lowers, MD  cholecalciferol  (VITAMIN D3) 25 MCG (1000 UNIT) tablet TAKE 1 TABLET  BY MOUTH EVERY DAY 02/16/24   Zollie Lowers, MD  clopidogrel  (PLAVIX ) 75 MG tablet Take 1 tablet (75 mg total) by mouth daily with breakfast. 06/14/24 07/14/24  Arrien, Elidia Sieving, MD  dorzolamide -timolol  (COSOPT ) 22.3-6.8 MG/ML ophthalmic solution Place 1 drop into both eyes daily.  08/04/16   [provider]  Fluticasone -Umeclidin-Vilant (TRELEGY ELLIPTA ) 100-62.5-25 MCG/ACT AEPB Inhale 1 puff into the lungs daily. Patient not taking: Reported on 06/09/2024 01/22/24   Jude Harden GAILS, MD  lidocaine -prilocaine  (EMLA ) cream Apply to  affected area once 06/16/24   Federico Norleen [name], MD  metoprolol  succinate (TOPROL -XL) 25 MG 24 hr tablet TAKE 0.5 TABLETS (12.5 MG TOTAL) BY MOUTH DAILY. FOR BLOOD PRESSURE CONTROL 01/07/24   Alvan Dorn FALCON, MD  ondansetron  (ZOFRAN ) 8 MG tablet Take 1 tablet (8 mg total) by mouth every 8 (eight) hours as needed for nausea or vomiting. Start on the third day after chemotherapy. 06/16/24   Federico Norleen [name], MD  oxyCODONE  ER (XTAMPZA  ER) 9 MG C12A Take 9 mg by mouth every 12 (twelve) hours as needed. 05/26/24   Thayil, Irene T, PA-C  Oxycodone  HCl 10 MG TABS Take 1 tablet (10 mg total) by mouth every 6 (six) hours as needed for up to 120 doses. 05/02/24   Thayil, Irene T, PA-C  pantoprazole  (PROTONIX ) 40 MG tablet Take 1 tablet (40 mg total) by mouth daily. For heartburn 07/28/23   Zollie Lowers, MD  prochlorperazine  (COMPAZINE ) 10 MG tablet Take 1 tablet (10 mg total) by mouth every 6 (six) hours as needed for nausea or vomiting. 06/16/24   Federico Norleen [name], MD  rOPINIRole  (REQUIP ) 1 MG tablet Take 1 tablet (1 mg total) by mouth 3 (three) times daily. Plus an extra tablet at bedtime to total four a day 11/30/23   Zollie Lowers, MD  sacubitril -valsartan  (ENTRESTO ) 24-26 MG Take 1 tablet by mouth 2 (two) times daily. 04/05/24   Zollie Lowers, MD     Family History  Problem Relation Age of Onset   Cancer Mother        breast   Heart disease Father    Stroke Sister    Hypertension Sister    Healthy Sister    Heart attack Sister    Other Sister        unknown cause   Cancer Sister    Other Brother        multiple back surgeries   Hypertension Brother    Throat cancer Brother    Cancer Brother        groin related cancer   Heart attack Brother    Atrial fibrillation Daughter    Insurance account manager Parkinson White syndrome Daughter    Cancer Son        luekimia   Heart disease Paternal Uncle    Other Brother        suicide-   Colon cancer Neg Hx    Rectal cancer Neg Hx    Stomach cancer Neg Hx      Social History   Socioeconomic History   Marital status: Married    Spouse name: Not on file   Number of children: 5   Years of education: 11   Highest education level: 11th grade  Occupational History   Occupation: Nutritional therapist    Comment: retired  Tobacco Use   Smoking status: Former    Current packs/day: 0.00    Average packs/day: 0.5 packs/day for 30.0 years (15.0 ttl pk-yrs)  Types: Cigarettes    Start date: 03/29/1975    Quit date: 03/28/2005    Years since quitting: 19.2   Smokeless tobacco: Never  Vaping Use   Vaping status: Never Used  Substance and Sexual Activity   Alcohol use: No   Drug use: Never   Sexual activity: Not Currently    Birth control/protection: None  Other Topics Concern   Not on file  Social History Narrative   Married. 1 daughter and 1 son from current marriage. 3 daughters from prior marriage. 10 grandkids      Retired Proofreader: fishing, golf, enjoys being outdoors   Social Drivers of Corporate investment banker Strain: Low Risk  (05/26/2024)   Overall Financial Resource Strain (CARDIA)    Difficulty of Paying Living Expenses: Not hard at all  Food Insecurity: No Food Insecurity (06/09/2024)   Hunger Vital Sign    Worried About Running Out of Food in the Last Year: Never true    Ran Out of Food in the Last Year: Never true  Transportation Needs: No Transportation Needs (06/09/2024)   PRAPARE - Administrator, Civil Service (Medical): No    Lack of Transportation (Non-Medical): No  Physical Activity: Inactive (05/26/2024)   Exercise Vital Sign    Days of Exercise per Week: 0 days    Minutes of Exercise per Session: Not on file  Stress: No Stress Concern Present (05/26/2024)   [employer] of Occupational Health - Occupational Stress Questionnaire    Feeling of Stress: Not at all  Social Connections: Socially Integrated (06/09/2024)   Social Connection and Isolation Panel     Frequency of Communication with Friends and Family: More than three times a week    Frequency of Social Gatherings with Friends and Family: Twice a week    Attends Religious Services: More than 4 times per year    Active Member of [employer] or Organizations: Yes    Attends Banker Meetings: 1 to 4 times per year    Marital Status: Married  Recent Concern: Social Connections - Moderately Isolated (05/26/2024)   Social Connection and Isolation Panel    Frequency of Communication with Friends and Family: Once a week    Frequency of Social Gatherings with Friends and Family: Once a week    Attends Religious Services: Patient declined    Database administrator or Organizations: Yes    Attends Banker Meetings: 1 to 4 times per year    Marital Status: Married     Review of Systems: A 12 point ROS discussed and pertinent positives are indicated in the HPI above.  All other systems are negative.  Review of Systems  Constitutional:  Negative for activity change and fever.  Respiratory:  Negative for cough and shortness of breath.   Musculoskeletal:  Positive for gait problem.  Neurological:  Positive for weakness.  Psychiatric/Behavioral:  Positive for decreased concentration. Negative for behavioral problems.     Vital Signs: There were no vitals taken for this visit.  Advance Care Plan: The advanced care plan/surrogate decision maker was discussed at the time of visit and documented in the medical record.    Physical Exam HENT:     Mouth/Throat:     Mouth: Mucous membranes are moist.  Cardiovascular:     Rate and Rhythm: Normal rate. Rhythm irregular.     Heart sounds: No murmur heard. Pulmonary:  Effort: Pulmonary effort is normal.     Breath sounds: No wheezing.  Abdominal:     Palpations: Abdomen is soft.  Musculoskeletal:        General: Normal range of motion.  Skin:    General: Skin is warm.  Neurological:     Mental Status: He is alert.  Mental status is at baseline.     Comments: HCPOA Dtr signed consent for prrocedure     Imaging: CARDIAC CATHETERIZATION Result Date: 06/13/2024 Images from the original result were not included.   Mid Cx to Dist Cx lesion is 80% stenosed. Dist Cx lesion is 90% stenosed.  TIMI-3 flow   OCT guided IV L performed on both lesions using a 3.5 mm x 12 mm IV L balloon   A drug-eluting stent was successfully placed covering both lesions, using a STENT ONYX FRONTIER [id number].  The proximal 30 mm stent was postdilated to 3.75 mm with the stent itself deployed to 3.6 mm distally.  Post intervention, there is a 0% residual stenosis across both lesions; TIMI-3 flow maintained.   Mid LAD lesion is 70% stenosed.   RPDA lesion is 50% stenosed.   Dist Cx lesion is 90% stenosed.   Post intervention, there is a 0% residual stenosis.   Post intervention, there is a 0% residual stenosis.   LV end diastolic pressure is normal.   There is no aortic valve stenosis.   In the absence of any other complications or medical issues, we expect the patient to be ready for discharge from an interventional cardiology perspective on 06/14/2024.   Recommend to resume Apixaban , at currently prescribed dose and frequency on 06/14/2024.   Recommend concurrent antiplatelet therapy of Aspirin  81 mg for 1 month and Clopidogrel  75mg  daily for 12 months . Diagnostic Dominance: Co-dominant     Intervention POST-OPERATIVE DIAGNOSIS:  Codominant system Severe two-vessel disease: 80% calcified/fibrotic stenosis in the mid LCx just after OM1 followed by a 90% focal heavily calcified lesion just after OM 2 going into the AV groove. Successful complex OCT guided shockwave lithotripsy PTCA and DES PCI of mid and distal LCx lesions treated with Onyx frontier DES 3.5 mm x 38 mm postdilated and taper fashion from 3.75 to 3.6 mm reducing lesion to 0% with TIMI-3 flow maintained. Focal 70% LAD between 1st & 2nd Diag branches Consider staged PCI at a later date to  allow recovery from prolonged procedure. Normal codominant RCA with borderline 50% stenosis in the mid PDA. Normal LVEDP. PLAN OF CARE: Return to nursing unit for ongoing care post PCI; recommend aspirin  and Plavix  x 1 month along with restarting DOAC in the morning => With long stent would try to continue Plavix  DOAC to complete 1 year given ACS presentation but stop aspirin  after 1 month; Recommend to resume Apixaban , at currently prescribed dose and frequency on 06/14/2024. Recommend concurrent antiplatelet therapy of Aspirin  81 mg for 1 month and Clopidogrel  75mg  daily for 12 months . For the remaining LAD lesion, discussed the possibility of medical management versus staged PCI next week to allow time for full recovery.   In the absence of any other complications or medical issues, we expect the patient to be ready for discharge from an interventional cardiology perspective on 06/14/2024. [name], MD  ECHOCARDIOGRAM COMPLETE Result Date: 06/09/2024    ECHOCARDIOGRAM REPORT   Patient Name:   Joshua Compton. Date of Exam: 06/09/2024 Medical Rec #:  [id number]  Height:       72.0 in Accession #:    [id number]            Weight:       192.0 lb Date of Birth:  [birth year]             BSA:          2.094 m Patient Age:    83 years              BP:           149/62 mmHg Patient Gender: M                     HR:           68 bpm. Exam Location:  [facility] Procedure: 2D Echo, Cardiac Doppler and Color Doppler (Both Spectral and Color            Flow Doppler were utilized during procedure). Indications:    Chest Pain R07.9  History:        Patient has prior history of Echocardiogram examinations, most                 recent 12/16/2023. CHF, Stroke, Arrythmias:Atrial Fibrillation;                 Risk Factors:Hypertension and Dyslipidemia.  Sonographer:    Aida Pizza RCS Referring Phys: [id number] [facility]  Sonographer Comments: Suboptimal parasternal window. IMPRESSIONS  1. Global hypokinesis  slightly worse in inferior wall . Left ventricular ejection fraction, by estimation, is 50 to 55%. The left ventricle has low normal function. The left ventricle demonstrates global hypokinesis. The left ventricular internal cavity  size was mildly dilated. There is mild left ventricular hypertrophy. Left ventricular diastolic parameters are consistent with Grade I diastolic dysfunction (impaired relaxation).  2. Right ventricular systolic function is normal. The right ventricular size is normal.  3. The mitral valve is abnormal. Mild mitral valve regurgitation. No evidence of mitral stenosis.  4. The aortic valve was not well visualized. There is moderate calcification of the aortic valve. There is moderate thickening of the aortic valve. Aortic valve regurgitation is mild to moderate. Aortic valve sclerosis/calcification is present, without any evidence of aortic stenosis.  5. The inferior vena cava is normal in size with greater than 50% respiratory variability, suggesting right atrial pressure of 3 mmHg. FINDINGS  Left Ventricle: Global hypokinesis slightly worse in inferior wall. Left ventricular ejection fraction, by estimation, is 50 to 55%. The left ventricle has low normal function. The left ventricle demonstrates global hypokinesis. Strain was performed and  the global longitudinal strain is indeterminate. The left ventricular internal cavity size was mildly dilated. There is mild left ventricular hypertrophy. Left ventricular diastolic parameters are consistent with Grade I diastolic dysfunction (impaired relaxation). Right Ventricle: The right ventricular size is normal. No increase in right ventricular wall thickness. Right ventricular systolic function is normal. Left Atrium: Left atrial size was normal in size. Right Atrium: Right atrial size was normal in size. Pericardium: There is no evidence of pericardial effusion. Mitral Valve: The mitral valve is abnormal. There is mild thickening of the mitral  valve leaflet(s). There is mild calcification of the mitral valve leaflet(s). Mild mitral valve regurgitation. No evidence of mitral valve stenosis. Tricuspid Valve: The tricuspid valve is normal in structure. Tricuspid valve regurgitation is not demonstrated. No evidence of tricuspid stenosis. Aortic Valve: The aortic valve was not well visualized. There is moderate  calcification of the aortic valve. There is moderate thickening of the aortic valve. Aortic valve regurgitation is mild to moderate. Aortic valve sclerosis/calcification is present, without any evidence of aortic stenosis. Aortic valve mean gradient measures 9.0 mmHg. Aortic valve peak gradient measures 16.2 mmHg. Aortic valve area, by VTI measures 2.26 cm. Pulmonic Valve: The pulmonic valve was normal in structure. Pulmonic valve regurgitation is not visualized. No evidence of pulmonic stenosis. Aorta: The aortic root is normal in size and structure. Venous: The inferior vena cava is normal in size with greater than 50% respiratory variability, suggesting right atrial pressure of 3 mmHg. IAS/Shunts: No atrial level shunt detected by color flow Doppler. Additional Comments: 3D was performed not requiring image post processing on an independent workstation and was indeterminate.  LEFT VENTRICLE PLAX 2D LVIDd:         5.70 cm   Diastology LVIDs:         4.20 cm   LV e' medial:    8.92 cm/s LV PW:         1.00 cm   LV E/e' medial:  5.8 LV IVS:        1.20 cm   LV e' lateral:   13.20 cm/s LVOT diam:     2.30 cm   LV E/e' lateral: 3.9 LV SV:         104 LV SV Index:   50 LVOT Area:     4.15 cm  RIGHT VENTRICLE RV S prime:     17.30 cm/s TAPSE (M-mode): 3.0 cm LEFT ATRIUM             Index        RIGHT ATRIUM           Index LA diam:        3.60 cm 1.72 cm/m   RA Area:     16.90 cm LA Vol (A2C):   80.4 ml 38.40 ml/m  RA Volume:   39.30 ml  18.77 ml/m LA Vol (A4C):   46.5 ml 22.21 ml/m LA Biplane Vol: 64.2 ml 30.66 ml/m  AORTIC VALVE AV Area (Vmax):     2.48 cm AV Area (Vmean):   2.21 cm AV Area (VTI):     2.26 cm AV Vmax:           201.00 cm/s AV Vmean:          142.000 cm/s AV VTI:            0.462 m AV Peak Grad:      16.2 mmHg AV Mean Grad:      9.0 mmHg LVOT Vmax:         120.00 cm/s LVOT Vmean:        75.400 cm/s LVOT VTI:          0.252 m LVOT/AV VTI ratio: 0.54  AORTA Ao Root diam: 4.00 cm MITRAL VALVE MV Area (PHT): 1.71 cm    SHUNTS MV Decel Time: 444 msec    Systemic VTI:  0.25 m MV E velocity: 51.50 cm/s  Systemic Diam: 2.30 cm MV A velocity: 84.20 cm/s MV E/A ratio:  0.61 Maude Emmer MD Electronically signed by Maude Emmer MD Signature Date/Time: 06/09/2024/3:19:25 PM    Final    CT Angio Chest Pulmonary Embolism (PE) W or WO Contrast Result Date: 06/09/2024 CLINICAL DATA:  Pulmonary embolism suspected, high probability history of prostate cancer EXAM: CT ANGIOGRAPHY CHEST WITH CONTRAST TECHNIQUE: Multidetector CT imaging of the chest was performed  using the standard protocol during bolus administration of intravenous contrast. Multiplanar CT image reconstructions and MIPs were obtained to evaluate the vascular anatomy. RADIATION DOSE REDUCTION: This exam was performed according to the departmental dose-optimization program which includes automated exposure control, adjustment of the mA and/or kV according to patient size and/or use of iterative reconstruction technique. CONTRAST:  75mL OMNIPAQUE  IOHEXOL  350 MG/ML SOLN COMPARISON:  Multiple priors dated back to September 05, 2023. FINDINGS: Cardiovascular: The heart size is normal. Atherosclerotic calcifications of coronary arteries. No pericardial fluid. Mediastinum/Nodes: Right thyroid  lobe nodule measuring 3.6 cm. No enlarged mediastinal, hilar, or axillary lymph nodes. Trachea, and esophagus demonstrate no significant findings. Lungs/Pleura: Severe upper lobe predominant centrilobular emphysematous changes. Bilateral lower lobe fibrotic changes with associated architectural distortion, the  paraseptal emphysematous changes, bronchiectasis and bronchiolectasis. Bronchial and bronchiolar wall thickening. Left lower lobe subpleural consolidation/mass measuring 3.6 x 2.2 cm, stable to prior however gradually enlarging to September 05, 2023 which measured 1 x 1.4 cm. Multiple pulmonary nodules are stable to prior as follows: Right upper lobe ground-glass nodule in subpleural measuring 4 mm (4/66). Multiple perifissural nodules along the right minor fissure (4/73). These nodules are stable to prior. No new nodule.  No pleural effusion. Upper Abdomen: Multiple simple cortical cysts of the left kidney. Small hiatal hernia. Thickening of bilateral multiple subcutaneous adrenal gland query small nodules measuring 1 cm. Musculoskeletal: Lytic expansile osseous metastasis involving the posterior left fourth rib, stable to prior. Multiple soft tissue nodules in bilateral anterior chest wall for example image 4/100, 54. Additional small subcentimeter nodule in left breast region (4/60. Mild gynecomastia. Review of the MIP images confirms the above findings. IMPRESSION: No evidence of pulmonary embolus. Multiple pulmonary nodules, subcutaneous soft tissue nodules and left posterior fourth rib lytic expansile lesion, stable to prior. Follow-up according to oncologic protocols. Stable right thyroid  nodule. 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 Left lower lobe subpleural consolidation/mass versus round atelectasis stable to prior, however gradually enlarging to September 05, 2023. Malignancy cannot be excluded. Recommend PET-CT for further assessment. Electronically Signed   By: Megan  Zare M.D.   On: 06/09/2024 12:51   DG Chest Port 1 View Result Date: 06/09/2024 CLINICAL DATA:  Chest pains radiating posteriorly. EXAM: PORTABLE CHEST 1 VIEW COMPARISON:  CTA chest 06/05/2024 FINDINGS: Emphysematous and chronic changes of the lungs. Focal left lower lobe opacity on the prior  study is not seen today. Heart size and vascular pattern are normal. Mediastinum is stable with aortic tortuosity and atherosclerosis. The bones are demineralized. IMPRESSION: 1. Emphysematous and chronic changes of the lungs. No acute chest findings. 2. Aortic atherosclerosis. Electronically Signed   By: Francis Quam M.D.   On: 06/09/2024 07:08   CT Angio Chest PE W and/or Wo Contrast Result Date: 06/05/2024 EXAM: CTA of the Chest with contrast for PE 06/05/2024 07:47:26 PM TECHNIQUE: CTA of the chest was performed after the administration of intravenous contrast. Multiplanar reformatted images are provided for review. MIP images are provided for review. Automated exposure control, iterative reconstruction, and/or weight based adjustment of the mA/kV was utilized to reduce the radiation dose to as low as reasonably achievable. COMPARISON: PET/CT dated 04/29/2024. CLINICAL HISTORY: Pulmonary embolism (PE) suspected, low to intermediate prob, positive D-dimer. FINDINGS: PULMONARY ARTERIES: Pulmonary arteries are adequately opacified for evaluation. No evidence of pulmonary embolism. MEDIASTINUM: 3.6 cm right thyroid  mass, corresponding to the patient's biopsy proven thyroid  versus metastatic malignancy. Moderate 3-vessel coronary atherosclerosis. LYMPH NODES: No suspicious mediastinal  lymphadenopathy on CT. LUNGS AND PLEURA: Moderate centrilobular and paraseptal emphysematous changes, upper lung predominant. Mild patchy posterior lower lobe opacities, likely atelectasis. No pneumothorax. UPPER ABDOMEN: Simple left renal cysts measuring up to 4.9 cm, benign (Bosniak 1). No follow up is recommended. SOFT TISSUES AND BONES: Lytic metastasis involving the left posterior fourth rib, without pathologic fracture. Mild degenerative changes of the visualized thoracolumbar spine. AORTA: Although not tailored for evaluation of the thoracic aorta, there is no evidence of thoracic aortic aneurysm or dissection. Thoracic  aortic atherosclerosis. IMPRESSION: 1. No evidence of pulmonary embolism. 2. 3.6 cm right thyroid  mass, corresponding to the patient's biopsy proven primary vs metastatic malignancy. 3. Lytic metastasis involving the left posterior fourth rib, without pathologic fracture. Electronically signed by: Pinkie Pebbles MD 06/05/2024 08:02 PM EDT RP Workstation: [id number]   DG Chest 2 View Result Date: 06/05/2024 CLINICAL DATA:  Chest pain EXAM: CHEST - 2 VIEW COMPARISON:  X-ray 04/16/2024. PET-CT 04/29/2024 FINDINGS: Hyperinflation. Chronic lung changes. No consolidation, pneumothorax or effusion. No edema. Normal cardiopericardial silhouette. Slight increased density to the extreme upper mediastinum. Please correlate for known history of enlarged thyroid  gland. There is a nodular density along the lower left hemithorax along the anterior aspect of the left fifth rib. IMPRESSION: Hyperinflation with chronic changes. Asymmetric density in the lower left thorax. Based on the prior PET-CT this could be an area of nodular opacity of the lung. Recommend follow-up. Electronically Signed   By: Ranell Bring M.D.   On: 06/05/2024 14:16    Labs:  CBC: Recent Labs    06/09/24 0641 06/11/24 0955 06/12/24 0503 06/14/24 0034 06/14/24 0437  WBC 7.0 8.2 8.3  --  6.9  HGB 12.2* 11.5* 11.6* 12.6* 12.1*  HCT 38.8* 35.3* 36.0* 39.0 36.7*  PLT 177 165 187  --  197    COAGS: Recent Labs    06/11/24 1642 06/12/24 0503 06/13/24 0500 06/14/24 0034  APTT 56* 73* 66* 29    BMP: Recent Labs    06/09/24 0641 06/10/24 0426 06/12/24 0503 06/14/24 0437  NA 139 139 135 140  K 3.9 4.5 3.9 4.0  CL 105 107 102 109  CO2 25 22 20* 20*  GLUCOSE 96 100* 90 103*  BUN 20 22 23 23   CALCIUM  9.5 9.3 9.1 9.1  CREATININE 1.31* 1.28* 1.24 1.25*  GFRNONAA 54* 56* 58* 57*    LIVER FUNCTION TESTS: Recent Labs    04/20/24 1542 06/05/24 1525 06/07/24 1135 06/09/24 0641 06/10/24 0426  BILITOT 0.8 0.9 0.5 1.1  --    AST 18 29 24 26   --   ALT 20 38 26 22  --   ALKPHOS 106 127* 206* 262*  --   PROT 7.6 7.4 6.9 7.1  --   ALBUMIN 4.1 3.7 3.7 3.5 3.0*    TUMOR MARKERS: No results for input(s): AFPTM, CEA, CA199, CHROMGRNA in the last 8760 hours.  Assessment and Plan:  Scheduled today for Port a cath with sedation Risks and benefits of image guided port-a-catheter placement was discussed with the patient and family including, but not limited to bleeding, infection, pneumothorax, or fibrin sheath development and need for additional procedures.  All of the patient's questions were answered, patient and family are agreeable to proceed. Consent signed by MARYLAND Margaret [name] Georgina and in chart.  Thank you for this interesting consult.  I greatly enjoyed meeting Roniel Halloran. and look forward to participating in their care.  A copy of this report was sent to  the requesting provider on this date.  Electronically Signed: Sharlet DELENA Candle, PA-C 06/20/2024, 9:10 AM   I spent a total of  30 Minutes   in face to face in clinical consultation, greater than 50% of which was counseling/coordinating care for [facility] a cath placment

## 2024-06-20 NOTE — Procedures (Signed)
 Interventional Radiology Procedure Note  Procedure: Single Lumen Power Port Placement    Access:  Right internal jugular vein  Findings: Catheter tip positioned at cavoatrial junction. Port is ready for immediate use.   Complications: None  EBL: < 10 mL  Recommendations:  - Ok to shower in 24 hours - Do not submerge for 7 days - Routine line care    Marliss Coots, MD

## 2024-06-21 ENCOUNTER — Inpatient Hospital Stay

## 2024-06-21 ENCOUNTER — Encounter: Payer: Self-pay | Admitting: Medical Oncology

## 2024-06-21 ENCOUNTER — Inpatient Hospital Stay: Attending: Physician Assistant

## 2024-06-21 ENCOUNTER — Encounter: Payer: Self-pay | Admitting: Hematology and Oncology

## 2024-06-21 DIAGNOSIS — Z95828 Presence of other vascular implants and grafts: Secondary | ICD-10-CM | POA: Insufficient documentation

## 2024-06-21 DIAGNOSIS — C7989 Secondary malignant neoplasm of other specified sites: Secondary | ICD-10-CM | POA: Diagnosis not present

## 2024-06-21 DIAGNOSIS — C7951 Secondary malignant neoplasm of bone: Secondary | ICD-10-CM | POA: Diagnosis not present

## 2024-06-21 DIAGNOSIS — D709 Neutropenia, unspecified: Secondary | ICD-10-CM | POA: Diagnosis not present

## 2024-06-21 DIAGNOSIS — R5081 Fever presenting with conditions classified elsewhere: Secondary | ICD-10-CM | POA: Insufficient documentation

## 2024-06-21 DIAGNOSIS — C801 Malignant (primary) neoplasm, unspecified: Secondary | ICD-10-CM | POA: Insufficient documentation

## 2024-06-21 DIAGNOSIS — C799 Secondary malignant neoplasm of unspecified site: Secondary | ICD-10-CM

## 2024-06-21 DIAGNOSIS — Z5111 Encounter for antineoplastic chemotherapy: Secondary | ICD-10-CM | POA: Diagnosis present

## 2024-06-21 DIAGNOSIS — E876 Hypokalemia: Secondary | ICD-10-CM | POA: Insufficient documentation

## 2024-06-21 LAB — CMP (CANCER CENTER ONLY)
ALT: 22 U/L (ref 0–44)
AST: 23 U/L (ref 15–41)
Albumin: 3.4 g/dL — ABNORMAL LOW (ref 3.5–5.0)
Alkaline Phosphatase: 116 U/L (ref 38–126)
Anion gap: 7 (ref 5–15)
BUN: 15 mg/dL (ref 8–23)
CO2: 29 mmol/L (ref 22–32)
Calcium: 9.1 mg/dL (ref 8.9–10.3)
Chloride: 103 mmol/L (ref 98–111)
Creatinine: 1.07 mg/dL (ref 0.61–1.24)
GFR, Estimated: >60 mL/min (ref >60)
Glucose, Bld: 94 mg/dL (ref 70–99)
Potassium: 3.1 mmol/L — ABNORMAL LOW (ref 3.5–5.1)
Sodium: 139 mmol/L (ref 135–145)
Total Bilirubin: 1 mg/dL (ref 0.0–1.2)
Total Protein: 6.2 g/dL — ABNORMAL LOW (ref 6.5–8.1)

## 2024-06-21 LAB — CBC WITH DIFFERENTIAL (CANCER CENTER ONLY)
Abs Immature Granulocytes: 0.06 K/uL (ref 0.00–0.07)
Basophils Absolute: 0.1 K/uL (ref 0.0–0.1)
Basophils Relative: 1 %
Eosinophils Absolute: 0 K/uL (ref 0.0–0.5)
Eosinophils Relative: 0 %
HCT: 31.9 % — ABNORMAL LOW (ref 39.0–52.0)
Hemoglobin: 10.4 g/dL — ABNORMAL LOW (ref 13.0–17.0)
Immature Granulocytes: 1 %
Lymphocytes Relative: 6 %
Lymphs Abs: 0.4 K/uL — ABNORMAL LOW (ref 0.7–4.0)
MCH: 30.1 pg (ref 26.0–34.0)
MCHC: 32.6 g/dL (ref 30.0–36.0)
MCV: 92.2 fL (ref 80.0–100.0)
Monocytes Absolute: 0.8 K/uL (ref 0.1–1.0)
Monocytes Relative: 11 %
Neutro Abs: 5.8 K/uL (ref 1.7–7.7)
Neutrophils Relative %: 81 %
Platelet Count: 158 K/uL (ref 150–400)
RBC: 3.46 MIL/uL — ABNORMAL LOW (ref 4.22–5.81)
RDW: 15.9 % — ABNORMAL HIGH (ref 11.5–15.5)
WBC Count: 7.1 K/uL (ref 4.0–10.5)
nRBC: 0 % (ref 0.0–0.2)

## 2024-06-21 MED ORDER — SODIUM CHLORIDE 0.9% FLUSH
10.0000 mL | Freq: Once | INTRAVENOUS | Status: AC
Start: 2024-06-21 — End: 2024-06-21
  Administered 2024-06-21: 10 mL

## 2024-06-21 NOTE — Progress Notes (Signed)
 [facility]  Follow up call to patient's daughter, Mrs. Georgina, regarding patient's cancelled thyroid  surgery and inquire about the cancellation. Mrs. Georgina states that they were not informed of the cancellation or the reason why. I followed up with Dr. Ronold office and was informed that appointment was cancelled due to recent hospitalization and new MI with a plan to revisit surgery in three months. Dr. Ronold office confirmed will follow-up with Mrs. Moore to reschedule appointment.   Call back to Mrs. Moore, LVM, informing her that Dr. Ronold office will call her to reschedule surgery.    Colene KYM Raider, RN, BSN, [facility] Nurse Navigator, [facility] 06/21/2024 3:17 PM

## 2024-06-22 ENCOUNTER — Other Ambulatory Visit: Payer: Self-pay | Admitting: Physician Assistant

## 2024-06-22 DIAGNOSIS — E876 Hypokalemia: Secondary | ICD-10-CM

## 2024-06-23 ENCOUNTER — Encounter: Payer: Self-pay | Admitting: Physician Assistant

## 2024-06-23 ENCOUNTER — Encounter: Payer: Self-pay | Admitting: Oncology

## 2024-06-23 NOTE — Progress Notes (Signed)
 Received call from patient's daughter after given my card per my request.  Introduced myself as Dance movement psychotherapist and to offer available resources. Referred to insurance company to obtain detailed benefits as far as OOP/DED which they weren't sure the amounts. Advised [employer] will reach out which is our third party that assists with copay assistance if there is any available assistance for his insurance, diagnosis and or specific treatment drugs. She verbalized understanding.  Screened for one-time $1000 [employer] grant to assist with personal expenses while going through treatment and at this time he does not meet qualifications. Advised to contact me should he become eligible based on qualifications discussed. She verbalized understanding.  Discussed [employer] for patients whom live in [place] only and advised application may be obtained at registration if interested. She states they are interested in the information. Note placed on DAR to be given at appointment on 06/27/24.  They have my card for any additional financial questions or concerns.

## 2024-06-24 ENCOUNTER — Telehealth: Payer: Self-pay | Admitting: *Deleted

## 2024-06-24 ENCOUNTER — Other Ambulatory Visit: Payer: Self-pay | Admitting: Hematology and Oncology

## 2024-06-24 ENCOUNTER — Inpatient Hospital Stay

## 2024-06-24 ENCOUNTER — Other Ambulatory Visit: Payer: Self-pay | Admitting: *Deleted

## 2024-06-24 VITALS — BP 122/48 | HR 62 | Temp 98.8°F | Wt 191.4 lb

## 2024-06-24 DIAGNOSIS — R918 Other nonspecific abnormal finding of lung field: Secondary | ICD-10-CM

## 2024-06-24 DIAGNOSIS — Z5111 Encounter for antineoplastic chemotherapy: Secondary | ICD-10-CM | POA: Diagnosis not present

## 2024-06-24 DIAGNOSIS — C799 Secondary malignant neoplasm of unspecified site: Secondary | ICD-10-CM

## 2024-06-24 MED ORDER — FAMOTIDINE IN NACL 20-0.9 MG/50ML-% IV SOLN
20.0000 mg | Freq: Once | INTRAVENOUS | Status: AC
  Administered 2024-06-24: 20 mg via INTRAVENOUS
  Filled 2024-06-24: qty 50

## 2024-06-24 MED ORDER — PALONOSETRON HCL INJECTION 0.25 MG/5ML
0.2500 mg | Freq: Once | INTRAVENOUS | Status: AC
  Administered 2024-06-24: 0.25 mg via INTRAVENOUS
  Filled 2024-06-24: qty 5

## 2024-06-24 MED ORDER — SODIUM CHLORIDE 0.9 % IV SOLN
150.0000 mg | Freq: Once | INTRAVENOUS | Status: AC
  Administered 2024-06-24: 150 mg via INTRAVENOUS
  Filled 2024-06-24: qty 150

## 2024-06-24 MED ORDER — SODIUM CHLORIDE 0.9 % IV SOLN
135.0000 mg/m2 | Freq: Once | INTRAVENOUS | Status: AC
  Administered 2024-06-24: 282 mg via INTRAVENOUS
  Filled 2024-06-24: qty 46.18

## 2024-06-24 MED ORDER — SODIUM CHLORIDE 0.9 % IV SOLN
447.0000 mg | Freq: Once | INTRAVENOUS | Status: AC
  Administered 2024-06-24: 450 mg via INTRAVENOUS
  Filled 2024-06-24: qty 45

## 2024-06-24 MED ORDER — DIPHENHYDRAMINE HCL 50 MG/ML IJ SOLN
50.0000 mg | Freq: Once | INTRAMUSCULAR | Status: AC
  Administered 2024-06-24: 50 mg via INTRAVENOUS
  Filled 2024-06-24: qty 1

## 2024-06-24 MED ORDER — DEXAMETHASONE SODIUM PHOSPHATE 10 MG/ML IJ SOLN
10.0000 mg | Freq: Once | INTRAMUSCULAR | Status: AC
  Administered 2024-06-24: 10 mg via INTRAVENOUS
  Filled 2024-06-24: qty 1

## 2024-06-24 MED ORDER — SODIUM CHLORIDE 0.9 % IV SOLN: INTRAVENOUS | Status: DC

## 2024-06-24 NOTE — Progress Notes (Signed)
 02 ordered through [facility]. Fax'd order to them @ [phone]. A portable tank will be deleivered to the cancer center today, 06/24/24 prior to pt going home after his treatment .

## 2024-06-24 NOTE — Telephone Encounter (Addendum)
 06/24/24 Patient Saturation on room Air at rest= 85% 2.   Patient Saturation on Room Air while ambulating = 77% 3.  Patient Saturation on 2l/min of oxygen  while ambulating Patient cannot /unable to increase S02 by any other means  Received order for 02 @ 2l/min per nasal cannula from Dr. Federico

## 2024-06-24 NOTE — Patient Instructions (Signed)
 [facility] - A DEPT OF [facility]  Discharge Instructions: Thank you for choosing [facility] to provide your oncology and hematology care.   If you have a lab appointment with the Cancer Center, please go directly to the Cancer Center and check in at the registration area.   Wear comfortable clothing and clothing appropriate for easy access to any Portacath or PICC line.   We strive to give you quality time with your provider. You may need to reschedule your appointment if you arrive late (15 or more minutes).  Arriving late affects you and other patients whose appointments are after yours.  Also, if you miss three or more appointments without notifying the office, you may be dismissed from the clinic at the provider's discretion.      For prescription refill requests, have your pharmacy contact our office and allow 72 hours for refills to be completed.    Today you received the following chemotherapy and/or immunotherapy agents: CARBOplatin  (PARAPLATIN ), PACLitaxel  (TAXOL )    To help prevent nausea and vomiting after your treatment, we encourage you to take your nausea medication as directed.  BELOW ARE SYMPTOMS THAT SHOULD BE REPORTED IMMEDIATELY: *FEVER GREATER THAN 100.4 F (38 C) OR HIGHER *CHILLS OR SWEATING *NAUSEA AND VOMITING THAT IS NOT CONTROLLED WITH YOUR NAUSEA MEDICATION *UNUSUAL SHORTNESS OF BREATH *UNUSUAL BRUISING OR BLEEDING *URINARY PROBLEMS (pain or burning when urinating, or frequent urination) *BOWEL PROBLEMS (unusual diarrhea, constipation, pain near the anus) TENDERNESS IN MOUTH AND THROAT WITH OR WITHOUT PRESENCE OF ULCERS (sore throat, sores in mouth, or a toothache) UNUSUAL RASH, SWELLING OR PAIN  UNUSUAL VAGINAL DISCHARGE OR ITCHING   Items with * indicate a potential emergency and should be followed up as soon as possible or go to the Emergency Department if any problems should occur.  Please show the  CHEMOTHERAPY ALERT CARD or IMMUNOTHERAPY ALERT CARD at check-in to the Emergency Department and triage nurse.  Should you have questions after your visit or need to cancel or reschedule your appointment, please contact [facility] - A DEPT OF [facility]  Dept: [phone]  and follow the prompts.  Office hours are 8:00 a.m. to 4:30 p.m. Monday - Friday. Please note that voicemails left after 4:00 p.m. may not be returned until the following business day.  We are closed weekends and major holidays. You have access to a nurse at all times for urgent questions. Please call the main number to the clinic Dept: [phone] and follow the prompts.   For any non-urgent questions, you may also contact your provider using MyChart. We now offer e-Visits for anyone 11 and older to request care online for non-urgent symptoms. For details visit [email or web address].   Also download the MyChart app! Go to the app store, search MyChart, open the app, select [facility], and log in with your MyChart username and password.

## 2024-06-27 ENCOUNTER — Encounter: Payer: Self-pay | Admitting: Hematology and Oncology

## 2024-06-27 ENCOUNTER — Telehealth: Payer: Self-pay

## 2024-06-27 ENCOUNTER — Encounter: Payer: Self-pay | Admitting: Physician Assistant

## 2024-06-27 ENCOUNTER — Inpatient Hospital Stay

## 2024-06-27 ENCOUNTER — Ambulatory Visit (HOSPITAL_COMMUNITY): Admit: 2024-06-27 | Admitting: Surgery

## 2024-06-27 VITALS — BP 114/43 | HR 66 | Temp 98.6°F | Resp 16

## 2024-06-27 DIAGNOSIS — C801 Malignant (primary) neoplasm, unspecified: Secondary | ICD-10-CM

## 2024-06-27 SURGERY — LOBECTOMY, THYROID
Anesthesia: General | Laterality: Right

## 2024-06-27 MED ORDER — PEGFILGRASTIM-FPGK 6 MG/0.6ML ~~LOC~~ SOSY
6.0000 mg | PREFILLED_SYRINGE | Freq: Once | SUBCUTANEOUS | Status: AC
Start: 2024-06-27 — End: 2024-06-27
  Administered 2024-06-27 (×2): 6 mg via SUBCUTANEOUS
  Filled 2024-06-27: qty 0.6

## 2024-06-27 NOTE — Telephone Encounter (Signed)
 Spoke with patient's daughter and informed her that a call was made to [facility] regarding a portable oxygen  tank. Advised her that [employer] required additional information, which was faxed to them.  Daughter voiced thanks.

## 2024-06-27 NOTE — Telephone Encounter (Signed)
 Received message through secure chat in regards to portable oxygen  tank.  Patient stated that they have received oxygen  concentrator and tank but no portable oxygen  container.    Spoke with Dacla at [facility] where oxygen  was ordered through and she states that they need oxygen  testing and recent doctors note to proceed with Portable oxygen  order.   Faxed oxygen  testing performed in office on 06/24/24 and most recent note from 06/07/24 to [phone] with confirmation.

## 2024-06-28 ENCOUNTER — Encounter: Payer: Self-pay | Admitting: Medical Oncology

## 2024-06-28 ENCOUNTER — Telehealth: Payer: Self-pay | Admitting: *Deleted

## 2024-06-28 NOTE — Telephone Encounter (Signed)
 Contacted by Veva w/[employer] regarding Oxygen  orders. Patient is currently receiving oxygen  support from them. She states that for insurance reimbursement purposes, oxygen  testing must be noted in provider OV note. Oxygen  test from 06/24/24 noted in chart. Need for oxygen  and test from 06/24/24 can be noted in patient's next OV note with provider and then that note faxed to [facility] @ [phone]. Message sent to provider and support staff with this information.

## 2024-06-29 ENCOUNTER — Encounter: Payer: Self-pay | Admitting: Physician Assistant

## 2024-06-30 ENCOUNTER — Inpatient Hospital Stay

## 2024-06-30 ENCOUNTER — Other Ambulatory Visit: Payer: Self-pay

## 2024-06-30 ENCOUNTER — Inpatient Hospital Stay (HOSPITAL_BASED_OUTPATIENT_CLINIC_OR_DEPARTMENT_OTHER): Admitting: Physician Assistant

## 2024-06-30 ENCOUNTER — Encounter (HOSPITAL_COMMUNITY): Payer: Self-pay

## 2024-06-30 ENCOUNTER — Inpatient Hospital Stay (HOSPITAL_COMMUNITY)
Admit: 2024-06-30 | Discharge: 2024-07-03 | DRG: 808 | Disposition: A | Discharge status: Hospice | Attending: Internal Medicine | Admitting: Internal Medicine

## 2024-06-30 ENCOUNTER — Encounter: Payer: Self-pay | Admitting: Cardiology

## 2024-06-30 ENCOUNTER — Ambulatory Visit (HOSPITAL_COMMUNITY)
Admission: RE | Admit: 2024-06-30 | Discharge: 2024-06-30 | Disposition: A | Discharge status: Home / Self Care | Source: Ambulatory Visit | Attending: Physician Assistant | Admitting: Physician Assistant

## 2024-06-30 ENCOUNTER — Encounter (HOSPITAL_COMMUNITY): Payer: Self-pay | Admitting: Internal Medicine

## 2024-06-30 ENCOUNTER — Ambulatory Visit (HOSPITAL_BASED_OUTPATIENT_CLINIC_OR_DEPARTMENT_OTHER): Admitting: Internal Medicine

## 2024-06-30 ENCOUNTER — Ambulatory Visit: Attending: Cardiology | Admitting: Cardiology

## 2024-06-30 VITALS — BP 124/58 | HR 77 | Ht 72.0 in | Wt 189.6 lb

## 2024-06-30 VITALS — BP 150/51 | HR 86 | Temp 100.1°F | Resp 17 | Ht 72.0 in | Wt 189.0 lb

## 2024-06-30 DIAGNOSIS — B952 Enterococcus as the cause of diseases classified elsewhere: Secondary | ICD-10-CM | POA: Diagnosis present

## 2024-06-30 DIAGNOSIS — K2100 Gastro-esophageal reflux disease with esophagitis, without bleeding: Secondary | ICD-10-CM | POA: Diagnosis not present

## 2024-06-30 DIAGNOSIS — I872 Venous insufficiency (chronic) (peripheral): Secondary | ICD-10-CM | POA: Diagnosis present

## 2024-06-30 DIAGNOSIS — J189 Pneumonia, unspecified organism: Secondary | ICD-10-CM | POA: Diagnosis present

## 2024-06-30 DIAGNOSIS — E876 Hypokalemia: Secondary | ICD-10-CM

## 2024-06-30 DIAGNOSIS — Z8673 Personal history of transient ischemic attack (TIA), and cerebral infarction without residual deficits: Secondary | ICD-10-CM | POA: Diagnosis not present

## 2024-06-30 DIAGNOSIS — Z888 Allergy status to other drugs, medicaments and biological substances status: Secondary | ICD-10-CM

## 2024-06-30 DIAGNOSIS — R509 Fever, unspecified: Secondary | ICD-10-CM | POA: Diagnosis not present

## 2024-06-30 DIAGNOSIS — D708 Other neutropenia: Principal | ICD-10-CM | POA: Diagnosis present

## 2024-06-30 DIAGNOSIS — C799 Secondary malignant neoplasm of unspecified site: Secondary | ICD-10-CM | POA: Diagnosis not present

## 2024-06-30 DIAGNOSIS — D6481 Anemia due to antineoplastic chemotherapy: Secondary | ICD-10-CM | POA: Diagnosis present

## 2024-06-30 DIAGNOSIS — J440 Chronic obstructive pulmonary disease with acute lower respiratory infection: Secondary | ICD-10-CM | POA: Diagnosis present

## 2024-06-30 DIAGNOSIS — I959 Hypotension, unspecified: Secondary | ICD-10-CM | POA: Diagnosis not present

## 2024-06-30 DIAGNOSIS — J9811 Atelectasis: Secondary | ICD-10-CM | POA: Diagnosis present

## 2024-06-30 DIAGNOSIS — N1831 Chronic kidney disease, stage 3a: Secondary | ICD-10-CM | POA: Diagnosis present

## 2024-06-30 DIAGNOSIS — I700 Atherosclerosis of aorta: Secondary | ICD-10-CM | POA: Diagnosis present

## 2024-06-30 DIAGNOSIS — Z955 Presence of coronary angioplasty implant and graft: Secondary | ICD-10-CM

## 2024-06-30 DIAGNOSIS — J9611 Chronic respiratory failure with hypoxia: Secondary | ICD-10-CM | POA: Diagnosis present

## 2024-06-30 DIAGNOSIS — I130 Hypertensive heart and chronic kidney disease with heart failure and stage 1 through stage 4 chronic kidney disease, or unspecified chronic kidney disease: Secondary | ICD-10-CM | POA: Diagnosis present

## 2024-06-30 DIAGNOSIS — I252 Old myocardial infarction: Secondary | ICD-10-CM

## 2024-06-30 DIAGNOSIS — R5081 Fever presenting with conditions classified elsewhere: Secondary | ICD-10-CM | POA: Diagnosis present

## 2024-06-30 DIAGNOSIS — Z7901 Long term (current) use of anticoagulants: Secondary | ICD-10-CM

## 2024-06-30 DIAGNOSIS — J432 Centrilobular emphysema: Secondary | ICD-10-CM | POA: Diagnosis present

## 2024-06-30 DIAGNOSIS — I5032 Chronic diastolic (congestive) heart failure: Secondary | ICD-10-CM | POA: Diagnosis not present

## 2024-06-30 DIAGNOSIS — R7881 Bacteremia: Secondary | ICD-10-CM | POA: Diagnosis not present

## 2024-06-30 DIAGNOSIS — C801 Malignant (primary) neoplasm, unspecified: Secondary | ICD-10-CM

## 2024-06-30 DIAGNOSIS — R54 Age-related physical debility: Secondary | ICD-10-CM | POA: Diagnosis present

## 2024-06-30 DIAGNOSIS — Z66 Do not resuscitate: Secondary | ICD-10-CM | POA: Diagnosis present

## 2024-06-30 DIAGNOSIS — E1122 Type 2 diabetes mellitus with diabetic chronic kidney disease: Secondary | ICD-10-CM | POA: Diagnosis present

## 2024-06-30 DIAGNOSIS — I214 Non-ST elevation (NSTEMI) myocardial infarction: Secondary | ICD-10-CM | POA: Diagnosis present

## 2024-06-30 DIAGNOSIS — D709 Neutropenia, unspecified: Secondary | ICD-10-CM | POA: Diagnosis present

## 2024-06-30 DIAGNOSIS — I2510 Atherosclerotic heart disease of native coronary artery without angina pectoris: Secondary | ICD-10-CM | POA: Diagnosis not present

## 2024-06-30 DIAGNOSIS — R197 Diarrhea, unspecified: Secondary | ICD-10-CM | POA: Diagnosis not present

## 2024-06-30 DIAGNOSIS — E1169 Type 2 diabetes mellitus with other specified complication: Secondary | ICD-10-CM | POA: Diagnosis present

## 2024-06-30 DIAGNOSIS — D61818 Other pancytopenia: Secondary | ICD-10-CM

## 2024-06-30 DIAGNOSIS — Z8546 Personal history of malignant neoplasm of prostate: Secondary | ICD-10-CM

## 2024-06-30 DIAGNOSIS — Z515 Encounter for palliative care: Secondary | ICD-10-CM | POA: Diagnosis not present

## 2024-06-30 DIAGNOSIS — Z7951 Long term (current) use of inhaled steroids: Secondary | ICD-10-CM

## 2024-06-30 DIAGNOSIS — Z7189 Other specified counseling: Secondary | ICD-10-CM | POA: Diagnosis not present

## 2024-06-30 DIAGNOSIS — D6869 Other thrombophilia: Secondary | ICD-10-CM

## 2024-06-30 DIAGNOSIS — Z9981 Dependence on supplemental oxygen: Secondary | ICD-10-CM | POA: Diagnosis not present

## 2024-06-30 DIAGNOSIS — I480 Paroxysmal atrial fibrillation: Secondary | ICD-10-CM | POA: Diagnosis present

## 2024-06-30 DIAGNOSIS — R7401 Elevation of levels of liver transaminase levels: Secondary | ICD-10-CM | POA: Diagnosis not present

## 2024-06-30 DIAGNOSIS — L89322 Pressure ulcer of left buttock, stage 2: Secondary | ICD-10-CM | POA: Diagnosis present

## 2024-06-30 DIAGNOSIS — J9601 Acute respiratory failure with hypoxia: Secondary | ICD-10-CM | POA: Diagnosis not present

## 2024-06-30 DIAGNOSIS — Z87891 Personal history of nicotine dependence: Secondary | ICD-10-CM

## 2024-06-30 DIAGNOSIS — Z9079 Acquired absence of other genital organ(s): Secondary | ICD-10-CM

## 2024-06-30 DIAGNOSIS — I63412 Cerebral infarction due to embolism of left middle cerebral artery: Secondary | ICD-10-CM | POA: Diagnosis not present

## 2024-06-30 DIAGNOSIS — I5043 Acute on chronic combined systolic (congestive) and diastolic (congestive) heart failure: Secondary | ICD-10-CM | POA: Diagnosis present

## 2024-06-30 DIAGNOSIS — Z96643 Presence of artificial hip joint, bilateral: Secondary | ICD-10-CM | POA: Diagnosis present

## 2024-06-30 DIAGNOSIS — L0390 Cellulitis, unspecified: Secondary | ICD-10-CM | POA: Diagnosis present

## 2024-06-30 DIAGNOSIS — B961 Klebsiella pneumoniae [K. pneumoniae] as the cause of diseases classified elsewhere: Secondary | ICD-10-CM | POA: Diagnosis present

## 2024-06-30 DIAGNOSIS — D649 Anemia, unspecified: Secondary | ICD-10-CM | POA: Diagnosis present

## 2024-06-30 DIAGNOSIS — C7951 Secondary malignant neoplasm of bone: Secondary | ICD-10-CM | POA: Diagnosis present

## 2024-06-30 DIAGNOSIS — R042 Hemoptysis: Secondary | ICD-10-CM | POA: Diagnosis present

## 2024-06-30 DIAGNOSIS — D508 Other iron deficiency anemias: Secondary | ICD-10-CM | POA: Diagnosis not present

## 2024-06-30 DIAGNOSIS — T451X5A Adverse effect of antineoplastic and immunosuppressive drugs, initial encounter: Secondary | ICD-10-CM | POA: Diagnosis present

## 2024-06-30 DIAGNOSIS — Z823 Family history of stroke: Secondary | ICD-10-CM

## 2024-06-30 DIAGNOSIS — Z91041 Radiographic dye allergy status: Secondary | ICD-10-CM

## 2024-06-30 DIAGNOSIS — Z79899 Other long term (current) drug therapy: Secondary | ICD-10-CM

## 2024-06-30 DIAGNOSIS — Z8249 Family history of ischemic heart disease and other diseases of the circulatory system: Secondary | ICD-10-CM

## 2024-06-30 DIAGNOSIS — Z555 Less than a high school diploma: Secondary | ICD-10-CM

## 2024-06-30 DIAGNOSIS — E079 Disorder of thyroid, unspecified: Secondary | ICD-10-CM | POA: Diagnosis present

## 2024-06-30 DIAGNOSIS — Z803 Family history of malignant neoplasm of breast: Secondary | ICD-10-CM

## 2024-06-30 DIAGNOSIS — B9620 Unspecified Escherichia coli [E. coli] as the cause of diseases classified elsewhere: Secondary | ICD-10-CM | POA: Diagnosis present

## 2024-06-30 DIAGNOSIS — R195 Other fecal abnormalities: Secondary | ICD-10-CM | POA: Diagnosis not present

## 2024-06-30 DIAGNOSIS — L8990 Pressure ulcer of unspecified site, unspecified stage: Secondary | ICD-10-CM | POA: Diagnosis present

## 2024-06-30 DIAGNOSIS — Z808 Family history of malignant neoplasm of other organs or systems: Secondary | ICD-10-CM

## 2024-06-30 DIAGNOSIS — H409 Unspecified glaucoma: Secondary | ICD-10-CM | POA: Diagnosis present

## 2024-06-30 DIAGNOSIS — E785 Hyperlipidemia, unspecified: Secondary | ICD-10-CM | POA: Diagnosis present

## 2024-06-30 DIAGNOSIS — Z7902 Long term (current) use of antithrombotics/antiplatelets: Secondary | ICD-10-CM

## 2024-06-30 LAB — CBC WITH DIFFERENTIAL (CANCER CENTER ONLY)
Abs Immature Granulocytes: 0 K/uL (ref 0.00–0.07)
Basophils Absolute: 0 K/uL (ref 0.0–0.1)
Basophils Relative: 0 %
Eosinophils Absolute: 0 K/uL (ref 0.0–0.5)
Eosinophils Relative: 11 %
HCT: 24.8 % — ABNORMAL LOW (ref 39.0–52.0)
Hemoglobin: 8.4 g/dL — ABNORMAL LOW (ref 13.0–17.0)
Immature Granulocytes: 0 %
Lymphocytes Relative: 37 %
Lymphs Abs: 0.1 K/uL — ABNORMAL LOW (ref 0.7–4.0)
MCH: 30.3 pg (ref 26.0–34.0)
MCHC: 33.9 g/dL (ref 30.0–36.0)
MCV: 89.5 fL (ref 80.0–100.0)
Monocytes Absolute: 0.1 K/uL (ref 0.1–1.0)
Monocytes Relative: 41 %
Neutro Abs: 0 K/uL — CL (ref 1.7–7.7)
Neutrophils Relative %: 11 %
Platelet Count: 67 K/uL — ABNORMAL LOW (ref 150–400)
RBC: 2.77 MIL/uL — ABNORMAL LOW (ref 4.22–5.81)
RDW: 14.8 % (ref 11.5–15.5)
WBC Count: 0.2 K/uL — CL (ref 4.0–10.5)
nRBC: 0 % (ref 0.0–0.2)

## 2024-06-30 LAB — EXPECTORATED SPUTUM ASSESSMENT W GRAM STAIN, RFLX TO RESP C

## 2024-06-30 LAB — MAGNESIUM: Magnesium: 1.8 mg/dL (ref 1.7–2.4)

## 2024-06-30 LAB — URINALYSIS, COMPLETE (UACMP) WITH MICROSCOPIC
Bilirubin Urine: NEGATIVE
Glucose, UA: NEGATIVE mg/dL
Ketones, ur: NEGATIVE mg/dL
Leukocytes,Ua: NEGATIVE
Nitrite: NEGATIVE
Protein, ur: >=300 mg/dL — AB
Specific Gravity, Urine: 1.018 (ref 1.005–1.030)
pH: 5 (ref 5.0–8.0)

## 2024-06-30 LAB — RESP PANEL BY RT-PCR (RSV, FLU A&B, COVID)  RVPGX2
Influenza A by PCR: NEGATIVE
Influenza B by PCR: NEGATIVE
Resp Syncytial Virus by PCR: NEGATIVE
SARS Coronavirus 2 by RT PCR: NEGATIVE

## 2024-06-30 LAB — PROCALCITONIN: Procalcitonin: 1.11 ng/mL

## 2024-06-30 LAB — CMP (CANCER CENTER ONLY)
ALT: 57 U/L — ABNORMAL HIGH (ref 0–44)
AST: 45 U/L — ABNORMAL HIGH (ref 15–41)
Albumin: 3 g/dL — ABNORMAL LOW (ref 3.5–5.0)
Alkaline Phosphatase: 105 U/L (ref 38–126)
Anion gap: 7 (ref 5–15)
BUN: 16 mg/dL (ref 8–23)
CO2: 33 mmol/L — ABNORMAL HIGH (ref 22–32)
Calcium: 8.5 mg/dL — ABNORMAL LOW (ref 8.9–10.3)
Chloride: 100 mmol/L (ref 98–111)
Creatinine: 1.04 mg/dL (ref 0.61–1.24)
GFR, Estimated: >60 mL/min (ref >60)
Glucose, Bld: 109 mg/dL — ABNORMAL HIGH (ref 70–99)
Potassium: 2.9 mmol/L — ABNORMAL LOW (ref 3.5–5.1)
Sodium: 140 mmol/L (ref 135–145)
Total Bilirubin: 1.1 mg/dL (ref 0.0–1.2)
Total Protein: 5.7 g/dL — ABNORMAL LOW (ref 6.5–8.1)

## 2024-06-30 LAB — GLUCOSE, CAPILLARY: Glucose-Capillary: 106 mg/dL — ABNORMAL HIGH (ref 70–99)

## 2024-06-30 MED ORDER — TORSEMIDE 20 MG PO TABS: 20.0000 mg | ORAL_TABLET | Freq: Every day | ORAL | 6 refills | Status: DC

## 2024-06-30 MED ORDER — SODIUM CHLORIDE 0.9% FLUSH: 3.0000 mL | Freq: Two times a day (BID) | INTRAVENOUS | Status: DC

## 2024-06-30 MED ORDER — GUAIFENESIN ER 600 MG PO TB12
1200.0000 mg | ORAL_TABLET | Freq: Two times a day (BID) | ORAL | Status: DC
  Administered 2024-06-30 – 2024-07-03 (×6): 1200 mg via ORAL
  Filled 2024-06-30 (×6): qty 2

## 2024-06-30 MED ORDER — AMIODARONE HCL 200 MG PO TABS
200.0000 mg | ORAL_TABLET | Freq: Every day | ORAL | Status: DC
  Administered 2024-07-01 – 2024-07-03 (×3): 200 mg via ORAL
  Filled 2024-06-30 (×3): qty 1

## 2024-06-30 MED ORDER — SODIUM CHLORIDE 0.9% FLUSH
10.0000 mL | Freq: Two times a day (BID) | INTRAVENOUS | Status: DC
  Administered 2024-07-01: 10 mL
  Administered 2024-07-01: 30 mL
  Administered 2024-07-02 – 2024-07-03 (×3): 10 mL

## 2024-06-30 MED ORDER — VANCOMYCIN HCL 1750 MG/350ML IV SOLN
1750.0000 mg | Freq: Once | INTRAVENOUS | Status: AC
  Administered 2024-06-30: 1750 mg via INTRAVENOUS
  Filled 2024-06-30: qty 350

## 2024-06-30 MED ORDER — ATORVASTATIN CALCIUM 40 MG PO TABS
40.0000 mg | ORAL_TABLET | Freq: Every day | ORAL | Status: DC
  Administered 2024-07-01 – 2024-07-03 (×3): 40 mg via ORAL
  Filled 2024-06-30 (×4): qty 1

## 2024-06-30 MED ORDER — METOPROLOL SUCCINATE ER 25 MG PO TB24
12.5000 mg | ORAL_TABLET | Freq: Every day | ORAL | Status: DC
  Administered 2024-07-01 – 2024-07-03 (×3): 12.5 mg via ORAL
  Filled 2024-06-30 (×3): qty 1

## 2024-06-30 MED ORDER — ONDANSETRON HCL 4 MG PO TABS: 4.0000 mg | ORAL_TABLET | Freq: Four times a day (QID) | ORAL | Status: DC | PRN

## 2024-06-30 MED ORDER — SODIUM CHLORIDE 0.9 % IV SOLN: 250.0000 mL | INTRAVENOUS | Status: AC | PRN

## 2024-06-30 MED ORDER — POTASSIUM CHLORIDE 10 MEQ/100ML IV SOLN: 10.0000 meq | Freq: Once | INTRAVENOUS | Status: DC

## 2024-06-30 MED ORDER — SODIUM CHLORIDE 0.9 % IV SOLN
2.0000 g | Freq: Two times a day (BID) | INTRAVENOUS | Status: DC
  Administered 2024-06-30 – 2024-07-03 (×6): 2 g via INTRAVENOUS
  Filled 2024-06-30 (×6): qty 12.5

## 2024-06-30 MED ORDER — CLOPIDOGREL BISULFATE 75 MG PO TABS
75.0000 mg | ORAL_TABLET | Freq: Every day | ORAL | Status: DC
  Administered 2024-07-01 – 2024-07-03 (×3): 75 mg via ORAL
  Filled 2024-06-30 (×3): qty 1

## 2024-06-30 MED ORDER — DORZOLAMIDE HCL-TIMOLOL MAL 2-0.5 % OP SOLN
1.0000 [drp] | Freq: Every day | OPHTHALMIC | Status: DC
  Administered 2024-07-01 – 2024-07-03 (×3): 1 [drp] via OPHTHALMIC
  Filled 2024-06-30: qty 10

## 2024-06-30 MED ORDER — OXYCODONE HCL ER 10 MG PO T12A
10.0000 mg | EXTENDED_RELEASE_TABLET | Freq: Two times a day (BID) | ORAL | Status: DC
  Administered 2024-07-01 – 2024-07-03 (×5): 10 mg via ORAL
  Filled 2024-06-30 (×5): qty 1

## 2024-06-30 MED ORDER — VANCOMYCIN HCL 1500 MG/300ML IV SOLN: 1500.0000 mg | INTRAVENOUS | Status: DC

## 2024-06-30 MED ORDER — CHLORHEXIDINE GLUCONATE CLOTH 2 % EX PADS
6.0000 | MEDICATED_PAD | Freq: Every day | CUTANEOUS | Status: DC
  Administered 2024-06-30 – 2024-07-03 (×4): 6 via TOPICAL

## 2024-06-30 MED ORDER — SENNOSIDES-DOCUSATE SODIUM 8.6-50 MG PO TABS: 1.0000 | ORAL_TABLET | Freq: Two times a day (BID) | ORAL | Status: DC

## 2024-06-30 MED ORDER — HYDRALAZINE HCL 20 MG/ML IJ SOLN: 10.0000 mg | Freq: Four times a day (QID) | INTRAMUSCULAR | Status: DC | PRN

## 2024-06-30 MED ORDER — SORBITOL 70 % SOLN
30.0000 mL | Freq: Every day | Status: DC | PRN
Start: 2024-06-30 — End: 2024-07-03

## 2024-06-30 MED ORDER — ACETAMINOPHEN 325 MG PO TABS
650.0000 mg | ORAL_TABLET | Freq: Four times a day (QID) | ORAL | Status: DC | PRN
  Administered 2024-06-30: 650 mg via ORAL
  Filled 2024-06-30: qty 2

## 2024-06-30 MED ORDER — SODIUM CHLORIDE 0.9% FLUSH: 3.0000 mL | INTRAVENOUS | Status: DC | PRN

## 2024-06-30 MED ORDER — OXYCODONE HCL 5 MG PO TABS
10.0000 mg | ORAL_TABLET | Freq: Four times a day (QID) | ORAL | Status: DC | PRN
  Administered 2024-07-01: 10 mg via ORAL
  Filled 2024-06-30: qty 2

## 2024-06-30 MED ORDER — PANTOPRAZOLE SODIUM 40 MG PO TBEC: 40.0000 mg | DELAYED_RELEASE_TABLET | Freq: Every day | ORAL | Status: DC

## 2024-06-30 MED ORDER — BUDESON-GLYCOPYRROL-FORMOTEROL 160-9-4.8 MCG/ACT IN AERO
2.0000 | INHALATION_SPRAY | Freq: Two times a day (BID) | RESPIRATORY_TRACT | Status: DC
  Administered 2024-06-30: 2 via RESPIRATORY_TRACT
  Filled 2024-06-30: qty 5.9

## 2024-06-30 MED ORDER — POTASSIUM CHLORIDE 10 MEQ/100ML IV SOLN
10.0000 meq | Freq: Once | INTRAVENOUS | Status: AC
  Administered 2024-06-30: 10 meq via INTRAVENOUS
  Filled 2024-06-30: qty 100

## 2024-06-30 MED ORDER — METHOCARBAMOL 1000 MG/10ML IJ SOLN
500.0000 mg | Freq: Four times a day (QID) | INTRAMUSCULAR | Status: DC | PRN
Start: 2024-06-30 — End: 2024-07-03
  Administered 2024-06-30: 500 mg via INTRAVENOUS
  Filled 2024-06-30: qty 10

## 2024-06-30 MED ORDER — TORSEMIDE 20 MG PO TABS
20.0000 mg | ORAL_TABLET | Freq: Every day | ORAL | Status: DC
  Administered 2024-07-01 – 2024-07-02 (×2): 20 mg via ORAL
  Filled 2024-06-30 (×2): qty 1

## 2024-06-30 MED ORDER — ROPINIROLE HCL 1 MG PO TABS
1.0000 mg | ORAL_TABLET | Freq: Three times a day (TID) | ORAL | Status: DC
  Administered 2024-06-30 – 2024-07-03 (×9): 1 mg via ORAL
  Filled 2024-06-30 (×9): qty 1

## 2024-06-30 MED ORDER — VITAMIN D 25 MCG (1000 UNIT) PO TABS
1000.0000 [IU] | ORAL_TABLET | Freq: Every day | ORAL | Status: DC
  Administered 2024-07-01 – 2024-07-03 (×3): 1000 [IU] via ORAL
  Filled 2024-06-30 (×3): qty 1

## 2024-06-30 MED ORDER — HYDROMORPHONE HCL 1 MG/ML IJ SOLN: 1.0000 mg | INTRAMUSCULAR | Status: DC | PRN

## 2024-06-30 MED ORDER — ONDANSETRON HCL 4 MG/2ML IJ SOLN: 4.0000 mg | Freq: Four times a day (QID) | INTRAMUSCULAR | Status: DC | PRN

## 2024-06-30 MED ORDER — FILGRASTIM-AAFI 480 MCG/0.8ML IJ SOSY
480.0000 ug | PREFILLED_SYRINGE | Freq: Every day | INTRAMUSCULAR | Status: DC
  Administered 2024-06-30 – 2024-07-01 (×2): 480 ug via SUBCUTANEOUS
  Filled 2024-06-30 (×4): qty 0.8

## 2024-06-30 MED ORDER — ACETAMINOPHEN 650 MG RE SUPP
650.0000 mg | Freq: Four times a day (QID) | RECTAL | Status: DC | PRN
Start: 2024-06-30 — End: 2024-07-01

## 2024-06-30 MED ORDER — SACUBITRIL-VALSARTAN 24-26 MG PO TABS
1.0000 | ORAL_TABLET | Freq: Two times a day (BID) | ORAL | Status: DC
  Administered 2024-06-30 – 2024-07-03 (×6): 1 via ORAL
  Filled 2024-06-30 (×6): qty 1

## 2024-06-30 MED ORDER — LEVALBUTEROL HCL 0.63 MG/3ML IN NEBU
0.6300 mg | INHALATION_SOLUTION | RESPIRATORY_TRACT | Status: DC | PRN
  Administered 2024-06-30: 0.63 mg via RESPIRATORY_TRACT
  Filled 2024-06-30: qty 3

## 2024-06-30 MED ORDER — POTASSIUM CHLORIDE CRYS ER 10 MEQ PO TBCR
40.0000 meq | EXTENDED_RELEASE_TABLET | Freq: Three times a day (TID) | ORAL | Status: AC
  Administered 2024-06-30 – 2024-07-01 (×2): 40 meq via ORAL
  Filled 2024-06-30 (×2): qty 4

## 2024-06-30 MED ORDER — MAGNESIUM SULFATE 2 GM/50ML IV SOLN
2.0000 g | Freq: Once | INTRAVENOUS | Status: AC
  Administered 2024-06-30: 2 g via INTRAVENOUS
  Filled 2024-06-30: qty 50

## 2024-06-30 NOTE — Progress Notes (Signed)
 Clinical Summary Joshua Compton is a 83 y.o.male seen today for follow up of the following medical problems.    1.PAF - new diagnosis during 08/2023 admission - - patient with recent CVA, afib possible related  - started on oral amio to try to restore SR in setting of declining LVEF.    - no recent palpitations - no bleeding on eliquis .      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 Also with pneumonia this admission, possible stress induced CM. CT chest this admit did show severe LM and 3 vessel disease as well, risk for ischemic CM. - Would let him recover from recent stroke prior to considering an ischemic evaluation.       Jan 2025 echo: LVEF 45-50%, indet diastolic - no SOB/DOE, LE edema fairly well controlled.  - pcp had changed lasix  to torsemide  60mg    - 05/2024 echo: LVEF 50-55%, grade I dd - recent leg swelling, some SOB.  - had been on torsemide  60mg  daily, discontinued on admission.    3. CAD/NSTEMI - admit 05/2024 with chest pain, NSTEMI - 05/2024 cath: LM patent, LAD mid 70%, mid LCX 80% and distal 90%, RCA normal, RPDA 50%. DES to LCX - 05/2024 echo: LVEF 50-55%, grade I dd  - compliant with meds, still taking ASA - no chest pains. Some ongoing SOB.   3. History of CVA  5. Metastatic adenocarcinoma unknown primary - followed by oncology   6. Thyroid  mass - pathology consistent with malignancy. Plans for thyroid  lobectomy but postponed due to recent MI  6. COPD - has home O2, 2L at home. Sats usually 70s to 90s.  - some cough, some wheezing.  - not using trelegy   Past Medical History:  Diagnosis Date   Acute heart failure with preserved ejection fraction (HFpEF) (HCC) 09/05/2023   Acute HFrEF (heart failure with reduced ejection fraction) (HCC) 09/08/2023   Acute respiratory failure with hypoxia (HCC) 09/05/2023    Allergy    Arthritis    Cancer (HCC)    prostate ca   seed implants   CHF (congestive heart failure) (HCC)    GERD (gastroesophageal reflux disease)    Glaucoma    Hyperlipidemia    Hypertension    dr cyrena     [place]   fm   Stroke [facility]) 07/2023     Allergies  Allergen Reactions   Iodinated Contrast Media Anaphylaxis    Tongue swelled up when he had MRI in [facility]  Intubated for airway protection after oral and throat swelling   Pravastatin  Other (See Comments)    Muscle aches     Current Outpatient Medications  Medication Sig Dispense Refill   amiodarone  (PACERONE ) 200 MG tablet Take 1 tablet (200 mg total) by mouth daily. 90 tablet 3   apixaban  (ELIQUIS ) 5 MG TABS tablet Take 1 tablet (5 mg total) by mouth 2 (two) times daily. 60 tablet 0   aspirin  81 MG chewable tablet Chew 1 tablet (81 mg total) by mouth daily. 30 tablet 0   atorvastatin  (LIPITOR) 40 MG tablet Take 1 tablet (40 mg total) by mouth daily. 90 tablet 1   cholecalciferol  (VITAMIN D3) 25 MCG (1000 UNIT) tablet TAKE 1 TABLET BY MOUTH EVERY DAY 90 tablet 1   clopidogrel  (PLAVIX ) 75 MG tablet Take 1 tablet (75 mg total) by  mouth daily with breakfast. 30 tablet 0   dorzolamide -timolol  (COSOPT ) 22.3-6.8 MG/ML ophthalmic solution Place 1 drop into both eyes daily.      Fluticasone -Umeclidin-Vilant (TRELEGY ELLIPTA ) 100-62.5-25 MCG/ACT AEPB Inhale 1 puff into the lungs daily. (Patient not taking: Reported on 06/09/2024) 28 each 5   lidocaine -prilocaine  (EMLA ) cream Apply to affected area once 30 g 3   metoprolol  succinate (TOPROL -XL) 25 MG 24 hr tablet TAKE 0.5 TABLETS (12.5 MG TOTAL) BY MOUTH DAILY. FOR BLOOD PRESSURE CONTROL 45 tablet 1   ondansetron  (ZOFRAN ) 8 MG tablet Take 1 tablet (8 mg total) by mouth every 8 (eight) hours as needed for nausea or vomiting. Start on the third day after chemotherapy. 30 tablet 1   oxyCODONE  ER (XTAMPZA  ER) 9 MG C12A Take 9 mg by mouth every 12 (twelve) hours as needed. 60  capsule 0   Oxycodone  HCl 10 MG TABS Take 1 tablet (10 mg total) by mouth every 6 (six) hours as needed for up to 120 doses. 120 tablet 0   pantoprazole  (PROTONIX ) 40 MG tablet Take 1 tablet (40 mg total) by mouth daily. For heartburn 90 tablet 3   prochlorperazine  (COMPAZINE ) 10 MG tablet Take 1 tablet (10 mg total) by mouth every 6 (six) hours as needed for nausea or vomiting. 30 tablet 1   rOPINIRole  (REQUIP ) 1 MG tablet Take 1 tablet (1 mg total) by mouth 3 (three) times daily. Plus an extra tablet at bedtime to total four a day 360 tablet 3   sacubitril -valsartan  (ENTRESTO ) 24-26 MG Take 1 tablet by mouth 2 (two) times daily. 60 tablet 3   No current facility-administered medications for this visit.     Past Surgical History:  Procedure Laterality Date   BACK SURGERY     x2   COLONOSCOPY     CORONARY IMAGING/OCT N/A 06/13/2024   Procedure: CORONARY IMAGING/OCT;  Surgeon: Anner Alm ORN, MD;  Location: [facility] INVASIVE CV LAB;  Service: Cardiovascular;  Laterality: N/A;   CORONARY LITHOTRIPSY N/A 06/13/2024   Procedure: CORONARY LITHOTRIPSY;  Surgeon: Anner Alm ORN, MD;  Location: [facility] INVASIVE CV LAB;  Service: Cardiovascular;  Laterality: N/A;   CORONARY STENT INTERVENTION N/A 06/13/2024   Procedure: CORONARY STENT INTERVENTION;  Surgeon: Anner Alm ORN, MD;  Location: [facility] INVASIVE CV LAB;  Service: Cardiovascular;  Laterality: N/A;   EYE SURGERY     cataract   HERNIA REPAIR  11/06/2009   left inguinal repair    IR IMAGING GUIDED PORT INSERTION  06/20/2024   LEFT HEART CATH AND CORONARY ANGIOGRAPHY N/A 06/13/2024   Procedure: LEFT HEART CATH AND CORONARY ANGIOGRAPHY;  Surgeon: Anner Alm ORN, MD;  Location: [facility] INVASIVE CV LAB;  Service: Cardiovascular;  Laterality: N/A;   NECK SURGERY     ROTATOR CUFF REPAIR Left 11/2014   TOTAL HIP ARTHROPLASTY Right 04/20/2020   Procedure: RIGHT TOTAL HIP ARTHROPLASTY ANTERIOR APPROACH;  Surgeon: Jerri Kay HERO, MD;  Location: [facility] OR;  Service:  Orthopedics;  Laterality: Right;   TOTAL HIP ARTHROPLASTY Left 01/07/2021   Procedure: LEFT TOTAL HIP ARTHROPLASTY ANTERIOR APPROACH;  Surgeon: Jerri Kay HERO, MD;  Location: [facility] OR;  Service: Orthopedics;  Laterality: Left;   TRANSURETHRAL RESECTION OF PROSTATE       Allergies  Allergen Reactions   Iodinated Contrast Media Anaphylaxis    Tongue swelled up when he had MRI in [facility]  Intubated for airway protection after oral and throat swelling   Pravastatin  Other (See Comments)    Muscle aches  Family History  Problem Relation Age of Onset   Cancer Mother        breast   Heart disease Father    Stroke Sister    Hypertension Sister    Healthy Sister    Heart attack Sister    Other Sister        unknown cause   Cancer Sister    Other Brother        multiple back surgeries   Hypertension Brother    Throat cancer Brother    Cancer Brother        groin related cancer   Heart attack Brother    Atrial fibrillation Daughter    Insurance account manager Parkinson White syndrome Daughter    Cancer Son        luekimia   Heart disease Paternal Uncle    Other Brother        suicide-   Colon cancer Neg Hx    Rectal cancer Neg Hx    Stomach cancer Neg Hx      Social History Joshua Compton reports that he quit smoking about 19 years ago. His smoking use included cigarettes. He started smoking about 49 years ago. He has a 15 pack-year smoking history. He has never used smokeless tobacco. Joshua Compton reports no history of alcohol use.    Physical Examination Today's Vitals   06/30/24 0902  BP: (!) 124/58  SpO2: 90%  Weight: 189 lb 9.6 oz (86 kg)  Height: 6' (1.829 m)  PainSc: 10-Worst pain ever  PainLoc: Buttocks   Body mass index is 25.71 kg/m.  Gen: resting comfortably, no acute distress HEENT: no scleral icterus, pupils equal round and reactive, no palptable cervical adenopathy,  CV: RRR, no m/rg, no jvd Resp: decreased breath sounds bilateral bases, mild crackles GI:  abdomen is soft, non-tender, non-distended, normal bowel sounds, no hepatosplenomegaly MSK: extremities are warm, 1+ bilateral LE edema Skin: warm, no rash Neuro:  no focal deficits Psych: appropriate affect   Diagnostic Studies  Jan 2025 echo 1. Left ventricular ejection fraction, by estimation, is 45 to 50%. The  left ventricle has mildly decreased function. The left ventricle  demonstrates global hypokinesis. Left ventricular diastolic parameters are  indeterminate.   2. Right ventricular systolic function is normal. The right ventricular  size is normal. Tricuspid regurgitation signal is inadequate for assessing  PA pressure.   3. Left atrial size was moderately dilated.   4. The mitral valve is normal in structure. Trivial mitral valve  regurgitation. No evidence of mitral stenosis.   5. The aortic valve has an indeterminant number of cusps. There is mild  calcification of the aortic valve. There is mild thickening of the aortic  valve. Aortic valve regurgitation is mild. No aortic stenosis is present.   6. The inferior vena cava is dilated in size with >50% respiratory  variability, suggesting right atrial pressure of 8 mmHg.    Assessment and Plan   1.PAF/acquired thrombophilia - no symptoms, continue current meds   2.Chronic HFimpEF - volume overloaded today, start back torsemide  20mg  daily. Reassess volume status 3-4 weeks, has labs regularly with cancer cancer, follow results - continue medical therapy   3.CAD - recent NSTEMI with PCI as reported above - triple therapy x 1 week, he will stop asa and continue eliquis  and plavix  - thyroid  surgery postponed, would need to wait at least 3 months before could consider holding plavix   4.COPD - encouraged to restart his home trelegy - he is  on home O2 since hospital discharge.      Dorn PHEBE Ross, M.D.

## 2024-06-30 NOTE — Plan of Care (Signed)

## 2024-06-30 NOTE — Progress Notes (Signed)
 CRITICAL VALUE STICKER  CRITICAL VALUE: WBC 0.2 K/uL  RECEIVER (on-site recipient of call): Elenor Blush RN  DATE & TIME NOTIFIED: 06/30/24 & 14:44  MESSENGER (representative from lab): Powell Beat   MD NOTIFIED: Mallie Combes PA-C  TIME OF NOTIFICATION: 14:46

## 2024-06-30 NOTE — Progress Notes (Signed)
 Pharmacy Antibiotic Note  Joshua Compton. is a 83 y.o. male with metastatic adenocarcinoma involving the bones and muscle (primary unknown) on chemotherapy treatment (received chemo on 06/24/24) who was instructed by his oncologist to come to the ED on 06/30/24 for management of pancytopenia and febrile neutropenia.  Pharmacy has been consulted to dose vancomycin  and cefepime  for febrile Neutropenia.  Today, 06/30/2024: - ANC = 0 - scr 1.04 (crcl~59)  Plan: - cefepime  2 gm q12h - vancomycin  1750 mg IV x1, then 1500 mg q24h for est AUC 453  ____________________________________  Height: 6' (182.9 cm) Weight: 85.6 kg (188 lb 11.4 oz) IBW/kg (Calculated) : 77.6  Temp (24hrs), Avg:99.9 F (37.7 C), Min:99.6 F (37.6 C), Max:100.1 F (37.8 C)  Recent Labs  Lab 06/30/24 1424  WBC 0.2*  CREATININE 1.04    Estimated Creatinine Clearance: 59.1 mL/min (by C-G formula based on SCr of 1.04 mg/dL).    Allergies  Allergen Reactions   Iodinated Contrast Media Anaphylaxis    Tongue swelled up when he had MRI in [facility]  Intubated for airway protection after oral and throat swelling   Pravastatin  Other (See Comments)    Muscle aches     Thank you for allowing pharmacy to be a part of this patient's care.  Joshua Compton 06/30/2024 7:07 PM

## 2024-06-30 NOTE — Patient Instructions (Signed)
 Medication Instructions:   Stop Aspirin  Begin Torsemide  20mg  daily  Continue all other medications.     Labwork:  none  Testing/Procedures:  none  Follow-Up:  3-4 weeks    Any Other Special Instructions Will Be Listed Below (If Applicable).   If you need a refill on your cardiac medications before your next appointment, please call your pharmacy.

## 2024-06-30 NOTE — Progress Notes (Addendum)
 Symptom Management Consult Note [facility]    Patient Care Team: [name], MD as PCP - General (Family Medicine) Alvan, Dorn FALCON, MD as PCP - Cardiology (Cardiology) Kristie Lamprey, MD as Consulting Physician (Gastroenterology) Alix Charleston, MD as Consulting Physician (Neurosurgery) Watt Rush, MD as Attending Physician (Urology) Cleatus Collar, MD as Consulting Physician (Ophthalmology) Watt Rush, MD as Attending Physician (Urology) Golden Forestine BROCKS, RN as Oncology Nurse Navigator (Medical Oncology) Neomi Johnston ONEIDA DEVONNA as Physician Assistant (Hematology and Oncology)    Name / MRN / DOB: Joshua Compton  [id number]  March 08, 1941   Date of visit: 06/30/2024   Chief Complaint/Reason for visit: fever   Current Therapy: carboplatin  and taxol   Last treatment:  Day 1   Cycle 1 on 06/24/24    ASSESSMENT AND PLAN Patient is a 83 y.o. 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 Federico.  I have viewed most recent oncology note and lab work.  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 chest xray showing possible pneumonia vs aspiration vs atelectasis.  -Discussed with Dr. Federico who agrees with plan for admission  for neutropenic fever with low grade temperature today and fever at home based on results from work up so far. UA in process, urine culture also collected. Two sets of blood cultures were ordered. Phlebotomy tried x 4 and was only able to obtain one set. Hospitalist updated. - Hemoglobin 8.4 and platelets 67k, likely treatment related.  - Unable to start antibiotics in clinic as there is a signifcant delay getting them from the main hospital pharmacy.  #Hypokalemia - Potassium 2.9 today. Has potassium pills at home  however on recent hospital discharge it was not listed on his medication list so daughter had not been giving. - Can start replacement in clinic, 10 mEq ordered IV. Will also add on magnesium  level   Spoke with Dr. Donnamarie with hospitalist service who agrees to assume care of patient and bring into the hospital for further evaluation and management.     HEME/ONC HISTORY Oncology History  Adenocarcinoma of unknown primary (HCC)  06/09/2024 Initial Diagnosis   Metastatic adenocarcinoma (HCC)   06/24/2024 -  Chemotherapy   Patient is on Treatment Plan : Carboplatin /Paclitaxel  Q 3 weeks, Unknown PRimary         INTERVAL HISTORY  Discussed the use of AI scribe software for clinical note transcription with the patient, who gave verbal consent to proceed.    Joshua Compton. is a 83 y.o. male with oncologic history as above presenting to [facility] today with chief complaint of fever. Patient is accompanied by his daughters who provide additional history.  He developed a fever of 100.73F  x 3 days ago which was treated with Tylenol . He has continued to have subjective fever. He also experienced 1 episode of diarrhea x 3 days ago. Since then, he has developed urinary incontinence characterized by urgency and unawareness, with at least one episode of unawareness reported.  Since the night of August 11th, he has experienced increased urinary frequency. There is no significant history of urinary tract infections. His urine is dark in color with a strong odor. Fluid intake has decreased, with consumption of about one large hospital cup of water  per day.  He has been experiencing new confusion, characterized by nonsensical  speech and misidentification of family members.  He has a persistent cough that predates his recent treatment but has worsened, with bright red sputum noted. He has been using home oxygen  but lacks portable oxygen  for outings, resulting in oxygen  saturation dropping to 74% during a  recent doctor's visit. Shortness of breath is primarily noticeable with activity, and he remains mostly at home since his treatment. Plan per cardiology note today was start back torsemide  20 mg today as he is volume overloaded.     ROS  All other systems are reviewed and are negative for acute change except as noted in the HPI.    Allergies  Allergen Reactions   Iodinated Contrast Media Anaphylaxis    Tongue swelled up when he had [facility]  Intubated for airway protection after oral and throat swelling   Pravastatin  Other (See Comments)    Muscle aches     Past Medical History:  Diagnosis Date   Acute heart failure with preserved ejection fraction (HFpEF) (HCC) 09/05/2023   Acute HFrEF (heart failure with reduced ejection fraction) (HCC) 09/08/2023   Acute respiratory failure with hypoxia (HCC) 09/05/2023   Allergy    Arthritis    Cancer (HCC)    prostate ca   seed implants   CHF (congestive heart failure) (HCC)    GERD (gastroesophageal reflux disease)    Glaucoma    Hyperlipidemia    Hypertension    dr cyrena     [place]   fm   Stroke [facility]) 07/2023     Past Surgical History:  Procedure Laterality Date   BACK SURGERY     x2   COLONOSCOPY     CORONARY IMAGING/OCT N/A 06/13/2024   Procedure: CORONARY IMAGING/OCT;  Surgeon: Anner Alm ORN, MD;  Location: [facility] INVASIVE CV LAB;  Service: Cardiovascular;  Laterality: N/A;   CORONARY LITHOTRIPSY N/A 06/13/2024   Procedure: CORONARY LITHOTRIPSY;  Surgeon: Anner Alm ORN, MD;  Location: [facility] INVASIVE CV LAB;  Service: Cardiovascular;  Laterality: N/A;   CORONARY STENT INTERVENTION N/A 06/13/2024   Procedure: CORONARY STENT INTERVENTION;  Surgeon: Anner Alm ORN, MD;  Location: [facility] INVASIVE CV LAB;  Service: Cardiovascular;  Laterality: N/A;   EYE SURGERY     cataract   HERNIA REPAIR  11/06/2009   left inguinal repair    IR IMAGING GUIDED PORT INSERTION  06/20/2024   LEFT HEART CATH AND CORONARY ANGIOGRAPHY N/A  06/13/2024   Procedure: LEFT HEART CATH AND CORONARY ANGIOGRAPHY;  Surgeon: Anner Alm ORN, MD;  Location: [facility] INVASIVE CV LAB;  Service: Cardiovascular;  Laterality: N/A;   NECK SURGERY     ROTATOR CUFF REPAIR Left 11/2014   TOTAL HIP ARTHROPLASTY Right 04/20/2020   Procedure: RIGHT TOTAL HIP ARTHROPLASTY ANTERIOR APPROACH;  Surgeon: Jerri Kay HERO, MD;  Location: [facility] OR;  Service: Orthopedics;  Laterality: Right;   TOTAL HIP ARTHROPLASTY Left 01/07/2021   Procedure: LEFT TOTAL HIP ARTHROPLASTY ANTERIOR APPROACH;  Surgeon: Jerri Kay HERO, MD;  Location: [facility] OR;  Service: Orthopedics;  Laterality: Left;   TRANSURETHRAL RESECTION OF PROSTATE      Social History   Socioeconomic History   Marital status: Married    Spouse name: Not on file   Number of children: 5   Years of education: 11   Highest education level: 11th grade  Occupational History   Occupation: Nutritional therapist    Comment: retired  Tobacco Use   Smoking status: Former    Current packs/day: 0.00    Average  packs/day: 0.5 packs/day for 30.0 years (15.0 ttl pk-yrs)    Types: Cigarettes    Start date: 03/29/1975    Quit date: 03/28/2005    Years since quitting: 19.2   Smokeless tobacco: Never  Vaping Use   Vaping status: Never Used  Substance and Sexual Activity   Alcohol use: No   Drug use: Never   Sexual activity: Not Currently    Birth control/protection: None  Other Topics Concern   Not on file  Social History Narrative   Married. 1 daughter and 1 son from current marriage. 3 daughters from prior marriage. 10 grandkids      Retired Proofreader: fishing, golf, enjoys being outdoors   Social Drivers of Corporate investment banker Strain: Low Risk  (05/26/2024)   Overall Financial Resource Strain (CARDIA)    Difficulty of Paying Living Expenses: Not hard at all  Food Insecurity: No Food Insecurity (06/09/2024)   Hunger Vital Sign    Worried About Running Out of Food in  the Last Year: Never true    Ran Out of Food in the Last Year: Never true  Transportation Needs: No Transportation Needs (06/09/2024)   PRAPARE - Administrator, Civil Service (Medical): No    Lack of Transportation (Non-Medical): No  Physical Activity: Inactive (05/26/2024)   Exercise Vital Sign    Days of Exercise per Week: 0 days    Minutes of Exercise per Session: Not on file  Stress: No Stress Concern Present (05/26/2024)   [employer] of Occupational Health - Occupational Stress Questionnaire    Feeling of Stress: Not at all  Social Connections: Socially Integrated (06/09/2024)   Social Connection and Isolation Panel    Frequency of Communication with Friends and Family: More than three times a week    Frequency of Social Gatherings with Friends and Family: Twice a week    Attends Religious Services: More than 4 times per year    Active Member of [employer] or Organizations: Yes    Attends Banker Meetings: 1 to 4 times per year    Marital Status: Married  Recent Concern: Social Connections - Moderately Isolated (05/26/2024)   Social Connection and Isolation Panel    Frequency of Communication with Friends and Family: Once a week    Frequency of Social Gatherings with Friends and Family: Once a week    Attends Religious Services: Patient declined    Database administrator or Organizations: Yes    Attends Banker Meetings: 1 to 4 times per year    Marital Status: Married  Catering manager Violence: Not At Risk (06/09/2024)   Humiliation, Afraid, Rape, and Kick questionnaire    Fear of Current or Ex-Partner: No    Emotionally Abused: No    Physically Abused: No    Sexually Abused: No    Family History  Problem Relation Age of Onset   Cancer Mother        breast   Heart disease Father    Stroke Sister    Hypertension Sister    Healthy Sister    Heart attack Sister    Other Sister        unknown cause   Cancer Sister    Other Brother         multiple back surgeries   Hypertension Brother    Throat cancer Brother    Cancer Brother  groin related cancer   Heart attack Brother    Atrial fibrillation Daughter    Kassie Pour White syndrome Daughter    Cancer Son        luekimia   Heart disease Paternal Uncle    Other Brother        suicide-   Colon cancer Neg Hx    Rectal cancer Neg Hx    Stomach cancer Neg Hx      Current Outpatient Medications:    amiodarone  (PACERONE ) 200 MG tablet, Take 1 tablet (200 mg total) by mouth daily., Disp: 90 tablet, Rfl: 3   apixaban  (ELIQUIS ) 5 MG TABS tablet, Take 1 tablet (5 mg total) by mouth 2 (two) times daily., Disp: 60 tablet, Rfl: 0   atorvastatin  (LIPITOR) 40 MG tablet, Take 1 tablet (40 mg total) by mouth daily., Disp: 90 tablet, Rfl: 1   cholecalciferol  (VITAMIN D3) 25 MCG (1000 UNIT) tablet, TAKE 1 TABLET BY MOUTH EVERY DAY, Disp: 90 tablet, Rfl: 1   clopidogrel  (PLAVIX ) 75 MG tablet, Take 1 tablet (75 mg total) by mouth daily with breakfast., Disp: 30 tablet, Rfl: 0   dorzolamide -timolol  (COSOPT ) 22.3-6.8 MG/ML ophthalmic solution, Place 1 drop into both eyes daily. , Disp: , Rfl:    Fluticasone -Umeclidin-Vilant (TRELEGY ELLIPTA ) 100-62.5-25 MCG/ACT AEPB, Inhale 1 puff into the lungs daily., Disp: 28 each, Rfl: 5   lidocaine -prilocaine  (EMLA ) cream, Apply to affected area once, Disp: 30 g, Rfl: 3   metoprolol  succinate (TOPROL -XL) 25 MG 24 hr tablet, TAKE 0.5 TABLETS (12.5 MG TOTAL) BY MOUTH DAILY. FOR BLOOD PRESSURE CONTROL, Disp: 45 tablet, Rfl: 1   ondansetron  (ZOFRAN ) 8 MG tablet, Take 1 tablet (8 mg total) by mouth every 8 (eight) hours as needed for nausea or vomiting. Start on the third day after chemotherapy., Disp: 30 tablet, Rfl: 1   oxyCODONE  ER (XTAMPZA  ER) 9 MG C12A, Take 9 mg by mouth every 12 (twelve) hours as needed., Disp: 60 capsule, Rfl: 0   Oxycodone  HCl 10 MG TABS, Take 1 tablet (10 mg total) by mouth every 6 (six) hours as needed for up to 120  doses., Disp: 120 tablet, Rfl: 0   pantoprazole  (PROTONIX ) 40 MG tablet, Take 1 tablet (40 mg total) by mouth daily. For heartburn, Disp: 90 tablet, Rfl: 3   prochlorperazine  (COMPAZINE ) 10 MG tablet, Take 1 tablet (10 mg total) by mouth every 6 (six) hours as needed for nausea or vomiting., Disp: 30 tablet, Rfl: 1   rOPINIRole  (REQUIP ) 1 MG tablet, Take 1 tablet (1 mg total) by mouth 3 (three) times daily. Plus an extra tablet at bedtime to total four a day, Disp: 360 tablet, Rfl: 3   sacubitril -valsartan  (ENTRESTO ) 24-26 MG, Take 1 tablet by mouth 2 (two) times daily., Disp: 60 tablet, Rfl: 3   torsemide  (DEMADEX ) 20 MG tablet, Take 1 tablet (20 mg total) by mouth daily., Disp: 30 tablet, Rfl: 6 No current facility-administered medications for this visit.  Facility-Administered Medications Ordered in Other Visits:    potassium chloride  10 mEq in 100 mL IVPB, 10 mEq, Intravenous, Once, Thayil, Irene T, PA-C, Last Rate: 100 mL/hr at 06/30/24 1537, 10 mEq at 06/30/24 1537  PHYSICAL EXAM ECOG FS:2 - Symptomatic, <50% confined to bed    Vitals:   06/30/24 1406  BP: (!) 150/51  Pulse: 86  Resp: 17  Temp: 100.1 F (37.8 C)  TempSrc: Temporal  SpO2: 97%  Weight: 189 lb (85.7 kg)  Height: 6' (1.829 m)  Physical Exam Vitals and nursing note reviewed.  Constitutional:      Appearance: He is not ill-appearing or toxic-appearing.  HENT:     Head: Normocephalic.     Mouth/Throat:     Mouth: Mucous membranes are dry.  Eyes:     Conjunctiva/sclera: Conjunctivae normal.  Cardiovascular:     Rate and Rhythm: Normal rate and regular rhythm.     Pulses: Normal pulses.     Heart sounds: Normal heart sounds.  Pulmonary:     Comments: Tachypneic. Decreased breath sound in bilateral bases. Abdominal:     General: There is no distension.     Tenderness: There is no abdominal tenderness.  Musculoskeletal:     Cervical back: Normal range of motion.  Skin:    General: Skin is warm and dry.   Neurological:     Mental Status: He is alert and oriented to person, place, and time.        LABORATORY DATA I have reviewed the data as listed    Latest Ref Rng & Units 06/30/2024    2:24 PM 06/21/2024    1:29 PM 06/14/2024    4:37 AM  CBC  WBC 4.0 - 10.5 K/uL 0.2  7.1  6.9   Hemoglobin 13.0 - 17.0 g/dL 8.4  89.5  87.8   Hematocrit 39.0 - 52.0 % 24.8  31.9  36.7   Platelets 150 - 400 K/uL 67  158  197         Latest Ref Rng & Units 06/30/2024    2:24 PM 06/21/2024    1:29 PM 06/14/2024    4:37 AM  CMP  Glucose 70 - 99 mg/dL 890  94  896   BUN 8 - 23 mg/dL 16  15  23    Creatinine 0.61 - 1.24 mg/dL 8.95  8.92  8.74   Sodium 135 - 145 mmol/L 140  139  140   Potassium 3.5 - 5.1 mmol/L 2.9  3.1  4.0   Chloride 98 - 111 mmol/L 100  103  109   CO2 22 - 32 mmol/L 33  29  20   Calcium  8.9 - 10.3 mg/dL 8.5  9.1  9.1   Total Protein 6.5 - 8.1 g/dL 5.7  6.2    Total Bilirubin 0.0 - 1.2 mg/dL 1.1  1.0    Alkaline Phos 38 - 126 U/L 105  116    AST 15 - 41 U/L 45  23    ALT 0 - 44 U/L 57  22         RADIOGRAPHIC STUDIES (from last 24 hours if applicable) I have personally reviewed the radiological images as listed and agreed with the findings in the report. DG Chest 2 View Result Date: 06/30/2024 EXAM: 2 VIEW(S) XRAY OF THE CHEST 06/30/2024 01:41:00 PM COMPARISON: 06/09/2024 CLINICAL HISTORY: SOB \\T \ fever- pt also states he is having slight mid chest pain, coughing up phlegm - Adenocarcinoma of unknown primary ; Hx of PAF, CAD, recent NSTEMI, COPD FINDINGS: LINES AND TUBES: Right chest wall port tip terminates over the SVC. LUNGS AND PLEURA: Bibasilar patchy opacities. No pleural effusion. No pneumothorax. HEART AND MEDIASTINUM: Leftward deviation of the trachea, in keeping with known right thyroid  mass. Unchanged cardiomediastinal silhouette. BONES AND SOFT TISSUES: No acute osseous abnormality. IMPRESSION: 1. Bibasilar patchy opacities, which may represent atelectasis, aspiration, or  pneumonia. 2. Leftward deviation of the trachea, in keeping with known right thyroid  mass. 3. Unchanged cardiomediastinal silhouette. Electronically signed by:  Manford Cummins MD 06/30/2024 01:50 PM EDT RP Workstation: [id number]        Visit Diagnosis: 1. Hypokalemia   2. Fever, unspecified   3. Adenocarcinoma of unknown primary (HCC)   4. Other pancytopenia (HCC)      Orders Placed This Encounter  Procedures   Magnesium     Standing Status:   Future    Number of Occurrences:   1    Expiration Date:   06/30/2025    All questions were answered. The patient knows to call the clinic with any problems, questions or concerns. No barriers to learning was detected.  A total of more than 40 minutes were spent on this encounter with face-to-face time and non-face-to-face time, including preparing to see the patient, ordering tests and/or medications, counseling the patient and coordination of care as outlined above.    Thank you for allowing me to participate in the care of this patient.    Eddy Termine E  Walisiewicz, PA-C Department of Hematology/Oncology [facility] at [facility] Phone: [phone]  [phone]    06/30/2024 4:09 PM

## 2024-06-30 NOTE — Progress Notes (Signed)
 This RN and Mallie ORN PA-C transported Pt via w/c with O2 support to rm 1606.

## 2024-06-30 NOTE — Progress Notes (Signed)
 This RN called report to Armed forces operational officer for direct admission to room 1606 at [facility].

## 2024-06-30 NOTE — Progress Notes (Signed)
 CRITICAL VALUE STICKER  CRITICAL VALUE: ANC 0.0  RECEIVER (on-site recipient of call): Elenor Blush RN  DATE & TIME NOTIFIED: 06/30/24 & 15:33  MESSENGER (representative from lab): Powell Beat  MD NOTIFIED: Mallie Combes PA-C  TIME OF NOTIFICATION: 15:35

## 2024-06-30 NOTE — H&P (Signed)
 History and Physical    Joshua Compton. FMW:[id number] DOB: 1941-05-01 DOA: 06/30/2024  PCP: [name], MD  Patient coming from: From home via [facility]  I have personally briefly reviewed patient's old medical records in [facility] Link  Chief Complaint: Fever  HPI: Joshua Compton. is a 83 y.o. 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 Patient at time of interview drowsy and as such history obtained from daughters who are at bedside.  It is noted that patient had 3 days of fevers with temp as high as 100.8, an episode of diarrhea which has since resolved, productive cough of brownish sputum, some shortness of breath, urinary frequency, chills, complains of sore throat.  Patient and daughters deny any nausea, no vomiting, no chest pain, no abdominal pain, no constipation, no dysuria, no melena, no hematemesis, no hematochezia, no lightheadedness, no dizziness. Patient seen by his oncologist on day of admission labs obtained with a comprehensive metabolic profile with a potassium of 2.9, calcium  of 8.5, albumin  of 3.0, AST of 45, ALT of 57, total protein of 5.7, total bilirubin of 1.1 otherwise within normal limits.  Magnesium  noted at 1.8.  CBC with a white count of 0.2 with a ANC of 0.0, hemoglobin of 8.4, platelet count of 67.  Urinalysis done cloudy, moderate hemoglobin, nitrite negative, leukocytes negative, greater than 300 protein, many bacteria, 11-20 WBCs.  1 set of blood cultures was  obtained at the oncology center. Chest x-ray done with bibasilar patchy opacities which may represent atelectasis, aspiration or pneumonia.  Leftward deviation of the trachea, in keeping with known right thyroid  mass.  Unchanged cardiomediastinal silhouette. Patient's oncologist called for admission for further evaluation management and IV antibiotics.  Review of Systems: As per HPI otherwise all other systems reviewed and are negative.  Past Medical History:  Diagnosis Date   Acute heart failure with preserved ejection fraction (HFpEF) (HCC) 09/05/2023   Acute HFrEF (heart failure with reduced ejection fraction) (HCC) 09/08/2023   Acute respiratory failure with hypoxia (HCC) 09/05/2023   Allergy    Arthritis    Cancer (HCC)    prostate ca   seed implants   CHF (congestive heart failure) (HCC)    GERD (gastroesophageal reflux disease)    Glaucoma    Hyperlipidemia    Hypertension    dr cyrena     [place]   fm   Stroke [facility]) 07/2023    Past Surgical History:  Procedure Laterality Date   BACK SURGERY     x2   COLONOSCOPY     CORONARY IMAGING/OCT N/A 06/13/2024   Procedure: CORONARY IMAGING/OCT;  Surgeon: Anner Alm ORN, MD;  Location: [facility] INVASIVE CV LAB;  Service: Cardiovascular;  Laterality: N/A;   CORONARY LITHOTRIPSY N/A 06/13/2024   Procedure: CORONARY LITHOTRIPSY;  Surgeon: Anner Alm ORN, MD;  Location: [facility] INVASIVE CV LAB;  Service: Cardiovascular;  Laterality: N/A;   CORONARY STENT INTERVENTION N/A 06/13/2024   Procedure: CORONARY STENT INTERVENTION;  Surgeon: Anner Alm ORN, MD;  Location: [facility] INVASIVE CV LAB;  Service: Cardiovascular;  Laterality: N/A;  EYE SURGERY     cataract   HERNIA REPAIR  11/06/2009   left inguinal repair    IR IMAGING GUIDED PORT INSERTION  06/20/2024   LEFT HEART CATH AND CORONARY ANGIOGRAPHY N/A 06/13/2024   Procedure: LEFT HEART CATH AND CORONARY ANGIOGRAPHY;  Surgeon: Anner Alm ORN, MD;  Location: [facility] INVASIVE CV LAB;  Service:  Cardiovascular;  Laterality: N/A;   NECK SURGERY     ROTATOR CUFF REPAIR Left 11/2014   TOTAL HIP ARTHROPLASTY Right 04/20/2020   Procedure: RIGHT TOTAL HIP ARTHROPLASTY ANTERIOR APPROACH;  Surgeon: Jerri Kay HERO, MD;  Location: [facility] OR;  Service: Orthopedics;  Laterality: Right;   TOTAL HIP ARTHROPLASTY Left 01/07/2021   Procedure: LEFT TOTAL HIP ARTHROPLASTY ANTERIOR APPROACH;  Surgeon: Jerri Kay HERO, MD;  Location: [facility] OR;  Service: Orthopedics;  Laterality: Left;   TRANSURETHRAL RESECTION OF PROSTATE      Social History  reports that he quit smoking about 19 years ago. His smoking use included cigarettes. He started smoking about 49 years ago. He has a 15 pack-year smoking history. He has never used smokeless tobacco. He reports that he does not drink alcohol and does not use drugs.  Allergies  Allergen Reactions   Iodinated Contrast Media Anaphylaxis    Tongue swelled up when he had MRI in [facility]  Intubated for airway protection after oral and throat swelling   Pravastatin  Other (See Comments)    Muscle aches    Family History  Problem Relation Age of Onset   Cancer Mother        breast   Heart disease Father    Stroke Sister    Hypertension Sister    Healthy Sister    Heart attack Sister    Other Sister        unknown cause   Cancer Sister    Other Brother        multiple back surgeries   Hypertension Brother    Throat cancer Brother    Cancer Brother        groin related cancer   Heart attack Brother    Atrial fibrillation Daughter    Insurance account manager Parkinson White syndrome Daughter    Cancer Son        luekimia   Heart disease Paternal Uncle    Other Brother        suicide-   Colon cancer Neg Hx    Rectal cancer Neg Hx    Stomach cancer Neg Hx    Mother deceased in her 31s from breast cancer.  Father deceased in his 85s from an acute MI.  Prior to Admission medications   Medication Sig Start Date End Date Taking? Authorizing Provider  amiodarone  (PACERONE ) 200  MG tablet Take 1 tablet (200 mg total) by mouth daily. 02/25/24   Alvan Dorn FALCON, MD  apixaban  (ELIQUIS ) 5 MG TABS tablet Take 1 tablet (5 mg total) by mouth 2 (two) times daily. 06/14/24   Arrien, Elidia Sieving, MD  atorvastatin  (LIPITOR) 40 MG tablet Take 1 tablet (40 mg total) by mouth daily. 02/18/24   Zollie Lowers, MD  cholecalciferol  (VITAMIN D3) 25 MCG (1000 UNIT) tablet TAKE 1 TABLET BY MOUTH EVERY DAY 02/16/24   Zollie Lowers, MD  clopidogrel  (PLAVIX ) 75 MG tablet Take 1 tablet (75 mg total) by mouth daily with breakfast. 06/14/24 07/14/24  Arrien, Elidia Sieving, MD  dorzolamide -timolol  (COSOPT ) 22.3-6.8 MG/ML ophthalmic solution Place 1 drop into both eyes daily.  08/04/16   [provider]  Fluticasone -Umeclidin-Vilant (TRELEGY ELLIPTA ) 100-62.5-25 MCG/ACT AEPB Inhale 1 puff into the lungs daily. 01/22/24   Jude Harden GAILS, MD  lidocaine -prilocaine  (EMLA ) cream Apply to affected area once 06/16/24   Dorsey, John T IV, MD  metoprolol  succinate (TOPROL -XL) 25 MG 24 hr tablet TAKE 0.5 TABLETS (12.5 MG TOTAL) BY MOUTH DAILY. FOR BLOOD PRESSURE CONTROL 01/07/24   Alvan Dorn FALCON, MD  ondansetron  (ZOFRAN ) 8 MG tablet Take 1 tablet (8 mg total) by mouth every 8 (eight) hours as needed for nausea or vomiting. Start on the third day after chemotherapy. 06/16/24   Federico Norleen [name], MD  oxyCODONE  ER (XTAMPZA  ER) 9 MG C12A Take 9 mg by mouth every 12 (twelve) hours as needed. 05/26/24   Thayil, Irene T, PA-C  Oxycodone  HCl 10 MG TABS Take 1 tablet (10 mg total) by mouth every 6 (six) hours as needed for up to 120 doses. 05/02/24   Thayil, Irene T, PA-C  pantoprazole  (PROTONIX ) 40 MG tablet Take 1 tablet (40 mg total) by mouth daily. For heartburn 07/28/23   Zollie Lowers, MD  prochlorperazine  (COMPAZINE ) 10 MG tablet Take 1 tablet (10 mg total) by mouth every 6 (six) hours as needed for nausea or vomiting. 06/16/24   Dorsey, John T IV, MD  rOPINIRole  (REQUIP ) 1 MG tablet Take 1 tablet (1 mg total)  by mouth 3 (three) times daily. Plus an extra tablet at bedtime to total four a day 11/30/23   Zollie Lowers, MD  sacubitril -valsartan  (ENTRESTO ) 24-26 MG Take 1 tablet by mouth 2 (two) times daily. 04/05/24   Zollie Lowers, MD  torsemide  (DEMADEX ) 20 MG tablet Take 1 tablet (20 mg total) by mouth daily. 06/30/24   Alvan Dorn FALCON, MD    Physical Exam: Vitals:   06/30/24 1716 06/30/24 1807  BP: (!) 156/48   Pulse: 87   Resp: 20   Temp: 99.6 F (37.6 C)   TempSrc: Oral   SpO2: 99%   Weight:  85.6 kg  Height:  6' (1.829 m)    Constitutional: NAD, calm, comfortable Vitals:   06/30/24 1716 06/30/24 1807  BP: (!) 156/48   Pulse: 87   Resp: 20   Temp: 99.6 F (37.6 C)   TempSrc: Oral   SpO2: 99%   Weight:  85.6 kg  Height:  6' (1.829 m)   Eyes: PERRL, lids and conjunctivae normal ENMT: Mucous membranes are dry. Posterior pharynx clear of any exudate or lesions.Normal dentition.  Neck: normal, supple, no masses, no thyromegaly Respiratory: Some coarse breath sounds in the bases otherwise clear.  No wheezing, no crackles, normal respiratory effort.  No use of accessory muscles of respiration.  Cardiovascular: Regular rate and rhythm, no murmurs / rubs / gallops.  1+ bilateral lower extremity edema. 2+ pedal pulses. No carotid bruits.  Abdomen: no tenderness, no masses palpated. No hepatosplenomegaly. Bowel sounds positive.  Musculoskeletal: no clubbing / cyanosis. No joint deformity upper and lower extremities. Good ROM, no contractures. Normal muscle tone.  Skin: Bilateral lower extremities with chronic venous stasis changes, some erythema, some warmth, some tenderness to palpation, skin tear noted on the right lower extremity.  Neurologic: CN 2-12 grossly intact. Sensation intact, DTR normal. Strength 5/5 in all 4.  Psychiatric: Fair judgment and insight.  Drowsy but oriented x 3 and following commands appropriately.  Normal mood.   Labs on Admission: I have personally reviewed  following labs and imaging studies  CBC: Recent Labs  Lab 06/30/24 1424  WBC 0.2*  NEUTROABS  0.0*  HGB 8.4*  HCT 24.8*  MCV 89.5  PLT 67*    Basic Metabolic Panel: Recent Labs  Lab 06/30/24 1424  NA 140  K 2.9*  CL 100  CO2 33*  GLUCOSE 109*  BUN 16  CREATININE 1.04  CALCIUM  8.5*  MG 1.8    GFR: Estimated Creatinine Clearance: 59.1 mL/min (by C-G formula based on SCr of 1.04 mg/dL).  Liver Function Tests: Recent Labs  Lab 06/30/24 1424  AST 45*  ALT 57*  ALKPHOS 105  BILITOT 1.1  PROT 5.7*  ALBUMIN  3.0*    Urine analysis:    Component Value Date/Time   COLORURINE AMBER (A) 06/30/2024 1403   APPEARANCEUR CLOUDY (A) 06/30/2024 1403   APPEARANCEUR Clear 11/11/2017 1445   LABSPEC 1.018 06/30/2024 1403   PHURINE 5.0 06/30/2024 1403   GLUCOSEU NEGATIVE 06/30/2024 1403   HGBUR MODERATE (A) 06/30/2024 1403   BILIRUBINUR NEGATIVE 06/30/2024 1403   BILIRUBINUR Negative 11/11/2017 1445   KETONESUR NEGATIVE 06/30/2024 1403   PROTEINUR >=300 (A) 06/30/2024 1403   UROBILINOGEN 0.2 04/29/2013 0646   NITRITE NEGATIVE 06/30/2024 1403   LEUKOCYTESUR NEGATIVE 06/30/2024 1403    Radiological Exams on Admission: DG Chest 2 View Result Date: 06/30/2024 EXAM: 2 VIEW(S) XRAY OF THE CHEST 06/30/2024 01:41:00 PM COMPARISON: 06/09/2024 CLINICAL HISTORY: SOB \\T \ fever- pt also states he is having slight mid chest pain, coughing up phlegm - Adenocarcinoma of unknown primary ; Hx of PAF, CAD, recent NSTEMI, COPD FINDINGS: LINES AND TUBES: Right chest wall port tip terminates over the SVC. LUNGS AND PLEURA: Bibasilar patchy opacities. No pleural effusion. No pneumothorax. HEART AND MEDIASTINUM: Leftward deviation of the trachea, in keeping with known right thyroid  mass. Unchanged cardiomediastinal silhouette. BONES AND SOFT TISSUES: No acute osseous abnormality. IMPRESSION: 1. Bibasilar patchy opacities, which may represent atelectasis, aspiration, or pneumonia. 2. Leftward deviation  of the trachea, in keeping with known right thyroid  mass. 3. Unchanged cardiomediastinal silhouette. Electronically signed by: Manford Cummins MD 06/30/2024 01:50 PM EDT RP Workstation: [id number]    EKG: Not done  Assessment/Plan Principal Problem:   Neutropenic fever (HCC) Active Problems:   NSTEMI (non-ST elevated myocardial infarction) (HCC)   PAF (paroxysmal atrial fibrillation) (HCC)   Chronic kidney disease, stage 3a (HCC)   Chronic diastolic CHF (congestive heart failure) (HCC)   Centrilobular emphysema (HCC)   Adenocarcinoma of unknown primary (HCC)   History of CVA (cerebrovascular accident)   Gastroesophageal reflux disease with esophagitis without hemorrhage   Type 2 diabetes mellitus with hyperlipidemia (HCC)   Cerebrovascular accident (CVA) due to embolism of left middle cerebral artery (HCC)   Venous insufficiency of both lower extremities   Cancer, metastatic to bone (HCC)   Pancytopenia (HCC)    #1 neutropenic fever/?CAP -Patient presenting with drowsiness, fevers x 3 days, cough, shortness of breath and noted with a severe neutropenia with white count of 0.2 and ANC of 0.0. - Chest x-ray done with bibasilar patchy opacities which may represent atelectasis or pneumonia. - Patient with some lower extremity wounds with some redness and concern for possible cellulitis. - Urinalysis nitrite negative leukocyte negative. - 1 set of blood cultures was done at the cancer center. - Check blood cultures x 2, check a urine culture, check a sputum Gram stain and culture, check a urine Legionella antigen, check a urine pneumococcus antigen, check a procalcitonin level. -Check a respiratory viral panel, COVID-19 PCR, influenza A and B by PCR, RSV by PCR. -Place on G-CSF. - Place empirically on  IV vancomycin , IV cefepime  pending urine culture results. -  2.  Pancytopenia -Secondary to recent chemotherapy. - Patient being pancultured secondary to problem #1. - Patient noted to have  received G-CSF per family on Monday, 06/27/2024. - Patient noted to have started chemotherapy on 06/24/2024 and per daughters has completed 9 radiation treatments. -Panculture. - Place on G-CSF - Follow H&H and transfuse for hemoglobin < 8. - Monitor platelet counts. -Check anemia panel. - Patient's primary oncologist is aware of patient's admission and will also notify via epic. - Supportive care.  3.  Metastatic adenocarcinoma involving the bones and muscle, primary unknown -Patient noted to have recently started chemotherapy on 06/24/2024. - Per daughters at bedside patient has already completed 9 radiation treatments. -Per daughter his next chemotherapy treatment is on 07/15/2024. - Per oncology.  4.  Hypokalemia -Magnesium  noted at 1.8. - Potassium at 2.9. - K-Dur 40 mEq p.o. every 4 hours x 2 doses. - Repeat labs in the AM.  5.  Paroxysmal atrial fibrillation - Continue home regimen amiodarone  and Toprol -XL for rate control. - Patient with a thrombocytopenia with platelet count of 67K. - Will hold Eliquis  for now and follow. - Discussed with patient's primary cardiologist.  6.  CAD/recent non-STEMI with PCI -Patient noted to have been on triple therapy x 1 week, aspirin  discontinued and patient recommended to continue on Eliquis  and Plavix . - Due to thrombocytopenia will hold Eliquis  and will continue Plavix  at this time and monitor platelet count after discussion with patient's primary cardiologist.  7.  COPD/chronic respiratory failure on 2 L nasal cannula -Stable. - Place on Breztri , PPI. - Xopenex  nebs as needed.  8.  Chronic HFimpEF - Patient noted to have been started back on torsemide  20 mg daily today per his cardiologist note as patient was seen by cardiologist this morning. - Resume patient back on home regimen of torsemide  20 mg daily, Toprol -XL, Entresto .  9.  Bilateral lower extremity wounds/?  Cellulitis -Patient with worsening wounds on lower extremities with  some erythema, some warmth and some tenderness to palpation. - Concern for possible cellulitis in the setting of chronic venous stasis changes. - Consult wound wound care RN. - Place on empiric IV cefepime .  10.  GERD -PPI.  11.  History of CVA -Felt likely secondary to paroxysmal atrial fibrillation. - Continue statin. - Eliquis  on hold due to thrombocytopenia.   DVT prophylaxis: SCDs Code Status:   DNR.  Verified with patient and daughters at bedside Family Communication:  Updated patient and daughters at bedside. Disposition Plan:   Patient is from:  Home  Anticipated DC to:  Home  Anticipated DC date:  2 to 4 days  Anticipated DC barriers: Clinical improvement  Consults called:  None Admission status:  Admit to inpatient/telemetry  Severity of Illness: The appropriate patient status for this patient is INPATIENT. Inpatient status is judged to be reasonable and necessary in order to provide the required intensity of service to ensure the patient's safety. The patient's presenting symptoms, physical exam findings, and initial radiographic and laboratory data in the context of their chronic comorbidities is felt to place them at high risk for further clinical deterioration. Furthermore, it is not anticipated that the patient will be medically stable for discharge from the hospital within 2 midnights of admission.   * I certify that at the point of admission it is my clinical judgment that the patient will require inpatient hospital care spanning beyond 2 midnights from the point of admission due  to high intensity of service, high risk for further deterioration and high frequency of surveillance required.*    Toribio Hummer MD [facility]  How to contact the [facility] Attending or Consulting provider 7A - 7P or covering provider during after hours 7P -7A, for this patient?   Check the care team in [facility] and look for a) attending/consulting [facility] provider listed and b) the [facility] team  listed Log into [email or web address] and use 's universal password to access. If you do not have the password, please contact the hospital operator. Locate the [facility] provider you are looking for under [facility] and page to a number that you can be directly reached. If you still have difficulty reaching the provider, please page the [facility] (Director on Call) for the Hospitalists listed on amion for assistance.  06/30/2024, 7:13 PM

## 2024-07-01 ENCOUNTER — Inpatient Hospital Stay (HOSPITAL_COMMUNITY)

## 2024-07-01 DIAGNOSIS — E1169 Type 2 diabetes mellitus with other specified complication: Secondary | ICD-10-CM

## 2024-07-01 DIAGNOSIS — R042 Hemoptysis: Secondary | ICD-10-CM | POA: Diagnosis not present

## 2024-07-01 DIAGNOSIS — Z7189 Other specified counseling: Secondary | ICD-10-CM | POA: Diagnosis not present

## 2024-07-01 DIAGNOSIS — R7401 Elevation of levels of liver transaminase levels: Secondary | ICD-10-CM

## 2024-07-01 DIAGNOSIS — R197 Diarrhea, unspecified: Secondary | ICD-10-CM

## 2024-07-01 DIAGNOSIS — Z66 Do not resuscitate: Secondary | ICD-10-CM

## 2024-07-01 DIAGNOSIS — C799 Secondary malignant neoplasm of unspecified site: Secondary | ICD-10-CM

## 2024-07-01 DIAGNOSIS — Z515 Encounter for palliative care: Secondary | ICD-10-CM | POA: Diagnosis not present

## 2024-07-01 DIAGNOSIS — R7881 Bacteremia: Secondary | ICD-10-CM | POA: Diagnosis present

## 2024-07-01 DIAGNOSIS — L8990 Pressure ulcer of unspecified site, unspecified stage: Secondary | ICD-10-CM | POA: Diagnosis present

## 2024-07-01 DIAGNOSIS — I480 Paroxysmal atrial fibrillation: Secondary | ICD-10-CM | POA: Diagnosis not present

## 2024-07-01 DIAGNOSIS — K2100 Gastro-esophageal reflux disease with esophagitis, without bleeding: Secondary | ICD-10-CM | POA: Diagnosis not present

## 2024-07-01 DIAGNOSIS — J9601 Acute respiratory failure with hypoxia: Secondary | ICD-10-CM

## 2024-07-01 DIAGNOSIS — D709 Neutropenia, unspecified: Secondary | ICD-10-CM | POA: Diagnosis not present

## 2024-07-01 DIAGNOSIS — R5081 Fever presenting with conditions classified elsewhere: Secondary | ICD-10-CM | POA: Diagnosis not present

## 2024-07-01 DIAGNOSIS — E785 Hyperlipidemia, unspecified: Secondary | ICD-10-CM

## 2024-07-01 DIAGNOSIS — Z8673 Personal history of transient ischemic attack (TIA), and cerebral infarction without residual deficits: Secondary | ICD-10-CM

## 2024-07-01 DIAGNOSIS — J189 Pneumonia, unspecified organism: Secondary | ICD-10-CM | POA: Insufficient documentation

## 2024-07-01 LAB — BLOOD CULTURE ID PANEL (REFLEXED) - BCID2
A.calcoaceticus-baumannii: NOT DETECTED
A.calcoaceticus-baumannii: NOT DETECTED
Bacteroides fragilis: NOT DETECTED
Bacteroides fragilis: NOT DETECTED
CTX-M ESBL: NOT DETECTED
Candida albicans: NOT DETECTED
Candida albicans: NOT DETECTED
Candida auris: NOT DETECTED
Candida auris: NOT DETECTED
Candida glabrata: NOT DETECTED
Candida glabrata: NOT DETECTED
Candida krusei: NOT DETECTED
Candida krusei: NOT DETECTED
Candida parapsilosis: NOT DETECTED
Candida parapsilosis: NOT DETECTED
Candida tropicalis: NOT DETECTED
Candida tropicalis: NOT DETECTED
Carbapenem resist OXA 48 LIKE: NOT DETECTED
Carbapenem resistance IMP: NOT DETECTED
Carbapenem resistance KPC: NOT DETECTED
Carbapenem resistance NDM: NOT DETECTED
Carbapenem resistance VIM: NOT DETECTED
Cryptococcus neoformans/gattii: NOT DETECTED
Cryptococcus neoformans/gattii: NOT DETECTED
Enterobacter cloacae complex: NOT DETECTED
Enterobacter cloacae complex: NOT DETECTED
Enterobacterales: DETECTED — AB
Enterobacterales: NOT DETECTED
Enterococcus Faecium: NOT DETECTED
Enterococcus Faecium: NOT DETECTED
Enterococcus faecalis: DETECTED — AB
Enterococcus faecalis: DETECTED — AB
Escherichia coli: DETECTED — AB
Escherichia coli: NOT DETECTED
Haemophilus influenzae: NOT DETECTED
Haemophilus influenzae: NOT DETECTED
Klebsiella aerogenes: DETECTED — AB
Klebsiella aerogenes: NOT DETECTED
Klebsiella oxytoca: NOT DETECTED
Klebsiella oxytoca: NOT DETECTED
Klebsiella pneumoniae: NOT DETECTED
Klebsiella pneumoniae: NOT DETECTED
Listeria monocytogenes: NOT DETECTED
Listeria monocytogenes: NOT DETECTED
Neisseria meningitidis: NOT DETECTED
Neisseria meningitidis: NOT DETECTED
Proteus species: NOT DETECTED
Proteus species: NOT DETECTED
Pseudomonas aeruginosa: NOT DETECTED
Pseudomonas aeruginosa: NOT DETECTED
Salmonella species: NOT DETECTED
Salmonella species: NOT DETECTED
Serratia marcescens: NOT DETECTED
Serratia marcescens: NOT DETECTED
Staphylococcus aureus (BCID): NOT DETECTED
Staphylococcus aureus (BCID): NOT DETECTED
Staphylococcus epidermidis: NOT DETECTED
Staphylococcus epidermidis: NOT DETECTED
Staphylococcus lugdunensis: NOT DETECTED
Staphylococcus lugdunensis: NOT DETECTED
Staphylococcus species: NOT DETECTED
Staphylococcus species: NOT DETECTED
Stenotrophomonas maltophilia: NOT DETECTED
Stenotrophomonas maltophilia: NOT DETECTED
Streptococcus agalactiae: NOT DETECTED
Streptococcus agalactiae: NOT DETECTED
Streptococcus pneumoniae: NOT DETECTED
Streptococcus pneumoniae: NOT DETECTED
Streptococcus pyogenes: NOT DETECTED
Streptococcus pyogenes: NOT DETECTED
Streptococcus species: NOT DETECTED
Streptococcus species: NOT DETECTED
Vancomycin resistance: NOT DETECTED
Vancomycin resistance: NOT DETECTED

## 2024-07-01 LAB — CBC WITH DIFFERENTIAL/PLATELET
Abs Immature Granulocytes: 0 K/uL (ref 0.00–0.07)
Band Neutrophils: 0 %
Basophils Absolute: 0 K/uL (ref 0.0–0.1)
Basophils Relative: 6 %
Blasts: 0 %
Eosinophils Absolute: 0 K/uL (ref 0.0–0.5)
Eosinophils Relative: 6 %
HCT: 22.3 % — ABNORMAL LOW (ref 39.0–52.0)
Hemoglobin: 7.1 g/dL — ABNORMAL LOW (ref 13.0–17.0)
Immature Granulocytes: 0 %
Lymphocytes Relative: 55 %
Lymphs Abs: 0.1 K/uL — ABNORMAL LOW (ref 0.7–4.0)
MCH: 29.7 pg (ref 26.0–34.0)
MCHC: 31.8 g/dL (ref 30.0–36.0)
MCV: 93.3 fL (ref 80.0–100.0)
Metamyelocytes Relative: 0 %
Monocytes Absolute: 0.1 K/uL (ref 0.1–1.0)
Monocytes Relative: 33 %
Myelocytes: 0 %
Neutro Abs: 0 K/uL — CL (ref 1.7–7.7)
Neutrophils Relative %: 0 %
Other: 0 %
Platelets: 57 K/uL — ABNORMAL LOW (ref 150–400)
Promyelocytes Relative: 0 %
RBC: 2.39 MIL/uL — ABNORMAL LOW (ref 4.22–5.81)
RDW: 15.1 % (ref 11.5–15.5)
WBC: 0.2 K/uL — CL (ref 4.0–10.5)
nRBC: 0 % (ref 0.0–0.2)
nRBC: 0 /100{WBCs}

## 2024-07-01 LAB — EXPECTORATED SPUTUM ASSESSMENT W GRAM STAIN, RFLX TO RESP C

## 2024-07-01 LAB — PREPARE RBC (CROSSMATCH)

## 2024-07-01 LAB — VITAMIN B12: Vitamin B-12: 343 pg/mL (ref 180–914)

## 2024-07-01 LAB — FOLATE: Folate: 11.3 ng/mL (ref >5.9)

## 2024-07-01 LAB — STREP PNEUMONIAE URINARY ANTIGEN: Strep Pneumo Urinary Antigen: NEGATIVE

## 2024-07-01 LAB — COMPREHENSIVE METABOLIC PANEL WITH GFR
ALT: 49 U/L — ABNORMAL HIGH (ref 0–44)
AST: 41 U/L (ref 15–41)
Albumin: 2.1 g/dL — ABNORMAL LOW (ref 3.5–5.0)
Alkaline Phosphatase: 83 U/L (ref 38–126)
Anion gap: 10 (ref 5–15)
BUN: 19 mg/dL (ref 8–23)
CO2: 28 mmol/L (ref 22–32)
Calcium: 8.1 mg/dL — ABNORMAL LOW (ref 8.9–10.3)
Chloride: 99 mmol/L (ref 98–111)
Creatinine, Ser: 1.21 mg/dL (ref 0.61–1.24)
GFR, Estimated: 59 mL/min — ABNORMAL LOW (ref >60)
Glucose, Bld: 113 mg/dL — ABNORMAL HIGH (ref 70–99)
Potassium: 2.8 mmol/L — ABNORMAL LOW (ref 3.5–5.1)
Sodium: 137 mmol/L (ref 135–145)
Total Bilirubin: 1.1 mg/dL (ref 0.0–1.2)
Total Protein: 5.3 g/dL — ABNORMAL LOW (ref 6.5–8.1)

## 2024-07-01 LAB — RESPIRATORY PANEL BY PCR

## 2024-07-01 LAB — HIV ANTIBODY (ROUTINE TESTING W REFLEX): HIV Screen 4th Generation wRfx: NONREACTIVE

## 2024-07-01 LAB — OCCULT BLOOD X 1 CARD TO LAB, STOOL: Fecal Occult Bld: POSITIVE — AB

## 2024-07-01 LAB — IRON AND TIBC
Iron: 16 ug/dL — ABNORMAL LOW (ref 45–182)
Saturation Ratios: 12 % — ABNORMAL LOW (ref 17.9–39.5)
TIBC: 139 ug/dL — ABNORMAL LOW (ref 250–450)
UIBC: 123 ug/dL

## 2024-07-01 LAB — GLUCOSE, CAPILLARY
Glucose-Capillary: 99 mg/dL (ref 70–99)
Glucose-Capillary: 110 mg/dL — ABNORMAL HIGH (ref 70–99)
Glucose-Capillary: 81 mg/dL (ref 70–99)

## 2024-07-01 LAB — MAGNESIUM: Magnesium: 2.3 mg/dL (ref 1.7–2.4)

## 2024-07-01 LAB — FERRITIN: Ferritin: 464 ng/mL — ABNORMAL HIGH (ref 24–336)

## 2024-07-01 LAB — C DIFFICILE QUICK SCREEN W PCR REFLEX
C Diff antigen: NEGATIVE
C Diff interpretation: NOT DETECTED
C Diff toxin: NEGATIVE

## 2024-07-01 LAB — PHOSPHORUS: Phosphorus: 2.6 mg/dL (ref 2.5–4.6)

## 2024-07-01 LAB — MRSA NEXT GEN BY PCR, NASAL: MRSA by PCR Next Gen: NOT DETECTED

## 2024-07-01 MED ORDER — INSULIN ASPART 100 UNIT/ML IJ SOLN: 0.0000 [IU] | Freq: Three times a day (TID) | INTRAMUSCULAR | Status: DC

## 2024-07-01 MED ORDER — SODIUM CHLORIDE 0.9% IV SOLUTION: Freq: Once | INTRAVENOUS | Status: AC

## 2024-07-01 MED ORDER — FUROSEMIDE 10 MG/ML IJ SOLN
20.0000 mg | Freq: Once | INTRAMUSCULAR | Status: AC
  Administered 2024-07-01: 20 mg via INTRAVENOUS
  Filled 2024-07-01: qty 2

## 2024-07-01 MED ORDER — BUDESONIDE 0.5 MG/2ML IN SUSP
0.5000 mg | Freq: Two times a day (BID) | RESPIRATORY_TRACT | Status: DC
  Administered 2024-07-01 – 2024-07-03 (×6): 0.5 mg via RESPIRATORY_TRACT
  Filled 2024-07-01 (×6): qty 2

## 2024-07-01 MED ORDER — POTASSIUM CHLORIDE CRYS ER 20 MEQ PO TBCR: 40.0000 meq | EXTENDED_RELEASE_TABLET | ORAL | Status: DC

## 2024-07-01 MED ORDER — SODIUM CHLORIDE 0.9 % IV SOLN
2.0000 g | INTRAVENOUS | Status: DC
  Administered 2024-07-01 – 2024-07-03 (×10): 2 g via INTRAVENOUS
  Filled 2024-07-01 (×15): qty 2000

## 2024-07-01 MED ORDER — IPRATROPIUM BROMIDE 0.02 % IN SOLN
0.5000 mg | Freq: Three times a day (TID) | RESPIRATORY_TRACT | Status: DC
  Administered 2024-07-02: 0.5 mg via RESPIRATORY_TRACT
  Filled 2024-07-01: qty 2.5

## 2024-07-01 MED ORDER — ACETAMINOPHEN 325 MG PO TABS
650.0000 mg | ORAL_TABLET | Freq: Once | ORAL | Status: AC
  Administered 2024-07-01: 650 mg via ORAL
  Filled 2024-07-01: qty 2

## 2024-07-01 MED ORDER — POTASSIUM CHLORIDE CRYS ER 20 MEQ PO TBCR
40.0000 meq | EXTENDED_RELEASE_TABLET | ORAL | Status: AC
  Administered 2024-07-01 (×2): 40 meq via ORAL
  Filled 2024-07-01 (×2): qty 2

## 2024-07-01 MED ORDER — PANTOPRAZOLE SODIUM 40 MG IV SOLR
40.0000 mg | Freq: Two times a day (BID) | INTRAVENOUS | Status: DC
  Administered 2024-07-01 – 2024-07-03 (×5): 40 mg via INTRAVENOUS
  Filled 2024-07-01 (×5): qty 10

## 2024-07-01 MED ORDER — SODIUM CHLORIDE 0.9% IV SOLUTION: Freq: Once | INTRAVENOUS | Status: DC

## 2024-07-01 MED ORDER — LEVALBUTEROL HCL 0.63 MG/3ML IN NEBU
0.6300 mg | INHALATION_SOLUTION | Freq: Three times a day (TID) | RESPIRATORY_TRACT | Status: DC
  Administered 2024-07-02: 0.63 mg via RESPIRATORY_TRACT
  Filled 2024-07-01: qty 3

## 2024-07-01 MED ORDER — LEVALBUTEROL HCL 0.63 MG/3ML IN NEBU
0.6300 mg | INHALATION_SOLUTION | Freq: Three times a day (TID) | RESPIRATORY_TRACT | Status: DC
  Administered 2024-07-01 (×2): 0.63 mg via RESPIRATORY_TRACT
  Filled 2024-07-01 (×2): qty 3

## 2024-07-01 MED ORDER — PHENOL 1.4 % MT LIQD
1.0000 | OROMUCOSAL | Status: DC | PRN
  Administered 2024-07-01: 1 via OROMUCOSAL
  Filled 2024-07-01: qty 177

## 2024-07-01 MED ORDER — IPRATROPIUM BROMIDE 0.02 % IN SOLN
0.5000 mg | Freq: Three times a day (TID) | RESPIRATORY_TRACT | Status: DC
  Administered 2024-07-01 (×2): 0.5 mg via RESPIRATORY_TRACT
  Filled 2024-07-01 (×2): qty 2.5

## 2024-07-01 NOTE — Progress Notes (Signed)
 PROGRESS NOTE    Joshua Compton.  FMW:[id number] DOB: Oct 03, 1941 DOA: 06/30/2024 PCP: [name], MD    No chief complaint on file.   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  Patient admitted, pancultured.  Patient noted with a bacteremia and concern for pneumonia.  Patient on empiric IV antibiotics.  ID consulted.  Oncology consulted.  Palliative consulted   Assessment & Plan:   Principal Problem:   Neutropenic fever (HCC) Active Problems:   Bacteremia   NSTEMI (non-ST elevated myocardial infarction) (HCC)   PAF (paroxysmal atrial fibrillation) (HCC)   Chronic kidney disease, stage 3a (HCC)   Chronic diastolic CHF (congestive heart failure) (HCC)   Centrilobular emphysema (HCC)   Adenocarcinoma of unknown primary (HCC)   History of CVA (cerebrovascular accident)   Gastroesophageal reflux disease with esophagitis without hemorrhage   Type 2 diabetes mellitus with hyperlipidemia (HCC)   Cerebrovascular accident (CVA) due to embolism of left middle cerebral artery (HCC)   Venous insufficiency of both lower extremities   Cancer, metastatic to bone (HCC)   Pancytopenia (HCC)   Pressure injury of skin   CAP (community acquired pneumonia)   Hemoptysis   Diarrhea  #1 neutropenic fever/?CAP/bacteremia -Patient presenting with drowsiness, fevers x 3 days, cough, shortness of breath and noted with a severe neutropenia with white count of 0.2 and ANC of 0.0. - Chest x-ray done  with bibasilar patchy opacities which may represent atelectasis or pneumonia. - Patient with some lower extremity wounds with some redness and concern for possible cellulitis. - Urinalysis nitrite negative leukocyte negative. - Blood cultures obtained with 2/2 bottles with BC ID growing Enterococcus faecalis, E. coli, Klebsiella aerogenes - Chest x-ray done this morning, 07/01/2024, with progressive right greater than left interstitial and airspace opacities suspicious for pneumonia superimposed upon sequela of smoking/chronic bronchitis.  -Procalcitonin noted at 1.11.   - Urine cultures pending.  - Urine strep pneumococcus antigen negative.  - Urine Legionella antigen pending.  - Stool studies pending.  - Respiratory viral panel negative. - SARS coronavirus 2 by PCR negative, influenza A and B by PCR negative, RSV by PCR negative. -Continue G-CSF. -Discontinue vancomycin . -Continue IV cefepime . -Patient started on IV ampicillin . -ID consulted. -Supportive care.  2.  Bacteremia secondary to E. Coli, Enterococcus faecalis, Klebsiella aerogenes.  -??  Etiology. -Urine cultures pending. -Patient was on IV vancomycin  and IV cefepime  on admission. -Discontinue IV vancomycin . -Continue IV cefepime . -Start IV ampicillin . -ID consulted.  3.  Hemoptysis -??  Etiology -Chest x-ray concerning for community-acquired pneumonia in the immunocompromised patient presented with neutropenic fevers in the setting of thrombocytopenia. -Patient noted to be on dual antiplatelet therapy of Eliquis  and Plavix  prior to admission secondary to recent non-STEMI status post DES. -Spoke with patient's primary cardiologist on admission and recommended holding Eliquis  with continuation of Plavix  due to recent stent placement. -Will transfuse a unit of platelets and 2 units PRBC due to drop in hemoglobin to 7.1. -Continue empiric IV antibiotics. -May need to CT chest if worsening or continued hemoptysis.   4.  Pancytopenia -Secondary to recent chemotherapy. - Patient being pancultured secondary to problem #1. - Patient noted to have received G-CSF per family on Monday, 06/27/2024. - Patient noted to have started chemotherapy on 06/24/2024 and per daughters has completed 9 radiation treatments. - Patient with a bacteremia with blood cultures growing Enterococcus faecalis, E. coli, Klebsiella erogenous.  - Continue G-CSF -Hemoglobin noted at 7.1. -Anemia panel with iron level of 16, TIBC of 139, ferritin of 464, folate of 11.3, vitamin B12 of 343. -Transfuse 2 units PRBCs. -Platelet count noted at 57K, patient with hemoptysis and as such we will transfuse a unit of platelets. - Follow H&H and transfuse for hemoglobin < 8. - Patient's primary oncologist is aware of patient's admission and will be following patient during the hospitalization. - Supportive care.   5.  Metastatic adenocarcinoma involving the bones and muscle, primary unknown -Patient noted to have recently started chemotherapy on 06/24/2024. - Per daughters at bedside patient has already completed 9 radiation treatments. -Per daughter his next chemotherapy treatment is on 07/15/2024. -Patient now with hemoptysis, bacteremia with 3 separate organisms, community-acquired pneumonia, neutropenic fevers, pancytopenia with worsening platelets count. -Patient with poor prognosis. -Oncology informed of admission and will be following patient. -Consulted palliative care for goals of care.   6.  Hypokalemia -Magnesium  noted at 2.3. - Potassium at 2.8. - K-Dur 40 mEq p.o. every 4 hours x 2 doses. - Repeat labs in the AM.   7.  Paroxysmal atrial fibrillation - Continue home regimen amiodarone  and Toprol -XL for rate control. - Patient with a thrombocytopenia with platelet count of 57K. -Patient with hemoptysis this morning. -Continue to hold Eliquis . - Discussed with patient's primary cardiologist.   8.  CAD/recent non-STEMI with PCI -Patient  noted to have been on triple therapy x 1 week, aspirin  discontinued and patient recommended to continue on Eliquis  and Plavix . - Due to thrombocytopenia Eliquis  held and will continue to hold as patient now with hemoptysis.   - Continue Plavix  and monitor platelet count.   - Discussed with patient's primary cardiologist.    9.  COPD/chronic respiratory failure on 2 L nasal cannula -Stable. - Discontinue Breztri  and placed on Pulmicort  twice daily.   - Placed on scheduled Xopenex  and Atrovent  nebs.    10.  Chronic HFimpEF - Patient noted to have been started back on torsemide  20 mg daily today per his cardiologist note as patient was seen by cardiologist this morning. - Continue home regimen torsemide  20 mg daily, Toprol -XL, Entresto .    11.  Bilateral lower extremity wounds/?  Cellulitis -Patient with worsening wounds on lower extremities with some erythema, some warmth and some tenderness to palpation. - Concern for possible cellulitis in the setting of chronic venous stasis changes. - Wound care RN consulted.   - On empiric IV antibiotics.     12.  GERD - Continue PPI.   13.  History of CVA -Felt likely secondary to paroxysmal atrial fibrillation. - Continue statin. - Eliquis  on hold due to thrombocytopenia and outpatient with hemoptysis.  14.  Diarrhea -Patient with multiple watery loose stools. - Discontinue laxatives. - Check a C. difficile PCR, GI pathogen panel.  15.  Pressure injury left buttock stage II/right buttock stage II, POA/lower extremity vascular ulcers.POA - WOC RN consulted.    DVT prophylaxis: SCDs Code Status: DNR Family Communication: Updated patient, 3 daughters at bedside and another daughter on speaker phone. Disposition: Transfer to stepdown unit  Status is: Inpatient Remains inpatient appropriate because: Severity of  illness   Consultants:  ID pending Oncology pending  Procedures:  Chest x-ray 06/30/2024, 07/01/2024 Transfused 2 units PRBC  pending Transfuse 1 unit platelet pending  Antimicrobials:  Anti-infectives (From admission, onward)    Start     Dose/Rate Route Frequency Ordered Stop   07/01/24 2000  vancomycin  (VANCOREADY) IVPB 1500 mg/300 mL  Status:  Discontinued        1,500 mg 150 mL/hr over 120 Minutes Intravenous Every 24 hours 06/30/24 1920 07/01/24 1035   07/01/24 1600  ampicillin  (OMNIPEN) 2 g in sodium chloride  0.9 % 100 mL IVPB        2 g 300 mL/hr over 20 Minutes Intravenous Every 4 hours 07/01/24 1022     06/30/24 2000  vancomycin  (VANCOREADY) IVPB 1750 mg/350 mL        1,750 mg 175 mL/hr over 120 Minutes Intravenous  Once 06/30/24 1920 06/30/24 2344   06/30/24 1930  ceFEPIme  (MAXIPIME ) 2 g in sodium chloride  0.9 % 100 mL IVPB        2 g 200 mL/hr over 30 Minutes Intravenous Every 12 hours 06/30/24 1919           Subjective: Patient sitting in recliner.  Patient noted with some hemoptysis this morning.  Patient denies any chest pain or significant shortness of breath.  No abdominal pain.  Less drowsy today.  Daughters are at bedside.  Objective: Vitals:   07/01/24 0933 07/01/24 1002 07/01/24 1200 07/01/24 1232  BP:  (!) 133/49 (!) 125/49 (!) 119/48  Pulse:  88 76 75  Resp:   20 18  Temp:   98.7 F (37.1 C) 98 F (36.7 C)  TempSrc:   Oral   SpO2: 94%  96%   Weight:      Height:        Intake/Output Summary (Last 24 hours) at 07/01/2024 1509 Last data filed at 07/01/2024 1000 Gross per 24 hour  Intake 966.87 ml  Output 577 ml  Net 389.87 ml   Filed Weights   06/30/24 1807  Weight: 85.6 kg    Examination:  General exam: Appears calm and comfortable  Respiratory system: Some coarse breath sounds noted on the right.  No wheezing.  Fair air movement.  Cardiovascular system: S1 & S2 heard, RRR. No JVD, murmurs, rubs, gallops or clicks.  Trace bilateral lower extremity edema. Gastrointestinal system: Abdomen is nondistended, soft and nontender. No organomegaly or masses felt. Normal  bowel sounds heard. Central nervous system: Alert and oriented.  Moving extremities spontaneously.  No focal neurological deficits. Extremities: Symmetric 5 x 5 power. Skin: No rashes, lesions or ulcers Psychiatry: Judgement and insight appear fair. Mood & affect appropriate.     Data Reviewed: I have personally reviewed following labs and imaging studies  CBC: Recent Labs  Lab 06/30/24 1424 07/01/24 0441  WBC 0.2* 0.2*  NEUTROABS 0.0* 0.0*  HGB 8.4* 7.1*  HCT 24.8* 22.3*  MCV 89.5 93.3  PLT 67* 57*    Basic Metabolic Panel: Recent Labs  Lab 06/30/24 1424 07/01/24 0441  NA 140 137  K 2.9* 2.8*  CL 100 99  CO2 33* 28  GLUCOSE 109* 113*  BUN 16 19  CREATININE 1.04 1.21  CALCIUM  8.5* 8.1*  MG 1.8 2.3  PHOS  --  2.6    GFR: Estimated Creatinine Clearance: 50.8 mL/min (by C-G formula based on SCr of 1.21 mg/dL).  Liver Function Tests: Recent Labs  Lab 06/30/24 1424 07/01/24 0441  AST 45* 41  ALT  57* 49*  ALKPHOS 105 83  BILITOT 1.1 1.1  PROT 5.7* 5.3*  ALBUMIN  3.0* 2.1*    CBG: Recent Labs  Lab 06/30/24 1719 07/01/24 0830 07/01/24 1201  GLUCAP 106* 99 110*     Recent Results (from the past 240 hours)  Culture, blood (Routine X 2) w Reflex to ID Panel     Status: None (Preliminary result)   Collection Time: 06/30/24  3:18 PM   Specimen: BLOOD  Result Value Ref Range Status   Specimen Description BLOOD LEFT ANTECUBITAL  Final   Special Requests   Final    BOTTLES DRAWN AEROBIC AND ANAEROBIC Blood Culture adequate volume   Culture  Setup Time   Final    GRAM NEGATIVE RODS GRAM POSITIVE COCCI IN PAIRS IN BOTH AEROBIC AND ANAEROBIC BOTTLES CRITICAL RESULT CALLED TO, READ BACK BY AND VERIFIED WITH: PHARMD C.DAVIS AT 942 ON 07/01/2024 BY T.SAAD. Performed at [facility], [address] [place], [place] [address]    Culture VONNE NEGATIVE RODS GRAM POSITIVE COCCI   Final   Report Status PENDING  Incomplete  Blood Culture ID Panel  (Reflexed)     Status: Abnormal   Collection Time: 06/30/24  3:18 PM  Result Value Ref Range Status   Enterococcus faecalis DETECTED (A) NOT DETECTED Final    Comment: CRITICAL RESULT CALLED TO, READ BACK BY AND VERIFIED WITH: PHARMD C.DAVIS AT 942 ON 07/01/2024 BY T.SAAD.    Enterococcus Faecium NOT DETECTED NOT DETECTED Final   Listeria monocytogenes NOT DETECTED NOT DETECTED Final   Staphylococcus species NOT DETECTED NOT DETECTED Final   Staphylococcus aureus (BCID) NOT DETECTED NOT DETECTED Final   Staphylococcus epidermidis NOT DETECTED NOT DETECTED Final   Staphylococcus lugdunensis NOT DETECTED NOT DETECTED Final   Streptococcus species NOT DETECTED NOT DETECTED Final   Streptococcus agalactiae NOT DETECTED NOT DETECTED Final   Streptococcus pneumoniae NOT DETECTED NOT DETECTED Final   Streptococcus pyogenes NOT DETECTED NOT DETECTED Final   A.calcoaceticus-baumannii NOT DETECTED NOT DETECTED Final   Bacteroides fragilis NOT DETECTED NOT DETECTED Final   Enterobacterales DETECTED (A) NOT DETECTED Final    Comment: CRITICAL RESULT CALLED TO, READ BACK BY AND VERIFIED WITH: PHARMD C.DAVIS AT 942 ON 07/01/2024 BY T.SAAD.    Enterobacter cloacae complex NOT DETECTED NOT DETECTED Final   Escherichia coli DETECTED (A) NOT DETECTED Final    Comment: CRITICAL RESULT CALLED TO, READ BACK BY AND VERIFIED WITH: PHARMD C.DAVIS AT 942 ON 07/01/2024 BY T.SAAD.    Klebsiella aerogenes DETECTED (A) NOT DETECTED Final    Comment: CRITICAL RESULT CALLED TO, READ BACK BY AND VERIFIED WITH: PHARMD C.DAVIS AT 942 ON 07/01/2024 BY T.SAAD.    Klebsiella oxytoca NOT DETECTED NOT DETECTED Final   Klebsiella pneumoniae NOT DETECTED NOT DETECTED Final   Proteus species NOT DETECTED NOT DETECTED Final   Salmonella species NOT DETECTED NOT DETECTED Final   Serratia marcescens NOT DETECTED NOT DETECTED Final   Haemophilus influenzae NOT DETECTED NOT DETECTED Final   Neisseria meningitidis NOT  DETECTED NOT DETECTED Final   Pseudomonas aeruginosa NOT DETECTED NOT DETECTED Final   Stenotrophomonas maltophilia NOT DETECTED NOT DETECTED Final   Candida albicans NOT DETECTED NOT DETECTED Final   Candida auris NOT DETECTED NOT DETECTED Final   Candida glabrata NOT DETECTED NOT DETECTED Final   Candida krusei NOT DETECTED NOT DETECTED Final   Candida parapsilosis NOT DETECTED NOT DETECTED Final   Candida tropicalis NOT DETECTED NOT DETECTED Final  Cryptococcus neoformans/gattii NOT DETECTED NOT DETECTED Final   CTX-M ESBL NOT DETECTED NOT DETECTED Final   Carbapenem resistance IMP NOT DETECTED NOT DETECTED Final   Carbapenem resistance KPC NOT DETECTED NOT DETECTED Final   Carbapenem resistance NDM NOT DETECTED NOT DETECTED Final   Carbapenem resist OXA 48 LIKE NOT DETECTED NOT DETECTED Final   Vancomycin  resistance NOT DETECTED NOT DETECTED Final   Carbapenem resistance VIM NOT DETECTED NOT DETECTED Final    Comment: Performed at [facility], [address], [place], [place] [address]  Resp panel by RT-PCR (RSV, Flu A&B, Covid) Anterior Nasal Swab     Status: None   Collection Time: 06/30/24  7:58 PM   Specimen: Anterior Nasal Swab  Result Value Ref Range Status   SARS Coronavirus 2 by RT PCR NEGATIVE NEGATIVE Final    Comment: (NOTE) SARS-CoV-2 target nucleic acids are NOT DETECTED.  The SARS-CoV-2 RNA is generally detectable in upper respiratory specimens during the acute phase of infection. The lowest concentration of SARS-CoV-2 viral copies this assay can detect is 138 copies/mL. A negative result does not preclude SARS-Cov-2 infection and should not be used as the sole basis for treatment or other patient management decisions. A negative result may occur with  improper specimen collection/handling, submission of specimen other than nasopharyngeal swab, presence of viral mutation(s) within the areas targeted by this assay, and inadequate number of viral copies(<138  copies/mL). A negative result must be combined with clinical observations, patient history, and epidemiological information. The expected result is Negative.  Fact Sheet for Patients:  BloggerCourse.com  Fact Sheet for Healthcare Providers:  SeriousBroker.it  This test is no t yet approved or cleared by the [place]  FDA and  has been authorized for detection and/or diagnosis of SARS-CoV-2 by FDA under an Emergency Use Authorization (EUA). This EUA will remain  in effect (meaning this test can be used) for the duration of the COVID-19 declaration under Section 564(b)(1) of the Act, 21 U.S.C.section 360bbb-3(b)(1), unless the authorization is terminated  or revoked sooner.       Influenza A by PCR NEGATIVE NEGATIVE Final   Influenza B by PCR NEGATIVE NEGATIVE Final    Comment: (NOTE) The Xpert Xpress SARS-CoV-2/FLU/RSV plus assay is intended as an aid in the diagnosis of influenza from Nasopharyngeal swab specimens and should not be used as a sole basis for treatment. Nasal washings and aspirates are unacceptable for Xpert Xpress SARS-CoV-2/FLU/RSV testing.  Fact Sheet for Patients: BloggerCourse.com  Fact Sheet for Healthcare Providers: SeriousBroker.it  This test is not yet approved or cleared by the [place]  FDA and has been authorized for detection and/or diagnosis of SARS-CoV-2 by FDA under an Emergency Use Authorization (EUA). This EUA will remain in effect (meaning this test can be used) for the duration of the COVID-19 declaration under Section 564(b)(1) of the Act, 21 U.S.C. section 360bbb-3(b)(1), unless the authorization is terminated or revoked.     Resp Syncytial Virus by PCR NEGATIVE NEGATIVE Final    Comment: (NOTE) Fact Sheet for Patients: BloggerCourse.com  Fact Sheet for Healthcare  Providers: SeriousBroker.it  This test is not yet approved or cleared by the [place]  FDA and has been authorized for detection and/or diagnosis of SARS-CoV-2 by FDA under an Emergency Use Authorization (EUA). This EUA will remain in effect (meaning this test can be used) for the duration of the COVID-19 declaration under Section 564(b)(1) of the Act, 21 U.S.C. section 360bbb-3(b)(1), unless the authorization is terminated or revoked.  Performed at [facility], [address] [place], [place] [address]   Respiratory (~20 pathogens) panel by PCR     Status: None   Collection Time: 06/30/24  7:58 PM   Specimen: Nasopharyngeal Swab; Respiratory  Result Value Ref Range Status   Adenovirus NOT DETECTED NOT DETECTED Final   Coronavirus 229E NOT DETECTED NOT DETECTED Final    Comment: (NOTE) The Coronavirus on the Respiratory Panel, DOES NOT test for the novel  Coronavirus (2019 nCoV)    Coronavirus HKU1 NOT DETECTED NOT DETECTED Final   Coronavirus NL63 NOT DETECTED NOT DETECTED Final   Coronavirus OC43 NOT DETECTED NOT DETECTED Final   Metapneumovirus NOT DETECTED NOT DETECTED Final   Rhinovirus / Enterovirus NOT DETECTED NOT DETECTED Final   Influenza A NOT DETECTED NOT DETECTED Final   Influenza B NOT DETECTED NOT DETECTED Final   Parainfluenza Virus 1 NOT DETECTED NOT DETECTED Final   Parainfluenza Virus 2 NOT DETECTED NOT DETECTED Final   Parainfluenza Virus 3 NOT DETECTED NOT DETECTED Final   Parainfluenza Virus 4 NOT DETECTED NOT DETECTED Final   Respiratory Syncytial Virus NOT DETECTED NOT DETECTED Final   Bordetella pertussis NOT DETECTED NOT DETECTED Final   Bordetella Parapertussis NOT DETECTED NOT DETECTED Final   Chlamydophila pneumoniae NOT DETECTED NOT DETECTED Final   Mycoplasma pneumoniae NOT DETECTED NOT DETECTED Final    Comment: Performed at [facility], [address] [place], [place] [address]   Culture, blood (routine x 2) Call MD if unable to obtain prior to antibiotics being given     Status: None (Preliminary result)   Collection Time: 06/30/24  9:01 PM   Specimen: BLOOD  Result Value Ref Range Status   Specimen Description   Final    BLOOD BLOOD LEFT ARM Performed at [facility], [address], [place], [place] [address]    Special Requests   Final    BOTTLES DRAWN AEROBIC ONLY Blood Culture results may not be optimal due to an inadequate volume of blood received in culture bottles Performed at [facility], [address] [place], [place] [address]    Culture  Setup Time   Final    GRAM POSITIVE COCCI GRAM NEGATIVE RODS AEROBIC BOTTLE ONLY CRITICAL VALUE NOTED.  VALUE IS CONSISTENT WITH PREVIOUSLY REPORTED AND CALLED VALUE. Performed at [facility], [address] [place], [place] [address]    Culture GRAM POSITIVE COCCI GRAM NEGATIVE RODS   Final   Report Status PENDING  Incomplete  Culture, blood (routine x 2) Call MD if unable to obtain prior to antibiotics being given     Status: None (Preliminary result)   Collection Time: 06/30/24  9:01 PM   Specimen: BLOOD  Result Value Ref Range Status   Specimen Description   Final    BLOOD BLOOD LEFT HAND Performed at [facility], [address], [place], [place] [address]    Special Requests   Final    BOTTLES DRAWN AEROBIC ONLY Blood Culture results may not be optimal due to an inadequate volume of blood received in culture bottles Performed at [facility], [address] [place], [place] [address]    Culture  Setup Time   Final    GRAM POSITIVE COCCI IN CHAINS AEROBIC BOTTLE ONLY CRITICAL RESULT CALLED TO, READ BACK BY AND VERIFIED WITH: PHARMD ABBY BETHENE [id number] AT 1457 BY EC Performed at [facility], [address], [place], [place] [address]    Culture GRAM POSITIVE COCCI IN CHAINS  Final   Report Status  PENDING  Incomplete  Expectorated Sputum Assessment w Gram Stain, Rflx to Resp Cult     Status: None   Collection Time: 06/30/24 10:51 PM   Specimen: Expectorated Sputum  Result Value Ref Range Status   Specimen Description EXPECTORATED SPUTUM  Final   Special Requests NONE  Final   Sputum evaluation   Final    Sputum specimen not acceptable for testing.  Please recollect.   NOTIFIED JEREMY, RN @2326  ON 06/30/24 NT. Performed at [facility], [address] [place], [place] [address]    Report Status 06/30/2024 FINAL  Final  Expectorated Sputum Assessment w Gram Stain, Rflx to Resp Cult     Status: None   Collection Time: 07/01/24  2:45 AM   Specimen: SPU  Result Value Ref Range Status   Specimen Description SPUTUM  Final   Special Requests NONE  Final   Sputum evaluation   Final    Sputum specimen not acceptable for testing.  Please recollect.   NOTIFIED JEREMY RN OF RECOLLECT @ 0413 ON 07/01/2024 BY MTA Performed at [facility], [address] [place], [place] [address]    Report Status 07/01/2024 FINAL  Final  C Difficile Quick Screen w PCR reflex     Status: None   Collection Time: 07/01/24  8:15 AM   Specimen: STOOL  Result Value Ref Range Status   C Diff antigen NEGATIVE NEGATIVE Final   C Diff toxin NEGATIVE NEGATIVE Final   C Diff interpretation No C. difficile detected.  Final    Comment: Performed at [facility], [address], [place], [place] [address]         Radiology Studies: DG CHEST PORT 1 VIEW Result Date: 07/01/2024 CLINICAL DATA:  Hypoxia EXAM: PORTABLE CHEST 1 VIEW COMPARISON:  Yesterday FINDINGS: Right Port-A-Cath tip at low SVC or superior caval/atrial junction. Tracheal deviation left, consistent with known right-sided thyroid  mass. Borderline cardiomegaly. Atherosclerosis in the transverse aorta. No pleural effusion or pneumothorax. Underlying interstitial thickening is likely related to  smoking/chronic bronchitis. More confluent right greater than left interstitial and airspace opacities again identified. Progressive, especially on the right. IMPRESSION: Progressive right greater than left interstitial and airspace opacities, suspicious for pneumonia superimposed upon sequelae of smoking/chronic bronchitis. Aortic Atherosclerosis (ICD10-I70.0). Electronically Signed   By: Rockey Kilts M.D.   On: 07/01/2024 09:11   DG Chest 2 View Result Date: 06/30/2024 EXAM: 2 VIEW(S) XRAY OF THE CHEST 06/30/2024 01:41:00 PM COMPARISON: 06/09/2024 CLINICAL HISTORY: SOB \\T \ fever- pt also states he is having slight mid chest pain, coughing up phlegm - Adenocarcinoma of unknown primary ; Hx of PAF, CAD, recent NSTEMI, COPD FINDINGS: LINES AND TUBES: Right chest wall port tip terminates over the SVC. LUNGS AND PLEURA: Bibasilar patchy opacities. No pleural effusion. No pneumothorax. HEART AND MEDIASTINUM: Leftward deviation of the trachea, in keeping with known right thyroid  mass. Unchanged cardiomediastinal silhouette. BONES AND SOFT TISSUES: No acute osseous abnormality. IMPRESSION: 1. Bibasilar patchy opacities, which may represent atelectasis, aspiration, or pneumonia. 2. Leftward deviation of the trachea, in keeping with known right thyroid  mass. 3. Unchanged cardiomediastinal silhouette. Electronically signed by: Limin Xu MD 06/30/2024 01:50 PM EDT RP Workstation: [id number]        Scheduled Meds:  sodium chloride    Intravenous Once   amiodarone   200 mg Oral Daily   atorvastatin   40 mg Oral Daily   budesonide  (PULMICORT ) nebulizer solution  0.5 mg Nebulization BID   Chlorhexidine  Gluconate Cloth  6 each Topical Daily   cholecalciferol   1,000 Units Oral Daily   clopidogrel   75 mg Oral Q breakfast   dorzolamide -timolol   1 drop Both Eyes Daily   filgrastim  (NIVESTYM ) SQ  480 mcg Subcutaneous q1800   furosemide   20 mg Intravenous Once   guaiFENesin   1,200 mg Oral BID   insulin  aspart  0-9 Units  Subcutaneous TID WC   ipratropium  0.5 mg Nebulization Q8H   levalbuterol   0.63 mg Nebulization Q8H   metoprolol  succinate  12.5 mg Oral Daily   oxyCODONE   10 mg Oral Q12H   pantoprazole  (PROTONIX ) IV  40 mg Intravenous Q12H   rOPINIRole   1 mg Oral TID   sacubitril -valsartan   1 tablet Oral BID   sodium chloride  flush  10-40 mL Intracatheter Q12H   torsemide   20 mg Oral Daily   Continuous Infusions:  sodium chloride      ampicillin  (OMNIPEN) IV     ceFEPime  (MAXIPIME ) IV 2 g (07/01/24 0701)     LOS: 1 day    Time spent: 50 minutes    Toribio Hummer, MD [facility]   To contact the attending provider between 7A-7P or the covering provider during after hours 7P-7A, please log into the web site [email or web address] and access using universal [place] password for that web site. If you do not have the password, please call the hospital operator.  07/01/2024, 3:09 PM

## 2024-07-01 NOTE — Evaluation (Signed)
 Occupational Therapy Evaluation Patient Details Name: Joshua Compton. MRN: [id number] DOB: 1941-10-13 Today's Date: 07/01/2024   History of Present Illness   83 y.o. male presented from the cancer center with concerns for neutropenic fever. 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     Clinical Impressions Pt admitted with above diagnosis. Prior to admission, pt lives with spouse and family members are nearby to assist. Pt uses RW for mobility and is mod independent for ADL performance. Pt lethargic, improves with upright postioning. Requires MIN A (+2 for safety due to lines/leads) for bed mobility and functional transfers from bed > BSC > recliner step pivot using RW. Pt with continent BM and TOTAL A for pericare provided in standing. Pt fatigues quickly, and demonstrates deficits in strength, balance, activity tolerance which impact safe, efficient ADL performance. Pt would benefit from skilled OT services to address noted impairments and functional limitations (see below for any additional details) in order to maximize safety and independence while minimizing falls risk and caregiver burden. Anticipate the need for follow up OT services upon acute hospital DC. Patient will benefit from continued inpatient follow up therapy, <3 hours/day      If plan is discharge home, recommend the following:   A lot of help with walking and/or transfers;A lot of help with bathing/dressing/bathroom;Direct supervision/assist for medications management;Direct supervision/assist for  financial management;Assist for transportation;Help with stairs or ramp for entrance;Supervision due to cognitive status     Functional Status Assessment   Patient has had a recent decline in their functional status and demonstrates the ability to make significant improvements in function in a reasonable and predictable amount of time.     Equipment Recommendations   Tub/shower seat;BSC/3in1;Wheelchair (measurements OT);Wheelchair cushion (measurements OT)      Precautions/Restrictions   Precautions Precautions: Fall Recall of Precautions/Restrictions: Intact Precaution/Restrictions Comments: 2 falls in past 6 months Restrictions Weight Bearing Restrictions Per Provider Order: No     Mobility Bed Mobility Overal bed mobility: Needs Assistance Bed Mobility: Supine to Sit     Supine to sit: Min assist, +2 for safety/equipment     General bed mobility comments: assist to pivot hips, VCs for technique    Transfers Overall transfer level: Needs assistance Equipment used: Rolling walker (2 wheels) Transfers: Sit to/from Stand, Bed to chair/wheelchair/BSC Sit to Stand: Min assist     Step pivot transfers: Min assist     General transfer comment: bed > BSC > recliner      Balance Overall balance assessment: Needs assistance Sitting-balance support: Feet supported, No upper extremity supported Sitting balance-Leahy Scale: Fair     Standing balance support: Bilateral upper extremity supported, During functional activity, Reliant on assistive device for balance Standing balance-Leahy Scale: Poor                             ADL either performed or assessed with clinical judgement   ADL Overall ADL's : Needs assistance/impaired Eating/Feeding: Minimal assistance Eating/Feeding Details (indicate cue type and reason): minA to manage suction throughout session     Upper Body Bathing: Minimal assistance;Sitting  Lower Body Bathing: Sit to/from  stand;Maximal assistance   Upper Body Dressing : Sitting;Minimal assistance   Lower Body Dressing: Sit to/from stand;Maximal assistance   Toilet Transfer: Minimal assistance;Cueing for safety;Cueing for sequencing;BSC/3in1;Rolling walker (2 wheels);+2 for safety/equipment Toilet Transfer Details (indicate cue type and reason): cues for hand placement, +2 for safety due to lines and leads Toileting- Clothing Manipulation and Hygiene: Total assistance;Sit to/from stand Toileting - Clothing Manipulation Details (indicate cue type and reason): after continent BM on BSC     Functional mobility during ADLs: +2 for safety/equipment;Minimal assistance;Cueing for safety;Cueing for sequencing;Rolling walker (2 wheels)       Vision Baseline Vision/History: 1 Wears glasses Ability to See in Adequate Light: 0 Adequate              Pertinent Vitals/Pain Pain Assessment Faces Pain Scale: Hurts a little bit Pain Location: throat Pain Descriptors / Indicators: Sore Pain Intervention(s): Limited activity within patient's tolerance, Monitored during session     Extremity/Trunk Assessment Upper Extremity Assessment Upper Extremity Assessment: Right hand dominant;RUE deficits/detail RUE Deficits / Details: hx of CVA with residual R-sided deficits. Pt noted to have decreased GMC/FMC of dominant RUE, increased tone at shoulder/elbow. Uses hand during functional tasks. RUE Coordination: decreased fine motor;decreased gross motor   Lower Extremity Assessment Lower Extremity Assessment: Defer to PT evaluation RLE Deficits / Details: tingling to light touch B feet RLE Sensation: WNL LLE Deficits / Details: tingling to light touch B feet LLE Sensation: WNL   Cervical / Trunk Assessment Cervical / Trunk Assessment: Normal   Communication Communication Communication: No apparent difficulties   Cognition Arousal: Lethargic Behavior During Therapy: Flat affect Cognition: Difficult to  assess Difficult to assess due to: Level of arousal           OT - Cognition Comments: cognition WFL; pt generally appearing unwell and lethargic. requires increased time to process with flat affect. decreased insight into situation and deficits.                 Following commands: Impaired Following commands impaired: Follows one step commands with increased time     Cueing  General Comments   Cueing Techniques: Verbal cues;Tactile cues  VSS on 4L O2 (2L at home)           Home Living Family/patient expects to be discharged to:: Private residence Living Arrangements: Children;Spouse/significant other Available Help at Discharge: Family;Available 24 hours/day Type of Home: House Home Access: Stairs to enter [employer] of Steps: 3 Entrance Stairs-Rails: Right;Left;Can reach both Home Layout: One level     Bathroom Shower/Tub: Producer, television/film/video: Handicapped height     Home Equipment: Agricultural consultant (2 wheels);[place] - single point   Additional Comments: lives with spouse who has dementia and daughter. Other daughter assists too.  1 step down to kitchen      Prior Functioning/Environment Prior Level of Function : Independent/Modified Independent             Mobility Comments: 2 falls in past 6 months 2* fatigue from cancer treatments ADLs Comments: independent until a few days ago    OT Problem List: Decreased strength;Decreased range of motion;Decreased activity tolerance;Impaired balance (sitting and/or standing);Decreased knowledge of use of DME or AE;Decreased knowledge of precautions;Cardiopulmonary status limiting activity   OT Treatment/Interventions: Self-care/ADL training;Therapeutic exercise;Neuromuscular education;Energy conservation;DME and/or AE instruction;Therapeutic activities;Patient/family education;Balance training      OT Goals(Current goals can be found in the care plan section)   Acute Rehab  OT  Goals Patient Stated Goal: go home OT Goal Formulation: With patient/family Time For Goal Achievement: 07/15/24 Potential to Achieve Goals: Fair   OT Frequency:  Min 2X/week    Co-evaluation PT/OT/SLP Co-Evaluation/Treatment: Yes Reason for Co-Treatment: For patient/therapist safety;To address functional/ADL transfers PT goals addressed during session: Mobility/safety with mobility;Balance;Proper use of DME;Strengthening/ROM OT goals addressed during session: ADL's and self-care      AM-PAC OT 6 Clicks Daily Activity     Outcome Measure Help from another person eating meals?: A Little Help from another person taking care of personal grooming?: A Little Help from another person toileting, which includes using toliet, bedpan, or urinal?: A Lot Help from another person bathing (including washing, rinsing, drying)?: A Lot Help from another person to put on and taking off regular upper body clothing?: A Lot Help from another person to put on and taking off regular lower body clothing?: A Lot 6 Click Score: 14   End of Session Equipment Utilized During Treatment: Gait belt;Rolling walker (2 wheels);Oxygen  Nurse Communication: Mobility status  Activity Tolerance: Patient limited by fatigue Patient left: in chair;with call bell/phone within reach;with chair alarm set;with family/visitor present  OT Visit Diagnosis: Unsteadiness on feet (R26.81);Other abnormalities of gait and mobility (R26.89);Repeated falls (R29.6);Muscle weakness (generalized) (M62.81);History of falling (Z91.81)                Time: 9149-9079 OT Time Calculation (min): 30 min Charges:  OT General Charges $OT Visit: 1 Visit OT Evaluation $OT Eval Low Complexity: 1 Low OT Treatments $Self Care/Home Management : 8-22 mins  [place] Duerst L. Alphonse Asbridge, OTR/L  07/01/24, 1:14 PM

## 2024-07-01 NOTE — Consult Note (Signed)
 WOC Nurse Consult Note: Reason for Consult: LE wounds/ Buttocks  History of metastatic adenocarcinoma bones and muscles, admitted for fever  Wound type: Stage 2 left buttock; 100% pink Stage 2 right buttock; 100% clean, pink  Multiple partial thickness wounds on the bilateral LEs ; all are clean and pink, nothing appears infected or necrotic  Pressure Injury POA: Yes Measurement: see nursing flow sheets Wound bed:see above  Drainage (amount, consistency, odor) see nursing flow sheets Periwound:intact  Dressing procedure/placement/frequency: Silicone foam dressings to the buttock wounds,      change every 3 days. ASSESS UNDER dressings each shift for any acute changes in the wounds.   Cleanse LE wounds with saline, pat dry. Cover with single layer of xeroform top with foam. Change every other day    Re consult if needed, will not follow at this time. Thanks  Alannie Amodio [employer], RN,CWOCN, CNS, [employer] [phone]

## 2024-07-01 NOTE — Evaluation (Signed)
 Physical Therapy Evaluation Patient Details Name: Joshua Compton. MRN: [id number] DOB: [birth year] Today's Date: 07/01/2024  History of Present Illness  83 y.o. male presented from the cancer center with concerns for neutropenic fever. 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  Clinical Impression  Pt admitted with above diagnosis. Min assist for bed mobility, min assist to stand and pivot to bedside commode, then to recliner with RW, pt on 4L O2. Ambulation not attempted 2* fatigue with transfers. Pt put forth good effort. At baseline he ambulates independently with a RW. He has had a significant decline in activity tolerance. Patient will benefit from continued inpatient follow up therapy, <3 hours/day.  Pt currently with functional limitations due to the deficits listed below (see PT Problem List). Pt will benefit from acute skilled PT to increase their independence and safety with mobility to allow discharge.           If plan is discharge home, recommend the following: A lot of help with bathing/dressing/bathroom;Assistance with cooking/housework;Assist for transportation;Help with stairs or ramp for entrance;A lot of help with walking and/or transfers   Can travel by private vehicle   Yes    Equipment Recommendations Wheelchair (measurements PT);Wheelchair cushion (measurements PT)  Recommendations for Other Services       Functional Status Assessment Patient has had a recent decline in their functional status and demonstrates the ability to make significant improvements in function  in a reasonable and predictable amount of time.     Precautions / Restrictions Precautions Precautions: Fall Recall of Precautions/Restrictions: Intact Precaution/Restrictions Comments: 2 falls in past 6 months Restrictions Weight Bearing Restrictions Per Provider Order: No      Mobility  Bed Mobility Overal bed mobility: Needs Assistance Bed Mobility: Supine to Sit     Supine to sit: Min assist     General bed mobility comments: assist to pivot hips, VCs for technique    Transfers Overall transfer level: Needs assistance Equipment used: Rolling walker (2 wheels) Transfers: Sit to/from Stand, Bed to chair/wheelchair/BSC Sit to Stand: Min assist   Step pivot transfers: Min assist       General transfer comment: Min A to power up and for hand placement on RW; SPT x 2 to bedside commode and then to recliner    Ambulation/Gait                  Stairs            Wheelchair Mobility     Tilt Bed    Modified Rankin (Stroke Patients Only)       Balance Overall balance assessment: Needs assistance Sitting-balance support: Feet supported, No upper extremity supported Sitting balance-Leahy Scale: Fair     Standing balance support: Bilateral upper extremity supported, During functional activity, Reliant on assistive device for balance Standing balance-Leahy Scale: Poor                               Pertinent Vitals/Pain Pain Assessment Pain Assessment: Faces Faces Pain Scale: Hurts little more Pain Location: throat Pain Descriptors / Indicators: Sore Pain Intervention(s): Limited  activity within patient's tolerance, Monitored during session    Home Living Family/patient expects to be discharged to:: Private residence Living Arrangements: Children;Spouse/significant other Available Help at Discharge: Family;Available 24 hours/day Type of Home: House Home Access: Stairs to enter Entrance Stairs-Rails: Right;Left;Can reach  both Entrance Stairs-Number of Steps: 3   Home Layout: One level Home Equipment: Agricultural consultant (2 wheels);[place] - single point Additional Comments: lives with spouse who has dementia and daughter. Other daughter assists too.  1 step down to kitchen    Prior Function Prior Level of Function : Independent/Modified Independent             Mobility Comments: 2 falls in past 6 months 2* fatigue from cancer treatments ADLs Comments: independent until a few days ago     Extremity/Trunk Assessment   Upper Extremity Assessment Upper Extremity Assessment: Defer to OT evaluation    Lower Extremity Assessment Lower Extremity Assessment: Overall WFL for tasks assessed;RLE deficits/detail;LLE deficits/detail RLE Deficits / Details: tingling to light touch B feet RLE Sensation: WNL LLE Deficits / Details: tingling to light touch B feet LLE Sensation: WNL    Cervical / Trunk Assessment Cervical / Trunk Assessment: Normal  Communication   Communication Communication: No apparent difficulties    Cognition Arousal: Lethargic Behavior During Therapy: WFL for tasks assessed/performed   PT - Cognitive impairments: No apparent impairments                         Following commands: Impaired Following commands impaired: Follows one step commands with increased time     Cueing Cueing Techniques: Verbal cues, Tactile cues     General Comments      Exercises     Assessment/Plan    PT Assessment Patient needs continued PT services  PT Problem List Decreased activity tolerance;Decreased balance;Decreased mobility       PT Treatment Interventions Gait training;Therapeutic activities;Stair training;Therapeutic exercise;Balance training;Functional mobility training    PT Goals (Current goals can be found in the Care Plan section)  Acute Rehab PT Goals Patient Stated Goal: likes to go fishing PT Goal Formulation: With patient/family Time For Goal Achievement:  07/15/24 Potential to Achieve Goals: Good    Frequency Min 3X/week     Co-evaluation PT/OT/SLP Co-Evaluation/Treatment: Yes Reason for Co-Treatment: For patient/therapist safety;To address functional/ADL transfers PT goals addressed during session: Mobility/safety with mobility;Balance;Proper use of DME;Strengthening/ROM         AM-PAC PT 6 Clicks Mobility  Outcome Measure Help needed turning from your back to your side while in a flat bed without using bedrails?: A Little Help needed moving from lying on your back to sitting on the side of a flat bed without using bedrails?: A Little Help needed moving to and from a bed to a chair (including a wheelchair)?: A Little Help needed standing up from a chair using your arms (e.g., wheelchair or bedside chair)?: A Little Help needed to walk in hospital room?: A Little Help needed climbing 3-5 steps with a railing? : A Lot 6 Click Score: 17    End of Session Equipment Utilized During Treatment: Gait belt Activity Tolerance: Patient tolerated treatment well;Patient limited by fatigue Patient left: in chair;with chair alarm set;with family/visitor present;with call bell/phone within reach Nurse Communication: Mobility status PT Visit Diagnosis: Difficulty in walking, not elsewhere classified (R26.2)    Time: 9151-9078 PT Time Calculation (min) (ACUTE ONLY): 33 min   Charges:   PT Evaluation $PT Eval Moderate Complexity: 1 Mod  PT General Charges $$ ACUTE PT VISIT: 1 Visit         Sylvan Delon Copp PT 07/01/2024  Acute Rehabilitation Services  Office [phone]

## 2024-07-01 NOTE — Progress Notes (Signed)
 PHARMACY - PHYSICIAN COMMUNICATION CRITICAL VALUE ALERT - BLOOD CULTURE IDENTIFICATION (BCID)  Joshua Compton. is an 83 y.o. male who presented to [facility] on 06/30/2024 with a chief complaint of fever.  Assessment:  Pt admitted with febrile neutropenia (ANC 0, Tmax 101.7 F). Initiated on broad spectrum antibiotics.   BCID + 2/2 bottles growing Enterococcus faecalis, E.coli, and Klebsiella aerogenes.   Name of physician (or Provider) Contacted: Dr. Sebastian  Enterococcus bacteremia should generate auto-ID consult as well  Current antibiotics: vancomycin  + cefepime   Changes to prescribed antibiotics recommended: Continue cefepime  to cover E.coli and Klebsiella aerogenes, narrow vancomycin  to ampicillin  to cover E. Faecalis.   Results for orders placed or performed in visit on 06/30/24  Blood Culture ID Panel (Reflexed) (Collected: 06/30/2024  3:18 PM)  Result Value Ref Range   Enterococcus faecalis DETECTED (A) NOT DETECTED   Enterococcus Faecium NOT DETECTED NOT DETECTED   Listeria monocytogenes NOT DETECTED NOT DETECTED   Staphylococcus species NOT DETECTED NOT DETECTED   Staphylococcus aureus (BCID) NOT DETECTED NOT DETECTED   Staphylococcus epidermidis NOT DETECTED NOT DETECTED   Staphylococcus lugdunensis NOT DETECTED NOT DETECTED   Streptococcus species NOT DETECTED NOT DETECTED   Streptococcus agalactiae NOT DETECTED NOT DETECTED   Streptococcus pneumoniae NOT DETECTED NOT DETECTED   Streptococcus pyogenes NOT DETECTED NOT DETECTED   A.calcoaceticus-baumannii NOT DETECTED NOT DETECTED   Bacteroides fragilis NOT DETECTED NOT DETECTED   Enterobacterales DETECTED (A) NOT DETECTED   Enterobacter cloacae complex NOT DETECTED NOT DETECTED   Escherichia coli DETECTED (A) NOT DETECTED   Klebsiella aerogenes DETECTED (A) NOT DETECTED   Klebsiella oxytoca NOT DETECTED NOT DETECTED   Klebsiella pneumoniae NOT DETECTED NOT DETECTED   Proteus species NOT DETECTED NOT DETECTED    Salmonella species NOT DETECTED NOT DETECTED   Serratia marcescens NOT DETECTED NOT DETECTED   Haemophilus influenzae NOT DETECTED NOT DETECTED   Neisseria meningitidis NOT DETECTED NOT DETECTED   Pseudomonas aeruginosa NOT DETECTED NOT DETECTED   Stenotrophomonas maltophilia NOT DETECTED NOT DETECTED   Candida albicans NOT DETECTED NOT DETECTED   Candida auris NOT DETECTED NOT DETECTED   Candida glabrata NOT DETECTED NOT DETECTED   Candida krusei NOT DETECTED NOT DETECTED   Candida parapsilosis NOT DETECTED NOT DETECTED   Candida tropicalis NOT DETECTED NOT DETECTED   Cryptococcus neoformans/gattii NOT DETECTED NOT DETECTED   CTX-M ESBL NOT DETECTED NOT DETECTED   Carbapenem resistance IMP NOT DETECTED NOT DETECTED   Carbapenem resistance KPC NOT DETECTED NOT DETECTED   Carbapenem resistance NDM NOT DETECTED NOT DETECTED   Carbapenem resist OXA 48 LIKE NOT DETECTED NOT DETECTED   Vancomycin  resistance NOT DETECTED NOT DETECTED   Carbapenem resistance VIM NOT DETECTED NOT DETECTED    Ronal CHRISTELLA Rav, PharmD 07/01/2024  9:56 AM

## 2024-07-01 NOTE — Progress Notes (Signed)
 Hematology/Oncology Consult Note  Clinical Summary: Joshua Compton is an 83 year old male with medical history significant for metastatic adenocarcinoma of unclear primary who is currently admitted with neutropenic fever, bacteremia, and hemoptysis.  Interval History: Mr. Joshua Compton was previously seen in our clinic prior to his start of chemotherapy.  His first dose of chemotherapy was administered on 06/24/2024.  He subsequently received G-CSF shot on 06/27/2024.  He presented to the hospital yesterday with somnolence and fevers with temperature raising as high as 100.8.  He is also had cough productive of brownish sputum.  Due to concern for these findings the patient was admitted for further evaluation and management.  Blood cultures have subsequently been positive for Enterococcus faecalis, E. coli, and Klebsiella.  On exam today Mr. Joshua Compton is accompanied by his daughter.  He is alert and answers questions appropriately.  He is receiving 4 L of oxygen  via nasal cannula.  He notes he is not currently in any pain.  There is bright red blood in the suction bin.  He has not been eating much and reports he is very fatigued.  He is not currently having fevers.  He has been moved to the ICU for further close management.  Today we had a discussion about his situation.  I expressed my concern that chemotherapy may be causing him more harm than good.  Additionally he is critically ill and we would need him to recover from his multiple issues prior to consideration of restarting chemotherapy.  I strongly encouraged the patient to continue to talk with the palliative care service.  I do think that comfort based care could be considered if he does not make a considerable recovery from his current hospitalization.  The patient and his family voiced understanding of our findings and plan moving forward.  O:  Vitals:   07/01/24 1937 07/01/24 2006  BP: (!) 135/34   Pulse: 73   Resp: (!) 22   Temp: 99.5  F (37.5 C)   SpO2:  94%      Latest Ref Rng & Units 07/01/2024    4:41 AM 06/30/2024    2:24 PM 06/21/2024    1:29 PM  CMP  Glucose 70 - 99 mg/dL 886  890  94   BUN 8 - 23 mg/dL 19  16  15    Creatinine 0.61 - 1.24 mg/dL 8.78  8.95  8.92   Sodium 135 - 145 mmol/L 137  140  139   Potassium 3.5 - 5.1 mmol/L 2.8  2.9  3.1   Chloride 98 - 111 mmol/L 99  100  103   CO2 22 - 32 mmol/L 28  33  29   Calcium  8.9 - 10.3 mg/dL 8.1  8.5  9.1   Total Protein 6.5 - 8.1 g/dL 5.3  5.7  6.2   Total Bilirubin 0.0 - 1.2 mg/dL 1.1  1.1  1.0   Alkaline Phos 38 - 126 U/L 83  105  116   AST 15 - 41 U/L 41  45  23   ALT 0 - 44 U/L 49  57  22       Latest Ref Rng & Units 07/01/2024    4:41 AM 06/30/2024    2:24 PM 06/21/2024    1:29 PM  CBC  WBC 4.0 - 10.5 K/uL 0.2  0.2  7.1   Hemoglobin 13.0 - 17.0 g/dL 7.1  8.4  89.5   Hematocrit 39.0 - 52.0 % 22.3  24.8  31.9  Platelets 150 - 400 K/uL 57  67  158       GENERAL: Chronically ill-appearing elderly African-American male in NAD  SKIN: skin color, texture, turgor are normal, no rashes or significant lesions EYES: conjunctiva are pink and non-injected, sclera clear LUNGS: clear to auscultation and percussion with normal breathing effort HEART: regular rate & rhythm and no murmurs and no lower extremity edema Musculoskeletal: no cyanosis of digits and no clubbing  PSYCH: alert & oriented x 3, fluent speech NEURO: no focal motor/sensory deficits  Assessment/Plan:  # Neutropenic Fever # Bacteremia 2/2 to E. coli, Enterococcus bacillus, and Klebsiella  -- Agree with ICU admission and antibiotic therapy per primary team. -- Appreciate discussions with palliative care team and their assistance with this case. -- Patient received G-CSF after treatment, no further G-CSF required in hospital. -- Labs today show white blood cell 0.2, hemoglobin 7.1, platelets 57, ANC 0  # Hemoptysis -- Recommend transfusion for hemoglobin less than 8.0 and platelets less  than 20 -- Recommend a one-time transfusion of platelets to see if this assists with his hemoptysis.  Unlikely the thrombocytopenia is causing his hemoptysis, most likely it is a combination of his thrombocytopenia with Plavix  treatment. -- Unable to stop Plavix  due to recent stent placement. -- Continue with transfusion support and close clinical monitoring.   # Metastatic Adenocarcinoma of Unknown Primary -- Patient started cycle 1 day 1 of carboplatin  and paclitaxel  chemotherapy on 06/24/2024. -- A lot of time his past to note the chemotherapy has been effective at this point. -- Next likely chemotherapy is tentatively scheduled for 07/15/2024. -- Will continue to monitor to see if he is responding to therapy or remains a candidate for treatment. -- I do think goals of care conversations are appropriate this time.  Unlikely patient will be a candidate for another round of chemotherapy, however would like to give him time to recover from his current infection and hospitalization.   Norleen IVAR Kidney, MD Department of Hematology/Oncology [facility] at [facility] Phone: [phone] Pager: [phone] Email: [email or web address]

## 2024-07-01 NOTE — Consult Note (Signed)
 Consultation Note Date: 07/01/2024   Patient Name: Joshua Compton.  DOB: 1941/02/16  MRN: [id number]  Age / Sex: 83 y.o., male   PCP: [name], MD Referring Physician: Sebastian Toribio GAILS, MD  Reason for Consultation: Establishing goals of care     Chief Complaint/History of Present Illness:   Patient is an 83 year old male with a past medical history of paroxysmal A-fib on chronic anticoagulation with Eliquis , history of CVA, HFpEF, CAD with recent NSTEMI 05/2024 status post DES to left circumflex on dual antiplatelet therapy of Eliquis  and Plavix , 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  Since admission patient is receiving management for neutropenic fever with noted bacteremia secondary to E. coli, Enterococcus bacillus, and Klebsiella; hemoptysis; pancytopenia; electrolyte abnormalities; metastatic cancer; stage II wound on left and right buttocks.  ID and oncology consulted for recommendations.  Palliative medicine team consulted to assist with complex medical decision making. Of note patient seen by PMT during hospitalization in July 2025.  Extensive review of EMR including recent documentation from ID, PT/OT, WOC, hospitalist, and cardiologist.  With patient's recent DES placement, must remain on Plavix  despite hemoptysis.  Eliquis  has been stopped at this time.  Discussed care with hospitalist and RN for medical updates.  Patient transferred to stepdown-ICU, today due to multiple medical conditions deteriorating today.  Recent CMP noting potassium 2.8 and albumin  2.1.  Recent CBC noting WBC 0.2, hemoglobin 7.1, hematocrit 22.3, and platelets 57. Personally reviewed chest x-ray from today noting  progressive right over left interstitial airspace disease suspicious for pneumonia.    Presented to bedside to see patient.  Patient lying in bed.  Patient's daughter, Joshua Compton, present at bedside.  Joshua Compton able to place her sister on speaker phone for conversation.  Patient unable to participate in discussion due to lethargy noted during conversation and was consistently falling asleep despite attempts to engage patient in conversation.  Daughters were able to update this provider about medical updates they had heard.  Discussed seriousness of patient's multiple medical illnesses including bacteremia, hemoptysis, possible pneumonia, and underlying metastatic cancer.  Patient currently receiving appropriate medical interventions to assist with management.  Noted ID following along to navigate assist in use of antibiotics.  Spent time answering questions about current medical illness as able.  Tried to inquire about what brings patient joy outside of the hospital.  Daughters noted that patient used to enjoy golfing and fishing.  They noted unfortunately patient has not been able to do either for quite some time now.  They noted that patient did enjoy time with family at home.  Daughter Joshua Compton takes care of patient as well as her mother who has underlying dementia.  Empathized with difficult situation.  They expressed how patient has deteriorated so quickly over less than a year and more recently in the past couple of months.  They still are trying to process all of this information.  Acknowledged difficulties with this.  With permission, discussed pathways for care moving forward.  Noted patient currently receiving aggressive medical interventions.  Concerned that based on current evaluation, patient would not tolerate further cancer directed therapies if survives hospitalization.  Noted would defer expertise regarding this to Dr. Federico who has been involved in patient's care.  Expressed concern of  how patient  can rapidly deteriorate in ICU.  Noted that should patient deteriorate, would recommend transition to comfort focused care at that time.  If patient deteriorated, would anticipate in-hospital death or possible transfer to inpatient hospice if stable.  Daughters acknowledged and agreed that they would not want patient to suffer if he was at the end of life. Daughters noted that patient had already stated that to them that he would not want to go to rehab to regain strength.  Patient has stated that he wants to go home.  Discussed generalities related to what hospice care would and would not support at home.  Recommended that daughters consider would patient being at home surrounded by his loved ones be the most important goal at this point.  If so would recommend considering to get patient home with hospice as soon as can be accommodated.  Daughters to further discuss. At this time we will continue appropriate medical interventions while allowing time for outcomes.  Again noted that should patient deteriorate overnight, recommend staff reach out to family and note recommendation to transition to comfort focused care at that time.  Noted this provider would follow-up with daughters tomorrow to continue conversations while allowing them time to discuss care today.  All questions answered at that time.  Noted palliative medicine team to continue following with patient's medical journey.  Discussed care with hospitalist, RN, and oncologist to coordinate care. Primary Diagnoses  Present on Admission:  Chronic diastolic CHF (congestive heart failure) (HCC)  Type 2 diabetes mellitus with hyperlipidemia (HCC)  Adenocarcinoma of unknown primary (HCC)  NSTEMI (non-ST elevated myocardial infarction) (HCC)  Cancer, metastatic to bone (HCC)  Chronic kidney disease, stage 3a (HCC)  Venous insufficiency of both lower extremities  Centrilobular emphysema (HCC)  Cerebrovascular accident (CVA) due to embolism of left  middle cerebral artery (HCC)  PAF (paroxysmal atrial fibrillation) (HCC)  Gastroesophageal reflux disease with esophagitis without hemorrhage  Neutropenic fever (HCC)  Past Medical History:  Diagnosis Date   Acute heart failure with preserved ejection fraction (HFpEF) (HCC) 09/05/2023   Acute HFrEF (heart failure with reduced ejection fraction) (HCC) 09/08/2023   Acute respiratory failure with hypoxia (HCC) 09/05/2023   Allergy    Arthritis    Cancer (HCC)    prostate ca   seed implants   CHF (congestive heart failure) (HCC)    GERD (gastroesophageal reflux disease)    Glaucoma    Hyperlipidemia    Hypertension    dr cyrena     [place]   fm   Stroke [facility]) 07/2023   Social History   Socioeconomic History   Marital status: Married    Spouse name: Not on file   Number of children: 5   Years of education: 11   Highest education level: 11th grade  Occupational History   Occupation: Nutritional therapist    Comment: retired  Tobacco Use   Smoking status: Former    Current packs/day: 0.00    Average packs/day: 0.5 packs/day for 30.0 years (15.0 ttl pk-yrs)    Types: Cigarettes    Start date: 03/29/1975    Quit date: 03/28/2005    Years since quitting: 19.2   Smokeless tobacco: Never  Vaping Use   Vaping status: Never Used  Substance and Sexual Activity   Alcohol use: No   Drug use: Never   Sexual activity: Not Currently    Birth control/protection: None  Other Topics Concern   Not on file  Social History Narrative  Married. 1 daughter and 1 son from current marriage. 3 daughters from prior marriage. 10 grandkids      Retired Proofreader: fishing, golf, enjoys being outdoors   Social Drivers of Corporate investment banker Strain: Low Risk  (05/26/2024)   Overall Financial Resource Strain (CARDIA)    Difficulty of Paying Living Expenses: Not hard at all  Food Insecurity: No Food Insecurity (06/30/2024)   Hunger Vital Sign     Worried About Running Out of Food in the Last Year: Never true    Ran Out of Food in the Last Year: Never true  Transportation Needs: No Transportation Needs (06/30/2024)   PRAPARE - Administrator, Civil Service (Medical): No    Lack of Transportation (Non-Medical): No  Physical Activity: Inactive (05/26/2024)   Exercise Vital Sign    Days of Exercise per Week: 0 days    Minutes of Exercise per Session: Not on file  Stress: No Stress Concern Present (05/26/2024)   [employer] of Occupational Health - Occupational Stress Questionnaire    Feeling of Stress: Not at all  Social Connections: Socially Integrated (06/30/2024)   Social Connection and Isolation Panel    Frequency of Communication with Friends and Family: More than three times a week    Frequency of Social Gatherings with Friends and Family: Twice a week    Attends Religious Services: More than 4 times per year    Active Member of [employer] or Organizations: Yes    Attends Banker Meetings: 1 to 4 times per year    Marital Status: Married  Recent Concern: Social Connections - Moderately Isolated (05/26/2024)   Social Connection and Isolation Panel    Frequency of Communication with Friends and Family: Once a week    Frequency of Social Gatherings with Friends and Family: Once a week    Attends Religious Services: Patient declined    Database administrator or Organizations: Yes    Attends Engineer, structural: 1 to 4 times per year    Marital Status: Married   Family History  Problem Relation Age of Onset   Cancer Mother        breast   Heart disease Father    Stroke Sister    Hypertension Sister    Healthy Sister    Heart attack Sister    Other Sister        unknown cause   Cancer Sister    Other Brother        multiple back surgeries   Hypertension Brother    Throat cancer Brother    Cancer Brother        groin related cancer   Heart attack Brother    Atrial fibrillation Daughter     Insurance account manager Parkinson White syndrome Daughter    Cancer Son        luekimia   Heart disease Paternal Uncle    Other Brother        suicide-   Colon cancer Neg Hx    Rectal cancer Neg Hx    Stomach cancer Neg Hx    Scheduled Meds:  sodium chloride    Intravenous Once   sodium chloride    Intravenous Once   acetaminophen   650 mg Oral Once   amiodarone   200 mg Oral Daily   atorvastatin   40 mg Oral Daily   budesonide  (PULMICORT ) nebulizer solution  0.5 mg Nebulization BID  Chlorhexidine  Gluconate Cloth  6 each Topical Daily   cholecalciferol   1,000 Units Oral Daily   clopidogrel   75 mg Oral Q breakfast   dorzolamide -timolol   1 drop Both Eyes Daily   filgrastim  (NIVESTYM ) SQ  480 mcg Subcutaneous q1800   furosemide   20 mg Intravenous Once   guaiFENesin   1,200 mg Oral BID   insulin  aspart  0-9 Units Subcutaneous TID WC   ipratropium  0.5 mg Nebulization Q8H   levalbuterol   0.63 mg Nebulization Q8H   metoprolol  succinate  12.5 mg Oral Daily   oxyCODONE   10 mg Oral Q12H   pantoprazole  (PROTONIX ) IV  40 mg Intravenous Q12H   potassium chloride   40 mEq Oral Q4H   rOPINIRole   1 mg Oral TID   sacubitril -valsartan   1 tablet Oral BID   sodium chloride  flush  10-40 mL Intracatheter Q12H   torsemide   20 mg Oral Daily   Continuous Infusions:  sodium chloride      ampicillin  (OMNIPEN) IV     ceFEPime  (MAXIPIME ) IV 2 g (07/01/24 0701)   PRN Meds:.sodium chloride , acetaminophen  **OR** [DISCONTINUED] acetaminophen , hydrALAZINE , HYDROmorphone  (DILAUDID ) injection, levalbuterol , methocarbamol  (ROBAXIN ) injection, ondansetron  **OR** ondansetron  (ZOFRAN ) IV, oxyCODONE , phenol, sorbitol  Allergies  Allergen Reactions   Iodinated Contrast Media Anaphylaxis, Swelling and Other (See Comments)    Tongue swelled when he had MRI in [facility]  Intubated for airway protection after oral and throat swelling   Pravastatin  Other (See Comments)    Muscle aches   CBC:    Component Value Date/Time   WBC  0.2 (LL) 07/01/2024 0441   HGB 7.1 (L) 07/01/2024 0441   HGB 8.4 (L) 06/30/2024 1424   HGB 14.5 04/12/2024 1205   HCT 22.3 (L) 07/01/2024 0441   HCT 44.5 04/12/2024 1205   PLT 57 (L) 07/01/2024 0441   PLT 67 (L) 06/30/2024 1424   PLT 300 04/12/2024 1205   MCV 93.3 07/01/2024 0441   MCV 94 04/12/2024 1205   NEUTROABS 0.0 (LL) 07/01/2024 0441   NEUTROABS 4.2 04/12/2024 1205   LYMPHSABS 0.1 (L) 07/01/2024 0441   LYMPHSABS 1.6 04/12/2024 1205   MONOABS 0.1 07/01/2024 0441   EOSABS 0.0 07/01/2024 0441   EOSABS 0.2 04/12/2024 1205   BASOSABS 0.0 07/01/2024 0441   BASOSABS 0.1 04/12/2024 1205   Comprehensive Metabolic Panel:    Component Value Date/Time   NA 137 07/01/2024 0441   NA 144 04/12/2024 1205   K 2.8 (L) 07/01/2024 0441   CL 99 07/01/2024 0441   CO2 28 07/01/2024 0441   BUN 19 07/01/2024 0441   BUN 26 04/12/2024 1205   CREATININE 1.21 07/01/2024 0441   CREATININE 1.04 06/30/2024 1424   GLUCOSE 113 (H) 07/01/2024 0441   CALCIUM  8.1 (L) 07/01/2024 0441   AST 41 07/01/2024 0441   AST 45 (H) 06/30/2024 1424   ALT 49 (H) 07/01/2024 0441   ALT 57 (H) 06/30/2024 1424   ALKPHOS 83 07/01/2024 0441   BILITOT 1.1 07/01/2024 0441   BILITOT 1.1 06/30/2024 1424   PROT 5.3 (L) 07/01/2024 0441   PROT 7.6 04/12/2024 1205   ALBUMIN  2.1 (L) 07/01/2024 0441   ALBUMIN  4.5 04/12/2024 1205    Physical Exam: Vital Signs: BP (!) 133/49   Pulse 88   Temp 97.6 F (36.4 C) (Oral)   Resp 16   Ht 6' (1.829 m)   Wt 85.6 kg   SpO2 94%   BMI 25.59 kg/m  SpO2: SpO2: 94 % O2 Device: O2 Device: Nasal Cannula O2  Flow Rate: O2 Flow Rate (L/min): 4 L/min Intake/output summary:  Intake/Output Summary (Last 24 hours) at 07/01/2024 1149 Last data filed at 07/01/2024 1000 Gross per 24 hour  Intake 966.87 ml  Output 577 ml  Net 389.87 ml   LBM: Last BM Date : 07/01/24 Baseline Weight: Weight: 85.6 kg Most recent weight: Weight: 85.6 kg  General: Lethargic, ill-appearing,  frail Cardiovascular: RRR Respiratory: Slightly increased work of breathing noted, not in respiratory distress Abdomen: not distended Neuro: Lethargic, not able to engage in complex medical decision making at time of visit         Palliative Performance Scale: 30%              Additional Data Reviewed: Recent Labs    06/30/24 1424 07/01/24 0441  WBC 0.2* 0.2*  HGB 8.4* 7.1*  PLT 67* 57*  NA 140 137  BUN 16 19  CREATININE 1.04 1.21    Imaging: DG CHEST PORT 1 VIEW CLINICAL DATA:  Hypoxia  EXAM: PORTABLE CHEST 1 VIEW  COMPARISON:  Yesterday  FINDINGS: Right Port-A-Cath tip at low SVC or superior caval/atrial junction.  Tracheal deviation left, consistent with known right-sided thyroid  mass. Borderline cardiomegaly. Atherosclerosis in the transverse aorta. No pleural effusion or pneumothorax. Underlying interstitial thickening is likely related to smoking/chronic bronchitis. More confluent right greater than left interstitial and airspace opacities again identified. Progressive, especially on the right.  IMPRESSION: Progressive right greater than left interstitial and airspace opacities, suspicious for pneumonia superimposed upon sequelae of smoking/chronic bronchitis.  Aortic Atherosclerosis (ICD10-I70.0).  Electronically Signed   By: Rockey Kilts M.D.   On: 07/01/2024 09:11    I personally reviewed recent imaging.   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  Since admission patient is receiving management for neutropenic fever with noted bacteremia secondary to E. coli, Enterococcus bacillus, and Klebsiella; hemoptysis; pancytopenia; electrolyte abnormalities; metastatic cancer; stage II wound on left and right buttocks.  ID and oncology consulted for recommendations.  Palliative medicine team consulted to assist with complex medical decision making. Of note patient seen by PMT during hospitalization in July 2025.  # Complex medical decision making/goals of care  - Unable to discuss complex medical decision making with patient due to his lethargy upon visit.  Discussed care with daughter Joshua Compton, at bedside and her sister on speaker phone as detailed above in HPI.  Discussed possible pathways for medical care moving forward.  Patient currently receiving aggressive medical interventions.  Did express concern how patient could rapidly deteriorate.  Noted that if patient continued to deteriorate and appeared at end-of-life, would recommend transition to comfort focused care at that time.  Noted would recommend him reach out to daughters to inform them of deterioration to make transition to comfort focused care.  Daughters acknowledged this.  Also discussed possibility of working to get patient home with hospice to enjoy time at home as patient has stated he  would want to be at home.  Daughters to consider this further.  At this time continuing appropriate medical interventions to allow time for outcomes.  Palliative medicine team will continue to engage in conversations as able and appropriate moving forward.  -  Code Status: Limited: Do not attempt resuscitation (DNR) -DNR-LIMITED -Do Not Intubate/DNI    # Psycho-social/Spiritual Support:  - Support System: Daughters  # Discharge Planning:  To Be Determined  Thank you  for allowing the palliative care team to participate in the care Osmel Haverland Jr.SABRA Tinnie Radar, DO Palliative Care Provider PMT # [phone]  If patient remains symptomatic despite maximum doses, please call PMT at [phone] between 0700 and 1900. Outside of these hours, please call attending, as PMT does not have night coverage.

## 2024-07-01 NOTE — Consult Note (Addendum)
 [facility]                                                                                        Patient Identification: Patient Name: Joshua Compton. MRN: [id number] Admit Date: 06/30/2024  4:57 PM Today's Date: 07/01/2024 Reason for consult: Bacteremia Requesting provider: CHAMP auto consult  Principal Problem:   Neutropenic fever (HCC) Active Problems:   Gastroesophageal reflux disease with esophagitis without hemorrhage   PAF (paroxysmal atrial fibrillation) (HCC)   Cerebrovascular accident (CVA) due to embolism of left middle cerebral artery (HCC)   Centrilobular emphysema (HCC)   Venous insufficiency of both lower extremities   Chronic kidney disease, stage 3a (HCC)   Cancer, metastatic to bone (HCC)   NSTEMI (non-ST elevated myocardial infarction) (HCC)   Adenocarcinoma of unknown primary (HCC)   History of CVA (cerebrovascular accident)   Chronic diastolic CHF (congestive heart failure) (HCC)   Type 2 diabetes mellitus with hyperlipidemia (HCC)   Pancytopenia (HCC)   Antibiotics Vancomycin  8/14- Cefepime  8/14-   Lines/Hardware: RT chest port , Bilateral hip arthroplasty  Assessment # Neutropenic fevers # Polymicrobial bacteremia - 8/14 blood cx 1 set only GPC and GNR, BCID as E faecalis, E coli and klebsiella aerogenes  - Sources considered: GI source some diarrhea reported PTA and loose stool here, mucosal translocation with recent chemotherapy,  Possible PNA, Port with no concerns.     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   Addendum 5: 31 pm TTE ordered as blood cx 8/14 with GPC in both sets, GNR in 1/2set, BCID with E faecalis.   Rest of the management as per the primary team. Please call with questions or concerns.  Thank you for the consult __________________________________________________________________________________________________________ HPI and Hospital Course: 83 year old male with prior history of paroxysmal A-fib on [facility], CVA, CAD with recent non-STEMI in July 2025 s/p DES on DAPT, CHFpEF, prostate ca s/p TURP, B/l Hip arthroplasty, metastatic adenocarcinoma involving the bones and muscles of unknown primary on carboplatin  and paclitaxel  q 3 weeks, thyroid  mass with pathology consistent with malignancy, chronic respiratory failure 2/2 COPD on 2 L home oxygen  who presented from the cancer center on 8/14 with concerns for neutropenic fever.  Patient had 3 days of fever with Tmax 100.8, chills, sore throat  as well as episode of loose stool with productive cough of brownish sputum, shortness of breath.  Denies nausea, vomiting or chest pain or abdominal pain, constipation, hematemesis or melena, increased urinary frequency, dysuria. Denies peripheral joint pain and swelling. Denies rashes. Denies any concerns at port.   At ED Tmax 101.7 Labs remarkable for K2.9 AST 45 ALT 57, WBC 0.2, ANC 0, hemoglobin 8.4, platelets 67 8/5 labs with WBC 7.1, hemoglobin 10.4, platelets 158  RVP flu panel negative UA cloudy  11-20 WBC 6-10 RBC and many bacteria, negative nitrite. Urine cx pending  8/14 blood cx GNR and GPC in pairs.  BCID with E faecalis, E. coli and Klebsiella aerogenes.   Chest Xray 8/14 1. Bibasilar patchy opacities, which may represent atelectasis, aspiration, or pneumonia. 2. Leftward deviation of the trachea, in keeping with known right thyroid  mass. 3. Unchanged cardiomediastinal silhouette.  Chest Xray 8/15 Progressive right greater than left interstitial and airspace opacities, suspicious for pneumonia superimposed upon sequelae of smoking/chronic bronchitis.   Aortic Atherosclerosis (ICD10-I70.0).  ROS: General- poor appetite + HEENT - Denies headache, blurry vision, neck pain, sinus pain Chest - Denies any chest pain CVS- Denies any dizziness/lightheadedness, syncopal attacks, palpitations Abdomen- Denies any nausea, vomiting, abdominal pain, hematochezia, loose stool Neuro - Denies any weakness, numbness, tingling sensation Psych - Denies any changes in mood irritability or depressive symptoms GU- Denies any burning, dysuria, hematuria or increased frequency of urination Skin - denies any rashes/lesions MSK - denies any joint pain/swelling or restricted ROM   Past Medical History:  Diagnosis Date   Acute heart failure with preserved ejection fraction (HFpEF) (HCC) 09/05/2023   Acute HFrEF (heart failure with reduced ejection fraction) (HCC) 09/08/2023   Acute respiratory failure with hypoxia (HCC) 09/05/2023   Allergy    Arthritis    Cancer (HCC)    prostate ca   seed implants   CHF (congestive heart failure) (HCC)    GERD (gastroesophageal reflux disease)    Glaucoma    Hyperlipidemia    Hypertension    dr cyrena     [place]   fm   Stroke [facility]) 07/2023   Past Surgical History:  Procedure Laterality Date   BACK SURGERY     x2   COLONOSCOPY     CORONARY IMAGING/OCT N/A 06/13/2024   Procedure: CORONARY IMAGING/OCT;  Surgeon: Anner Alm ORN, MD;  Location: [facility]  INVASIVE CV LAB;  Service: Cardiovascular;  Laterality: N/A;   CORONARY LITHOTRIPSY N/A 06/13/2024   Procedure: CORONARY LITHOTRIPSY;  Surgeon: Anner Alm ORN, MD;  Location: [facility] INVASIVE CV LAB;  Service: Cardiovascular;  Laterality: N/A;   CORONARY STENT INTERVENTION N/A 06/13/2024   Procedure: CORONARY STENT INTERVENTION;  Surgeon: Anner Alm ORN, MD;  Location: [facility] INVASIVE CV LAB;  Service: Cardiovascular;  Laterality: N/A;   EYE SURGERY     cataract   HERNIA REPAIR  11/06/2009   left inguinal repair    IR IMAGING GUIDED PORT INSERTION  06/20/2024   LEFT HEART CATH AND CORONARY ANGIOGRAPHY N/A 06/13/2024   Procedure: LEFT HEART CATH AND CORONARY ANGIOGRAPHY;  Surgeon: Anner Alm ORN, MD;  Location: [facility] INVASIVE CV LAB;  Service: Cardiovascular;  Laterality: N/A;   NECK SURGERY     ROTATOR CUFF REPAIR Left 11/2014   TOTAL HIP ARTHROPLASTY Right 04/20/2020   Procedure: RIGHT TOTAL HIP ARTHROPLASTY ANTERIOR APPROACH;  Surgeon: Jerri Kay HERO, MD;  Location: [facility] OR;  Service: Orthopedics;  Laterality: Right;   TOTAL HIP ARTHROPLASTY Left 01/07/2021  Procedure: LEFT TOTAL HIP ARTHROPLASTY ANTERIOR APPROACH;  Surgeon: Jerri Kay HERO, MD;  Location: [facility] OR;  Service: Orthopedics;  Laterality: Left;   TRANSURETHRAL RESECTION OF PROSTATE      Scheduled Meds:  sodium chloride    Intravenous Once   acetaminophen   650 mg Oral Once   amiodarone   200 mg Oral Daily   atorvastatin   40 mg Oral Daily   budesonide  (PULMICORT ) nebulizer solution  0.5 mg Nebulization BID   Chlorhexidine  Gluconate Cloth  6 each Topical Daily   cholecalciferol   1,000 Units Oral Daily   clopidogrel   75 mg Oral Q breakfast   dorzolamide -timolol   1 drop Both Eyes Daily   filgrastim  (NIVESTYM ) SQ  480 mcg Subcutaneous q1800   furosemide   20 mg Intravenous Once   guaiFENesin   1,200 mg Oral BID   insulin  aspart  0-9 Units Subcutaneous TID WC   ipratropium  0.5 mg Nebulization Q8H   levalbuterol   0.63 mg Nebulization Q8H    metoprolol  succinate  12.5 mg Oral Daily   oxyCODONE   10 mg Oral Q12H   pantoprazole  (PROTONIX ) IV  40 mg Intravenous Q12H   potassium chloride   40 mEq Oral Q4H   rOPINIRole   1 mg Oral TID   sacubitril -valsartan   1 tablet Oral BID   sodium chloride  flush  10-40 mL Intracatheter Q12H   torsemide   20 mg Oral Daily   Continuous Infusions:  sodium chloride      ceFEPime  (MAXIPIME ) IV 2 g (07/01/24 0701)   vancomycin      PRN Meds:.sodium chloride , acetaminophen  **OR** acetaminophen , hydrALAZINE , HYDROmorphone  (DILAUDID ) injection, levalbuterol , methocarbamol  (ROBAXIN ) injection, ondansetron  **OR** ondansetron  (ZOFRAN ) IV, oxyCODONE , phenol, sorbitol   Allergies  Allergen Reactions   Iodinated Contrast Media Anaphylaxis, Swelling and Other (See Comments)    Tongue swelled when he had MRI in [facility]  Intubated for airway protection after oral and throat swelling   Pravastatin  Other (See Comments)    Muscle aches   Social History   Socioeconomic History   Marital status: Married    Spouse name: Not on file   Number of children: 5   Years of education: 11   Highest education level: 11th grade  Occupational History   Occupation: Nutritional therapist    Comment: retired  Tobacco Use   Smoking status: Former    Current packs/day: 0.00    Average packs/day: 0.5 packs/day for 30.0 years (15.0 ttl pk-yrs)    Types: Cigarettes    Start date: 03/29/1975    Quit date: 03/28/2005    Years since quitting: 19.2   Smokeless tobacco: Never  Vaping Use   Vaping status: Never Used  Substance and Sexual Activity   Alcohol use: No   Drug use: Never   Sexual activity: Not Currently    Birth control/protection: None  Other Topics Concern   Not on file  Social History Narrative   Married. 1 daughter and 1 son from current marriage. 3 daughters from prior marriage. 10 grandkids      Retired Proofreader: fishing, golf, enjoys being outdoors   Social  Drivers of Corporate investment banker Strain: Low Risk  (05/26/2024)   Overall Financial Resource Strain (CARDIA)    Difficulty of Paying Living Expenses: Not hard at all  Food Insecurity: No Food Insecurity (06/30/2024)   Hunger Vital Sign    Worried About Running Out of Food in the Last Year: Never true    Ran Out of Food in  the Last Year: Never true  Transportation Needs: No Transportation Needs (06/30/2024)   PRAPARE - Administrator, Civil Service (Medical): No    Lack of Transportation (Non-Medical): No  Physical Activity: Inactive (05/26/2024)   Exercise Vital Sign    Days of Exercise per Week: 0 days    Minutes of Exercise per Session: Not on file  Stress: No Stress Concern Present (05/26/2024)   [employer] of Occupational Health - Occupational Stress Questionnaire    Feeling of Stress: Not at all  Social Connections: Socially Integrated (06/30/2024)   Social Connection and Isolation Panel    Frequency of Communication with Friends and Family: More than three times a week    Frequency of Social Gatherings with Friends and Family: Twice a week    Attends Religious Services: More than 4 times per year    Active Member of [employer] or Organizations: Yes    Attends Banker Meetings: 1 to 4 times per year    Marital Status: Married  Recent Concern: Social Connections - Moderately Isolated (05/26/2024)   Social Connection and Isolation Panel    Frequency of Communication with Friends and Family: Once a week    Frequency of Social Gatherings with Friends and Family: Once a week    Attends Religious Services: Patient declined    Database administrator or Organizations: Yes    Attends Banker Meetings: 1 to 4 times per year    Marital Status: Married  Catering manager Violence: Not At Risk (06/30/2024)   Humiliation, Afraid, Rape, and Kick questionnaire    Fear of Current or Ex-Partner: No    Emotionally Abused: No    Physically Abused: No     Sexually Abused: No   Family History  Problem Relation Age of Onset   Cancer Mother        breast   Heart disease Father    Stroke Sister    Hypertension Sister    Healthy Sister    Heart attack Sister    Other Sister        unknown cause   Cancer Sister    Other Brother        multiple back surgeries   Hypertension Brother    Throat cancer Brother    Cancer Brother        groin related cancer   Heart attack Brother    Atrial fibrillation Daughter    Insurance account manager Parkinson White syndrome Daughter    Cancer Son        luekimia   Heart disease Paternal Uncle    Other Brother        suicide-   Colon cancer Neg Hx    Rectal cancer Neg Hx    Stomach cancer Neg Hx    Vitals BP (!) 133/49   Pulse 88   Temp 97.6 F (36.4 C) (Oral)   Resp 16   Ht 6' (1.829 m)   Wt 85.6 kg   SpO2 94%   BMI 25.59 kg/m    Physical Exam Constitutional: Elderly male lying in the recliner, appears ill    Comments: HEENT WNL, mild posterior pharyngeal erythema but no enlarged tonsils or exudates, no oral ulcers  Cardiovascular:     Rate and Rhythm: Normal rate and regular rhythm.     Heart sounds: S1 and S2  Pulmonary:     Effort: Pulmonary effort is normal on 4 L nasal cannula    Comments: Rales at the  bases  Abdominal:     Palpations: Abdomen is soft.     Tenderness: Nontender and nondistended  Musculoskeletal:        General: No swelling or tenderness in peripheral joints including b/l prosthetic hip  Skin:    Comments: No rashes, rt chest port OK with no signs of infection   Neurological:     General: Awake, alert and oriented, grossly nonfocal  Psychiatric:        Mood and Affect: Mood normal.    Pertinent Microbiology Results for orders placed or performed during the hospital encounter of 06/30/24  Resp panel by RT-PCR (RSV, Flu A&B, Covid) Anterior Nasal Swab     Status: None   Collection Time: 06/30/24  7:58 PM   Specimen: Anterior Nasal Swab  Result Value Ref Range  Status   SARS Coronavirus 2 by RT PCR NEGATIVE NEGATIVE Final    Comment: (NOTE) SARS-CoV-2 target nucleic acids are NOT DETECTED.  The SARS-CoV-2 RNA is generally detectable in upper respiratory specimens during the acute phase of infection. The lowest concentration of SARS-CoV-2 viral copies this assay can detect is 138 copies/mL. A negative result does not preclude SARS-Cov-2 infection and should not be used as the sole basis for treatment or other patient management decisions. A negative result may occur with  improper specimen collection/handling, submission of specimen other than nasopharyngeal swab, presence of viral mutation(s) within the areas targeted by this assay, and inadequate number of viral copies(<138 copies/mL). A negative result must be combined with clinical observations, patient history, and epidemiological information. The expected result is Negative.  Fact Sheet for Patients:  BloggerCourse.com  Fact Sheet for Healthcare Providers:  SeriousBroker.it  This test is no t yet approved or cleared by the [place]  FDA and  has been authorized for detection and/or diagnosis of SARS-CoV-2 by FDA under an Emergency Use Authorization (EUA). This EUA will remain  in effect (meaning this test can be used) for the duration of the COVID-19 declaration under Section 564(b)(1) of the Act, 21 U.S.C.section 360bbb-3(b)(1), unless the authorization is terminated  or revoked sooner.       Influenza A by PCR NEGATIVE NEGATIVE Final   Influenza B by PCR NEGATIVE NEGATIVE Final    Comment: (NOTE) The Xpert Xpress SARS-CoV-2/FLU/RSV plus assay is intended as an aid in the diagnosis of influenza from Nasopharyngeal swab specimens and should not be used as a sole basis for treatment. Nasal washings and aspirates are unacceptable for Xpert Xpress SARS-CoV-2/FLU/RSV testing.  Fact Sheet for  Patients: BloggerCourse.com  Fact Sheet for Healthcare Providers: SeriousBroker.it  This test is not yet approved or cleared by the [place]  FDA and has been authorized for detection and/or diagnosis of SARS-CoV-2 by FDA under an Emergency Use Authorization (EUA). This EUA will remain in effect (meaning this test can be used) for the duration of the COVID-19 declaration under Section 564(b)(1) of the Act, 21 U.S.C. section 360bbb-3(b)(1), unless the authorization is terminated or revoked.     Resp Syncytial Virus by PCR NEGATIVE NEGATIVE Final    Comment: (NOTE) Fact Sheet for Patients: BloggerCourse.com  Fact Sheet for Healthcare Providers: SeriousBroker.it  This test is not yet approved or cleared by the [place]  FDA and has been authorized for detection and/or diagnosis of SARS-CoV-2 by FDA under an Emergency Use Authorization (EUA). This EUA will remain in effect (meaning this test can be used) for the duration of the COVID-19 declaration under Section 564(b)(1) of the Act, 21 U.S.C. section  360bbb-3(b)(1), unless the authorization is terminated or revoked.  Performed at [facility], [address] [place], [place] [address]   Respiratory (~20 pathogens) panel by PCR     Status: None   Collection Time: 06/30/24  7:58 PM   Specimen: Nasopharyngeal Swab; Respiratory  Result Value Ref Range Status   Adenovirus NOT DETECTED NOT DETECTED Final   Coronavirus 229E NOT DETECTED NOT DETECTED Final    Comment: (NOTE) The Coronavirus on the Respiratory Panel, DOES NOT test for the novel  Coronavirus (2019 nCoV)    Coronavirus HKU1 NOT DETECTED NOT DETECTED Final   Coronavirus NL63 NOT DETECTED NOT DETECTED Final   Coronavirus OC43 NOT DETECTED NOT DETECTED Final   Metapneumovirus NOT DETECTED NOT DETECTED Final   Rhinovirus / Enterovirus NOT  DETECTED NOT DETECTED Final   Influenza A NOT DETECTED NOT DETECTED Final   Influenza B NOT DETECTED NOT DETECTED Final   Parainfluenza Virus 1 NOT DETECTED NOT DETECTED Final   Parainfluenza Virus 2 NOT DETECTED NOT DETECTED Final   Parainfluenza Virus 3 NOT DETECTED NOT DETECTED Final   Parainfluenza Virus 4 NOT DETECTED NOT DETECTED Final   Respiratory Syncytial Virus NOT DETECTED NOT DETECTED Final   Bordetella pertussis NOT DETECTED NOT DETECTED Final   Bordetella Parapertussis NOT DETECTED NOT DETECTED Final   Chlamydophila pneumoniae NOT DETECTED NOT DETECTED Final   Mycoplasma pneumoniae NOT DETECTED NOT DETECTED Final    Comment: Performed at [facility], [address] [place], [place] [address]  Culture, blood (routine x 2) Call MD if unable to obtain prior to antibiotics being given     Status: None (Preliminary result)   Collection Time: 06/30/24  9:01 PM   Specimen: BLOOD  Result Value Ref Range Status   Specimen Description   Final    BLOOD BLOOD LEFT ARM Performed at [facility], [address] [place], [place] [address]    Special Requests   Final    BOTTLES DRAWN AEROBIC ONLY Blood Culture results may not be optimal due to an inadequate volume of blood received in culture bottles Performed at [facility], [address], [place], [place] [address]    Culture  Setup Time   Final    GRAM POSITIVE COCCI GRAM NEGATIVE RODS AEROBIC BOTTLE ONLY CRITICAL VALUE NOTED.  VALUE IS CONSISTENT WITH PREVIOUSLY REPORTED AND CALLED VALUE. Performed at [facility], [address] [place], [place] [address]    Culture GRAM POSITIVE COCCI GRAM NEGATIVE RODS   Final   Report Status PENDING  Incomplete  Culture, blood (routine x 2) Call MD if unable to obtain prior to antibiotics being given     Status: None (Preliminary result)   Collection Time: 06/30/24  9:01 PM   Specimen: BLOOD  Result Value Ref Range Status   Specimen  Description   Final    BLOOD BLOOD LEFT HAND Performed at [facility], [address], [place], [place] [address]    Special Requests   Final    BOTTLES DRAWN AEROBIC ONLY Blood Culture results may not be optimal due to an inadequate volume of blood received in culture bottles Performed at [facility], [address], [place], [place] [address]    Culture   Final    NO GROWTH < 12 HOURS Performed at [facility], [address], [place], [place] [address]    Report Status PENDING  Incomplete  Expectorated Sputum Assessment w Gram Stain, Rflx to Resp Cult     Status: None   Collection Time: 06/30/24 10:51 PM   Specimen: Expectorated Sputum  Result Value Ref Range Status   Specimen Description EXPECTORATED SPUTUM  Final   Special Requests NONE  Final   Sputum evaluation   Final    Sputum specimen not acceptable for testing.  Please recollect.   NOTIFIED JEREMY, RN @2326  ON 06/30/24 NT. Performed at [facility], [address], [place], [place] [address]    Report Status 06/30/2024 FINAL  Final  Expectorated Sputum Assessment w Gram Stain, Rflx to Resp Cult     Status: None   Collection Time: 07/01/24  2:45 AM   Specimen: SPU  Result Value Ref Range Status   Specimen Description SPUTUM  Final   Special Requests NONE  Final   Sputum evaluation   Final    Sputum specimen not acceptable for testing.  Please recollect.   NOTIFIED JEREMY RN OF RECOLLECT @ 0413 ON 07/01/2024 BY MTA Performed at [facility], [address] [place], [place] [address]    Report Status 07/01/2024 FINAL  Final   Pertinent Lab seen by me:    Latest Ref Rng & Units 07/01/2024    4:41 AM 06/30/2024    2:24 PM 06/21/2024    1:29 PM  CBC  WBC 4.0 - 10.5 K/uL 0.2  0.2  7.1   Hemoglobin 13.0 - 17.0 g/dL 7.1  8.4  89.5   Hematocrit 39.0 - 52.0 % 22.3  24.8  31.9   Platelets 150 - 400 K/uL 57  67  158       Latest Ref Rng &  Units 07/01/2024    4:41 AM 06/30/2024    2:24 PM 06/21/2024    1:29 PM  CMP  Glucose 70 - 99 mg/dL 886  890  94   BUN 8 - 23 mg/dL 19  16  15    Creatinine 0.61 - 1.24 mg/dL 8.78  8.95  8.92   Sodium 135 - 145 mmol/L 137  140  139   Potassium 3.5 - 5.1 mmol/L 2.8  2.9  3.1   Chloride 98 - 111 mmol/L 99  100  103   CO2 22 - 32 mmol/L 28  33  29   Calcium  8.9 - 10.3 mg/dL 8.1  8.5  9.1   Total Protein 6.5 - 8.1 g/dL 5.3  5.7  6.2   Total Bilirubin 0.0 - 1.2 mg/dL 1.1  1.1  1.0   Alkaline Phos 38 - 126 U/L 83  105  116   AST 15 - 41 U/L 41  45  23   ALT 0 - 44 U/L 49  57  22      Pertinent Imagings/Other Imagings Plain films and CT images have been personally visualized and interpreted; radiology reports have been reviewed. Decision making incorporated into the Impression / Recommendations.  DG CHEST PORT 1 VIEW Result Date: 07/01/2024 CLINICAL DATA:  Hypoxia EXAM: PORTABLE CHEST 1 VIEW COMPARISON:  Yesterday FINDINGS: Right Port-A-Cath tip at low SVC or superior caval/atrial junction. Tracheal deviation left, consistent with known right-sided thyroid  mass. Borderline cardiomegaly. Atherosclerosis in the transverse aorta. No pleural effusion or pneumothorax. Underlying interstitial thickening is likely related to smoking/chronic bronchitis. More confluent right greater than left interstitial and airspace opacities again identified. Progressive, especially on the right. IMPRESSION: Progressive right greater than left interstitial and airspace opacities, suspicious for pneumonia superimposed upon  sequelae of smoking/chronic bronchitis. Aortic Atherosclerosis (ICD10-I70.0). Electronically Signed   By: Rockey Kilts M.D.   On: 07/01/2024 09:11   DG Chest 2 View Result Date: 06/30/2024 EXAM: 2 VIEW(S) XRAY OF THE CHEST 06/30/2024 01:41:00 PM COMPARISON: 06/09/2024 CLINICAL HISTORY: SOB \\T \ fever- pt also states he is having slight mid chest pain, coughing up phlegm - Adenocarcinoma of unknown primary ;  Hx of PAF, CAD, recent NSTEMI, COPD FINDINGS: LINES AND TUBES: Right chest wall port tip terminates over the SVC. LUNGS AND PLEURA: Bibasilar patchy opacities. No pleural effusion. No pneumothorax. HEART AND MEDIASTINUM: Leftward deviation of the trachea, in keeping with known right thyroid  mass. Unchanged cardiomediastinal silhouette. BONES AND SOFT TISSUES: No acute osseous abnormality. IMPRESSION: 1. Bibasilar patchy opacities, which may represent atelectasis, aspiration, or pneumonia. 2. Leftward deviation of the trachea, in keeping with known right thyroid  mass. 3. Unchanged cardiomediastinal silhouette. Electronically signed by: Manford Cummins MD 06/30/2024 01:50 PM EDT RP Workstation: [id number]   I spent 85 minutes involved in face-to-face and non-face-to-face activities for this patient on the day of the visit. Professional time spent includes the following activities: Preparing to see the patient (review of tests), Obtaining and reviewing separately obtained history (H&P note, hospital progress note, oncology note), Performing a medically appropriate examination and evaluation , Ordering medications/labs, referring and communicating with other health care professionals, Documenting clinical information in the EMR, Independently interpreting results (not separately reported), Communicating results to the patient/family member, Counseling and educating the patient/family member and Care coordination (not separately reported).  Electronically signed by:   Plan d/w requesting provider as well as ID pharm D  Of note, portions of this note may have been created with voice recognition software. While this note has been edited for accuracy, occasional wrong-word or 'sound-a-like' substitutions may have occurred due to the inherent limitations of voice recognition software.   Annalee Orem, MD Infectious Disease Physician [facility] Pager: [phone]

## 2024-07-02 ENCOUNTER — Inpatient Hospital Stay (HOSPITAL_COMMUNITY)

## 2024-07-02 DIAGNOSIS — Z515 Encounter for palliative care: Secondary | ICD-10-CM | POA: Diagnosis not present

## 2024-07-02 DIAGNOSIS — R7881 Bacteremia: Secondary | ICD-10-CM

## 2024-07-02 DIAGNOSIS — I214 Non-ST elevation (NSTEMI) myocardial infarction: Secondary | ICD-10-CM | POA: Diagnosis not present

## 2024-07-02 DIAGNOSIS — K2100 Gastro-esophageal reflux disease with esophagitis, without bleeding: Secondary | ICD-10-CM | POA: Diagnosis not present

## 2024-07-02 DIAGNOSIS — I480 Paroxysmal atrial fibrillation: Secondary | ICD-10-CM | POA: Diagnosis not present

## 2024-07-02 DIAGNOSIS — D649 Anemia, unspecified: Secondary | ICD-10-CM

## 2024-07-02 DIAGNOSIS — D709 Neutropenia, unspecified: Secondary | ICD-10-CM | POA: Diagnosis not present

## 2024-07-02 DIAGNOSIS — R195 Other fecal abnormalities: Secondary | ICD-10-CM | POA: Diagnosis not present

## 2024-07-02 DIAGNOSIS — D61818 Other pancytopenia: Secondary | ICD-10-CM | POA: Diagnosis not present

## 2024-07-02 DIAGNOSIS — R042 Hemoptysis: Secondary | ICD-10-CM | POA: Diagnosis not present

## 2024-07-02 LAB — GASTROINTESTINAL PANEL BY PCR, STOOL (REPLACES STOOL CULTURE)

## 2024-07-02 LAB — CBC WITH DIFFERENTIAL/PLATELET
Abs Immature Granulocytes: 0.02 K/uL (ref 0.00–0.07)
Basophils Absolute: 0 K/uL (ref 0.0–0.1)
Basophils Relative: 3 %
Eosinophils Absolute: 0 K/uL (ref 0.0–0.5)
Eosinophils Relative: 11 %
HCT: 28.5 % — ABNORMAL LOW (ref 39.0–52.0)
Hemoglobin: 9.2 g/dL — ABNORMAL LOW (ref 13.0–17.0)
Immature Granulocytes: 5 %
Lymphocytes Relative: 40 %
Lymphs Abs: 0.2 K/uL — ABNORMAL LOW (ref 0.7–4.0)
MCH: 29.2 pg (ref 26.0–34.0)
MCHC: 32.3 g/dL (ref 30.0–36.0)
MCV: 90.5 fL (ref 80.0–100.0)
Monocytes Absolute: 0.1 K/uL (ref 0.1–1.0)
Monocytes Relative: 35 %
Neutro Abs: 0 K/uL — CL (ref 1.7–7.7)
Neutrophils Relative %: 6 %
Platelets: 74 K/uL — ABNORMAL LOW (ref 150–400)
RBC: 3.15 MIL/uL — ABNORMAL LOW (ref 4.22–5.81)
RDW: 15.2 % (ref 11.5–15.5)
WBC: 0.4 K/uL — CL (ref 4.0–10.5)
nRBC: 0 % (ref 0.0–0.2)

## 2024-07-02 LAB — ECHOCARDIOGRAM COMPLETE
AR max vel: 2.24 cm2
AV Peak grad: 16.2 mmHg
Ao pk vel: 2.01 m/s
Area-P 1/2: 3.46 cm2
Calc EF: 52.3 %
Height: 72 in
MV M vel: 5.17 m/s
MV Peak grad: 106.9 mmHg
P 1/2 time: 277 ms
Radius: 0.6 cm
S' Lateral: 4 cm
Single Plane A2C EF: 49.5 %
Single Plane A4C EF: 55.2 %
Weight: 3019.42 [oz_av]

## 2024-07-02 LAB — COMPREHENSIVE METABOLIC PANEL WITH GFR
ALT: 38 U/L (ref 0–44)
AST: 35 U/L (ref 15–41)
Albumin: 2.1 g/dL — ABNORMAL LOW (ref 3.5–5.0)
Alkaline Phosphatase: 86 U/L (ref 38–126)
Anion gap: 10 (ref 5–15)
BUN: 20 mg/dL (ref 8–23)
CO2: 30 mmol/L (ref 22–32)
Calcium: 7.8 mg/dL — ABNORMAL LOW (ref 8.9–10.3)
Chloride: 99 mmol/L (ref 98–111)
Creatinine, Ser: 1.3 mg/dL — ABNORMAL HIGH (ref 0.61–1.24)
GFR, Estimated: 55 mL/min — ABNORMAL LOW (ref >60)
Glucose, Bld: 98 mg/dL (ref 70–99)
Potassium: 2.9 mmol/L — ABNORMAL LOW (ref 3.5–5.1)
Sodium: 139 mmol/L (ref 135–145)
Total Bilirubin: 1 mg/dL (ref 0.0–1.2)
Total Protein: 5.6 g/dL — ABNORMAL LOW (ref 6.5–8.1)

## 2024-07-02 LAB — GLUCOSE, CAPILLARY
Glucose-Capillary: 95 mg/dL (ref 70–99)
Glucose-Capillary: 96 mg/dL (ref 70–99)
Glucose-Capillary: 100 mg/dL — ABNORMAL HIGH (ref 70–99)
Glucose-Capillary: 81 mg/dL (ref 70–99)
Glucose-Capillary: 91 mg/dL (ref 70–99)

## 2024-07-02 LAB — HEMOGLOBIN AND HEMATOCRIT, BLOOD
HCT: 29.1 % — ABNORMAL LOW (ref 39.0–52.0)
Hemoglobin: 9.3 g/dL — ABNORMAL LOW (ref 13.0–17.0)

## 2024-07-02 LAB — PHOSPHORUS: Phosphorus: 2.3 mg/dL — ABNORMAL LOW (ref 2.5–4.6)

## 2024-07-02 LAB — BRAIN NATRIURETIC PEPTIDE: B Natriuretic Peptide: 650.5 pg/mL — ABNORMAL HIGH (ref 0.0–100.0)

## 2024-07-02 LAB — MAGNESIUM: Magnesium: 2 mg/dL (ref 1.7–2.4)

## 2024-07-02 MED ORDER — LEVALBUTEROL HCL 0.63 MG/3ML IN NEBU
0.6300 mg | INHALATION_SOLUTION | Freq: Two times a day (BID) | RESPIRATORY_TRACT | Status: DC
  Administered 2024-07-02 – 2024-07-03 (×3): 0.63 mg via RESPIRATORY_TRACT
  Filled 2024-07-02 (×3): qty 3

## 2024-07-02 MED ORDER — LOPERAMIDE HCL 2 MG PO CAPS
4.0000 mg | ORAL_CAPSULE | Freq: Once | ORAL | Status: AC
  Administered 2024-07-02: 4 mg via ORAL
  Filled 2024-07-02: qty 2

## 2024-07-02 MED ORDER — FUROSEMIDE 10 MG/ML IJ SOLN
20.0000 mg | Freq: Two times a day (BID) | INTRAMUSCULAR | Status: DC
  Administered 2024-07-02 (×2): 20 mg via INTRAVENOUS
  Filled 2024-07-02 (×2): qty 2

## 2024-07-02 MED ORDER — IPRATROPIUM BROMIDE 0.02 % IN SOLN
0.5000 mg | Freq: Two times a day (BID) | RESPIRATORY_TRACT | Status: DC
  Administered 2024-07-02 – 2024-07-03 (×3): 0.5 mg via RESPIRATORY_TRACT
  Filled 2024-07-02 (×3): qty 2.5

## 2024-07-02 MED ORDER — PERFLUTREN LIPID MICROSPHERE
1.0000 mL | INTRAVENOUS | Status: AC | PRN
  Administered 2024-07-02: 2 mL via INTRAVENOUS

## 2024-07-02 MED ORDER — POTASSIUM CHLORIDE CRYS ER 20 MEQ PO TBCR
40.0000 meq | EXTENDED_RELEASE_TABLET | Freq: Once | ORAL | Status: AC
  Administered 2024-07-02: 40 meq via ORAL
  Filled 2024-07-02: qty 2

## 2024-07-02 MED ORDER — LOPERAMIDE HCL 2 MG PO CAPS
2.0000 mg | ORAL_CAPSULE | ORAL | Status: DC | PRN
  Administered 2024-07-03: 2 mg via ORAL
  Filled 2024-07-02: qty 1

## 2024-07-02 MED ORDER — DIPHENHYDRAMINE-ZINC ACETATE 2-0.1 % EX CREA
TOPICAL_CREAM | Freq: Three times a day (TID) | CUTANEOUS | Status: DC | PRN
  Filled 2024-07-02: qty 28

## 2024-07-02 MED ORDER — K PHOS MONO-SOD PHOS DI & MONO 155-852-130 MG PO TABS
250.0000 mg | ORAL_TABLET | Freq: Two times a day (BID) | ORAL | Status: DC
  Administered 2024-07-02 – 2024-07-03 (×3): 250 mg via ORAL
  Filled 2024-07-02 (×3): qty 1

## 2024-07-02 MED ORDER — POTASSIUM CHLORIDE CRYS ER 20 MEQ PO TBCR
40.0000 meq | EXTENDED_RELEASE_TABLET | ORAL | Status: AC
  Administered 2024-07-02 (×2): 40 meq via ORAL
  Filled 2024-07-02 (×2): qty 2

## 2024-07-02 NOTE — Progress Notes (Signed)
 ID PROGRESS NOTE  No new micro data available in terms of sensitivities:  Blood cx 8/16: ngtd 8/15 sputum cx NGTD 8/14 blood cx enterococcus faecalis 8/14 blood cx GNR and GPC 8/14 urine cx enterobacter   Patient has decided to pursue hospice care to be receiving care at home. Will plan to transition to oral abtx upon discharge. For now, continue on iv abtx.  This plan has been discussed with drs. mims and sebastian Channel B. Luiz MD MPH [facility] [phone]

## 2024-07-02 NOTE — Consult Note (Signed)
 Consultation  Referring Provider:     Dr. Sebastian Primary Care Physician:  [name], MD Primary Gastroenterologist:       Dr. San  Reason for Consultation:   Positive FOBT , anemia         HPI:   Joshua Compton. is a 83 y.o. male with metastatic adenocarcinoma involving the bones, muscle, unknown primary, coronary artery disease with recent stent placement in July of this year, on dual antiplatelet, atrial fibrillation on Eliquis , COPD on home O2, history of stroke, who was admitted to the hospital from the oncology office for neutropenic fever.  Subsequent blood cultures positive for Enterococcus faecalis, E. coli and Klebsiella.  Urine cultures positive for Enterobacter. GI consulted due to concern for possible GI bleed due to decline in hemoglobin and positive FOBT.  His hemoglobin was 12.1 July 29, 10.4 on August 5, 8.4 on August 14 and 7.1 on August 15.  BUN not elevated. He received 2 units PRBCs with appropriate rise to 9.2. He is severely neutropenic and thrombocytopenic (WBC 0.4, platelets 74). A fecal occult blood test was positive. Liver enzymes with mild elevation of aminotransferases on admission, but no evidence of biliary obstruction.  Aminotransferases now normal.  He has reportedly been having hemoptysis, but no obvious GI bleeding  Oncology is following, and is concerned that chemotherapy may be causing more harm than good.  Palliative care has been consulted.  He was admitted to the hospital July 24 with chest pain and elevated troponins.  He was transferred to [facility] and underwent cardiac catheterization on July 28 with severe two-vessel disease, with drug-eluting stent placed in the left circumflex.  An LAD lesion was not treated.     Past Medical History:  Diagnosis Date   Acute heart failure with preserved ejection fraction (HFpEF) (HCC) 09/05/2023   Acute HFrEF (heart failure with reduced ejection fraction) (HCC) 09/08/2023   Acute  respiratory failure with hypoxia (HCC) 09/05/2023   Allergy    Arthritis    Cancer (HCC)    prostate ca   seed implants   CHF (congestive heart failure) (HCC)    GERD (gastroesophageal reflux disease)    Glaucoma    Hyperlipidemia    Hypertension    dr cyrena     [place]   fm   Stroke [facility]) 07/2023    Past Surgical History:  Procedure Laterality Date   BACK SURGERY     x2   COLONOSCOPY     CORONARY IMAGING/OCT N/A 06/13/2024   Procedure: CORONARY IMAGING/OCT;  Surgeon: Anner Alm ORN, MD;  Location: [facility] INVASIVE CV LAB;  Service: Cardiovascular;  Laterality: N/A;   CORONARY LITHOTRIPSY N/A 06/13/2024   Procedure: CORONARY LITHOTRIPSY;  Surgeon: Anner Alm ORN, MD;  Location: [facility] INVASIVE CV LAB;  Service: Cardiovascular;  Laterality: N/A;   CORONARY STENT INTERVENTION N/A 06/13/2024   Procedure: CORONARY STENT INTERVENTION;  Surgeon: Anner Alm ORN, MD;  Location: [facility] INVASIVE CV LAB;  Service: Cardiovascular;  Laterality: N/A;   EYE SURGERY     cataract   HERNIA REPAIR  11/06/2009   left inguinal repair    IR IMAGING GUIDED PORT INSERTION  06/20/2024   LEFT HEART CATH AND CORONARY ANGIOGRAPHY N/A 06/13/2024   Procedure: LEFT HEART CATH AND CORONARY ANGIOGRAPHY;  Surgeon: Anner Alm ORN, MD;  Location: [facility] INVASIVE CV LAB;  Service: Cardiovascular;  Laterality: N/A;   NECK SURGERY     ROTATOR CUFF REPAIR Left 11/2014   TOTAL HIP ARTHROPLASTY Right  04/20/2020   Procedure: RIGHT TOTAL HIP ARTHROPLASTY ANTERIOR APPROACH;  Surgeon: Jerri Kay HERO, MD;  Location: [facility] OR;  Service: Orthopedics;  Laterality: Right;   TOTAL HIP ARTHROPLASTY Left 01/07/2021   Procedure: LEFT TOTAL HIP ARTHROPLASTY ANTERIOR APPROACH;  Surgeon: Jerri Kay HERO, MD;  Location: [facility] OR;  Service: Orthopedics;  Laterality: Left;   TRANSURETHRAL RESECTION OF PROSTATE      Family History  Problem Relation Age of Onset   Cancer Mother        breast   Heart disease Father    Stroke Sister    Hypertension Sister     Healthy Sister    Heart attack Sister    Other Sister        unknown cause   Cancer Sister    Other Brother        multiple back surgeries   Hypertension Brother    Throat cancer Brother    Cancer Brother        groin related cancer   Heart attack Brother    Atrial fibrillation Daughter    Insurance account manager Parkinson White syndrome Daughter    Cancer Son        luekimia   Heart disease Paternal Uncle    Other Brother        suicide-   Colon cancer Neg Hx    Rectal cancer Neg Hx    Stomach cancer Neg Hx     Social History   Tobacco Use   Smoking status: Former    Current packs/day: 0.00    Average packs/day: 0.5 packs/day for 30.0 years (15.0 ttl pk-yrs)    Types: Cigarettes    Start date: 03/29/1975    Quit date: 03/28/2005    Years since quitting: 19.2   Smokeless tobacco: Never  Vaping Use   Vaping status: Never Used  Substance Use Topics   Alcohol use: No   Drug use: Never    Prior to Admission medications   Medication Sig Start Date End Date Taking? Authorizing Provider  amiodarone  (PACERONE ) 200 MG tablet Take 1 tablet (200 mg total) by mouth daily. 02/25/24  Yes Branch, Dorn FALCON, MD  apixaban  (ELIQUIS ) 5 MG TABS tablet Take 1 tablet (5 mg total) by mouth 2 (two) times daily. 06/14/24  Yes Arrien, Elidia Sieving, MD  atorvastatin  (LIPITOR) 40 MG tablet Take 1 tablet (40 mg total) by mouth daily. 02/18/24  Yes Stacks, Butler, MD  cholecalciferol  (VITAMIN D3) 25 MCG (1000 UNIT) tablet TAKE 1 TABLET BY MOUTH EVERY DAY 02/16/24  Yes Zollie Butler, MD  clopidogrel  (PLAVIX ) 75 MG tablet Take 1 tablet (75 mg total) by mouth daily with breakfast. 06/14/24 07/14/24 Yes Arrien, Elidia Sieving, MD  dorzolamide -timolol  (COSOPT ) 22.3-6.8 MG/ML ophthalmic solution Place 1 drop into both eyes in the morning. 08/04/16  Yes [provider]  Fluticasone -Umeclidin-Vilant (TRELEGY ELLIPTA ) 100-62.5-25 MCG/ACT AEPB Inhale 1 puff into the lungs daily. 01/22/24  Yes Jude Harden GAILS, MD   lidocaine -prilocaine  (EMLA ) cream Apply to affected area once Patient taking differently: Apply 1 Application topically as needed (for port access every 3 weeks). 06/16/24  Yes Federico Norleen [name], MD  metoprolol  succinate (TOPROL -XL) 25 MG 24 hr tablet TAKE 0.5 TABLETS (12.5 MG TOTAL) BY MOUTH DAILY. FOR BLOOD PRESSURE CONTROL 01/07/24  Yes Branch, Dorn FALCON, MD  ondansetron  (ZOFRAN ) 8 MG tablet Take 1 tablet (8 mg total) by mouth every 8 (eight) hours as needed for nausea or vomiting. Start on the third day after chemotherapy.  06/16/24  Yes Federico Norleen [name], MD  oxyCODONE  ER (XTAMPZA  ER) 9 MG C12A Take 9 mg by mouth every 12 (twelve) hours as needed. Patient taking differently: Take 9 mg by mouth every 12 (twelve) hours as needed (for pain). 05/26/24  Yes Thayil, Irene T, PA-C  Oxycodone  HCl 10 MG TABS Take 1 tablet (10 mg total) by mouth every 6 (six) hours as needed for up to 120 doses. Patient taking differently: Take 10 mg by mouth every 6 (six) hours as needed (for pain). 05/02/24  Yes Thayil, Irene T, PA-C  pantoprazole  (PROTONIX ) 40 MG tablet Take 1 tablet (40 mg total) by mouth daily. For heartburn Patient taking differently: Take 40 mg by mouth daily before breakfast. For heartburn 07/28/23  Yes Stacks, Butler, MD  prochlorperazine  (COMPAZINE ) 10 MG tablet Take 1 tablet (10 mg total) by mouth every 6 (six) hours as needed for nausea or vomiting. 06/16/24  Yes Federico Norleen [name], MD  rOPINIRole  (REQUIP ) 1 MG tablet Take 1 tablet (1 mg total) by mouth 3 (three) times daily. Plus an extra tablet at bedtime to total four a day Patient taking differently: Take 1 mg by mouth See admin instructions. Take 1 mg by mouth three times a day and an additional 1 mg at bedtime as needed/as directed 11/30/23  Yes Stacks, Butler, MD  sacubitril -valsartan  (ENTRESTO ) 24-26 MG Take 1 tablet by mouth 2 (two) times daily. 04/05/24  Yes Zollie Butler, MD  torsemide  (DEMADEX ) 20 MG tablet Take 1 tablet (20 mg total) by  mouth daily. 06/30/24  Yes Alvan Dorn FALCON, MD    Current Facility-Administered Medications  Medication Dose Route Frequency Provider Last Rate Last Admin   0.9 %  sodium chloride  infusion (Manually program via Guardrails IV Fluids)   Intravenous Once Thompson, Daniel V, MD   Held at 07/01/24 1823   acetaminophen  (TYLENOL ) tablet 650 mg  650 mg Oral Q6H PRN Sebastian Toribio GAILS, MD   650 mg at 06/30/24 2300   amiodarone  (PACERONE ) tablet 200 mg  200 mg Oral Daily Sebastian Toribio GAILS, MD   200 mg at 07/02/24 9082   ampicillin  (OMNIPEN) 2 g in sodium chloride  0.9 % 100 mL IVPB  2 g Intravenous Q4H Ellington, Abby K, RPH   Stopped at 07/02/24 0950   atorvastatin  (LIPITOR) tablet 40 mg  40 mg Oral Daily Sebastian Toribio GAILS, MD   40 mg at 07/02/24 9081   budesonide  (PULMICORT ) nebulizer solution 0.5 mg  0.5 mg Nebulization BID Sebastian Toribio GAILS, MD   0.5 mg at 07/02/24 9257   ceFEPIme  (MAXIPIME ) 2 g in sodium chloride  0.9 % 100 mL IVPB  2 g Intravenous Q12H [name], RPH   Stopped at 07/02/24 0900   Chlorhexidine  Gluconate Cloth 2 % PADS 6 each  6 each Topical Daily Sebastian Toribio GAILS, MD   6 each at 07/01/24 1330   cholecalciferol  (VITAMIN D3) 25 MCG (1000 UNIT) tablet 1,000 Units  1,000 Units Oral Daily Sebastian Toribio GAILS, MD   1,000 Units at 07/02/24 9082   clopidogrel  (PLAVIX ) tablet 75 mg  75 mg Oral Q breakfast Sebastian Toribio GAILS, MD   75 mg at 07/02/24 0825   diphenhydrAMINE -zinc  acetate (BENADRYL ) 2-0.1 % cream   Topical TID PRN Chavez, Abigail, NP   Given at 07/02/24 0019   dorzolamide -timolol  (COSOPT ) 2-0.5 % ophthalmic solution 1 drop  1 drop Both Eyes Daily Sebastian Toribio GAILS, MD   1 drop at 07/02/24 9065   filgrastim -aafi (  NIVESTYM ) injection 480 mcg  480 mcg Subcutaneous q1800 Sebastian Toribio GAILS, MD   480 mcg at 07/01/24 2104   furosemide  (LASIX ) injection 20 mg  20 mg Intravenous BID Sebastian Toribio GAILS, MD       guaiFENesin  (MUCINEX ) 12 hr tablet 1,200 mg  1,200 mg Oral BID Sebastian Toribio GAILS, MD   1,200 mg at 07/02/24 9082   hydrALAZINE  (APRESOLINE ) injection 10 mg  10 mg Intravenous Q6H PRN Sebastian Toribio GAILS, MD       HYDROmorphone  (DILAUDID ) injection 1 mg  1 mg Intravenous Q4H PRN Sebastian Toribio GAILS, MD       insulin  aspart (novoLOG ) injection 0-9 Units  0-9 Units Subcutaneous TID WC Thompson, Daniel V, MD       ipratropium (ATROVENT ) nebulizer solution 0.5 mg  0.5 mg Nebulization BID Sebastian Toribio GAILS, MD       levalbuterol  (XOPENEX ) nebulizer solution 0.63 mg  0.63 mg Nebulization Q2H PRN Sebastian Toribio GAILS, MD   0.63 mg at 06/30/24 2258   levalbuterol  (XOPENEX ) nebulizer solution 0.63 mg  0.63 mg Nebulization BID Sebastian Toribio GAILS, MD       methocarbamol  (ROBAXIN ) injection 500 mg  500 mg Intravenous Q6H PRN Sebastian Toribio GAILS, MD   500 mg at 06/30/24 2300   metoprolol  succinate (TOPROL -XL) 24 hr tablet 12.5 mg  12.5 mg Oral Daily Sebastian Toribio GAILS, MD   12.5 mg at 07/02/24 9081   ondansetron  (ZOFRAN ) tablet 4 mg  4 mg Oral Q6H PRN Sebastian Toribio GAILS, MD       Or   ondansetron  (ZOFRAN ) injection 4 mg  4 mg Intravenous Q6H PRN Sebastian Toribio GAILS, MD       oxyCODONE  (Oxy IR/ROXICODONE ) immediate release tablet 10 mg  10 mg Oral Q6H PRN Sebastian Toribio GAILS, MD   10 mg at 07/01/24 1721   oxyCODONE  (OXYCONTIN ) 12 hr tablet 10 mg  10 mg Oral Q12H Sebastian Toribio GAILS, MD   10 mg at 07/02/24 9082   pantoprazole  (PROTONIX ) injection 40 mg  40 mg Intravenous Q12H Sebastian Toribio GAILS, MD   40 mg at 07/02/24 9080   perflutren  lipid microspheres (DEFINITY ) IV suspension  1-10 mL Intravenous PRN Manandhar, Sabina, MD   2 mL at 07/02/24 0900   phenol (CHLORASEPTIC) mouth spray 1 spray  1 spray Mouth/Throat PRN Sebastian Toribio GAILS, MD   1 spray at 07/01/24 0306   phosphorus (K PHOS  NEUTRAL) tablet 250 mg  250 mg Oral BID Sebastian Toribio GAILS, MD   250 mg at 07/02/24 9082   potassium chloride  SA (KLOR-CON  M) CR tablet 40 mEq  40 mEq Oral Q4H Sebastian Toribio GAILS, MD   40 mEq at 07/02/24 0825    rOPINIRole  (REQUIP ) tablet 1 mg  1 mg Oral TID Sebastian Toribio GAILS, MD   1 mg at 07/02/24 9081   sacubitril -valsartan  (ENTRESTO ) 24-26 mg per tablet  1 tablet Oral BID Sebastian Toribio GAILS, MD   1 tablet at 07/02/24 9082   sodium chloride  flush (NS) 0.9 % injection 10-40 mL  10-40 mL Intracatheter Q12H Sebastian Toribio GAILS, MD   10 mL at 07/02/24 0930   sorbitol  70 % solution 30 mL  30 mL Oral Daily PRN Sebastian Toribio GAILS, MD        Allergies as of 06/30/2024 - Review Complete 06/30/2024  Allergen Reaction Noted   Iodinated contrast media Anaphylaxis, Swelling, and Other (See Comments) 08/07/2023   Pravastatin  Other (See Comments) 12/08/2013  Physical Exam:  Vital signs in last 24 hours: Temp:  [98 F (36.7 C)-100 F (37.8 C)] 99 F (37.2 C) (08/16 0800) Pulse Rate:  [68-81] 78 (08/16 0918) Resp:  [13-28] 16 (08/16 0900) BP: (100-147)/(30-50) 126/43 (08/16 0918) SpO2:  [79 %-100 %] 94 % (08/16 0900) Last BM Date : 07/01/24   LAB RESULTS: Recent Labs    06/30/24 1424 07/01/24 0441 07/01/24 2310 07/02/24 0440  WBC 0.2* 0.2*  --  0.4*  HGB 8.4* 7.1* 9.3* 9.2*  HCT 24.8* 22.3* 29.1* 28.5*  PLT 67* 57*  --  74*   BMET Recent Labs    06/30/24 1424 07/01/24 0441 07/02/24 0440  NA 140 137 139  K 2.9* 2.8* 2.9*  CL 100 99 99  CO2 33* 28 30  GLUCOSE 109* 113* 98  BUN 16 19 20   CREATININE 1.04 1.21 1.30*  CALCIUM  8.5* 8.1* 7.8*   LFT Recent Labs    07/02/24 0440  PROT 5.6*  ALBUMIN  2.1*  AST 35  ALT 38  ALKPHOS 86  BILITOT 1.0   PT/INR No results for input(s): LABPROT, INR in the last 72 hours.  STUDIES: DG CHEST PORT 1 VIEW Result Date: 07/02/2024 CLINICAL DATA:  Hypoxia, neutropenic fever EXAM: PORTABLE CHEST 1 VIEW COMPARISON:  07/01/2024 FINDINGS: Port in the anterior chest wall with tip in distal SVC. Normal cardiac silhouette. Bibasilar airspace disease. Airspace disease in the RIGHT lower lobe is more dense than the LEFT. No interval change.  IMPRESSION: Bibasilar airspace disease, RIGHT greater than LEFT. No interval change. Electronically Signed   By: Jackquline Boxer M.D.   On: 07/02/2024 10:16   DG CHEST PORT 1 VIEW Result Date: 07/01/2024 CLINICAL DATA:  Hypoxia EXAM: PORTABLE CHEST 1 VIEW COMPARISON:  Yesterday FINDINGS: Right Port-A-Cath tip at low SVC or superior caval/atrial junction. Tracheal deviation left, consistent with known right-sided thyroid  mass. Borderline cardiomegaly. Atherosclerosis in the transverse aorta. No pleural effusion or pneumothorax. Underlying interstitial thickening is likely related to smoking/chronic bronchitis. More confluent right greater than left interstitial and airspace opacities again identified. Progressive, especially on the right. IMPRESSION: Progressive right greater than left interstitial and airspace opacities, suspicious for pneumonia superimposed upon sequelae of smoking/chronic bronchitis. Aortic Atherosclerosis (ICD10-I70.0). Electronically Signed   By: Rockey Kilts M.D.   On: 07/01/2024 09:11   DG Chest 2 View Result Date: 06/30/2024 EXAM: 2 VIEW(S) XRAY OF THE CHEST 06/30/2024 01:41:00 PM COMPARISON: 06/09/2024 CLINICAL HISTORY: SOB \\T \ fever- pt also states he is having slight mid chest pain, coughing up phlegm - Adenocarcinoma of unknown primary ; Hx of PAF, CAD, recent NSTEMI, COPD FINDINGS: LINES AND TUBES: Right chest wall port tip terminates over the SVC. LUNGS AND PLEURA: Bibasilar patchy opacities. No pleural effusion. No pneumothorax. HEART AND MEDIASTINUM: Leftward deviation of the trachea, in keeping with known right thyroid  mass. Unchanged cardiomediastinal silhouette. BONES AND SOFT TISSUES: No acute osseous abnormality. IMPRESSION: 1. Bibasilar patchy opacities, which may represent atelectasis, aspiration, or pneumonia. 2. Leftward deviation of the trachea, in keeping with known right thyroid  mass. 3. Unchanged cardiomediastinal silhouette. Electronically signed by: Manford Cummins MD  06/30/2024 01:50 PM EDT RP Workstation: [id number]     PREVIOUS ENDOSCOPIES:            Colonoscopy May 23, 2024 5 mm polyp in cecum 2 small rectum/sigmoid polyps Internal hemorrhoids Otherwise normal  EGD May 23, 2024 5 mm polyp in gastric body Localized inflammation in the antrum  FINAL DIAGNOSIS  1. Surgical [P], gastric polyp, polyp (1) :      - HYPERPLASTIC POLYP.      - NO DYSPLASIA OR MALIGNANCY.       2. Surgical [P], gastric, polyp (1) :      - POLYPOID FRAGMENT OF GASTRIC MUCOSA WITH REACTIVE GASTROPATHY      - POSITIVE FOR INTESTINAL METAPLASIA      - INDEFINITE FOR DYSPLASIA, FAVOR REACTIVE CHANGE.  SEE NOTE.       3. Surgical [P], colon, cecum, polyp (1) :      - TUBULAR ADENOMA.      - NO HIGH GRADE DYSPLASIA OR MALIGNANCY.       4. Surgical [P], colon, sigmoid, rectal, polyp (2) :      - TUBULAR ADENOMA.      - NO HIGH GRADE DYSPLASIA OR MALIGNANCY.      - HYPERPLASTIC POLYP.      - NO DYSPLASIA OR MALIGNANCY.    Impression / Plan:   83 year old male with metastatic adenocarcinoma of unknown primary, with underlying coronary artery disease with recent stent placement, atrial fibrillation, COPD on home O2 and history of stroke admitted with neutropenic fever.  He has polymicrobial bacteremia, with positive urine culture and x-ray suggestive of pneumonia.  He is having hemoptysis.  He has pancytopenia and progressive drops in his hemoglobin with positive fecal occult blood, but no overt GI bleeding. GI was consulted for management of suspected GI bleeding.    However, since then palliative care has met with the patient and his family and they have elected to proceed with hospice care.  Agree with this decision.  There would be no role for an endoscopic evaluation at this time given the patient's severe neutropenia and tenuous health status.  When I went to see the patient, there were many family members in the room grieving and after discussion with Dr.  Corinne of palliative care, it was felt there was no reason for me to see or examine the patient.  No plans for endoscopic evaluation.  GI will sign off.    Thanks   LOS: 2 days   Glendia FORBES Holt  07/02/2024, 10:54 AM

## 2024-07-02 NOTE — Progress Notes (Signed)
 Echocardiogram 2D Echocardiogram has been performed.  Taevon Aschoff N Varsha Knock,RDCS 07/02/2024, 9:47 AM

## 2024-07-02 NOTE — Progress Notes (Signed)
 PROGRESS NOTE    Joshua Compton.  FMW:[id number] DOB: 07-Jan-1941 DOA: 06/30/2024 PCP: [name], MD    No chief complaint on file.   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  Patient admitted, pancultured.  Patient noted with a bacteremia and concern for pneumonia.  Patient on empiric IV antibiotics.  ID consulted.  Oncology consulted.  Palliative consulted   Assessment & Plan:   Principal Problem:   Neutropenic fever (HCC) Active Problems:   Bacteremia   NSTEMI (non-ST elevated myocardial infarction) (HCC)   PAF (paroxysmal atrial fibrillation) (HCC)   Chronic kidney disease, stage 3a (HCC)   Chronic diastolic CHF (congestive heart failure) (HCC)   Centrilobular emphysema (HCC)   Adenocarcinoma of unknown primary (HCC)   History of CVA (cerebrovascular accident)   Gastroesophageal reflux disease with esophagitis without hemorrhage   Type 2 diabetes mellitus with hyperlipidemia (HCC)   Anemia   Cerebrovascular accident (CVA) due to embolism of left middle cerebral artery (HCC)   Venous insufficiency of both lower extremities   Cancer, metastatic to bone (HCC)   Acute on chronic combined systolic and diastolic CHF (congestive heart failure) (HCC)   Pancytopenia (HCC)   Pressure injury of skin   CAP (community acquired pneumonia)   Hemoptysis   Diarrhea   Palliative care encounter   Goals of care, counseling/discussion   DNR (do not resuscitate)   Counseling and  coordination of care  #1 neutropenic fever/?CAP/bacteremia -Patient presenting with drowsiness, fevers x 3 days, cough, shortness of breath and noted with a severe neutropenia with white count of 0.2 and ANC of 0.0. -Labs today with a WBC of 0.4 and ANC of 0.0. - Chest x-ray done with bibasilar patchy opacities which may represent atelectasis or pneumonia. - Patient with some lower extremity wounds with some redness and concern for possible cellulitis. - Urinalysis nitrite negative leukocyte negative. - Blood cultures obtained with 2/2 bottles with BCID growing Enterococcus faecalis, E. coli, Klebsiella aerogenes - Chest x-ray, 07/01/2024, with progressive right greater than left interstitial and airspace opacities suspicious for pneumonia superimposed upon sequela of smoking/chronic bronchitis.  -Procalcitonin noted at 1.11.   - Urine cultures pending.  - Urine strep pneumococcus antigen negative.  - Urine Legionella antigen pending.  - Stool studies pending.  - Respiratory viral panel negative. - SARS coronavirus 2 by PCR negative, influenza A and B by PCR negative, RSV by PCR negative. - Patient seen in consultation by oncology who stated patient had G-CSF after last chemotherapy treatment recently and did not need any further G-CSF.   - Discontinue G-CSF per oncology recommendations per -Discontinued vancomycin . -Continue IV cefepime  and IV ampicillin . - ID following. -Supportive care.  2.  Bacteremia secondary to E. Coli, Enterococcus faecalis, Klebsiella aerogenes.  -??  Etiology. -Urine cultures pending. -Patient was on IV vancomycin  and IV cefepime  on admission. -Discontinued IV vancomycin . -Continue IV cefepime  and IV ampicillin  per - 2D echo ordered per ID. - ID following and appreciate their input and recommendations..  3.  Hemoptysis versus GI bleed -??  Etiology -Chest  x-ray concerning for community-acquired pneumonia in the immunocompromised patient presented with  neutropenic fevers in the setting of thrombocytopenia. -Patient noted to be on dual antiplatelet therapy of Eliquis  and Plavix  prior to admission secondary to recent non-STEMI status post DES. -Spoke with patient's primary cardiologist on admission and recommended holding Eliquis  with continuation of Plavix  due to recent stent placement. - Hemoglobin noted at 7.1 on 07/01/2024, status post transfusion 2 units PRBCs with hemoglobin currently at 9.2. -FOBT positive -Continue empiric IV antibiotics. -Consult with GI for further evaluation and management -May need to CT chest if worsening or continued hemoptysis.   4.  Pancytopenia -Secondary to recent chemotherapy. - Patient being pancultured secondary to problem #1. - Patient noted to have received G-CSF per family on Monday, 06/27/2024. - Patient noted to have started chemotherapy on 06/24/2024 and per daughters has completed 9 radiation treatments. - Patient with a bacteremia with blood cultures growing Enterococcus faecalis, E. coli, Klebsiella erogenous.  -Hemoglobin noted at 7.1(07/01/2024). -Anemia panel with iron level of 16, TIBC of 139, ferritin of 464, folate of 11.3, vitamin B12 of 343. - Status post transfusion 2 units PRBCs with hemoglobin currently at 9.2 this morning.  -Platelet count noted at 57K on 07/01/2024,, patient with hemoptysis and as such patient transfuse 1 unit platelets with platelet count currently at 74K.  -FOBT positive - Follow H&H and transfuse for hemoglobin < 8. -Due to anemia, positive FOBT, hemoptysis versus??  GI will consult with gastroenterology for further evaluation and management. - Patient's primary oncologist is aware of patient's admission and will be following patient during the hospitalization. - Supportive care.  5.  Anemia -Patient presentation with a pancytopenia felt likely secondary to recent chemotherapy. -Hemoglobin noted to drop as low as 7.1 (07/01/2024) and patient transfused 2 units PRBCs  with hemoglobin currently at 9.2 this morning. -Anemia panel with iron level of 16, TIBC of 139, ferritin of 464, folate of 11.3, vitamin B12 of 343. -FOBT noted to be positive. -Patient with hemoptysis versus GI bleed -Continue IV PPI every 12 hours. -Change diet to clear liquids until assessed by GI. -Consult with GI for further evaluation and management.   6.  Metastatic adenocarcinoma involving the bones and muscle, primary unknown -Patient noted to have recently started chemotherapy on 06/24/2024. - Per daughters at bedside patient has already completed 9 radiation treatments. -Per daughter his next chemotherapy treatment is on 07/15/2024. -Patient now with hemoptysis, bacteremia with 3 separate organisms, community-acquired pneumonia, neutropenic fevers, pancytopenia with worsening platelets count. -Patient with poor prognosis. -Oncology informed of admission and have assessed and following.   - Palliative care consulted and following.   - Per oncology.    7.  Hypokalemia -Magnesium  noted at 2.0. - Potassium at 2.9. - K-Dur 40 mEq p.o. every 4 hours x 2 doses. - Repeat labs in the AM.   8.  Paroxysmal atrial fibrillation - Currently in A-fib this morning but rate controlled.   -Continue home regimen amiodarone  and Toprol -XL for rate control. - Patient with a thrombocytopenia with platelet count of 74 K after transfusion of 1 unit platelets.. -Patient with hemoptysis. -Continue to hold Eliquis . - Discussed with patient's primary cardiologist.   9.  CAD/recent non-STEMI with PCI -Patient noted to have been on triple therapy x 1 week, aspirin  discontinued and patient recommended to continue on Eliquis  and Plavix . - Due to thrombocytopenia Eliquis  held and will continue to hold as patient now with hemoptysis.   - Continue Plavix  and monitor platelet count.   -  Discussed with patient's primary cardiologist.    10.  COPD/chronic respiratory failure on 2 L nasal cannula -Stable. -  Discontinued Breztri . - Continue Pulmicort  twice daily, scheduled Xopenex  and Atrovent  nebs.    11.  Acute on chronic combined systolic and diastolic heart failure/chronic HFimpEF - Patient noted to have been started back on torsemide  20 mg daily on day of admission per his cardiologist note as patient was seen by cardiologist this morning. -BNP noted elevated at 650.5. -Patient status post transfusion 2 units PRBCs and 1 unit platelets. - Continue home regimen Toprol -XL, Entresto . -Discontinue torsemide . - Will place on Lasix  20 mg IV every 12 hours. -Strict I's and O's. -Daily weights.   12.  Bilateral lower extremity wounds/?  Cellulitis -Patient with worsening wounds on lower extremities with some erythema, some warmth and some tenderness to palpation. - Concern for possible cellulitis in the setting of chronic venous stasis changes. - Wound care RN consulted.   - On empiric IV antibiotics.     13.  GERD - Continue IV PPI.     14.  History of CVA -Felt likely secondary to paroxysmal atrial fibrillation. - Continue statin. - Eliquis  on hold due to thrombocytopenia and hemoptysis.  15.  Diarrhea -Patient with multiple watery loose stools. - Laxatives discontinued. - C. difficile PCR negative.   - GI pathogen panel pending.   16.  Pressure injury left buttock stage II/right buttock stage II, POA/lower extremity vascular ulcers.POA - WOC RN consulted and assessed patient and recommendations made..    DVT prophylaxis: SCDs Code Status: DNR Family Communication: Updated patient, 3 daughters at bedside and another daughter on speaker phone. Disposition: Transfer to stepdown unit  Status is: Inpatient Remains inpatient appropriate because: Severity of illness   Consultants:  ID: Dr.Manandhar 07/01/2024 Oncology: Dr. Federico 07/01/2024 GI pending  Procedures:  Chest x-ray 06/30/2024, 07/01/2024 Transfused 2 units PRBC 07/01/2024 Transfuse 1 unit platelet 07/01/2024 2D echo  pending  Antimicrobials:  Anti-infectives (From admission, onward)    Start     Dose/Rate Route Frequency Ordered Stop   07/01/24 2000  vancomycin  (VANCOREADY) IVPB 1500 mg/300 mL  Status:  Discontinued        1,500 mg 150 mL/hr over 120 Minutes Intravenous Every 24 hours 06/30/24 1920 07/01/24 1035   07/01/24 1600  ampicillin  (OMNIPEN) 2 g in sodium chloride  0.9 % 100 mL IVPB        2 g 300 mL/hr over 20 Minutes Intravenous Every 4 hours 07/01/24 1022     06/30/24 2000  vancomycin  (VANCOREADY) IVPB 1750 mg/350 mL        1,750 mg 175 mL/hr over 120 Minutes Intravenous  Once 06/30/24 1920 06/30/24 2344   06/30/24 1930  ceFEPIme  (MAXIPIME ) 2 g in sodium chloride  0.9 % 100 mL IVPB        2 g 200 mL/hr over 30 Minutes Intravenous Every 12 hours 06/30/24 1919           Subjective: Patient laying in bed.  Still with some hemoptysis noted however amount seems to have decreased over the past 24 hours.  Patient denies any chest pain.  Denies any significant shortness of breath.  On 4 L nasal cannula.  Daughters at bedside.   Objective: Vitals:   07/02/24 0744 07/02/24 0800 07/02/24 0900 07/02/24 0918  BP:  (!) 127/42 (!) 126/43 (!) 126/43  Pulse: 73 73 78 78  Resp: (!) 25 19 16    Temp:  99 F (37.2 C)  TempSrc:  Oral    SpO2: 94% 95% 94%   Weight:      Height:        Intake/Output Summary (Last 24 hours) at 07/02/2024 1016 Last data filed at 07/02/2024 0929 Gross per 24 hour  Intake 2838.86 ml  Output 2800 ml  Net 38.86 ml   Filed Weights   06/30/24 1807  Weight: 85.6 kg    Examination:  General exam: Appears calm and comfortable  Respiratory system: Coarse breath sounds noted on the right.  No wheezing.  Fair air movement.  Speaking in full sentences.  No use of accessory muscles of respiration. Cardiovascular system: Irregularly irregular.  No JVD, no murmurs rubs or gallops.  Trace bilateral lower extremity edema.  Gastrointestinal system: Abdomen is nondistended,  soft and nontender. No organomegaly or masses felt. Normal bowel sounds heard. Central nervous system: Alert and oriented.  Moving extremities spontaneously.  No focal neurological deficits. Extremities: Bilateral upper extremity swelling.  Trace bilateral lower extremity edema. Skin: No rashes, lesions or ulcers Psychiatry: Judgement and insight appear fair. Mood & affect appropriate.     Data Reviewed: I have personally reviewed following labs and imaging studies  CBC: Recent Labs  Lab 06/30/24 1424 07/01/24 0441 07/01/24 2310 07/02/24 0440  WBC 0.2* 0.2*  --  0.4*  NEUTROABS 0.0* 0.0*  --  0.0*  HGB 8.4* 7.1* 9.3* 9.2*  HCT 24.8* 22.3* 29.1* 28.5*  MCV 89.5 93.3  --  90.5  PLT 67* 57*  --  74*    Basic Metabolic Panel: Recent Labs  Lab 06/30/24 1424 07/01/24 0441 07/02/24 0440  NA 140 137 139  K 2.9* 2.8* 2.9*  CL 100 99 99  CO2 33* 28 30  GLUCOSE 109* 113* 98  BUN 16 19 20   CREATININE 1.04 1.21 1.30*  CALCIUM  8.5* 8.1* 7.8*  MG 1.8 2.3 2.0  PHOS  --  2.6 2.3*    GFR: Estimated Creatinine Clearance: 47.3 mL/min (A) (by C-G formula based on SCr of 1.3 mg/dL (H)).  Liver Function Tests: Recent Labs  Lab 06/30/24 1424 07/01/24 0441 07/02/24 0440  AST 45* 41 35  ALT 57* 49* 38  ALKPHOS 105 83 86  BILITOT 1.1 1.1 1.0  PROT 5.7* 5.3* 5.6*  ALBUMIN  3.0* 2.1* 2.1*    CBG: Recent Labs  Lab 06/30/24 1719 07/01/24 0830 07/01/24 1201 07/01/24 2309 07/02/24 0807  GLUCAP 106* 99 110* 81 95     Recent Results (from the past 240 hours)  Urine Culture     Status: Abnormal (Preliminary result)   Collection Time: 06/30/24  2:02 PM   Specimen: Urine, Clean Catch  Result Value Ref Range Status   Specimen Description   Final    URINE, CLEAN CATCH Performed at [facility], [address], [place], [place] [address]    Special Requests   Final    NONE Performed at [facility], [address],  [place], [place] [address]    Culture (A)  Final    >=100,000 COLONIES/mL GRAM NEGATIVE RODS SUSCEPTIBILITIES TO FOLLOW Performed at [facility], [address], [place], [place] [address]    Report Status PENDING  Incomplete  Culture, blood (Routine X 2) w Reflex to ID Panel     Status: None (Preliminary result)   Collection Time: 06/30/24  3:18 PM   Specimen: BLOOD  Result Value Ref Range Status   Specimen Description BLOOD LEFT ANTECUBITAL  Final  Special Requests   Final    BOTTLES DRAWN AEROBIC AND ANAEROBIC Blood Culture adequate volume   Culture  Setup Time   Final    GRAM NEGATIVE RODS GRAM POSITIVE COCCI IN PAIRS IN BOTH AEROBIC AND ANAEROBIC BOTTLES CRITICAL RESULT CALLED TO, READ BACK BY AND VERIFIED WITH: PHARMD C.DAVIS AT 942 ON 07/01/2024 BY T.SAAD.    Culture   Final    GRAM NEGATIVE RODS GRAM POSITIVE COCCI CULTURE REINCUBATED FOR BETTER GROWTH Performed at [facility], [address] [place], [place] [address]    Report Status PENDING  Incomplete  Blood Culture ID Panel (Reflexed)     Status: Abnormal   Collection Time: 06/30/24  3:18 PM  Result Value Ref Range Status   Enterococcus faecalis DETECTED (A) NOT DETECTED Final    Comment: CRITICAL RESULT CALLED TO, READ BACK BY AND VERIFIED WITH: PHARMD C.DAVIS AT 942 ON 07/01/2024 BY T.SAAD.    Enterococcus Faecium NOT DETECTED NOT DETECTED Final   Listeria monocytogenes NOT DETECTED NOT DETECTED Final   Staphylococcus species NOT DETECTED NOT DETECTED Final   Staphylococcus aureus (BCID) NOT DETECTED NOT DETECTED Final   Staphylococcus epidermidis NOT DETECTED NOT DETECTED Final   Staphylococcus lugdunensis NOT DETECTED NOT DETECTED Final   Streptococcus species NOT DETECTED NOT DETECTED Final   Streptococcus agalactiae NOT DETECTED NOT DETECTED Final   Streptococcus pneumoniae NOT DETECTED NOT DETECTED Final   Streptococcus pyogenes NOT DETECTED NOT DETECTED Final   A.calcoaceticus-baumannii NOT  DETECTED NOT DETECTED Final   Bacteroides fragilis NOT DETECTED NOT DETECTED Final   Enterobacterales DETECTED (A) NOT DETECTED Final    Comment: CRITICAL RESULT CALLED TO, READ BACK BY AND VERIFIED WITH: PHARMD C.DAVIS AT 942 ON 07/01/2024 BY T.SAAD.    Enterobacter cloacae complex NOT DETECTED NOT DETECTED Final   Escherichia coli DETECTED (A) NOT DETECTED Final    Comment: CRITICAL RESULT CALLED TO, READ BACK BY AND VERIFIED WITH: PHARMD C.DAVIS AT 942 ON 07/01/2024 BY T.SAAD.    Klebsiella aerogenes DETECTED (A) NOT DETECTED Final    Comment: CRITICAL RESULT CALLED TO, READ BACK BY AND VERIFIED WITH: PHARMD C.DAVIS AT 942 ON 07/01/2024 BY T.SAAD.    Klebsiella oxytoca NOT DETECTED NOT DETECTED Final   Klebsiella pneumoniae NOT DETECTED NOT DETECTED Final   Proteus species NOT DETECTED NOT DETECTED Final   Salmonella species NOT DETECTED NOT DETECTED Final   Serratia marcescens NOT DETECTED NOT DETECTED Final   Haemophilus influenzae NOT DETECTED NOT DETECTED Final   Neisseria meningitidis NOT DETECTED NOT DETECTED Final   Pseudomonas aeruginosa NOT DETECTED NOT DETECTED Final   Stenotrophomonas maltophilia NOT DETECTED NOT DETECTED Final   Candida albicans NOT DETECTED NOT DETECTED Final   Candida auris NOT DETECTED NOT DETECTED Final   Candida glabrata NOT DETECTED NOT DETECTED Final   Candida krusei NOT DETECTED NOT DETECTED Final   Candida parapsilosis NOT DETECTED NOT DETECTED Final   Candida tropicalis NOT DETECTED NOT DETECTED Final   Cryptococcus neoformans/gattii NOT DETECTED NOT DETECTED Final   CTX-M ESBL NOT DETECTED NOT DETECTED Final   Carbapenem resistance IMP NOT DETECTED NOT DETECTED Final   Carbapenem resistance KPC NOT DETECTED NOT DETECTED Final   Carbapenem resistance NDM NOT DETECTED NOT DETECTED Final   Carbapenem resist OXA 48 LIKE NOT DETECTED NOT DETECTED Final   Vancomycin  resistance NOT DETECTED NOT DETECTED Final   Carbapenem resistance VIM NOT  DETECTED NOT DETECTED Final    Comment: Performed at [facility],  [address], [place], [place] [address]  Resp panel by RT-PCR (RSV, Flu A&B, Covid) Anterior Nasal Swab     Status: None   Collection Time: 06/30/24  7:58 PM   Specimen: Anterior Nasal Swab  Result Value Ref Range Status   SARS Coronavirus 2 by RT PCR NEGATIVE NEGATIVE Final    Comment: (NOTE) SARS-CoV-2 target nucleic acids are NOT DETECTED.  The SARS-CoV-2 RNA is generally detectable in upper respiratory specimens during the acute phase of infection. The lowest concentration of SARS-CoV-2 viral copies this assay can detect is 138 copies/mL. A negative result does not preclude SARS-Cov-2 infection and should not be used as the sole basis for treatment or other patient management decisions. A negative result may occur with  improper specimen collection/handling, submission of specimen other than nasopharyngeal swab, presence of viral mutation(s) within the areas targeted by this assay, and inadequate number of viral copies(<138 copies/mL). A negative result must be combined with clinical observations, patient history, and epidemiological information. The expected result is Negative.  Fact Sheet for Patients:  BloggerCourse.com  Fact Sheet for Healthcare Providers:  SeriousBroker.it  This test is no t yet approved or cleared by the [place]  FDA and  has been authorized for detection and/or diagnosis of SARS-CoV-2 by FDA under an Emergency Use Authorization (EUA). This EUA will remain  in effect (meaning this test can be used) for the duration of the COVID-19 declaration under Section 564(b)(1) of the Act, 21 U.S.C.section 360bbb-3(b)(1), unless the authorization is terminated  or revoked sooner.       Influenza A by PCR NEGATIVE NEGATIVE Final   Influenza B by PCR NEGATIVE NEGATIVE Final    Comment: (NOTE) The Xpert Xpress SARS-CoV-2/FLU/RSV plus  assay is intended as an aid in the diagnosis of influenza from Nasopharyngeal swab specimens and should not be used as a sole basis for treatment. Nasal washings and aspirates are unacceptable for Xpert Xpress SARS-CoV-2/FLU/RSV testing.  Fact Sheet for Patients: BloggerCourse.com  Fact Sheet for Healthcare Providers: SeriousBroker.it  This test is not yet approved or cleared by the [place]  FDA and has been authorized for detection and/or diagnosis of SARS-CoV-2 by FDA under an Emergency Use Authorization (EUA). This EUA will remain in effect (meaning this test can be used) for the duration of the COVID-19 declaration under Section 564(b)(1) of the Act, 21 U.S.C. section 360bbb-3(b)(1), unless the authorization is terminated or revoked.     Resp Syncytial Virus by PCR NEGATIVE NEGATIVE Final    Comment: (NOTE) Fact Sheet for Patients: BloggerCourse.com  Fact Sheet for Healthcare Providers: SeriousBroker.it  This test is not yet approved or cleared by the [place]  FDA and has been authorized for detection and/or diagnosis of SARS-CoV-2 by FDA under an Emergency Use Authorization (EUA). This EUA will remain in effect (meaning this test can be used) for the duration of the COVID-19 declaration under Section 564(b)(1) of the Act, 21 U.S.C. section 360bbb-3(b)(1), unless the authorization is terminated or revoked.  Performed at [facility], [address] [place], [place] [address]   Respiratory (~20 pathogens) panel by PCR     Status: None   Collection Time: 06/30/24  7:58 PM   Specimen: Nasopharyngeal Swab; Respiratory  Result Value Ref Range Status   Adenovirus NOT DETECTED NOT DETECTED Final   Coronavirus 229E NOT DETECTED NOT DETECTED Final    Comment: (NOTE) The Coronavirus on the Respiratory Panel, DOES NOT test for the novel  Coronavirus  (2019 nCoV)  Coronavirus HKU1 NOT DETECTED NOT DETECTED Final   Coronavirus NL63 NOT DETECTED NOT DETECTED Final   Coronavirus OC43 NOT DETECTED NOT DETECTED Final   Metapneumovirus NOT DETECTED NOT DETECTED Final   Rhinovirus / Enterovirus NOT DETECTED NOT DETECTED Final   Influenza A NOT DETECTED NOT DETECTED Final   Influenza B NOT DETECTED NOT DETECTED Final   Parainfluenza Virus 1 NOT DETECTED NOT DETECTED Final   Parainfluenza Virus 2 NOT DETECTED NOT DETECTED Final   Parainfluenza Virus 3 NOT DETECTED NOT DETECTED Final   Parainfluenza Virus 4 NOT DETECTED NOT DETECTED Final   Respiratory Syncytial Virus NOT DETECTED NOT DETECTED Final   Bordetella pertussis NOT DETECTED NOT DETECTED Final   Bordetella Parapertussis NOT DETECTED NOT DETECTED Final   Chlamydophila pneumoniae NOT DETECTED NOT DETECTED Final   Mycoplasma pneumoniae NOT DETECTED NOT DETECTED Final    Comment: Performed at [facility], [address], [place], [place] [address]  Culture, blood (routine x 2) Call MD if unable to obtain prior to antibiotics being given     Status: None (Preliminary result)   Collection Time: 06/30/24  9:01 PM   Specimen: BLOOD  Result Value Ref Range Status   Specimen Description   Final    BLOOD BLOOD LEFT ARM Performed at [facility], [address] [place], [place] [address]    Special Requests   Final    BOTTLES DRAWN AEROBIC ONLY Blood Culture results may not be optimal due to an inadequate volume of blood received in culture bottles Performed at [facility], [address], [place], [place] [address]    Culture  Setup Time   Final    GRAM POSITIVE COCCI GRAM NEGATIVE RODS AEROBIC BOTTLE ONLY CRITICAL VALUE NOTED.  VALUE IS CONSISTENT WITH PREVIOUSLY REPORTED AND CALLED VALUE. Performed at [facility], [address] [place], [place] [address]    Culture GRAM POSITIVE COCCI GRAM NEGATIVE RODS   Final   Report  Status PENDING  Incomplete  Culture, blood (routine x 2) Call MD if unable to obtain prior to antibiotics being given     Status: Abnormal (Preliminary result)   Collection Time: 06/30/24  9:01 PM   Specimen: BLOOD  Result Value Ref Range Status   Specimen Description   Final    BLOOD BLOOD LEFT HAND Performed at [facility], [address], [place], [place] [address]    Special Requests   Final    BOTTLES DRAWN AEROBIC ONLY Blood Culture results may not be optimal due to an inadequate volume of blood received in culture bottles Performed at [facility], [address] [place], [place] [address]    Culture  Setup Time   Final    GRAM POSITIVE COCCI IN CHAINS AEROBIC BOTTLE ONLY CRITICAL RESULT CALLED TO, READ BACK BY AND VERIFIED WITH: PHARMD ABBY BETHENE [phone] AT 1457 BY EC    Culture (A)  Final    ENTEROCOCCUS FAECALIS SUSCEPTIBILITIES TO FOLLOW Performed at [facility], [address], [place], [place] [address]    Report Status PENDING  Incomplete  Blood Culture ID Panel (Reflexed)     Status: Abnormal   Collection Time: 06/30/24  9:01 PM  Result Value Ref Range Status   Enterococcus faecalis DETECTED (A) NOT DETECTED Final    Comment: CRITICAL RESULT CALLED TO, READ BACK BY AND VERIFIED WITH: PHARMD ABBY ELLINGTON [phone] AT 1457 BY EC  Enterococcus Faecium NOT DETECTED NOT DETECTED Final   Listeria monocytogenes NOT DETECTED NOT DETECTED Final   Staphylococcus species NOT DETECTED NOT DETECTED Final   Staphylococcus aureus (BCID) NOT DETECTED NOT DETECTED Final   Staphylococcus epidermidis NOT DETECTED NOT DETECTED Final   Staphylococcus lugdunensis NOT DETECTED NOT DETECTED Final   Streptococcus species NOT DETECTED NOT DETECTED Final   Streptococcus agalactiae NOT DETECTED NOT DETECTED Final   Streptococcus pneumoniae NOT DETECTED NOT DETECTED Final   Streptococcus pyogenes NOT DETECTED NOT DETECTED Final    A.calcoaceticus-baumannii NOT DETECTED NOT DETECTED Final   Bacteroides fragilis NOT DETECTED NOT DETECTED Final   Enterobacterales NOT DETECTED NOT DETECTED Final   Enterobacter cloacae complex NOT DETECTED NOT DETECTED Final   Escherichia coli NOT DETECTED NOT DETECTED Final   Klebsiella aerogenes NOT DETECTED NOT DETECTED Final   Klebsiella oxytoca NOT DETECTED NOT DETECTED Final   Klebsiella pneumoniae NOT DETECTED NOT DETECTED Final   Proteus species NOT DETECTED NOT DETECTED Final   Salmonella species NOT DETECTED NOT DETECTED Final   Serratia marcescens NOT DETECTED NOT DETECTED Final   Haemophilus influenzae NOT DETECTED NOT DETECTED Final   Neisseria meningitidis NOT DETECTED NOT DETECTED Final   Pseudomonas aeruginosa NOT DETECTED NOT DETECTED Final   Stenotrophomonas maltophilia NOT DETECTED NOT DETECTED Final   Candida albicans NOT DETECTED NOT DETECTED Final   Candida auris NOT DETECTED NOT DETECTED Final   Candida glabrata NOT DETECTED NOT DETECTED Final   Candida krusei NOT DETECTED NOT DETECTED Final   Candida parapsilosis NOT DETECTED NOT DETECTED Final   Candida tropicalis NOT DETECTED NOT DETECTED Final   Cryptococcus neoformans/gattii NOT DETECTED NOT DETECTED Final   Vancomycin  resistance NOT DETECTED NOT DETECTED Final    Comment: Performed at [facility], [address] [place], [place] [address]  Expectorated Sputum Assessment w Gram Stain, Rflx to Resp Cult     Status: None   Collection Time: 06/30/24 10:51 PM   Specimen: Expectorated Sputum  Result Value Ref Range Status   Specimen Description EXPECTORATED SPUTUM  Final   Special Requests NONE  Final   Sputum evaluation   Final    Sputum specimen not acceptable for testing.  Please recollect.   NOTIFIED JEREMY, RN @2326  ON 06/30/24 NT. Performed at [facility], [address] [place], [place] [address]    Report Status 06/30/2024 FINAL  Final  Expectorated Sputum Assessment w  Gram Stain, Rflx to Resp Cult     Status: None   Collection Time: 07/01/24  2:45 AM   Specimen: SPU  Result Value Ref Range Status   Specimen Description SPUTUM  Final   Special Requests NONE  Final   Sputum evaluation   Final    Sputum specimen not acceptable for testing.  Please recollect.   NOTIFIED JEREMY RN OF RECOLLECT @ 0413 ON 07/01/2024 BY MTA Performed at [facility], [address] [place], [place] [address]    Report Status 07/01/2024 FINAL  Final  C Difficile Quick Screen w PCR reflex     Status: None   Collection Time: 07/01/24  8:15 AM   Specimen: STOOL  Result Value Ref Range Status   C Diff antigen NEGATIVE NEGATIVE Final   C Diff toxin NEGATIVE NEGATIVE Final   C Diff interpretation No C. difficile detected.  Final    Comment: Performed at [facility], [address], [place], [place] [address]  MRSA Next Gen by PCR, Nasal  Status: None   Collection Time: 07/01/24  5:44 PM   Specimen: Nasal Mucosa; Nasal Swab  Result Value Ref Range Status   MRSA by PCR Next Gen NOT DETECTED NOT DETECTED Final    Comment: (NOTE) The GeneXpert MRSA Assay (FDA approved for NASAL specimens only), is one component of a comprehensive MRSA colonization surveillance program. It is not intended to diagnose MRSA infection nor to guide or monitor treatment for MRSA infections. Test performance is not FDA approved in patients less than 41 years old. Performed at [facility], [address], [place], [place] [address]          Radiology Studies: DG CHEST PORT 1 VIEW Result Date: 07/01/2024 CLINICAL DATA:  Hypoxia EXAM: PORTABLE CHEST 1 VIEW COMPARISON:  Yesterday FINDINGS: Right Port-A-Cath tip at low SVC or superior caval/atrial junction. Tracheal deviation left, consistent with known right-sided thyroid  mass. Borderline cardiomegaly. Atherosclerosis in the transverse aorta. No pleural effusion or pneumothorax. Underlying  interstitial thickening is likely related to smoking/chronic bronchitis. More confluent right greater than left interstitial and airspace opacities again identified. Progressive, especially on the right. IMPRESSION: Progressive right greater than left interstitial and airspace opacities, suspicious for pneumonia superimposed upon sequelae of smoking/chronic bronchitis. Aortic Atherosclerosis (ICD10-I70.0). Electronically Signed   By: Rockey Kilts M.D.   On: 07/01/2024 09:11   DG Chest 2 View Result Date: 06/30/2024 EXAM: 2 VIEW(S) XRAY OF THE CHEST 06/30/2024 01:41:00 PM COMPARISON: 06/09/2024 CLINICAL HISTORY: SOB \\T \ fever- pt also states he is having slight mid chest pain, coughing up phlegm - Adenocarcinoma of unknown primary ; Hx of PAF, CAD, recent NSTEMI, COPD FINDINGS: LINES AND TUBES: Right chest wall port tip terminates over the SVC. LUNGS AND PLEURA: Bibasilar patchy opacities. No pleural effusion. No pneumothorax. HEART AND MEDIASTINUM: Leftward deviation of the trachea, in keeping with known right thyroid  mass. Unchanged cardiomediastinal silhouette. BONES AND SOFT TISSUES: No acute osseous abnormality. IMPRESSION: 1. Bibasilar patchy opacities, which may represent atelectasis, aspiration, or pneumonia. 2. Leftward deviation of the trachea, in keeping with known right thyroid  mass. 3. Unchanged cardiomediastinal silhouette. Electronically signed by: Limin Xu MD 06/30/2024 01:50 PM EDT RP Workstation: [id number]        Scheduled Meds:  sodium chloride    Intravenous Once   amiodarone   200 mg Oral Daily   atorvastatin   40 mg Oral Daily   budesonide  (PULMICORT ) nebulizer solution  0.5 mg Nebulization BID   Chlorhexidine  Gluconate Cloth  6 each Topical Daily   cholecalciferol   1,000 Units Oral Daily   clopidogrel   75 mg Oral Q breakfast   dorzolamide -timolol   1 drop Both Eyes Daily   filgrastim  (NIVESTYM ) SQ  480 mcg Subcutaneous q1800   guaiFENesin   1,200 mg Oral BID   insulin  aspart   0-9 Units Subcutaneous TID WC   ipratropium  0.5 mg Nebulization BID   levalbuterol   0.63 mg Nebulization BID   metoprolol  succinate  12.5 mg Oral Daily   oxyCODONE   10 mg Oral Q12H   pantoprazole  (PROTONIX ) IV  40 mg Intravenous Q12H   phosphorus  250 mg Oral BID   potassium chloride   40 mEq Oral Q4H   rOPINIRole   1 mg Oral TID   sacubitril -valsartan   1 tablet Oral BID   sodium chloride  flush  10-40 mL Intracatheter Q12H   torsemide   20 mg Oral Daily   Continuous Infusions:  ampicillin  (OMNIPEN) IV 2 g (07/02/24 0930)   ceFEPime  (MAXIPIME ) IV Stopped (07/02/24 0900)     LOS:  2 days    Time spent: 40 minutes    Toribio Hummer, MD [facility]   To contact the attending provider between 7A-7P or the covering provider during after hours 7P-7A, please log into the web site [email or web address] and access using universal [place] password for that web site. If you do not have the password, please call the hospital operator.  07/02/2024, 10:16 AM

## 2024-07-02 NOTE — Progress Notes (Signed)
 Daily Progress Note   Patient Name: Joshua Compton.       Date: 07/02/2024 DOB: 18-Jan-1941  Age: 83 y.o. MRN#: [id number] Attending Physician: Joshua Toribio GAILS, MD Primary Care Physician: Joshua Garnette KIDD, MD Admit Date: 06/30/2024 Length of Stay: 2 days  Reason for Consultation/Follow-up: Establishing goals of care  Subjective:   Reviewed EMR including recent documentation from hospitalist.  Reviewed recent CMP noting potassium 2.9, BUN 20, creatinine elevated at 1.3, and albumin  2.1.  Discussed care with RN for medical updates.  Recent CBC noted WBC 0.4, hemoglobin 9.2, hematocrit 28.5, and platelets 74.  Noted patient continues to have episodes of hemoptysis.  Presented to bedside to see patient.  Multiple family members present at bedside including daughter, Joshua Compton, who this provider had discussed care with yesterday.  Introduced myself as a member of the palliative medicine team my role in patient's medical journey.  Family at bedside included patient's 4 daughters and granddaughter.  We spent time reviewing patient's medical issues at this time.  Daughter noted that Dr. Federico did come back to speak with him yesterday and acknowledged concerns that patient would not tolerate further chemotherapy.  Spent time discussing possible pathways for medical care moving forward.  Family noted that patient has stated to them that he is tired and wants to go home.  Discussed the philosophy of hospice and what support they would and would not provide at home.  Spent time answering questions as able regarding this.  Family noted they would like to proceed with pursuing hospice to get patient home so that the time he has left can be enjoyed where he wants to be with family support.  Family noted that they are familiar with [facility] and so specifically requested that home hospice referral be to them.  Acknowledged this.  Spent time answering questions as able and providing emotional support  via active listening.  Noted palliative medicine team will continue to follow along with patient's medical journey.  Discussed care with hospitalist, RN, TOC, and GI after visit to coordinate care.  Was also able to speak with [facility] liaison later in day to provide medical update regarding patient's care.  [facility] planning to reach out to family to assist with home hospice coordination referral.  Objective:   Vital Signs:  BP (!) 140/37   Pulse 75   Temp 100 F (37.8 C) (Axillary)   Resp 19   Ht 6' (1.829 m)   Wt 85.6 kg   SpO2 93%   BMI 25.59 kg/m   Physical Exam: General: Awake at times though easily falls asleep, ill-appearing, frail Cardiovascular: RRR Respiratory: No increased work of breathing noted, not in respiratory distress Abdomen: not distended Neuro: Awake at times though easily falls asleep during conversation  Assessment & Plan:   Assessment: Patient is an 83 year old male with a past medical history of paroxysmal A-fib on chronic anticoagulation with Eliquis , history of CVA, HFpEF, CAD with recent NSTEMI 05/2024 status post DES to left circumflex on dual antiplatelet therapy of Eliquis  and Plavix , 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  Since admission patient is receiving management for neutropenic fever with noted bacteremia secondary to E. coli, Enterococcus bacillus, and Klebsiella; hemoptysis; pancytopenia; electrolyte abnormalities; metastatic cancer; stage II wound  on left and right buttocks.  ID and oncology consulted for recommendations.  Palliative medicine team consulted to assist with complex medical decision making. Of note patient seen by PMT during hospitalization in July  2025.  Recommendations/Plan: # Complex medical decision making/goals of care:  - Discussed care at patient's bedside with 4 daughters and granddaughter.  Patient was awake at times though would easily fall asleep during conversation.  Again reviewed possible pathways for medical care moving forward.  Expressed concern that with patient's multiple medical interventions, time is growing shorter.  Family noted that patient has discussed with them and stated he is tired and wants to be able to go home.  Discussed patient going home with hospice support and what this would and would not entail.  Family agreeing with pursuing patient going home with hospice, they specifically requested [facility] as their family is familiar with this group.  Have involved TOC to assist with coordination of care.  Palliative medicine team continuing to engage in conversations moving forward as able and appropriate.  -  Code Status: Limited: Do not attempt resuscitation (DNR) -DNR-LIMITED -Do Not Intubate/DNI   # Psychosocial Support:  - 4 daughters, granddaughter  # Discharge Planning: Home with Hospice  - Family specifically requested patient goes home with [facility].  Have involved TOC to assist with coordination of care.  Also attempted to call on-call liaison for [facility] to discuss care.  Discussed with: Patient, patient's 4 daughters at bedside and granddaughter, TOC, hospitalist, RN, GI  Thank you for allowing the palliative care team to participate in the care Joshua Compton.Joshua Tinnie Radar, DO Palliative Care Provider PMT # [phone]  If patient remains symptomatic despite maximum doses, please call PMT at [phone] between 0700 and 1900. Outside of these hours, please call attending, as PMT does not have night coverage.  Personally spent 50 minutes in patient care including extensive chart review (labs, imaging, progress/consult notes, vital signs), medically appropraite exam, discussed  with treatment team, education to patient, family, and staff, documenting clinical information, medication review and management, coordination of care, and available advanced directive documents.

## 2024-07-03 DIAGNOSIS — I480 Paroxysmal atrial fibrillation: Secondary | ICD-10-CM | POA: Diagnosis not present

## 2024-07-03 DIAGNOSIS — Z7189 Other specified counseling: Secondary | ICD-10-CM | POA: Diagnosis not present

## 2024-07-03 DIAGNOSIS — D709 Neutropenia, unspecified: Secondary | ICD-10-CM | POA: Diagnosis not present

## 2024-07-03 DIAGNOSIS — J432 Centrilobular emphysema: Secondary | ICD-10-CM | POA: Diagnosis not present

## 2024-07-03 DIAGNOSIS — K2100 Gastro-esophageal reflux disease with esophagitis, without bleeding: Secondary | ICD-10-CM | POA: Diagnosis not present

## 2024-07-03 DIAGNOSIS — Z515 Encounter for palliative care: Secondary | ICD-10-CM | POA: Diagnosis not present

## 2024-07-03 DIAGNOSIS — Z66 Do not resuscitate: Secondary | ICD-10-CM

## 2024-07-03 DIAGNOSIS — Z8673 Personal history of transient ischemic attack (TIA), and cerebral infarction without residual deficits: Secondary | ICD-10-CM | POA: Diagnosis not present

## 2024-07-03 DIAGNOSIS — I5043 Acute on chronic combined systolic (congestive) and diastolic (congestive) heart failure: Secondary | ICD-10-CM

## 2024-07-03 DIAGNOSIS — D508 Other iron deficiency anemias: Secondary | ICD-10-CM

## 2024-07-03 DIAGNOSIS — I959 Hypotension, unspecified: Secondary | ICD-10-CM

## 2024-07-03 LAB — RENAL FUNCTION PANEL
Albumin: 1.9 g/dL — ABNORMAL LOW (ref 3.5–5.0)
Anion gap: 14 (ref 5–15)
BUN: 25 mg/dL — ABNORMAL HIGH (ref 8–23)
CO2: 30 mmol/L (ref 22–32)
Calcium: 8.1 mg/dL — ABNORMAL LOW (ref 8.9–10.3)
Chloride: 97 mmol/L — ABNORMAL LOW (ref 98–111)
Creatinine, Ser: 1.51 mg/dL — ABNORMAL HIGH (ref 0.61–1.24)
GFR, Estimated: 46 mL/min — ABNORMAL LOW (ref >60)
Glucose, Bld: 87 mg/dL (ref 70–99)
Phosphorus: 2.8 mg/dL (ref 2.5–4.6)
Potassium: 2.9 mmol/L — ABNORMAL LOW (ref 3.5–5.1)
Sodium: 141 mmol/L (ref 135–145)

## 2024-07-03 LAB — CULTURE, BLOOD (ROUTINE X 2)

## 2024-07-03 LAB — CBC WITH DIFFERENTIAL/PLATELET
Abs Immature Granulocytes: 0.08 K/uL — ABNORMAL HIGH (ref 0.00–0.07)
Basophils Absolute: 0 K/uL (ref 0.0–0.1)
Basophils Relative: 2 %
Eosinophils Absolute: 0.1 K/uL (ref 0.0–0.5)
Eosinophils Relative: 3 %
HCT: 28.4 % — ABNORMAL LOW (ref 39.0–52.0)
Hemoglobin: 9 g/dL — ABNORMAL LOW (ref 13.0–17.0)
Immature Granulocytes: 5 %
Lymphocytes Relative: 10 %
Lymphs Abs: 0.2 K/uL — ABNORMAL LOW (ref 0.7–4.0)
MCH: 28.6 pg (ref 26.0–34.0)
MCHC: 31.7 g/dL (ref 30.0–36.0)
MCV: 90.2 fL (ref 80.0–100.0)
Monocytes Absolute: 0.4 K/uL (ref 0.1–1.0)
Monocytes Relative: 24 %
Neutro Abs: 0.9 K/uL — ABNORMAL LOW (ref 1.7–7.7)
Neutrophils Relative %: 56 %
Platelets: 69 K/uL — ABNORMAL LOW (ref 150–400)
RBC: 3.15 MIL/uL — ABNORMAL LOW (ref 4.22–5.81)
RDW: 15.1 % (ref 11.5–15.5)
WBC: 1.7 K/uL — ABNORMAL LOW (ref 4.0–10.5)
nRBC: 0 % (ref 0.0–0.2)

## 2024-07-03 LAB — URINE CULTURE: Culture: >=100000 — AB

## 2024-07-03 LAB — GLUCOSE, CAPILLARY
Glucose-Capillary: 113 mg/dL — ABNORMAL HIGH (ref 70–99)
Glucose-Capillary: 78 mg/dL (ref 70–99)
Glucose-Capillary: 96 mg/dL (ref 70–99)
Glucose-Capillary: 92 mg/dL (ref 70–99)

## 2024-07-03 LAB — MAGNESIUM: Magnesium: 2 mg/dL (ref 1.7–2.4)

## 2024-07-03 LAB — LEGIONELLA PNEUMOPHILA SEROGP 1 UR AG: L. pneumophila Serogp 1 Ur Ag: NEGATIVE

## 2024-07-03 MED ORDER — POTASSIUM CHLORIDE CRYS ER 20 MEQ PO TBCR: 40.0000 meq | EXTENDED_RELEASE_TABLET | ORAL | Status: DC

## 2024-07-03 MED ORDER — HEPARIN SOD (PORK) LOCK FLUSH 100 UNIT/ML IV SOLN
500.0000 [IU] | Freq: Once | INTRAVENOUS | Status: AC
  Administered 2024-07-03: 500 [IU] via INTRAVENOUS
  Filled 2024-07-03: qty 5

## 2024-07-03 MED ORDER — LEVOFLOXACIN 25 MG/ML PO SOLN: 500.0000 mg | Freq: Every day | ORAL | 0 refills | Status: AC

## 2024-07-03 MED ORDER — GUAIFENESIN ER 600 MG PO TB12: 1200.0000 mg | ORAL_TABLET | Freq: Two times a day (BID) | ORAL | 0 refills | Status: AC

## 2024-07-03 MED ORDER — MIDODRINE HCL 5 MG PO TABS: 5.0000 mg | ORAL_TABLET | Freq: Three times a day (TID) | ORAL | 0 refills | Status: DC

## 2024-07-03 MED ORDER — ALBUMIN HUMAN 25 % IV SOLN
25.0000 g | Freq: Once | INTRAVENOUS | Status: AC
  Administered 2024-07-03: 25 g via INTRAVENOUS
  Filled 2024-07-03: qty 100

## 2024-07-03 MED ORDER — LEVALBUTEROL HCL 0.63 MG/3ML IN NEBU: 0.6300 mg | INHALATION_SOLUTION | Freq: Two times a day (BID) | RESPIRATORY_TRACT | 1 refills | Status: DC

## 2024-07-03 MED ORDER — SODIUM CHLORIDE 0.9 % IV BOLUS
1000.0000 mL | Freq: Once | INTRAVENOUS | Status: AC
  Administered 2024-07-03: 1000 mL via INTRAVENOUS

## 2024-07-03 MED ORDER — IPRATROPIUM BROMIDE 0.02 % IN SOLN: 0.5000 mg | Freq: Two times a day (BID) | RESPIRATORY_TRACT | 1 refills | Status: DC

## 2024-07-03 MED ORDER — POTASSIUM CHLORIDE 10 MEQ/100ML IV SOLN
10.0000 meq | INTRAVENOUS | Status: AC
  Administered 2024-07-03 (×5): 10 meq via INTRAVENOUS
  Filled 2024-07-03 (×5): qty 100

## 2024-07-03 MED ORDER — PANTOPRAZOLE SODIUM 40 MG PO PACK: 40.0000 mg | PACK | Freq: Two times a day (BID) | ORAL | 1 refills | Status: DC

## 2024-07-03 MED ORDER — OXYCODONE HCL 5 MG/5ML PO SOLN: 10.0000 mg | ORAL | 0 refills | Status: DC | PRN

## 2024-07-03 MED ORDER — SODIUM CHLORIDE 0.9 % IV SOLN
2.0000 g | Freq: Four times a day (QID) | INTRAVENOUS | Status: DC
  Administered 2024-07-03 (×2): 2 g via INTRAVENOUS
  Filled 2024-07-03 (×2): qty 2000

## 2024-07-03 MED ORDER — MIDODRINE HCL 5 MG PO TABS
5.0000 mg | ORAL_TABLET | Freq: Three times a day (TID) | ORAL | Status: DC
  Administered 2024-07-03: 5 mg via ORAL
  Filled 2024-07-03: qty 1

## 2024-07-03 MED ORDER — LOPERAMIDE HCL 2 MG PO CAPS: 2.0000 mg | ORAL_CAPSULE | ORAL | 0 refills | Status: DC | PRN

## 2024-07-03 MED ORDER — LOPERAMIDE HCL 2 MG PO CAPS: 2.0000 mg | ORAL_CAPSULE | Freq: Once | ORAL | Status: DC

## 2024-07-03 MED ORDER — AMOXICILLIN-POT CLAVULANATE 400-57 MG/5ML PO SUSR: 875.0000 mg | Freq: Two times a day (BID) | ORAL | 0 refills | Status: AC

## 2024-07-03 NOTE — Discharge Summary (Signed)
 Physician Discharge Summary  Joshua Compton. FMW:[id number] DOB: September 27, 1941 DOA: 06/30/2024  PCP: Joshua Garnette KIDD, MD  Admit date: 06/30/2024 Discharge date: 07/03/2024  Time spent: 60 minutes  Recommendations for Outpatient Follow-up:  Patient be discharged home with hospice following. Follow-up with hospice MD. Follow-up with Dr. Federico, oncology in 2 weeks all previously scheduled.   Discharge Diagnoses:  Principal Problem:   Neutropenic fever (HCC) Active Problems:   Bacteremia   NSTEMI (non-ST elevated myocardial infarction) (HCC)   PAF (paroxysmal atrial fibrillation) (HCC)   Chronic kidney disease, stage 3a (HCC)   Chronic diastolic CHF (congestive heart failure) (HCC)   Centrilobular emphysema (HCC)   Adenocarcinoma of unknown primary (HCC)   History of CVA (cerebrovascular accident)   Gastroesophageal reflux disease with esophagitis without hemorrhage   Type 2 diabetes mellitus with hyperlipidemia (HCC)   Anemia   Cerebrovascular accident (CVA) due to embolism of left middle cerebral artery (HCC)   Venous insufficiency of both lower extremities   Cancer, metastatic to bone (HCC)   Acute on chronic combined systolic and diastolic CHF (congestive heart failure) (HCC)   Pancytopenia (HCC)   Pressure injury of skin   CAP (community acquired pneumonia)   Hemoptysis   Diarrhea   Palliative care encounter   Goals of care, counseling/discussion   DNR (do not resuscitate)   Counseling and coordination of care   Hypotension   Discharge Condition: Stable  Diet recommendation: Comfort feeds/regular  Filed Weights   06/30/24 1807  Weight: 85.6 kg    History of present illness:  Joshua Compton. is a 83 y.o. male with medical history significant of paroxysmal A-fib on chronic anticoagulation with Eliquis , history of CVA, HFimpEF, CAD with recent non-STEMI 05/2024 status post DES to left circumflex on dual antiplatelet therapy of Eliquis  and Plavix , history  of metastatic adenocarcinoma involving the bones and muscle of unknown primary, thyroid  mass with pathology consistent with malignancy, chronic respiratory failure secondary to COPD on 2 L home O2 presented from the cancer center with concerns for neutropenic fever. Patient at time of interview drowsy and as such history obtained from daughters who are at bedside.  It is noted that patient had 3 days of fevers with temp as high as 100.8, an episode of diarrhea which has since resolved, productive cough of brownish sputum, some shortness of breath, urinary frequency, chills, complains of sore throat.  Patient and daughters deny any nausea, no vomiting, no chest pain, no abdominal pain, no constipation, no dysuria, no melena, no hematemesis, no hematochezia, no lightheadedness, no dizziness. Patient seen by his oncologist on day of admission labs obtained with a comprehensive metabolic profile with a potassium of 2.9, calcium  of 8.5, albumin  of 3.0, AST of 45, ALT of 57, total protein of 5.7, total bilirubin of 1.1 otherwise within normal limits.  Magnesium  noted at 1.8.  CBC with a white count of 0.2 with a ANC of 0.0, hemoglobin of 8.4, platelet count of 67.  Urinalysis done cloudy, moderate hemoglobin, nitrite negative, leukocytes negative, greater than 300 protein, many bacteria, 11-20 WBCs.  1 set of blood cultures was obtained at the oncology center. Chest x-ray done with bibasilar patchy opacities which may represent atelectasis, aspiration or pneumonia.  Leftward deviation of the trachea, in keeping with known right thyroid  mass.  Unchanged cardiomediastinal silhouette. Patient's oncologist called for admission for further evaluation management and IV antibiotics.  Hospital Course:  #1 neutropenic fever/?CAP/bacteremia -Patient presented with drowsiness, fevers x 3 days, cough, shortness of  breath and noted with a severe neutropenia with white count of 0.2 and ANC of 0.0. - Chest x-ray done with  bibasilar patchy opacities which may represent atelectasis or pneumonia. - Patient with some lower extremity wounds with some redness and concern for possible cellulitis. - Urinalysis nitrite negative leukocyte negative. - Blood cultures obtained with 2/2 bottles with BCID growing Enterococcus faecalis, E. coli, Klebsiella aerogenes - Chest x-ray, 07/01/2024, with progressive right greater than left interstitial and airspace opacities suspicious for pneumonia superimposed upon sequela of smoking/chronic bronchitis.  -Procalcitonin noted at 1.11.   - Urine cultures pending.  - Urine strep pneumococcus antigen negative.  - Urine Legionella antigen pending.  - Stool studies negative for C. difficile, GI pathogen panel negative..  - Respiratory viral panel negative. - SARS coronavirus 2 by PCR negative, influenza A and B by PCR negative, RSV by PCR negative. - Patient seen in consultation by oncology who stated patient had G-CSF after last chemotherapy treatment recently and did not need any further G-CSF.   - Discontinued G-CSF per oncology recommendations per -Discontinued vancomycin . - Patient was maintained on IV cefepime  and IV ampicillin  while in-house. -Palliative care met with family and decision made to go home with hospice. -Family would like antibiotics on discharge and per ID patient can be transition to Augmentin  875 mg twice daily x 10 days and Levaquin  500 milligrams daily x 5 days. -Patient will be discharged home with hospice following.   2.  Bacteremia secondary to E. Coli, Enterococcus faecalis, Klebsiella aerogenes.  -??  Etiology. -Urine cultures pending. -Patient was on IV vancomycin  and IV cefepime  on admission. -Discontinued IV vancomycin . - Patient maintained on IV cefepime  and IV ampicillin  per ID. - 2D echo done with no vegetations noted.   - Patient being followed by palliative care and decision made to go home with hospice.   - Family would like continued  antibiotics on discharge.   - ID recommended Augmentin  875 twice daily x 10 days and Levaquin  500 mg x 5 days on discharge.  -Patient will discharge home on antibiotics as recommended by ID and will be discharged home with hospice following.   3.  Hemoptysis versus GI bleed -??  Etiology -Chest x-ray concerning for community-acquired pneumonia in the immunocompromised patient presented with neutropenic fevers in the setting of thrombocytopenia. -Patient noted to be on dual antiplatelet therapy of Eliquis  and Plavix  prior to admission secondary to recent non-STEMI status post DES. -Spoke with patient's primary cardiologist on admission and recommended holding Eliquis  with continuation of Plavix  due to recent stent placement. - Hemoglobin noted at 7.1 on 07/01/2024, status post transfusion 2 units PRBCs with hemoglobin noted at 9.0 on day of discharge.  -Hemoptysis improved. -FOBT positive -Patient maintained on IV antibiotics during the hospitalization. - Family met with palliative care and patient being transitioned to home with hospice on discharge.   4.  Pancytopenia -Secondary to recent chemotherapy. - Patient pancultured secondary to problem #1. - Patient noted to have received G-CSF per family on Monday, 06/27/2024. - Patient noted to have started chemotherapy on 06/24/2024 and per daughters has completed 9 radiation treatments. - Patient with a bacteremia with blood cultures growing Enterococcus faecalis, E. coli, Klebsiella erogenous.  -Hemoglobin noted at 7.1(07/01/2024). -Anemia panel with iron level of 16, TIBC of 139, ferritin of 464, folate of 11.3, vitamin B12 of 343. - Status post transfusion 2 units PRBCs with hemoglobin currently at 9.0 by day of discharge. -Platelet count noted at 57K on 07/01/2024,,  patient with hemoptysis and as such patient transfuse 1 unit platelets with platelet count currently at 69 K.  -FOBT positive - Follow H&H and transfuse for hemoglobin < 8. -Due to  anemia, positive FOBT, hemoptysis versus??  GI patient was seen in consultation by GI and has patient and family leaning more towards home with hospice per GI no role for endoscopic evaluation at this time. - Patient's primary oncologist is aware of patient's admission. - Patient seen by palliative care and patient be discharged home with hospice following.    5.  Anemia -Patient presentation with a pancytopenia felt likely secondary to recent chemotherapy. -Hemoglobin noted to drop as low as 7.1 (07/01/2024) and patient transfused 2 units PRBCs with hemoglobin currently at 9.0 this morning. -Anemia panel with iron level of 16, TIBC of 139, ferritin of 464, folate of 11.3, vitamin B12 of 343. -FOBT noted to be positive. -Patient with hemoptysis versus GI bleed - Patient maintained on IV PPI every 12 hours during the hospitalization.  -Diet advanced and patient tolerated regular. - Patient assessed by GI and asked patient and family met with palliative care and decision made to go home with hospice per GI no role for endoscopic evaluation needed at this time.  -Patient will be discharged home with hospice following.   6.  Metastatic adenocarcinoma involving the bones and muscle, primary unknown -Patient noted to have recently started chemotherapy on 06/24/2024. - Per daughters at bedside patient has already completed 9 radiation treatments. -Per daughter his next chemotherapy treatment is on 07/15/2024. -Patient now with hemoptysis, bacteremia with 3 separate organisms, community-acquired pneumonia, neutropenic fevers, pancytopenia with worsening platelets count. -Patient with poor prognosis. -Oncology informed of admission and have assessed patient during hospitalization..   - Palliative care consulted and following and per palliative care and discussion with family they are focusing more on comfort measures and would like patient to be discharged home with hospice..   -Patient will be discharged  home with hospice following   7.  Hypokalemia - Secondary to GI losses.   - Repleted during the hospitalization. -Patient will be discharged home with hospice following.   8.  Paroxysmal atrial fibrillation - Patient noted during the hospitalization to go into atrial fibrillation however remained rate controlled.   - Patient maintained on home regimen of amiodarone  and Toprol -XL for rate control.  - Patient with a thrombocytopenia with platelet count of 69 K after transfusion of 1 unit platelets.. -Patient with improving hemoptysis. - Eliquis  was held during the hospitalization and will not be resumed on discharge.   - Patient seen by palliative care and patient will be discharged home with hospice following.    9.  CAD/recent non-STEMI with PCI -Patient noted to have been on triple therapy x 1 week, aspirin  discontinued and patient recommended to continue on Eliquis  and Plavix . - Due to thrombocytopenia Eliquis  held and will not be resumed on discharge as patient also noted with hemoptysis.   - Patient maintained on Plavix  during the hospitalization.   - Patient will be discharged home with hospice following.   10.  COPD/chronic respiratory failure on 2 L nasal cannula -Stable. - Discontinued Breztri . - Patient was maintained on Pulmicort  twice daily, scheduled Xopenex  and Atrovent  nebs.    11.  Acute on chronic combined systolic and diastolic heart failure/chronic HFimpEF - Patient noted to have been started back on torsemide  20 mg daily on day of admission per his cardiologist note as patient was seen by cardiologist this morning. -  BNP noted elevated at 650.5. -Patient status post transfusion 2 units PRBCs and 1 unit platelets. - Patient maintained on home regimen Toprol -XL, Entresto . -Discontinued torsemide . - Status post Lasix  20 mg IV every 12 hours x 2 doses.   - Patient clinically improved likely close to euvolemia.   - Patient with a bump in creatinine.   - IV antibiotics  were discontinued. - Could likely resume home regimen torsemide  in the next 24 to 48 hours.  -Patient seen by palliative care and decision made to transition home with hospice following.   12.  Bilateral lower extremity wounds/?  Cellulitis -Patient with worsening wounds on lower extremities with some erythema, some warmth and some tenderness to palpation. - Concern for possible cellulitis in the setting of chronic venous stasis changes. - Wound care RN consulted and dressing changes recommended. -Patient also on empiric IV antibiotics during the hospitalization will be discharged home with hospice on oral antibiotics to complete a course of treatment.   13.  GERD - Patient maintained on PPI during the hospitalization.      14.  History of CVA -Felt likely secondary to paroxysmal atrial fibrillation. - Patient maintained on statin during the hospitalization. - Eliquis  on hold due to thrombocytopenia and hemoptysis. -Patient may transition to comfort measures and will be discharged home with hospice following.   15.  Diarrhea -Patient with multiple watery loose stools. - Laxatives discontinued. - C. difficile PCR negative.   - GI pathogen panel negative.   - Improved with Imodium .    16.  Pressure injury left buttock stage II/right buttock stage II, POA/lower extremity vascular ulcers.POA - WOC RN consulted and assessed patient and recommendations made..   17.  Hypotension -Patient noted to be hypotensive on day of discharge on 07/03/2024 with BP of 69/36 initially with systolics improvement into the 90s with IV fluid bolus and IV albumin . - Patient placed on midodrine  5 mg 3 times daily.. - Likely multifactorial secondary to bacteremia in the setting of decreased oral intake.  - Patient seen by palliative care, patient and family decided on comfort measures and patient be discharged home with hospice.      Procedures: Chest x-ray 06/30/2024, 07/01/2024 Transfused 2 units PRBC  07/01/2024 Transfuse 1 unit platelet 07/01/2024 2D echo 07/02/2024  Consultations: ID: Dr.Manandhar 07/01/2024 Oncology: Dr. Federico 07/01/2024 GI: Dr. Stacia 07/02/2024  Discharge Exam: Vitals:   07/03/24 1449 07/03/24 1500  BP: (!) 98/40 (!) 90/28  Pulse: 72 72  Resp: 20 (!) 23  Temp:    SpO2: 95% 95%    General: NAD Cardiovascular: Irregularly irregular Respiratory: Some) sounds in the bases.  No wheezing.  Fair air movement.  No crackles.  Discharge Instructions   Discharge Instructions     Diet general   Complete by: As directed    Discharge wound care:   Complete by: As directed    Wound care  Every 3 days     Comments: Silicone foam dressings to the buttock wounds,      change every 3 days. ASSESS UNDER dressings each shift for any acute changes in the wounds.  07/01/24 0917      07/01/24 0917    Wound care  Every other day    Comments: Cleanse LE wounds with saline, pat dry. Cover with single layer of xeroform top with foam. Change every other day  07/01/24 0917   Increase activity slowly   Complete by: As directed       Allergies as of  07/03/2024       Reactions   Iodinated Contrast Media Anaphylaxis, Swelling, Other (See Comments)   Tongue swelled when he had MRI in [facility] Intubated for airway protection after oral and throat swelling   Pravastatin  Other (See Comments)   Muscle aches        Medication List     PAUSE taking these medications    Eliquis  5 MG Tabs tablet Wait to take this until your doctor or other care provider tells you to start again. Generic drug: apixaban  Take 1 tablet (5 mg total) by mouth 2 (two) times daily.   torsemide  20 MG tablet Wait to take this until your doctor or other care provider tells you to start again. Commonly known as: DEMADEX  Take 1 tablet (20 mg total) by mouth daily.       STOP taking these medications    atorvastatin  40 MG tablet Commonly known as: LIPITOR   Oxycodone  HCl 10 MG  Tabs Replaced by: oxyCODONE  5 MG/5ML solution   pantoprazole  40 MG tablet Commonly known as: PROTONIX  Replaced by: pantoprazole  sodium 40 mg       TAKE these medications    amiodarone  200 MG tablet Commonly known as: PACERONE  Take 1 tablet (200 mg total) by mouth daily.   amoxicillin -clavulanate 400-57 MG/5ML suspension Commonly known as: AUGMENTIN  Take 10.9 mLs (875 mg total) by mouth 2 (two) times daily for 10 days.   cholecalciferol  25 MCG (1000 UNIT) tablet Commonly known as: VITAMIN D3 TAKE 1 TABLET BY MOUTH EVERY DAY   clopidogrel  75 MG tablet Commonly known as: PLAVIX  Take 1 tablet (75 mg total) by mouth daily with breakfast.   dorzolamide -timolol  2-0.5 % ophthalmic solution Commonly known as: COSOPT  Place 1 drop into both eyes in the morning.   Entresto  24-26 MG Generic drug: sacubitril -valsartan  Take 1 tablet by mouth 2 (two) times daily.   guaiFENesin  600 MG 12 hr tablet Commonly known as: MUCINEX  Take 2 tablets (1,200 mg total) by mouth 2 (two) times daily for 7 days.   ipratropium 0.02 % nebulizer solution Commonly known as: ATROVENT  Take 2.5 mLs (0.5 mg total) by nebulization 2 (two) times daily.   levalbuterol  0.63 MG/3ML nebulizer solution Commonly known as: XOPENEX  Take 3 mLs (0.63 mg total) by nebulization 2 (two) times daily.   levofloxacin  25 MG/ML solution Commonly known as: LEVAQUIN  Take 20 mLs (500 mg total) by mouth daily for 5 days.   lidocaine -prilocaine  cream Commonly known as: EMLA  Apply to affected area once What changed:  how much to take how to take this when to take this reasons to take this additional instructions   loperamide  2 MG capsule Commonly known as: IMODIUM  Take 1 capsule (2 mg total) by mouth as needed for diarrhea or loose stools.   metoprolol  succinate 25 MG 24 hr tablet Commonly known as: TOPROL -XL TAKE 0.5 TABLETS (12.5 MG TOTAL) BY MOUTH DAILY. FOR BLOOD PRESSURE CONTROL   midodrine  5 MG  tablet Commonly known as: PROAMATINE  Take 1 tablet (5 mg total) by mouth 3 (three) times daily with meals.   ondansetron  8 MG tablet Commonly known as: Zofran  Take 1 tablet (8 mg total) by mouth every 8 (eight) hours as needed for nausea or vomiting. Start on the third day after chemotherapy.   oxyCODONE  5 MG/5ML solution Commonly known as: ROXICODONE  Take 10 mLs (10 mg total) by mouth every 4 (four) hours as needed for severe pain (pain score 7-10). Replaces: Oxycodone  HCl 10 MG Tabs   pantoprazole   sodium 40 mg Commonly known as: PROTONIX  Take 40 mg by mouth 2 (two) times daily. Replaces: pantoprazole  40 MG tablet   prochlorperazine  10 MG tablet Commonly known as: COMPAZINE  Take 1 tablet (10 mg total) by mouth every 6 (six) hours as needed for nausea or vomiting.   rOPINIRole  1 MG tablet Commonly known as: REQUIP  Take 1 tablet (1 mg total) by mouth 3 (three) times daily. Plus an extra tablet at bedtime to total four a day What changed:  when to take this additional instructions   Trelegy Ellipta  100-62.5-25 MCG/ACT Aepb Generic drug: Fluticasone -Umeclidin-Vilant Inhale 1 puff into the lungs daily.   Xtampza  ER 9 MG C12a Generic drug: oxyCODONE  ER Take 9 mg by mouth every 12 (twelve) hours as needed. What changed: reasons to take this               Discharge Care Instructions  (From admission, onward)           Start     Ordered   07/03/24 0000  Discharge wound care:       Comments: Wound care  Every 3 days     Comments: Silicone foam dressings to the buttock wounds,      change every 3 days. ASSESS UNDER dressings each shift for any acute changes in the wounds.  07/01/24 0917      07/01/24 0917    Wound care  Every other day    Comments: Cleanse LE wounds with saline, pat dry. Cover with single layer of xeroform top with foam. Change every other day  07/01/24 0917   07/03/24 1524           Allergies  Allergen Reactions   Iodinated Contrast Media  Anaphylaxis, Swelling and Other (See Comments)    Tongue swelled when he had MRI in [facility]  Intubated for airway protection after oral and throat swelling   Pravastatin  Other (See Comments)    Muscle aches    Follow-up Information     [facility] Follow up.   Why: [facility]) Contact information: [address] [place] [address] [phone]         Joshua Compton Norleen [name], MD Follow up in 2 week(s).   Specialty: Hematology and Oncology Why: Follow-up as scheduled or in 2 weeks. Contact information: [address] [place] [address] [phone]         Hospice MD Follow up.                   The results of significant diagnostics from this hospitalization (including imaging, microbiology, ancillary and laboratory) are listed below for reference.    Significant Diagnostic Studies: ECHOCARDIOGRAM COMPLETE Result Date: 07/02/2024    ECHOCARDIOGRAM REPORT   Patient Name:   Jacobo Moncrief. Date of Exam: 07/02/2024 Medical Rec #:  [id number]             Height:       72.0 in Accession #:    [id number]            Weight:       188.7 lb Date of Birth:  1941-04-29             BSA:          2.079 m Patient Age:    83 years              BP:           129/30 mmHg Patient Gender:  M                     HR:           73 bpm. Exam Location:  Inpatient Procedure: 2D Echo, 3D Echo, Color Doppler, Cardiac Doppler, Intracardiac            Opacification Agent and Strain Analysis (Both Spectral and Color Flow            Doppler were utilized during procedure). Indications:    Bacteremia  History:        Patient has prior history of Echocardiogram examinations, most                 recent 06/09/2024. CHF, Stroke; Risk Factors:Hypertension and                 Dyslipidemia. Cancer, GERD.  Sonographer:    Logan Shove RDCS Referring Phys: [id number] [facility]  Sonographer Comments: Suboptimal parasternal window. IMPRESSIONS  1. Left ventricular ejection fraction,  by estimation, is 45 to 50%. The left ventricle has mildly decreased function. The left ventricle has no regional wall motion abnormalities. There is mild asymmetric left ventricular hypertrophy of the basal-septal  segment. Left ventricular diastolic parameters are indeterminate.  2. Right ventricular systolic function is normal. The right ventricular size is normal. Tricuspid regurgitation signal is inadequate for assessing PA pressure.  3. The mitral valve is grossly normal. Mild to moderate mitral valve regurgitation. No evidence of mitral stenosis.  4. Caclified aortic valve, possibly bicuspid. The aortic valve is calcified. Aortic valve regurgitation is mild to moderate. Aortic valve sclerosis/calcification is present, without any evidence of aortic stenosis.  5. The inferior vena cava is normal in size with greater than 50% respiratory variability, suggesting right atrial pressure of 3 mmHg. Comparison(s): No significant change from prior study. Conclusion(s)/Recommendation(s): No evidence of valvular vegetations on this transthoracic echocardiogram. Consider a transesophageal echocardiogram to exclude infective endocarditis if clinically indicated. FINDINGS  Left Ventricle: Left ventricular ejection fraction, by estimation, is 45 to 50%. The left ventricle has mildly decreased function. The left ventricle has no regional wall motion abnormalities. The left ventricular internal cavity size was normal in size. There is mild asymmetric left ventricular hypertrophy of the basal-septal segment. Left ventricular diastolic parameters are indeterminate. Right Ventricle: The right ventricular size is normal. No increase in right ventricular wall thickness. Right ventricular systolic function is normal. Tricuspid regurgitation signal is inadequate for assessing PA pressure. Left Atrium: Left atrial size was normal in size. Right Atrium: Right atrial size was normal in size. Pericardium: There is no evidence of  pericardial effusion. Mitral Valve: The mitral valve is grossly normal. Mild to moderate mitral valve regurgitation. No evidence of mitral valve stenosis. Tricuspid Valve: The tricuspid valve is grossly normal. Tricuspid valve regurgitation is trivial. No evidence of tricuspid stenosis. Aortic Valve: Caclified aortic valve, possibly bicuspid. The aortic valve is calcified. Aortic valve regurgitation is mild to moderate. Aortic regurgitation PHT measures 277 msec. Aortic valve sclerosis/calcification is present, without any evidence of aortic stenosis. Aortic valve peak gradient measures 16.2 mmHg. Pulmonic Valve: The pulmonic valve was grossly normal. Pulmonic valve regurgitation is trivial. No evidence of pulmonic stenosis. Aorta: The aortic root is normal in size and structure. Venous: The inferior vena cava is normal in size with greater than 50% respiratory variability, suggesting right atrial pressure of 3 mmHg. IAS/Shunts: The atrial septum is grossly normal.  LEFT VENTRICLE PLAX 2D LVIDd:  6.00 cm      Diastology LVIDs:         4.00 cm      LV e' medial:    6.31 cm/s LV PW:         1.00 cm      LV E/e' medial:  10.7 LV IVS:        1.20 cm      LV e' lateral:   14.10 cm/s LVOT diam:     2.40 cm      LV E/e' lateral: 4.8 LVOT Area:     4.52 cm  LV Volumes (MOD) LV vol d, MOD A2C: 285.0 ml 3D Volume EF: LV vol d, MOD A4C: 310.0 ml 3D EF:        55 % LV vol s, MOD A2C: 144.0 ml LV EDV:       225 ml LV vol s, MOD A4C: 139.0 ml LV ESV:       101 ml LV SV MOD A2C:     141.0 ml LV SV:        124 ml LV SV MOD A4C:     310.0 ml LV SV MOD BP:      156.5 ml RIGHT VENTRICLE             IVC RV Basal diam:  4.30 cm     IVC diam: 1.50 cm RV Mid diam:    2.40 cm RV S prime:     17.80 cm/s TAPSE (M-mode): 2.9 cm LEFT ATRIUM              Index        RIGHT ATRIUM           Index LA diam:        3.70 cm  1.78 cm/m   RA Area:     13.70 cm LA Vol (A2C):   113.0 ml 54.36 ml/m  RA Volume:   30.20 ml  14.53 ml/m LA Vol  (A4C):   60.7 ml  29.20 ml/m LA Biplane Vol: 83.7 ml  40.27 ml/m  AORTIC VALVE AV Area (Vmax): 2.24 cm AV Vmax:        201.00 cm/s AV Peak Grad:   16.2 mmHg LVOT Vmax:      99.50 cm/s AI PHT:         277 msec  AORTA Ao Root diam: 3.90 cm MITRAL VALVE MV Area (PHT): 3.46 cm       SHUNTS MV Decel Time: 219 msec       Systemic Diam: 2.40 cm MR Peak grad:    106.9 mmHg MR Mean grad:    71.0 mmHg MR Vmax:         517.00 cm/s MR Vmean:        392.0 cm/s MR PISA:         2.26 cm MR PISA Eff ROA: 17 mm MR PISA Radius:  0.60 cm MV E velocity: 67.30 cm/s MV A velocity: 58.50 cm/s MV E/A ratio:  1.15 Darryle Decent MD Electronically signed by Darryle Decent MD Signature Date/Time: 07/02/2024/11:29:01 AM    Final    DG CHEST PORT 1 VIEW Result Date: 07/02/2024 CLINICAL DATA:  Hypoxia, neutropenic fever EXAM: PORTABLE CHEST 1 VIEW COMPARISON:  07/01/2024 FINDINGS: Port in the anterior chest wall with tip in distal SVC. Normal cardiac silhouette. Bibasilar airspace disease. Airspace disease in the RIGHT lower lobe is more dense than the LEFT. No interval change. IMPRESSION: Bibasilar airspace disease, RIGHT  greater than LEFT. No interval change. Electronically Signed   By: Jackquline Boxer M.D.   On: 07/02/2024 10:16   DG CHEST PORT 1 VIEW Result Date: 07/01/2024 CLINICAL DATA:  Hypoxia EXAM: PORTABLE CHEST 1 VIEW COMPARISON:  Yesterday FINDINGS: Right Port-A-Cath tip at low SVC or superior caval/atrial junction. Tracheal deviation left, consistent with known right-sided thyroid  mass. Borderline cardiomegaly. Atherosclerosis in the transverse aorta. No pleural effusion or pneumothorax. Underlying interstitial thickening is likely related to smoking/chronic bronchitis. More confluent right greater than left interstitial and airspace opacities again identified. Progressive, especially on the right. IMPRESSION: Progressive right greater than left interstitial and airspace opacities, suspicious for pneumonia superimposed  upon sequelae of smoking/chronic bronchitis. Aortic Atherosclerosis (ICD10-I70.0). Electronically Signed   By: Rockey Kilts M.D.   On: 07/01/2024 09:11   DG Chest 2 View Result Date: 06/30/2024 EXAM: 2 VIEW(S) XRAY OF THE CHEST 06/30/2024 01:41:00 PM COMPARISON: 06/09/2024 CLINICAL HISTORY: SOB \\T \ fever- pt also states he is having slight mid chest pain, coughing up phlegm - Adenocarcinoma of unknown primary ; Hx of PAF, CAD, recent NSTEMI, COPD FINDINGS: LINES AND TUBES: Right chest wall port tip terminates over the SVC. LUNGS AND PLEURA: Bibasilar patchy opacities. No pleural effusion. No pneumothorax. HEART AND MEDIASTINUM: Leftward deviation of the trachea, in keeping with known right thyroid  mass. Unchanged cardiomediastinal silhouette. BONES AND SOFT TISSUES: No acute osseous abnormality. IMPRESSION: 1. Bibasilar patchy opacities, which may represent atelectasis, aspiration, or pneumonia. 2. Leftward deviation of the trachea, in keeping with known right thyroid  mass. 3. Unchanged cardiomediastinal silhouette. Electronically signed by: Manford Cummins MD 06/30/2024 01:50 PM EDT RP Workstation: [id number]   IR IMAGING GUIDED PORT INSERTION Result Date: 06/20/2024 INDICATION: 83 year old male with history of metastatic adenocarcinoma requiring central venous access for chemotherapy administration. EXAM: IMPLANTED PORT A CATH PLACEMENT WITH ULTRASOUND AND FLUOROSCOPIC GUIDANCE COMPARISON:  None Available. MEDICATIONS: None. ANESTHESIA/SEDATION: Moderate (conscious) sedation was employed during this procedure. A total of Versed  1 mg and Fentanyl  50 mcg was administered intravenously. Moderate Sedation Time: 15 minutes. The patient's level of consciousness and vital signs were monitored continuously by radiology nursing throughout the procedure under my direct supervision. CONTRAST:  None FLUOROSCOPY TIME:  5.8 mGy reference air kerma COMPLICATIONS: None immediate. PROCEDURE: The procedure, risks, benefits, and  alternatives were explained to the patient. Questions regarding the procedure were encouraged and answered. The patient understands and consents to the procedure. The right neck and chest were prepped with chlorhexidine  in a sterile fashion, and a sterile drape was applied covering the operative field. Maximum barrier sterile technique with sterile gowns and gloves were used for the procedure. A timeout was performed prior to the initiation of the procedure. Ultrasound was used to examine the jugular vein which was compressible and free of internal echoes. A skin marker was used to demarcate the planned venotomy and port pocket incision sites. Local anesthesia was provided to these sites and the subcutaneous tunnel track with 1% lidocaine  with 1:100,000 epinephrine . A small incision was created at the jugular access site and blunt dissection was performed of the subcutaneous tissues. Under ultrasound guidance, the jugular vein was accessed with a 21 ga micropuncture needle and an 0.018 wire was inserted to the superior vena cava. Real-time ultrasound guidance was utilized for vascular access including the acquisition of a permanent ultrasound image documenting patency of the accessed vessel. A 5 Fr micopuncture set was then used, through which a 0.035 Rosen wire was passed under fluoroscopic guidance into the inferior vena  cava. An 8 Fr dilator was then placed over the wire. A subcutaneous port pocket was then created along the upper chest wall utilizing a combination of sharp and blunt dissection. The pocket was irrigated with sterile saline, packed with gauze, and observed for hemorrhage. A single lumen plastic power injectable port was chosen for placement. The 8 Fr catheter was tunneled from the port pocket site to the venotomy incision. The port was placed in the pocket. The external catheter was trimmed to appropriate length. The dilator was exchanged for an 8 Fr peel-away sheath under fluoroscopic guidance.  The catheter was then placed through the sheath and the sheath was removed. Final catheter positioning was confirmed and documented with a fluoroscopic spot radiograph. The port was accessed with a Huber needle, aspirated, and flushed with heparinized saline. The deep dermal layer of the port pocket incision was closed with interrupted 3-0 Vicryl suture. Dermabond was then placed over the port pocket and neck incisions. The patient tolerated the procedure well without immediate post procedural complication. FINDINGS: After catheter placement, the tip lies within the superior cavoatrial junction. The catheter aspirates and flushes normally and is ready for immediate use. IMPRESSION: Successful placement of a power injectable Port-A-Cath via the right internal jugular vein. The catheter is ready for immediate use. Ester Sides, MD [facility] Electronically Signed   By: Ester Sides M.D.   On: 06/20/2024 10:46   CARDIAC CATHETERIZATION Result Date: 06/13/2024 Images from the original result were not included.   Mid Cx to Dist Cx lesion is 80% stenosed. Dist Cx lesion is 90% stenosed.  TIMI-3 flow   OCT guided IV L performed on both lesions using a 3.5 mm x 12 mm IV L balloon   A drug-eluting stent was successfully placed covering both lesions, using a STENT ONYX FRONTIER [id number].  The proximal 30 mm stent was postdilated to 3.75 mm with the stent itself deployed to 3.6 mm distally.  Post intervention, there is a 0% residual stenosis across both lesions; TIMI-3 flow maintained.   Mid LAD lesion is 70% stenosed.   RPDA lesion is 50% stenosed.   Dist Cx lesion is 90% stenosed.   Post intervention, there is a 0% residual stenosis.   Post intervention, there is a 0% residual stenosis.   LV end diastolic pressure is normal.   There is no aortic valve stenosis.   In the absence of any other complications or medical issues, we expect the patient to be ready for  discharge from an interventional cardiology perspective on 06/14/2024.   Recommend to resume Apixaban , at currently prescribed dose and frequency on 06/14/2024.   Recommend concurrent antiplatelet therapy of Aspirin  81 mg for 1 month and Clopidogrel  75mg  daily for 12 months . Diagnostic Dominance: Co-dominant     Intervention POST-OPERATIVE DIAGNOSIS:  Codominant system Severe two-vessel disease: 80% calcified/fibrotic stenosis in the mid LCx just after OM1 followed by a 90% focal heavily calcified lesion just after OM 2 going into the AV groove. Successful complex OCT guided shockwave lithotripsy PTCA and DES PCI of mid and distal LCx lesions treated with Onyx frontier DES 3.5 mm x 38 mm postdilated and taper fashion from 3.75 to 3.6 mm reducing lesion to 0% with TIMI-3 flow maintained. Focal 70% LAD between 1st & 2nd Diag branches Consider staged PCI at a later date to allow recovery from prolonged procedure. Normal codominant RCA with borderline 50% stenosis in the mid PDA. Normal LVEDP. PLAN OF CARE:  Return to nursing unit for ongoing care post PCI; recommend aspirin  and Plavix  x 1 month along with restarting DOAC in the morning => With long stent would try to continue Plavix  DOAC to complete 1 year given ACS presentation but stop aspirin  after 1 month; Recommend to resume Apixaban , at currently prescribed dose and frequency on 06/14/2024. Recommend concurrent antiplatelet therapy of Aspirin  81 mg for 1 month and Clopidogrel  75mg  daily for 12 months . For the remaining LAD lesion, discussed the possibility of medical management versus staged PCI next week to allow time for full recovery.   In the absence of any other complications or medical issues, we expect the patient to be ready for discharge from an interventional cardiology perspective on 06/14/2024. [name], MD  ECHOCARDIOGRAM COMPLETE Result Date: 06/09/2024    ECHOCARDIOGRAM REPORT   Patient Name:   Anmol Fleck. Date of Exam: 06/09/2024  Medical Rec #:  [id number]             Height:       72.0 in Accession #:    [id number]            Weight:       192.0 lb Date of Birth:  Mar 24, 1941             BSA:          2.094 m Patient Age:    83 years              BP:           149/62 mmHg Patient Gender: M                     HR:           68 bpm. Exam Location:  [facility] Procedure: 2D Echo, Cardiac Doppler and Color Doppler (Both Spectral and Color            Flow Doppler were utilized during procedure). Indications:    Chest Pain R07.9  History:        Patient has prior history of Echocardiogram examinations, most                 recent 12/16/2023. CHF, Stroke, Arrythmias:Atrial Fibrillation;                 Risk Factors:Hypertension and Dyslipidemia.  Sonographer:    Aida Pizza RCS Referring Phys: [phone] [facility]  Sonographer Comments: Suboptimal parasternal window. IMPRESSIONS  1. Global hypokinesis slightly worse in inferior wall . Left ventricular ejection fraction, by estimation, is 50 to 55%. The left ventricle has low normal function. The left ventricle demonstrates global hypokinesis. The left ventricular internal cavity  size was mildly dilated. There is mild left ventricular hypertrophy. Left ventricular diastolic parameters are consistent with Grade I diastolic dysfunction (impaired relaxation).  2. Right ventricular systolic function is normal. The right ventricular size is normal.  3. The mitral valve is abnormal. Mild mitral valve regurgitation. No evidence of mitral stenosis.  4. The aortic valve was not well visualized. There is moderate calcification of the aortic valve. There is moderate thickening of the aortic valve. Aortic valve regurgitation is mild to moderate. Aortic valve sclerosis/calcification is present, without any evidence of aortic stenosis.  5. The inferior vena cava is normal in size with greater than 50% respiratory variability, suggesting right atrial pressure of 3 mmHg. FINDINGS  Left Ventricle: Global  hypokinesis slightly worse in inferior wall. Left ventricular ejection fraction, by  estimation, is 50 to 55%. The left ventricle has low normal function. The left ventricle demonstrates global hypokinesis. Strain was performed and  the global longitudinal strain is indeterminate. The left ventricular internal cavity size was mildly dilated. There is mild left ventricular hypertrophy. Left ventricular diastolic parameters are consistent with Grade I diastolic dysfunction (impaired relaxation). Right Ventricle: The right ventricular size is normal. No increase in right ventricular wall thickness. Right ventricular systolic function is normal. Left Atrium: Left atrial size was normal in size. Right Atrium: Right atrial size was normal in size. Pericardium: There is no evidence of pericardial effusion. Mitral Valve: The mitral valve is abnormal. There is mild thickening of the mitral valve leaflet(s). There is mild calcification of the mitral valve leaflet(s). Mild mitral valve regurgitation. No evidence of mitral valve stenosis. Tricuspid Valve: The tricuspid valve is normal in structure. Tricuspid valve regurgitation is not demonstrated. No evidence of tricuspid stenosis. Aortic Valve: The aortic valve was not well visualized. There is moderate calcification of the aortic valve. There is moderate thickening of the aortic valve. Aortic valve regurgitation is mild to moderate. Aortic valve sclerosis/calcification is present, without any evidence of aortic stenosis. Aortic valve mean gradient measures 9.0 mmHg. Aortic valve peak gradient measures 16.2 mmHg. Aortic valve area, by VTI measures 2.26 cm. Pulmonic Valve: The pulmonic valve was normal in structure. Pulmonic valve regurgitation is not visualized. No evidence of pulmonic stenosis. Aorta: The aortic root is normal in size and structure. Venous: The inferior vena cava is normal in size with greater than 50% respiratory variability, suggesting right atrial  pressure of 3 mmHg. IAS/Shunts: No atrial level shunt detected by color flow Doppler. Additional Comments: 3D was performed not requiring image post processing on an independent workstation and was indeterminate.  LEFT VENTRICLE PLAX 2D LVIDd:         5.70 cm   Diastology LVIDs:         4.20 cm   LV e' medial:    8.92 cm/s LV PW:         1.00 cm   LV E/e' medial:  5.8 LV IVS:        1.20 cm   LV e' lateral:   13.20 cm/s LVOT diam:     2.30 cm   LV E/e' lateral: 3.9 LV SV:         104 LV SV Index:   50 LVOT Area:     4.15 cm  RIGHT VENTRICLE RV S prime:     17.30 cm/s TAPSE (M-mode): 3.0 cm LEFT ATRIUM             Index        RIGHT ATRIUM           Index LA diam:        3.60 cm 1.72 cm/m   RA Area:     16.90 cm LA Vol (A2C):   80.4 ml 38.40 ml/m  RA Volume:   39.30 ml  18.77 ml/m LA Vol (A4C):   46.5 ml 22.21 ml/m LA Biplane Vol: 64.2 ml 30.66 ml/m  AORTIC VALVE AV Area (Vmax):    2.48 cm AV Area (Vmean):   2.21 cm AV Area (VTI):     2.26 cm AV Vmax:           201.00 cm/s AV Vmean:          142.000 cm/s AV VTI:            0.462 m  AV Peak Grad:      16.2 mmHg AV Mean Grad:      9.0 mmHg LVOT Vmax:         120.00 cm/s LVOT Vmean:        75.400 cm/s LVOT VTI:          0.252 m LVOT/AV VTI ratio: 0.54  AORTA Ao Root diam: 4.00 cm MITRAL VALVE MV Area (PHT): 1.71 cm    SHUNTS MV Decel Time: 444 msec    Systemic VTI:  0.25 m MV E velocity: 51.50 cm/s  Systemic Diam: 2.30 cm MV A velocity: 84.20 cm/s MV E/A ratio:  0.61 Maude Emmer MD Electronically signed by Maude Emmer MD Signature Date/Time: 06/09/2024/3:19:25 PM    Final    CT Angio Chest Pulmonary Embolism (PE) W or WO Contrast Result Date: 06/09/2024 CLINICAL DATA:  Pulmonary embolism suspected, high probability history of prostate cancer EXAM: CT ANGIOGRAPHY CHEST WITH CONTRAST TECHNIQUE: Multidetector CT imaging of the chest was performed using the standard protocol during bolus administration of intravenous contrast. Multiplanar CT image  reconstructions and MIPs were obtained to evaluate the vascular anatomy. RADIATION DOSE REDUCTION: This exam was performed according to the departmental dose-optimization program which includes automated exposure control, adjustment of the mA and/or kV according to patient size and/or use of iterative reconstruction technique. CONTRAST:  75mL OMNIPAQUE  IOHEXOL  350 MG/ML SOLN COMPARISON:  Multiple priors dated back to September 05, 2023. FINDINGS: Cardiovascular: The heart size is normal. Atherosclerotic calcifications of coronary arteries. No pericardial fluid. Mediastinum/Nodes: Right thyroid  lobe nodule measuring 3.6 cm. No enlarged mediastinal, hilar, or axillary lymph nodes. Trachea, and esophagus demonstrate no significant findings. Lungs/Pleura: Severe upper lobe predominant centrilobular emphysematous changes. Bilateral lower lobe fibrotic changes with associated architectural distortion, the paraseptal emphysematous changes, bronchiectasis and bronchiolectasis. Bronchial and bronchiolar wall thickening. Left lower lobe subpleural consolidation/mass measuring 3.6 x 2.2 cm, stable to prior however gradually enlarging to September 05, 2023 which measured 1 x 1.4 cm. Multiple pulmonary nodules are stable to prior as follows: Right upper lobe ground-glass nodule in subpleural measuring 4 mm (4/66). Multiple perifissural nodules along the right minor fissure (4/73). These nodules are stable to prior. No new nodule.  No pleural effusion. Upper Abdomen: Multiple simple cortical cysts of the left kidney. Small hiatal hernia. Thickening of bilateral multiple subcutaneous adrenal gland query small nodules measuring 1 cm. Musculoskeletal: Lytic expansile osseous metastasis involving the posterior left fourth rib, stable to prior. Multiple soft tissue nodules in bilateral anterior chest wall for example image 4/100, 54. Additional small subcentimeter nodule in left breast region (4/60. Mild gynecomastia. Review of the MIP  images confirms the above findings. IMPRESSION: No evidence of pulmonary embolus. Multiple pulmonary nodules, subcutaneous soft tissue nodules and left posterior fourth rib lytic expansile lesion, stable to prior. Follow-up according to oncologic protocols. Stable right thyroid  nodule. 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 Left lower lobe subpleural consolidation/mass versus round atelectasis stable to prior, however gradually enlarging to September 05, 2023. Malignancy cannot be excluded. Recommend PET-CT for further assessment. Electronically Signed   By: Megan  Zare M.D.   On: 06/09/2024 12:51   DG Chest Port 1 View Result Date: 06/09/2024 CLINICAL DATA:  Chest pains radiating posteriorly. EXAM: PORTABLE CHEST 1 VIEW COMPARISON:  CTA chest 06/05/2024 FINDINGS: Emphysematous and chronic changes of the lungs. Focal left lower lobe opacity on the prior study is not seen today. Heart size and vascular pattern are normal. Mediastinum is stable  with aortic tortuosity and atherosclerosis. The bones are demineralized. IMPRESSION: 1. Emphysematous and chronic changes of the lungs. No acute chest findings. 2. Aortic atherosclerosis. Electronically Signed   By: Francis Quam M.D.   On: 06/09/2024 07:08   CT Angio Chest PE W and/or Wo Contrast Result Date: 06/05/2024 EXAM: CTA of the Chest with contrast for PE 06/05/2024 07:47:26 PM TECHNIQUE: CTA of the chest was performed after the administration of intravenous contrast. Multiplanar reformatted images are provided for review. MIP images are provided for review. Automated exposure control, iterative reconstruction, and/or weight based adjustment of the mA/kV was utilized to reduce the radiation dose to as low as reasonably achievable. COMPARISON: PET/CT dated 04/29/2024. CLINICAL HISTORY: Pulmonary embolism (PE) suspected, low to intermediate prob, positive D-dimer. FINDINGS: PULMONARY ARTERIES: Pulmonary arteries are  adequately opacified for evaluation. No evidence of pulmonary embolism. MEDIASTINUM: 3.6 cm right thyroid  mass, corresponding to the patient's biopsy proven thyroid  versus metastatic malignancy. Moderate 3-vessel coronary atherosclerosis. LYMPH NODES: No suspicious mediastinal lymphadenopathy on CT. LUNGS AND PLEURA: Moderate centrilobular and paraseptal emphysematous changes, upper lung predominant. Mild patchy posterior lower lobe opacities, likely atelectasis. No pneumothorax. UPPER ABDOMEN: Simple left renal cysts measuring up to 4.9 cm, benign (Bosniak 1). No follow up is recommended. SOFT TISSUES AND BONES: Lytic metastasis involving the left posterior fourth rib, without pathologic fracture. Mild degenerative changes of the visualized thoracolumbar spine. AORTA: Although not tailored for evaluation of the thoracic aorta, there is no evidence of thoracic aortic aneurysm or dissection. Thoracic aortic atherosclerosis. IMPRESSION: 1. No evidence of pulmonary embolism. 2. 3.6 cm right thyroid  mass, corresponding to the patient's biopsy proven primary vs metastatic malignancy. 3. Lytic metastasis involving the left posterior fourth rib, without pathologic fracture. Electronically signed by: Pinkie Pebbles MD 06/05/2024 08:02 PM EDT RP Workstation: [id number]   DG Chest 2 View Result Date: 06/05/2024 CLINICAL DATA:  Chest pain EXAM: CHEST - 2 VIEW COMPARISON:  X-ray 04/16/2024. PET-CT 04/29/2024 FINDINGS: Hyperinflation. Chronic lung changes. No consolidation, pneumothorax or effusion. No edema. Normal cardiopericardial silhouette. Slight increased density to the extreme upper mediastinum. Please correlate for known history of enlarged thyroid  gland. There is a nodular density along the lower left hemithorax along the anterior aspect of the left fifth rib. IMPRESSION: Hyperinflation with chronic changes. Asymmetric density in the lower left thorax. Based on the prior PET-CT this could be an area of nodular  opacity of the lung. Recommend follow-up. Electronically Signed   By: Ranell Bring M.D.   On: 06/05/2024 14:16    Microbiology: Recent Results (from the past 240 hours)  Urine Culture     Status: Abnormal   Collection Time: 06/30/24  2:02 PM   Specimen: Urine, Clean Catch  Result Value Ref Range Status   Specimen Description   Final    URINE, CLEAN CATCH Performed at [facility], [address] [place], [place] [address]    Special Requests   Final    NONE Performed at [facility], [address] [place], [place] [address]    Culture (A)  Final    >=100,000 COLONIES/mL ENTEROBACTER CLOACAE 80,000 COLONIES/mL ENTEROCOCCUS FAECALIS    Report Status 07/03/2024 FINAL  Final   Organism ID, Bacteria ENTEROBACTER CLOACAE (A)  Final   Organism ID, Bacteria ENTEROCOCCUS FAECALIS (A)  Final      Susceptibility   Enterobacter cloacae - MIC*    CEFEPIME  <=0.12 SENSITIVE Sensitive     ERTAPENEM <=0.12 SENSITIVE Sensitive  CIPROFLOXACIN <=0.06 SENSITIVE Sensitive     GENTAMICIN <=1 SENSITIVE Sensitive     NITROFURANTOIN 64 INTERMEDIATE Intermediate     TRIMETH/SULFA <=20 SENSITIVE Sensitive     PIP/TAZO Value in next row Sensitive ug/mL     <=4 SENSITIVEThis is a modified FDA-approved test that has been validated and its performance characteristics determined by the reporting laboratory.  This laboratory is certified under the Clinical Laboratory Improvement Amendments CLIA as qualified to perform high complexity clinical laboratory testing.    MEROPENEM Value in next row Sensitive      <=4 SENSITIVEThis is a modified FDA-approved test that has been validated and its performance characteristics determined by the reporting laboratory.  This laboratory is certified under the Clinical Laboratory Improvement Amendments CLIA as qualified to perform high complexity clinical laboratory testing.    * >=100,000 COLONIES/mL ENTEROBACTER CLOACAE    Enterococcus faecalis - MIC*    AMPICILLIN  Value in next row Sensitive      <=4 SENSITIVEThis is a modified FDA-approved test that has been validated and its performance characteristics determined by the reporting laboratory.  This laboratory is certified under the Clinical Laboratory Improvement Amendments CLIA as qualified to perform high complexity clinical laboratory testing.    NITROFURANTOIN Value in next row Sensitive      <=4 SENSITIVEThis is a modified FDA-approved test that has been validated and its performance characteristics determined by the reporting laboratory.  This laboratory is certified under the Clinical Laboratory Improvement Amendments CLIA as qualified to perform high complexity clinical laboratory testing.    VANCOMYCIN  Value in next row Sensitive      <=4 SENSITIVEThis is a modified FDA-approved test that has been validated and its performance characteristics determined by the reporting laboratory.  This laboratory is certified under the Clinical Laboratory Improvement Amendments CLIA as qualified to perform high complexity clinical laboratory testing.    * 80,000 COLONIES/mL ENTEROCOCCUS FAECALIS  Culture, blood (Routine X 2) w Reflex to ID Panel     Status: Abnormal (Preliminary result)   Collection Time: 06/30/24  3:18 PM   Specimen: BLOOD  Result Value Ref Range Status   Specimen Description BLOOD LEFT ANTECUBITAL  Final   Special Requests   Final    BOTTLES DRAWN AEROBIC AND ANAEROBIC Blood Culture adequate volume   Culture  Setup Time   Final    GRAM NEGATIVE RODS GRAM POSITIVE COCCI IN PAIRS IN BOTH AEROBIC AND ANAEROBIC BOTTLES CRITICAL RESULT CALLED TO, READ BACK BY AND VERIFIED WITH: PHARMD C.DAVIS AT 942 ON 07/01/2024 BY T.SAAD.    Culture (A)  Final    KLEBSIELLA AEROGENES ESCHERICHIA COLI GRAM POSITIVE COCCI SUSCEPTIBILITIES TO FOLLOW CULTURE REINCUBATED FOR BETTER GROWTH Performed at [facility], [address] [place], [place] [address]     Report Status PENDING  Incomplete  Blood Culture ID Panel (Reflexed)     Status: Abnormal   Collection Time: 06/30/24  3:18 PM  Result Value Ref Range Status   Enterococcus faecalis DETECTED (A) NOT DETECTED Final    Comment: CRITICAL RESULT CALLED TO, READ BACK BY AND VERIFIED WITH: PHARMD C.DAVIS AT 942 ON 07/01/2024 BY T.SAAD.    Enterococcus Faecium NOT DETECTED NOT DETECTED Final   Listeria monocytogenes NOT DETECTED NOT DETECTED Final   Staphylococcus species NOT DETECTED NOT DETECTED Final   Staphylococcus aureus (BCID) NOT DETECTED NOT DETECTED Final   Staphylococcus epidermidis NOT DETECTED NOT DETECTED Final   Staphylococcus lugdunensis NOT DETECTED NOT DETECTED Final   Streptococcus species  NOT DETECTED NOT DETECTED Final   Streptococcus agalactiae NOT DETECTED NOT DETECTED Final   Streptococcus pneumoniae NOT DETECTED NOT DETECTED Final   Streptococcus pyogenes NOT DETECTED NOT DETECTED Final   A.calcoaceticus-baumannii NOT DETECTED NOT DETECTED Final   Bacteroides fragilis NOT DETECTED NOT DETECTED Final   Enterobacterales DETECTED (A) NOT DETECTED Final    Comment: CRITICAL RESULT CALLED TO, READ BACK BY AND VERIFIED WITH: PHARMD C.DAVIS AT 942 ON 07/01/2024 BY T.SAAD.    Enterobacter cloacae complex NOT DETECTED NOT DETECTED Final   Escherichia coli DETECTED (A) NOT DETECTED Final    Comment: CRITICAL RESULT CALLED TO, READ BACK BY AND VERIFIED WITH: PHARMD C.DAVIS AT 942 ON 07/01/2024 BY T.SAAD.    Klebsiella aerogenes DETECTED (A) NOT DETECTED Final    Comment: CRITICAL RESULT CALLED TO, READ BACK BY AND VERIFIED WITH: PHARMD C.DAVIS AT 942 ON 07/01/2024 BY T.SAAD.    Klebsiella oxytoca NOT DETECTED NOT DETECTED Final   Klebsiella pneumoniae NOT DETECTED NOT DETECTED Final   Proteus species NOT DETECTED NOT DETECTED Final   Salmonella species NOT DETECTED NOT DETECTED Final   Serratia marcescens NOT DETECTED NOT DETECTED Final   Haemophilus influenzae NOT  DETECTED NOT DETECTED Final   Neisseria meningitidis NOT DETECTED NOT DETECTED Final   Pseudomonas aeruginosa NOT DETECTED NOT DETECTED Final   Stenotrophomonas maltophilia NOT DETECTED NOT DETECTED Final   Candida albicans NOT DETECTED NOT DETECTED Final   Candida auris NOT DETECTED NOT DETECTED Final   Candida glabrata NOT DETECTED NOT DETECTED Final   Candida krusei NOT DETECTED NOT DETECTED Final   Candida parapsilosis NOT DETECTED NOT DETECTED Final   Candida tropicalis NOT DETECTED NOT DETECTED Final   Cryptococcus neoformans/gattii NOT DETECTED NOT DETECTED Final   CTX-M ESBL NOT DETECTED NOT DETECTED Final   Carbapenem resistance IMP NOT DETECTED NOT DETECTED Final   Carbapenem resistance KPC NOT DETECTED NOT DETECTED Final   Carbapenem resistance NDM NOT DETECTED NOT DETECTED Final   Carbapenem resist OXA 48 LIKE NOT DETECTED NOT DETECTED Final   Vancomycin  resistance NOT DETECTED NOT DETECTED Final   Carbapenem resistance VIM NOT DETECTED NOT DETECTED Final    Comment: Performed at [facility], [address] [place], [place] [address]  Resp panel by RT-PCR (RSV, Flu A&B, Covid) Anterior Nasal Swab     Status: None   Collection Time: 06/30/24  7:58 PM   Specimen: Anterior Nasal Swab  Result Value Ref Range Status   SARS Coronavirus 2 by RT PCR NEGATIVE NEGATIVE Final    Comment: (NOTE) SARS-CoV-2 target nucleic acids are NOT DETECTED.  The SARS-CoV-2 RNA is generally detectable in upper respiratory specimens during the acute phase of infection. The lowest concentration of SARS-CoV-2 viral copies this assay can detect is 138 copies/mL. A negative result does not preclude SARS-Cov-2 infection and should not be used as the sole basis for treatment or other patient management decisions. A negative result may occur with  improper specimen collection/handling, submission of specimen other than nasopharyngeal swab, presence of viral mutation(s) within the areas targeted  by this assay, and inadequate number of viral copies(<138 copies/mL). A negative result must be combined with clinical observations, patient history, and epidemiological information. The expected result is Negative.  Fact Sheet for Patients:  BloggerCourse.com  Fact Sheet for Healthcare Providers:  SeriousBroker.it  This test is no t yet approved or cleared by the [place]  FDA and  has been authorized for detection and/or diagnosis of SARS-CoV-2 by FDA  under an Emergency Use Authorization (EUA). This EUA will remain  in effect (meaning this test can be used) for the duration of the COVID-19 declaration under Section 564(b)(1) of the Act, 21 U.S.C.section 360bbb-3(b)(1), unless the authorization is terminated  or revoked sooner.       Influenza A by PCR NEGATIVE NEGATIVE Final   Influenza B by PCR NEGATIVE NEGATIVE Final    Comment: (NOTE) The Xpert Xpress SARS-CoV-2/FLU/RSV plus assay is intended as an aid in the diagnosis of influenza from Nasopharyngeal swab specimens and should not be used as a sole basis for treatment. Nasal washings and aspirates are unacceptable for Xpert Xpress SARS-CoV-2/FLU/RSV testing.  Fact Sheet for Patients: BloggerCourse.com  Fact Sheet for Healthcare Providers: SeriousBroker.it  This test is not yet approved or cleared by the [place]  FDA and has been authorized for detection and/or diagnosis of SARS-CoV-2 by FDA under an Emergency Use Authorization (EUA). This EUA will remain in effect (meaning this test can be used) for the duration of the COVID-19 declaration under Section 564(b)(1) of the Act, 21 U.S.C. section 360bbb-3(b)(1), unless the authorization is terminated or revoked.     Resp Syncytial Virus by PCR NEGATIVE NEGATIVE Final    Comment: (NOTE) Fact Sheet for Patients: BloggerCourse.com  Fact  Sheet for Healthcare Providers: SeriousBroker.it  This test is not yet approved or cleared by the [place]  FDA and has been authorized for detection and/or diagnosis of SARS-CoV-2 by FDA under an Emergency Use Authorization (EUA). This EUA will remain in effect (meaning this test can be used) for the duration of the COVID-19 declaration under Section 564(b)(1) of the Act, 21 U.S.C. section 360bbb-3(b)(1), unless the authorization is terminated or revoked.  Performed at [facility], [address], [place], [place] [address]   Respiratory (~20 pathogens) panel by PCR     Status: None   Collection Time: 06/30/24  7:58 PM   Specimen: Nasopharyngeal Swab; Respiratory  Result Value Ref Range Status   Adenovirus NOT DETECTED NOT DETECTED Final   Coronavirus 229E NOT DETECTED NOT DETECTED Final    Comment: (NOTE) The Coronavirus on the Respiratory Panel, DOES NOT test for the novel  Coronavirus (2019 nCoV)    Coronavirus HKU1 NOT DETECTED NOT DETECTED Final   Coronavirus NL63 NOT DETECTED NOT DETECTED Final   Coronavirus OC43 NOT DETECTED NOT DETECTED Final   Metapneumovirus NOT DETECTED NOT DETECTED Final   Rhinovirus / Enterovirus NOT DETECTED NOT DETECTED Final   Influenza A NOT DETECTED NOT DETECTED Final   Influenza B NOT DETECTED NOT DETECTED Final   Parainfluenza Virus 1 NOT DETECTED NOT DETECTED Final   Parainfluenza Virus 2 NOT DETECTED NOT DETECTED Final   Parainfluenza Virus 3 NOT DETECTED NOT DETECTED Final   Parainfluenza Virus 4 NOT DETECTED NOT DETECTED Final   Respiratory Syncytial Virus NOT DETECTED NOT DETECTED Final   Bordetella pertussis NOT DETECTED NOT DETECTED Final   Bordetella Parapertussis NOT DETECTED NOT DETECTED Final   Chlamydophila pneumoniae NOT DETECTED NOT DETECTED Final   Mycoplasma pneumoniae NOT DETECTED NOT DETECTED Final    Comment: Performed at [facility], [address]  [place], [place] [address]  Culture, blood (routine x 2) Call MD if unable to obtain prior to antibiotics being given     Status: Abnormal (Preliminary result)   Collection Time: 06/30/24  9:01 PM   Specimen: BLOOD  Result Value Ref Range Status   Specimen Description   Final    BLOOD  BLOOD LEFT ARM Performed at [facility], [address] [place], [place] [address]    Special Requests   Final    BOTTLES DRAWN AEROBIC ONLY Blood Culture results may not be optimal due to an inadequate volume of blood received in culture bottles Performed at [facility], [address] [place], [place] [address]    Culture  Setup Time   Final    GRAM POSITIVE COCCI GRAM NEGATIVE RODS AEROBIC BOTTLE ONLY CRITICAL VALUE NOTED.  VALUE IS CONSISTENT WITH PREVIOUSLY REPORTED AND CALLED VALUE. Performed at [facility], [address] [place], [place] [address]    Culture ENTEROCOCCUS FAECALIS ESCHERICHIA COLI  (A)  Final   Report Status PENDING  Incomplete  Culture, blood (routine x 2) Call MD if unable to obtain prior to antibiotics being given     Status: Abnormal   Collection Time: 06/30/24  9:01 PM   Specimen: BLOOD  Result Value Ref Range Status   Specimen Description   Final    BLOOD BLOOD LEFT HAND Performed at [facility], [address], [place], [place] [address]    Special Requests   Final    BOTTLES DRAWN AEROBIC ONLY Blood Culture results may not be optimal due to an inadequate volume of blood received in culture bottles Performed at [facility], [address] [place], [place] [address]    Culture  Setup Time   Final    GRAM POSITIVE COCCI IN CHAINS AEROBIC BOTTLE ONLY CRITICAL RESULT CALLED TO, READ BACK BY AND VERIFIED WITH: PHARMD ABBY BETHENE [phone] AT 1457 BY EC Performed at [facility], [address], [place], [place] [address]    Culture ENTEROCOCCUS FAECALIS (A)  Final   Report  Status 07/03/2024 FINAL  Final   Organism ID, Bacteria ENTEROCOCCUS FAECALIS  Final      Susceptibility   Enterococcus faecalis - MIC*    AMPICILLIN  <=2 SENSITIVE Sensitive     VANCOMYCIN  1 SENSITIVE Sensitive     GENTAMICIN SYNERGY RESISTANT Resistant     * ENTEROCOCCUS FAECALIS  Blood Culture ID Panel (Reflexed)     Status: Abnormal   Collection Time: 06/30/24  9:01 PM  Result Value Ref Range Status   Enterococcus faecalis DETECTED (A) NOT DETECTED Final    Comment: CRITICAL RESULT CALLED TO, READ BACK BY AND VERIFIED WITH: PHARMD ABBY BETHENE [phone] AT 1457 BY EC    Enterococcus Faecium NOT DETECTED NOT DETECTED Final   Listeria monocytogenes NOT DETECTED NOT DETECTED Final   Staphylococcus species NOT DETECTED NOT DETECTED Final   Staphylococcus aureus (BCID) NOT DETECTED NOT DETECTED Final   Staphylococcus epidermidis NOT DETECTED NOT DETECTED Final   Staphylococcus lugdunensis NOT DETECTED NOT DETECTED Final   Streptococcus species NOT DETECTED NOT DETECTED Final   Streptococcus agalactiae NOT DETECTED NOT DETECTED Final   Streptococcus pneumoniae NOT DETECTED NOT DETECTED Final   Streptococcus pyogenes NOT DETECTED NOT DETECTED Final   A.calcoaceticus-baumannii NOT DETECTED NOT DETECTED Final   Bacteroides fragilis NOT DETECTED NOT DETECTED Final   Enterobacterales NOT DETECTED NOT DETECTED Final   Enterobacter cloacae complex NOT DETECTED NOT DETECTED Final   Escherichia coli NOT DETECTED NOT DETECTED Final   Klebsiella aerogenes NOT DETECTED NOT DETECTED Final   Klebsiella oxytoca NOT DETECTED NOT DETECTED Final   Klebsiella pneumoniae NOT DETECTED NOT DETECTED Final   Proteus species NOT DETECTED NOT DETECTED Final   Salmonella species NOT DETECTED NOT  DETECTED Final   Serratia marcescens NOT DETECTED NOT DETECTED Final   Haemophilus influenzae NOT DETECTED NOT DETECTED Final   Neisseria meningitidis NOT DETECTED NOT DETECTED Final   Pseudomonas aeruginosa NOT  DETECTED NOT DETECTED Final   Stenotrophomonas maltophilia NOT DETECTED NOT DETECTED Final   Candida albicans NOT DETECTED NOT DETECTED Final   Candida auris NOT DETECTED NOT DETECTED Final   Candida glabrata NOT DETECTED NOT DETECTED Final   Candida krusei NOT DETECTED NOT DETECTED Final   Candida parapsilosis NOT DETECTED NOT DETECTED Final   Candida tropicalis NOT DETECTED NOT DETECTED Final   Cryptococcus neoformans/gattii NOT DETECTED NOT DETECTED Final   Vancomycin  resistance NOT DETECTED NOT DETECTED Final    Comment: Performed at [facility], [address], [place], [place] [address]  Expectorated Sputum Assessment w Gram Stain, Rflx to Resp Cult     Status: None   Collection Time: 06/30/24 10:51 PM   Specimen: Expectorated Sputum  Result Value Ref Range Status   Specimen Description EXPECTORATED SPUTUM  Final   Special Requests NONE  Final   Sputum evaluation   Final    Sputum specimen not acceptable for testing.  Please recollect.   NOTIFIED JEREMY, RN @2326  ON 06/30/24 NT. Performed at [facility], [address] [place], [place] [address]    Report Status 06/30/2024 FINAL  Final  Expectorated Sputum Assessment w Gram Stain, Rflx to Resp Cult     Status: None   Collection Time: 07/01/24  2:45 AM   Specimen: SPU  Result Value Ref Range Status   Specimen Description SPUTUM  Final   Special Requests NONE  Final   Sputum evaluation   Final    Sputum specimen not acceptable for testing.  Please recollect.   NOTIFIED JEREMY RN OF RECOLLECT @ 0413 ON 07/01/2024 BY MTA Performed at [facility], [address] [place], [place] [address]    Report Status 07/01/2024 FINAL  Final  C Difficile Quick Screen w PCR reflex     Status: None   Collection Time: 07/01/24  8:15 AM   Specimen: STOOL  Result Value Ref Range Status   C Diff antigen NEGATIVE NEGATIVE Final   C Diff toxin NEGATIVE NEGATIVE Final   C Diff interpretation No C.  difficile detected.  Final    Comment: Performed at [facility], [address] [place], [place] [address]  Gastrointestinal Panel by PCR , Stool     Status: None   Collection Time: 07/01/24  8:15 AM   Specimen: STOOL  Result Value Ref Range Status   Campylobacter species NOT DETECTED NOT DETECTED Final   Plesimonas shigelloides NOT DETECTED NOT DETECTED Final   Salmonella species NOT DETECTED NOT DETECTED Final   Yersinia enterocolitica NOT DETECTED NOT DETECTED Final   Vibrio species NOT DETECTED NOT DETECTED Final   Vibrio cholerae NOT DETECTED NOT DETECTED Final   Enteroaggregative E coli (EAEC) NOT DETECTED NOT DETECTED Final   Enteropathogenic E coli (EPEC) NOT DETECTED NOT DETECTED Final   Enterotoxigenic E coli (ETEC) NOT DETECTED NOT DETECTED Final   Shiga like toxin producing E coli (STEC) NOT DETECTED NOT DETECTED Final   Shigella/Enteroinvasive E coli (EIEC) NOT DETECTED NOT DETECTED Final   Cryptosporidium NOT DETECTED NOT DETECTED Final   Cyclospora cayetanensis NOT DETECTED NOT DETECTED Final   Entamoeba histolytica NOT DETECTED NOT DETECTED Final   Giardia lamblia NOT DETECTED NOT DETECTED Final   Adenovirus F40/41 NOT  DETECTED NOT DETECTED Final   Astrovirus NOT DETECTED NOT DETECTED Final   Norovirus GI/GII NOT DETECTED NOT DETECTED Final   Rotavirus A NOT DETECTED NOT DETECTED Final   Sapovirus (I, II, IV, and V) NOT DETECTED NOT DETECTED Final    Comment: Performed at [facility], [address] [place], [place] [address]  MRSA Next Gen by PCR, Nasal     Status: None   Collection Time: 07/01/24  5:44 PM   Specimen: Nasal Mucosa; Nasal Swab  Result Value Ref Range Status   MRSA by PCR Next Gen NOT DETECTED NOT DETECTED Final    Comment: (NOTE) The GeneXpert MRSA Assay (FDA approved for NASAL specimens only), is one component of a comprehensive MRSA colonization surveillance program. It is not intended to diagnose MRSA infection  nor to guide or monitor treatment for MRSA infections. Test performance is not FDA approved in patients less than 44 years old. Performed at [facility], [address], [place], [place] [address]   Culture, blood (Routine X 2) w Reflex to ID Panel     Status: None (Preliminary result)   Collection Time: 07/02/24  4:54 AM   Specimen: BLOOD RIGHT HAND  Result Value Ref Range Status   Specimen Description   Final    BLOOD RIGHT HAND Performed at [facility], [address], [place], [place] [address]    Special Requests   Final    BOTTLES DRAWN AEROBIC AND ANAEROBIC Blood Culture adequate volume Performed at [facility], [address], [place], [place] [address]    Culture   Final    NO GROWTH < 24 HOURS Performed at [facility], [address], [place], [place] [address]    Report Status PENDING  Incomplete  Culture, blood (Routine X 2) w Reflex to ID Panel     Status: None (Preliminary result)   Collection Time: 07/02/24  4:54 AM   Specimen: BLOOD RIGHT HAND  Result Value Ref Range Status   Specimen Description   Final    BLOOD RIGHT HAND Performed at [facility], [address], [place], [place] [address]    Special Requests   Final    BOTTLES DRAWN AEROBIC AND ANAEROBIC Blood Culture adequate volume Performed at [facility], [address] [place], [place] [address]    Culture   Final    NO GROWTH < 24 HOURS Performed at [facility], [address] [place], [place] [address]    Report Status PENDING  Incomplete     Labs: Basic Metabolic Panel: Recent Labs  Lab 06/30/24 1424 07/01/24 0441 07/02/24 0440 07/03/24 0515  NA 140 137 139 141  K 2.9* 2.8* 2.9* 2.9*  CL 100 99 99 97*  CO2 33* 28 30 30   GLUCOSE 109* 113* 98 87  BUN 16 19 20  25*  CREATININE 1.04 1.21 1.30* 1.51*  CALCIUM  8.5* 8.1* 7.8* 8.1*  MG 1.8 2.3 2.0 2.0  PHOS  --  2.6 2.3* 2.8   Liver Function  Tests: Recent Labs  Lab 06/30/24 1424 07/01/24 0441 07/02/24 0440 07/03/24 0515  AST 45* 41 35  --   ALT 57* 49* 38  --   ALKPHOS 105 83 86  --   BILITOT 1.1 1.1 1.0  --   PROT 5.7* 5.3* 5.6*  --   ALBUMIN  3.0* 2.1* 2.1* 1.9*   No results for input(s): LIPASE, AMYLASE in the last  168 hours. No results for input(s): AMMONIA in the last 168 hours. CBC: Recent Labs  Lab 06/30/24 1424 07/01/24 0441 07/01/24 2310 07/02/24 0440 07/03/24 0515  WBC 0.2* 0.2*  --  0.4* 1.7*  NEUTROABS 0.0* 0.0*  --  0.0* 0.9*  HGB 8.4* 7.1* 9.3* 9.2* 9.0*  HCT 24.8* 22.3* 29.1* 28.5* 28.4*  MCV 89.5 93.3  --  90.5 90.2  PLT 67* 57*  --  74* 69*   Cardiac Enzymes: No results for input(s): CKTOTAL, CKMB, CKMBINDEX, TROPONINI in the last 168 hours. BNP: BNP (last 3 results) Recent Labs    01/25/24 1501 06/05/24 1320 07/02/24 0440  BNP 131.3* 79.5 650.5*    ProBNP (last 3 results) No results for input(s): PROBNP in the last 8760 hours.  CBG: Recent Labs  Lab 07/02/24 1635 07/02/24 2128 07/02/24 2236 07/03/24 0741 07/03/24 1119  GLUCAP 100* 81 91 78 96       Signed:  Toribio Hummer MD.  [facility] 07/03/2024, 3:43 PM

## 2024-07-03 NOTE — Progress Notes (Signed)
 ID PROGRESS NOTE   Patient is being discharged with hospice. Family wishes to finish out course of treatment for polymicrobial bacteremia (enterococcus faecalis and e.coli)  Recommendation:  10 days of amox/clav 875mg  bid and 5 days of levofloxacin  500mg  daily

## 2024-07-03 NOTE — Progress Notes (Signed)
 PHARMACY NOTE:  ANTIMICROBIAL RENAL DOSAGE ADJUSTMENT  Current antimicrobial regimen includes a mismatch between antimicrobial dosage and estimated renal function.  As per policy approved by the Pharmacy & Therapeutics and Medical Executive Committees, the antimicrobial dosage will be adjusted accordingly.  Current antimicrobial dosage:  Ampicillin  2g IV q4h  Indication: E. Faecalis bacteremia  Renal Function:  Estimated Creatinine Clearance: 40.7 mL/min (A) (by C-G formula based on SCr of 1.51 mg/dL (H)). []      On intermittent HD, scheduled: []      On CRRT    Antimicrobial dosage has been changed to:   Ampicillin  2g IV q6h   Thank you for allowing pharmacy to be a part of this patient's care.  Wanda Hasting PharmD, BCPS [facility] main pharmacy [phone] 07/03/2024 9:22 AM

## 2024-07-03 NOTE — TOC Transition Note (Addendum)
 Transition of Care [facility]) - Discharge Note   Patient Details  Name: Joshua Compton. MRN: [id number] Date of Birth: 13-Nov-1941  Transition of Care [facility]) CM/SW Contact:  Sonda Manuella Quill, RN Phone Number: 07/03/2024, 4:52 PM   Clinical Narrative:    D/C orders received; pt home w/ [facility]; d/c address previously confirmed; [facility] called for transport at 1648; spoke w/ operator # [phone]; Zoe, RN notified; Lynwood at [place] notified; he will have on call nurse return call; also notified pt does not have nebulizer at home; awaiting return call.  -[phone]- return call from Consuelo, on call RN for [facility] notified of pt's d/c and need for home nebulizer; Dr Sebastian ODESSIA Banks, RN notified neb will not be delivered before late morning tomorrow; [facility] rep to notify dtr [name]; dose of pm nebulized meds before pt leaves hospital; pt will need to have PM dose of nebulized meds before he leaves hospital; neb will not be delivered before late morning tomorrow; Consuelo will notify dtr Natalie, and give her agency on call # [phone]; no TOC needs.  Final next level of care: Home w Hospice Care Barriers to Discharge: No Barriers Identified   Patient Goals and CMS Choice Patient states their goals for this hospitalization and ongoing recovery are:: home with hospice CMS Medicare.gov Compare Post Acute Care list provided to:: Patient Represenative (must comment)   [place] ownership interest in [facility].provided to:: Adult Children    Discharge Placement                       Discharge Plan and Services Additional resources added to the After Visit Summary for     Discharge Planning Services: CM Consult                                 Social Drivers of Health (SDOH) Interventions SDOH Screenings   Food Insecurity: No Food Insecurity (07/03/2024)  Housing: Low Risk  (07/03/2024)  Transportation Needs: No Transportation Needs (07/03/2024)  Utilities: Not At  Risk (07/03/2024)  Alcohol Screen: Low Risk  (01/14/2023)  Depression (PHQ2-9): Low Risk  (04/12/2024)  Financial Resource Strain: Low Risk  (05/26/2024)  Physical Activity: Inactive (05/26/2024)  Social Connections: Socially Integrated (06/30/2024)  Recent Concern: Social Connections - Moderately Isolated (05/26/2024)  Stress: No Stress Concern Present (05/26/2024)  Tobacco Use: Medium Risk (06/30/2024)     Readmission Risk Interventions    07/03/2024    8:55 AM 09/06/2023   10:18 AM  Readmission Risk Prevention Plan  Transportation Screening Complete Complete  Home Care Screening  Complete  Medication Review (RN CM)  Complete  Medication Review (RN Care Manager) Complete   PCP or Specialist appointment within 3-5 days of discharge Complete   HRI or Home Care Consult Complete   SW Recovery Care/Counseling Consult Complete   Palliative Care Screening Complete   Skilled Nursing Facility Complete

## 2024-07-03 NOTE — TOC Initial Note (Addendum)
 Transition of Care (TOC) - Initial/Assessment Note    Patient Details  Name: Joshua Compton. MRN: [id number] Date of Birth: 1941-05-22  Transition of Care [facility]) CM/SW Contact:    Sonda Manuella Quill, RN Phone Number: 07/03/2024, 8:58 AM  Clinical Narrative:                 TOC for d/c planning; spoke w/ pt, dtr Joshua Compton [phone]), and family in room; PT recc SNF; pt and family seen by [facility]; they have opted for home w/ hospice; agency choice [facility]; transport by [facility]; insurance/PCP verified; pt denied experiencing SDOH risks; pt has cane, and walker; he has home oxygen  from [place]; family says they have already been contacted by [facility], and agency has delivered hospital bed, suction, and oxygen  to home; family requested wheelchair, and [facility]; called agency and spoke w/ Garrel; he will have on-call nurse call this RN, CM; awaiting return call; confirmed d/c address w/ family: [address] [place], [place] [address]; agency contact info placed in follow up provider section of d/c instructions.  -[phone] Miller, RN returned call; she says intake team has seen pt; agency will order [facility] and wheelchair; family notified; TOC is following.  -[id number]- notified by family pt has not received previously ordered portable tanks from [place]; spoke w/ Lorn at agency; she confirmed 3 C-tanks were delivered to home yesterday, and hospice does not provide portable concentrator; family notified and verbalized understanding.   Expected Discharge Plan: Home w Hospice Care Barriers to Discharge: Continued Medical Work up   Patient Goals and CMS Choice Patient states their goals for this hospitalization and ongoing recovery are:: home with hospice CMS Medicare.gov Compare Post Acute Care list provided to:: Patient Represenative (must comment)   [place] ownership interest in [facility].provided to:: Adult Children    Expected Discharge Plan and Services    Discharge Planning Services: CM Consult   Living arrangements for the past 2 months: Single Family Home                                      Prior Living Arrangements/Services Living arrangements for the past 2 months: Single Family Home Lives with:: Spouse Patient language and need for interpreter reviewed:: Yes Do you feel safe going back to the place where you live?: Yes      Need for Family Participation in Patient Care: Yes (Comment) Care giver support system in place?: Yes (comment) Current home services: DME (walker, cane, hospital bed, home oxygen  from [place]) Criminal Activity/Legal Involvement Pertinent to Current Situation/Hospitalization: No - Comment as needed  Activities of Daily Living   ADL Screening (condition at time of admission) Independently performs ADLs?: No Does the patient have a NEW difficulty with bathing/dressing/toileting/self-feeding that is expected to last >3 days?: Yes (Initiates electronic notice to provider for possible OT consult) Does the patient have a NEW difficulty with getting in/out of bed, walking, or climbing stairs that is expected to last >3 days?: Yes (Initiates electronic notice to provider for possible PT consult) Does the patient have a NEW difficulty with communication that is expected to last >3 days?: No Is the patient deaf or have difficulty hearing?: No Does the patient have difficulty seeing, even when wearing glasses/contacts?: No Does the patient have difficulty concentrating, remembering, or making decisions?: No  Permission Sought/Granted Permission sought to share information with : Case Manager  Permission granted to share information with : Yes, Verbal Permission Granted  Share Information with NAME: Case Manager     Permission granted to share info w Relationship: Joshua Compton (dtr) [phone]     Emotional Assessment Appearance:: Appears stated age Attitude/Demeanor/Rapport:  Gracious Affect (typically observed): Accepting Orientation: : Oriented to Self, Oriented to Place, Oriented to  Time, Oriented to Situation Alcohol / Substance Use: Not Applicable Psych Involvement: No (comment)  Admission diagnosis:  Neutropenic fever (HCC) [D70.9, R50.81] Patient Active Problem List   Diagnosis Date Noted   Bacteremia 07/01/2024   Pressure injury of skin 07/01/2024   CAP (community acquired pneumonia) 07/01/2024   Hemoptysis 07/01/2024   Diarrhea 07/01/2024   Palliative care encounter 07/01/2024   Goals of care, counseling/discussion 07/01/2024   DNR (do not resuscitate) 07/01/2024   Counseling and coordination of care 07/01/2024   Neutropenic fever (HCC) 06/30/2024   Pancytopenia (HCC) 06/30/2024   Port-A-Cath in place 06/21/2024   History of CVA (cerebrovascular accident) 06/11/2024   Chronic diastolic CHF (congestive heart failure) (HCC) 06/11/2024   Type 2 diabetes mellitus with hyperlipidemia (HCC) 06/11/2024   Elevated troponin 06/10/2024   Acute on chronic combined systolic and diastolic CHF (congestive heart failure) (HCC) 06/10/2024   NSTEMI (non-ST elevated myocardial infarction) (HCC) 06/09/2024   Adenocarcinoma of unknown primary (HCC) 06/09/2024   Pain from bone metastases (HCC) 05/26/2024   Cancer, metastatic to bone (HCC) 05/26/2024   Venous insufficiency of both lower extremities 02/01/2024   Chronic kidney disease, stage 3a (HCC) 02/01/2024   Centrilobular emphysema (HCC) 12/22/2023   Vitamin D  deficiency 09/18/2023   PAF (paroxysmal atrial fibrillation) (HCC) 09/07/2023   Cerebrovascular accident (CVA) due to embolism of left middle cerebral artery (HCC) 08/10/2023   Paresthesia of both feet 07/28/2023   Gastroesophageal reflux disease with esophagitis without hemorrhage 09/10/2021   Primary insomnia 09/10/2021   Vasculogenic erectile dysfunction 09/10/2021   Status post total replacement of left hip 01/07/2021   Primary osteoarthritis of  left hip 01/06/2021   Leg cramps 12/11/2020   Hyperlipidemia    Severe stage glaucoma 03/26/2012   Nuclear sclerosis 03/26/2012   Degenerative disc disease 02/04/2011   Anemia 02/04/2011   History of prostate cancer 02/04/2011   PCP:  [name], MD Pharmacy:   [facility] [phone] - [place], [place] - [address] [place] [address] Phone: [phone] Fax: [phone]     Social Drivers of Health (SDOH) Social History: SDOH Screenings   Food Insecurity: No Food Insecurity (07/03/2024)  Housing: Low Risk  (07/03/2024)  Transportation Needs: No Transportation Needs (07/03/2024)  Utilities: Not At Risk (07/03/2024)  Alcohol Screen: Low Risk  (01/14/2023)  Depression (PHQ2-9): Low Risk  (04/12/2024)  Financial Resource Strain: Low Risk  (05/26/2024)  Physical Activity: Inactive (05/26/2024)  Social Connections: Socially Integrated (06/30/2024)  Recent Concern: Social Connections - Moderately Isolated (05/26/2024)  Stress: No Stress Concern Present (05/26/2024)  Tobacco Use: Medium Risk (06/30/2024)   SDOH Interventions: Food Insecurity Interventions: Intervention Not Indicated, Inpatient TOC Housing Interventions: Intervention Not Indicated, Inpatient TOC Transportation Interventions: Intervention Not Indicated, Inpatient TOC Utilities Interventions: Intervention Not Indicated, Inpatient TOC   Readmission Risk Interventions    07/03/2024    8:55 AM 09/06/2023   10:18 AM  Readmission Risk Prevention Plan  Transportation Screening Complete Complete  Home Care Screening  Complete  Medication Review (RN CM)  Complete  Medication Review (RN Care Manager) Complete   PCP or Specialist appointment within 3-5 days  of discharge Complete   HRI or Home Care Consult Complete   SW Recovery Care/Counseling Consult Complete   Palliative Care Screening Complete   Skilled Nursing Facility Complete

## 2024-07-03 NOTE — Progress Notes (Signed)
 Daily Progress Note   Patient Name: Joshua Compton.       Date: 07/03/2024 DOB: Jun 14, 1941  Age: 83 y.o. MRN#: [id number] Attending Physician: Sebastian Toribio GAILS, MD Primary Care Physician: [name], MD Admit Date: 06/30/2024 Length of Stay: 3 days  Reason for Consultation/Follow-up: Establishing goals of care  Subjective:   Reviewed EMR including recent documentation from hospitalist, TOC, and ID.  Presented to bedside to meet with patient.  Patient's 4 daughters present at bedside.  Able to follow-up on care plan today.  Daughters noted that [facility] has set up equipment at their home.  They are hopeful patient can go home today with hospice support.  They noted that [facility] told them to call once patient was at home so they could admit him to their services today.  Daughter stated they did have patient's pain medication at home for him to continue receiving this.  Daughter, [name], was inquiring about FMLA paperwork to allow her time at home to care for patient.  Was able to assist with coordination of completion.  Spent time answering questions as able.  Noted palliative medicine team will continue to follow with patient's medical journey. . Discussed care with team including hospitalist, RN, and TOC throughout the day.  Patient had issues with hypotension so receiving fluid bolus and albumin .  Patient has all equipment at home for hospice and had previously expressed wishes to go home.  Defer to hospitalist and TOC to coordinate discharge planning.  Objective:   Vital Signs:  BP (!) 105/31   Pulse 74   Temp 98.6 F (37 C) (Oral)   Resp (!) 21   Ht 6' (1.829 m)   Wt 85.6 kg   SpO2 90%   BMI 25.59 kg/m   Physical Exam: General: Awake at times though easily falls asleep, ill-appearing, frail Cardiovascular: RRR Respiratory: No increased work of breathing noted, not in respiratory distress Abdomen: not distended Neuro: Awake at times though easily falls  asleep during conversation  Assessment & Plan:   Assessment: Patient is an 83 year old male with a past medical history of paroxysmal A-fib on chronic anticoagulation with Eliquis , history of CVA, HFpEF, CAD with recent NSTEMI 05/2024 status post DES to left circumflex on dual antiplatelet therapy of Eliquis  and Plavix , 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  Since admission patient is receiving management for neutropenic fever with noted bacteremia secondary to E. coli, Enterococcus bacillus, and Klebsiella; hemoptysis; pancytopenia; electrolyte abnormalities; metastatic cancer; stage II wound on left and right buttocks.  ID and oncology consulted for recommendations.  Palliative medicine team consulted to assist with complex medical decision making. Of note patient seen by PMT during hospitalization in July 2025.  Recommendations/Plan: # Complex medical decision making/goals of care:  - Discussed care at patient's bedside with 4 daughters at bedside as described in HPI.  Plan is for patient to go home with hospice through [facility].  TOC assisting with discharge coordination as patient will need Jeralyn [facility] transport.  Family noted to have equipment at home for hospice.  Palliative medicine team continuing to follow along with patient's medical journey.  -  Code Status: Limited: Do not attempt resuscitation (DNR) -DNR-LIMITED -Do Not Intubate/DNI   # Psychosocial Support:  - 4  daughters, granddaughter  # Discharge Planning: Home with Hospice  - Home with [facility] hospice  Discussed with: Patient, patient's 4 daughters at bedside and granddaughter, [facility], hospitalist, RN  Thank you for allowing the palliative care team to  participate in the care [name].SABRA Tinnie Radar, DO Palliative Care Provider PMT # [phone]  If patient remains symptomatic despite maximum doses, please call PMT at [phone] between 0700 and 1900. Outside of these hours, please call attending, as PMT does not have night coverage.  Personally spent 35 minutes in patient care including extensive chart review (labs, imaging, progress/consult notes, vital signs), medically appropraite exam, discussed with treatment team, education to patient, family, and staff, documenting clinical information, medication review and management, coordination of care, and available advanced directive documents.

## 2024-07-03 NOTE — Progress Notes (Signed)
 PROGRESS NOTE    Joshua Compton.  FMW:[id number] DOB: 1941/08/15 DOA: 06/30/2024 PCP: [name], MD    No chief complaint on file.   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  Patient admitted, pancultured.  Patient noted with a bacteremia and concern for pneumonia.  Patient on empiric IV antibiotics.  ID consulted.  Oncology consulted.  Palliative consulted   Assessment & Plan:   Principal Problem:   Neutropenic fever (HCC) Active Problems:   Bacteremia   NSTEMI (non-ST elevated myocardial infarction) (HCC)   PAF (paroxysmal atrial fibrillation) (HCC)   Chronic kidney disease, stage 3a (HCC)   Chronic diastolic CHF (congestive heart failure) (HCC)   Centrilobular emphysema (HCC)   Adenocarcinoma of unknown primary (HCC)   History of CVA (cerebrovascular accident)   Gastroesophageal reflux disease with esophagitis without hemorrhage   Type 2 diabetes mellitus with hyperlipidemia (HCC)   Anemia   Cerebrovascular accident (CVA) due to embolism of left middle cerebral artery (HCC)   Venous insufficiency of both lower extremities   Cancer, metastatic to bone (HCC)   Acute on chronic combined systolic and diastolic CHF (congestive heart failure) (HCC)   Pancytopenia (HCC)   Pressure injury of skin   CAP (community acquired pneumonia)   Hemoptysis   Diarrhea   Palliative care encounter   Goals of care, counseling/discussion   DNR (do not resuscitate)   Counseling and  coordination of care   Hypotension  #1 neutropenic fever/?CAP/bacteremia -Patient presenting with drowsiness, fevers x 3 days, cough, shortness of breath and noted with a severe neutropenia with white count of 0.2 and ANC of 0.0. -Labs today with a WBC of 0.4 and ANC of 0.0. - Chest x-ray done with bibasilar patchy opacities which may represent atelectasis or pneumonia. - Patient with some lower extremity wounds with some redness and concern for possible cellulitis. - Urinalysis nitrite negative leukocyte negative. - Blood cultures obtained with 2/2 bottles with BCID growing Enterococcus faecalis, E. coli, Klebsiella aerogenes - Chest x-ray, 07/01/2024, with progressive right greater than left interstitial and airspace opacities suspicious for pneumonia superimposed upon sequela of smoking/chronic bronchitis.  -Procalcitonin noted at 1.11.   - Urine cultures pending.  - Urine strep pneumococcus antigen negative.  - Urine Legionella antigen pending.  - Stool studies negative for C. difficile, GI pathogen panel negative..  - Respiratory viral panel negative. - SARS coronavirus 2 by PCR negative, influenza A and B by PCR negative, RSV by PCR negative. - Patient seen in consultation by oncology who stated patient had G-CSF after last chemotherapy treatment recently and did not need any further G-CSF.   - Discontinued G-CSF per oncology recommendations per -Discontinued vancomycin . -Continue IV cefepime  and IV ampicillin  while in-house. -Palliative care met with family and decision made to go home with hospice. -Family would like antibiotics on discharge and per ID patient can be transition to Augmentin  875 mg twice daily x 10 days and Levaquin  500 milligrams daily x 5 days. - ID following. -Palliative care following. -Supportive care.  2.  Bacteremia secondary to E.  Coli, Enterococcus faecalis, Klebsiella aerogenes.  -??  Etiology. -Urine cultures pending. -Patient was on IV vancomycin  and  IV cefepime  on admission. -Discontinued IV vancomycin . -Continue IV cefepime  and IV ampicillin  per ID. - 2D echo done with no vegetations noted.   - Patient being followed by palliative care and decision made to go home with hospice.   - Family would like continued antibiotics on discharge.   - ID recommending Augmentin  875 twice daily x 10 days and Levaquin  500 mg x 5 days on discharge.  - ID following and appreciate their input and recommendations..  3.  Hemoptysis versus GI bleed -??  Etiology -Chest x-ray concerning for community-acquired pneumonia in the immunocompromised patient presented with neutropenic fevers in the setting of thrombocytopenia. -Patient noted to be on dual antiplatelet therapy of Eliquis  and Plavix  prior to admission secondary to recent non-STEMI status post DES. -Spoke with patient's primary cardiologist on admission and recommended holding Eliquis  with continuation of Plavix  due to recent stent placement. - Hemoglobin noted at 7.1 on 07/01/2024, status post transfusion 2 units PRBCs with hemoglobin currently at 9.0. -Hemoptysis improved. -FOBT positive -Continue empiric IV antibiotics. - Family met with palliative care and patient being transitioned to home with hospice on discharge.   4.  Pancytopenia -Secondary to recent chemotherapy. - Patient being pancultured secondary to problem #1. - Patient noted to have received G-CSF per family on Monday, 06/27/2024. - Patient noted to have started chemotherapy on 06/24/2024 and per daughters has completed 9 radiation treatments. - Patient with a bacteremia with blood cultures growing Enterococcus faecalis, E. coli, Klebsiella erogenous.  -Hemoglobin noted at 7.1(07/01/2024). -Anemia panel with iron level of 16, TIBC of 139, ferritin of 464, folate of 11.3, vitamin B12 of 343. - Status post transfusion 2 units PRBCs with hemoglobin currently at 9.0 this morning.  -Platelet count noted at 57K on 07/01/2024,, patient with  hemoptysis and as such patient transfuse 1 unit platelets with platelet count currently at 69 K.  -FOBT positive - Follow H&H and transfuse for hemoglobin < 8. -Due to anemia, positive FOBT, hemoptysis versus??  GI patient was seen in consultation by GI and has patient and family leaning more towards home with hospice per GI no role for endoscopic evaluation at this time. - Patient's primary oncologist is aware of patient's admission and will be following patient during the hospitalization. - Supportive care.  5.  Anemia -Patient presentation with a pancytopenia felt likely secondary to recent chemotherapy. -Hemoglobin noted to drop as low as 7.1 (07/01/2024) and patient transfused 2 units PRBCs with hemoglobin currently at 9.0 this morning. -Anemia panel with iron level of 16, TIBC of 139, ferritin of 464, folate of 11.3, vitamin B12 of 343. -FOBT noted to be positive. -Patient with hemoptysis versus GI bleed -Continue IV PPI every 12 hours. - Currently tolerating regular diet.   - Patient assessed by GI and asked patient and family met with palliative care and decision made to go home with hospice per GI no role for endoscopic evaluation needed at this time.  - Follow H&H. -Transfusion threshold hemoglobin < 8 - Supportive care.    6.  Metastatic adenocarcinoma involving the bones and muscle, primary unknown -Patient noted to have recently started chemotherapy on 06/24/2024. - Per daughters at bedside patient has already completed 9 radiation treatments. -Per daughter his next chemotherapy treatment is on 07/15/2024. -Patient now with hemoptysis, bacteremia with 3 separate organisms, community-acquired pneumonia, neutropenic fevers, pancytopenia with worsening platelets count. -Patient with poor  prognosis. -Oncology informed of admission and have assessed and following.   - Palliative care consulted and following and per palliative care and discussion with family they are focusing more on  comfort measures and would like patient to be discharged home with hospice..   - Per oncology.    7.  Hypokalemia -Magnesium  noted at 2.0. - Potassium at 2.9. - KCl 10 mEq IV every hour x 5 runs - Repeat labs in the AM.   8.  Paroxysmal atrial fibrillation - Currently in A-fib, but rate controlled.   -Continue home regimen amiodarone  and Toprol -XL for rate control. - Patient with a thrombocytopenia with platelet count of 69 K after transfusion of 1 unit platelets.. -Patient with improving hemoptysis. -Continue to hold Eliquis . - Discussed with patient's primary cardiologist.   9.  CAD/recent non-STEMI with PCI -Patient noted to have been on triple therapy x 1 week, aspirin  discontinued and patient recommended to continue on Eliquis  and Plavix . - Due to thrombocytopenia Eliquis  held and will continue to hold as patient now with hemoptysis.   - Continue Plavix  and monitor platelet count.   - Discussed with patient's primary cardiologist.    10.  COPD/chronic respiratory failure on 2 L nasal cannula -Stable. - Discontinued Breztri . - Continue Pulmicort  twice daily, scheduled Xopenex  and Atrovent  nebs.    11.  Acute on chronic combined systolic and diastolic heart failure/chronic HFimpEF - Patient noted to have been started back on torsemide  20 mg daily on day of admission per his cardiologist note as patient was seen by cardiologist this morning. -BNP noted elevated at 650.5. -Patient status post transfusion 2 units PRBCs and 1 unit platelets. - Continue home regimen Toprol -XL, Entresto . -Discontinued torsemide . - Status post Lasix  20 mg IV every 12 hours x 2 doses.   - Patient clinically improved likely close to euvolemia.   - Patient with a bump in creatinine.   - Discontinue IV antibiotics.   - Could likely resume home regimen torsemide  in the next 24 to 48 hours.  -Strict I's and O's. -Daily weights.   12.  Bilateral lower extremity wounds/?  Cellulitis -Patient with  worsening wounds on lower extremities with some erythema, some warmth and some tenderness to palpation. - Concern for possible cellulitis in the setting of chronic venous stasis changes. - Wound care RN consulted.   - On empiric IV antibiotics.     13.  GERD - Continue IV PPI.     14.  History of CVA -Felt likely secondary to paroxysmal atrial fibrillation. - Continue statin. - Eliquis  on hold due to thrombocytopenia and hemoptysis.  15.  Diarrhea -Patient with multiple watery loose stools. - Laxatives discontinued. - C. difficile PCR negative.   - GI pathogen panel negative.   - Improved with Imodium .   - Continue Imodium  as needed.  16.  Pressure injury left buttock stage II/right buttock stage II, POA/lower extremity vascular ulcers.POA - WOC RN consulted and assessed patient and recommendations made..  17.  Hypotension -Patient noted to be hypotensive later on this morning with BP of 69/36. - Likely multifactorial secondary to bacteremia in the setting of decreased oral intake.  Patient also noted to have received 2 doses of IV Lasix . - 1 L normal saline bolus given. - IV albumin  x 1. - Follow.    DVT prophylaxis: SCDs Code Status: DNR Family Communication: Updated patient, 4 daughters at bedside. Disposition: Home with hospice once medically stable hopefully in the next 24 to 48 hours.  Status is: Inpatient Remains inpatient appropriate because: Severity of illness   Consultants:  ID: Dr.Manandhar 07/01/2024 Oncology: Dr. Federico 07/01/2024 GI: Dr. Stacia 07/02/2024  Procedures:  Chest x-ray 06/30/2024, 07/01/2024 Transfused 2 units PRBC 07/01/2024 Transfuse 1 unit platelet 07/01/2024 2D echo 07/02/2024  Antimicrobials:  Anti-infectives (From admission, onward)    Start     Dose/Rate Route Frequency Ordered Stop   07/03/24 1200  ampicillin  (OMNIPEN) 2 g in sodium chloride  0.9 % 100 mL IVPB        2 g 300 mL/hr over 20 Minutes Intravenous Every 6 hours  07/03/24 0923     07/01/24 2000  vancomycin  (VANCOREADY) IVPB 1500 mg/300 mL  Status:  Discontinued        1,500 mg 150 mL/hr over 120 Minutes Intravenous Every 24 hours 06/30/24 1920 07/01/24 1035   07/01/24 1600  ampicillin  (OMNIPEN) 2 g in sodium chloride  0.9 % 100 mL IVPB  Status:  Discontinued        2 g 300 mL/hr over 20 Minutes Intravenous Every 4 hours 07/01/24 1022 07/03/24 0923   06/30/24 2000  vancomycin  (VANCOREADY) IVPB 1750 mg/350 mL        1,750 mg 175 mL/hr over 120 Minutes Intravenous  Once 06/30/24 1920 06/30/24 2344   06/30/24 1930  ceFEPIme  (MAXIPIME ) 2 g in sodium chloride  0.9 % 100 mL IVPB        2 g 200 mL/hr over 30 Minutes Intravenous Every 12 hours 06/30/24 1919           Subjective: Patient lying in bed.  More alert today.  States he feels well.  Denies any chest pain no significant shortness of breath.  No abdominal pain.  On 4 L nasal cannula.  Hemoptysis improving.  Daughter is at bedside.   Objective: Vitals:   07/03/24 1330 07/03/24 1350 07/03/24 1400 07/03/24 1410  BP: (!) 84/44 (!) 92/38 (!) 95/34 (!) 102/36  Pulse: 74 82 73 73  Resp: 19 16 13  (!) 21  Temp:      TempSrc:      SpO2: 95% 95% 96% 96%  Weight:      Height:        Intake/Output Summary (Last 24 hours) at 07/03/2024 1420 Last data filed at 07/03/2024 1411 Gross per 24 hour  Intake 2799.15 ml  Output 2700 ml  Net 99.15 ml   Filed Weights   06/30/24 1807  Weight: 85.6 kg    Examination:  General exam: NAD. Respiratory system: Decreased coarse breath sounds on the right.  No wheezing.  Fair air movement.  Speaking in full sentences.  No use of accessory muscles of respiration.  Cardiovascular system: Irregularly irregular.  No JVD, no murmurs rubs or gallops.  No significant lower extremity edema.  Gastrointestinal system: Abdomen is nondistended, soft and nontender. No organomegaly or masses felt. Normal bowel sounds heard. Central nervous system: Alert and oriented.  Moving  extremities spontaneously.  No focal neurological deficits. Extremities: Bilateral upper extremity swelling improved. Skin: No rashes, lesions or ulcers Psychiatry: Judgement and insight appear fair. Mood & affect appropriate.     Data Reviewed: I have personally reviewed following labs and imaging studies  CBC: Recent Labs  Lab 06/30/24 1424 07/01/24 0441 07/01/24 2310 07/02/24 0440 07/03/24 0515  WBC 0.2* 0.2*  --  0.4* 1.7*  NEUTROABS 0.0* 0.0*  --  0.0* 0.9*  HGB 8.4* 7.1* 9.3* 9.2* 9.0*  HCT 24.8* 22.3* 29.1* 28.5* 28.4*  MCV 89.5 93.3  --  90.5 90.2  PLT 67* 57*  --  74* 69*    Basic Metabolic Panel: Recent Labs  Lab 06/30/24 1424 07/01/24 0441 07/02/24 0440 07/03/24 0515  NA 140 137 139 141  K 2.9* 2.8* 2.9* 2.9*  CL 100 99 99 97*  CO2 33* 28 30 30   GLUCOSE 109* 113* 98 87  BUN 16 19 20  25*  CREATININE 1.04 1.21 1.30* 1.51*  CALCIUM  8.5* 8.1* 7.8* 8.1*  MG 1.8 2.3 2.0 2.0  PHOS  --  2.6 2.3* 2.8    GFR: Estimated Creatinine Clearance: 40.7 mL/min (A) (by C-G formula based on SCr of 1.51 mg/dL (H)).  Liver Function Tests: Recent Labs  Lab 06/30/24 1424 07/01/24 0441 07/02/24 0440 07/03/24 0515  AST 45* 41 35  --   ALT 57* 49* 38  --   ALKPHOS 105 83 86  --   BILITOT 1.1 1.1 1.0  --   PROT 5.7* 5.3* 5.6*  --   ALBUMIN  3.0* 2.1* 2.1* 1.9*    CBG: Recent Labs  Lab 07/02/24 1635 07/02/24 2128 07/02/24 2236 07/03/24 0741 07/03/24 1119  GLUCAP 100* 81 91 78 96     Recent Results (from the past 240 hours)  Urine Culture     Status: Abnormal   Collection Time: 06/30/24  2:02 PM   Specimen: Urine, Clean Catch  Result Value Ref Range Status   Specimen Description   Final    URINE, CLEAN CATCH Performed at [facility], [address], [place], [place] [address]    Special Requests   Final    NONE Performed at [facility], [address], [place], [place] [address]    Culture (A)   Final    >=100,000 COLONIES/mL ENTEROBACTER CLOACAE 80,000 COLONIES/mL ENTEROCOCCUS FAECALIS    Report Status 07/03/2024 FINAL  Final   Organism ID, Bacteria ENTEROBACTER CLOACAE (A)  Final   Organism ID, Bacteria ENTEROCOCCUS FAECALIS (A)  Final      Susceptibility   Enterobacter cloacae - MIC*    CEFEPIME  <=0.12 SENSITIVE Sensitive     ERTAPENEM <=0.12 SENSITIVE Sensitive     CIPROFLOXACIN <=0.06 SENSITIVE Sensitive     GENTAMICIN <=1 SENSITIVE Sensitive     NITROFURANTOIN 64 INTERMEDIATE Intermediate     TRIMETH/SULFA <=20 SENSITIVE Sensitive     PIP/TAZO Value in next row Sensitive ug/mL     <=4 SENSITIVEThis is a modified FDA-approved test that has been validated and its performance characteristics determined by the reporting laboratory.  This laboratory is certified under the Clinical Laboratory Improvement Amendments CLIA as qualified to perform high complexity clinical laboratory testing.    MEROPENEM Value in next row Sensitive      <=4 SENSITIVEThis is a modified FDA-approved test that has been validated and its performance characteristics determined by the reporting laboratory.  This laboratory is certified under the Clinical Laboratory Improvement Amendments CLIA as qualified to perform high complexity clinical laboratory testing.    * >=100,000 COLONIES/mL ENTEROBACTER CLOACAE   Enterococcus faecalis - MIC*    AMPICILLIN  Value in next row Sensitive      <=4 SENSITIVEThis is a modified FDA-approved test that has been validated and its performance characteristics determined by the reporting laboratory.  This laboratory is certified under the Clinical Laboratory Improvement Amendments CLIA as qualified to perform high complexity clinical laboratory testing.    NITROFURANTOIN Value in next row Sensitive      <=4 SENSITIVEThis is a modified FDA-approved test that  has been validated and its performance characteristics determined by the reporting laboratory.  This laboratory is  certified under the Clinical Laboratory Improvement Amendments CLIA as qualified to perform high complexity clinical laboratory testing.    VANCOMYCIN  Value in next row Sensitive      <=4 SENSITIVEThis is a modified FDA-approved test that has been validated and its performance characteristics determined by the reporting laboratory.  This laboratory is certified under the Clinical Laboratory Improvement Amendments CLIA as qualified to perform high complexity clinical laboratory testing.    * 80,000 COLONIES/mL ENTEROCOCCUS FAECALIS  Culture, blood (Routine X 2) w Reflex to ID Panel     Status: Abnormal (Preliminary result)   Collection Time: 06/30/24  3:18 PM   Specimen: BLOOD  Result Value Ref Range Status   Specimen Description BLOOD LEFT ANTECUBITAL  Final   Special Requests   Final    BOTTLES DRAWN AEROBIC AND ANAEROBIC Blood Culture adequate volume   Culture  Setup Time   Final    GRAM NEGATIVE RODS GRAM POSITIVE COCCI IN PAIRS IN BOTH AEROBIC AND ANAEROBIC BOTTLES CRITICAL RESULT CALLED TO, READ BACK BY AND VERIFIED WITH: PHARMD C.DAVIS AT 942 ON 07/01/2024 BY T.SAAD.    Culture (A)  Final    KLEBSIELLA AEROGENES ESCHERICHIA COLI GRAM POSITIVE COCCI SUSCEPTIBILITIES TO FOLLOW CULTURE REINCUBATED FOR BETTER GROWTH Performed at [facility], [address] [place], [place] [address]    Report Status PENDING  Incomplete  Blood Culture ID Panel (Reflexed)     Status: Abnormal   Collection Time: 06/30/24  3:18 PM  Result Value Ref Range Status   Enterococcus faecalis DETECTED (A) NOT DETECTED Final    Comment: CRITICAL RESULT CALLED TO, READ BACK BY AND VERIFIED WITH: PHARMD C.DAVIS AT 942 ON 07/01/2024 BY T.SAAD.    Enterococcus Faecium NOT DETECTED NOT DETECTED Final   Listeria monocytogenes NOT DETECTED NOT DETECTED Final   Staphylococcus species NOT DETECTED NOT DETECTED Final   Staphylococcus aureus (BCID) NOT DETECTED NOT DETECTED Final   Staphylococcus epidermidis  NOT DETECTED NOT DETECTED Final   Staphylococcus lugdunensis NOT DETECTED NOT DETECTED Final   Streptococcus species NOT DETECTED NOT DETECTED Final   Streptococcus agalactiae NOT DETECTED NOT DETECTED Final   Streptococcus pneumoniae NOT DETECTED NOT DETECTED Final   Streptococcus pyogenes NOT DETECTED NOT DETECTED Final   A.calcoaceticus-baumannii NOT DETECTED NOT DETECTED Final   Bacteroides fragilis NOT DETECTED NOT DETECTED Final   Enterobacterales DETECTED (A) NOT DETECTED Final    Comment: CRITICAL RESULT CALLED TO, READ BACK BY AND VERIFIED WITH: PHARMD C.DAVIS AT 942 ON 07/01/2024 BY T.SAAD.    Enterobacter cloacae complex NOT DETECTED NOT DETECTED Final   Escherichia coli DETECTED (A) NOT DETECTED Final    Comment: CRITICAL RESULT CALLED TO, READ BACK BY AND VERIFIED WITH: PHARMD C.DAVIS AT 942 ON 07/01/2024 BY T.SAAD.    Klebsiella aerogenes DETECTED (A) NOT DETECTED Final    Comment: CRITICAL RESULT CALLED TO, READ BACK BY AND VERIFIED WITH: PHARMD C.DAVIS AT 942 ON 07/01/2024 BY T.SAAD.    Klebsiella oxytoca NOT DETECTED NOT DETECTED Final   Klebsiella pneumoniae NOT DETECTED NOT DETECTED Final   Proteus species NOT DETECTED NOT DETECTED Final   Salmonella species NOT DETECTED NOT DETECTED Final   Serratia marcescens NOT DETECTED NOT DETECTED Final   Haemophilus influenzae NOT DETECTED NOT DETECTED Final   Neisseria meningitidis NOT DETECTED NOT DETECTED Final   Pseudomonas aeruginosa NOT DETECTED NOT DETECTED Final   Stenotrophomonas maltophilia  NOT DETECTED NOT DETECTED Final   Candida albicans NOT DETECTED NOT DETECTED Final   Candida auris NOT DETECTED NOT DETECTED Final   Candida glabrata NOT DETECTED NOT DETECTED Final   Candida krusei NOT DETECTED NOT DETECTED Final   Candida parapsilosis NOT DETECTED NOT DETECTED Final   Candida tropicalis NOT DETECTED NOT DETECTED Final   Cryptococcus neoformans/gattii NOT DETECTED NOT DETECTED Final   CTX-M ESBL NOT  DETECTED NOT DETECTED Final   Carbapenem resistance IMP NOT DETECTED NOT DETECTED Final   Carbapenem resistance KPC NOT DETECTED NOT DETECTED Final   Carbapenem resistance NDM NOT DETECTED NOT DETECTED Final   Carbapenem resist OXA 48 LIKE NOT DETECTED NOT DETECTED Final   Vancomycin  resistance NOT DETECTED NOT DETECTED Final   Carbapenem resistance VIM NOT DETECTED NOT DETECTED Final    Comment: Performed at [facility], [address] [place], [place] [address]  Resp panel by RT-PCR (RSV, Flu A&B, Covid) Anterior Nasal Swab     Status: None   Collection Time: 06/30/24  7:58 PM   Specimen: Anterior Nasal Swab  Result Value Ref Range Status   SARS Coronavirus 2 by RT PCR NEGATIVE NEGATIVE Final    Comment: (NOTE) SARS-CoV-2 target nucleic acids are NOT DETECTED.  The SARS-CoV-2 RNA is generally detectable in upper respiratory specimens during the acute phase of infection. The lowest concentration of SARS-CoV-2 viral copies this assay can detect is 138 copies/mL. A negative result does not preclude SARS-Cov-2 infection and should not be used as the sole basis for treatment or other patient management decisions. A negative result may occur with  improper specimen collection/handling, submission of specimen other than nasopharyngeal swab, presence of viral mutation(s) within the areas targeted by this assay, and inadequate number of viral copies(<138 copies/mL). A negative result must be combined with clinical observations, patient history, and epidemiological information. The expected result is Negative.  Fact Sheet for Patients:  BloggerCourse.com  Fact Sheet for Healthcare Providers:  SeriousBroker.it  This test is no t yet approved or cleared by the [place]  FDA and  has been authorized for detection and/or diagnosis of SARS-CoV-2 by FDA under an Emergency Use Authorization (EUA). This EUA will remain  in effect  (meaning this test can be used) for the duration of the COVID-19 declaration under Section 564(b)(1) of the Act, 21 U.S.C.section 360bbb-3(b)(1), unless the authorization is terminated  or revoked sooner.       Influenza A by PCR NEGATIVE NEGATIVE Final   Influenza B by PCR NEGATIVE NEGATIVE Final    Comment: (NOTE) The Xpert Xpress SARS-CoV-2/FLU/RSV plus assay is intended as an aid in the diagnosis of influenza from Nasopharyngeal swab specimens and should not be used as a sole basis for treatment. Nasal washings and aspirates are unacceptable for Xpert Xpress SARS-CoV-2/FLU/RSV testing.  Fact Sheet for Patients: BloggerCourse.com  Fact Sheet for Healthcare Providers: SeriousBroker.it  This test is not yet approved or cleared by the [place]  FDA and has been authorized for detection and/or diagnosis of SARS-CoV-2 by FDA under an Emergency Use Authorization (EUA). This EUA will remain in effect (meaning this test can be used) for the duration of the COVID-19 declaration under Section 564(b)(1) of the Act, 21 U.S.C. section 360bbb-3(b)(1), unless the authorization is terminated or revoked.     Resp Syncytial Virus by PCR NEGATIVE NEGATIVE Final    Comment: (NOTE) Fact Sheet for Patients: BloggerCourse.com  Fact Sheet for Healthcare Providers: SeriousBroker.it  This test is not yet approved  or cleared by the [place]  FDA and has been authorized for detection and/or diagnosis of SARS-CoV-2 by FDA under an Emergency Use Authorization (EUA). This EUA will remain in effect (meaning this test can be used) for the duration of the COVID-19 declaration under Section 564(b)(1) of the Act, 21 U.S.C. section 360bbb-3(b)(1), unless the authorization is terminated or revoked.  Performed at [facility], [address] [place], [place] [address]    Respiratory (~20 pathogens) panel by PCR     Status: None   Collection Time: 06/30/24  7:58 PM   Specimen: Nasopharyngeal Swab; Respiratory  Result Value Ref Range Status   Adenovirus NOT DETECTED NOT DETECTED Final   Coronavirus 229E NOT DETECTED NOT DETECTED Final    Comment: (NOTE) The Coronavirus on the Respiratory Panel, DOES NOT test for the novel  Coronavirus (2019 nCoV)    Coronavirus HKU1 NOT DETECTED NOT DETECTED Final   Coronavirus NL63 NOT DETECTED NOT DETECTED Final   Coronavirus OC43 NOT DETECTED NOT DETECTED Final   Metapneumovirus NOT DETECTED NOT DETECTED Final   Rhinovirus / Enterovirus NOT DETECTED NOT DETECTED Final   Influenza A NOT DETECTED NOT DETECTED Final   Influenza B NOT DETECTED NOT DETECTED Final   Parainfluenza Virus 1 NOT DETECTED NOT DETECTED Final   Parainfluenza Virus 2 NOT DETECTED NOT DETECTED Final   Parainfluenza Virus 3 NOT DETECTED NOT DETECTED Final   Parainfluenza Virus 4 NOT DETECTED NOT DETECTED Final   Respiratory Syncytial Virus NOT DETECTED NOT DETECTED Final   Bordetella pertussis NOT DETECTED NOT DETECTED Final   Bordetella Parapertussis NOT DETECTED NOT DETECTED Final   Chlamydophila pneumoniae NOT DETECTED NOT DETECTED Final   Mycoplasma pneumoniae NOT DETECTED NOT DETECTED Final    Comment: Performed at [facility], [address], [place], [place] [address]  Culture, blood (routine x 2) Call MD if unable to obtain prior to antibiotics being given     Status: Abnormal (Preliminary result)   Collection Time: 06/30/24  9:01 PM   Specimen: BLOOD  Result Value Ref Range Status   Specimen Description   Final    BLOOD BLOOD LEFT ARM Performed at [facility], [address] [place], [place] [address]    Special Requests   Final    BOTTLES DRAWN AEROBIC ONLY Blood Culture results may not be optimal due to an inadequate volume of blood received in culture bottles Performed at [facility],  [address] [place], [place] [address]    Culture  Setup Time   Final    GRAM POSITIVE COCCI GRAM NEGATIVE RODS AEROBIC BOTTLE ONLY CRITICAL VALUE NOTED.  VALUE IS CONSISTENT WITH PREVIOUSLY REPORTED AND CALLED VALUE. Performed at [facility], [address], [place], [place] [address]    Culture ENTEROCOCCUS FAECALIS ESCHERICHIA COLI  (A)  Final   Report Status PENDING  Incomplete  Culture, blood (routine x 2) Call MD if unable to obtain prior to antibiotics being given     Status: Abnormal   Collection Time: 06/30/24  9:01 PM   Specimen: BLOOD  Result Value Ref Range Status   Specimen Description   Final    BLOOD BLOOD LEFT HAND Performed at [facility], [address], [place], [place] [address]    Special Requests   Final    BOTTLES DRAWN AEROBIC ONLY Blood Culture results may not be optimal due to an inadequate volume of blood received in culture bottles  Performed at [facility], [address] [place], [place] [address]    Culture  Setup Time   Final    GRAM POSITIVE COCCI IN CHAINS AEROBIC BOTTLE ONLY CRITICAL RESULT CALLED TO, READ BACK BY AND VERIFIED WITH: PHARMD ABBY BETHENE [id number] AT 1457 BY EC Performed at [facility], [address], [place], [place] [address]    Culture ENTEROCOCCUS FAECALIS (A)  Final   Report Status 07/03/2024 FINAL  Final   Organism ID, Bacteria ENTEROCOCCUS FAECALIS  Final      Susceptibility   Enterococcus faecalis - MIC*    AMPICILLIN  <=2 SENSITIVE Sensitive     VANCOMYCIN  1 SENSITIVE Sensitive     GENTAMICIN SYNERGY RESISTANT Resistant     * ENTEROCOCCUS FAECALIS  Blood Culture ID Panel (Reflexed)     Status: Abnormal   Collection Time: 06/30/24  9:01 PM  Result Value Ref Range Status   Enterococcus faecalis DETECTED (A) NOT DETECTED Final    Comment: CRITICAL RESULT CALLED TO, READ BACK BY AND VERIFIED WITH: PHARMD ABBY BETHENE [id number] AT 1457 BY EC    Enterococcus  Faecium NOT DETECTED NOT DETECTED Final   Listeria monocytogenes NOT DETECTED NOT DETECTED Final   Staphylococcus species NOT DETECTED NOT DETECTED Final   Staphylococcus aureus (BCID) NOT DETECTED NOT DETECTED Final   Staphylococcus epidermidis NOT DETECTED NOT DETECTED Final   Staphylococcus lugdunensis NOT DETECTED NOT DETECTED Final   Streptococcus species NOT DETECTED NOT DETECTED Final   Streptococcus agalactiae NOT DETECTED NOT DETECTED Final   Streptococcus pneumoniae NOT DETECTED NOT DETECTED Final   Streptococcus pyogenes NOT DETECTED NOT DETECTED Final   A.calcoaceticus-baumannii NOT DETECTED NOT DETECTED Final   Bacteroides fragilis NOT DETECTED NOT DETECTED Final   Enterobacterales NOT DETECTED NOT DETECTED Final   Enterobacter cloacae complex NOT DETECTED NOT DETECTED Final   Escherichia coli NOT DETECTED NOT DETECTED Final   Klebsiella aerogenes NOT DETECTED NOT DETECTED Final   Klebsiella oxytoca NOT DETECTED NOT DETECTED Final   Klebsiella pneumoniae NOT DETECTED NOT DETECTED Final   Proteus species NOT DETECTED NOT DETECTED Final   Salmonella species NOT DETECTED NOT DETECTED Final   Serratia marcescens NOT DETECTED NOT DETECTED Final   Haemophilus influenzae NOT DETECTED NOT DETECTED Final   Neisseria meningitidis NOT DETECTED NOT DETECTED Final   Pseudomonas aeruginosa NOT DETECTED NOT DETECTED Final   Stenotrophomonas maltophilia NOT DETECTED NOT DETECTED Final   Candida albicans NOT DETECTED NOT DETECTED Final   Candida auris NOT DETECTED NOT DETECTED Final   Candida glabrata NOT DETECTED NOT DETECTED Final   Candida krusei NOT DETECTED NOT DETECTED Final   Candida parapsilosis NOT DETECTED NOT DETECTED Final   Candida tropicalis NOT DETECTED NOT DETECTED Final   Cryptococcus neoformans/gattii NOT DETECTED NOT DETECTED Final   Vancomycin  resistance NOT DETECTED NOT DETECTED Final    Comment: Performed at [facility], [address] [place], [place]  [address]  Expectorated Sputum Assessment w Gram Stain, Rflx to Resp Cult     Status: None   Collection Time: 06/30/24 10:51 PM   Specimen: Expectorated Sputum  Result Value Ref Range Status   Specimen Description EXPECTORATED SPUTUM  Final   Special Requests NONE  Final   Sputum evaluation   Final    Sputum specimen not acceptable for testing.  Please recollect.   NOTIFIED JEREMY, RN @2326  ON 06/30/24 NT. Performed at [facility], [address] [place], [place] [address]  Report Status 06/30/2024 FINAL  Final  Expectorated Sputum Assessment w Gram Stain, Rflx to Resp Cult     Status: None   Collection Time: 07/01/24  2:45 AM   Specimen: SPU  Result Value Ref Range Status   Specimen Description SPUTUM  Final   Special Requests NONE  Final   Sputum evaluation   Final    Sputum specimen not acceptable for testing.  Please recollect.   NOTIFIED JEREMY RN OF RECOLLECT @ 0413 ON 07/01/2024 BY MTA Performed at [facility], [address], [place], [place] [address]    Report Status 07/01/2024 FINAL  Final  C Difficile Quick Screen w PCR reflex     Status: None   Collection Time: 07/01/24  8:15 AM   Specimen: STOOL  Result Value Ref Range Status   C Diff antigen NEGATIVE NEGATIVE Final   C Diff toxin NEGATIVE NEGATIVE Final   C Diff interpretation No C. difficile detected.  Final    Comment: Performed at [facility], [address] [place], [place] [address]  Gastrointestinal Panel by PCR , Stool     Status: None   Collection Time: 07/01/24  8:15 AM   Specimen: STOOL  Result Value Ref Range Status   Campylobacter species NOT DETECTED NOT DETECTED Final   Plesimonas shigelloides NOT DETECTED NOT DETECTED Final   Salmonella species NOT DETECTED NOT DETECTED Final   Yersinia enterocolitica NOT DETECTED NOT DETECTED Final   Vibrio species NOT DETECTED NOT DETECTED Final   Vibrio cholerae NOT DETECTED NOT DETECTED Final    Enteroaggregative E coli (EAEC) NOT DETECTED NOT DETECTED Final   Enteropathogenic E coli (EPEC) NOT DETECTED NOT DETECTED Final   Enterotoxigenic E coli (ETEC) NOT DETECTED NOT DETECTED Final   Shiga like toxin producing E coli (STEC) NOT DETECTED NOT DETECTED Final   Shigella/Enteroinvasive E coli (EIEC) NOT DETECTED NOT DETECTED Final   Cryptosporidium NOT DETECTED NOT DETECTED Final   Cyclospora cayetanensis NOT DETECTED NOT DETECTED Final   Entamoeba histolytica NOT DETECTED NOT DETECTED Final   Giardia lamblia NOT DETECTED NOT DETECTED Final   Adenovirus F40/41 NOT DETECTED NOT DETECTED Final   Astrovirus NOT DETECTED NOT DETECTED Final   Norovirus GI/GII NOT DETECTED NOT DETECTED Final   Rotavirus A NOT DETECTED NOT DETECTED Final   Sapovirus (I, II, IV, and V) NOT DETECTED NOT DETECTED Final    Comment: Performed at [facility], [address] [place], [place] [address]  MRSA Next Gen by PCR, Nasal     Status: None   Collection Time: 07/01/24  5:44 PM   Specimen: Nasal Mucosa; Nasal Swab  Result Value Ref Range Status   MRSA by PCR Next Gen NOT DETECTED NOT DETECTED Final    Comment: (NOTE) The GeneXpert MRSA Assay (FDA approved for NASAL specimens only), is one component of a comprehensive MRSA colonization surveillance program. It is not intended to diagnose MRSA infection nor to guide or monitor treatment for MRSA infections. Test performance is not FDA approved in patients less than 57 years old. Performed at [facility], [address] [place], [place] [address]   Culture, blood (Routine X 2) w Reflex to ID Panel     Status: None (Preliminary result)   Collection Time: 07/02/24  4:54 AM   Specimen: BLOOD RIGHT HAND  Result Value Ref Range Status   Specimen Description   Final    BLOOD RIGHT HAND Performed at [facility], [address], [place], [place] [address]    Special Requests   Final    BOTTLES DRAWN AEROBIC AND  ANAEROBIC Blood Culture adequate volume Performed at [facility], [address], [place], [place] [address]    Culture   Final    NO GROWTH < 24 HOURS Performed at [facility], [address], [place], [place] [address]    Report Status PENDING  Incomplete  Culture, blood (Routine X 2) w Reflex to ID Panel     Status: None (Preliminary result)   Collection Time: 07/02/24  4:54 AM   Specimen: BLOOD RIGHT HAND  Result Value Ref Range Status   Specimen Description   Final    BLOOD RIGHT HAND Performed at [facility], [address], [place], [place] [address]    Special Requests   Final    BOTTLES DRAWN AEROBIC AND ANAEROBIC Blood Culture adequate volume Performed at [facility], [address] [place], [place] [address]    Culture   Final    NO GROWTH < 24 HOURS Performed at [facility], [address] [place], [place] [address]    Report Status PENDING  Incomplete         Radiology Studies: ECHOCARDIOGRAM COMPLETE Result Date: 07/02/2024    ECHOCARDIOGRAM REPORT   Patient Name:   Gaetan Spieker. Date of Exam: 07/02/2024 Medical Rec #:  [id number]             Height:       72.0 in Accession #:    [id number]            Weight:       188.7 lb Date of Birth:  1941/09/19             BSA:          2.079 m Patient Age:    83 years              BP:           129/30 mmHg Patient Gender: M                     HR:           73 bpm. Exam Location:  Inpatient Procedure: 2D Echo, 3D Echo, Color Doppler, Cardiac Doppler, Intracardiac            Opacification Agent and Strain Analysis (Both Spectral and Color Flow            Doppler were utilized during procedure). Indications:    Bacteremia  History:        Patient has prior history of Echocardiogram examinations, most                 recent 06/09/2024. CHF, Stroke; Risk Factors:Hypertension and                 Dyslipidemia. Cancer, GERD.  Sonographer:    Logan Shove RDCS Referring  Phys: [id number] [facility]  Sonographer Comments: Suboptimal parasternal window. IMPRESSIONS  1. Left ventricular ejection fraction, by estimation, is 45 to 50%. The left ventricle has mildly decreased function. The left ventricle has no regional wall motion abnormalities. There is mild asymmetric left ventricular hypertrophy of the basal-septal  segment. Left ventricular diastolic parameters are indeterminate.  2. Right ventricular systolic function is normal. The right ventricular size is normal. Tricuspid regurgitation signal is inadequate for assessing PA pressure.  3. The mitral valve is grossly normal. Mild to moderate mitral valve regurgitation. No evidence of mitral stenosis.  4. Caclified aortic valve, possibly bicuspid. The aortic valve is calcified. Aortic valve regurgitation is mild to moderate. Aortic valve sclerosis/calcification is present, without any evidence of aortic stenosis.  5. The inferior vena cava is normal in size with greater than 50% respiratory variability, suggesting right atrial pressure of 3 mmHg. Comparison(s): No significant change from prior study. Conclusion(s)/Recommendation(s): No evidence of valvular vegetations on this transthoracic echocardiogram. Consider a transesophageal echocardiogram to exclude infective endocarditis if clinically indicated. FINDINGS  Left Ventricle: Left ventricular ejection fraction, by estimation, is 45 to 50%. The left ventricle has mildly decreased function. The left ventricle has no regional wall motion abnormalities. The left ventricular internal cavity size was normal in size. There is mild asymmetric left ventricular hypertrophy of the basal-septal segment. Left ventricular diastolic parameters are indeterminate. Right Ventricle: The right ventricular size is normal. No increase in right ventricular wall thickness. Right ventricular systolic function is normal. Tricuspid regurgitation signal is inadequate for assessing PA pressure. Left  Atrium: Left atrial size was normal in size. Right Atrium: Right atrial size was normal in size. Pericardium: There is no evidence of pericardial effusion. Mitral Valve: The mitral valve is grossly normal. Mild to moderate mitral valve regurgitation. No evidence of mitral valve stenosis. Tricuspid Valve: The tricuspid valve is grossly normal. Tricuspid valve regurgitation is trivial. No evidence of tricuspid stenosis. Aortic Valve: Caclified aortic valve, possibly bicuspid. The aortic valve is calcified. Aortic valve regurgitation is mild to moderate. Aortic regurgitation PHT measures 277 msec. Aortic valve sclerosis/calcification is present, without any evidence of aortic stenosis. Aortic valve peak gradient measures 16.2 mmHg. Pulmonic Valve: The pulmonic valve was grossly normal. Pulmonic valve regurgitation is trivial. No evidence of pulmonic stenosis. Aorta: The aortic root is normal in size and structure. Venous: The inferior vena cava is normal in size with greater than 50% respiratory variability, suggesting right atrial pressure of 3 mmHg. IAS/Shunts: The atrial septum is grossly normal.  LEFT VENTRICLE PLAX 2D LVIDd:         6.00 cm      Diastology LVIDs:         4.00 cm      LV e' medial:    6.31 cm/s LV PW:         1.00 cm      LV E/e' medial:  10.7 LV IVS:        1.20 cm      LV e' lateral:   14.10 cm/s LVOT diam:     2.40 cm      LV E/e' lateral: 4.8 LVOT Area:     4.52 cm  LV Volumes (MOD) LV vol d, MOD A2C: 285.0 ml 3D Volume EF: LV vol d, MOD A4C: 310.0 ml 3D EF:        55 % LV vol s, MOD A2C: 144.0 ml LV EDV:       225 ml LV vol s, MOD A4C: 139.0 ml LV ESV:       101 ml LV SV MOD A2C:     141.0 ml LV SV:        124 ml LV SV MOD A4C:     310.0 ml LV SV MOD BP:      156.5 ml RIGHT VENTRICLE             IVC RV Basal diam:  4.30 cm  IVC diam: 1.50 cm RV Mid diam:    2.40 cm RV S prime:     17.80 cm/s TAPSE (M-mode): 2.9 cm LEFT ATRIUM              Index        RIGHT ATRIUM           Index LA diam:         3.70 cm  1.78 cm/m   RA Area:     13.70 cm LA Vol (A2C):   113.0 ml 54.36 ml/m  RA Volume:   30.20 ml  14.53 ml/m LA Vol (A4C):   60.7 ml  29.20 ml/m LA Biplane Vol: 83.7 ml  40.27 ml/m  AORTIC VALVE AV Area (Vmax): 2.24 cm AV Vmax:        201.00 cm/s AV Peak Grad:   16.2 mmHg LVOT Vmax:      99.50 cm/s AI PHT:         277 msec  AORTA Ao Root diam: 3.90 cm MITRAL VALVE MV Area (PHT): 3.46 cm       SHUNTS MV Decel Time: 219 msec       Systemic Diam: 2.40 cm MR Peak grad:    106.9 mmHg MR Mean grad:    71.0 mmHg MR Vmax:         517.00 cm/s MR Vmean:        392.0 cm/s MR PISA:         2.26 cm MR PISA Eff ROA: 17 mm MR PISA Radius:  0.60 cm MV E velocity: 67.30 cm/s MV A velocity: 58.50 cm/s MV E/A ratio:  1.15 Darryle Decent MD Electronically signed by Darryle Decent MD Signature Date/Time: 07/02/2024/11:29:01 AM    Final    DG CHEST PORT 1 VIEW Result Date: 07/02/2024 CLINICAL DATA:  Hypoxia, neutropenic fever EXAM: PORTABLE CHEST 1 VIEW COMPARISON:  07/01/2024 FINDINGS: Port in the anterior chest wall with tip in distal SVC. Normal cardiac silhouette. Bibasilar airspace disease. Airspace disease in the RIGHT lower lobe is more dense than the LEFT. No interval change. IMPRESSION: Bibasilar airspace disease, RIGHT greater than LEFT. No interval change. Electronically Signed   By: Jackquline Boxer M.D.   On: 07/02/2024 10:16        Scheduled Meds:  amiodarone   200 mg Oral Daily   atorvastatin   40 mg Oral Daily   budesonide  (PULMICORT ) nebulizer solution  0.5 mg Nebulization BID   Chlorhexidine  Gluconate Cloth  6 each Topical Daily   cholecalciferol   1,000 Units Oral Daily   clopidogrel   75 mg Oral Q breakfast   dorzolamide -timolol   1 drop Both Eyes Daily   guaiFENesin   1,200 mg Oral BID   insulin  aspart  0-9 Units Subcutaneous TID WC   ipratropium  0.5 mg Nebulization BID   levalbuterol   0.63 mg Nebulization BID   metoprolol  succinate  12.5 mg Oral Daily   oxyCODONE   10 mg Oral Q12H    pantoprazole  (PROTONIX ) IV  40 mg Intravenous Q12H   phosphorus  250 mg Oral BID   rOPINIRole   1 mg Oral TID   sacubitril -valsartan   1 tablet Oral BID   sodium chloride  flush  10-40 mL Intracatheter Q12H   Continuous Infusions:  ampicillin  (OMNIPEN) IV Stopped (07/03/24 1139)   ceFEPime  (MAXIPIME ) IV Stopped (07/03/24 0932)   potassium chloride  100 mL/hr at 07/03/24 1411     LOS: 3 days    Time spent: 45 minutes    Toribio Hummer, MD  [facility]   To contact the attending provider between 7A-7P or the covering provider during after hours 7P-7A, please log into the web site [email or web address] and access using universal [place] password for that web site. If you do not have the password, please call the hospital operator.  07/03/2024, 2:20 PM

## 2024-07-04 ENCOUNTER — Encounter: Payer: Self-pay | Admitting: Physician Assistant

## 2024-07-04 ENCOUNTER — Encounter: Admitting: Nurse Practitioner

## 2024-07-04 ENCOUNTER — Telehealth: Payer: Self-pay | Admitting: Family Medicine

## 2024-07-04 ENCOUNTER — Encounter: Payer: Self-pay | Admitting: Hematology and Oncology

## 2024-07-04 LAB — BPAM RBC
Blood Product Expiration Date: 202509132359
Blood Product Expiration Date: 202509132359
ISSUE DATE / TIME: 202508151207
ISSUE DATE / TIME: 202508151600
Unit Type and Rh: 5100
Unit Type and Rh: 5100

## 2024-07-04 LAB — PATHOLOGIST SMEAR REVIEW

## 2024-07-04 LAB — BPAM PLATELET PHERESIS
Blood Product Expiration Date: 202508172359
ISSUE DATE / TIME: 202508151900
Unit Type and Rh: 5100

## 2024-07-04 LAB — TYPE AND SCREEN
ABO/RH(D): O POS
Antibody Screen: NEGATIVE
Unit division: 0
Unit division: 0

## 2024-07-04 LAB — PREPARE PLATELET PHERESIS: Unit division: 0

## 2024-07-04 NOTE — Telephone Encounter (Signed)
 I am happy to be the attending physician for hospice but certainly willing to lean on the hospice team for recommendations-please let the family know that the patient and the family are in our thoughts-I know this is a challenging time

## 2024-07-04 NOTE — Telephone Encounter (Signed)
 Copied from CRM #[id number]. Topic: General - Other >> Jul 04, 2024  8:31 AM Aleatha BROCKS wrote: Reason for CRM: Samatha from [facility] calling to see if Dr Katrinka wanted to be the attendant Doctor for patient or would they have to use their own physican please call to confirm @ [phone]

## 2024-07-05 LAB — CULTURE, BLOOD (ROUTINE X 2): Special Requests: ADEQUATE

## 2024-07-05 NOTE — Telephone Encounter (Signed)
 Spoke with Lucie with Hospice care and made her aware Dr. Katrinka would be the attending provider but he would also like input from the hospice team.

## 2024-07-07 ENCOUNTER — Encounter: Payer: Self-pay | Admitting: Hematology and Oncology

## 2024-07-07 LAB — CULTURE, BLOOD (ROUTINE X 2)
Culture: NO GROWTH
Culture: NO GROWTH
Special Requests: ADEQUATE
Special Requests: ADEQUATE

## 2024-07-08 ENCOUNTER — Encounter: Payer: Self-pay | Admitting: Physician Assistant

## 2024-07-11 ENCOUNTER — Ambulatory Visit

## 2024-07-11 ENCOUNTER — Ambulatory Visit
Admission: RE | Admit: 2024-07-11 | Discharge: 2024-07-11 | Disposition: A | Discharge status: Home / Self Care | Source: Ambulatory Visit | Attending: Radiation Oncology | Admitting: Radiation Oncology

## 2024-07-11 DIAGNOSIS — C7951 Secondary malignant neoplasm of bone: Secondary | ICD-10-CM | POA: Insufficient documentation

## 2024-07-12 ENCOUNTER — Other Ambulatory Visit: Payer: Self-pay

## 2024-07-12 ENCOUNTER — Encounter (HOSPITAL_COMMUNITY): Payer: Self-pay | Admitting: Hematology and Oncology

## 2024-07-12 NOTE — Progress Notes (Signed)
 Patient has transitioned to hospice care, palliative referral closed at this time.

## 2024-07-13 NOTE — Progress Notes (Signed)
  Radiation Oncology         [phone]) [phone] ________________________________  Name: Joshua Compton. MRN: [id number]  Date of Service: 07/11/2024  DOB: May 01, 1941  Post Treatment Telephone Note  Diagnosis:  Metastatic adenocarcinoma of Unknown Primary site   The patient's daughter was available for the call today.  The patient's daughter did note fatigue during radiation. Skin changes noted in the field of radiation during therapy. There has not been an improvement in pain in the area(s) treated with radiation. Patient developed bone pain in the hips and legs, he is currently receiving pain medications to control the pain. He is coughing up blood clots. The patient is not taking dexamethasone . The patient has symptoms of  weakness or loss of control of the extremities. Patient needs assistance with activities of daily living. Daughter says her father is bed bound, fatigued and weak. The patient does not have symptoms of headache. The patient does not have symptoms of seizure or uncontrolled movement. The patient does not have symptoms of changes in vision. The patient does not have changes in speech. The patient does have some confusion. Patient's daughter is his caregiver and takes care of him  Recently said [name] was put in hospice care and currently are managing his pain with oral liquid morphine . He is on 4 Liters of oxygen . Offered emotional support and to call our office for any questions or concerns.

## 2024-07-15 ENCOUNTER — Ambulatory Visit: Admitting: Physician Assistant

## 2024-07-15 ENCOUNTER — Other Ambulatory Visit

## 2024-07-15 ENCOUNTER — Ambulatory Visit

## 2024-07-19 ENCOUNTER — Ambulatory Visit

## 2024-07-20 ENCOUNTER — Telehealth: Payer: Self-pay | Admitting: Family Medicine

## 2024-07-20 ENCOUNTER — Encounter: Payer: Self-pay | Admitting: Physician Assistant

## 2024-07-21 ENCOUNTER — Telehealth: Payer: Self-pay | Admitting: Family Medicine

## 2024-07-21 NOTE — Telephone Encounter (Signed)
 Spoke with Stage manager at [employer] [facility]. She will be sending the death certificate to Dr. Katrinka to sign. No further questions at this time.    Copied from CRM [id number]. Topic: General - Deceased Patient >> 29-Jul-2024 12:36 PM Rosina BIRCH wrote: Name of caller: pearl from [employer] [facility] want to know who to send the deaf certificate to be signed  Date of death: 06-27-2024   Name of funeral home: [facility]   Phone number of funeral home: [phone]  Provider that needs to sign form: MD Katrinka  Timeline for signing:

## 2024-07-22 ENCOUNTER — Ambulatory Visit

## 2024-07-27 ENCOUNTER — Encounter (HOSPITAL_BASED_OUTPATIENT_CLINIC_OR_DEPARTMENT_OTHER): Admitting: Internal Medicine

## 2024-08-03 ENCOUNTER — Telehealth: Payer: Self-pay | Admitting: Family Medicine

## 2024-08-03 NOTE — Telephone Encounter (Signed)
 Spoke with Dr. Katrinka and he stated he signed and processed the death certificate. He will go back in and review once again.    Copied from CRM [id number]. Topic: General - Deceased Patient >> 2024/08/19 12:36 PM Rosina BIRCH wrote: Name of caller: pearl from [employer] [facility] want to know who to send the deaf certificate to be signed  Date of death: 07/18/2024   Name of funeral home: [facility]   Phone number of funeral home: [phone]  Provider that needs to sign form: MD Katrinka  Timeline for signing: >> Aug 03, 2024 11:22 AM Logan F wrote: St[place] Ruth from [facility] in [place] is calling to get a signature on the death certificate for pt. Please call [phone] Case ID# [id number]

## 2024-08-05 ENCOUNTER — Other Ambulatory Visit

## 2024-08-05 ENCOUNTER — Ambulatory Visit

## 2024-08-05 ENCOUNTER — Ambulatory Visit: Admitting: Physician Assistant

## 2024-08-08 ENCOUNTER — Ambulatory Visit

## 2024-08-08 NOTE — Telephone Encounter (Unsigned)
 Copied from CRM [id number]. Topic: General - Other >> Aug 08, 2024 10:35 AM Robinson H wrote: Reason for CRM: Pearl with [facility] states they received the signed death certificate but it wasn't date and needs to be dated in order to process case number [id number], Online so office can access  [place] [phone]

## 2024-08-08 NOTE — Telephone Encounter (Signed)
 I completed this originally on the date I received it.  I went back in and it is giving me a yellow flag on reason for death-it seems to be unwilling to except unknown origin which is the truth about this cancer.  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 I am unable to make any further changes

## 2024-08-08 NOTE — Telephone Encounter (Signed)
 Please contact funeral home

## 2024-08-09 NOTE — Telephone Encounter (Signed)
 Called [facility] to speak to Crouse but she was not in. Left message for her to return call about message per Dr Katrinka.

## 2024-08-10 ENCOUNTER — Telehealth: Payer: Self-pay | Admitting: Family Medicine

## 2024-08-10 NOTE — Telephone Encounter (Signed)
 Copied from CRM #[id number]. Topic: General - Other >> Aug 10, 2024  9:33 AM Burnard DEL wrote: Reason for CRM: Pearl from [facility] [facility] called stating that the death certificated for patient needs to be dated at [employer] I  order for it to be processed.

## 2024-08-10 NOTE — Telephone Encounter (Signed)
 I called the state because it would not let me certified over the course of multiple days and multiple attempts.  They stated they simply had a place override for reason of death.  I listed this as lung cancer but we know it was an unknown cause but it would not except that so please except my apologies about the lung cancer diagnosis final listing

## 2024-08-11 ENCOUNTER — Ambulatory Visit: Admitting: Student

## 2024-08-17 NOTE — Telephone Encounter (Signed)
 Please review message and advise.    Copied from CRM #[id number]. Topic: General - Deceased Patient >> 2024-07-25  9:14 AM Tiffini S wrote: Name of caller: Joshua Compton with  [facility] [phone]   Date of death: 25-Jul-2024 at 9:09am     Name of funeral home:  [facility]  Phone number of funeral home:  [phone]  Provider that needs to sign form: Please sign death certificate   Timeline for signing: -

## 2024-08-17 DEATH — deceased

## 2024-08-26 ENCOUNTER — Ambulatory Visit

## 2024-08-26 ENCOUNTER — Other Ambulatory Visit

## 2024-08-26 ENCOUNTER — Ambulatory Visit: Admitting: Physician Assistant

## 2024-08-29 ENCOUNTER — Ambulatory Visit

## 2024-09-21 ENCOUNTER — Ambulatory Visit: Payer: Self-pay | Admitting: Family Medicine

## 2024-09-21 NOTE — Telephone Encounter (Signed)
 Copied from CRM [id number]. Topic: Clinical - Home Health Verbal Orders >> Sep 23, 2023 12:42 PM Lennie Ra H wrote: Reason for CRM: verbal orders for speech therapy

## 2024-09-21 NOTE — Telephone Encounter (Signed)
 Copied from CRM [id number]. Topic: Clinical - Home Health Verbal Orders >> Sep 23, 2023 12:42 PM Adelina Mings wrote: Reason for CRM: verbal orders for speech therapy >> Sep 28, 2023  9:46 AM [name] wrote: Please notify home health of verbal order for speech therapy.

## 2024-12-06 ENCOUNTER — Encounter: Admitting: Family Medicine

## 2024-12-09 ENCOUNTER — Encounter: Admitting: Family Medicine
